# Patient Record
Sex: Female | Born: 1935 | Race: White | Hispanic: No | Smoking: Former smoker
Health system: Southern US, Community
[De-identification: ages and names within clinical notes are randomized; demographics above are authoritative.]

## PROBLEM LIST (undated history)

## (undated) DIAGNOSIS — I4819 Other persistent atrial fibrillation: Principal | ICD-10-CM

## (undated) DIAGNOSIS — I4821 Permanent atrial fibrillation: Secondary | ICD-10-CM

## (undated) DIAGNOSIS — I10 Essential (primary) hypertension: Secondary | ICD-10-CM

## (undated) DIAGNOSIS — D649 Anemia, unspecified: Secondary | ICD-10-CM

## (undated) DIAGNOSIS — K559 Vascular disorder of intestine, unspecified: Secondary | ICD-10-CM

## (undated) DIAGNOSIS — M549 Dorsalgia, unspecified: Secondary | ICD-10-CM

## (undated) DIAGNOSIS — H3530 Unspecified macular degeneration: Secondary | ICD-10-CM

## (undated) HISTORY — DX: Dorsalgia, unspecified: M54.9

## (undated) HISTORY — DX: Permanent atrial fibrillation: I48.21

## (undated) HISTORY — DX: Essential (primary) hypertension: I10

## (undated) HISTORY — PX: TOTAL ABDOMINAL HYSTERECTOMY: SHX209

## (undated) HISTORY — DX: Anemia, unspecified: D64.9

## (undated) HISTORY — DX: Other persistent atrial fibrillation: I48.19

## (undated) HISTORY — DX: Vascular disorder of intestine, unspecified: K55.9

## (undated) HISTORY — DX: Unspecified macular degeneration: H35.30

## (undated) HISTORY — PX: LAPAROSCOPIC SMALL BOWEL RESECTION: SUR793

---

## 1999-11-07 ENCOUNTER — Encounter: Payer: Self-pay | Admitting: Emergency Medicine

## 1999-11-07 ENCOUNTER — Emergency Department (HOSPITAL_COMMUNITY): Admission: EM | Admit: 1999-11-07 | Discharge: 1999-11-07 | Payer: Self-pay | Admitting: Emergency Medicine

## 2003-08-22 ENCOUNTER — Ambulatory Visit (HOSPITAL_COMMUNITY): Admission: RE | Admit: 2003-08-22 | Discharge: 2003-08-22 | Payer: Self-pay | Admitting: Family Medicine

## 2005-02-14 ENCOUNTER — Encounter: Admission: RE | Admit: 2005-02-14 | Discharge: 2005-02-14 | Payer: Self-pay | Admitting: Orthopaedic Surgery

## 2005-02-15 ENCOUNTER — Ambulatory Visit (HOSPITAL_BASED_OUTPATIENT_CLINIC_OR_DEPARTMENT_OTHER): Admission: RE | Admit: 2005-02-15 | Discharge: 2005-02-15 | Payer: Self-pay | Admitting: Orthopaedic Surgery

## 2005-02-15 ENCOUNTER — Ambulatory Visit (HOSPITAL_COMMUNITY): Admission: RE | Admit: 2005-02-15 | Discharge: 2005-02-15 | Payer: Self-pay | Admitting: Orthopaedic Surgery

## 2005-07-29 ENCOUNTER — Ambulatory Visit: Payer: Self-pay | Admitting: Internal Medicine

## 2007-06-04 ENCOUNTER — Emergency Department: Payer: Self-pay | Admitting: Emergency Medicine

## 2007-08-20 ENCOUNTER — Ambulatory Visit: Payer: Self-pay | Admitting: Internal Medicine

## 2007-08-25 ENCOUNTER — Ambulatory Visit: Payer: Self-pay

## 2008-10-31 ENCOUNTER — Ambulatory Visit: Payer: Self-pay | Admitting: Podiatry

## 2009-05-20 ENCOUNTER — Ambulatory Visit: Payer: Self-pay | Admitting: Family Medicine

## 2009-05-20 DIAGNOSIS — R5381 Other malaise: Secondary | ICD-10-CM

## 2009-05-20 DIAGNOSIS — R197 Diarrhea, unspecified: Secondary | ICD-10-CM

## 2009-05-20 DIAGNOSIS — F438 Other reactions to severe stress: Secondary | ICD-10-CM

## 2009-05-20 DIAGNOSIS — R55 Syncope and collapse: Secondary | ICD-10-CM | POA: Insufficient documentation

## 2009-05-20 DIAGNOSIS — R5383 Other fatigue: Secondary | ICD-10-CM

## 2010-02-10 ENCOUNTER — Telehealth (INDEPENDENT_AMBULATORY_CARE_PROVIDER_SITE_OTHER): Payer: Self-pay | Admitting: *Deleted

## 2010-05-10 ENCOUNTER — Ambulatory Visit: Payer: Self-pay | Admitting: Family Medicine

## 2010-05-11 LAB — CONVERTED CEMR LAB
ALT: 14 units/L (ref 0–35)
AST: 19 units/L (ref 0–37)
Albumin: 3.7 g/dL (ref 3.5–5.2)
Alkaline Phosphatase: 71 units/L (ref 39–117)
BUN: 14 mg/dL (ref 6–23)
Basophils Absolute: 0 10*3/uL (ref 0.0–0.1)
Basophils Relative: 0.4 % (ref 0.0–3.0)
Bilirubin, Direct: 0.1 mg/dL (ref 0.0–0.3)
CO2: 28 meq/L (ref 19–32)
Calcium: 8.9 mg/dL (ref 8.4–10.5)
Chloride: 108 meq/L (ref 96–112)
Cholesterol: 176 mg/dL (ref 0–200)
Creatinine, Ser: 0.9 mg/dL (ref 0.4–1.2)
Eosinophils Absolute: 0.2 10*3/uL (ref 0.0–0.7)
Eosinophils Relative: 2.9 % (ref 0.0–5.0)
GFR calc non Af Amer: 62.66 mL/min (ref >60)
Glucose, Bld: 102 mg/dL — ABNORMAL HIGH (ref 70–99)
HCT: 37.8 % (ref 36.0–46.0)
HDL: 63.7 mg/dL (ref >39.00)
Hemoglobin: 12.7 g/dL (ref 12.0–15.0)
LDL Cholesterol: 103 mg/dL — ABNORMAL HIGH (ref 0–99)
Lymphocytes Relative: 33.8 % (ref 12.0–46.0)
Lymphs Abs: 2 10*3/uL (ref 0.7–4.0)
MCHC: 33.6 g/dL (ref 30.0–36.0)
MCV: 81.2 fL (ref 78.0–100.0)
Monocytes Absolute: 0.6 10*3/uL (ref 0.1–1.0)
Monocytes Relative: 10.2 % (ref 3.0–12.0)
Neutro Abs: 3 10*3/uL (ref 1.4–7.7)
Neutrophils Relative %: 52.7 % (ref 43.0–77.0)
Platelets: 199 10*3/uL (ref 150.0–400.0)
Potassium: 4.4 meq/L (ref 3.5–5.1)
RBC: 4.66 M/uL (ref 3.87–5.11)
RDW: 15 % — ABNORMAL HIGH (ref 11.5–14.6)
Sodium: 145 meq/L (ref 135–145)
TSH: 1.5 microintl units/mL (ref 0.35–5.50)
Total Bilirubin: 0.6 mg/dL (ref 0.3–1.2)
Total CHOL/HDL Ratio: 3
Total Protein: 6.3 g/dL (ref 6.0–8.3)
Triglycerides: 45 mg/dL (ref 0.0–149.0)
VLDL: 9 mg/dL (ref 0.0–40.0)
WBC: 5.8 10*3/uL (ref 4.5–10.5)

## 2010-05-13 ENCOUNTER — Ambulatory Visit: Payer: Self-pay | Admitting: Family Medicine

## 2010-05-13 ENCOUNTER — Encounter (INDEPENDENT_AMBULATORY_CARE_PROVIDER_SITE_OTHER): Payer: Self-pay | Admitting: *Deleted

## 2010-05-13 LAB — CONVERTED CEMR LAB: Fecal Occult Bld: NEGATIVE

## 2010-05-20 ENCOUNTER — Encounter (INDEPENDENT_AMBULATORY_CARE_PROVIDER_SITE_OTHER): Payer: Self-pay | Admitting: *Deleted

## 2010-06-04 ENCOUNTER — Encounter: Payer: Self-pay | Admitting: Family Medicine

## 2010-06-12 LAB — HM DEXA SCAN

## 2010-07-12 ENCOUNTER — Telehealth: Payer: Self-pay | Admitting: Family Medicine

## 2010-08-02 ENCOUNTER — Ambulatory Visit: Payer: Self-pay | Admitting: Family Medicine

## 2010-08-02 DIAGNOSIS — M549 Dorsalgia, unspecified: Secondary | ICD-10-CM | POA: Insufficient documentation

## 2010-10-10 LAB — CONVERTED CEMR LAB
ALT: 12 units/L (ref 0–35)
AST: 20 units/L (ref 0–37)
Albumin: 3.7 g/dL (ref 3.5–5.2)
Alkaline Phosphatase: 81 units/L (ref 39–117)
BUN: 12 mg/dL (ref 6–23)
Basophils Absolute: 0 10*3/uL (ref 0.0–0.1)
Basophils Relative: 0.6 % (ref 0.0–3.0)
Bilirubin, Direct: 0.1 mg/dL (ref 0.0–0.3)
CO2: 31 meq/L (ref 19–32)
Calcium: 9.2 mg/dL (ref 8.4–10.5)
Chloride: 104 meq/L (ref 96–112)
Creatinine, Ser: 0.9 mg/dL (ref 0.4–1.2)
Direct LDL: 101.2 mg/dL
Eosinophils Absolute: 0.2 10*3/uL (ref 0.0–0.7)
Eosinophils Relative: 3 % (ref 0.0–5.0)
GFR calc non Af Amer: 63.61 mL/min (ref >60)
Glucose, Bld: 94 mg/dL (ref 70–99)
HCT: 38.3 % (ref 36.0–46.0)
Hemoglobin: 12.9 g/dL (ref 12.0–15.0)
Lymphocytes Relative: 41.5 % (ref 12.0–46.0)
Lymphs Abs: 3 10*3/uL (ref 0.7–4.0)
MCHC: 33.7 g/dL (ref 30.0–36.0)
MCV: 80.4 fL (ref 78.0–100.0)
Monocytes Absolute: 0.8 10*3/uL (ref 0.1–1.0)
Monocytes Relative: 10.9 % (ref 3.0–12.0)
Neutro Abs: 3.2 10*3/uL (ref 1.4–7.7)
Neutrophils Relative %: 44 % (ref 43.0–77.0)
Platelets: 210 10*3/uL (ref 150.0–400.0)
Potassium: 4 meq/L (ref 3.5–5.1)
RBC: 4.77 M/uL (ref 3.87–5.11)
RDW: 14 % (ref 11.5–14.6)
Sodium: 141 meq/L (ref 135–145)
TSH: 1.21 microintl units/mL (ref 0.35–5.50)
Total Bilirubin: 0.5 mg/dL (ref 0.3–1.2)
Total Protein: 6.8 g/dL (ref 6.0–8.3)
WBC: 7.2 10*3/uL (ref 4.5–10.5)

## 2010-10-12 NOTE — Miscellaneous (Signed)
  Clinical Lists Changes  Orders: Added new Referral order of Dexa Scan (Dexa Scan) - Signed  Appended Document:  DEXA Hannah Beat MD  May 20, 2010 10:36 AM    Clinical Lists Changes  Orders: Added new Referral order of Radiology Referral (Radiology) - Signed

## 2010-10-12 NOTE — Assessment & Plan Note (Signed)
Summary: LOWER BACK PAIN CYD   Vital Signs:  Patient profile:   77 year old female Height:      53 inches Weight:      167.50 pounds BMI:     42.08 Temp:     98.5 degrees F oral Pulse rate:   84 / minute Pulse rhythm:   regular BP sitting:   154 / 96  (left arm) Cuff size:   large  Vitals Entered By: Delilah Shan CMA Duncan Dull) (August 02, 2010 2:41 PM) CC: LBP   History of Present Illness: Low back pain.  L lower side. Radiates down to your hip.  No weakness, able to bear weight.  Worse bending over.  No pain reaching up.  No pain twisting.  Standing all day at work, but feels better standing and sitting, but worse after trying to get up out of the chair.  Present intermittently for a year, prev noted when caring for her husband before he died.  Better after taking a hot bath.   Allergies: 1)  ! * Tetanus 2)  ! Fosamax  Social History: Widowed after 64 years working some at [employer] Alcohol use-no Drug use-no Regular exercise-no Active in the [employer]  Review of Systems       See HPI.  Otherwise negative.    Physical Exam  General:  no apparent distress regular rate and rhythm clear to auscultation bilaterally back w/o midline pain L lower paraspinal muscles tender to palpation.  No pain with int/ext rotation of hips.  SLR neg bilaterally   Impression & Recommendations:  Problem # 1:  BACK PAIN (ICD-724.5) Likley muscle strain.  Supportive tx.  follow up as needed.  Okay to use local heat.  No indication for imaging.   Patient Instructions: 1)  I think you have a muscle strain.  I would use the heating pad, but be careful not to get burned.  Take the tylenol as needed and let us know if you aren't getting better.  Take care.  Glad to see you today.    Orders Added: 1)  Est. Patient Level III [52841]    Current Allergies (reviewed today): ! * TETANUS ! FOSAMAX

## 2010-10-12 NOTE — Assessment & Plan Note (Signed)
Summary: CPX/CLE  0 CO-PAY PER [employer]   Vital Signs:  Patient profile:   77 year old female Height:      53 inches Weight:      167.2 pounds BMI:     42.00 Temp:     97.9 degrees F oral Pulse rate:   76 / minute Pulse rhythm:   regular BP sitting:   120 / 78  (left arm) Cuff size:   regular  Vitals Entered By: Benny Lennert CMA Duncan Dull) (May 10, 2010 8:18 AM)  History of Present Illness: Chief complaint cpx   Mammo - had one year ago. declines DEXA scan: ok labs pneumovax: ok zoster, ok Tdap (ALLERGIC) Flu shot: ok FOBT  leg cramps constipated intermittently   dealing fairly well with loss of husband, sad sometimes  Contraindications/Deferment of Procedures/Staging:    Test/Procedure: Zoster vaccine    Reason for deferment: declined     Test/Procedure: Mammogram    Reason for deferment: patient declined     Test/Procedure: Colonoscopy    Reason for deferment: patient declined   Preventive Screening-Counseling & Management  Alcohol-Tobacco     Alcohol Counseling: not indicated; use of alcohol is not excessive or problematic     Smoking Status: quit     Tobacco Counseling: not indicated; no tobacco use  Caffeine-Diet-Exercise     Does Patient Exercise: no     Exercise Counseling: to improve exercise regimen  Hep-HIV-STD-Contraception     HIV Risk: no risk noted     STD Risk: no risk noted      Sexual History:  widowed last year.        Drug Use:  never.    Allergies (verified): 1)  ! * Tetanus  Social History: Married, Angelita Harnack working some at [employer] Alcohol use-no Drug use-no Regular exercise-no Active in the [employer] ChurchHIV Risk:  no risk noted STD Risk:  no risk noted Sexual History:  widowed last year Drug Use:  never Smoking Status:  quit  Review of Systems  General: Denies fever, chills, sweats, anorexia, fatigue, weakness, malaise Eyes: Denies blurring, vision loss ENT: Denies earache, nasal congestion, nosebleeds, sore  throat, and hoarseness.  Cardiovascular: Denies chest pains, palpitations, syncope, dyspnea on exertion,  Respiratory: Denies cough, dyspnea at rest, excessive sputum,wheeezing GI: occ constipation GU: Denies dysuria, hematuria, discharge, urinary frequency, urinary hesitancy, nocturia, incontinence, genital sores, decreased libido Musculoskeletal: Denies back pain, joint pain Derm: Denies rash, itching Neuro: Denies  paresthesias, frequent falls, frequent headaches, and difficulty walking.  Psych: Denies depression, anxiety Endocrine: Denies cold intolerance, heat intolerance, polydipsia, polyphagia, polyuria, and unusual weight change.  Heme: Denies enlarged lymph nodes Allergy: No hayfever   Otherwise, the pertinent positives and negatives are listed above and in the HPI, otherwise a full review of systems has been reviewed and is negative unless noted positive.   Physical Exam  General:  Well-developed,well-nourished,in no acute distress; alert,appropriate and cooperative throughout examination Head:  Normocephalic and atraumatic without obvious abnormalities. No apparent alopecia or balding. Eyes:  pupils equal, pupils round, pupils reactive to light, pupils react to accomodation, corneas and lenses clear, and no injection.   Ears:  External ear exam shows no significant lesions or deformities.  Otoscopic examination reveals clear canals, tympanic membranes are intact bilaterally without bulging, retraction, inflammation or discharge. Hearing is grossly normal bilaterally. Nose:  External nasal examination shows no deformity or inflammation. Nasal mucosa are pink and moist without lesions or exudates. Mouth:  Oral mucosa and oropharynx without  lesions or exudates.  Teeth in good repair. Neck:  No deformities, masses, or tenderness noted. Chest Wall:  No deformities, masses, or tenderness noted. Lungs:  Normal respiratory effort, chest expands symmetrically. Lungs are clear to  auscultation, no crackles or wheezes. Heart:  Normal rate and regular rhythm. S1 and S2 normal without gallop, murmur, click, rub or other extra sounds. Abdomen:  Bowel sounds positive,abdomen soft and non-tender without masses, organomegaly or hernias noted. Msk:  normal ROM.   Pulses:  DP and PT 2+ Extremities:  No clubbing, cyanosis, edema, or deformity noted with normal full range of motion of all joints.   Neurologic:  alert & oriented X3 and gait normal.   Skin:  Intact without suspicious lesions or rashes Cervical Nodes:  No lymphadenopathy noted Psych:  Cognition and judgment appear intact. Alert and cooperative with normal attention span and concentration. No apparent delusions, illusions, hallucinations   Impression & Recommendations:  Problem # 1:  HEALTH MAINTENANCE EXAM (ICD-V70.0) The patient's preventative maintenance and recommended screening tests for an annual wellness exam were reviewed in full today. Brought up to date unless services declined.  Counselled on the importance of diet, exercise, and its role in overall health and mortality. The patient's FH and SH was reviewed, including their home life, tobacco status, and drug and alcohol status.   update all vaccines DEXA scan  Other Orders: Radiology Referral (Radiology) Venipuncture [phone]) TLB-Lipid Panel (80061-LIPID) TLB-BMP (Basic Metabolic Panel-BMET) (80048-METABOL) TLB-CBC Platelet - w/Differential (85025-CBCD) TLB-Hepatic/Liver Function Pnl (80076-HEPATIC) TLB-TSH (Thyroid Stimulating Hormone) (84443-TSH) Flu Vaccine 51yrs + (98119) Administration Flu vaccine - MCR (G0008) Pneumococcal Vaccine (14782) Admin 1st Vaccine (95621) Admin of Any Addtl Vaccine (30865)  Patient Instructions: 1)  CONSTIPATION 2)  Difficult, uncomfortable, infrequent BM 3)  1. Warm prune juice, hot water, tea, coffee, apple juice 4)  2. Prevention: drink 8 glasses water daily, FIBER (raw fruit, veggies, bran cereal, whole  grains), regular exercise 5)  3. Bulk formers like Metamucil (psyllium), Citrucel (methylcellulose) usually help - oR FIBERCON 6)  4. Stool softeners (Docusate) occaisionally OK 7)  5. Occaisional over the counter Miralax usually safe 8)  6. Overuse of stimulant  laxatives (Ex-Lax, Mag Citrate, Dulcolax, etc.) can be habit forming    9)  GENTEAL REWETTING DROPS (FOR EYE DRYNESS) 10)  TRY SOME TONIC WATER AT NIGHT BEFORE BED, IT IS OK TO PUT SOME CRYSTAL LIGHT IN IT FOR TASTE  Current Allergies (reviewed today): ! * TETANUS    Prevention & Chronic Care Immunizations   Influenza vaccine: Fluvax 3+  (05/10/2010)   Influenza vaccine due: 08/19/2008    Tetanus booster: Not documented    Pneumococcal vaccine: Pneumovax (Medicare)  (05/10/2010)    H. zoster vaccine: Not documented   H. zoster vaccine deferral: declined  (05/10/2010)  Colorectal Screening   Hemoccult: Not documented   Hemoccult action/deferral: Ordered  (05/10/2010)    Colonoscopy: Not documented   Colonoscopy action/deferral: patient declined  (05/10/2010)  Other Screening   Pap smear: Not documented    Mammogram: Not documented   Mammogram action/deferral: patient declined  (05/10/2010)    DXA bone density scan: Not documented   DXA bone density action/deferral: Ordered  (05/10/2010)   Smoking status: quit  (05/10/2010)  Lipids   Total Cholesterol: Not documented   Lipid panel action/deferral: Lipid Panel ordered   LDL: Not documented   LDL Direct: 101.2  (05/20/2009)   HDL: Not documented   Triglycerides: Not documented   Nursing Instructions: Give tetanus  booster today Give Pneumovax today Provide Hemoccult cards with instructions (see order) Schedule screening DXA bone density scan (see order)          Flu Vaccine Consent Questions     Do you have a history of severe allergic reactions to this vaccine? no    Any prior history of allergic reactions to egg and/or gelatin? no    Do you  have a sensitivity to the preservative Thimersol? no    Do you have a past history of Guillan-Barre Syndrome? no    Do you currently have an acute febrile illness? no    Have you ever had a severe reaction to latex? no    Vaccine information given and explained to patient? yes    Are you currently pregnant? no    Lot Number:[id number]   Exp Date:03/12/2011   Manufacturer: [employer]    Site Given  Left Deltoid IMflu    Immunizations Administered:  Pneumonia Vaccine:    Vaccine Type: Pneumovax (Medicare)    Site: right deltoid    Mfr: Merck    Dose: 0.5 ml    Route: IM    Given by: Benny Lennert CMA (AAMA)    Exp. Date: 11/17/2011    Lot #: [id number]    VIS given: 04/09/96 version given May 10, 2010.

## 2010-10-12 NOTE — Progress Notes (Signed)
Summary: Can not take Fosamax  Phone Note Call from Patient Call back at [phone]   Caller: Patient Call For: Paula Beat MD Summary of Call: Patient was given Fosamax and she wants to let you know that she can not take this. She took the first dose on Friday and she felt full all day  and was weak and that night she collapsed on the floor and passed out. Today is the first day since taking the medication that she has felt almost normal again. Please advise what she should do. Pharmacy- [facility]/ [address] Initial call taken by: Sydell Axon LPN,  July 12, 2010 1:30 PM  Follow-up for Phone Call        That would be uncommon, but stop that medication.  Be sure to be taking Calcium 1500 units a day and vitamin d 2000 units a day Paula Beat MD  July 12, 2010 2:06 PM   Advised pt's daughter. Follow-up by: Lowella Petties CMA, AAMA,  July 12, 2010 3:12 PM   New Allergies: ! FOSAMAX New Allergies: ! FOSAMAX Prior Medications: ALENDRONATE SODIUM 70 MG TABS (ALENDRONATE SODIUM) 1 by mouth weekly on empty stomach, do not eat at least 30 mins after taking. Current Allergies: ! * TETANUS ! FOSAMAX

## 2010-10-12 NOTE — Letter (Signed)
Summary: [facility]: Immunoassay Fecal Occult Blood (iFOB) Order Form  [place] [facility]  [address] [place], [place] [address]   Phone: [phone]  Fax: [phone]      [facility]: Immunoassay Fecal Occult Blood (iFOB) Order Form   May 10, 2010 MRN: [id number]   Paula Luna 1925-07-17   Physicican Name:________Dr. Copland_________________  Diagnosis Code:__________v76.51 (screening neoplasm, colon)________________      Hannah Beat MD

## 2010-10-12 NOTE — Progress Notes (Signed)
Summary: Call  Phone Note Call from Patient Call back at [phone]   Caller: Patient Call For: [facility] Summary of Call: Patient asks that you return her call.   She wants to talk specifically to you but I'll call her back and insist that she give me the message if you like.  Lugene Fuquay CMA Duncan Dull)  February 10, 2010 4:22 PM   Follow-up for Phone Call        spoke to patient wanted to talk about bill for husband for a surgical shoe Follow-up by: Benny Lennert CMA Duncan Dull),  February 11, 2010 3:03 PM

## 2010-10-12 NOTE — Letter (Signed)
Summary: Results Follow up Letter  [place] [facility]  [address] [place], [place] [address]   Phone: [phone]  Fax: [phone]    05/13/2010 MRN: [id number]     Paula Luna [address] [place], [place]  [address]     Dear Ms. Sem,  The following are the results of your recent test(s):  Test         Result    Pap Smear:        Normal _____  Not Normal _____ Comments: ______________________________________________________ Cholesterol: LDL(Bad cholesterol):         Your goal is less than:         HDL (Good cholesterol):       Your goal is more than: Comments:  ______________________________________________________ Mammogram:        Normal _____  Not Normal _____ Comments:  ___________________________________________________________________ Hemoccult:        Normal __x___  Not normal _______ Comments:  Repeat in 1 year  _____________________________________________________________________ Other Tests:    We routinely do not discuss normal results over the telephone.  If you desire a copy of the results, or you have any questions about this information we can discuss them at your next office visit.   Sincerely,   Hannah Beat MD

## 2011-01-28 NOTE — Op Note (Signed)
NAMEMEILY, GLOWACKI              ACCOUNT NO.:  [id number]   MEDICAL RECORD NO.:  [id number]          PATIENT TYPE:  AMB   LOCATION:  [facility]SC                          FACILITY:  [facility]   PHYSICIAN:  Lubertha Basque. Dalldorf, M.D.DATE OF BIRTH:  [birth year]   DATE OF PROCEDURE:  02/15/2005  DATE OF DISCHARGE:                                 OPERATIVE REPORT   PREOPERATIVE DIAGNOSES:  1. Right shoulder impingement.  2. Right shoulder acromioclavicular arthropathy.  3. Right shoulder rotator cuff tear.   POSTOPERATIVE DIAGNOSES:  1. Right shoulder impingement.  2. Right shoulder acromioclavicular arthropathy.  3. Right shoulder rotator cuff tear.   PROCEDURES:  1. Right shoulder arthroscopic acromioplasty.  2. Right shoulder arthroscopic acromioclavicular resection.  3. Right shoulder arthroscopic debridement of rotator cuff and biceps.   ATTENDING SURGEON:  Lubertha Basque. Jerl Santos, M.D.   ASSISTANT:  Lindwood Qua, P.A.   ANESTHESIA:  General and shoulder block.   INDICATIONS FOR PROCEDURE:  The patient is a 77 year old woman who we have  followed for 3 or 4 years for right shoulder difficulty which stems from an  injury at [employer], where she works.  She has persisted with difficulty  despite multiple subacromial injections, home exercise program, supervised  physical therapy, oral anti-inflammatories, and narcotic pain medicine.  She  underwent an MRI scan about 3 years ago which showed a rotator cuff tear and  things consistent with impingement and AC arthropathy.  She has managed  fairly well with an injection and therapy every few months, but at this  point has pain which is incapacitating.  This makes it difficult for her  rest of night and difficult for her to use her arm for any meaningful  purpose.  She is offered an arthroscopy.  Informed operative consent was  obtained after discussion of possible applications of reaction to anesthesia  and infection.   DESCRIPTION OF  PROCEDURE:  The patient was taken to an operating suite where  a general anesthetic was applied without difficulty.  She was also given a  block in the preanesthesia area.  She was positioned in a beach-chair  position and prepped and draped in normal sterile fashion.  After the  administration of preop IV antibiotic, the right shoulder was addressed with  an arthroscopy through a total of 3 portals.  Glenohumeral joint showed no  degenerative changes, other than a small area on the humeral head about half  the size of a dime, which was grade 3.  Labral structures were intact.  The  biceps tendon had a degenerative area as it exited through the rotator cuff  and this was addressed with a debridement, but it involve only about 10% of  the thickness of this tendon.  The rotator cuff appeared to be abnormal and  at least partially torn from below.  In the subacromial space, she had a  great deal of bursitis addressed with a thorough bursectomy.  She had things  consistent with impingement and an acromioplasty was done with the bur in a  lateral position followed by transfer of the bur to the posterior position.  She also appeared to have bony contact on the rotator cuff under the [facility]  joint and a formal AC decompression was done through the anterior portal.  The rotator cuff was examined and no obvious full-thickness tears could be  seen, though there were several areas of degeneration and significant  partial-thickness tearing.  It was my feeling that there was not one focal  area to repair and that our best measured was to simply perform the  decompression and rehabilitate her shoulder.  After a debridement was  performed, the arthroscopic equipment was removed.  The shoulder was  thoroughly irrigated at the end of the case.  Her portals were loosely  reapproximated with nylon.  Adaptic was applied over the wounds followed by  dry gauze and tape.  Estimated blood loss and intraoperative fluids  can be  obtained from anesthesia records.   DISPOSITION:  The patient was extubated in the operating room and taken to  recovery in stable addition.  Plans were for her to go home the same day and  to follow up in the office in less than a week.  I will contact her by phone  tonight.       PGD/MEDQ  D:  02/15/2005  T:  02/15/2005  Job:  [id number]

## 2011-11-07 ENCOUNTER — Encounter: Payer: Self-pay | Admitting: Family Medicine

## 2011-11-07 ENCOUNTER — Ambulatory Visit (INDEPENDENT_AMBULATORY_CARE_PROVIDER_SITE_OTHER): Payer: BC Managed Care – PPO | Admitting: Family Medicine

## 2011-11-07 DIAGNOSIS — J069 Acute upper respiratory infection, unspecified: Secondary | ICD-10-CM | POA: Insufficient documentation

## 2011-11-07 NOTE — Progress Notes (Signed)
  Subjective:    Patient ID: Paula Luna, female    DOB: Oct 23, 1924, 78 y.o.   MRN: [id number]  HPI CC: congestion  Presents with daughter.  Overall very healthy 78yo with 2d h/o sinus congestion sensation and that cleared with throat lozenges.  Feeling mild congestion in chest.  Has had colds come and go the last 2 weeks so decided she wanted to come be evaluated.  Mild dizziness this morning.  Works at [employer] - cold breeze.  Taking ibuprofen and fisherman's lozenges.  No fevers/chills, PNdrainage, RN, sneezing, headaches, ear pain or gum pain, rashes, abd pain, n/v.  No chest pain, shorness of breath.  No sick contacts at home.  No smokers at home.  No h/o asthma, allergic rhinitis.  bp elevated today - stressed because sister in law is critically ill in [place].  Review of Systems Per HPI    Objective:   Physical Exam  Nursing note and vitals reviewed. Constitutional: She appears well-developed and well-nourished. No distress.  HENT:  Head: Normocephalic and atraumatic.  Right Ear: Hearing, external ear and ear canal normal.  Left Ear: Hearing, external ear and ear canal normal.  Nose: Nose normal. No mucosal edema or rhinorrhea. Right sinus exhibits no maxillary sinus tenderness and no frontal sinus tenderness. Left sinus exhibits no maxillary sinus tenderness and no frontal sinus tenderness.  Mouth/Throat: Uvula is midline, oropharynx is clear and moist and mucous membranes are normal. No oropharyngeal exudate, posterior oropharyngeal edema, posterior oropharyngeal erythema or tonsillar abscesses.       TMs covered by cerumen  Eyes: Conjunctivae and EOM are normal. Pupils are equal, round, and reactive to light. No scleral icterus.  Neck: Normal range of motion. Neck supple.  Cardiovascular: Normal rate, regular rhythm, normal heart sounds and intact distal pulses.   No murmur heard. Pulmonary/Chest: Effort normal and breath sounds normal. No respiratory distress. She has no  wheezes. She has no rales.  Lymphadenopathy:    She has no cervical adenopathy.  Skin: Skin is warm and dry. No rash noted.       Assessment & Plan:

## 2011-11-07 NOTE — Patient Instructions (Addendum)
You are doing well today. You may be getting over a viral infection Push fluids and plenty of rest. Nasal saline irrigation or neti pot to help drain sinuses. May use simple mucinex (or guaifenesin immediate release) with plenty of fluid to help mobilize mucous. Let us know if fever >101.5, trouble opening/closing mouth, difficulty swallowing, or worsening productive cough - you may need to be seen again.

## 2011-11-07 NOTE — Assessment & Plan Note (Signed)
Exam normal today. Advised to keep eye on temperature and if any worsening to let us know.

## 2012-07-12 ENCOUNTER — Ambulatory Visit (INDEPENDENT_AMBULATORY_CARE_PROVIDER_SITE_OTHER): Payer: BC Managed Care – PPO | Admitting: Family Medicine

## 2012-07-12 ENCOUNTER — Encounter: Payer: Self-pay | Admitting: Family Medicine

## 2012-07-12 VITALS — BP 152/82 | HR 76 | Temp 98.2°F | Wt 164.5 lb

## 2012-07-12 DIAGNOSIS — IMO0002 Reserved for concepts with insufficient information to code with codable children: Secondary | ICD-10-CM

## 2012-07-12 DIAGNOSIS — Z23 Encounter for immunization: Secondary | ICD-10-CM

## 2012-07-12 DIAGNOSIS — S76019A Strain of muscle, fascia and tendon of unspecified hip, initial encounter: Secondary | ICD-10-CM

## 2012-07-12 NOTE — Patient Instructions (Addendum)
Hip Rehab:  Hip Flexion: Toe up to ceiling, laying on your back. Lift your whole leg, 3 sets. Work up to being able to do #30 with each set.  Hip elevations, Toe and leg turned out to side.  Lift whole leg, 3 sets. Work up to being able to do #30 with each set.  Hip Abductions: Lying on side, straight out to side. 3 sets, work up to being able to do #30 with each set.  At the beginning you may only be able to do a lot less, try to do #10.  

## 2012-07-12 NOTE — Progress Notes (Signed)
Nature conservation officer at [facility] [address] [place] [address] Phone: [phone] Fax: [phone]  Date:  07/12/2012   Name:  Paula Luna   DOB:  October 12, 1924   MRN:  [id number] Gender: female Age: 78 y.o.  PCP:  Hannah Beat, MD  Evaluating MD: Hannah Beat, MD   Chief Complaint: Groin Pain   History of Present Illness:  Paula Luna is a 78 y.o. pleasant patient who presents with the following:  Larey Seat a couple of weeks ago, granddaughter won a Psychologist, prison and probation services. Paula Luna was on the other side, was walking to side and roled to the side. Now pain on the side, and pain on the side and back. In the groin - more in the back.   R side with pain in the groin and in the posterior buttocks. No numbness, tingling, but pain with walking, standing, moving and she still works at [employer]. (pain)  Patient Active Problem List  Diagnosis  . OTHER ACUTE REACTIONS TO STRESS  . BACK PAIN  . VASOVAGAL SYNCOPE  . FATIGUE  . DIARRHEA  . Viral URI    Past Medical History  Diagnosis Date  . Osteoporosis     Past Surgical History  Procedure Date  . Total abdominal hysterectomy     History  Substance Use Topics  . Smoking status: Former Games developer  . Smokeless tobacco: Not on file  . Alcohol Use: No    No family history on file.  Allergies  Allergen Reactions  . Latex Rash  . Alendronate Sodium     REACTION: passed out  . Tetanus Toxoid     REACTION: anaphylaxis    Medication list has been reviewed and updated.  Outpatient Prescriptions Prior to Visit  Medication Sig Dispense Refill  . ibuprofen (ADVIL,MOTRIN) 200 MG tablet Take 200 mg by mouth as needed.        Review of Systems:   GEN: No fevers, chills. Nontoxic. Primarily MSK c/o today. MSK: Detailed in the HPI GI: tolerating PO intake without difficulty Neuro: No numbness, parasthesias, or tingling associated. Otherwise the pertinent positives of the ROS are noted above.    Physical  Examination: Filed Vitals:   07/12/12 1129  BP: 152/82  Pulse: 76  Temp: 98.2 F (36.8 C)  TempSrc: Oral  Weight: 164 lb 8 oz (74.617 kg)  SpO2: 99%    There is no height on file to calculate BMI. Ideal Body Weight:     GEN: WDWN, NAD, Non-toxic, Alert & Oriented x 3 HEENT: Atraumatic, Normocephalic.  Ears and Nose: No external deformity. EXTR: No clubbing/cyanosis/edema NEURO: antalgic gait.  PSYCH: Normally interactive. Conversant. Not depressed or anxious appearing.  Calm demeanor.   HIP EXAM: SIDE: R ROM: Abduction, Flexion, Internal and External range of motion: full Pain with terminal IROM and EROM: none GTB: NT SLR: NEG Knees: No effusion FABER: NT REVERSE FABER: NT, neg Piriformis: NT at direct palpation Str: flexion: 5/5 abduction: 4-/5 adduction: 3+/5 Abduction and adduction are notably tender     Assessment and Plan:  1. Strain of hip adductor muscle   2. Need for prophylactic vaccination and inoculation against influenza    Reassured. Bone contusion, soft tissue contusion with groin strain and hib abductor discomfort advil 1-2 tabs po q 8 hours ROM and str reviewed  Orders Today:  No orders of the defined types were placed in this encounter.    Updated Medication List: (Includes new medications, updates to list, dose adjustments) No orders of the defined  types were placed in this encounter.    Medications Discontinued: There are no discontinued medications.   Hannah Beat, MD

## 2012-07-16 ENCOUNTER — Telehealth: Payer: Self-pay

## 2012-07-16 ENCOUNTER — Telehealth: Payer: Self-pay | Admitting: Family Medicine

## 2012-07-16 ENCOUNTER — Encounter: Payer: Self-pay | Admitting: Family Medicine

## 2012-07-16 NOTE — Telephone Encounter (Signed)
Note changed.  Spoke with pt and told her note will be waiting for her at front desk.

## 2012-07-16 NOTE — Telephone Encounter (Signed)
Please do work note, may return 07/23/2012.

## 2012-07-16 NOTE — Telephone Encounter (Signed)
Pt seen 07/12/12; went to work 07/13/12 and was in horrible pain. Needs note to be out of work starting 07/14/12 thru whenever Dr Patsy Lager thinks pt could return to work. Pt has more pain in rt groin upon weight bearing(pt walking with cane). Pt has been doing exercises given.Please advise when letter ready for pick up.

## 2012-07-16 NOTE — Telephone Encounter (Signed)
Noted, i think that is reasonable in an 78 year old.  Can you please change the note until 07/30/2012 return to work.

## 2012-07-16 NOTE — Telephone Encounter (Signed)
Pt is calling back to say that she needs a note for missing 14 days. Pt had called earlier and spoke with the nurse. She does not need a note for 1-2 days . She cannot stand because of the groin injury. She states that the MD had advised her that he would give her a note for this long. Pt did try to go to work on Friday but it was too painful. Please call the pt when this is ready/she has to have someone else come and pick it up.

## 2012-07-16 NOTE — Telephone Encounter (Signed)
Left message for pt to pick up work note.

## 2012-07-20 ENCOUNTER — Telehealth: Payer: Self-pay | Admitting: Family Medicine

## 2012-07-20 NOTE — Telephone Encounter (Signed)
Patient said you'll be receiving forms for her long term disability.  She has disability until 08/16/12.  Patient is asking for you to fill out form and fax back to the company.  Patient said when she puts her heel on the floor she feels pain,but when she sits down she's fine.  She wanted you to know she's doing her exercises.

## 2012-07-20 NOTE — Telephone Encounter (Signed)
Ok, and make sure she is taking 1 alleve twice a day until i see her, also

## 2012-07-23 ENCOUNTER — Telehealth: Payer: Self-pay | Admitting: Family Medicine

## 2012-07-23 NOTE — Telephone Encounter (Signed)
Please have the patient follow-up in the next 1-2 weeks - we need to reassess, and I cannot keep her out on indefinite long-term disability without following.

## 2012-07-23 NOTE — Telephone Encounter (Signed)
Caller: [name]/Patient; Patient Name: Paula Luna; PCP: Hannah Beat [facility]); Best Callback Phone Number: [phone] Wants to know if office has received paperwork from [facility] for disability from [place]. Was told 11-8 was faxing them to office. PLEASE CALL PATIENT AND ADVISE IF HAS RECEIVED DISABILITY FORMS

## 2012-07-23 NOTE — Telephone Encounter (Signed)
Patient advised we do have paperwork and will fax them when they are ready

## 2012-07-27 NOTE — Telephone Encounter (Signed)
Paula Luna can you schedule this please?

## 2012-08-01 ENCOUNTER — Encounter: Payer: Self-pay | Admitting: Family Medicine

## 2012-08-01 ENCOUNTER — Ambulatory Visit (INDEPENDENT_AMBULATORY_CARE_PROVIDER_SITE_OTHER): Payer: BC Managed Care – PPO | Admitting: Family Medicine

## 2012-08-01 VITALS — BP 130/80 | HR 78 | Temp 97.5°F | Ht <= 58 in | Wt 164.5 lb

## 2012-08-01 DIAGNOSIS — M25551 Pain in right hip: Secondary | ICD-10-CM

## 2012-08-01 DIAGNOSIS — M25559 Pain in unspecified hip: Secondary | ICD-10-CM

## 2012-08-01 NOTE — Progress Notes (Signed)
Nature conservation officer at [facility] [address] [place] [address] Phone: [phone] Fax: [phone]  Date:  08/01/2012   Name:  Paula Luna   DOB:  [birth year]   MRN:  [id number] Gender: female Age: 78 y.o.  PCP:  Hannah Beat, MD  Evaluating MD: Hannah Beat, MD   Chief Complaint: Follow-up   History of Present Illness:  Paula Luna is a 78 y.o. pleasant patient who presents with the following:  Pleasant elderly female  I remember well who presents in followup for RIGHT hip pain. On her last office visit, she had fallen about 2 weeks prior to mall office visit, and then she had continued to have some relatively debilitating posterior buttocks and hip pain. She was having a great deal of difficulty with walking and going up and downstairs. She was particularly having difficulty while being standing for any length of time, and difficulty with walking on hard surfaces.  At that time, she had some fairly remarkable strength deficiencies around her RIGHT hip. Pain with muscular contraction with abduction, flexion, and flexion at the knee. She was also having significant buttocks pain.  I gave her some hip rehabilitation, and we ultimately had to take her out of work due to pain and limitation. She is doing better compared to then, and is having minimal pain when she is lying down or sitting in a comfortable chair, but she is still having some significant pain with going up and down stairs, and standing or walking on hard surfaces.  Taking Motrin.  Patient Active Problem List  Diagnosis  . OTHER ACUTE REACTIONS TO STRESS  . BACK PAIN  . VASOVAGAL SYNCOPE  . FATIGUE  . DIARRHEA    Past Medical History  Diagnosis Date  . Osteoporosis     Past Surgical History  Procedure Date  . Total abdominal hysterectomy     History  Substance Use Topics  . Smoking status: Former Games developer  . Smokeless tobacco: Not on file  . Alcohol Use: No    No family history  on file.  Allergies  Allergen Reactions  . Latex Rash  . Alendronate Sodium     REACTION: passed out  . Tetanus Toxoid     REACTION: anaphylaxis    Medication list has been reviewed and updated.  Outpatient Prescriptions Prior to Visit  Medication Sig Dispense Refill  . ibuprofen (ADVIL,MOTRIN) 200 MG tablet Take 200 mg by mouth as needed.       Last reviewed on 08/01/2012  9:00 AM by Consuello Masse, CMA  Review of Systems:   GEN: No fevers, chills. Nontoxic. Primarily MSK c/o today. MSK: Detailed in the HPI GI: tolerating PO intake without difficulty Neuro: No numbness, parasthesias, or tingling associated. Otherwise the pertinent positives of the ROS are noted above.    Physical Examination: Filed Vitals:   08/01/12 0859  BP: 130/80  Pulse: 78  Temp: 97.5 F (36.4 C)  TempSrc: Oral  Height: 4\' 5"  (1.346 m)  Weight: 164 lb 8 oz (74.617 kg)    Body mass index is 41.17 kg/(m^2). Ideal Body Weight: Weight in (lb) to have BMI = 25: 99.7    GEN: WDWN, NAD, Non-toxic, Alert & Oriented x 3 HEENT: Atraumatic, Normocephalic.  Ears and Nose: No external deformity. EXTR: No clubbing/cyanosis/edema NEURO: Normal gait, mild antalgia PSYCH: Normally interactive. Conversant. Not depressed or anxious appearing.  Calm demeanor.   HIP EXAM: SIDE: R ROM: Abduction, Flexion, Internal and External range of motion:  full Pain with terminal IROM and EROM: something laterally with terminal external range of motion GTB: NT SLR: NEG Knees: No effusion FABER: something posteriorly and laterally, none at the SI joint and none of the back. REVERSE FABER: NT Piriformis: NT at direct palpation Str: flexion: 4++/5 abduction: 4+/5 adduction: 4++/5 HS: 4/5 Strength testing mild-mod-tender     Assessment and Plan:  1. Hip pain, right    She is doing quite a bit better compared to last office visit, but she still has some functional strength deficit that is improving. I think  a lot of her pain is secondary to insertion tendinopathy, and she likely got weak due to pain phenomenon.  She is unable to stand for a prolonged time period right now, or walk  For a prolonged time period, so we are going to keep her out of work for an additional 3 weeks. I am optimistic that she will be able to return to work at that time. Her strength is improving and her mobility is improving.  Of note, the patient is 78 years old. I discussed with the patient and her daughter that she ultimately may need to decrease her total hours worked weekly.  Orders Today:  No orders of the defined types were placed in this encounter.    Updated Medication List: (Includes new medications, updates to list, dose adjustments) No orders of the defined types were placed in this encounter.    Medications Discontinued: There are no discontinued medications.   Hannah Beat, MD

## 2012-08-16 ENCOUNTER — Telehealth: Payer: Self-pay | Admitting: *Deleted

## 2012-08-16 NOTE — Telephone Encounter (Signed)
Patient calling requesting an extension on her leave because of her leg pain. Patient says she only needs note to be faxed.

## 2012-08-19 NOTE — Telephone Encounter (Signed)
Please gather more information, she was supposed to be returning to work on light-duty capacity with some work limitations. It would seem reasonable to extend her work modifications. Is this what she is talking about?

## 2012-08-20 NOTE — Telephone Encounter (Signed)
Can you please call and explain to patient what you are going to do with her and her work limitations

## 2012-08-20 NOTE — Telephone Encounter (Signed)
She is supposed to return on the 10th but, she says she doesn't think she can go back right now.

## 2012-08-21 NOTE — Telephone Encounter (Signed)
Form faxed to company

## 2012-08-21 NOTE — Telephone Encounter (Signed)
I discussed all with her - paperwork completed ---- I put in my outbox. I think we were to fax it to her company.

## 2012-08-29 ENCOUNTER — Encounter: Payer: Self-pay | Admitting: Family Medicine

## 2012-08-29 ENCOUNTER — Telehealth: Payer: Self-pay | Admitting: Family Medicine

## 2012-08-29 ENCOUNTER — Ambulatory Visit (INDEPENDENT_AMBULATORY_CARE_PROVIDER_SITE_OTHER): Payer: BC Managed Care – PPO | Admitting: Family Medicine

## 2012-08-29 ENCOUNTER — Telehealth: Payer: Self-pay

## 2012-08-29 VITALS — BP 120/86 | HR 91 | Temp 97.7°F | Ht <= 58 in

## 2012-08-29 DIAGNOSIS — H6120 Impacted cerumen, unspecified ear: Secondary | ICD-10-CM

## 2012-08-29 DIAGNOSIS — M25559 Pain in unspecified hip: Secondary | ICD-10-CM

## 2012-08-29 NOTE — Telephone Encounter (Signed)
This has been taken care of at appt this morning

## 2012-08-29 NOTE — Progress Notes (Signed)
Nature conservation officer at [facility] [address] [place] [address] Phone: [phone] Fax: [phone]  Date:  08/29/2012   Name:  Paula Luna   DOB:  June 28, 1925   MRN:  [id number] Gender: female Age: 78 y.o.  PCP:  Hannah Beat, MD  Evaluating MD: Hannah Beat, MD   Chief Complaint: Cerumen Impaction   History of Present Illness:  Paula Luna is a 78 y.o. pleasant patient who presents with the following:  Seeing blurry with L eye.   Decreased hearing and concern about cerumen impaction, which happens commonly in this patient.   Continues to have R posterior hip pain, particularly with standing, and she is unable to stand for prolonged periods on the hard floor without pain. Str is improving and she is compliant with HEP. Overall, improving. She has been unable to return to work.   Patient Active Problem List  Diagnosis  . OTHER ACUTE REACTIONS TO STRESS  . BACK PAIN  . VASOVAGAL SYNCOPE  . FATIGUE  . DIARRHEA    Past Medical History  Diagnosis Date  . Osteoporosis     Past Surgical History  Procedure Date  . Total abdominal hysterectomy     History  Substance Use Topics  . Smoking status: Former Games developer  . Smokeless tobacco: Not on file  . Alcohol Use: No    No family history on file.  Allergies  Allergen Reactions  . Latex Rash  . Alendronate Sodium     REACTION: passed out  . Tetanus Toxoid     REACTION: anaphylaxis    Medication list has been reviewed and updated.  Outpatient Prescriptions Prior to Visit  Medication Sig Dispense Refill  . ibuprofen (ADVIL,MOTRIN) 200 MG tablet Take 200 mg by mouth as needed.       Last reviewed on 08/29/2012  8:05 AM by Consuello Masse, CMA  Review of Systems:  No fever, chills, sweats Some depression  Physical Examination: Filed Vitals:   08/29/12 0804  BP: 120/86  Pulse: 91  Temp: 97.7 F (36.5 C)  TempSrc: Oral  Height: 4\' 5"  (1.346 m)    There is no weight on file  to calculate BMI. Ideal Body Weight: Weight in (lb) to have BMI = 25: 99.7    GEN: WDWN, NAD, Non-toxic, Alert & Oriented x 3 HEENT: Atraumatic, Normocephalic.  Ears and Nose: No external deformity. Cerumen impaction. EXTR: No clubbing/cyanosis/edema NEURO: Normal gait.  PSYCH: Normally interactive. Conversant. Not depressed or anxious appearing.  Calm demeanor.   HIP EXAM: SIDE: B ROM: Abduction, Flexion, Internal and External range of motion: full Pain with terminal IROM and EROM: mild with terminal EROM GTB: NT SLR: NEG Knees: No effusion FABER: NT REVERSE FABER: NT, neg Piriformis: NT at direct palpation Str: flexion: 5/5 abduction: 5/5 adduction: 5/5 Strength testing non-tender     Assessment and Plan:  1. Cerumen impaction   2. Hip pain    Cerumen removal in office  R hip pain, on exam improving, str improved with much less pain. Functional limitation with standing, and she feels like she could not return to work standing as she does at work. RTW extended. Improving with str, certainly improved compared to initial encounter.  Some depression. + worsened sleep, some sadness, occ crying, no si or hi. Some anhedonia. Agrees depressed. Does not want any medication or other intervention.  Orders Today:  No orders of the defined types were placed in this encounter.    Updated Medication List: (Includes  new medications, updates to list, dose adjustments) No orders of the defined types were placed in this encounter.    Medications Discontinued: There are no discontinued medications.   Hannah Beat, MD

## 2012-08-29 NOTE — Telephone Encounter (Signed)
Please fax them my letter from today.

## 2012-08-29 NOTE — Telephone Encounter (Signed)
Letter faxed.

## 2012-08-29 NOTE — Telephone Encounter (Signed)
Pt received a letter from [employer] (her disability ins carrier thru her employer) stating they need an Attending Physician Statement from her provider in order to process her disability.  The only thing I've seen is the Return to Work Certification, which I faxed to [employer] on 08/23/2012. Do you have or have you seen the Attending Physician Statement? If not, I can call to see if they will fax it. Thank you.

## 2012-08-29 NOTE — Telephone Encounter (Signed)
Paula Luna with [employer] for [employer] disability services left v/m; information for work restrictions was received but needs to know how long pt will be on restrictions before pt can return to full duty; request this in writing to fax # provided.Please advise.

## 2012-10-08 ENCOUNTER — Other Ambulatory Visit (INDEPENDENT_AMBULATORY_CARE_PROVIDER_SITE_OTHER): Payer: BC Managed Care – PPO

## 2012-10-08 ENCOUNTER — Encounter: Payer: BC Managed Care – PPO | Admitting: Family Medicine

## 2012-10-08 DIAGNOSIS — Z13220 Encounter for screening for lipoid disorders: Secondary | ICD-10-CM

## 2012-10-08 DIAGNOSIS — R5381 Other malaise: Secondary | ICD-10-CM

## 2012-10-08 DIAGNOSIS — R5383 Other fatigue: Secondary | ICD-10-CM

## 2012-10-08 LAB — CBC WITH DIFFERENTIAL/PLATELET
Basophils Relative: 0.4 % (ref 0.0–3.0)
Eosinophils Absolute: 0.1 10*3/uL (ref 0.0–0.7)
Eosinophils Relative: 2.1 % (ref 0.0–5.0)
HCT: 38.5 % (ref 36.0–46.0)
Hemoglobin: 12.8 g/dL (ref 12.0–15.0)
Lymphs Abs: 2 10*3/uL (ref 0.7–4.0)
MCHC: 33.1 g/dL (ref 30.0–36.0)
MCV: 78.6 fl (ref 78.0–100.0)
Monocytes Absolute: 0.6 10*3/uL (ref 0.1–1.0)
Neutro Abs: 3.9 10*3/uL (ref 1.4–7.7)
RBC: 4.9 Mil/uL (ref 3.87–5.11)
WBC: 6.7 10*3/uL (ref 4.5–10.5)

## 2012-10-08 LAB — HEPATIC FUNCTION PANEL
ALT: 12 U/L (ref 0–35)
Albumin: 3.8 g/dL (ref 3.5–5.2)
Bilirubin, Direct: 0.1 mg/dL (ref 0.0–0.3)
Total Protein: 7.1 g/dL (ref 6.0–8.3)

## 2012-10-08 LAB — BASIC METABOLIC PANEL
BUN: 15 mg/dL (ref 6–23)
GFR: 69.28 mL/min (ref >60.00)
Potassium: 4 mEq/L (ref 3.5–5.1)

## 2012-10-08 LAB — LIPID PANEL
Cholesterol: 190 mg/dL (ref 0–200)
Triglycerides: 60 mg/dL (ref 0.0–149.0)

## 2012-10-15 ENCOUNTER — Encounter: Payer: Self-pay | Admitting: Family Medicine

## 2012-10-15 ENCOUNTER — Ambulatory Visit (INDEPENDENT_AMBULATORY_CARE_PROVIDER_SITE_OTHER): Payer: BC Managed Care – PPO | Admitting: Family Medicine

## 2012-10-15 VITALS — BP 130/70 | HR 72 | Temp 98.5°F | Ht <= 58 in | Wt 162.8 lb

## 2012-10-15 DIAGNOSIS — Z0000 Encounter for general adult medical examination without abnormal findings: Secondary | ICD-10-CM

## 2012-10-15 DIAGNOSIS — Z2911 Encounter for prophylactic immunotherapy for respiratory syncytial virus (RSV): Secondary | ICD-10-CM

## 2012-10-15 DIAGNOSIS — Z23 Encounter for immunization: Secondary | ICD-10-CM

## 2012-10-15 NOTE — Progress Notes (Signed)
Nature conservation officer at [facility] [address] [place] [address] Phone: [phone] Fax: [phone]  Date:  10/15/2012   Name:  Paula Luna   DOB:  07/07/1925   MRN:  [id number] Gender: female Age: 79 y.o.  Primary Physician:  Hannah Beat, MD  Evaluating MD: Hannah Beat, MD   Chief Complaint: Annual Exam   History of Present Illness:  Paula Luna is a 79 y.o. pleasant patient who presents with the following:  Zostavax - given.   Health Maintenance Summary Reviewed and updated, unless pt declines services.  Tobacco History Reviewed. Non-smoker Alcohol: No concerns, no excessive use Exercise Habits: working 8 hours a day now, walking much at [employer] STD concerns: none Drug Use: None n/a Birth control method: n/a Menses regular: n/a Lumps or breast concerns: no  Health Maintenance  Topic Date Due  . Tetanus/tdap  10/07/1945  . Colonoscopy  10/07/1976  . Influenza Vaccine  05/13/2013  . Pneumococcal Polysaccharide Vaccine Age 64 And Over  Completed  . Zostavax  Completed    Labs reviewed with the patient.  Results for orders placed in visit on 10/08/12  LIPID PANEL      Component Value Range   Cholesterol 190  0 - 200 mg/dL   Triglycerides 25.4  0.0 - 149.0 mg/dL   HDL 27.06  >23.76 mg/dL   VLDL 28.3  0.0 - 15.1 mg/dL   LDL Cholesterol 761 (*) 0 - 99 mg/dL   Total CHOL/HDL Ratio 3    TSH      Component Value Range   TSH 1.28  0.35 - 5.50 uIU/mL  CBC WITH DIFFERENTIAL      Component Value Range   WBC 6.7  4.5 - 10.5 K/uL   RBC 4.90  3.87 - 5.11 Mil/uL   Hemoglobin 12.8  12.0 - 15.0 g/dL   HCT 60.7  37.1 - 06.2 %   MCV 78.6  78.0 - 100.0 fl   MCHC 33.1  30.0 - 36.0 g/dL   RDW 69.4 (*) 85.4 - 62.7 %   Platelets 213.0  150.0 - 400.0 K/uL   Neutrophils Relative 58.0  43.0 - 77.0 %   Lymphocytes Relative 30.3  12.0 - 46.0 %   Monocytes Relative 9.2  3.0 - 12.0 %   Eosinophils Relative 2.1  0.0 - 5.0 %   Basophils Relative 0.4  0.0 -  3.0 %   Neutro Abs 3.9  1.4 - 7.7 K/uL   Lymphs Abs 2.0  0.7 - 4.0 K/uL   Monocytes Absolute 0.6  0.1 - 1.0 K/uL   Eosinophils Absolute 0.1  0.0 - 0.7 K/uL   Basophils Absolute 0.0  0.0 - 0.1 K/uL  HEPATIC FUNCTION PANEL      Component Value Range   Total Bilirubin 0.5  0.3 - 1.2 mg/dL   Bilirubin, Direct 0.1  0.0 - 0.3 mg/dL   Alkaline Phosphatase 73  39 - 117 U/L   AST 18  0 - 37 U/L   ALT 12  0 - 35 U/L   Total Protein 7.1  6.0 - 8.3 g/dL   Albumin 3.8  3.5 - 5.2 g/dL  BASIC METABOLIC PANEL      Component Value Range   Sodium 139  135 - 145 mEq/L   Potassium 4.0  3.5 - 5.1 mEq/L   Chloride 105  96 - 112 mEq/L   CO2 27  19 - 32 mEq/L   Glucose, Bld 96  70 - 99 mg/dL  BUN 15  6 - 23 mg/dL   Creatinine, Ser 0.8  0.4 - 1.2 mg/dL   Calcium 9.0  8.4 - 16.1 mg/dL   GFR 09.60  >45.40 mL/min     Patient Active Problem List  Diagnosis  . OTHER ACUTE REACTIONS TO STRESS  . BACK PAIN  . VASOVAGAL SYNCOPE  . FATIGUE  . DIARRHEA    Past Medical History  Diagnosis Date  . Osteoporosis     Past Surgical History  Procedure Date  . Total abdominal hysterectomy     History  Substance Use Topics  . Smoking status: Former Games developer  . Smokeless tobacco: Not on file  . Alcohol Use: No    No family history on file.  Allergies  Allergen Reactions  . Latex Rash  . Alendronate Sodium     REACTION: passed out  . Tetanus Toxoid     REACTION: anaphylaxis    Medication list has been reviewed and updated.  Outpatient Prescriptions Prior to Visit  Medication Sig Dispense Refill  . ibuprofen (ADVIL,MOTRIN) 200 MG tablet Take 200 mg by mouth as needed.       Last reviewed on 10/15/2012  2:36 PM by Consuello Masse, CMA  Review of Systems:   General: Denies fever, chills, sweats. No significant weight loss. Eyes: Denies blurring,significant itching ENT: Denies earache, sore throat, and hoarseness.  Cardiovascular: Denies chest pains, palpitations, dyspnea on exertion,    Respiratory: Denies cough, dyspnea at rest,wheeezing Breast: no concerns about lumps GI: Denies nausea, vomiting, diarrhea, constipation, change in bowel habits, abdominal pain, melena, hematochezia GU: Denies dysuria, hematuria, urinary hesitancy, nocturia, denies STD risk, no concerns about discharge Musculoskeletal: Denies back pain, joint pain Derm: Denies rash, itching Neuro: Denies  paresthesias, frequent falls, frequent headaches Psych: Denies depression, anxiety Endocrine: Denies cold intolerance, heat intolerance, polydipsia Heme: Denies enlarged lymph nodes Allergy: No hayfever   Physical Examination: BP 130/70  Pulse 72  Temp 98.5 F (36.9 C) (Oral)  Ht 4\' 5"  (1.346 m)  Wt 162 lb 12 oz (73.823 kg)  BMI 40.74 kg/m2  SpO2 98%  Ideal Body Weight: Weight in (lb) to have BMI = 25: 99.7    GEN: well developed, well nourished, no acute distress Eyes: conjunctiva and lids normal, PERRLA, EOMI ENT: TM clear, nares clear, oral exam WNL Neck: supple, no lymphadenopathy, no thyromegaly, no JVD Pulm: clear to auscultation and percussion, respiratory effort normal CV: regular rate and rhythm, S1-S2, no murmur, rub or gallop, no bruits Chest: no scars, masses, no lumps BREAST: breast exam declined GI: soft, non-tender; no hepatosplenomegaly, masses; active bowel sounds all quadrants GU: GU exam declined Lymph: no cervical, axillary or inguinal adenopathy MSK: gait normal, muscle tone and strength WNL, no joint swelling, effusions, discoloration, crepitus  SKIN: clear, good turgor, color WNL, no rashes, lesions, or ulcerations Neuro: normal mental status, normal strength, sensation, and motion Psych: alert; oriented to person, place and time, normally interactive and not anxious or depressed in appearance.  Assessment and Plan:  1. Routine general medical examination at a health care facility    2. Immunization due  Varicella-zoster vaccine subcutaneous   I have personally  reviewed the Medicare Annual Wellness questionnaire and have noted 1. The patient's medical and social history 2. Their use of alcohol, tobacco or illicit drugs 3. Their current medications and supplements 4. The patient's functional ability including ADL's, fall risks, home safety risks and hearing or visual  impairment. 5. Diet and physical activities 6. Evidence for depression or mood disorders  The patients weight, height, BMI and visual acuity have been recorded in the chart I have made referrals, counseling and provided education to the patient based review of the above and I have provided the pt with a written personalized care plan for preventive services.  I have provided the patient with a copy of your personalized plan for preventive services. Instructed to take the time to review along with their updated medication list.   Overall doing great.  Orders Today:  Orders Placed This Encounter  Procedures  . Varicella-zoster vaccine subcutaneous    Updated Medication List: (Includes new medications, updates to list, dose adjustments) No orders of the defined types were placed in this encounter.    Medications Discontinued: There are no discontinued medications.   Signed, Elpidio Galea. Maddon Horton, MD 10/15/2012 3:09 PM

## 2013-12-18 ENCOUNTER — Encounter: Payer: Self-pay | Admitting: Podiatry

## 2013-12-18 ENCOUNTER — Ambulatory Visit (INDEPENDENT_AMBULATORY_CARE_PROVIDER_SITE_OTHER): Payer: Medicare Other | Admitting: Podiatry

## 2013-12-18 VITALS — BP 138/99 | HR 111 | Resp 16

## 2013-12-18 DIAGNOSIS — M7752 Other enthesopathy of left foot: Secondary | ICD-10-CM

## 2013-12-18 DIAGNOSIS — M2040 Other hammer toe(s) (acquired), unspecified foot: Secondary | ICD-10-CM

## 2013-12-18 DIAGNOSIS — M7750 Other enthesopathy of unspecified foot: Secondary | ICD-10-CM

## 2013-12-18 NOTE — Progress Notes (Signed)
She presents today for a chief complaint of a painful fourth and fifth digit of the left foot. I have reviewed her past medical history medications and allergies. She denies fever chills nausea muscle aches and pains.  Objective: Pulses are stable she is alert and oriented x3 pulses are palpable left foot. 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 Reactive hyperkeratosis to the distal medial aspect of the fifth digit and the central lateral aspect of the fourth digit. She has pain on end range of motion of the PIPJ fourth digit left foot.  Assessment: Hammertoe deformities with capsulitis fourth and fifth toes of the left foot. Reactive hyperkeratosis fourth and fifth left.  Plan: At this point I performed an injection to the fourth digit lateral aspect PIPJ capsulitis.

## 2013-12-20 ENCOUNTER — Encounter: Payer: Self-pay | Admitting: Internal Medicine

## 2013-12-20 ENCOUNTER — Ambulatory Visit (INDEPENDENT_AMBULATORY_CARE_PROVIDER_SITE_OTHER): Payer: Medicare Other | Admitting: Internal Medicine

## 2013-12-20 VITALS — BP 188/92 | HR 78 | Temp 98.4°F | Wt 158.5 lb

## 2013-12-20 DIAGNOSIS — K5900 Constipation, unspecified: Secondary | ICD-10-CM

## 2013-12-20 NOTE — Progress Notes (Signed)
Subjective:    Patient ID: Paula Luna, female    DOB: 16-Mar-1925, 80 y.o.   MRN: [id number]  HPI  Pt presents to the clinic today with c/o pain in her stomach. She noticed it this am when she woke up. She has had associated chills and lightheadedness. She also feels gassy and bloated. She denies nausea, vomiting or diarrhea. Her last BM was.  Review of Systems      Past Medical History  Diagnosis Date  . Osteoporosis     Current Outpatient Prescriptions  Medication Sig Dispense Refill  . ibuprofen (ADVIL,MOTRIN) 200 MG tablet Take 200 mg by mouth as needed.       No current facility-administered medications for this visit.    Allergies  Allergen Reactions  . Latex Rash  . Alendronate Sodium     REACTION: passed out  . Tetanus Toxoid     REACTION: anaphylaxis    No family history on file.  History   Social History  . Marital Status: Married    Spouse Name: N/A    Number of Children: N/A  . Years of Education: N/A   Occupational History  . Not on file.   Social History Main Topics  . Smoking status: Former Games developer  . Smokeless tobacco: Not on file  . Alcohol Use: No  . Drug Use: No  . Sexual Activity: Not on file   Other Topics Concern  . Not on file   Social History Narrative   Widowed after 64 years   Working some at [employer] in [employer]           Constitutional: Denies fever, malaise, fatigue, headache or abrupt weight changes.  Respiratory: Denies difficulty breathing, shortness of breath, cough or sputum production.   Cardiovascular: Denies chest pain, chest tightness, palpitations or swelling in the hands or feet.  Gastrointestinal: Pt reports abdominal pain, bloating. Denies , constipation, diarrhea or blood in the stool.  GU: Denies urgency, frequency, pain with urination, burning sensation, blood in urine, odor or discharge.   No other specific complaints in a complete review of systems (except as listed in HPI  above).   Objective:   Physical Exam   BP 188/92  Pulse 78  Temp(Src) 98.4 F (36.9 C) (Oral)  Wt 158 lb 8 oz (71.895 kg)  SpO2 97% Wt Readings from Last 3 Encounters:  12/20/13 158 lb 8 oz (71.895 kg)  10/15/12 162 lb 12 oz (73.823 kg)  08/01/12 164 lb 8 oz (74.617 kg)    General: Appears her stated age, well developed, well nourished in NAD. Abdomen: Soft and nontender. Normal bowel sounds, no bruits noted. No distention or masses noted. Liver, spleen and kidneys non palpable.  BMET    Component Value Date/Time   NA 139 10/08/2012 1023   K 4.0 10/08/2012 1023   CL 105 10/08/2012 1023   CO2 27 10/08/2012 1023   GLUCOSE 96 10/08/2012 1023   BUN 15 10/08/2012 1023   CREATININE 0.8 10/08/2012 1023   CALCIUM 9.0 10/08/2012 1023   GFRNONAA 62.66 05/10/2010 0906    Lipid Panel     Component Value Date/Time   CHOL 190 10/08/2012 1023   TRIG 60.0 10/08/2012 1023   HDL 61.60 10/08/2012 1023   CHOLHDL 3 10/08/2012 1023   VLDL 12.0 10/08/2012 1023   LDLCALC 116* 10/08/2012 1023    CBC    Component Value Date/Time   WBC 6.7 10/08/2012 1023   RBC 4.90 10/08/2012  1023   HGB 12.8 10/08/2012 1023   HCT 38.5 10/08/2012 1023   PLT 213.0 10/08/2012 1023   MCV 78.6 10/08/2012 1023   MCHC 33.1 10/08/2012 1023   RDW 15.5* 10/08/2012 1023   LYMPHSABS 2.0 10/08/2012 1023   MONOABS 0.6 10/08/2012 1023   EOSABS 0.1 10/08/2012 1023   BASOSABS 0.0 10/08/2012 1023    Hgb A1C No results found for this basename: HGBA1C        Assessment & Plan:   Constipation:  Encouraged her to start taking Mirilax daily x 2-3 days Once she has a good BM, start taking it every other day Increase your fluids  RTC as needed

## 2013-12-20 NOTE — Patient Instructions (Addendum)

## 2013-12-20 NOTE — Progress Notes (Signed)
Pre visit review using our clinic review tool, if applicable. No additional management support is needed unless otherwise documented below in the visit note. 

## 2013-12-25 ENCOUNTER — Encounter: Payer: Self-pay | Admitting: Family Medicine

## 2013-12-25 ENCOUNTER — Ambulatory Visit (INDEPENDENT_AMBULATORY_CARE_PROVIDER_SITE_OTHER): Payer: Medicare Other | Admitting: Family Medicine

## 2013-12-25 VITALS — BP 170/80 | HR 78 | Temp 97.9°F | Ht <= 58 in | Wt 156.0 lb

## 2013-12-25 DIAGNOSIS — E669 Obesity, unspecified: Secondary | ICD-10-CM | POA: Insufficient documentation

## 2013-12-25 DIAGNOSIS — K5900 Constipation, unspecified: Secondary | ICD-10-CM

## 2013-12-25 DIAGNOSIS — H6120 Impacted cerumen, unspecified ear: Secondary | ICD-10-CM

## 2013-12-25 DIAGNOSIS — Z0000 Encounter for general adult medical examination without abnormal findings: Secondary | ICD-10-CM

## 2013-12-25 DIAGNOSIS — Z23 Encounter for immunization: Secondary | ICD-10-CM

## 2013-12-25 DIAGNOSIS — I10 Essential (primary) hypertension: Secondary | ICD-10-CM

## 2013-12-25 HISTORY — DX: Essential (primary) hypertension: I10

## 2013-12-25 MED ORDER — LISINOPRIL 20 MG PO TABS: 20.0000 mg | ORAL_TABLET | Freq: Every day | ORAL | Status: DC

## 2013-12-25 NOTE — Progress Notes (Signed)
Date:  12/25/2013   Name:  Paula Luna   DOB:  1925-07-02   MRN:  [id number] Gender: female Age: 80 y.o.  Primary Physician:  Hannah BeatSpencer Edee Nifong, MD   Chief Complaint: Annual Exam and Ear Fullness   Subjective:   History of Present Illness:  [name] is a 80 y.o. pleasant patient who presents with the following:  Health Maintenance Summary Reviewed and updated, unless pt declines services. And new onset HTN  Tobacco History Reviewed. Non-smoker Alcohol: No concerns, no excessive use Exercise Habits: Some activity, rec at least 30 mins 5 times a week Lumps or breast concerns: no Breast Cancer Family History: no  Health Maintenance  Topic Date Due  . Tetanus/tdap  10/07/1945  . Colonoscopy  10/07/1976  . Influenza Vaccine  04/12/2014  . Pneumococcal Polysaccharide Vaccine Age 78 And Over  Completed  . Zostavax  Completed     Immunization History  Administered Date(s) Administered  . Influenza Split 07/12/2012, 06/28/2013  . Influenza Whole 08/20/2007, 05/10/2010  . Pneumococcal Conjugate-13 12/25/2013  . Pneumococcal Polysaccharide-23 05/10/2010  . Zoster 10/15/2012    R ear.  Bp. The patient recently says him quite elevated blood pressures including one was greater than 200 systolic and greater than 100, diastolic. She has had some fluctuance, but she is check this multiple times, and all of them have been elevated. Eyes and now do not feel like herself.  Sometimes will get lightheaded.   Got lightheaded at work. The other day.   BP Readings from Last 3 Encounters:  12/25/13 170/80  12/20/13 188/92  12/18/13 138/99   Constipation. Sometimes will have a  Sometimes green BM, light brown.  Comes out the color of whatever it has.   198 syst. At [facility].  170/90.  Patient Active Problem List   Diagnosis Date Noted  . Obesity (BMI 30-39.9) 12/25/2013  . Hypertension 12/25/2013  . BACK PAIN 08/02/2010  . VASOVAGAL SYNCOPE 05/20/2009  . FATIGUE  05/20/2009    Past Medical History  Diagnosis Date  . Osteoporosis   . Hypertension 12/25/2013    Past Surgical History  Procedure Laterality Date  . Total abdominal hysterectomy      History   Social History  . Marital Status: Married    Spouse Name: N/A    Number of Children: N/A  . Years of Education: N/A   Occupational History  . Not on file.   Social History Main Topics  . Smoking status: Former Games developermoker  . Smokeless tobacco: Not on file  . Alcohol Use: No  . Drug Use: No  . Sexual Activity: Not on file   Other Topics Concern  . Not on file   Social History Narrative   Widowed after 64 years   Working some at [employer]   Active in [employer]          No family history on file.  Allergies  Allergen Reactions  . Latex Rash  . Alendronate Sodium     REACTION: passed out  . Tetanus Toxoid     REACTION: anaphylaxis    Medication list has been reviewed and updated.  Review of Systems:   General: Denies fever, chills, sweats. No significant weight loss. As above Eyes: Denies blurring,significant itching ENT: Denies earache, sore throat, and hoarseness.  Cardiovascular: Denies chest pains, palpitations, dyspnea on exertion,  Respiratory: Denies cough, dyspnea at rest,wheeezing Breast: no concerns about lumps GI:as above, no diarrhea or bleeding. GU: Denies dysuria, hematuria, urinary hesitancy, nocturia, denies  STD risk, no concerns about discharge Musculoskeletal: Denies back pain, joint pain Derm: Denies rash, itching Neuro: Denies  paresthesias, frequent falls, frequent headaches Psych: Denies depression, anxiety Endocrine: Denies cold intolerance, heat intolerance, polydipsia Heme: Denies enlarged lymph nodes Allergy: No hayfever  Objective:   Physical Examination: BP 170/80  Pulse 78  Temp(Src) 97.9 F (36.6 C) (Oral)  Ht 4\' 5"  (1.346 m)  Wt 156 lb (70.761 kg)  BMI 39.06 kg/m2  SpO2 97%  Ideal Body Weight: Weight in (lb) to have  BMI = 25: 99.7  GEN: well developed, well nourished, no acute distress Eyes: conjunctiva and lids normal, PERRLA, EOMI ENT: TM clear, nares clear, oral exam WNL Neck: supple, no lymphadenopathy, no thyromegaly, no JVD Pulm: clear to auscultation and percussion, respiratory effort normal CV: regular rate and rhythm, S1-S2, no murmur, rub or gallop, no bruits Chest: no scars, masses, no lumps BREAST: breast exam declined GI: soft, non-tender; no hepatosplenomegaly, masses; active bowel sounds all quadrants GU: GU exam declined Lymph: no cervical, axillary or inguinal adenopathy MSK: gait normal, muscle tone and strength WNL, no joint swelling, effusions, discoloration, crepitus  SKIN: clear, good turgor, color WNL, no rashes, lesions, or ulcerations Neuro: normal mental status, normal strength, sensation, and motion Psych: alert; oriented to person, place and time, normally interactive and not anxious or depressed in appearance.   All labs reviewed with patient. Lipids:    Component Value Date/Time   CHOL 190 10/08/2012 1023   TRIG 60.0 10/08/2012 1023   HDL 61.60 10/08/2012 1023   LDLDIRECT 101.2 05/20/2009 0000   VLDL 12.0 10/08/2012 1023   CHOLHDL 3 10/08/2012 1023    CBC:    Component Value Date/Time   WBC 6.7 10/08/2012 1023   HGB 12.8 10/08/2012 1023   HCT 38.5 10/08/2012 1023   PLT 213.0 10/08/2012 1023   MCV 78.6 10/08/2012 1023   NEUTROABS 3.9 10/08/2012 1023   LYMPHSABS 2.0 10/08/2012 1023   MONOABS 0.6 10/08/2012 1023   EOSABS 0.1 10/08/2012 1023   BASOSABS 0.0 10/08/2012 1023    Basic Metabolic Panel:    Component Value Date/Time   NA 139 10/08/2012 1023   K 4.0 10/08/2012 1023   CL 105 10/08/2012 1023   CO2 27 10/08/2012 1023   BUN 15 10/08/2012 1023   CREATININE 0.8 10/08/2012 1023   GLUCOSE 96 10/08/2012 1023   CALCIUM 9.0 10/08/2012 1023    Lab Results  Component Value Date   ALT 12 10/08/2012   AST 18 10/08/2012   ALKPHOS 73 10/08/2012   BILITOT 0.5 10/08/2012     Lab Results  Component Value Date   TSH 1.28 10/08/2012     No results found.  Assessment & Plan:   Health Maintenance Exam: The patient's preventative maintenance and recommended screening tests for an annual wellness exam were reviewed in full today. Brought up to date unless services declined.  Counselled on the importance of diet, exercise, and its role in overall health and mortality. The patient's FH and SH was reviewed, including their home life, tobacco status, and drug and alcohol status.  I have personally reviewed the Medicare Annual Wellness questionnaire and have noted 1. The patient's medical and social history 2. Their use of alcohol, tobacco or illicit drugs 3. Their current medications and supplements 4. The patient's functional ability including ADL's, fall risks, home safety risks and hearing or visual             impairment. 5. Diet and physical activities  6. Evidence for depression or mood disorders  The patients weight, height, BMI and visual acuity have been recorded in the chart I have made referrals, counseling and provided education to the patient based review of the above and I have provided the pt with a written personalized care plan for preventive services.  I have provided the patient with a copy of your personalized plan for preventive services. Instructed to take the time to review along with their updated medication list.   Routine general medical examination at a health care facility  Need for prophylactic vaccination against Streptococcus pneumoniae (pneumococcus) - Plan: Pneumococcal conjugate vaccine 13-valent  Hypertension  Cerumen impaction  Unspecified constipation  Onset hypertension, start ACE inhibitor and follow up in one month.  Reviewed treatment of constipation  Ceruminosis is noted.  Wax is removed by syringing and manual debridement. Instructions for home care to prevent wax buildup are given.   Follow-up: Return in  about 4 weeks (around 01/22/2014).  New Prescriptions   LISINOPRIL (PRINIVIL,ZESTRIL) 20 MG TABLET    Take 1 tablet (20 mg total) by mouth daily.   Orders Placed This Encounter  Procedures  . Pneumococcal conjugate vaccine 13-valent   Patient Instructions  GETTING TO GOOD BOWEL HEALTH. Irregular bowel habits such as constipation and diarrhea can lead to many problems over time.  Having one soft bowel movement a day is the most important way to prevent further problems.  The anorectal canal is designed to handle stretching and feces to safely manage our ability to get rid of solid waste (feces, poop, stool) out of our body.  BUT, hard constipated stools can act like ripping concrete bricks and diarrhea can be a burning fire to this very sensitive area of our body, causing inflamed hemorrhoids, anal fissures, increasing risk is perirectal abscesses, abdominal pain/bloating, an making irritable bowel worse.     The goal: ONE SOFT BOWEL MOVEMENT A DAY!  To have soft, regular bowel movements:    Drink at least 8 tall glasses of water a day.     Take plenty of fiber.  Fiber is the undigested part of plant food that passes into the colon, acting s "natures broom" to encourage bowel motility and movement.  Fiber can absorb and hold large amounts of water. This results in a larger, bulkier stool, which is soft and easier to pass. Work gradually over several weeks up to 6 servings a day of fiber (25g a day even more if needed) in the form of: o Vegetables -- Root (potatoes, carrots, turnips), leafy green (lettuce, salad greens, celery, spinach), or cooked high residue (cabbage, broccoli, etc) o Fruit -- Fresh (unpeeled skin & pulp), Dried (prunes, apricots, cherries, etc ),  or stewed ( applesauce)  o Whole grain breads, pasta, etc (whole wheat)  o Bran cereals    Bulking Agents -- This type of water-retaining fiber generally is easily obtained each day by one of the following:  o Psyllium bran -- The  psyllium plant is remarkable because its ground seeds can retain so much water. This product is available as Metamucil, Konsyl, Effersyllium, Per Diem Fiber, or the less expensive generic preparation in drug and health food stores. Although labeled a laxative, it really is not a laxative.  o Methylcellulose -- This is another fiber derived from wood which also retains water. It is available as Citrucel. o Polyethylene Glycol - and "artificial" fiber commonly called Miralax or Glycolax.  It is helpful for people with gassy or bloated feelings  with regular fiber o Flax Seed - a less gassy fiber than psyllium    No reading or other relaxing activity while on the toilet. If bowel movements take longer than 5 minutes, you are too constipated    AVOID CONSTIPATION.  High fiber and water intake usually takes care of this.  Sometimes a laxative is needed to stimulate more frequent bowel movements, but    Laxatives are not a good long-term solution as it can wear the colon out. o Osmotics (Milk of Magnesia, Fleets phosphosoda, Magnesium citrate, MiraLax, GoLytely) are safer than  o Stimulants (Senokot, Castor Oil, Dulcolax, Ex Lax)    o Do not take laxatives for more than 7days in a row.   Signed,  Elpidio GaleaSpencer T. Henchy Mccauley, MD, CAQ Sports Medicine  [facility] [address] [place] [address] Phone: [phone] Fax: [phone]  Patient's Medications  New Prescriptions   LISINOPRIL (PRINIVIL,ZESTRIL) 20 MG TABLET    Take 1 tablet (20 mg total) by mouth daily.  Previous Medications   IBUPROFEN (ADVIL,MOTRIN) 200 MG TABLET    Take 200 mg by mouth as needed.  Modified Medications   No medications on file  Discontinued Medications   No medications on file

## 2013-12-25 NOTE — Patient Instructions (Signed)
GETTING TO GOOD BOWEL HEALTH. Irregular bowel habits such as constipation and diarrhea can lead to many problems over time.  Having one soft bowel movement a day is the most important way to prevent further problems.  The anorectal canal is designed to handle stretching and feces to safely manage our ability to get rid of solid waste (feces, poop, stool) out of our body.  BUT, hard constipated stools can act like ripping concrete bricks and diarrhea can be a burning fire to this very sensitive area of our body, causing inflamed hemorrhoids, anal fissures, increasing risk is perirectal abscesses, abdominal pain/bloating, an making irritable bowel worse.     The goal: ONE SOFT BOWEL MOVEMENT A DAY!  To have soft, regular bowel movements:    Drink at least 8 tall glasses of water a day.     Take plenty of fiber.  Fiber is the undigested part of plant food that passes into the colon, acting s "natures broom" to encourage bowel motility and movement.  Fiber can absorb and hold large amounts of water. This results in a larger, bulkier stool, which is soft and easier to pass. Work gradually over several weeks up to 6 servings a day of fiber (25g a day even more if needed) in the form of: o Vegetables -- Root (potatoes, carrots, turnips), leafy green (lettuce, salad greens, celery, spinach), or cooked high residue (cabbage, broccoli, etc) o Fruit -- Fresh (unpeeled skin & pulp), Dried (prunes, apricots, cherries, etc ),  or stewed ( applesauce)  o Whole grain breads, pasta, etc (whole wheat)  o Bran cereals    Bulking Agents -- This type of water-retaining fiber generally is easily obtained each day by one of the following:  o Psyllium bran -- The psyllium plant is remarkable because its ground seeds can retain so much water. This product is available as Metamucil, Konsyl, Effersyllium, Per Diem Fiber, or the less expensive generic preparation in drug and health food stores. Although labeled a laxative, it really  is not a laxative.  o Methylcellulose -- This is another fiber derived from wood which also retains water. It is available as Citrucel. o Polyethylene Glycol - and "artificial" fiber commonly called Miralax or Glycolax.  It is helpful for people with gassy or bloated feelings with regular fiber o Flax Seed - a less gassy fiber than psyllium    No reading or other relaxing activity while on the toilet. If bowel movements take longer than 5 minutes, you are too constipated    AVOID CONSTIPATION.  High fiber and water intake usually takes care of this.  Sometimes a laxative is needed to stimulate more frequent bowel movements, but    Laxatives are not a good long-term solution as it can wear the colon out. o Osmotics (Milk of Magnesia, Fleets phosphosoda, Magnesium citrate, MiraLax, GoLytely) are safer than  o Stimulants (Senokot, Castor Oil, Dulcolax, Ex Lax)    o Do not take laxatives for more than 7days in a row.

## 2013-12-25 NOTE — Progress Notes (Signed)
Pre visit review using our clinic review tool, if applicable. No additional management support is needed unless otherwise documented below in the visit note. 

## 2013-12-27 ENCOUNTER — Telehealth: Payer: Self-pay | Admitting: Family Medicine

## 2013-12-27 NOTE — Telephone Encounter (Signed)
Pt called and says she feels better and wants to return to work Monday at 7:00 a.m., but she [facility] w/no restrictions.  I explained that you are not in the office and probably will not see this message until after 7:00 a.m. Monday.  Can you please fax a note releasing her w/no restriction upon your arrival Monday? The fax # to [employer] is [phone], attn Vernona RiegerLaura or Leigh in HR. Thank you.

## 2013-12-28 NOTE — Telephone Encounter (Signed)
i think that this is reasonable. Can you send note as requested. Able to return to work without restrictions as of Monday, 12/30/2013.

## 2013-12-30 ENCOUNTER — Encounter: Payer: Self-pay | Admitting: *Deleted

## 2013-12-30 NOTE — Telephone Encounter (Signed)
[facility]. Baxter KailA Harmon, CMA Student

## 2014-01-17 ENCOUNTER — Telehealth: Payer: Self-pay | Admitting: Family Medicine

## 2014-01-17 NOTE — Telephone Encounter (Signed)
Faxed ppw 01/17/14. LVM for pt stating that I had faxed papers and a copy was ready for pickup.

## 2014-01-17 NOTE — Telephone Encounter (Signed)
FMLA paperwork to be completed in your inbox. Please return to [place] when finished. Pt states paperwork needs to be submitted before 01/20/14.

## 2014-01-29 ENCOUNTER — Ambulatory Visit (INDEPENDENT_AMBULATORY_CARE_PROVIDER_SITE_OTHER): Payer: Medicare Other | Admitting: Family Medicine

## 2014-01-29 ENCOUNTER — Encounter: Payer: Self-pay | Admitting: Family Medicine

## 2014-01-29 VITALS — BP 168/97 | HR 83 | Temp 97.8°F | Ht 59.61 in | Wt 156.5 lb

## 2014-01-29 DIAGNOSIS — I10 Essential (primary) hypertension: Secondary | ICD-10-CM

## 2014-01-29 DIAGNOSIS — R5383 Other fatigue: Secondary | ICD-10-CM

## 2014-01-29 DIAGNOSIS — R5381 Other malaise: Secondary | ICD-10-CM

## 2014-01-29 DIAGNOSIS — Z131 Encounter for screening for diabetes mellitus: Secondary | ICD-10-CM

## 2014-01-29 DIAGNOSIS — Z13220 Encounter for screening for lipoid disorders: Secondary | ICD-10-CM

## 2014-01-29 DIAGNOSIS — L989 Disorder of the skin and subcutaneous tissue, unspecified: Secondary | ICD-10-CM

## 2014-01-29 NOTE — Progress Notes (Signed)
[address] [place] [address] Phone: [phone] Fax: [phone]  Patient ID: Paula AverRoxanne Luna MRN: [id number], DOB: 12-08-1924, 80 y.o. Date of Encounter: 01/29/2014  Primary Physician:  Hannah BeatSpencer Gershon Shorten, MD   Chief Complaint: Follow-up   Subjective:   History of Present Illness:  Paula ReasonerRoxanne Sofie HartiganMarino is a 80 y.o. very pleasant female patient who presents with the following:  HTN: Tolerating all medications without side effects Stable and at goal No CP, no sob. No HA.  120-140's syst at home  Basic Metabolic Panel:    Component Value Date/Time   NA 141 01/29/2014 1612   K 4.7 01/29/2014 1612   CL 106 01/29/2014 1612   CO2 29 01/29/2014 1612   BUN 13 01/29/2014 1612   CREATININE 0.9 01/29/2014 1612   GLUCOSE 92 01/29/2014 1612   CALCIUM 9.5 01/29/2014 1612    140/60 BP HTN Nonfasting  L toe, 5th. cryo Forefoot breakdown She also has a great deal of forefoot breakdown and has developed a painful lesion / hyperkeratotic on the 5th toe where her 5th toe is going underneath the 4th toe. Dr. Al CorpusHyatt froze this off with good success previously and paired down. Now pain with walking.    Past Medical History, Surgical History, Social History, Family History, Problem List, Medications, and Allergies have been reviewed and updated if relevant.  Review of Systems:  GEN: No acute illnesses, no fevers, chills. GI: No n/v/d, eating normally Pulm: No SOB Interactive and getting along well at home.  Otherwise, ROS is as per the HPI.  Objective:   Physical Examination: BP 168/97  Pulse 83  Temp(Src) 97.8 F (36.6 C) (Oral)  Ht 4' 11.61" (1.514 m)  Wt 156 lb 8 oz (70.988 kg)  BMI 30.97 kg/m2   GEN: WDWN, NAD, Non-toxic, A & O x 3 HEENT: Atraumatic, Normocephalic. Neck supple. No masses, No LAD. Ears and Nose: No external deformity. CV: RRR, No M/G/R. No JVD. No thrill. No extra heart sounds. PULM: CTA B, no wheezes, crackles, rhonchi. No retractions. No resp. distress.  No accessory muscle use. EXTR: No c/c/e NEURO Normal gait.  PSYCH: Normally interactive. Conversant. Not depressed or anxious appearing.  Calm demeanor.  SKIN: skin lesion / hyperkeratosis on the 5th L toe and painful to palpation  Laboratory and Imaging Data: Results for orders placed in visit on 01/29/14  BASIC METABOLIC PANEL      Result Value Ref Range   Sodium 141  135 - 145 mEq/L   Potassium 4.7  3.5 - 5.1 mEq/L   Chloride 106  96 - 112 mEq/L   CO2 29  19 - 32 mEq/L   Glucose, Bld 92  70 - 99 mg/dL   BUN 13  6 - 23 mg/dL   Creatinine, Ser 0.9  0.4 - 1.2 mg/dL   Calcium 9.5  8.4 - 86.510.5 mg/dL   GFR 78.4662.90  >96.29>60.00 mL/min  CBC WITH DIFFERENTIAL      Result Value Ref Range   WBC 7.0  4.0 - 10.5 K/uL   RBC 4.86  3.87 - 5.11 Mil/uL   Hemoglobin 12.9  12.0 - 15.0 g/dL   HCT 52.839.4  41.336.0 - 24.446.0 %   MCV 81.1  78.0 - 100.0 fl   MCHC 32.8  30.0 - 36.0 g/dL   RDW 01.015.9 (*) 27.211.5 - 53.615.5 %   Platelets 216.0  150.0 - 400.0 K/uL   Neutrophils Relative % 52.7  43.0 - 77.0 %   Lymphocytes Relative 35.1  12.0 - 46.0 %   Monocytes Relative 10.2  3.0 - 12.0 %   Eosinophils Relative 1.9  0.0 - 5.0 %   Basophils Relative 0.1  0.0 - 3.0 %   Neutro Abs 3.7  1.4 - 7.7 K/uL   Lymphs Abs 2.5  0.7 - 4.0 K/uL   Monocytes Absolute 0.7  0.1 - 1.0 K/uL   Eosinophils Absolute 0.1  0.0 - 0.7 K/uL   Basophils Absolute 0.0  0.0 - 0.1 K/uL  HEPATIC FUNCTION PANEL      Result Value Ref Range   Total Bilirubin 0.4  0.2 - 1.2 mg/dL   Bilirubin, Direct 0.1  0.0 - 0.3 mg/dL   Alkaline Phosphatase 61  39 - 117 U/L   AST 21  0 - 37 U/L   ALT 14  0 - 35 U/L   Total Protein 7.0  6.0 - 8.3 g/dL   Albumin 3.8  3.5 - 5.2 g/dL  LIPID PANEL      Result Value Ref Range   Cholesterol 188  0 - 200 mg/dL   Triglycerides 57.896.0  0.0 - 149.0 mg/dL   HDL 46.9666.80  >29.52>39.00 mg/dL   VLDL 84.119.2  0.0 - 32.440.0 mg/dL   LDL Cholesterol 401102 (*) 0 - 99 mg/dL   Total CHOL/HDL Ratio 3       Assessment & Plan:    Hypertension  Screening for lipoid disorders - Plan: Lipid panel  Screening for diabetes mellitus - Plan: Basic metabolic panel  Other malaise and fatigue - Plan: Basic metabolic panel, CBC with Differential, Hepatic function panel  Skin lesion  HTN doing much better. Daily log brought in.   Cryotherapy  Reason: painful skin lesion Location: L 5th toe  Liquid nitrogen was applied using the liquid nitrogen gun without difficulty with an otoscope tip for concentration. Tolerated well without complications.   Follow-up: next year for CPX Unless noted above, the patient is to follow-up if symptoms worsen. Red flags were reviewed with the patient.  New Prescriptions   No medications on file   Orders Placed This Encounter  Procedures  . Basic metabolic panel  . CBC with Differential  . Hepatic function panel  . Lipid panel    Signed,  Karleen HampshireSpencer T. Jaylin Benzel, MD, CAQ Sports Medicine   Patient's Medications  New Prescriptions   No medications on file  Previous Medications   IBUPROFEN (ADVIL,MOTRIN) 200 MG TABLET    Take 200 mg by mouth as needed.   LISINOPRIL (PRINIVIL,ZESTRIL) 20 MG TABLET    Take 1 tablet (20 mg total) by mouth daily.  Modified Medications   No medications on file  Discontinued Medications   No medications on file

## 2014-01-29 NOTE — Progress Notes (Signed)
Pre visit review using our clinic review tool, if applicable. No additional management support is needed unless otherwise documented below in the visit note. 

## 2014-01-30 LAB — HEPATIC FUNCTION PANEL
ALT: 14 U/L (ref 0–35)
AST: 21 U/L (ref 0–37)
Albumin: 3.8 g/dL (ref 3.5–5.2)
Alkaline Phosphatase: 61 U/L (ref 39–117)
Bilirubin, Direct: 0.1 mg/dL (ref 0.0–0.3)
Total Bilirubin: 0.4 mg/dL (ref 0.2–1.2)
Total Protein: 7 g/dL (ref 6.0–8.3)

## 2014-01-30 LAB — CBC WITH DIFFERENTIAL/PLATELET
BASOS ABS: 0 10*3/uL (ref 0.0–0.1)
Basophils Relative: 0.1 % (ref 0.0–3.0)
EOS ABS: 0.1 10*3/uL (ref 0.0–0.7)
Eosinophils Relative: 1.9 % (ref 0.0–5.0)
HCT: 39.4 % (ref 36.0–46.0)
Hemoglobin: 12.9 g/dL (ref 12.0–15.0)
LYMPHS ABS: 2.5 10*3/uL (ref 0.7–4.0)
Lymphocytes Relative: 35.1 % (ref 12.0–46.0)
MCHC: 32.8 g/dL (ref 30.0–36.0)
MCV: 81.1 fl (ref 78.0–100.0)
MONOS PCT: 10.2 % (ref 3.0–12.0)
Monocytes Absolute: 0.7 10*3/uL (ref 0.1–1.0)
Neutro Abs: 3.7 10*3/uL (ref 1.4–7.7)
Neutrophils Relative %: 52.7 % (ref 43.0–77.0)
Platelets: 216 10*3/uL (ref 150.0–400.0)
RBC: 4.86 Mil/uL (ref 3.87–5.11)
RDW: 15.9 % — AB (ref 11.5–15.5)
WBC: 7 10*3/uL (ref 4.0–10.5)

## 2014-01-30 LAB — BASIC METABOLIC PANEL
BUN: 13 mg/dL (ref 6–23)
CALCIUM: 9.5 mg/dL (ref 8.4–10.5)
CO2: 29 meq/L (ref 19–32)
Chloride: 106 mEq/L (ref 96–112)
Creatinine, Ser: 0.9 mg/dL (ref 0.4–1.2)
GFR: 62.9 mL/min (ref >60.00)
GLUCOSE: 92 mg/dL (ref 70–99)
Potassium: 4.7 mEq/L (ref 3.5–5.1)
Sodium: 141 mEq/L (ref 135–145)

## 2014-01-30 LAB — LIPID PANEL
CHOLESTEROL: 188 mg/dL (ref 0–200)
HDL: 66.8 mg/dL (ref >39.00)
LDL CALC: 102 mg/dL — AB (ref 0–99)
Total CHOL/HDL Ratio: 3
Triglycerides: 96 mg/dL (ref 0.0–149.0)
VLDL: 19.2 mg/dL (ref 0.0–40.0)

## 2014-01-31 ENCOUNTER — Encounter: Payer: Self-pay | Admitting: *Deleted

## 2014-03-05 ENCOUNTER — Other Ambulatory Visit: Payer: Self-pay | Admitting: *Deleted

## 2014-03-05 MED ORDER — LISINOPRIL 10 MG PO TABS: 10.0000 mg | ORAL_TABLET | Freq: Every day | ORAL | Status: DC

## 2014-03-05 NOTE — Telephone Encounter (Signed)
Received fax from [facility] and CAN:  Patient thinks Lisinopril 20 mg is too strong.  It is causing fatigue and weakness.  Requesting to decrease Lisinopril to 10 mg daily.  Per Dr. Patsy Lageropland, okay to change to Lisinopril 10 mg daily.  Rx sent to [facility] [address]  [name] notified.

## 2014-05-12 ENCOUNTER — Ambulatory Visit: Payer: Medicare Other | Admitting: Podiatry

## 2014-05-15 ENCOUNTER — Ambulatory Visit (INDEPENDENT_AMBULATORY_CARE_PROVIDER_SITE_OTHER): Payer: Medicare Other | Admitting: Podiatry

## 2014-05-15 VITALS — BP 181/89 | HR 86 | Resp 16

## 2014-05-15 DIAGNOSIS — M7752 Other enthesopathy of left foot: Secondary | ICD-10-CM

## 2014-05-15 DIAGNOSIS — M2040 Other hammer toe(s) (acquired), unspecified foot: Secondary | ICD-10-CM

## 2014-05-15 DIAGNOSIS — M7750 Other enthesopathy of unspecified foot: Secondary | ICD-10-CM

## 2014-05-15 NOTE — Progress Notes (Signed)
She presents today with a chief complaint of a painful fourth PIPJ of the left foot. She also has a painful corn to the fifth digit of the left foot. She denies fever chills nausea vomiting muscle aches and pains.  Objective: Vital signs are stable she is alert and oriented x3 pulses are palpable bilateral. She has pain on palpation to the PIPJ fourth digit left foot.  Assessment: Capsulitis fourth toe left foot. Corn fifth toe left foot.  Plan: Injected the or PIPJ today with dexamethasone and local anesthetic debrided all reactive hyperkeratosis followup with her when necessary.

## 2014-09-08 ENCOUNTER — Telehealth: Payer: Self-pay | Admitting: *Deleted

## 2014-09-08 NOTE — Telephone Encounter (Signed)
agree

## 2014-09-08 NOTE — Telephone Encounter (Signed)
FYI, Pt called wanting to know if she can take mucinex for cough. She has a cough, spits out white sputum in the am only, then has cough the rest of the day. She spoke to her pharmacist about her cough and was advised to take mucinex. Pt denies fever, body aches, chills. I advised pt it was ok to take mucinex, to drink plenty of fluids, and to call if starts running a fever, or symptoms get worse.

## 2014-09-09 ENCOUNTER — Other Ambulatory Visit: Payer: Self-pay | Admitting: Family Medicine

## 2014-09-09 NOTE — Telephone Encounter (Signed)
Tasha's further call gets more information and i am comfortable with this disposition. Reliable patient.  Electronically Signed  By: Hannah BeatSpencer Sebrena Engh, MD On: 09/09/2014 10:35 AM

## 2014-09-09 NOTE — Telephone Encounter (Signed)
PLEASE NOTE: All timestamps contained within this report are represented as [place] Standard Time. CONFIDENTIALTY NOTICE: This fax transmission is intended only for the addressee. It contains information that is legally privileged, confidential or otherwise protected from use or disclosure. If you are not the intended recipient, you are strictly prohibited from reviewing, disclosing, copying using or disseminating any of this information or taking any action in reliance on or regarding this information. If you have received this fax in error, please notify us immediately by telephone so that we can arrange for its return to us. Phone: [phone], Toll-Free: [phone], Fax: [phone] Page: 1 of 2 Call Id: [id number] [facility] Client TELEPHONE ADVICE RECORD [facility] Patient Name: Paula Luna Calamari Gender: Female DOB: 07-14-1925 Age: [age] Y 11 M 2 D Return Phone Number: [phone] (Primary) Address: City/State/Zip: [place] Client [facility] Client [facility] Day Physician Copland, Spencer Contact Type Call Call Type Triage / Clinical Relationship To Patient Self Return Phone Number [phone] (Primary) Chief Complaint WHEEZING Initial Comment Caller states she has wheezy cough PreDisposition Did not know what to do Nurse Assessment Nurse: Vickey SagesAtkins, RN, Jacquilin Date/Time (Eastern Time): 09/08/2014 1:05:23 PM Confirm and document reason for call. If symptomatic, describe symptoms. ---Caller states she has a cough with some wheezing (patient states that is just sometimes when she is laying down). Coughing up a white sputum - No fever. Caller states she wants to know if she can take Mucinex Has the patient traveled out of the country within the last 30 days? ---No Does the patient require triage? ---Yes Related visit to physician within the last 2 weeks? ---No Does the  PT have any chronic conditions? (i.e. diabetes, asthma, etc.) ---Yes List chronic conditions. ---htn Guidelines Guideline Title Affirmed Question Affirmed Notes Nurse Date/Time (Eastern Time) Cough - Acute Productive Cough with cold symptoms (e.g., runny nose, postnasal drip, throat clearing) (all triage questions negative) Atkins, RN, Jacquilin 09/08/2014 1:07:12 PM Disp. Time Lamount Cohen(Eastern Time) Disposition Final User 09/08/2014 1:03:56 PM Send to Urgent Queue Gasper Sellsartin, David 09/08/2014 1:11:36 PM See PCP When Office is Open (within 3 days) Yes Atkins, RN, Jacquilin Disposition Overriden: Home Care Override Reason: Patient's symptoms need a higher level of care Caller Understands: Yes PLEASE NOTE: All timestamps contained within this report are represented as [place] Standard Time. CONFIDENTIALTY NOTICE: This fax transmission is intended only for the addressee. It contains information that is legally privileged, confidential or otherwise protected from use or disclosure. If you are not the intended recipient, you are strictly prohibited from reviewing, disclosing, copying using or disseminating any of this information or taking any action in reliance on or regarding this information. If you have received this fax in error, please notify us immediately by telephone so that we can arrange for its return to us. Phone: [phone], Toll-Free: [phone], Fax: [phone] Page: 2 of 2 Call Id: [id number] Disagree/Comply: Comply Care Advice Given Per Guideline SEE PCP WITHIN 3 DAYS: You need to be examined within 2 or 3 days. Call your doctor during regular office hours and make an appointment. (Note: if office will be open tomorrow, tell caller to call then, not in 3 days). CALL BACK IF: * Fever over 103 F (39.4 C) * Difficulty breathing occurs * You become worse. After Care Instructions Given Call Event Type User Date / Time Description Comments User: Loistine ChanceJacquilin, Atkins, RN Date/Time  Lamount Cohen(Eastern Time): 09/08/2014 1:12:21 PM [email or web address]  Advised caller she can try Mucinex but if not improving in 2 days to call back for appointment.

## 2014-09-09 NOTE — Telephone Encounter (Signed)
The team health message mentions wheezing which is not mentioned in phone note from 09/08/14.

## 2014-09-24 ENCOUNTER — Telehealth: Payer: Self-pay | Admitting: *Deleted

## 2014-09-24 NOTE — Telephone Encounter (Addendum)
Pt states she has some questions, please call.

## 2014-09-24 NOTE — Telephone Encounter (Signed)
Called and spoke with pt regarding message she left. Pt stated she wanted an appt and has made one already for 1.25.16 with dr Al Corpushyatt.

## 2014-10-06 ENCOUNTER — Ambulatory Visit: Payer: Self-pay | Admitting: Podiatry

## 2014-10-17 ENCOUNTER — Other Ambulatory Visit: Payer: Self-pay | Admitting: Family Medicine

## 2014-12-26 ENCOUNTER — Telehealth: Payer: Self-pay | Admitting: Family Medicine

## 2014-12-26 ENCOUNTER — Other Ambulatory Visit (INDEPENDENT_AMBULATORY_CARE_PROVIDER_SITE_OTHER): Payer: PPO

## 2014-12-26 DIAGNOSIS — R5383 Other fatigue: Secondary | ICD-10-CM | POA: Diagnosis not present

## 2014-12-26 DIAGNOSIS — I10 Essential (primary) hypertension: Secondary | ICD-10-CM

## 2014-12-26 LAB — CBC WITH DIFFERENTIAL/PLATELET
BASOS ABS: 0 10*3/uL (ref 0.0–0.1)
Basophils Relative: 0.5 % (ref 0.0–3.0)
EOS ABS: 0.2 10*3/uL (ref 0.0–0.7)
Eosinophils Relative: 2.6 % (ref 0.0–5.0)
HEMATOCRIT: 37.8 % (ref 36.0–46.0)
Hemoglobin: 12.5 g/dL (ref 12.0–15.0)
LYMPHS ABS: 2.1 10*3/uL (ref 0.7–4.0)
Lymphocytes Relative: 36.3 % (ref 12.0–46.0)
MCHC: 33.2 g/dL (ref 30.0–36.0)
MCV: 77.9 fl — AB (ref 78.0–100.0)
Monocytes Absolute: 0.6 10*3/uL (ref 0.1–1.0)
Monocytes Relative: 9.6 % (ref 3.0–12.0)
NEUTROS PCT: 51 % (ref 43.0–77.0)
Neutro Abs: 3 10*3/uL (ref 1.4–7.7)
Platelets: 207 10*3/uL (ref 150.0–400.0)
RBC: 4.85 Mil/uL (ref 3.87–5.11)
RDW: 16.5 % — AB (ref 11.5–15.5)
WBC: 5.9 10*3/uL (ref 4.0–10.5)

## 2014-12-26 LAB — LIPID PANEL
Cholesterol: 181 mg/dL (ref 0–200)
HDL: 68.5 mg/dL (ref >39.00)
LDL Cholesterol: 101 mg/dL — ABNORMAL HIGH (ref 0–99)
NONHDL: 112.5
Total CHOL/HDL Ratio: 3
Triglycerides: 58 mg/dL (ref 0.0–149.0)
VLDL: 11.6 mg/dL (ref 0.0–40.0)

## 2014-12-26 LAB — COMPREHENSIVE METABOLIC PANEL
ALBUMIN: 3.7 g/dL (ref 3.5–5.2)
ALT: 10 U/L (ref 0–35)
AST: 14 U/L (ref 0–37)
Alkaline Phosphatase: 74 U/L (ref 39–117)
BUN: 14 mg/dL (ref 6–23)
CHLORIDE: 108 meq/L (ref 96–112)
CO2: 30 mEq/L (ref 19–32)
Calcium: 9.3 mg/dL (ref 8.4–10.5)
Creatinine, Ser: 0.84 mg/dL (ref 0.40–1.20)
GFR: 67.98 mL/min (ref >60.00)
Glucose, Bld: 94 mg/dL (ref 70–99)
POTASSIUM: 4.8 meq/L (ref 3.5–5.1)
SODIUM: 141 meq/L (ref 135–145)
TOTAL PROTEIN: 6.3 g/dL (ref 6.0–8.3)
Total Bilirubin: 0.6 mg/dL (ref 0.2–1.2)

## 2014-12-26 LAB — TSH: TSH: 1.14 u[IU]/mL (ref 0.35–4.50)

## 2014-12-26 NOTE — Telephone Encounter (Signed)
-----   Message from Baldomero LamyNatasha C Chavers sent at 12/26/2014  1:28 PM EDT ----- Regarding: Need lab orders on Dr. Cyndie Chimeopland's pt ASAP This pt of Dr. Patsy Lageropland walked in for cpx labs today, he appt with him is in 2 weeks. Could you possibly order her labs?  Thanks Rodney Boozeasha

## 2015-01-01 ENCOUNTER — Other Ambulatory Visit: Payer: Self-pay | Admitting: Family Medicine

## 2015-01-05 ENCOUNTER — Ambulatory Visit (INDEPENDENT_AMBULATORY_CARE_PROVIDER_SITE_OTHER): Payer: PPO | Admitting: Family Medicine

## 2015-01-05 ENCOUNTER — Telehealth: Payer: Self-pay | Admitting: Family Medicine

## 2015-01-05 ENCOUNTER — Encounter: Payer: Self-pay | Admitting: Family Medicine

## 2015-01-05 VITALS — BP 130/70 | HR 76 | Temp 97.6°F | Ht 59.5 in | Wt 153.2 lb

## 2015-01-05 DIAGNOSIS — Z0000 Encounter for general adult medical examination without abnormal findings: Secondary | ICD-10-CM | POA: Diagnosis not present

## 2015-01-05 NOTE — Progress Notes (Addendum)
Dr. Frederico Hamman T. , MD, [facility] [place], [address] Phone: [phone] Fax: [phone]  01/05/2015  Patient: Paula Luna, MRN: [id number], DOB: 1925/01/17, 81 y.o.  Primary Physician:  Owens Loffler, MD  Chief Complaint: Annual Exam  Subjective:   Paula Luna is a 81 y.o. pleasant patient who presents for a medicare wellness examination:  Health Maintenance Summary Reviewed and updated, unless pt declines services.  Still working at [employer]. Non-smoker Alcohol: No concerns, no excessive use Exercise Habits: rare activity, rec at least 30 mins 5 times a week STD concerns: none Drug Use: None Birth control method: n/a Menses regular: n/a Lumps or breast concerns: no Breast Cancer Family History: no  Health Maintenance  Topic Date Due  . TETANUS/TDAP  10/07/1945  . COLONOSCOPY  10/07/1976  . INFLUENZA VACCINE  04/13/2015  . DEXA SCAN  Completed  . ZOSTAVAX  Completed  . PNA vac Low Risk Adult  Completed   Immunization History  Administered Date(s) Administered  . Influenza Split 07/12/2012, 06/28/2013  . Influenza Whole 08/20/2007, 05/10/2010  . Influenza, High Dose Seasonal PF 08/14/2014  . Pneumococcal Conjugate-13 12/25/2013  . Pneumococcal Polysaccharide-23 05/10/2010  . Zoster 10/15/2012    Patient Active Problem List   Diagnosis Date Noted  . Obesity (BMI 30-39.9) 12/25/2013  . Essential hypertension, benign 12/25/2013  . BACK PAIN 08/02/2010  . VASOVAGAL SYNCOPE 05/20/2009  . FATIGUE 05/20/2009   Past Medical History  Diagnosis Date  . Osteoporosis   . Hypertension 12/25/2013   Past Surgical History  Procedure Laterality Date  . Total abdominal hysterectomy     History   Social History  . Marital Status: Married    Spouse Name: N/A  . Number of Children: N/A  . Years of Education: N/A   Occupational History  . Not on file.    Social History Main Topics  . Smoking status: Former Research scientist (life sciences)  . Smokeless tobacco: Never Used  . Alcohol Use: No  . Drug Use: No  . Sexual Activity: Not on file   Other Topics Concern  . Not on file   Social History Narrative   Widowed after 53 years   Working some at [employer] in [employer]         No family history on file. Allergies  Allergen Reactions  . Latex Rash  . Alendronate Sodium     REACTION: passed out  . Tetanus Toxoid     REACTION: anaphylaxis    Medication list has been reviewed and updated.   General: Denies fever, chills, sweats. No significant weight loss. Eyes: Denies blurring,significant itching ENT: Denies earache, sore throat, and hoarseness.  Cardiovascular: Denies chest pains, palpitations, dyspnea on exertion,  Respiratory: Denies cough, dyspnea at rest,wheeezing Breast: no concerns about lumps GI: Denies nausea, vomiting, diarrhea, constipation, change in bowel habits, abdominal pain, melena, hematochezia GU: Denies dysuria, hematuria, urinary hesitancy, nocturia, denies STD risk, no concerns about discharge Musculoskeletal: Denies back pain, joint pain Derm: Denies rash, itching Neuro: Denies  paresthesias, frequent falls, frequent headaches Psych: Denies depression, anxiety Endocrine: Denies cold intolerance, heat intolerance, polydipsia Heme: Denies enlarged lymph nodes Allergy: No hayfever  Objective:   BP 130/70 mmHg  Pulse 76  Temp(Src) 97.6 F (36.4 C) (Oral)  Ht 4' 11.5" (1.511 m)  Wt 153 lb 4 oz (69.514 kg)  BMI 30.45 kg/m2  The patient completed a fall screen and  PHQ-2 and PHQ-9 if necessary, which is documented in the EHR. The CMA/LPN/RN who assisted the patient verbally completed with them and documented results in [facility] EHR.   Hearing Screening   Method: Audiometry   125Hz 250Hz 500Hz 1000Hz 2000Hz 4000Hz 8000Hz  Right ear:   40 40 40 0   Left ear:   40 40 40 40   Vision Screening  Comments: Wears Glasses-Eye Exam 11/2014  GEN: well developed, well nourished, no acute distress Eyes: conjunctiva and lids normal, PERRLA, EOMI ENT: TM clear, nares clear, oral exam WNL Neck: supple, no lymphadenopathy, no thyromegaly, no JVD Pulm: clear to auscultation and percussion, respiratory effort normal CV: regular rate and rhythm, S1-S2, no murmur, rub or gallop, no bruits Chest: no scars, masses, no lumps BREAST: breast exam declined GI: soft, non-tender; no hepatosplenomegaly, masses; active bowel sounds all quadrants GU: GU exam declined Lymph: no cervical, axillary or inguinal adenopathy MSK: gait normal, muscle tone and strength WNL, no joint swelling, effusions, discoloration, crepitus  SKIN: clear, good turgor, color WNL, no rashes, lesions, or ulcerations Neuro: normal mental status, normal strength, sensation, and motion Psych: alert; oriented to person, place and time, normally interactive and not anxious or depressed in appearance.   All labs reviewed with patient. Lipids:    Component Value Date/Time   CHOL 181 12/26/2014 1448   TRIG 58.0 12/26/2014 1448   HDL 68.50 12/26/2014 1448   LDLDIRECT 101.2 05/20/2009 0000   VLDL 11.6 12/26/2014 1448   CHOLHDL 3 12/26/2014 1448   CBC: CBC Latest Ref Rng 12/26/2014 01/29/2014 10/08/2012  WBC 4.0 - 10.5 K/uL 5.9 7.0 6.7  Hemoglobin 12.0 - 15.0 g/dL 12.5 12.9 12.8  Hematocrit 36.0 - 46.0 % 37.8 39.4 38.5  Platelets 150.0 - 400.0 K/uL 207.0 216.0 382.5    Basic Metabolic Panel:    Component Value Date/Time   NA 141 12/26/2014 1448   K 4.8 12/26/2014 1448   CL 108 12/26/2014 1448   CO2 30 12/26/2014 1448   BUN 14 12/26/2014 1448   CREATININE 0.84 12/26/2014 1448   GLUCOSE 94 12/26/2014 1448   CALCIUM 9.3 12/26/2014 1448   Hepatic Function Latest Ref Rng 12/26/2014 01/29/2014 10/08/2012  Total Protein 6.0 - 8.3 g/dL 6.3 7.0 7.1  Albumin 3.5 - 5.2 g/dL 3.7 3.8 3.8  AST 0 - 37 U/L _0 ALT 0 - 35 U/L _1 Alk Phosphatase 39 - 117 U/L 74 61 73  Total Bilirubin 0.2 - 1.2 mg/dL 0.6 0.4 0.5  Bilirubin, Direct 0.0 - 0.3 mg/dL - 0.1 0.1    Lab Results  Component Value Date   TSH 1.14 12/26/2014    No results found.  Assessment and Plan:   Routine general medical examination at a health care facility  Health Maintenance Exam: The patient's preventative maintenance and recommended screening tests for an annual wellness exam were reviewed in full today. Brought up to date unless services declined.  Counselled on the importance of diet, exercise, and its role in overall health and mortality. The patient's FH and SH was reviewed, including their home life, tobacco status, and drug and alcohol status. Reviewed Updated provider list, see scanned forms.   I have personally reviewed the Medicare Annual Wellness questionnaire and have noted 1. The patient's medical and social history 2. Their use of alcohol, tobacco or illicit drugs 3. Their current medications and supplements 4. The patient's functional ability including ADL's, fall risks, home safety risks  and hearing or visual             impairment. 5. Diet and physical activities 6. Evidence for depression or mood disorders  The patients weight, height, BMI and visual acuity have been recorded in the chart I have made referrals, counseling and provided education to the patient based review of the above and I have provided the pt with a written personalized care plan for preventive services.  I have provided the patient with a copy of your personalized plan for preventive services. Instructed to take the time to review along with their updated medication list.  Doing very well.  Follow-up: Return in 1 year (on 01/05/2016). Or follow-up in 1 year for complete physical examination  New Prescriptions   No medications on file   No orders of the defined types were placed in this encounter.    Signed,  Maud Deed. Walaa Carel,  MD   Patient's Medications  New Prescriptions   No medications on file  Previous Medications   IBUPROFEN (ADVIL,MOTRIN) 200 MG TABLET    Take 200 mg by mouth as needed.   LISINOPRIL (PRINIVIL,ZESTRIL) 10 MG TABLET    TAKE ONE TABLET BY MOUTH ONCE DAILY   MULTIPLE VITAMINS-MINERALS (ICAPS) CAPS    Take 1 capsule by mouth daily.  Modified Medications   No medications on file  Discontinued Medications   No medications on file

## 2015-01-05 NOTE — Telephone Encounter (Signed)
Pt's daughter, Paula Luna called and says she usually come w/her Mom to her apptmts, however, today she has a funeral to attend and will not be able to come in w/her Mom, who has a CPE at 2:45 today.  Paula Luna would like for you to call her sometime today after her Mom's visit to discuss how things went, etc.  Thank you.

## 2015-01-05 NOTE — Telephone Encounter (Signed)
LMOM - mom is doing great.

## 2015-01-05 NOTE — Progress Notes (Signed)
Pre visit review using our clinic review tool, if applicable. No additional management support is needed unless otherwise documented below in the visit note. 

## 2015-01-06 ENCOUNTER — Other Ambulatory Visit: Payer: Self-pay | Admitting: *Deleted

## 2015-01-06 ENCOUNTER — Encounter: Payer: Self-pay | Admitting: Family Medicine

## 2015-01-06 DIAGNOSIS — H3530 Unspecified macular degeneration: Secondary | ICD-10-CM

## 2015-01-06 HISTORY — DX: Unspecified macular degeneration: H35.30

## 2015-01-06 MED ORDER — LISINOPRIL 10 MG PO TABS: 10.0000 mg | ORAL_TABLET | Freq: Every day | ORAL | Status: DC

## 2015-01-07 ENCOUNTER — Other Ambulatory Visit: Payer: Self-pay | Admitting: *Deleted

## 2015-01-07 MED ORDER — LISINOPRIL 10 MG PO TABS: 10.0000 mg | ORAL_TABLET | Freq: Every day | ORAL | Status: DC

## 2015-01-12 ENCOUNTER — Encounter: Payer: Self-pay | Admitting: Family Medicine

## 2015-01-31 ENCOUNTER — Emergency Department: Payer: PPO

## 2015-01-31 ENCOUNTER — Emergency Department
Admission: EM | Admit: 2015-01-31 | Discharge: 2015-01-31 | Disposition: A | Discharge status: Home / Self Care | Payer: PPO | Attending: Emergency Medicine | Admitting: Emergency Medicine

## 2015-01-31 ENCOUNTER — Encounter: Payer: Self-pay | Admitting: Emergency Medicine

## 2015-01-31 DIAGNOSIS — Z79899 Other long term (current) drug therapy: Secondary | ICD-10-CM | POA: Insufficient documentation

## 2015-01-31 DIAGNOSIS — I10 Essential (primary) hypertension: Secondary | ICD-10-CM | POA: Insufficient documentation

## 2015-01-31 DIAGNOSIS — Z87891 Personal history of nicotine dependence: Secondary | ICD-10-CM | POA: Insufficient documentation

## 2015-01-31 DIAGNOSIS — W010XXA Fall on same level from slipping, tripping and stumbling without subsequent striking against object, initial encounter: Secondary | ICD-10-CM | POA: Insufficient documentation

## 2015-01-31 DIAGNOSIS — W19XXXA Unspecified fall, initial encounter: Secondary | ICD-10-CM

## 2015-01-31 DIAGNOSIS — Y92003 Bedroom of unspecified non-institutional (private) residence as the place of occurrence of the external cause: Secondary | ICD-10-CM | POA: Diagnosis not present

## 2015-01-31 DIAGNOSIS — Y9301 Activity, walking, marching and hiking: Secondary | ICD-10-CM | POA: Insufficient documentation

## 2015-01-31 DIAGNOSIS — S42292A Other displaced fracture of upper end of left humerus, initial encounter for closed fracture: Secondary | ICD-10-CM | POA: Insufficient documentation

## 2015-01-31 DIAGNOSIS — M25512 Pain in left shoulder: Secondary | ICD-10-CM | POA: Diagnosis present

## 2015-01-31 DIAGNOSIS — Y998 Other external cause status: Secondary | ICD-10-CM | POA: Insufficient documentation

## 2015-01-31 DIAGNOSIS — S4292XA Fracture of left shoulder girdle, part unspecified, initial encounter for closed fracture: Secondary | ICD-10-CM

## 2015-01-31 DIAGNOSIS — Z91040 Latex allergy status: Secondary | ICD-10-CM | POA: Insufficient documentation

## 2015-01-31 MED ORDER — TRAMADOL HCL 50 MG PO TABS
ORAL_TABLET | ORAL | Status: AC
  Filled 2015-01-31: qty 1

## 2015-01-31 MED ORDER — TRAMADOL HCL 50 MG PO TABS
25.0000 mg | ORAL_TABLET | Freq: Once | ORAL | Status: AC
  Administered 2015-01-31: 25 mg via ORAL

## 2015-01-31 MED ORDER — TRAMADOL HCL 50 MG PO TABS: 25.0000 mg | ORAL_TABLET | Freq: Three times a day (TID) | ORAL | Status: DC | PRN

## 2015-01-31 NOTE — Discharge Instructions (Signed)
Take medication as prescribed. Apply ice. Keep in sling.   Follow up with orthopedic next week. See above to call Monday to schedule.   Return to ER for new or worsening concerns.   Shoulder Fracture You have a fractured humerus (bone in the upper arm) at the shoulder just below the ball of the shoulder joint. Most of the time the bones of a broken shoulder are in an acceptable position. Usually the injury can be treated with a shoulder immobilizer or sling and swath bandage. These devices support the arm and prevent any shoulder movement. If the bones are not in a good position, then surgery is sometimes needed. Shoulder fractures usually cause swelling, pain, and discoloration around the upper arm initially. They heal in 8-12 weeks with proper treatment. Rest in bed or a reclining chair as long as your shoulder is very painful. Sitting up generally results in less pain at the fracture site. Do not remove your shoulder bandage until your caregiver approves. You may apply ice packs over the shoulder for 20-30 minutes every 2 hours for the next 2-3 days to reduce the pain and swelling. Use your pain medicine as prescribed.  SEEK IMMEDIATE MEDICAL CARE IF:  You develop severe shoulder pain unrelieved by rest and taking pain medicine.  You have pain, numbness, tingling, or weakness in the hand or wrist.  You develop shortness of breath, chest pain, severe weakness, or fainting.  You have severe pain with motion of the fingers or wrist. MAKE SURE YOU:   Understand these instructions.  Will watch your condition.  Will get help right away if you are not doing well or get worse. Document Released: 10/06/2004 Document Revised: 11/21/2011 Document Reviewed: 12/17/2008 [facility] Patient Information 2015 [place], [place]. This information is not intended to replace advice given to you by your health care provider. Make sure you discuss any questions you have with your health care provider.  Fall  Prevention and Home Safety Falls cause injuries and can affect all age groups. It is possible to prevent falls.  HOW TO PREVENT FALLS  Wear shoes with rubber soles that do not have an opening for your toes.  Keep the inside and outside of your house well lit.  Use night lights throughout your home.  Remove clutter from floors.  Clean up floor spills.  Remove throw rugs or fasten them to the floor with carpet tape.  Do not place electrical cords across pathways.  Put grab bars by your tub, shower, and toilet. Do not use towel bars as grab bars.  Put handrails on both sides of the stairway. Fix loose handrails.  Do not climb on stools or stepladders, if possible.  Do not wax your floors.  Repair uneven or unsafe sidewalks, walkways, or stairs.  Keep items you use a lot within reach.  Be aware of pets.  Keep emergency numbers next to the telephone.  Put smoke detectors in your home and near bedrooms. Ask your doctor what other things you can do to prevent falls. Document Released: 06/25/2009 Document Revised: 02/28/2012 Document Reviewed: 11/29/2011 [facility] Patient Information 2015 [place], [place]. This information is not intended to replace advice given to you by your health care provider. Make sure you discuss any questions you have with your health care provider.

## 2015-01-31 NOTE — ED Notes (Signed)
Patient to ED with c/o left shoulder pain. Patient reports tripping over bedpost and falling landing on that shoulder, heard pop. Happened just pta.

## 2015-01-31 NOTE — ED Provider Notes (Signed)
[facility] Emergency Department Provider Note ____________________________________________  Time seen: Approximately 4:57 PM  I have reviewed the triage vital signs and the nursing notes.   HISTORY  Chief Complaint Shoulder Injury   HPI Paula Luna is a 81 y.o. female presents to the ER with daughter at bedside for the complaints of left shoulder pain post fall. Patient states the fall was just prior to arrival. Patient states that she was trying to grab her clothes quickly as grandson was knocking at the door and she was walking around the end of her bed. States hit left foot on a bed post, tripped, and fell directly on left shoulder. Patient states she tried to catch herself with left arm but was unable to and hit left shoulder. Patient states unsure if hit head, but denies loss of consciousness. Patient states she was able to quickly get herself up. Patient states felt fine just prior to fall.  Denies neck pain, back pain, chest pain, shortness of breath, weakness, dizziness, nausea, vomiting, diarrhea. Patient states she is having left shoulder pain and pain with attempted range of motion. Patient states pain is 6 out of 10 worse with movement. Denies any tingling or numbness in left arm. Patient states she heard a pop when she fell.  Denies headache, vision changes, weakness or behavior changes.    Past Medical History  Diagnosis Date  . Osteoporosis   . Hypertension 12/25/2013  . Macular degeneration of left eye 01/06/2015    Patient Active Problem List   Diagnosis Date Noted  . Macular degeneration of left eye 01/06/2015  . Obesity (BMI 30-39.9) 12/25/2013  . Essential hypertension, benign 12/25/2013  . BACK PAIN 08/02/2010    Past Surgical History  Procedure Laterality Date  . Total abdominal hysterectomy      Current Outpatient Rx  Name  Route  Sig  Dispense  Refill  . ibuprofen (ADVIL,MOTRIN) 200 MG tablet   Oral   Take 200 mg by  mouth as needed.         Marland Kitchen lisinopril (PRINIVIL,ZESTRIL) 10 MG tablet   Oral   Take 1 tablet (10 mg total) by mouth daily.   90 tablet   3   . Multiple Vitamins-Minerals (ICAPS) CAPS   Oral   Take 1 capsule by mouth daily.           Allergies Latex; Alendronate sodium; and Tetanus toxoid  History reviewed. No pertinent family history.  Social History History  Substance Use Topics  . Smoking status: Former Games developer  . Smokeless tobacco: Never Used  . Alcohol Use: No   Patient reports she was at home alone. Patient states that she ambulates well and does not use any assistive devices such as walker or cane at home.  Review of Systems Constitutional: No fever/chills Eyes: No visual changes. ENT: No sore throat. Cardiovascular: Denies chest pain. Respiratory: Denies shortness of breath. Gastrointestinal: No abdominal pain.  No nausea, no vomiting.  No diarrhea.  No constipation. Genitourinary: Negative for dysuria. Musculoskeletal: Negative for back pain. Positive for left shoulder pain. Positive for mild left wrist pain. Skin: Negative for rash. Neurological: Negative for headaches, focal weakness or numbness.  10-point ROS otherwise negative.  ____________________________________________   PHYSICAL EXAM:  VITAL SIGNS: ED Triage Vitals  Enc Vitals Group     BP 01/31/15 1641 161/68 mmHg     Pulse Rate 01/31/15 1641 71     Resp 01/31/15 1641 20     Temp 01/31/15  1641 97.8 F (36.6 C)     Temp Source 01/31/15 1641 Oral     SpO2 01/31/15 1641 98 %     Weight 01/31/15 1641 150 lb (68.04 kg)     Height 01/31/15 1641  (1.499 m)     Head Cir --      Peak Flow --      Pain Score 01/31/15 1642 6     Pain Loc --      Pain Edu? --      Excl. in GC? --     Constitutional: Alert and oriented. Well appearing and in no acute distress. Eyes: Conjunctivae are normal. PERRL. EOMI. Head: Atraumatic. Nose: No congestion/rhinnorhea. Mouth/Throat: Mucous membranes  are moist.  Oropharynx non-erythematous. Neck: No stridor.  No cervical spine tenderness to palpation. Full ROM. No pain with ROM.  Hematological/Lymphatic/Immunilogical: No cervical lymphadenopathy. Cardiovascular: Normal rate, regular rhythm. Grossly normal heart sounds.  Good peripheral circulation. Respiratory: Normal respiratory effort.  No retractions. Lungs CTAB. Gastrointestinal: Soft and nontender. No distention. No abdominal bruits. No CVA tenderness. Musculoskeletal: No lower extremity tenderness nor edema.  No joint effusions.No foot tenderness. Full ROM to bilateral lower extremities. Bilateral distal pedal pulses equal bilaterally. Left shoulder mod TTP, +deformity. ROM restricted.   Left elbow Full ROM, no pain with ROM. Left distal wrist mild TTP, mild pain with rotation, but full ROM present. Bilateral distal radial pulses equal bilaterally and easily found. Full ROM below left shoulder. Bilateral hand grips equal and strong. Neurologic:  Normal speech and language. No gross focal neurologic deficits are appreciated. Speech is normal. No gait instability. Skin:  Skin is warm, dry and intact. No rash noted. Psychiatric: Mood and affect are normal. Speech and behavior are normal.  ____________________________________________ ____________________________________  RADIOLOGY  CT HEAD WITHOUT CONTRAST  TECHNIQUE: Contiguous axial images were obtained from the base of the skull through the vertex without intravenous contrast.  COMPARISON: None.  FINDINGS: There is no evidence of acute infarction, mass lesion, or intra- or extra-axial hemorrhage on CT.  Prominence of the sulci suggests mild to moderate cortical volume loss. Mild cerebellar atrophy is noted. Mild periventricular and subcortical white matter change likely reflects small vessel ischemic microangiopathy.  The brainstem and fourth ventricle are within normal limits. The basal ganglia are unremarkable in  appearance. The cerebral hemispheres demonstrate grossly normal gray-white differentiation. No mass effect or midline shift is seen.  There is no evidence of fracture; visualized osseous structures are unremarkable in appearance. The visualized portions of the orbits are within normal limits. The paranasal sinuses and mastoid air cells are well-aerated. No significant soft tissue abnormalities are seen.  IMPRESSION: 1. No evidence of traumatic intracranial injury or fracture. 2. Mild to moderate cortical volume loss and scattered small vessel ischemic microangiopathy noted.   Electronically Signed By: Roanna Raider M.D. On: 01/31/2015 18:09  LEFT WRIST - COMPLETE 3+ VIEW  COMPARISON: None.  FINDINGS: Degenerative changes at the first carpometacarpal joint. No acute bony abnormality. Specifically, no fracture, subluxation, or dislocation. Soft tissues are intact.  IMPRESSION: No acute bony abnormality.   Electronically Signed By: Charlett Nose M.D. On: 01/31/2015 17:57 LEFT SHOULDER - 2+ VIEW  COMPARISON: None.  FINDINGS: There is a comminuted fracture through the left humeral head and neck, with mild impaction. The left humeral head remains seated at the glenoid fossa. No additional fractures are seen.  Mild degenerative change is noted at the left acromioclavicular joint. The visualized portions of the lungs are grossly clear.  IMPRESSION: Comminuted fracture through the left humeral head and neck, with mild impaction.   Electronically Signed By: Roanna RaiderJeffery Chang M.D. On: 01/31/2015 17:41 ____________________________________________   PROCEDURES  Procedure(s) performed: SPLINT APPLICATION Date/Time: 6:41 PM Authorized by: Renford DillsLindsey Blair Mesina Consent: Verbal consent obtained. Risks and benefits: risks, benefits and alternatives were discussed Consent given by: patient Splint applied by: ed technician Location details: left shoulder  Splint  type: sling Post-procedure: The splinted body part was neurovascularly unchanged following the procedure. Patient tolerance: Patient tolerated the procedure well with no immediate complications.     ____________________________________________   INITIAL IMPRESSION / ASSESSMENT AND PLAN / ED COURSE  Pertinent labs & imaging results that were available during my care of the patient were reviewed by me and considered in my medical decision making (see chart for details).  No acute distress. Very well-appearing patient. Presents to the ER status post mechanical fall with complaints of left shoulder pain. Alert and oriented. No focal neurological deficits.left shoulder x-ray positive for fracture. Left shoulder x-ray comminuted fracture through the left humeral head and neck with mild impaction. Left distal arm sensation intact with good equal palpable radial pulse.   Discussed patient and plan of care with Dr. Carollee MassedKaminski. Will place patient in left sling for immobilization. Patient to rest arm apply ice and to follow-up with orthopedic next week. When necessary pain medication tramadol. Discussed follow up and return parameters with the patient and family. Family verbalized they will assist patient at home. Patient verbalizes understanding.  ____________________________________________   FINAL CLINICAL IMPRESSION(S) / ED DIAGNOSES  Final diagnoses:  Shoulder fracture, left, closed, initial encounter  Fall, initial encounter     Renford DillsLindsey Meer Reindl, NP 01/31/15 1843  Darien Ramusavid W Kaminski, MD 01/31/15 2123

## 2015-02-03 ENCOUNTER — Telehealth: Payer: Self-pay | Admitting: Family Medicine

## 2015-02-03 NOTE — Telephone Encounter (Signed)
Pt's daughter, Thurston Holenne called and says pt fell Saturday afternoon, 01/31/2015 and [facility] [place]  ED says she broke her left shoulder and gave her Tramadol 50 mg and sent her home.  She has an apptmt Thursday, 02/05/2015 at 11:30 a.m. [facility]  Thurston Holenne just wanted to be sure you are aware of this. She will call w/an update after the orthopedic apptmt Thursday. Thank you.

## 2015-04-13 ENCOUNTER — Other Ambulatory Visit: Payer: Self-pay | Admitting: Orthopedic Surgery

## 2015-04-13 DIAGNOSIS — R29898 Other symptoms and signs involving the musculoskeletal system: Secondary | ICD-10-CM

## 2015-04-13 DIAGNOSIS — S42252S Displaced fracture of greater tuberosity of left humerus, sequela: Secondary | ICD-10-CM

## 2015-04-20 ENCOUNTER — Ambulatory Visit: Payer: PPO

## 2015-09-13 HISTORY — PX: CLOSED REDUCTION PROXIMAL HUMERUS FRACTURE: SUR233

## 2015-10-16 ENCOUNTER — Telehealth: Payer: Self-pay | Admitting: *Deleted

## 2015-10-16 NOTE — Telephone Encounter (Signed)
I am ok doing this. Paula Luna is almost 82 years old and she still works at [employer].   [name], can you help write a work note for her with return to work 10/17/2015?  Thanks.

## 2015-10-16 NOTE — Telephone Encounter (Signed)
Letter faxed to [phone] as requested.  Will forward to Dr. Patsy Lager to let him know they will be faxing over [facility] for him to complete.

## 2015-10-16 NOTE — Telephone Encounter (Signed)
Letter written and placed up front for pick up.[name] (daughter) notified letter is ready to be picked up at the front desk.

## 2015-10-16 NOTE — Telephone Encounter (Signed)
Pt's daughter left voicemail at Triage, pt is still working at age 82, pt took off 3 days from work because she was moving in with her daughter, she was tired, she has an cold and she was constipated, pt is going back to work tomorrow 10/17/15 but her job is requiring her to bring a work note. Daughter request work note and call back once it's done

## 2015-10-16 NOTE — Telephone Encounter (Signed)
Patient daughter,Paula Luna,called.  Patient needs letter faxed to her work-fax [phone].  Patient's daughter said Loletta Parish will be faxing over FMLA paperwork for 10/13/15-10/15/15.

## 2015-10-19 ENCOUNTER — Telehealth: Payer: Self-pay | Admitting: Family Medicine

## 2015-10-19 NOTE — Telephone Encounter (Signed)
fmla paperwork was faxed from [employer]  Spoke to pt and her daughter Daughter stated Paula Luna was out of work 10/13/15-10/14/15-10/15/15 Because of constipation and a very bad cold She took myralox stool softener for the constipation and stayed home to rest [facility] is requesting fmla for the 3 days she missed Pt didn't go see a dr for this  Paperwork in Dr copland's IN BOX  For review and signature

## 2015-10-21 NOTE — Telephone Encounter (Signed)
Left message letting pt know fmla paperwork has been faxed and copy here for her  Copy for scan Copy for file Copy for pt

## 2015-12-04 DIAGNOSIS — H16223 Keratoconjunctivitis sicca, not specified as Sjogren's, bilateral: Secondary | ICD-10-CM | POA: Diagnosis not present

## 2015-12-04 DIAGNOSIS — H26491 Other secondary cataract, right eye: Secondary | ICD-10-CM | POA: Diagnosis not present

## 2015-12-04 DIAGNOSIS — H16213 Exposure keratoconjunctivitis, bilateral: Secondary | ICD-10-CM | POA: Diagnosis not present

## 2015-12-04 DIAGNOSIS — H5211 Myopia, right eye: Secondary | ICD-10-CM | POA: Diagnosis not present

## 2015-12-04 DIAGNOSIS — Z961 Presence of intraocular lens: Secondary | ICD-10-CM | POA: Diagnosis not present

## 2015-12-04 DIAGNOSIS — H35363 Drusen (degenerative) of macula, bilateral: Secondary | ICD-10-CM | POA: Diagnosis not present

## 2015-12-22 DIAGNOSIS — H26491 Other secondary cataract, right eye: Secondary | ICD-10-CM | POA: Diagnosis not present

## 2015-12-28 ENCOUNTER — Telehealth: Payer: Self-pay

## 2015-12-28 NOTE — Telephone Encounter (Signed)
[name] left v/m; request cb prior to pt coming in for CPX on 01/06/16.I left v/m requesting cb from [name].

## 2015-12-28 NOTE — Telephone Encounter (Signed)
[name] called back (DPR signed); pt is still working full time; [name] wanted Dr Patsy Lageropland to know since pt has moved in with Dewayne HatchAnn 2 months ago; on and off pt has delayed reaction to answer Farrell OursAnn; [name] has asked pt if she heard her and pt said yes. Pt told Dewayne Hatchnn pt needs the wax removed from her ears. Pt sits a lot with eyes closed; pt does not plan to retire from work; Dewayne Hatchnn is concerned maybe the beginnings of altzheimers or it could just be her hearing. FYI to Dr Patsy Lageropland; [name] DOES NOT want pt to know that she called and advised Dr Patsy Lageropland of this problem. Dewayne Hatchnn thinks since pt coming in for CPX possibly Dr Patsy Lageropland could check pts hearing without mentioning this call because pt would be upset with Dewayne HatchAnn if pt knew that she called.

## 2015-12-30 ENCOUNTER — Other Ambulatory Visit (INDEPENDENT_AMBULATORY_CARE_PROVIDER_SITE_OTHER): Payer: PPO

## 2015-12-30 ENCOUNTER — Other Ambulatory Visit: Payer: PPO

## 2015-12-30 DIAGNOSIS — I10 Essential (primary) hypertension: Secondary | ICD-10-CM

## 2015-12-30 DIAGNOSIS — R5383 Other fatigue: Secondary | ICD-10-CM

## 2015-12-30 LAB — LIPID PANEL
CHOL/HDL RATIO: 3
Cholesterol: 193 mg/dL (ref 0–200)
HDL: 70.4 mg/dL (ref >39.00)
LDL Cholesterol: 109 mg/dL — ABNORMAL HIGH (ref 0–99)
NonHDL: 122.7
Triglycerides: 69 mg/dL (ref 0.0–149.0)
VLDL: 13.8 mg/dL (ref 0.0–40.0)

## 2015-12-30 LAB — CBC WITH DIFFERENTIAL/PLATELET
BASOS PCT: 0.5 % (ref 0.0–3.0)
Basophils Absolute: 0 10*3/uL (ref 0.0–0.1)
EOS PCT: 2 % (ref 0.0–5.0)
Eosinophils Absolute: 0.1 10*3/uL (ref 0.0–0.7)
HEMATOCRIT: 39.6 % (ref 36.0–46.0)
HEMOGLOBIN: 12.8 g/dL (ref 12.0–15.0)
LYMPHS PCT: 34.2 % (ref 12.0–46.0)
Lymphs Abs: 2.1 10*3/uL (ref 0.7–4.0)
MCHC: 32.4 g/dL (ref 30.0–36.0)
MCV: 78.6 fl (ref 78.0–100.0)
Monocytes Absolute: 0.5 10*3/uL (ref 0.1–1.0)
Monocytes Relative: 7.5 % (ref 3.0–12.0)
Neutro Abs: 3.4 10*3/uL (ref 1.4–7.7)
Neutrophils Relative %: 55.8 % (ref 43.0–77.0)
Platelets: 217 10*3/uL (ref 150.0–400.0)
RBC: 5.04 Mil/uL (ref 3.87–5.11)
RDW: 15.6 % — AB (ref 11.5–15.5)
WBC: 6.1 10*3/uL (ref 4.0–10.5)

## 2015-12-30 LAB — HEPATIC FUNCTION PANEL
ALBUMIN: 4 g/dL (ref 3.5–5.2)
ALT: 10 U/L (ref 0–35)
AST: 16 U/L (ref 0–37)
Alkaline Phosphatase: 70 U/L (ref 39–117)
BILIRUBIN TOTAL: 0.5 mg/dL (ref 0.2–1.2)
Bilirubin, Direct: 0.1 mg/dL (ref 0.0–0.3)
Total Protein: 6.9 g/dL (ref 6.0–8.3)

## 2015-12-30 LAB — BASIC METABOLIC PANEL
BUN: 19 mg/dL (ref 6–23)
CHLORIDE: 108 meq/L (ref 96–112)
CO2: 28 mEq/L (ref 19–32)
Calcium: 9.5 mg/dL (ref 8.4–10.5)
Creatinine, Ser: 0.89 mg/dL (ref 0.40–1.20)
GFR: 63.44 mL/min (ref >60.00)
Glucose, Bld: 94 mg/dL (ref 70–99)
Potassium: 4.1 mEq/L (ref 3.5–5.1)
Sodium: 143 mEq/L (ref 135–145)

## 2015-12-30 LAB — TSH: TSH: 1.3 u[IU]/mL (ref 0.35–4.50)

## 2016-01-06 ENCOUNTER — Encounter: Payer: PPO | Admitting: Family Medicine

## 2016-01-13 ENCOUNTER — Encounter: Payer: PPO | Admitting: Family Medicine

## 2016-01-18 ENCOUNTER — Encounter: Payer: PPO | Admitting: Family Medicine

## 2016-01-20 ENCOUNTER — Ambulatory Visit (INDEPENDENT_AMBULATORY_CARE_PROVIDER_SITE_OTHER): Payer: PPO | Admitting: Family Medicine

## 2016-01-20 ENCOUNTER — Encounter: Payer: Self-pay | Admitting: Family Medicine

## 2016-01-20 VITALS — BP 152/83 | HR 83 | Temp 98.6°F | Ht 59.5 in | Wt 148.0 lb

## 2016-01-20 DIAGNOSIS — Z0000 Encounter for general adult medical examination without abnormal findings: Secondary | ICD-10-CM

## 2016-01-20 NOTE — Patient Instructions (Signed)
GETTING TO GOOD BOWEL HEALTH. Irregular bowel habits such as constipation and diarrhea can lead to many problems over time.  Having one soft bowel movement a day is the most important way to prevent further problems.  The anorectal canal is designed to handle stretching and feces to safely manage our ability to get rid of solid waste (feces, poop, stool) out of our body.  BUT, hard constipated stools can act like ripping concrete bricks and diarrhea can be a burning fire to this very sensitive area of our body, causing inflamed hemorrhoids, anal fissures, increasing risk is perirectal abscesses, abdominal pain/bloating, an making irritable bowel worse.     The goal: ONE SOFT BOWEL MOVEMENT A DAY!  To have soft, regular bowel movements:    . Drink at least 8 tall glasses of water a day.   .  . Take plenty of fiber.  Fiber is the undigested part of plant food that passes into the colon, acting s "natures broom" to encourage bowel motility and movement.  Fiber can absorb and hold large amounts of water. This results in a larger, bulkier stool, which is soft and easier to pass. Work gradually over several weeks up to 6 servings a day of fiber (25g a day even more if needed) in the form of: o Vegetables -- Root (potatoes, carrots, turnips), leafy green (lettuce, salad greens, celery, spinach), or cooked high residue (cabbage, broccoli, etc) o Fruit -- Fresh (unpeeled skin & pulp), Dried (prunes, apricots, cherries, etc ),  or stewed ( applesauce)  o Whole grain breads, pasta, etc (whole wheat)  o Bran cereals  o  . Bulking Agents -- This type of water-retaining fiber generally is easily obtained each day by one of the following:  o Psyllium bran -- The psyllium plant is remarkable because its ground seeds can retain so much water. This product is available as Metamucil, Konsyl, Effersyllium, Per Diem Fiber, or the less expensive generic preparation in drug and health food stores. Although labeled a  laxative, it really is not a laxative.  o Methylcellulose -- This is another fiber derived from wood which also retains water. It is available as Citrucel. o Flax Seed - a less gassy fiber than psyllium . No reading or other relaxing activity while on the toilet. If bowel movements take longer than 5 minutes, you are too constipated . AVOID CONSTIPATION.  High fiber and water intake usually takes care of this.  Sometimes a laxative is needed to stimulate more frequent bowel movements, but  .  Marland Kitchen. Laxatives are not a good long-term solution as it can wear the colon out. o Osmotics (Milk of Magnesia, Fleets phosphosoda, Magnesium citrate, MiraLax, GoLytely) are safer than  o Stimulants (Senokot, Castor Oil, Dulcolax, Ex Lax)    o Do not take laxatives for more than 7days in a row. o  .  IF SEVERELY CONSTIPATED, try a Bowel Retraining Program: o Do not use laxatives.  o Eat a diet high in roughage, such as bran cereals and leafy vegetables.  o Drink six (6) ounces of prune or apricot juice each morning.  o Eat two (2) large servings of stewed fruit each day.  o Take one (1) heaping tablespoon of a psyllium-based bulking agent (Metamucal or Citrucel) twice a day. Use sugar-free sweetener when possible to avoid excessive calories.  o Eat a normal breakfast.  o Set aside 15 minutes after breakfast to sit on the toilet, but do not strain to have a bowel movement.  o If you do not have a bowel movement by the third day, use an enema and repeat the above steps. (OR MIRALAX) -

## 2016-01-20 NOTE — Progress Notes (Signed)
Pre visit review using our clinic review tool, if applicable. No additional management support is needed unless otherwise documented below in the visit note. 

## 2016-01-20 NOTE — Progress Notes (Signed)
Dr. Frederico Hamman T. Gaspare Netzel, MD, [facility] [place], [address] Phone: [phone] Fax: [phone]  01/20/2016  Patient: Paula Luna, MRN: [id number], DOB: 03/13/1925, 82 y.o.  Primary Physician:  Owens Loffler, MD   Chief Complaint  Patient presents with  . Annual Exam    Medicare Wellness   Subjective:   Paula Luna is a 82 y.o. pleasant patient who presents for a medicare wellness examination:  Health Maintenance Summary Reviewed and updated, unless pt declines services.  Tobacco History Reviewed. Non-smoker Alcohol: No concerns, no excessive use Exercise Habits: Some activity, rec at least 30 mins 5 times a week STD concerns: none Drug Use: None Birth control method: n/a Menses regular: n/a Lumps or breast concerns: no Breast Cancer Family History: no  Still working at [employer]  Topic Date Due  . Samul Dada  10/07/1943  . INFLUENZA VACCINE  04/12/2016  . DEXA SCAN  Completed  . ZOSTAVAX  Completed  . PNA vac Low Risk Adult  Completed   Immunization History  Administered Date(s) Administered  . Influenza Split 07/12/2012, 06/28/2013  . Influenza Whole 08/20/2007, 05/10/2010  . Influenza, High Dose Seasonal PF 08/14/2014, 07/29/2015  . Pneumococcal Conjugate-13 12/25/2013  . Pneumococcal Polysaccharide-23 05/10/2010  . Zoster 10/15/2012    Patient Active Problem List   Diagnosis Date Noted  . Macular degeneration of left eye 01/06/2015  . Obesity (BMI 30-39.9) 12/25/2013  . Essential hypertension, benign 12/25/2013  . BACK PAIN 08/02/2010   Past Medical History  Diagnosis Date  . Osteoporosis   . Hypertension 12/25/2013  . Macular degeneration of left eye 01/06/2015   Past Surgical History  Procedure Laterality Date  . Total abdominal hysterectomy     Social History   Social History  . Marital Status: Married    Spouse Name: N/A  . Number of Children:  N/A  . Years of Education: N/A   Occupational History  . Not on file.   Social History Main Topics  . Smoking status: Former Research scientist (life sciences)  . Smokeless tobacco: Never Used  . Alcohol Use: No  . Drug Use: No  . Sexual Activity: Not on file   Other Topics Concern  . Not on file   Social History Narrative   Widowed after 70 years   Working some at [employer] in [employer]         No family history on file. Allergies  Allergen Reactions  . Latex Rash  . Alendronate Sodium     REACTION: passed out  . Tetanus Toxoid     REACTION: anaphylaxis    Medication list has been reviewed and updated.   General: Denies fever, chills, sweats. No significant weight loss. Eyes: Denies blurring,significant itching ENT: Denies earache, sore throat, and hoarseness.  Cardiovascular: Denies chest pains, palpitations, dyspnea on exertion,  Respiratory: Denies cough, dyspnea at rest,wheeezing Breast: no concerns about lumps GI: CONSTIPATION GU: Denies dysuria, hematuria, urinary hesitancy, nocturia, denies STD risk, no concerns about discharge Musculoskeletal: Denies back pain, joint pain - L PINKY TOE HURTING SOME Derm: Denies rash, itching Neuro: Denies  paresthesias, frequent falls, frequent headaches Psych: Denies depression, anxiety Endocrine: Denies cold intolerance, heat intolerance, polydipsia Heme: Denies enlarged lymph nodes Allergy: No hayfever  Objective:   BP 152/83 mmHg  Pulse 83  Temp(Src) 98.6 F (37 C) (Oral)  Ht 4' 11.5" (1.511 m)  Wt 148 lb (67.132 kg)  BMI 29.40  kg/m2  The patient completed a fall screen and PHQ-2 and PHQ-9 if necessary, which is documented in the EHR. The CMA/LPN/RN who assisted the patient verbally completed with them and documented results in [facility] EHR.   Hearing Screening   Method: Audiometry   '125Hz'  '250Hz'  '500Hz'  '1000Hz'  '2000Hz'  '4000Hz'  '8000Hz'   Right ear:   '20 20 20 20   ' Left ear:   '25 25 25 25   ' Vision Screening Comments:  Eye Exam with Dr. Sarita Haver at [facility] 12/04/2015  GEN: well developed, well nourished, no acute distress Eyes: conjunctiva and lids normal, PERRLA, EOMI ENT: TM clear, nares clear, oral exam WNL Neck: supple, no lymphadenopathy, no thyromegaly, no JVD Pulm: clear to auscultation and percussion, respiratory effort normal CV: regular rate and rhythm, S1-S2, no murmur, rub or gallop, no bruits Chest: no scars, masses, no lumps BREAST: breast exam declined GI: soft, non-tender; no hepatosplenomegaly, masses; active bowel sounds all quadrants GU: GU exam declined Lymph: no cervical, axillary or inguinal adenopathy MSK: gait normal, muscle tone and strength WNL, no joint swelling, effusions, discoloration, crepitus  SKIN: clear, good turgor, color WNL, no rashes, lesions, or ulcerations Neuro: normal mental status, normal strength, sensation, and motion Psych: alert; oriented to person, place and time, normally interactive and not anxious or depressed in appearance.   All labs reviewed with patient. Lipids:    Component Value Date/Time   CHOL 193 12/30/2015 1212   TRIG 69.0 12/30/2015 1212   HDL 70.40 12/30/2015 1212   LDLDIRECT 101.2 05/20/2009 0000   VLDL 13.8 12/30/2015 1212   CHOLHDL 3 12/30/2015 1212   CBC: CBC Latest Ref Rng 12/30/2015 12/26/2014 01/29/2014  WBC 4.0 - 10.5 K/uL 6.1 5.9 7.0  Hemoglobin 12.0 - 15.0 g/dL 12.8 12.5 12.9  Hematocrit 36.0 - 46.0 % 39.6 37.8 39.4  Platelets 150.0 - 400.0 K/uL 217.0 207.0 751.0    Basic Metabolic Panel:    Component Value Date/Time   NA 143 12/30/2015 1212   K 4.1 12/30/2015 1212   CL 108 12/30/2015 1212   CO2 28 12/30/2015 1212   BUN 19 12/30/2015 1212   CREATININE 0.89 12/30/2015 1212   GLUCOSE 94 12/30/2015 1212   CALCIUM 9.5 12/30/2015 1212   Hepatic Function Latest Ref Rng 12/30/2015 12/26/2014 01/29/2014  Total Protein 6.0 - 8.3 g/dL 6.9 6.3 7.0  Albumin 3.5 - 5.2 g/dL 4.0 3.7 3.8  AST 0 - 37 U/L '16 14 21  ' ALT 0 -  35 U/L '10 10 14  ' Alk Phosphatase 39 - 117 U/L 70 74 61  Total Bilirubin 0.2 - 1.2 mg/dL 0.5 0.6 0.4  Bilirubin, Direct 0.0 - 0.3 mg/dL 0.1 - 0.1    Lab Results  Component Value Date   TSH 1.30 12/30/2015    No results found.  Assessment and Plan:   Routine general medical examination at a health care facility   She will check her bp every other day now and collect data  Health Maintenance Exam: The patient's preventative maintenance and recommended screening tests for an annual wellness exam were reviewed in full today. Brought up to date unless services declined.  Counselled on the importance of diet, exercise, and its role in overall health and mortality. The patient's FH and SH was reviewed, including their home life, tobacco status, and drug and alcohol status.  I have personally reviewed the Medicare Annual Wellness questionnaire and have noted 1. The patient's medical and social history 2. Their use of alcohol, tobacco or  illicit drugs 3. Their current medications and supplements 4. The patient's functional ability including ADL's, fall risks, home safety risks and hearing or visual             impairment. 5. Diet and physical activities 6. Evidence for depression or mood disorders 7. Reviewed Updated provider list, see scanned forms and [facility] Snapshot.   The patients weight, height, BMI and visual acuity have been recorded in the chart I have made referrals, counseling and provided education to the patient based review of the above and I have provided the pt with a written personalized care plan for preventive services.  I have provided the patient with a copy of your personalized plan for preventive services. Instructed to take the time to review along with their updated medication list.  Follow-up: No Follow-up on file. Or follow-up in 1 year for complete physical examination  Signed,  Frederico Hamman T. Lavra Imler, MD   Patient's Medications  New Prescriptions   No  medications on file  Previous Medications   IBUPROFEN (ADVIL,MOTRIN) 200 MG TABLET    Take 200 mg by mouth as needed.   LISINOPRIL (PRINIVIL,ZESTRIL) 10 MG TABLET    Take 1 tablet (10 mg total) by mouth daily.   MULTIPLE VITAMINS-MINERALS (ICAPS) CAPS    Take 1 capsule by mouth daily.  Modified Medications   No medications on file  Discontinued Medications   TRAMADOL (ULTRAM) 50 MG TABLET    Take 0.5 tablets (25 mg total) by mouth every 8 (eight) hours as needed (Do not drive or operate machinery while taking as can cause drowsiness. Take 0.5-1 tablet as needed for pain.).

## 2016-02-24 ENCOUNTER — Ambulatory Visit (INDEPENDENT_AMBULATORY_CARE_PROVIDER_SITE_OTHER): Payer: PPO | Admitting: Podiatry

## 2016-02-24 ENCOUNTER — Encounter: Payer: Self-pay | Admitting: Podiatry

## 2016-02-24 DIAGNOSIS — M7752 Other enthesopathy of left foot: Secondary | ICD-10-CM | POA: Diagnosis not present

## 2016-02-24 DIAGNOSIS — Q828 Other specified congenital malformations of skin: Secondary | ICD-10-CM

## 2016-02-25 NOTE — Progress Notes (Signed)
She presents today for a chief complaint of painful lesions to the fourth digit right foot and fifth digit left foot.  Objective: Vital signs are stable she is alert and oriented 3 pulses are palpable. Adductovarus rotated fourth and fifth hammertoes and their juxtaposition to one another results in reactive hyperkeratosis porokeratotic lesion to the lateral aspect of the PIPJ fourth right and fifth left  Assessment: Hammertoe deformities with painful porokeratosis bilateral.  Plan: Debridement of all reactive hyperkeratosis will follow up with her on an as-needed basis demonstrated and supplied her with digital silicone Shields.

## 2016-04-13 DIAGNOSIS — H16223 Keratoconjunctivitis sicca, not specified as Sjogren's, bilateral: Secondary | ICD-10-CM | POA: Diagnosis not present

## 2016-04-13 DIAGNOSIS — H35363 Drusen (degenerative) of macula, bilateral: Secondary | ICD-10-CM | POA: Diagnosis not present

## 2016-04-13 DIAGNOSIS — H353131 Nonexudative age-related macular degeneration, bilateral, early dry stage: Secondary | ICD-10-CM | POA: Diagnosis not present

## 2016-04-13 DIAGNOSIS — H16213 Exposure keratoconjunctivitis, bilateral: Secondary | ICD-10-CM | POA: Diagnosis not present

## 2016-04-14 ENCOUNTER — Other Ambulatory Visit: Payer: Self-pay | Admitting: Family Medicine

## 2016-08-19 DIAGNOSIS — H35363 Drusen (degenerative) of macula, bilateral: Secondary | ICD-10-CM | POA: Diagnosis not present

## 2016-08-19 DIAGNOSIS — H353132 Nonexudative age-related macular degeneration, bilateral, intermediate dry stage: Secondary | ICD-10-CM | POA: Diagnosis not present

## 2016-10-14 ENCOUNTER — Other Ambulatory Visit: Payer: Self-pay | Admitting: Family Medicine

## 2016-12-21 DIAGNOSIS — H524 Presbyopia: Secondary | ICD-10-CM | POA: Diagnosis not present

## 2016-12-21 DIAGNOSIS — H52203 Unspecified astigmatism, bilateral: Secondary | ICD-10-CM | POA: Diagnosis not present

## 2016-12-21 DIAGNOSIS — Z961 Presence of intraocular lens: Secondary | ICD-10-CM | POA: Diagnosis not present

## 2016-12-21 DIAGNOSIS — H16213 Exposure keratoconjunctivitis, bilateral: Secondary | ICD-10-CM | POA: Diagnosis not present

## 2016-12-21 DIAGNOSIS — H35363 Drusen (degenerative) of macula, bilateral: Secondary | ICD-10-CM | POA: Diagnosis not present

## 2016-12-21 DIAGNOSIS — H16223 Keratoconjunctivitis sicca, not specified as Sjogren's, bilateral: Secondary | ICD-10-CM | POA: Diagnosis not present

## 2017-01-10 ENCOUNTER — Other Ambulatory Visit: Payer: Self-pay | Admitting: Family Medicine

## 2017-01-23 ENCOUNTER — Ambulatory Visit (INDEPENDENT_AMBULATORY_CARE_PROVIDER_SITE_OTHER): Payer: PPO

## 2017-01-23 VITALS — BP 140/80 | HR 67 | Temp 98.3°F | Ht 59.0 in | Wt 140.8 lb

## 2017-01-23 DIAGNOSIS — I10 Essential (primary) hypertension: Secondary | ICD-10-CM | POA: Diagnosis not present

## 2017-01-23 DIAGNOSIS — Z0000 Encounter for general adult medical examination without abnormal findings: Secondary | ICD-10-CM | POA: Diagnosis not present

## 2017-01-23 LAB — BASIC METABOLIC PANEL
BUN: 14 mg/dL (ref 6–23)
CO2: 29 mEq/L (ref 19–32)
Calcium: 9.5 mg/dL (ref 8.4–10.5)
Chloride: 108 mEq/L (ref 96–112)
Creatinine, Ser: 0.8 mg/dL (ref 0.40–1.20)
GFR: 71.57 mL/min (ref >60.00)
Glucose, Bld: 101 mg/dL — ABNORMAL HIGH (ref 70–99)
Potassium: 4.9 mEq/L (ref 3.5–5.1)
Sodium: 142 mEq/L (ref 135–145)

## 2017-01-23 LAB — CBC
HEMATOCRIT: 38.8 % (ref 36.0–46.0)
HEMOGLOBIN: 12.6 g/dL (ref 12.0–15.0)
MCHC: 32.5 g/dL (ref 30.0–36.0)
MCV: 78.4 fl (ref 78.0–100.0)
Platelets: 246 10*3/uL (ref 150.0–400.0)
RBC: 4.95 Mil/uL (ref 3.87–5.11)
RDW: 16 % — AB (ref 11.5–15.5)
WBC: 6.5 10*3/uL (ref 4.0–10.5)

## 2017-01-23 LAB — HEPATIC FUNCTION PANEL
ALBUMIN: 3.8 g/dL (ref 3.5–5.2)
ALT: 7 U/L (ref 0–35)
AST: 13 U/L (ref 0–37)
Alkaline Phosphatase: 127 U/L — ABNORMAL HIGH (ref 39–117)
BILIRUBIN TOTAL: 0.5 mg/dL (ref 0.2–1.2)
Bilirubin, Direct: 0.1 mg/dL (ref 0.0–0.3)
Total Protein: 6.7 g/dL (ref 6.0–8.3)

## 2017-01-23 LAB — LIPID PANEL
Cholesterol: 163 mg/dL (ref 0–200)
HDL: 62.3 mg/dL (ref >39.00)
LDL Cholesterol: 90 mg/dL (ref 0–99)
NonHDL: 101
Total CHOL/HDL Ratio: 3
Triglycerides: 56 mg/dL (ref 0.0–149.0)
VLDL: 11.2 mg/dL (ref 0.0–40.0)

## 2017-01-23 LAB — TSH: TSH: 1.81 u[IU]/mL (ref 0.35–4.50)

## 2017-01-23 NOTE — Progress Notes (Signed)
PCP notes:   Health maintenance:  Tetanus - postponed/insurance  Abnormal screenings:   Hearing - failed Fall risk - hx of fall with injury; no medical treatment  Patient concerns:   None  Nurse concerns:  None  Next PCP appt:   01/30/17 @ 1445

## 2017-01-23 NOTE — Patient Instructions (Signed)
Paula Luna , Thank you for taking time to come for your Medicare Wellness Visit. I appreciate your ongoing commitment to your health goals. Please review the following plan we discussed and let me know if I can assist you in the future.   These are the goals we discussed: Goals    . Increase physical activity          Starting 01/23/2017, I will continue to walk for at least 5-7 hours 3 days per week.        This is a list of the screening recommended for you and due dates:  Health Maintenance  Topic Date Due  . Tetanus Vaccine  01/24/2027*  . Flu Shot  04/12/2017  . DEXA scan (bone density measurement)  Completed  . Pneumonia vaccines  Completed  *Topic was postponed. The date shown is not the original due date.   Preventive Care for Adults  A healthy lifestyle and preventive care can promote health and wellness. Preventive health guidelines for adults include the following key practices.  . A routine yearly physical is a good way to check with your health care provider about your health and preventive screening. It is a chance to share any concerns and updates on your health and to receive a thorough exam.  . Visit your dentist for a routine exam and preventive care every 6 months. Brush your teeth twice a day and floss once a day. Good oral hygiene prevents tooth decay and gum disease.  . The frequency of eye exams is based on your age, health, family medical history, use  of contact lenses, and other factors. Follow your health care provider's ecommendations for frequency of eye exams.  . Eat a healthy diet. Foods like vegetables, fruits, whole grains, low-fat dairy products, and lean protein foods contain the nutrients you need without too many calories. Decrease your intake of foods high in solid fats, added sugars, and salt. Eat the right amount of calories for you. Get information about a proper diet from your health care provider, if necessary.  . Regular physical exercise  is one of the most important things you can do for your health. Most adults should get at least 150 minutes of moderate-intensity exercise (any activity that increases your heart rate and causes you to sweat) each week. In addition, most adults need muscle-strengthening exercises on 2 or more days a week.  Silver Sneakers may be a benefit available to you. To determine eligibility, you may visit the website: www.silversneakers.com or contact program at [phone] Mon-Fri between 8AM-8PM.   . Maintain a healthy weight. The body mass index (BMI) is a screening tool to identify possible weight problems. It provides an estimate of body fat based on height and weight. Your health care provider can find your BMI and can help you achieve or maintain a healthy weight.   For adults 20 years and older: ? A BMI below 18.5 is considered underweight. ? A BMI of 18.5 to 24.9 is normal. ? A BMI of 25 to 29.9 is considered overweight. ? A BMI of 30 and above is considered obese.   . Maintain normal blood lipids and cholesterol levels by exercising and minimizing your intake of saturated fat. Eat a balanced diet with plenty of fruit and vegetables. Blood tests for lipids and cholesterol should begin at age 81 and be repeated every 5 years. If your lipid or cholesterol levels are high, you are over 50, or you are at high risk for heart  disease, you may need your cholesterol levels checked more frequently. Ongoing high lipid and cholesterol levels should be treated with medicines if diet and exercise are not working.  . If you smoke, find out from your health care provider how to quit. If you do not use tobacco, please do not start.  . If you choose to drink alcohol, please do not consume more than 2 drinks per day. One drink is considered to be 12 ounces (355 mL) of beer, 5 ounces (148 mL) of wine, or 1.5 ounces (44 mL) of liquor.  . If you are 81-42 years old, ask your health care provider if you should take  aspirin to prevent strokes.  . Use sunscreen. Apply sunscreen liberally and repeatedly throughout the day. You should seek shade when your shadow is shorter than you. Protect yourself by wearing long sleeves, pants, a wide-brimmed hat, and sunglasses year round, whenever you are outdoors.  . Once a month, do a whole body skin exam, using a mirror to look at the skin on your back. Tell your health care provider of new moles, moles that have irregular borders, moles that are larger than a pencil eraser, or moles that have changed in shape or color.

## 2017-01-23 NOTE — Progress Notes (Signed)
Subjective:   Paula Luna is a 83 y.o. female who presents for Medicare Annual (Subsequent) preventive examination.  Review of Systems: N/A Cardiac Risk Factors include: advanced age (>18men, >62 women);hypertension     Objective:     Vitals: BP 140/80 (BP Location: Right Arm, Patient Position: Sitting, Cuff Size: Normal)   Pulse 67   Temp 98.3 F (36.8 C) (Oral)   Ht 4\' 11"  (1.499 m) Comment: no shoes  Wt 140 lb 12 oz (63.8 kg)   SpO2 97%   BMI 28.43 kg/m   Body mass index is 28.43 kg/m.   Tobacco History  Smoking Status  . Former Smoker  Smokeless Tobacco  . Never Used     Counseling given: No   Past Medical History:  Diagnosis Date  . Hypertension 12/25/2013  . Macular degeneration of left eye 01/06/2015  . Osteoporosis    Past Surgical History:  Procedure Laterality Date  . TOTAL ABDOMINAL HYSTERECTOMY     History reviewed. No pertinent family history. History  Sexual Activity  . Sexual activity: No    Outpatient Encounter Prescriptions as of 01/23/2017  Medication Sig  . docusate sodium (COLACE) 100 MG capsule Take 100 mg by mouth daily as needed for mild constipation.  Marland Kitchen ibuprofen (ADVIL,MOTRIN) 200 MG tablet Take 200 mg by mouth as needed.  Marland Kitchen lisinopril (PRINIVIL,ZESTRIL) 10 MG tablet TAKE ONE TABLET BY MOUTH ONCE DAILY  . Multiple Vitamins-Minerals (ICAPS) CAPS Take 1 capsule by mouth daily.   No facility-administered encounter medications on file as of 01/23/2017.     Activities of Daily Living In your present state of health, do you have any difficulty performing the following activities: 01/23/2017  Hearing? N  Vision? Y  Difficulty concentrating or making decisions? N  Walking or climbing stairs? Y  Dressing or bathing? N  Doing errands, shopping? Y  Preparing Food and eating ? N  Using the Toilet? N  In the past six months, have you accidently leaked urine? Y  Do you have problems with loss of bowel control? N  Managing your  Medications? N  Managing your Finances? N  Housekeeping or managing your Housekeeping? N  Some recent data might be hidden    Patient Care Team: Hannah Beat, MD as PCP - General    Assessment:     Hearing Screening   125Hz  250Hz  500Hz  1000Hz  2000Hz  3000Hz  4000Hz  6000Hz  8000Hz   Right ear:   40 40 40  0    Left ear:   40 0 40  0    Vision Screening Comments: Last vision exam in 2018 @ [facility]   Exercise Activities and Dietary recommendations Current Exercise Habits: Home exercise routine, Type of exercise: walking, Time (Minutes): > 60, Frequency (Times/Week): 3, Weekly Exercise (Minutes/Week): 0, Intensity: Mild, Exercise limited by: None identified  Goals    . Increase physical activity          Starting 01/23/2017, I will continue to walk for at least 5-7 hours 3 days per week.       Fall Risk Fall Risk  01/23/2017 01/20/2016 01/05/2015 12/25/2013 10/15/2012  Falls in the past year? Yes Yes No No Yes  Number falls in past yr: 1 1 - - 1  Injury with Fall? Yes Yes - - Yes  Risk for fall due to : - - - - (No Data)  Risk for fall due to (comments): - - - - patient slipped over toe   Depression Screen PHQ  2/9 Scores 01/23/2017 01/20/2016 01/05/2015 12/25/2013  PHQ - 2 Score 0 0 0 0     Cognitive Function MMSE - Mini Mental State Exam 01/23/2017  Orientation to time 5  Orientation to Place 5  Registration 3  Attention/ Calculation 0  Recall 3  Language- name 2 objects 0  Language- repeat 1  Language- follow 3 step command 3  Language- read & follow direction 0  Write a sentence 0  Copy design 0  Total score 20     PLEASE NOTE: A Mini-Cog screen was completed. Maximum score is 20. A value of 0 denotes this part of Folstein MMSE was not completed or the patient failed this part of the Mini-Cog screening.   Mini-Cog Screening Orientation to Time - Max 5 pts Orientation to Place - Max 5 pts Registration - Max 3 pts Recall - Max 3 pts Language Repeat - Max  1 pts Language Follow 3 Step Command - Max 3 pts     Immunization History  Administered Date(s) Administered  . Influenza Split 07/12/2012, 06/28/2013  . Influenza Whole 08/20/2007, 05/10/2010  . Influenza, High Dose Seasonal PF 08/14/2014, 07/29/2015, 05/22/2016  . Pneumococcal Conjugate-13 12/25/2013  . Pneumococcal Polysaccharide-23 05/10/2010  . Zoster 10/15/2012   Screening Tests Health Maintenance  Topic Date Due  . TETANUS/TDAP  01/24/2027 (Originally 10/07/1943)  . INFLUENZA VACCINE  04/12/2017  . DEXA SCAN  Completed  . PNA vac Low Risk Adult  Completed      Plan:     I have personally reviewed and addressed the Medicare Annual Wellness questionnaire and have noted the following in the patient's chart:  A. Medical and social history B. Use of alcohol, tobacco or illicit drugs  C. Current medications and supplements D. Functional ability and status E.  Nutritional status F.  Physical activity G. Advance directives H. List of other physicians I.  Hospitalizations, surgeries, and ER visits in previous 12 months J.  Vitals K. Screenings to include hearing, vision, cognitive, depression L. Referrals and appointments - none  In addition, I have reviewed and discussed with patient certain preventive protocols, quality metrics, and best practice recommendations. A written personalized care plan for preventive services as well as general preventive health recommendations were provided to patient.  See attached scanned questionnaire for additional information.   Signed,   Randa EvensLesia Tijuana Scheidegger, MHA, BS, LPN Health Coach

## 2017-01-23 NOTE — Progress Notes (Signed)
Pre visit review using our clinic review tool, if applicable. No additional management support is needed unless otherwise documented below in the visit note. 

## 2017-01-24 ENCOUNTER — Telehealth: Payer: Self-pay | Admitting: Family Medicine

## 2017-01-24 NOTE — Telephone Encounter (Signed)
-----   Message from Alvina Chouerri J Walsh sent at 01/16/2017  3:21 PM EDT ----- Regarding: Lab orders for Wednesday, 5.16.18 Patient is scheduled for CPX labs, please order future labs, Thanks , Camelia Engerri

## 2017-01-24 NOTE — Progress Notes (Signed)
I reviewed health advisor's note, was available for consultation, and agree with documentation and plan.  

## 2017-01-25 ENCOUNTER — Other Ambulatory Visit: Payer: PPO

## 2017-01-30 ENCOUNTER — Encounter: Payer: PPO | Admitting: Family Medicine

## 2017-02-15 ENCOUNTER — Ambulatory Visit (INDEPENDENT_AMBULATORY_CARE_PROVIDER_SITE_OTHER): Payer: PPO | Admitting: Family Medicine

## 2017-02-15 ENCOUNTER — Encounter: Payer: PPO | Admitting: Family Medicine

## 2017-02-15 ENCOUNTER — Encounter: Payer: Self-pay | Admitting: Family Medicine

## 2017-02-15 VITALS — BP 136/80 | HR 84 | Temp 98.3°F | Ht 59.0 in | Wt 139.5 lb

## 2017-02-15 DIAGNOSIS — R531 Weakness: Secondary | ICD-10-CM

## 2017-02-15 DIAGNOSIS — Z0001 Encounter for general adult medical examination with abnormal findings: Secondary | ICD-10-CM | POA: Diagnosis not present

## 2017-02-15 DIAGNOSIS — Z0000 Encounter for general adult medical examination without abnormal findings: Secondary | ICD-10-CM

## 2017-02-15 DIAGNOSIS — Z9181 History of falling: Secondary | ICD-10-CM

## 2017-02-15 DIAGNOSIS — K55029 Acute infarction of small intestine, extent unspecified: Secondary | ICD-10-CM

## 2017-02-15 DIAGNOSIS — R2681 Unsteadiness on feet: Secondary | ICD-10-CM

## 2017-02-15 DIAGNOSIS — K5900 Constipation, unspecified: Secondary | ICD-10-CM | POA: Diagnosis not present

## 2017-02-15 NOTE — Progress Notes (Addendum)
Dr. Frederico Hamman T. Everley Evora, MD, [facility] [place], [address] Phone: [phone] Fax: [phone]  02/15/2017  Patient: Paula Luna, MRN: [id number], DOB: February 13, 1925, 83 y.o.  Primary Physician:  Owens Loffler, MD   Chief Complaint  Patient presents with  . Annual Exam    Part 2   Subjective:   Paula Luna is a 83 y.o. pleasant patient who presents with the following:  Health Maintenance Summary Reviewed and updated, unless pt declines services.  Still working at [employer] 3 days a week.  Tobacco History Reviewed. Non-smoker Alcohol: No concerns, no excessive use Exercise Habits: Some activity, rec at least 30 mins 5 times a week STD concerns: none Drug Use: None Birth control method: hyst Lumps or breast concerns: no Breast Cancer Family History: no  Health Maintenance  Topic Date Due  . TETANUS/TDAP  01/24/2027 (Originally 10/07/1943)  . INFLUENZA VACCINE  04/12/2017  . DEXA SCAN  Completed  . PNA vac Low Risk Adult  Completed    Immunization History  Administered Date(s) Administered  . Influenza Split 07/12/2012, 06/28/2013  . Influenza Whole 08/20/2007, 05/10/2010  . Influenza, High Dose Seasonal PF 08/14/2014, 07/29/2015, 05/22/2016  . Pneumococcal Conjugate-13 12/25/2013  . Pneumococcal Polysaccharide-23 05/10/2010  . Zoster 10/15/2012   Patient Active Problem List   Diagnosis Date Noted  . Macular degeneration of left eye 01/06/2015  . Obesity (BMI 30-39.9) 12/25/2013  . Essential hypertension, benign 12/25/2013  . BACK PAIN 08/02/2010   Past Medical History:  Diagnosis Date  . Hypertension 12/25/2013  . Macular degeneration of left eye 01/06/2015  . Osteoporosis    Past Surgical History:  Procedure Laterality Date  . TOTAL ABDOMINAL HYSTERECTOMY     Social History   Social History  . Marital status: Married    Spouse name: N/A  . Number of children: N/A  . Years of  education: N/A   Occupational History  . Not on file.   Social History Main Topics  . Smoking status: Former Research scientist (life sciences)  . Smokeless tobacco: Never Used  . Alcohol use No  . Drug use: No  . Sexual activity: No   Other Topics Concern  . Not on file   Social History Narrative   Widowed after 66 years   Working some at [employer] in [employer]         No family history on file. Allergies  Allergen Reactions  . Latex Rash  . Tetanus Toxoid Anaphylaxis    REACTION: anaphylaxis  . Alendronate Sodium     REACTION: passed out    Medication list has been reviewed and updated.   General: Denies fever, chills, sweats. No significant weight loss. Eyes: Denies blurring,significant itching ENT: Denies earache, sore throat, and hoarseness.  Cardiovascular: Denies chest pains, palpitations, dyspnea on exertion,  Respiratory: Denies cough, dyspnea at rest,wheeezing Breast: no concerns about lumps GI: constipation GU: Denies dysuria, hematuria, urinary hesitancy, nocturia, denies STD risk, no concerns about discharge  + neck pain Musculoskeletal: Denies back pain, joint pain Derm: Denies rash, itching Neuro: Denies  paresthesias, frequent falls, frequent headaches Psych: Denies depression, anxiety Endocrine: Denies cold intolerance, heat intolerance, polydipsia Heme: Denies enlarged lymph nodes Allergy: No hayfever  Objective:   BP 136/80 (BP Location: Right Arm, Patient Position: Sitting)   Pulse 84   Temp 98.3 F (36.8 C) (Oral)   Ht _0  (1.499 m)   Wt 139 lb 8 oz (  63.3 kg)   BMI 28.18 kg/m  No exam data present  GEN: well developed, well nourished, no acute distress Eyes: conjunctiva and lids normal, PERRLA, EOMI ENT: TM clear, nares clear, oral exam WNL Neck: supple, no lymphadenopathy, no thyromegaly, no JVD Pulm: clear to auscultation and percussion, respiratory effort normal CV: regular rate and rhythm, S1-S2, no murmur, rub or gallop, no  bruits Chest: no scars, masses, no lumps BREAST: breast exam declined GI: soft, non-tender; no hepatosplenomegaly, masses; active bowel sounds all quadrants GU: GU exam declined Lymph: no cervical, axillary or inguinal adenopathy MSK: gait normal, muscle tone and strength WNL, no joint swelling, effusions, discoloration, crepitus  SKIN: clear, good turgor, color WNL, no rashes, lesions, or ulcerations Neuro: normal mental status, normal strength, sensation, and motion Psych: alert; oriented to person, place and time, normally interactive and not anxious or depressed in appearance.   All labs reviewed with patient. Lipids:    Component Value Date/Time   CHOL 163 01/23/2017 0952   TRIG 56.0 01/23/2017 0952   HDL 62.30 01/23/2017 0952   LDLDIRECT 101.2 05/20/2009 0000   VLDL 11.2 01/23/2017 0952   CHOLHDL 3 01/23/2017 0952   CBC: CBC Latest Ref Rng & Units 01/23/2017 12/30/2015 12/26/2014  WBC 4.0 - 10.5 K/uL 6.5 6.1 5.9  Hemoglobin 12.0 - 15.0 g/dL 12.6 12.8 12.5  Hematocrit 36.0 - 46.0 % 38.8 39.6 37.8  Platelets 150.0 - 400.0 K/uL 246.0 217.0 599.3    Basic Metabolic Panel:    Component Value Date/Time   NA 142 01/23/2017 0952   K 4.9 01/23/2017 0952   CL 108 01/23/2017 0952   CO2 29 01/23/2017 0952   BUN 14 01/23/2017 0952   CREATININE 0.80 01/23/2017 0952   GLUCOSE 101 (H) 01/23/2017 0952   CALCIUM 9.5 01/23/2017 0952   Hepatic Function Latest Ref Rng & Units 01/23/2017 12/30/2015 12/26/2014  Total Protein 6.0 - 8.3 g/dL 6.7 6.9 6.3  Albumin 3.5 - 5.2 g/dL 3.8 4.0 3.7  AST 0 - 37 U/L _0 ALT 0 - 35 U/L _1 Alk Phosphatase 39 - 117 U/L 127(H) 70 74  Total Bilirubin 0.2 - 1.2 mg/dL 0.5 0.5 0.6  Bilirubin, Direct 0.0 - 0.3 mg/dL 0.1 0.1 -    Lab Results  Component Value Date   TSH 1.81 01/23/2017   No results found.  Assessment and Plan:   Encounter for general adult medical examination with abnormal findings   Doing well overall  Addendum:  03/24/2017 The patient was admitted to the hospital March 18, 2017, and she was discharged on March 23, 2017.  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  There was also a 600 cc hemorrhagic ascites. Bowel resection was also undertaken of the small bowel.  She was admitted to the ICU and discharged on March 23, 2017.  She is still having difficulty with transfers and walking, and additional basic care.  Home Health documentation: Face to face encounter documentation follows: Patient needs home health services for the following reasons:  Home health services needed: 1. HHRN assessment: RN to assist with home health evaluation and need for other additional services, particularly in the immediate postoperative period 2. HHPT: assist with transfers, walking, stability, decrease of fall risk in a patient who is 83 years old, recently discharged from the ICU, and recently had a ischemic bowel and  bowel resection.  2-3 times per week for 4-6 weeks.  Evaluate and treat, modalities of choice Patient homebound for the following reasons who: very recent emergent surgery, admission to the ICU, ischemic bowel, bowel resection, difficulty walking, difficulty with transfers, and difficulty with basic tasks at home.  Unable to walk except for basic minimal amounts with walker currently.  Difficulty with activities of daily living.  Health Maintenance Exam: The patient's preventative maintenance and recommended screening tests for an annual wellness exam were reviewed in full today. Brought up to date unless services declined.  Counselled on the importance of diet, exercise, and its role in overall health and mortality. The patient's FH and SH was reviewed, including their home life, tobacco status, and drug and alcohol status.  Follow-up in 1 year for physical exam or additional follow-up  below.  Patient Instructions  CONSTIPATION Difficult, uncomfortable, infrequent BM  1. Warm prune juice, hot water, tea, coffee, apple juice 2. Prevention: drink 8 glasses water daily, FIBER (raw fruit, veggies, bran cereal, whole grains), regular exercise   3. Bulk formers like Metamucil (psyllium) OR Citrucel (methylcellulose) usually help. EVERY DAY  4. Stool softeners (Docusate) occaisionally OK 5. Occaisional over the counter Miralax usually safe    6. Overuse of stimulant  laxatives (Ex-Lax, Mag Citrate, Dulcolax, etc.) can be habit forming      Signed,  Ronav Furney T. Wing Schoch, MD   Allergies as of 02/15/2017      Reactions   Latex Rash   Tetanus Toxoid Anaphylaxis   REACTION: anaphylaxis   Alendronate Sodium    REACTION: passed out      Medication List       Accurate as of 02/15/17  9:46 AM. Always use your most recent med list.          docusate sodium 100 MG capsule Commonly known as:  COLACE Take 100 mg by mouth daily as needed for mild constipation.   ibuprofen 200 MG tablet Commonly known as:  ADVIL,MOTRIN Take 200 mg by mouth as needed.   ICAPS Caps Take 1 capsule by mouth daily.   lisinopril 10 MG tablet Commonly known as:  PRINIVIL,ZESTRIL TAKE ONE TABLET BY MOUTH ONCE DAILY

## 2017-02-15 NOTE — Patient Instructions (Signed)
CONSTIPATION Difficult, uncomfortable, infrequent BM  1. Warm prune juice, hot water, tea, coffee, apple juice 2. Prevention: drink 8 glasses water daily, FIBER (raw fruit, veggies, bran cereal, whole grains), regular exercise   3. Bulk formers like Metamucil (psyllium) OR Citrucel (methylcellulose) usually help. EVERY DAY  4. Stool softeners (Docusate) occaisionally OK 5. Occaisional over the counter Miralax usually safe    6. Overuse of stimulant  laxatives (Ex-Lax, Mag Citrate, Dulcolax, etc.) can be habit forming

## 2017-03-18 ENCOUNTER — Inpatient Hospital Stay
Admission: EM | Admit: 2017-03-18 | Discharge: 2017-03-23 | DRG: 330 | Disposition: A | Discharge status: Home / Self Care | Payer: PPO | Attending: Surgery | Admitting: Surgery

## 2017-03-18 ENCOUNTER — Emergency Department: Payer: PPO | Admitting: Anesthesiology

## 2017-03-18 ENCOUNTER — Encounter
Admission: EM | Disposition: A | Discharge status: Home / Self Care | Payer: Self-pay | Source: Home / Self Care | Attending: Surgery

## 2017-03-18 ENCOUNTER — Encounter: Payer: Self-pay | Admitting: Emergency Medicine

## 2017-03-18 ENCOUNTER — Inpatient Hospital Stay: Payer: PPO

## 2017-03-18 ENCOUNTER — Emergency Department: Payer: PPO

## 2017-03-18 DIAGNOSIS — I4891 Unspecified atrial fibrillation: Secondary | ICD-10-CM | POA: Diagnosis not present

## 2017-03-18 DIAGNOSIS — Z79899 Other long term (current) drug therapy: Secondary | ICD-10-CM

## 2017-03-18 DIAGNOSIS — E162 Hypoglycemia, unspecified: Secondary | ICD-10-CM | POA: Diagnosis not present

## 2017-03-18 DIAGNOSIS — E876 Hypokalemia: Secondary | ICD-10-CM | POA: Diagnosis not present

## 2017-03-18 DIAGNOSIS — R109 Unspecified abdominal pain: Secondary | ICD-10-CM | POA: Diagnosis not present

## 2017-03-18 DIAGNOSIS — J9600 Acute respiratory failure, unspecified whether with hypoxia or hypercapnia: Secondary | ICD-10-CM | POA: Diagnosis not present

## 2017-03-18 DIAGNOSIS — K9189 Other postprocedural complications and disorders of digestive system: Secondary | ICD-10-CM | POA: Diagnosis not present

## 2017-03-18 DIAGNOSIS — Z452 Encounter for adjustment and management of vascular access device: Secondary | ICD-10-CM | POA: Diagnosis not present

## 2017-03-18 DIAGNOSIS — K567 Ileus, unspecified: Secondary | ICD-10-CM | POA: Diagnosis not present

## 2017-03-18 DIAGNOSIS — Z887 Allergy status to serum and vaccine status: Secondary | ICD-10-CM

## 2017-03-18 DIAGNOSIS — Z87891 Personal history of nicotine dependence: Secondary | ICD-10-CM

## 2017-03-18 DIAGNOSIS — M810 Age-related osteoporosis without current pathological fracture: Secondary | ICD-10-CM | POA: Diagnosis not present

## 2017-03-18 DIAGNOSIS — I9581 Postprocedural hypotension: Secondary | ICD-10-CM | POA: Diagnosis not present

## 2017-03-18 DIAGNOSIS — R188 Other ascites: Secondary | ICD-10-CM | POA: Diagnosis not present

## 2017-03-18 DIAGNOSIS — K55069 Acute infarction of intestine, part and extent unspecified: Secondary | ICD-10-CM | POA: Diagnosis not present

## 2017-03-18 DIAGNOSIS — H3530 Unspecified macular degeneration: Secondary | ICD-10-CM | POA: Diagnosis present

## 2017-03-18 DIAGNOSIS — R739 Hyperglycemia, unspecified: Secondary | ICD-10-CM | POA: Diagnosis not present

## 2017-03-18 DIAGNOSIS — K559 Vascular disorder of intestine, unspecified: Secondary | ICD-10-CM | POA: Diagnosis present

## 2017-03-18 DIAGNOSIS — K55021 Focal (segmental) acute infarction of small intestine: Secondary | ICD-10-CM | POA: Diagnosis not present

## 2017-03-18 DIAGNOSIS — Z91040 Latex allergy status: Secondary | ICD-10-CM | POA: Diagnosis not present

## 2017-03-18 DIAGNOSIS — Z888 Allergy status to other drugs, medicaments and biological substances status: Secondary | ICD-10-CM

## 2017-03-18 DIAGNOSIS — K565 Intestinal adhesions [bands], unspecified as to partial versus complete obstruction: Secondary | ICD-10-CM | POA: Diagnosis not present

## 2017-03-18 DIAGNOSIS — K55029 Acute infarction of small intestine, extent unspecified: Secondary | ICD-10-CM | POA: Diagnosis not present

## 2017-03-18 DIAGNOSIS — E872 Acidosis: Secondary | ICD-10-CM | POA: Diagnosis not present

## 2017-03-18 DIAGNOSIS — E108 Type 1 diabetes mellitus with unspecified complications: Secondary | ICD-10-CM | POA: Diagnosis not present

## 2017-03-18 DIAGNOSIS — I10 Essential (primary) hypertension: Secondary | ICD-10-CM | POA: Diagnosis present

## 2017-03-18 DIAGNOSIS — R7309 Other abnormal glucose: Secondary | ICD-10-CM | POA: Diagnosis not present

## 2017-03-18 DIAGNOSIS — J90 Pleural effusion, not elsewhere classified: Secondary | ICD-10-CM | POA: Diagnosis not present

## 2017-03-18 DIAGNOSIS — R000 Tachycardia, unspecified: Secondary | ICD-10-CM | POA: Diagnosis not present

## 2017-03-18 DIAGNOSIS — J9690 Respiratory failure, unspecified, unspecified whether with hypoxia or hypercapnia: Secondary | ICD-10-CM

## 2017-03-18 DIAGNOSIS — K5900 Constipation, unspecified: Secondary | ICD-10-CM | POA: Diagnosis not present

## 2017-03-18 DIAGNOSIS — R112 Nausea with vomiting, unspecified: Secondary | ICD-10-CM | POA: Diagnosis not present

## 2017-03-18 DIAGNOSIS — Z9911 Dependence on respirator [ventilator] status: Secondary | ICD-10-CM

## 2017-03-18 DIAGNOSIS — K56609 Unspecified intestinal obstruction, unspecified as to partial versus complete obstruction: Secondary | ICD-10-CM | POA: Diagnosis not present

## 2017-03-18 HISTORY — PX: LAPAROTOMY: SHX154

## 2017-03-18 LAB — BLOOD GAS, ARTERIAL
ACID-BASE DEFICIT: 4.7 mmol/L — AB (ref 0.0–2.0)
Allens test (pass/fail): POSITIVE — AB
Bicarbonate: 20.4 mmol/L (ref 20.0–28.0)
FIO2: 0.5
LHR: 15 {breaths}/min
O2 SAT: 99.2 %
PCO2 ART: 37 mmHg (ref 32.0–48.0)
PEEP: 5 cmH2O
PH ART: 7.35 (ref 7.350–7.450)
Patient temperature: 37
VT: 450 mL
pO2, Arterial: 150 mmHg — ABNORMAL HIGH (ref 83.0–108.0)

## 2017-03-18 LAB — URINALYSIS, COMPLETE (UACMP) WITH MICROSCOPIC
BACTERIA UA: NONE SEEN
BILIRUBIN URINE: NEGATIVE
Glucose, UA: >=500 mg/dL — AB
Ketones, ur: 20 mg/dL — AB
Leukocytes, UA: NEGATIVE
Nitrite: NEGATIVE
PROTEIN: 100 mg/dL — AB
Specific Gravity, Urine: 1.011 (ref 1.005–1.030)
pH: 6 (ref 5.0–8.0)

## 2017-03-18 LAB — TYPE AND SCREEN
ABO/RH(D): O POS
Antibody Screen: NEGATIVE

## 2017-03-18 LAB — COMPREHENSIVE METABOLIC PANEL
ALBUMIN: 2.9 g/dL — AB (ref 3.5–5.0)
ALK PHOS: 97 U/L (ref 38–126)
ALT: 10 U/L — AB (ref 14–54)
ALT: 15 U/L (ref 14–54)
AST: 31 U/L (ref 15–41)
AST: 38 U/L (ref 15–41)
Albumin: 4.2 g/dL (ref 3.5–5.0)
Alkaline Phosphatase: 61 U/L (ref 38–126)
Anion gap: 14 (ref 5–15)
Anion gap: 8 (ref 5–15)
BILIRUBIN TOTAL: 0.7 mg/dL (ref 0.3–1.2)
BILIRUBIN TOTAL: 1 mg/dL (ref 0.3–1.2)
BUN: 14 mg/dL (ref 6–20)
BUN: 13 mg/dL (ref 6–20)
CO2: 23 mmol/L (ref 22–32)
CO2: 21 mmol/L — ABNORMAL LOW (ref 22–32)
CREATININE: 0.81 mg/dL (ref 0.44–1.00)
Calcium: 9.1 mg/dL (ref 8.9–10.3)
Calcium: 7.8 mg/dL — ABNORMAL LOW (ref 8.9–10.3)
Chloride: 100 mmol/L — ABNORMAL LOW (ref 101–111)
Chloride: 107 mmol/L (ref 101–111)
Creatinine, Ser: 0.72 mg/dL (ref 0.44–1.00)
GFR calc Af Amer: >60 mL/min (ref >60)
GFR calc Af Amer: >60 mL/min (ref >60)
GFR calc non Af Amer: >60 mL/min (ref >60)
GLUCOSE: 165 mg/dL — AB (ref 65–99)
Glucose, Bld: 316 mg/dL — ABNORMAL HIGH (ref 65–99)
POTASSIUM: 4 mmol/L (ref 3.5–5.1)
Potassium: 3.4 mmol/L — ABNORMAL LOW (ref 3.5–5.1)
Sodium: 137 mmol/L (ref 135–145)
Sodium: 136 mmol/L (ref 135–145)
TOTAL PROTEIN: 7.5 g/dL (ref 6.5–8.1)
TOTAL PROTEIN: 4.9 g/dL — AB (ref 6.5–8.1)

## 2017-03-18 LAB — CBC WITH DIFFERENTIAL/PLATELET
Basophils Absolute: 0 10*3/uL (ref 0–0.1)
Basophils Relative: 0 %
Eosinophils Absolute: 0 10*3/uL (ref 0–0.7)
Eosinophils Relative: 0 %
HEMATOCRIT: 43.8 % (ref 35.0–47.0)
Hemoglobin: 14.1 g/dL (ref 12.0–16.0)
Lymphocytes Relative: 4 %
Lymphs Abs: 0.6 10*3/uL — ABNORMAL LOW (ref 1.0–3.6)
MCH: 25.5 pg — ABNORMAL LOW (ref 26.0–34.0)
MCHC: 32.2 g/dL (ref 32.0–36.0)
MCV: 79.2 fL — AB (ref 80.0–100.0)
MONO ABS: 0.6 10*3/uL (ref 0.2–0.9)
Monocytes Relative: 4 %
NEUTROS ABS: 13.2 10*3/uL — AB (ref 1.4–6.5)
Neutrophils Relative %: 92 %
Platelets: 255 10*3/uL (ref 150–440)
RBC: 5.53 MIL/uL — AB (ref 3.80–5.20)
RDW: 15.9 % — ABNORMAL HIGH (ref 11.5–14.5)
WBC: 14.4 10*3/uL — AB (ref 3.6–11.0)

## 2017-03-18 LAB — CBC
HEMATOCRIT: 35.7 % (ref 35.0–47.0)
HEMOGLOBIN: 12.1 g/dL (ref 12.0–16.0)
MCH: 26 pg (ref 26.0–34.0)
MCHC: 33.9 g/dL (ref 32.0–36.0)
MCV: 76.7 fL — ABNORMAL LOW (ref 80.0–100.0)
Platelets: 188 10*3/uL (ref 150–440)
RBC: 4.66 MIL/uL (ref 3.80–5.20)
RDW: 15.9 % — AB (ref 11.5–14.5)
WBC: 3.1 10*3/uL — AB (ref 3.6–11.0)

## 2017-03-18 LAB — GLUCOSE, CAPILLARY: Glucose-Capillary: 146 mg/dL — ABNORMAL HIGH (ref 65–99)

## 2017-03-18 LAB — TROPONIN I: Troponin I: <0.03 ng/mL (ref <0.03)

## 2017-03-18 LAB — TRIGLYCERIDES: Triglycerides: 41 mg/dL (ref <150)

## 2017-03-18 LAB — LACTIC ACID, PLASMA
Lactic Acid, Venous: 5 mmol/L (ref 0.5–1.9)
Lactic Acid, Venous: 4.8 mmol/L (ref 0.5–1.9)

## 2017-03-18 LAB — TSH: TSH: 2.359 u[IU]/mL (ref 0.350–4.500)

## 2017-03-18 LAB — MRSA PCR SCREENING: MRSA BY PCR: NEGATIVE

## 2017-03-18 LAB — LIPASE, BLOOD: Lipase: 22 U/L (ref 11–51)

## 2017-03-18 LAB — MAGNESIUM
MAGNESIUM: 1.9 mg/dL (ref 1.7–2.4)
Magnesium: 1.4 mg/dL — ABNORMAL LOW (ref 1.7–2.4)

## 2017-03-18 LAB — PHOSPHORUS: Phosphorus: 3.3 mg/dL (ref 2.5–4.6)

## 2017-03-18 SURGERY — LAPAROTOMY, EXPLORATORY
Anesthesia: General

## 2017-03-18 MED ORDER — SUCCINYLCHOLINE CHLORIDE 20 MG/ML IJ SOLN
INTRAMUSCULAR | Status: DC | PRN
  Administered 2017-03-18: 65 mg via INTRAVENOUS

## 2017-03-18 MED ORDER — MORPHINE SULFATE (PF) 2 MG/ML IV SOLN
2.0000 mg | Freq: Once | INTRAVENOUS | Status: AC
  Administered 2017-03-18: 2 mg via INTRAVENOUS
  Filled 2017-03-18: qty 1

## 2017-03-18 MED ORDER — FENTANYL CITRATE (PF) 100 MCG/2ML IJ SOLN
25.0000 ug | INTRAMUSCULAR | Status: DC | PRN
  Administered 2017-03-20: 25 ug via INTRAVENOUS
  Filled 2017-03-18: qty 2

## 2017-03-18 MED ORDER — PROPOFOL 10 MG/ML IV BOLUS
INTRAVENOUS | Status: AC
  Filled 2017-03-18: qty 20

## 2017-03-18 MED ORDER — FENTANYL CITRATE (PF) 100 MCG/2ML IJ SOLN: 25.0000 ug | INTRAMUSCULAR | Status: DC | PRN

## 2017-03-18 MED ORDER — BUPIVACAINE LIPOSOME 1.3 % IJ SUSP
INTRAMUSCULAR | Status: AC
  Filled 2017-03-18: qty 20

## 2017-03-18 MED ORDER — LACTATED RINGERS IV SOLN
INTRAVENOUS | Status: DC | PRN
  Administered 2017-03-18 (×3): via INTRAVENOUS

## 2017-03-18 MED ORDER — DEXTROSE IN LACTATED RINGERS 5 % IV SOLN
INTRAVENOUS | Status: DC
  Administered 2017-03-18: 125 mL/h via INTRAVENOUS
  Administered 2017-03-19: 03:00:00 via INTRAVENOUS

## 2017-03-18 MED ORDER — DEXAMETHASONE SODIUM PHOSPHATE 10 MG/ML IJ SOLN
INTRAMUSCULAR | Status: AC
  Filled 2017-03-18: qty 1

## 2017-03-18 MED ORDER — MORPHINE SULFATE (PF) 2 MG/ML IV SOLN
INTRAVENOUS | Status: AC
  Filled 2017-03-18: qty 1

## 2017-03-18 MED ORDER — ONDANSETRON HCL 4 MG/2ML IJ SOLN
INTRAMUSCULAR | Status: AC
  Filled 2017-03-18: qty 2

## 2017-03-18 MED ORDER — ONDANSETRON HCL 4 MG/2ML IJ SOLN
4.0000 mg | Freq: Once | INTRAMUSCULAR | Status: AC
  Administered 2017-03-18: 4 mg via INTRAVENOUS
  Filled 2017-03-18: qty 2

## 2017-03-18 MED ORDER — SODIUM CHLORIDE 0.9 % IV BOLUS (SEPSIS)
1000.0000 mL | Freq: Once | INTRAVENOUS | Status: AC
  Administered 2017-03-18: 1000 mL via INTRAVENOUS

## 2017-03-18 MED ORDER — PIPERACILLIN-TAZOBACTAM 3.375 G IVPB 30 MIN
3.3750 g | Freq: Once | INTRAVENOUS | Status: AC
  Administered 2017-03-18: 3.375 g via INTRAVENOUS
  Filled 2017-03-18: qty 50

## 2017-03-18 MED ORDER — BUPIVACAINE-EPINEPHRINE (PF) 0.25% -1:200000 IJ SOLN
INTRAMUSCULAR | Status: AC
  Filled 2017-03-18: qty 30

## 2017-03-18 MED ORDER — IOPAMIDOL (ISOVUE-370) INJECTION 76%
100.0000 mL | Freq: Once | INTRAVENOUS | Status: AC | PRN
  Administered 2017-03-18: 100 mL via INTRAVENOUS

## 2017-03-18 MED ORDER — LIDOCAINE HCL (CARDIAC) 20 MG/ML IV SOLN
INTRAVENOUS | Status: DC | PRN
  Administered 2017-03-18: 80 mg via INTRAVENOUS

## 2017-03-18 MED ORDER — ONDANSETRON 4 MG PO TBDP: 4.0000 mg | ORAL_TABLET | Freq: Four times a day (QID) | ORAL | Status: DC | PRN

## 2017-03-18 MED ORDER — BUPIVACAINE LIPOSOME 1.3 % IJ SUSP
INTRAMUSCULAR | Status: DC | PRN
  Administered 2017-03-18: 70 mL

## 2017-03-18 MED ORDER — DEXAMETHASONE SODIUM PHOSPHATE 10 MG/ML IJ SOLN
INTRAMUSCULAR | Status: DC | PRN
  Administered 2017-03-18: 5 mg via INTRAVENOUS

## 2017-03-18 MED ORDER — PIPERACILLIN-TAZOBACTAM 3.375 G IVPB
3.3750 g | Freq: Three times a day (TID) | INTRAVENOUS | Status: DC
  Administered 2017-03-18 – 2017-03-23 (×15): 3.375 g via INTRAVENOUS
  Filled 2017-03-18 (×15): qty 50

## 2017-03-18 MED ORDER — FENTANYL CITRATE (PF) 100 MCG/2ML IJ SOLN
INTRAMUSCULAR | Status: AC
  Filled 2017-03-18: qty 2

## 2017-03-18 MED ORDER — PROPOFOL 10 MG/ML IV BOLUS
INTRAVENOUS | Status: DC | PRN
  Administered 2017-03-18: 50 mg via INTRAVENOUS
  Administered 2017-03-18: 20 mg via INTRAVENOUS

## 2017-03-18 MED ORDER — GLYCOPYRROLATE 0.2 MG/ML IJ SOLN
INTRAMUSCULAR | Status: AC
  Filled 2017-03-18: qty 1

## 2017-03-18 MED ORDER — EPHEDRINE SULFATE 50 MG/ML IJ SOLN
INTRAMUSCULAR | Status: AC
  Filled 2017-03-18: qty 1

## 2017-03-18 MED ORDER — MAGNESIUM SULFATE 2 GM/50ML IV SOLN
2.0000 g | Freq: Once | INTRAVENOUS | Status: AC
  Administered 2017-03-18: 2 g via INTRAVENOUS
  Filled 2017-03-18: qty 50

## 2017-03-18 MED ORDER — LIDOCAINE HCL (PF) 2 % IJ SOLN
INTRAMUSCULAR | Status: AC
  Filled 2017-03-18: qty 2

## 2017-03-18 MED ORDER — ROCURONIUM BROMIDE 50 MG/5ML IV SOLN
INTRAVENOUS | Status: AC
  Filled 2017-03-18: qty 1

## 2017-03-18 MED ORDER — MORPHINE SULFATE (PF) 2 MG/ML IV SOLN
2.0000 mg | INTRAVENOUS | Status: AC
  Administered 2017-03-18: 2 mg via INTRAVENOUS

## 2017-03-18 MED ORDER — LISINOPRIL 10 MG PO TABS
10.0000 mg | ORAL_TABLET | Freq: Once | ORAL | Status: AC
  Administered 2017-03-18: 10 mg via ORAL
  Filled 2017-03-18: qty 1

## 2017-03-18 MED ORDER — PANTOPRAZOLE SODIUM 40 MG IV SOLR
40.0000 mg | Freq: Two times a day (BID) | INTRAVENOUS | Status: DC
  Administered 2017-03-18 – 2017-03-23 (×11): 40 mg via INTRAVENOUS
  Filled 2017-03-18 (×11): qty 40

## 2017-03-18 MED ORDER — PROPOFOL 1000 MG/100ML IV EMUL
5.0000 ug/kg/min | INTRAVENOUS | Status: DC
  Administered 2017-03-18 (×2): 5 ug/kg/min via INTRAVENOUS
  Filled 2017-03-18 (×2): qty 100

## 2017-03-18 MED ORDER — ENOXAPARIN SODIUM 30 MG/0.3ML ~~LOC~~ SOLN
30.0000 mg | SUBCUTANEOUS | Status: DC
  Administered 2017-03-19: 30 mg via SUBCUTANEOUS
  Filled 2017-03-18: qty 0.3

## 2017-03-18 MED ORDER — BUPIVACAINE-EPINEPHRINE (PF) 0.25% -1:200000 IJ SOLN
INTRAMUSCULAR | Status: DC | PRN
  Administered 2017-03-18: 30 mL

## 2017-03-18 MED ORDER — PHENYLEPHRINE HCL 10 MG/ML IJ SOLN
INTRAMUSCULAR | Status: AC
  Filled 2017-03-18: qty 1

## 2017-03-18 MED ORDER — ONDANSETRON HCL 4 MG/2ML IJ SOLN
INTRAMUSCULAR | Status: DC | PRN
  Administered 2017-03-18: 4 mg via INTRAVENOUS

## 2017-03-18 MED ORDER — ONDANSETRON HCL 4 MG/2ML IJ SOLN: 4.0000 mg | Freq: Four times a day (QID) | INTRAMUSCULAR | Status: DC | PRN

## 2017-03-18 MED ORDER — ALBUMIN HUMAN 5 % IV SOLN
25.0000 g | Freq: Once | INTRAVENOUS | Status: AC
Start: 2017-03-18 — End: 2017-03-19
  Administered 2017-03-18: 25 g via INTRAVENOUS
  Filled 2017-03-18 (×2): qty 500

## 2017-03-18 MED ORDER — SUCCINYLCHOLINE CHLORIDE 20 MG/ML IJ SOLN
INTRAMUSCULAR | Status: AC
  Filled 2017-03-18: qty 1

## 2017-03-18 MED ORDER — ORAL CARE MOUTH RINSE
15.0000 mL | OROMUCOSAL | Status: DC
  Administered 2017-03-18 – 2017-03-20 (×15): 15 mL via OROMUCOSAL

## 2017-03-18 MED ORDER — FENTANYL CITRATE (PF) 100 MCG/2ML IJ SOLN
INTRAMUSCULAR | Status: DC | PRN
  Administered 2017-03-18 (×2): 25 ug via INTRAVENOUS

## 2017-03-18 MED ORDER — SODIUM CHLORIDE 0.9 % IJ SOLN
INTRAMUSCULAR | Status: AC
  Filled 2017-03-18: qty 50

## 2017-03-18 MED ORDER — ROCURONIUM BROMIDE 100 MG/10ML IV SOLN
INTRAVENOUS | Status: DC | PRN
  Administered 2017-03-18: 30 mg via INTRAVENOUS
  Administered 2017-03-18: 20 mg via INTRAVENOUS

## 2017-03-18 MED ORDER — MORPHINE SULFATE (PF) 4 MG/ML IV SOLN
4.0000 mg | Freq: Once | INTRAVENOUS | Status: AC
  Administered 2017-03-18: 4 mg via INTRAVENOUS
  Filled 2017-03-18: qty 1

## 2017-03-18 MED ORDER — PIPERACILLIN-TAZOBACTAM 3.375 G IVPB
INTRAVENOUS | Status: AC
  Administered 2017-03-18: 3.375 g via INTRAVENOUS
  Filled 2017-03-18: qty 50

## 2017-03-18 MED ORDER — MIDAZOLAM HCL 2 MG/2ML IJ SOLN
INTRAMUSCULAR | Status: DC | PRN
  Administered 2017-03-18: 2 mg via INTRAVENOUS

## 2017-03-18 MED ORDER — PHENYLEPHRINE HCL 10 MG/ML IJ SOLN
INTRAMUSCULAR | Status: DC | PRN
  Administered 2017-03-18 (×11): 100 ug via INTRAVENOUS

## 2017-03-18 MED ORDER — ONDANSETRON HCL 4 MG/2ML IJ SOLN: 4.0000 mg | Freq: Once | INTRAMUSCULAR | Status: DC | PRN

## 2017-03-18 MED ORDER — DEXTROSE 5 % IV SOLN
0.0000 ug/min | INTRAVENOUS | Status: DC
  Administered 2017-03-18: 2 ug/min via INTRAVENOUS
  Filled 2017-03-18 (×2): qty 4

## 2017-03-18 MED ORDER — ALBUMIN HUMAN 5 % IV SOLN
INTRAVENOUS | Status: AC
  Filled 2017-03-18: qty 250

## 2017-03-18 MED ORDER — MIDAZOLAM HCL 2 MG/2ML IJ SOLN
INTRAMUSCULAR | Status: AC
  Filled 2017-03-18: qty 2

## 2017-03-18 MED ORDER — ALBUMIN HUMAN 5 % IV SOLN
25.0000 g | Freq: Once | INTRAVENOUS | Status: AC
Start: 2017-03-18 — End: 2017-03-18
  Administered 2017-03-18: 13:00:00 via INTRAVENOUS

## 2017-03-18 MED ORDER — CHLORHEXIDINE GLUCONATE 0.12% ORAL RINSE (MEDLINE KIT)
15.0000 mL | Freq: Two times a day (BID) | OROMUCOSAL | Status: DC
  Administered 2017-03-18 – 2017-03-20 (×4): 15 mL via OROMUCOSAL

## 2017-03-18 SURGICAL SUPPLY — 49 items
APPLIER CLIP 11 MED OPEN (CLIP) ×3
APPLIER CLIP 13 LRG OPEN (CLIP) ×3
BLADE CLIPPER SURG (BLADE) ×3 IMPLANT
BLADE SURG 15 STRL LF DISP TIS (BLADE) ×1 IMPLANT
BLADE SURG 15 STRL SS (BLADE) ×2
CANISTER SUCT 3000ML PPV (MISCELLANEOUS) ×6 IMPLANT
CHLORAPREP W/TINT 26ML (MISCELLANEOUS) ×3 IMPLANT
CLIP APPLIE 11 MED OPEN (CLIP) ×1 IMPLANT
CLIP APPLIE 13 LRG OPEN (CLIP) ×1 IMPLANT
DRAPE LAPAROTOMY 100X77 ABD (DRAPES) ×3 IMPLANT
DRAPE TABLE BACK 80X90 (DRAPES) ×3 IMPLANT
DRSG OPSITE POSTOP 4X10 (GAUZE/BANDAGES/DRESSINGS) ×3 IMPLANT
DRSG TEGADERM 2-3/8X2-3/4 SM (GAUZE/BANDAGES/DRESSINGS) ×6 IMPLANT
DRSG TELFA 3X8 NADH (GAUZE/BANDAGES/DRESSINGS) ×3 IMPLANT
ELECT BLADE 6.5 EXT (BLADE) ×3 IMPLANT
ELECT REM PT RETURN 9FT ADLT (ELECTROSURGICAL) ×3
ELECTRODE REM PT RTRN 9FT ADLT (ELECTROSURGICAL) ×1 IMPLANT
GAUZE SPONGE 4X4 12PLY STRL (GAUZE/BANDAGES/DRESSINGS) ×6 IMPLANT
GLOVE BIO SURGEON STRL SZ7 (GLOVE) ×3 IMPLANT
GOWN STRL REUS W/ TWL LRG LVL3 (GOWN DISPOSABLE) ×2 IMPLANT
GOWN STRL REUS W/TWL LRG LVL3 (GOWN DISPOSABLE) ×4
HANDLE SUCTION POOLE (INSTRUMENTS) ×1 IMPLANT
HANDLE YANKAUER SUCT BULB TIP (MISCELLANEOUS) ×6 IMPLANT
LIGASURE IMPACT 36 18CM CVD LR (INSTRUMENTS) ×3 IMPLANT
NEEDLE HYPO 22GX1.5 SAFETY (NEEDLE) ×6 IMPLANT
NEEDLE HYPO 25X1 1.5 SAFETY (NEEDLE) ×3 IMPLANT
NS IRRIG 1000ML POUR BTL (IV SOLUTION) ×6 IMPLANT
PACK BASIN MAJOR ARMC (MISCELLANEOUS) ×3 IMPLANT
RELOAD PROXIMATE 75MM BLUE (ENDOMECHANICALS) ×21 IMPLANT
SPONGE LAP 18X18 5 PK (GAUZE/BANDAGES/DRESSINGS) ×9 IMPLANT
SPONGE LAP 18X36 2PK (MISCELLANEOUS) IMPLANT
STAPLER PROXIMATE 75MM BLUE (STAPLE) ×3 IMPLANT
STAPLER SKIN PROX 35W (STAPLE) ×3 IMPLANT
SUCTION POOLE HANDLE (INSTRUMENTS) ×3
SUT PDS AB 0 CT1 27 (SUTURE) ×6 IMPLANT
SUT PDS AB 1 TP1 96 (SUTURE) ×6 IMPLANT
SUT SILK 2 0 (SUTURE) ×4
SUT SILK 2 0 SH CR/8 (SUTURE) ×6 IMPLANT
SUT SILK 2 0SH CR/8 30 (SUTURE) ×3 IMPLANT
SUT SILK 2-0 18XBRD TIE 12 (SUTURE) ×2 IMPLANT
SUT VIC AB 0 CT1 36 (SUTURE) ×12 IMPLANT
SUT VIC AB 0 SH 27 (SUTURE) ×6 IMPLANT
SUT VIC AB 2-0 SH 27 (SUTURE) ×6
SUT VIC AB 2-0 SH 27XBRD (SUTURE) ×3 IMPLANT
SYR 30ML LL (SYRINGE) ×6 IMPLANT
SYR 3ML LL SCALE MARK (SYRINGE) ×3 IMPLANT
TAPE MICROFOAM 4IN (TAPE) ×3 IMPLANT
TOWEL OR 17X26 4PK STRL BLUE (TOWEL DISPOSABLE) ×3 IMPLANT
TRAY FOLEY W/METER SILVER 16FR (SET/KITS/TRAYS/PACK) ×3 IMPLANT

## 2017-03-18 NOTE — ED Triage Notes (Signed)
Pt arrived via EMS from home with reports of abdominal pain since last night. Pt states she went to bed around 830 last night and woke around 930pm with vomiting and abdominal pain around navel.  Per EMS pt has palpable mass under navel. Pt is alert and oriented at this time.  Pt has hx of hyperglycemia. Pt c/o abd pain worse when moving around.  Pt states she took her Lisinopril last night, but started to vomiting not long after so she isn't sure if she got it down or not.

## 2017-03-18 NOTE — Anesthesia Preprocedure Evaluation (Addendum)
Anesthesia Evaluation  Patient identified by MRN, date of birth, ID band Patient awake    Reviewed: Allergy & Precautions, NPO status , Patient's Chart, lab work & pertinent test results  Airway Mallampati: III  TM Distance: <3 FB     Dental  (+) Upper Dentures, Lower Dentures   Pulmonary former smoker,    Pulmonary exam normal        Cardiovascular hypertension, + dysrhythmias Atrial Fibrillation  Rhythm:Irregular     Neuro/Psych negative neurological ROS  negative psych ROS   GI/Hepatic Neg liver ROS,   Endo/Other  negative endocrine ROS  Renal/GU negative Renal ROS     Musculoskeletal negative musculoskeletal ROS (+)   Abdominal (+)  Abdomen: tender.    Peds negative pediatric ROS (+)  Hematology negative hematology ROS (+)   Anesthesia Other Findings Past Medical History: 12/25/2013: Hypertension 01/06/2015: Macular degeneration of left eye No date: Osteoporosis  New onset Afib/flutter with a controlled rate.  Lab evidence for metabolic acidosis   Reproductive/Obstetrics                            Anesthesia Physical Anesthesia Plan  ASA: III and emergent  Anesthesia Plan: General   Post-op Pain Management:    Induction: Intravenous, Rapid sequence and Cricoid pressure planned  PONV Risk Score and Plan:   Airway Management Planned: Oral ETT  Additional Equipment:   Intra-op Plan:   Post-operative Plan: Possible Post-op intubation/ventilation  Informed Consent: I have reviewed the patients History and Physical, chart, labs and discussed the procedure including the risks, benefits and alternatives for the proposed anesthesia with the patient or authorized representative who has indicated his/her understanding and acceptance.   Dental advisory given  Plan Discussed with: CRNA and Anesthesiologist  Anesthesia Plan Comments:         Anesthesia Quick  Evaluation

## 2017-03-18 NOTE — ED Notes (Signed)
Transported to CT 

## 2017-03-18 NOTE — ED Notes (Signed)
Helped patient to the bathroom. 

## 2017-03-18 NOTE — ED Notes (Signed)
Hooked patient up to monitor. 

## 2017-03-18 NOTE — Op Note (Signed)
PROCEDURES: 1. Laparotomy with reduction of internal hernia 2. Small bowel resection with primary anastomosis  Pre-operative Diagnosis: Ischemic bowel  Post-operative Diagnosis: same  Surgeon: Merri Rayiego F Magnolia Mattila    Anesthesia: General endotracheal anesthesia  ASA Class: 4  Surgeon: Paula Bigiego Deiondra Denley , MD FACS  Anesthesia: Gen. with endotracheal tube  Findings: 1. 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 600cc of hemorrhagic ascitis 3. Viable bowel and anastomosis at the end of the procedure  Estimated Blood Loss: 100cc                Specimens: SB       Complications: none                Procedure Details  The patient was seen again in the Holding Room. The benefits, complications, treatment options, and expected outcomes were discussed with the patient. The risks of bleeding, infection, recurrence of symptoms, failure to resolve symptoms,  bowel injury, any of which could require further surgery were reviewed with the patient.   The patient was taken to Operating Room, identified as Paula Luna and the procedure verified.  A Time Out was held and the above information confirmed.  Prior to the induction of general anesthesia, antibiotic prophylaxis was administered. VTE prophylaxis was in place. General endotracheal anesthesia was then administered and tolerated well. After the induction, the abdomen was prepped with Chloraprep and draped in the sterile fashion. The patient was positioned in the supine position. There is a midline laparotomy was performed using a 10 blade knife and electrocautery was used to incise the fascia and enter the abdominal cavity under direct visualization. We immediately observed hemorrhagic ascites that was aspirated. There is some adhesions from the omentum to abdominal wall. We did visualize a segment of the small bowel that was ischemic. There was a an internal hernia caused by some mesenteric band. I was able to  divide the band using a LigaSure device. After reducing the internal hernia there was a persistent segment of ischemic bowel therefore I decided to perform a bowel resection. A small window was created within the mesentery of the bowel using electrocautery and with a standard GIA 75 stapler I divided the proximal distal end in the standard fashion. A side-to-side functional end to end stapled anastomosis was performed. After a few minutes I noticed that there was some ischemic changes at the anastomosis site therefore I redid the anastomosis taking an additional 10 cm of the small bowel in the standard fashion. We re-created the anastomosis with a standard GIA stapler. The mesenteric defect was closed with a 2-0 running Vicryl. There was excellent perfusion to the anastomosis and no evidence of any ischemia. There was also no tension. Restasis small bowel had no additional pathology. We confirmed the adequate position of the NG tube. The mesentery of the bowel had great pulse and no evidence of embolic events. Abdominal cavity was irrigated with normal saline in the standard fashion. We changed gloves and with a new closing tray the fascia was approximated with a 0 PDS in the standard fashion. Liposomal Marcaine was injected throughout the abdominal cavity for analgesia. Skin was closed with staples.    Needle and laparotomy count were correct and there were no immediate compications  Paula Bigiego Tee Richeson, MD, FACS

## 2017-03-18 NOTE — Transfer of Care (Signed)
Immediate Anesthesia Transfer of Care Note  Patient: Paula CornsRoxanne D Griner  Procedure(s) Performed: Procedure(s): EXPLORATORY LAPAROTOMY, SMALL BOWEL RESECTION (N/A)  Patient Location: ICU 7  Anesthesia Type:General  Level of Consciousness: sedated  Airway & Oxygen Therapy: Patient Spontanous Breathing and Patient connected to face mask oxygen  Post-op Assessment: Report given to RN and Post -op Vital signs reviewed and stable  Post vital signs: Reviewed and stable  Last Vitals:  Vitals:   03/18/17 1420 03/18/17 1429  BP: (!) 153/59   Pulse:  82  Resp: 15 15  Temp:      Complications: No apparent anesthesia complications

## 2017-03-18 NOTE — ED Notes (Signed)
Dr. Everlene FarrierPabon at bedside to talk with  Patient re: surgery

## 2017-03-18 NOTE — ED Provider Notes (Signed)
[facility] Emergency Department Provider Note  ____________________________________________  Time seen: Approximately 8:01 AM  I have reviewed the triage vital signs and the nursing notes.   HISTORY  Chief Complaint Abdominal Pain   HPI Paula Luna is a 83 y.o. female with a history of hypertension and osteoporosis who presents for evaluation of abdominal pain. Patient reports that she was up all night with several episodes of nonbloody nonbilious emesis. She is complaining of dull constant abdominal pain that has been present throughout the night currently 5 out of 10 located in her lower abdomen, constant and non radiating. Patient reports she has chronic constipation and usually takes Colace at home. Her last bowel movement was 2 days ago. It is not atypical for patient to go a day or 2 without a bowel movement. She has never had SBO in the past. She has had hysterectomy and appendectomy in the past. She also reports that since this morning she's been having dysuria. No fever or chills, no chest pain or shortness of breath, no cough or congestion.  Past Medical History:  Diagnosis Date  . Hypertension 12/25/2013  . Macular degeneration of left eye 01/06/2015  . Osteoporosis     Patient Active Problem List   Diagnosis Date Noted  . Macular degeneration of left eye 01/06/2015  . Obesity (BMI 30-39.9) 12/25/2013  . Essential hypertension, benign 12/25/2013  . BACK PAIN 08/02/2010    Past Surgical History:  Procedure Laterality Date  . CLOSED REDUCTION PROXIMAL HUMERUS FRACTURE Left 2017  . TOTAL ABDOMINAL HYSTERECTOMY      Prior to Admission medications   Medication Sig Start Date End Date Taking? Authorizing Provider  docusate sodium (COLACE) 100 MG capsule Take 100 mg by mouth daily as needed for mild constipation.   Yes [provider]  ibuprofen (ADVIL,MOTRIN) 200 MG tablet Take 200 mg by mouth as needed.   Yes [provider]  lisinopril (PRINIVIL,ZESTRIL) 10 MG tablet TAKE ONE TABLET BY MOUTH ONCE DAILY 01/10/17  Yes Copland, Karleen HampshireSpencer, MD  Multiple Vitamins-Minerals (ICAPS) CAPS Take 1 capsule by mouth daily.   Yes [provider]    Allergies Latex; Tetanus toxoid; and Alendronate sodium  History reviewed. No pertinent family history.  Social History Social History  Substance Use Topics  . Smoking status: Former Games developermoker  . Smokeless tobacco: Never Used  . Alcohol use No    Review of Systems  Constitutional: Negative for fever. Eyes: Negative for visual changes. ENT: Negative for sore throat. Neck: No neck pain  Cardiovascular: Negative for chest pain. Respiratory: Negative for shortness of breath. Gastrointestinal: + lower abdominal pain, nausea, and vomiting. No diarrhea. Genitourinary: + dysuria. Musculoskeletal: Negative for back pain. Skin: Negative for rash. Neurological: Negative for headaches, weakness or numbness. Psych: No SI or HI  ____________________________________________   PHYSICAL EXAM:  VITAL SIGNS: ED Triage Vitals  Enc Vitals Group     BP 03/18/17 0756 (!) 220/120     Pulse Rate 03/18/17 0756 80     Resp 03/18/17 0756 (!) 30     Temp 03/18/17 0756 (!) 97.5 F (36.4 C)     Temp Source 03/18/17 0756 Oral     SpO2 03/18/17 0746 99 %     Weight 03/18/17 0753 140 lb (63.5 kg)     Height 03/18/17 0753 5\' 4"  (1.626 m)     Head Circumference --      Peak Flow --      Pain Score  03/18/17 0753 5     Pain Loc --      Pain Edu? --      Excl. in GC? --     Constitutional: Alert and oriented. Well appearing and in no apparent distress. HEENT:      Head: Normocephalic and atraumatic.         Eyes: Conjunctivae are normal. Sclera is non-icteric.       Mouth/Throat: Mucous membranes are moist.       Neck: Supple with no signs of meningismus. Cardiovascular: Regular rate and rhythm. No murmurs, gallops, or rubs. 2+ symmetrical distal pulses are present  in all extremities. No JVD. Respiratory: Normal respiratory effort. Lungs are clear to auscultation bilaterally. No wheezes, crackles, or rhonchi.  Gastrointestinal: Soft, ttp over the lower quadrants worse in the periumbillical and LLQ, hard palpable mass felt on the middle of her lower abdomen, non distended with positive bowel sounds. No rebound or guarding. Genitourinary: No CVA tenderness. Musculoskeletal: Nontender with normal range of motion in all extremities. No edema, cyanosis, or erythema of extremities. Neurologic: Normal speech and language. Face is symmetric. Moving all extremities. No gross focal neurologic deficits are appreciated. Skin: Skin is warm, dry and intact. No rash noted. Psychiatric: Mood and affect are normal. Speech and behavior are normal.  ____________________________________________   LABS (all labs ordered are listed, but only abnormal results are displayed)  Labs Reviewed  CBC WITH DIFFERENTIAL/PLATELET - Abnormal; Notable for the following:       Result Value   WBC 14.4 (*)    RBC 5.53 (*)    MCV 79.2 (*)    MCH 25.5 (*)    RDW 15.9 (*)    Neutro Abs 13.2 (*)    Lymphs Abs 0.6 (*)    All other components within normal limits  COMPREHENSIVE METABOLIC PANEL - Abnormal; Notable for the following:    Potassium 3.4 (*)    Chloride 100 (*)    Glucose, Bld 316 (*)    ALT 10 (*)    All other components within normal limits  URINALYSIS, COMPLETE (UACMP) WITH MICROSCOPIC - Abnormal; Notable for the following:    Color, Urine YELLOW (*)    APPearance CLEAR (*)    Glucose, UA >=500 (*)    Hgb urine dipstick SMALL (*)    Ketones, ur 20 (*)    Protein, ur 100 (*)    Squamous Epithelial / LPF 0-5 (*)    All other components within normal limits  LACTIC ACID, PLASMA - Abnormal; Notable for the following:    Lactic Acid, Venous 5.0 (*)    All other components within normal limits  LIPASE, BLOOD  TROPONIN I  TSH  MAGNESIUM  TYPE AND SCREEN    ____________________________________________  EKG  ED ECG REPORT I, Nita Sickle, the attending physician, personally viewed and interpreted this ECG.  Atrial fibrillation, rate of 82, normal QTC, normal axis, no ST elevation, slight depressions on lateral leads. No prior for comparison ____________________________________________  RADIOLOGY  CT a/p: No evidence of significant mesenteric arterial occlusive disease by CTA or mesenteric venous thrombus. 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 Probable significant proximal left renal artery stenosis.  NON-VASCULAR  Significant edema and dilatation of small bowel at the level of the distal jejunum/proximal ileum extending into the pelvis. These loops are fluid filled and thickened and by imaging are suspicious for ischemic/necrotic small bowel. General surgical consultation recommended. No associated evidence of bowel perforation or focal  abscess by CT. ____________________________________________   PROCEDURES  Procedure(s) performed: None Procedures Critical Care performed: yes  CRITICAL CARE Performed by: Nita Sickle  ?  Total critical care time: 35 min  Critical care time was exclusive of separately billable procedures and treating other patients.  Critical care was necessary to treat or prevent imminent or life-threatening deterioration.  Critical care was time spent personally by me on the following activities: development of treatment plan with patient and/or surrogate as well as nursing, discussions with consultants, evaluation of patient's response to treatment, examination of patient, obtaining history from patient or surrogate, ordering and performing treatments and interventions, ordering and review of laboratory studies, ordering and review of radiographic studies, pulse oximetry and re-evaluation of patient's  condition.  ____________________________________________   INITIAL IMPRESSION / ASSESSMENT AND PLAN / ED COURSE  83 y.o. female with a history of hypertension and osteoporosis who presents for evaluation of lower constant abdominal pain and vomiting since last night. Patient is well-appearing, in no distress, she severely hypertensive with BP of 220/120. She takes lisinopril at that time and is afraid that she vomited her lisinopril yesterday evening. We'll re-dose this morning. Remaining of her vital signs are within normal limits. Her abdomen is soft with no distention and normal bowel sounds, patient does have a palpable mass in her lower abdomen which could be consistent with constipation for possible malignancy. No pulsatile mass. EKG with new A. fib with well-controlled rate. We'll check CBC, CMP, lipase, urinalysis, lactate, and CT abdomen and pelvis. We treated her pain with 2 mg of IV morphine, Zofran, and fluids. We'll check electrolytes and troponin. We'll monitor closely on telemetry.  Clinical Course as of Mar 18 1028  Sat Mar 18, 2017  1027 CT concerning for ischemic necrosis of small bowel possibly from small vessel disease with no large vessel obstruction. I discussed with Dr. Everlene Farrier, surgeon on call for admission. Patient will be given Zosyn and continued to be resuscitated with IV fluids. Repeat lactic acid to be done after the second bolus. Patient and patient's daughter has been updated of the status of CT scan and further recommendations for surgical management.  [CV]    Clinical Course User Index [CV] Nita Sickle, MD    Pertinent labs & imaging results that were available during my care of the patient were reviewed by me and considered in my medical decision making (see chart for details).    ____________________________________________   FINAL CLINICAL IMPRESSION(S) / ED DIAGNOSES  Final diagnoses:  Atrial fibrillation, unspecified type (HCC)  Ischemic  necrosis of small bowel (HCC)      NEW MEDICATIONS STARTED DURING THIS VISIT:  New Prescriptions   No medications on file     Note:  This document was prepared using Dragon voice recognition software and may include unintentional dictation errors.    Don Perking, [place], MD 03/18/17 [phone]

## 2017-03-18 NOTE — Anesthesia Procedure Notes (Signed)
Procedure Name: Intubation Date/Time: 03/18/2017 12:13 PM Performed by: Doreen Salvage Pre-anesthesia Checklist: Patient identified, Emergency Drugs available, Suction available and Patient being monitored Patient Re-evaluated:Patient Re-evaluated prior to inductionOxygen Delivery Method: Circle system utilized Preoxygenation: Pre-oxygenation with 100% oxygen Intubation Type: IV induction, Cricoid Pressure applied and Rapid sequence Ventilation: Mask ventilation without difficulty Laryngoscope Size: Mac and 3 Grade View: Grade II Tube type: Oral Tube size: 7.0 mm Number of attempts: 1 Airway Equipment and Method: Stylet Placement Confirmation: ETT inserted through vocal cords under direct vision,  positive ETCO2 and breath sounds checked- equal and bilateral Secured at: 21 cm Tube secured with: Tape Dental Injury: Teeth and Oropharynx as per pre-operative assessment

## 2017-03-18 NOTE — Anesthesia Post-op Follow-up Note (Cosign Needed)
Anesthesia QCDR form completed.        

## 2017-03-18 NOTE — Consult Note (Signed)
[facility] Critical Care Medicine Consultation    SYNOPSIS   Patient 83 year old female with acute abdominal pain, status post exploratory laparotomy, reduction of internal hernia and small bowel resection for ischemia  ASSESSMENT/PLAN   Ischemic bowel, status post small bowel resection and reduction of internal hernia from a mesenteric band. Minimal blood loss. Left intubated on mechanical ventilation. On mechanical ventilation protocol, obtain chest x-ray, arterial blood gas. When patient wakes up will begin spontaneous awakening/breathing trials  Lactic acidosis. Secondary to bowel ischemia  Hypokalemia. Will repeat BMP  Hyperglycemia. Place on sliding scale coverage   Name: Paula Luna MRN: [id number] DOB: 1924/09/28    ADMISSION DATE:  03/18/2017 CONSULTATION DATE:  03/18/2017  REFERRING MD :  Dr. Everlene Farrier  CHIEF COMPLAINT:  Postoperative management of small bowel resection   HISTORY OF PRESENT ILLNESS:  Paula Luna is a 83 year old female with a past medical history remarkable for hypertension, osteoporosis, macular degeneration, presented to the emergency department with acute abdominal pain, moderate to severe in nature, diffuse, nausea, vomiting. CT scan revealed evidence of ischemic bowel with dilation of the jejunum, elevated lactic acid of 5, elevated white count of 14,000. Patient is status post exploratory laparotomy, small bowel resection, has received aggressive fluid resuscitation and antibiotic therapy, arrives from the operating room intubated.  PAST MEDICAL HISTORY :  Past Medical History:  Diagnosis Date  . Hypertension 12/25/2013  . Macular degeneration of left eye 01/06/2015  . Osteoporosis    Past Surgical History:  Procedure Laterality Date  . CLOSED REDUCTION PROXIMAL HUMERUS FRACTURE Left 2017  . TOTAL ABDOMINAL HYSTERECTOMY     Prior to Admission medications   Medication Sig Start Date End Date Taking? Authorizing Provider  docusate sodium  (COLACE) 100 MG capsule Take 100 mg by mouth daily as needed for mild constipation.   Yes [provider]  ibuprofen (ADVIL,MOTRIN) 200 MG tablet Take 200 mg by mouth as needed.   Yes [provider]  lisinopril (PRINIVIL,ZESTRIL) 10 MG tablet TAKE ONE TABLET BY MOUTH ONCE DAILY 01/10/17  Yes Copland, Karleen Hampshire, MD  Multiple Vitamins-Minerals (ICAPS) CAPS Take 1 capsule by mouth daily.   Yes [provider]   Allergies  Allergen Reactions  . Latex Rash  . Tetanus Toxoid Anaphylaxis    REACTION: anaphylaxis  . Alendronate Sodium     REACTION: passed out    FAMILY HISTORY:  History reviewed. No pertinent family history. SOCIAL HISTORY:  reports that she has quit smoking. She has never used smokeless tobacco. She reports that she does not drink alcohol or use drugs.  REVIEW OF SYSTEMS:   Patient is intubated, sedated, unable to provide review of systems  VITAL SIGNS: Temp:  [97.5 F (36.4 C)] 97.5 F (36.4 C) (07/07 0756) Pulse Rate:  [62-82] 82 (07/07 1429) Resp:  [15-32] 15 (07/07 1429) BP: (122-212)/(54-96) 153/59 (07/07 1420) SpO2:  [91 %-100 %] 95 % (07/07 1429) Arterial Line BP: (159)/(70) 159/70 (07/07 1429) Weight:  [63.5 kg (140 lb)-67.6 kg (149 lb 0.5 oz)] 67.6 kg (149 lb 0.5 oz) (07/07 1429) HEMODYNAMICS:     INTAKE / OUTPUT:  Intake/Output Summary (Last 24 hours) at 03/18/17 1435 Last data filed at 03/18/17 1429  Gross per 24 hour  Intake             2950 ml  Output              600 ml  Net  2350 ml    Physical Examination:   VS: BP (!) 153/59 (BP Location: Right Wrist)   Pulse 82   Temp (!) 97.5 F (36.4 C) (Oral)   Resp 15   Ht 5\' 4"  (1.626 m)   Wt 67.6 kg (149 lb 0.5 oz)   SpO2 95%   BMI 25.58 kg/m   General Appearance: No distress  Neuro: Limited exam, presently intubated postop small bowel resection. HEENT: PERRLA, EOM intact, no ptosis, no other lesions noticed;  Pulmonary: normal breath sounds.,  diaphragmatic excursion normal. Cardiovascular irregular rhythm, appears to be sinus arrhythmia on monitor, systolic murmur appreciated left parasternal border Abdomen: No bowel sounds, midline surgical dressing noted Endoc: No evident thyromegaly, no signs of acromegaly. Skin:   warm, no rashes, no ecchymosis  Extremities: normal, no cyanosis, clubbing, no edema, warm with normal capillary refill.    LABS: Reviewed   LABORATORY PANEL:   CBC  Recent Labs Lab 03/18/17 0755  WBC 14.4*  HGB 14.1  HCT 43.8  PLT 255    Chemistries   Recent Labs Lab 03/18/17 0755 03/18/17 0805  NA 137  --   K 3.4*  --   CL 100*  --   CO2 23  --   GLUCOSE 316*  --   BUN 14  --   CREATININE 0.81  --   CALCIUM 9.1  --   MG  --  1.9  AST 31  --   ALT 10*  --   ALKPHOS 97  --   BILITOT 0.7  --     No results for input(s): GLUCAP in the last 168 hours. No results for input(s): PHART, PCO2ART, PO2ART in the last 168 hours.  Recent Labs Lab 03/18/17 0755  AST 31  ALT 10*  ALKPHOS 97  BILITOT 0.7  ALBUMIN 4.2    Cardiac Enzymes  Recent Labs Lab 03/18/17 0755  TROPONINI <0.03    RADIOLOGY:  Ct Angio Abd/pel W And/or Wo Contrast  Result Date: 03/18/2017 CLINICAL DATA:  Abdominal pain with nausea and vomiting. EXAM: CT ANGIOGRAPHY ABDOMEN AND PELVIS WITH CONTRAST AND WITHOUT CONTRAST TECHNIQUE: Multidetector CT imaging of the abdomen and pelvis was performed using the standard protocol during bolus administration of intravenous contrast. Multiplanar reconstructed images and MIPs were obtained and reviewed to evaluate the vascular anatomy. CONTRAST:  100 mL Isovue 370 IV COMPARISON:  None. FINDINGS: VASCULAR Aorta: Aortic atherosclerosis noted with mild dilatation of the abdominal aorta up to maximal caliber of 2.9 cm beginning just above the renal arteries and continuing to a juxta-renal position. The distal aorta is not dilated. No evidence of aortic dissection, significant aortic  thrombus or stenosis. Celiac: No significant stenosis. Distal aspect of the celiac trunk demonstrates mild aneurysmal dilatation and measures up to 8 mm in diameter. Distal branches show normal patency and branching anatomy. SMA: Normally patent.  No visualized branch vessel occlusions. Renals: Single right renal artery demonstrates proximal calcified plaque causing non critical stenosis of potentially up to approximately 50%. Single left renal artery demonstrates proximal calcified plaque likely causing significant stenosis of at least 70%. The left kidney does perfuse normally on arterial and venous phases. IMA: Normally patent. Inflow: Bilateral iliac arteries show scattered plaque without evidence of stenosis or aneurysm. Proximal Outflow: Common femoral arteries and femoral bifurcations show normal patency bilaterally. Veins: Venous phase imaging shows normal patency of mesenteric veins, portal vein and splenic vein. The IVC, iliac veins and common femoral veins show no evidence of thrombus. Review  of the MIP images confirms the above findings. NON-VASCULAR Lower chest: Bibasilar atelectasis.  No pleural effusions. Hepatobiliary: No focal liver abnormality is seen. No gallstones, gallbladder wall thickening, or biliary dilatation. Pancreas: Unremarkable. No pancreatic ductal dilatation or surrounding inflammatory changes. Spleen: Normal in size without focal abnormality. Adrenals/Urinary Tract: Adrenal glands are unremarkable. Kidneys without renal calculi, focal lesion, or hydronephrosis. The left kidney demonstrates mild cortical atrophy. Bladder is unremarkable. Stomach/Bowel: There is significant inflammation and thickening of small bowel, especially over a segment of the distal jejunum/ proximal ileum extending into the mid to lower pelvis with contiguous loops demonstrating severe edema. These loops are also fluid filled and are suspicious for ischemic/ necrotic bowel. Maximal caliber of inflamed small  bowel is approximately 3 cm. Mid jejunal loops are air filled and moderately dilated. Proximal jejunum and distal ileum are decompressed. The colon is completely decompressed. No free air or focal abscess identified. There is some associated free fluid in the pelvis, paracolic gutters and around the liver. Lymphatic: No enlarged lymph nodes identified. Reproductive: Status post hysterectomy. No adnexal masses. Other: No hernias identified. Musculoskeletal: No acute or significant osseous findings. IMPRESSION: VASCULAR No evidence of significant mesenteric arterial occlusive disease by CTA or mesenteric venous thrombus. 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 Probable significant proximal left renal artery stenosis. NON-VASCULAR Significant edema and dilatation of small bowel at the level of the distal jejunum/proximal ileum extending into the pelvis. These loops are fluid filled and thickened and by imaging are suspicious for ischemic/necrotic small bowel. General surgical consultation recommended. No associated evidence of bowel perforation or focal abscess by CT. These results were called by telephone at the time of interpretation on 03/18/2017 at 10:17 am to Dr. Nita SickleAROLINA VERONESE , who verbally acknowledged these results. Electronically Signed   By: Irish LackGlenn  Yamagata M.D.   On: 03/18/2017 10:18    Tora KindredJohn Jonay Hitchcock, DO ICU Pager [phone] [facility] Office Number: [phone]  Santiago Gladavid Kasa, M.D.  Billy Fischeravid Simonds, M.D   03/18/2017, 2:35 PM

## 2017-03-18 NOTE — ED Notes (Signed)
Returned from CT.

## 2017-03-18 NOTE — Procedures (Signed)
Central Venous Catheter Insertion Procedure Note Paula Luna [id number] [birth year]  Procedure: Insertion of Central Venous Catheter Indications: Drug and/or fluid administration  Procedure Details Consent: Risks of procedure as well as the alternatives and risks of each were explained to the (patient/caregiver).  Consent for procedure obtained. Time Out: Verified patient identification, verified procedure, site/side was marked, verified correct patient position, special equipment/implants available, medications/allergies/relevent history reviewed, required imaging and test results available.  Performed  Maximum sterile technique was used including antiseptics, cap, gloves, gown, hand hygiene, mask and sheet. Skin prep: Chlorhexidine; local anesthetic administered A antimicrobial bonded/coated triple lumen catheter was placed in the right internal jugular vein using the Seldinger technique. With ultrasound guidance.  Evaluation Blood flow good Complications: No apparent complications Patient did tolerate procedure well. Chest X-ray ordered to verify placement.  CXR: normal.  Paula Luna 03/18/2017, 10:13 PM

## 2017-03-18 NOTE — Anesthesia Procedure Notes (Signed)
Arterial Line Insertion Performed by: Yves DillARROLL, PAUL, KILDUFF, DAVID, CRNA  Patient location: OR. Preanesthetic checklist: patient identified, IV checked, site marked, risks and benefits discussed, surgical consent, monitors and equipment checked, pre-op evaluation, timeout performed and anesthesia consent Lidocaine 1% used for infiltration radial was placed Catheter size: 20 G Hand hygiene performed  and maximum sterile barriers used   Attempts: 1 Procedure performed without using ultrasound guided technique. Following insertion, dressing applied. Post procedure assessment: normal and unchanged

## 2017-03-18 NOTE — ED Notes (Addendum)
Pt drank about 2oz of water at this time to take Lisinopril.  Pt had some water and 1 club cracker this morning PTA.

## 2017-03-18 NOTE — H&P (Signed)
Patient ID: Paula Luna, female   DOB: Jun 18, 1925, 83 y.o.   MRN: [id number]  HPI Paula HutchingRoxanne D Paula Luna is a 83 y.o. female asked presented to the emergency room complaining of acute onset of abdominal pain that started last night. She reports the pain was moderate to severe and was diffusely. She has some nausea and some vomiting. Pain was worsening with movement and partially relieved with narcotics use in the emergency room. She does have a history of constipation but apparently 2 weeks ago had a recent complete physical that was completely normal. She is very functional 83 year old that this with her daughter. She does all the ADLs on her own and she is very independent.. No evidence of dementia. CT scan personal review there is evidence of ischemic bowel with dilation of the jejunum. There is no evidence of occlusion of any of the mesenteric vessels. There is no overt free air. Her lactate level is 5 and a white count is 14,000. Surprisingly her creatinine is normal. Case d/w  Dr. Don PerkingVeronese in detail HPI  Past Medical History:  Diagnosis Date  . Hypertension 12/25/2013  . Macular degeneration of left eye 01/06/2015  . Osteoporosis     Past Surgical History:  Procedure Laterality Date  . CLOSED REDUCTION PROXIMAL HUMERUS FRACTURE Left 2017  . TOTAL ABDOMINAL HYSTERECTOMY      History reviewed. No pertinent family history.  Social History Social History  Substance Use Topics  . Smoking status: Former Games developermoker  . Smokeless tobacco: Never Used  . Alcohol use No    Allergies  Allergen Reactions  . Latex Rash  . Tetanus Toxoid Anaphylaxis    REACTION: anaphylaxis  . Alendronate Sodium     REACTION: passed out    Current Facility-Administered Medications  Medication Dose Route Frequency Provider Last Rate Last Dose  . albumin human 5 % solution 25 g  25 g Intravenous Once Takelia Urieta, [place], MD      . morphine 2 MG/ML injection           . piperacillin-tazobactam (ZOSYN) IVPB 3.375 g   3.375 g Intravenous Once Don PerkingVeronese, [place], MD 100 mL/hr at 03/18/17 1111 3.375 g at 03/18/17 1111   Current Outpatient Prescriptions  Medication Sig Dispense Refill  . docusate sodium (COLACE) 100 MG capsule Take 100 mg by mouth daily as needed for mild constipation.    Marland Kitchen. ibuprofen (ADVIL,MOTRIN) 200 MG tablet Take 200 mg by mouth as needed.    Marland Kitchen. lisinopril (PRINIVIL,ZESTRIL) 10 MG tablet TAKE ONE TABLET BY MOUTH ONCE DAILY 90 tablet 0  . Multiple Vitamins-Minerals (ICAPS) CAPS Take 1 capsule by mouth daily.       Review of Systems Full ROS  was asked and was negative except for the information on the HPI  Physical Exam Blood pressure (!) 132/58, pulse 78, temperature (!) 97.5 F (36.4 C), temperature source Oral, resp. rate (!) 31, height 5\' 4"  (1.626 m), weight 63.5 kg (140 lb), SpO2 100 %. CONSTITUTIONAL: Awake and alert, dehydrated EYES: Pupils are equal, round, and reactive to light, Sclera are non-icteric. EARS, NOSE, MOUTH AND THROAT: The oropharynx is clear. The oral mucosa is pink and moist. Hearing is intact to voice. LYMPH NODES:  Lymph nodes in the neck are normal. RESPIRATORY:  Lungs are clear. There is normal respiratory effort, with equal breath sounds bilaterally, and without pathologic use of accessory muscles. CARDIOVASCULAR: Heart is regular without murmurs, gallops, or rubs. GI: The abdomen is distended TTP diffusely and some voluntary  guarding and rebound tenderness. GU: Rectal deferred.   MUSCULOSKELETAL: Normal muscle strength and tone. No cyanosis or edema.   SKIN: Turgor is good and there are no pathologic skin lesions or ulcers. NEUROLOGIC: Motor and sensation is grossly normal. Cranial nerves are grossly intact. PSYCH:  Oriented to person, place and time. Affect is normal.  Data Reviewed  I have personally reviewed the patient's imaging, laboratory findings and medical records.    Assessment/Plan 83 year old with an acute abdomen and signs and symptoms  consistent with ischemic bowel. There is no evidence of thrombosis or embolic events is on the CTA she does have a new onset of A. fib but currently is controlled. She does have an elevated white count and elevated lactate level that mandates emergent surgical intervention. I have discussed her disease process with the patient and her daughter in detail. I do think the only chance for her to survive is with aggressive therapy including laparotomy emergently and potentially multiple trips to the operating room depending on intraoperative findings. We will start aggressive fluid resuscitation, antibiotic therapy, place a Foley and place an NG tube. I have also started some albumin. She will require extensive care and also ICU care postoperatively. Discussed with the patient and the family in detail all potential complications including but not limited to: Death, prolonged ventilatory support, bleeding, infection, re-interventions, chronic pain. They understands and wishes to proceed.   Sterling Big, MD FACS General Surgeon 03/18/2017, 11:19 AM

## 2017-03-19 ENCOUNTER — Inpatient Hospital Stay: Payer: PPO

## 2017-03-19 ENCOUNTER — Encounter: Payer: Self-pay | Admitting: Surgery

## 2017-03-19 LAB — CBC
HCT: 31.6 % — ABNORMAL LOW (ref 35.0–47.0)
HEMOGLOBIN: 10.7 g/dL — AB (ref 12.0–16.0)
MCH: 26.5 pg (ref 26.0–34.0)
MCHC: 33.9 g/dL (ref 32.0–36.0)
MCV: 78.3 fL — ABNORMAL LOW (ref 80.0–100.0)
PLATELETS: 171 10*3/uL (ref 150–440)
RBC: 4.04 MIL/uL (ref 3.80–5.20)
RDW: 15.9 % — ABNORMAL HIGH (ref 11.5–14.5)
WBC: 10.5 10*3/uL (ref 3.6–11.0)

## 2017-03-19 LAB — BASIC METABOLIC PANEL
Anion gap: 6 (ref 5–15)
Anion gap: 6 (ref 5–15)
BUN: 17 mg/dL (ref 6–20)
BUN: 16 mg/dL (ref 6–20)
CHLORIDE: 107 mmol/L (ref 101–111)
CHLORIDE: 108 mmol/L (ref 101–111)
CO2: 23 mmol/L (ref 22–32)
CO2: 25 mmol/L (ref 22–32)
Calcium: 8.3 mg/dL — ABNORMAL LOW (ref 8.9–10.3)
Calcium: 8.6 mg/dL — ABNORMAL LOW (ref 8.9–10.3)
Creatinine, Ser: 0.93 mg/dL (ref 0.44–1.00)
Creatinine, Ser: 0.91 mg/dL (ref 0.44–1.00)
GFR calc non Af Amer: 54 mL/min — ABNORMAL LOW (ref >60)
GFR, EST NON AFRICAN AMERICAN: 53 mL/min — AB (ref >60)
Glucose, Bld: 259 mg/dL — ABNORMAL HIGH (ref 65–99)
Glucose, Bld: 152 mg/dL — ABNORMAL HIGH (ref 65–99)
POTASSIUM: 3.9 mmol/L (ref 3.5–5.1)
POTASSIUM: 3.9 mmol/L (ref 3.5–5.1)
SODIUM: 136 mmol/L (ref 135–145)
SODIUM: 139 mmol/L (ref 135–145)

## 2017-03-19 LAB — MAGNESIUM
MAGNESIUM: 2.3 mg/dL (ref 1.7–2.4)
MAGNESIUM: 2.2 mg/dL (ref 1.7–2.4)

## 2017-03-19 LAB — TROPONIN I: TROPONIN I: 0.03 ng/mL — AB (ref <0.03)

## 2017-03-19 LAB — GLUCOSE, CAPILLARY
GLUCOSE-CAPILLARY: 130 mg/dL — AB (ref 65–99)
Glucose-Capillary: 195 mg/dL — ABNORMAL HIGH (ref 65–99)
Glucose-Capillary: 168 mg/dL — ABNORMAL HIGH (ref 65–99)
Glucose-Capillary: 133 mg/dL — ABNORMAL HIGH (ref 65–99)
Glucose-Capillary: 135 mg/dL — ABNORMAL HIGH (ref 65–99)

## 2017-03-19 LAB — PHOSPHORUS: PHOSPHORUS: 2.9 mg/dL (ref 2.5–4.6)

## 2017-03-19 MED ORDER — INSULIN ASPART 100 UNIT/ML ~~LOC~~ SOLN: 0.0000 [IU] | SUBCUTANEOUS | Status: DC

## 2017-03-19 MED ORDER — ACETAMINOPHEN 10 MG/ML IV SOLN
1000.0000 mg | Freq: Four times a day (QID) | INTRAVENOUS | Status: AC
  Administered 2017-03-19 – 2017-03-20 (×4): 1000 mg via INTRAVENOUS
  Filled 2017-03-19 (×4): qty 100

## 2017-03-19 MED ORDER — ENOXAPARIN SODIUM 40 MG/0.4ML ~~LOC~~ SOLN
40.0000 mg | SUBCUTANEOUS | Status: DC
  Administered 2017-03-20 – 2017-03-22 (×3): 40 mg via SUBCUTANEOUS
  Filled 2017-03-19 (×3): qty 0.4

## 2017-03-19 MED ORDER — DEXTROSE-NACL 5-0.45 % IV SOLN
INTRAVENOUS | Status: DC
  Administered 2017-03-19: 13:00:00 via INTRAVENOUS
  Administered 2017-03-19: 125 mL/h via INTRAVENOUS
  Administered 2017-03-19 – 2017-03-20 (×2): via INTRAVENOUS
  Administered 2017-03-20: 125 mL/h via INTRAVENOUS

## 2017-03-19 NOTE — Progress Notes (Addendum)
Dr Everlene FarrierPabon notified of patient gradually increasing body temperature, no new orders at this time. MD would like to be notified if temp reaches 101.   Troponin result received, notified Dr Lonn Georgiaonforti. No new orders.  Patient extubated today at approx 0900. On 3l [place] with oxygen saturation WNL. Patient is alert and oriented, reporting no pain. Abdominal dressing CDI, abdomen tender to touch, patient reports that she is not passing flatus at this time. Foley in place draining to bag. Family at bedside. Resting quietly between care. Will continue to monitor.

## 2017-03-19 NOTE — Progress Notes (Signed)
Anticoagulation monitoring(Lovenox):  83yo  F ordered Lovenox 30 mg Q24h  Filed Weights   03/18/17 0753 03/18/17 1429  Weight: 140 lb (63.5 kg) 149 lb 0.5 oz (67.6 kg)   BMI 25.6   Lab Results  Component Value Date   CREATININE 0.93 03/19/2017   CREATININE 0.72 03/18/2017   CREATININE 0.81 03/18/2017   Estimated Creatinine Clearance: 38 mL/min (by C-G formula based on SCr of 0.93 mg/dL). Hemoglobin & Hematocrit     Component Value Date/Time   HGB 10.7 (L) 03/19/2017 0417   HCT 31.6 (L) 03/19/2017 0417     Per Protocol for Patient with estCrcl > 30 ml/min and BMI < 40, will transition to Lovenox 40 mg Q24h      Bari MantisKristin Mylea Roarty PharmD Clinical Pharmacist 03/19/2017

## 2017-03-19 NOTE — Progress Notes (Signed)
Patient extubated to 2l [place] per md request.

## 2017-03-19 NOTE — Progress Notes (Signed)
POD # 1 s/p SB resection Extubated this am Off pressors HD adequate Good U/O H/H stable and creat ok  PE NAD Awake and alert Abd: incision c/d/i, no infection or peritonitis  A/p Doing well Keep ICU for now Keep NPO and NGT until flatus No complications Continue A/Bs

## 2017-03-19 NOTE — Progress Notes (Signed)
[facility]  Critical Care Medicine Consultation    SYNOPSIS   Patient 83 year old female with acute abdominal pain, status post exploratory laparotomy, reduction of internal hernia and small bowel resection for ischemia  ASSESSMENT/PLAN   Ischemic bowel, status post small bowel resection and reduction of internal hernia from a mesenteric band. Left intubated on mechanical ventilation. Will perform spontaneous awakening/breathing trial this morning and assess for extubation  Hypotension. Resolved, presently off of pressors  Hyperglycemia. On coverage   Name: MALERIE EAKINS MRN: [id number] DOB: May 20, 1925    ADMISSION DATE:  03/18/2017   Last 24 hours: Patient had labile blood pressures, central line was placed, was on pressors (levophed) and subsequently have been weaned. Patient is waking up, will perform spontaneous awakening/breathing trial to evaluate for extubation  PAST MEDICAL HISTORY :  Past Medical History:  Diagnosis Date  . Hypertension 12/25/2013  . Macular degeneration of left eye 01/06/2015  . Osteoporosis    Past Surgical History:  Procedure Laterality Date  . CLOSED REDUCTION PROXIMAL HUMERUS FRACTURE Left 2017  . TOTAL ABDOMINAL HYSTERECTOMY     Prior to Admission medications   Medication Sig Start Date End Date Taking? Authorizing Provider  docusate sodium (COLACE) 100 MG capsule Take 100 mg by mouth daily as needed for mild constipation.   Yes [provider]  ibuprofen (ADVIL,MOTRIN) 200 MG tablet Take 200 mg by mouth as needed.   Yes [provider]  lisinopril (PRINIVIL,ZESTRIL) 10 MG tablet TAKE ONE TABLET BY MOUTH ONCE DAILY 01/10/17  Yes Copland, Karleen Hampshire, MD  Multiple Vitamins-Minerals (ICAPS) CAPS Take 1 capsule by mouth daily.   Yes [provider]   Allergies  Allergen Reactions  . Latex Rash  . Tetanus Toxoid Anaphylaxis    REACTION: anaphylaxis  . Alendronate Sodium     REACTION: passed out    FAMILY HISTORY:    History reviewed. No pertinent family history. SOCIAL HISTORY:  reports that she has quit smoking. She has never used smokeless tobacco. She reports that she does not drink alcohol or use drugs.   VITAL SIGNS: Temp:  [95 F (35 C)-100.2 F (37.9 C)] 99.3 F (37.4 C) (07/08 0700) Pulse Rate:  [57-112] 62 (07/08 0700) Resp:  [14-32] 18 (07/08 0700) BP: (86-212)/(47-96) 116/47 (07/08 0700) SpO2:  [90 %-100 %] 100 % (07/08 0700) Arterial Line BP: (66-173)/(43-80) 113/51 (07/08 0700) FiO2 (%):  [40 %-50 %] 40 % (07/08 0700) Weight:  [67.6 kg (149 lb 0.5 oz)] 67.6 kg (149 lb 0.5 oz) (07/07 1429) HEMODYNAMICS:   VENTILATOR SETTINGS: Vent Mode: PRVC FiO2 (%):  [40 %-50 %] 40 % Set Rate:  [15 bmp] 15 bmp Vt Set:  [450 mL] 450 mL PEEP:  [5 cmH20] 5 cmH20 Plateau Pressure:  [18 cmH20] 18 cmH20 INTAKE / OUTPUT:  Intake/Output Summary (Last 24 hours) at 03/19/17 0754 Last data filed at 03/19/17 0720  Gross per 24 hour  Intake          5481.29 ml  Output              985 ml  Net          4496.29 ml    Physical Examination:   VS: BP (!) 116/47 (BP Location: Right Arm)   Pulse 62   Temp 99.3 F (37.4 C)   Resp 18   Ht 5\' 4"  (1.626 m)   Wt 67.6 kg (149 lb 0.5 oz)   SpO2 100%   BMI 25.58 kg/m   General Appearance:  No distress  Neuro:Patient is arousable, responds HEENT: PERRLA, EOM intact, no ptosis, no other lesions noticed;  Pulmonary: normal breath sounds., diaphragmatic excursion normal. Cardiovascular irregularly irregular rhythm, atrial fibrillation noted on monitor, controlled ventricular response Abdomen: Central dressing noted, soft exam, hypoactive bowel sounds Skin:   warm, no rashes, no ecchymosis  Extremities: normal, no cyanosis, clubbing, no edema, warm with normal capillary refill.    LABS: Reviewed   LABORATORY PANEL:   CBC  Recent Labs Lab 03/19/17 0417  WBC 10.5  HGB 10.7*  HCT 31.6*  PLT 171    Chemistries   Recent Labs Lab  03/18/17 1500 03/19/17 0417  NA 136 136  K 4.0 3.9  CL 107 107  CO2 21* 23  GLUCOSE 165* 259*  BUN 13 17  CREATININE 0.72 0.93  CALCIUM 7.8* 8.3*  MG 1.4* 2.3  PHOS 3.3 2.9  AST 38  --   ALT 15  --   ALKPHOS 61  --   BILITOT 1.0  --      Recent Labs Lab 03/18/17 1442  GLUCAP 146*    Recent Labs Lab 03/18/17 1519  PHART 7.35  PCO2ART 37  PO2ART 150*    Recent Labs Lab 03/18/17 0755 03/18/17 1500  AST 31 38  ALT 10* 15  ALKPHOS 97 61  BILITOT 0.7 1.0  ALBUMIN 4.2 2.9*    Cardiac Enzymes  Recent Labs Lab 03/18/17 0755  TROPONINI <0.03    RADIOLOGY:  Dg Chest Port 1 View  Result Date: 03/19/2017 CLINICAL DATA:  Respiratory failure EXAM: PORTABLE CHEST 1 VIEW COMPARISON:  March 18, 2017 FINDINGS: The NG tube terminates below today's film. The right central line is stable. The ETT is in good position. No pneumothorax. Continued elevation of the right hemidiaphragm. Increasing infiltrate in the left base. The cardiomediastinal silhouette is stable. No other interval changes. IMPRESSION: 1. Support apparatus is in good position. 2. Increasing infiltrate in the left base worrisome for pneumonia. 3. No other interval changes. Electronically Signed   By: Gerome Sam III M.D   On: 03/19/2017 06:47   Dg Chest Port 1 View  Result Date: 03/18/2017 CLINICAL DATA:  Initial evaluation status post central line placement. EXAM: PORTABLE CHEST 1 VIEW COMPARISON:  Prior radiograph from earlier the same day. FINDINGS: Endotracheal tube in place with tip positioned 4.1 cm above the carina. Enteric tube courses in the the abdomen. There has been interval placement of a right IJ approach centra venous catheter with tip overlying the cavoatrial junction. Stable cardiac and mediastinal silhouettes. Lungs are hypoinflated. Bibasilar atelectatic changes. No definite focal infiltrates. No pulmonary edema or pleural effusion. No pneumothorax. Osseous structures unchanged. IMPRESSION: 1.  Interval placement of right IJ approach central venous catheter with tip overlying the cavoatrial junction. No pneumothorax. 2. Tip of endotracheal tube 4.1 cm above the carina. 3. Shallow lung inflation with mild bibasilar atelectasis. Electronically Signed   By: Rise Mu M.D.   On: 03/18/2017 22:10   Dg Chest Port 1 View  Result Date: 03/18/2017 CLINICAL DATA:  Patient status post laparotomy for internal hernia and small bowel resection. EXAM: PORTABLE CHEST 1 VIEW COMPARISON:  Chest radiograph 02/14/2005 FINDINGS: ET tube terminates in the distal trachea. Enteric tube courses inferior to the diaphragm. Monitoring leads overlie the patient. Stable cardiac and mediastinal contours. Aortic atherosclerosis. Elevation right hemidiaphragm. Heterogeneous opacities lung bases bilaterally. Small left pleural effusion. IMPRESSION: ET tube terminates in the distal trachea, consider slight retraction (2 cm). Heterogeneous opacities lung  bases bilaterally favored to represent atelectasis. Infection not excluded. Small left pleural effusion. Electronically Signed   By: Annia Beltrew  Davis M.D.   On: 03/18/2017 15:15   Ct Angio Abd/pel W And/or Wo Contrast  Result Date: 03/18/2017 CLINICAL DATA:  Abdominal pain with nausea and vomiting. EXAM: CT ANGIOGRAPHY ABDOMEN AND PELVIS WITH CONTRAST AND WITHOUT CONTRAST TECHNIQUE: Multidetector CT imaging of the abdomen and pelvis was performed using the standard protocol during bolus administration of intravenous contrast. Multiplanar reconstructed images and MIPs were obtained and reviewed to evaluate the vascular anatomy. CONTRAST:  100 mL Isovue 370 IV COMPARISON:  None. FINDINGS: VASCULAR Aorta: Aortic atherosclerosis noted with mild dilatation of the abdominal aorta up to maximal caliber of 2.9 cm beginning just above the renal arteries and continuing to a juxta-renal position. The distal aorta is not dilated. No evidence of aortic dissection, significant aortic thrombus  or stenosis. Celiac: No significant stenosis. Distal aspect of the celiac trunk demonstrates mild aneurysmal dilatation and measures up to 8 mm in diameter. Distal branches show normal patency and branching anatomy. SMA: Normally patent.  No visualized branch vessel occlusions. Renals: Single right renal artery demonstrates proximal calcified plaque causing non critical stenosis of potentially up to approximately 50%. Single left renal artery demonstrates proximal calcified plaque likely causing significant stenosis of at least 70%. The left kidney does perfuse normally on arterial and venous phases. IMA: Normally patent. Inflow: Bilateral iliac arteries show scattered plaque without evidence of stenosis or aneurysm. Proximal Outflow: Common femoral arteries and femoral bifurcations show normal patency bilaterally. Veins: Venous phase imaging shows normal patency of mesenteric veins, portal vein and splenic vein. The IVC, iliac veins and common femoral veins show no evidence of thrombus. Review of the MIP images confirms the above findings. NON-VASCULAR Lower chest: Bibasilar atelectasis.  No pleural effusions. Hepatobiliary: No focal liver abnormality is seen. No gallstones, gallbladder wall thickening, or biliary dilatation. Pancreas: Unremarkable. No pancreatic ductal dilatation or surrounding inflammatory changes. Spleen: Normal in size without focal abnormality. Adrenals/Urinary Tract: Adrenal glands are unremarkable. Kidneys without renal calculi, focal lesion, or hydronephrosis. The left kidney demonstrates mild cortical atrophy. Bladder is unremarkable. Stomach/Bowel: There is significant inflammation and thickening of small bowel, especially over a segment of the distal jejunum/ proximal ileum extending into the mid to lower pelvis with contiguous loops demonstrating severe edema. These loops are also fluid filled and are suspicious for ischemic/ necrotic bowel. Maximal caliber of inflamed small bowel is  approximately 3 cm. Mid jejunal loops are air filled and moderately dilated. Proximal jejunum and distal ileum are decompressed. The colon is completely decompressed. No free air or focal abscess identified. There is some associated free fluid in the pelvis, paracolic gutters and around the liver. Lymphatic: No enlarged lymph nodes identified. Reproductive: Status post hysterectomy. No adnexal masses. Other: No hernias identified. Musculoskeletal: No acute or significant osseous findings. IMPRESSION: VASCULAR No evidence of significant mesenteric arterial occlusive disease by CTA or mesenteric venous thrombus. 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 Probable significant proximal left renal artery stenosis. NON-VASCULAR Significant edema and dilatation of small bowel at the level of the distal jejunum/proximal ileum extending into the pelvis. These loops are fluid filled and thickened and by imaging are suspicious for ischemic/necrotic small bowel. General surgical consultation recommended. No associated evidence of bowel perforation or focal abscess by CT. These results were called by telephone at the time of interpretation on 03/18/2017 at 10:17 am to Dr. Nita SickleAROLINA VERONESE , who verbally  acknowledged these results. Electronically Signed   By: Irish Lack M.D.   On: 03/18/2017 10:18    Tora Kindred, DO ICU Pager [phone] [facility] Office Number: [phone]  Santiago Glad, M.D.  Billy Fischer, M.D   03/19/2017, 7:54 AM

## 2017-03-19 NOTE — Anesthesia Postprocedure Evaluation (Signed)
Anesthesia Post Note  Patient: [name]  Procedure(s) Performed: Procedure(s) (LRB): EXPLORATORY LAPAROTOMY, SMALL BOWEL RESECTION (N/A)  Patient location during evaluation: ICU Anesthesia Type: General Level of consciousness: awake and alert Pain management: pain level controlled Vital Signs Assessment: post-procedure vital signs reviewed and stable Respiratory status: spontaneous breathing and patient connected to nasal cannula oxygen Cardiovascular status: blood pressure returned to baseline Anesthetic complications: no Comments: Patient extubated this am and VSS... On [place] O2     Last Vitals:  Vitals:   03/19/17 0900 03/19/17 1000  BP: (!) 149/62 (!) 144/70  Pulse: 67 66  Resp: (!) 29 (!) 24  Temp: 37.4 C 37.6 C    Last Pain:  Vitals:   03/19/17 0900  TempSrc:   PainSc: 0-No pain                 Karl Erway

## 2017-03-20 ENCOUNTER — Telehealth: Payer: Self-pay | Admitting: Family Medicine

## 2017-03-20 ENCOUNTER — Telehealth: Payer: Self-pay

## 2017-03-20 DIAGNOSIS — K559 Vascular disorder of intestine, unspecified: Secondary | ICD-10-CM

## 2017-03-20 LAB — GLUCOSE, CAPILLARY
GLUCOSE-CAPILLARY: 72 mg/dL (ref 65–99)
GLUCOSE-CAPILLARY: 83 mg/dL (ref 65–99)
Glucose-Capillary: 98 mg/dL (ref 65–99)
Glucose-Capillary: 57 mg/dL — ABNORMAL LOW (ref 65–99)
Glucose-Capillary: 168 mg/dL — ABNORMAL HIGH (ref 65–99)
Glucose-Capillary: 130 mg/dL — ABNORMAL HIGH (ref 65–99)

## 2017-03-20 LAB — CBC
HCT: 25.9 % — ABNORMAL LOW (ref 35.0–47.0)
Hemoglobin: 8.6 g/dL — ABNORMAL LOW (ref 12.0–16.0)
MCH: 26 pg (ref 26.0–34.0)
MCHC: 33.1 g/dL (ref 32.0–36.0)
MCV: 78.5 fL — ABNORMAL LOW (ref 80.0–100.0)
PLATELETS: 121 10*3/uL — AB (ref 150–440)
RBC: 3.3 MIL/uL — AB (ref 3.80–5.20)
RDW: 16.1 % — ABNORMAL HIGH (ref 11.5–14.5)
WBC: 7 10*3/uL (ref 3.6–11.0)

## 2017-03-20 LAB — COMPREHENSIVE METABOLIC PANEL
ALBUMIN: 2.6 g/dL — AB (ref 3.5–5.0)
ALT: 9 U/L — AB (ref 14–54)
AST: 16 U/L (ref 15–41)
Alkaline Phosphatase: 47 U/L (ref 38–126)
Anion gap: 3 — ABNORMAL LOW (ref 5–15)
BUN: 15 mg/dL (ref 6–20)
CHLORIDE: 109 mmol/L (ref 101–111)
CO2: 26 mmol/L (ref 22–32)
CREATININE: 0.91 mg/dL (ref 0.44–1.00)
Calcium: 8 mg/dL — ABNORMAL LOW (ref 8.9–10.3)
GFR calc Af Amer: >60 mL/min (ref >60)
GFR calc non Af Amer: 54 mL/min — ABNORMAL LOW (ref >60)
Glucose, Bld: 112 mg/dL — ABNORMAL HIGH (ref 65–99)
Potassium: 3.6 mmol/L (ref 3.5–5.1)
SODIUM: 138 mmol/L (ref 135–145)
Total Bilirubin: 0.5 mg/dL (ref 0.3–1.2)
Total Protein: 4.7 g/dL — ABNORMAL LOW (ref 6.5–8.1)

## 2017-03-20 MED ORDER — DEXTROSE 50 % IV SOLN
INTRAVENOUS | Status: AC
  Administered 2017-03-20: 12.5 g via INTRAVENOUS
  Filled 2017-03-20: qty 50

## 2017-03-20 MED ORDER — MORPHINE SULFATE (PF) 2 MG/ML IV SOLN
2.0000 mg | INTRAVENOUS | Status: DC | PRN
  Administered 2017-03-20 – 2017-03-22 (×3): 2 mg via INTRAVENOUS
  Filled 2017-03-20 (×3): qty 1

## 2017-03-20 MED ORDER — DEXTROSE 10 % IV SOLN
INTRAVENOUS | Status: DC
  Administered 2017-03-20: 17:00:00 via INTRAVENOUS

## 2017-03-20 MED ORDER — DEXTROSE 50 % IV SOLN: 12.5000 g | Freq: Once | INTRAVENOUS | Status: DC | PRN

## 2017-03-20 MED ORDER — SODIUM CHLORIDE 0.45 % IV SOLN
INTRAVENOUS | Status: DC
  Administered 2017-03-20: 17:00:00 via INTRAVENOUS

## 2017-03-20 MED ORDER — MORPHINE SULFATE (PF) 2 MG/ML IV SOLN: 1.0000 mg | INTRAVENOUS | Status: DC | PRN

## 2017-03-20 MED ORDER — DEXTROSE 50 % IV SOLN
12.5000 g | Freq: Once | INTRAVENOUS | Status: AC
  Administered 2017-03-20: 12.5 g via INTRAVENOUS

## 2017-03-20 MED ORDER — BLISTEX MEDICATED EX OINT
TOPICAL_OINTMENT | CUTANEOUS | Status: DC | PRN
  Filled 2017-03-20: qty 6.3

## 2017-03-20 MED ORDER — INSULIN ASPART 100 UNIT/ML ~~LOC~~ SOLN
0.0000 [IU] | SUBCUTANEOUS | Status: DC
  Administered 2017-03-20 – 2017-03-22 (×5): 1 [IU] via SUBCUTANEOUS
  Filled 2017-03-20 (×5): qty 1

## 2017-03-20 NOTE — Progress Notes (Signed)
SURGICAL PROGRESS NOTE  Hospital Day(s): 2.   Post op day(s): 2 Days Post-Op.   Interval History: Patient seen and examined, no acute events or new complaints overnight. Patient reports only mild well-controlled peri-incisional abdominal pain, denies flatus, BM, N/V, fever/chills, CP, or SOB. This afternoon, patient developed hypoglycemia, which was treated with adding D10 to patient's maintenance fluids, since which follow-up blood glucose check has improved. Patient does not appear to be receiving insulin, confirmed at bedside with patient's RN.  Review of Systems:  Constitutional: denies fever, chills  HEENT: denies cough or congestion  Respiratory: denies any shortness of breath  Cardiovascular: denies chest pain or palpitations  Gastrointestinal: abdominal pain, N/V, and bowel function as per interval history Genitourinary: denies burning with urination or urinary frequency Musculoskeletal: denies pain, decreased motor or sensation Integumentary: denies any other rashes or skin discolorations except post-surgical abdominal wound Neurological: denies HA or vision/hearing changes   Vital signs in last 24 hours: [min-max] current  Temp:  [98.2 F (36.8 C)-100.4 F (38 C)] 98.6 F (37 C) (07/09 0600) Pulse Rate:  [26-95] 26 (07/09 0600) Resp:  [14-32] 23 (07/09 0600) BP: (96-161)/(41-94) 122/62 (07/09 0600) SpO2:  [89 %-100 %] 89 % (07/09 0600) Arterial Line BP: (79-133)/(56-98) 79/56 (07/08 1200) FiO2 (%):  [30 %] 30 % (07/08 0800)     Height: 5\' 4"  (162.6 cm) Weight: 149 lb 0.5 oz (67.6 kg) BMI (Calculated): 25.6   Intake/Output this shift:  No intake/output data recorded.   Intake/Output last 2 shifts:  @IOLAST2SHIFTS @   Physical Exam:  Constitutional: alert, cooperative and no distress  HENT: normocephalic without obvious abnormality  Eyes: PERRL, EOM's grossly intact and symmetric  Neuro: CN II - XII grossly intact and symmetric without deficit  Respiratory:  breathing non-labored at rest  Cardiovascular: regular rate and sinus rhythm  Gastrointestinal: soft, minimal peri-incisional abdominal tenderness to palpation, non-distended  Musculoskeletal: UE and LE FROM, no edema or wounds, motor and sensation grossly intact, NT   Labs:  CBC Latest Ref Rng & Units 03/20/2017 03/19/2017 03/18/2017  WBC 3.6 - 11.0 K/uL 7.0 10.5 3.1(L)  Hemoglobin 12.0 - 16.0 g/dL 6.0(A) 10.7(L) 12.1  Hematocrit 35.0 - 47.0 % 25.9(L) 31.6(L) 35.7  Platelets 150 - 440 K/uL 121(L) 171 188   CMP Latest Ref Rng & Units 03/20/2017 03/19/2017 03/19/2017  Glucose 65 - 99 mg/dL 540(J) 811(B) 147(W)  BUN 6 - 20 mg/dL 15 16 17   Creatinine 0.44 - 1.00 mg/dL 2.95 6.21 3.08  Sodium 135 - 145 mmol/L 138 139 136  Potassium 3.5 - 5.1 mmol/L 3.6 3.9 3.9  Chloride 101 - 111 mmol/L 109 108 107  CO2 22 - 32 mmol/L 26 25 23   Calcium 8.9 - 10.3 mg/dL 8.0(L) 8.6(L) 8.3(L)  Total Protein 6.5 - 8.1 g/dL 4.7(L) - -  Total Bilirubin 0.3 - 1.2 mg/dL 0.5 - -  Alkaline Phos 38 - 126 U/L 47 - -  AST 15 - 41 U/L 16 - -  ALT 14 - 54 U/L 9(L) - -   Imaging studies: No new pertinent imaging studies available for review   Assessment/Plan: 83 y.o. female doing overall well with anticipated post-operative ileus 2 Days Post-Op s/p open repair of hiatal hernia.   - continue NPO with NG tube for now  - monitor bowel function and abdominal exam  - will likely remove NG tube tomorrow if minimal drainage  - discontinue D10 and resume maintenance IVF once blood glucose normalizes  - agree with transfer  from ICU to med-surg pending bed availability  - ambulation encouraged, incentive spirometry   - DVT prophylaxis   All of the above findings and recommendations were discussed with the patient, patient's family, and the patient's RN, and all of patient's and family's questions were answered to their expressed satisfaction.  -- Scherrie GerlachJason E. Earlene Plateravis, MD, RPVI [place]: [facility] General [facility]  - Partnering for exceptional care. Office: [phone]

## 2017-03-20 NOTE — Progress Notes (Signed)
[facility]  Critical Care Medicine Consultation    SYNOPSIS   Patient 83 year old female with acute abdominal pain, status post exploratory laparotomy, reduction of internal hernia and small bowel resection for ischemia  ASSESSMENT/PLAN   Ischemic bowel, status post small bowel resection and reduction of internal hernia from a mesenteric band. Left intubated on mechanical ventilation.   Patient is extubated and doing well postop Hypotension. Resolved, presently off of pressors  Hyperglycemia. On coverage   Okay to transfer to general medical floor We will sign off at this time  Name: Paula Luna MRN: [id number] DOB: 1924/12/09    ADMISSION DATE:  03/18/2017      VITAL SIGNS: Temp:  [98.2 F (36.8 C)-100.4 F (38 C)] 98.4 F (36.9 C) (07/09 0800) Pulse Rate:  [26-95] 69 (07/09 0800) Resp:  [14-32] 20 (07/09 0800) BP: (96-161)/(41-94) 125/57 (07/09 0800) SpO2:  [89 %-100 %] 100 % (07/09 0800) Arterial Line BP: (79-133)/(56-98) 79/56 (07/08 1200) HEMODYNAMICS:     Intake/Output Summary (Last 24 hours) at 03/20/17 0853 Last data filed at 03/20/17 0600  Gross per 24 hour  Intake          3283.33 ml  Output             2450 ml  Net           833.33 ml    Physical Examination:   VS: BP (!) 125/57   Pulse 69   Temp 98.4 F (36.9 C) (Oral)   Resp 20   Ht 5\' 4"  (1.626 m)   Wt 149 lb 0.5 oz (67.6 kg)   SpO2 100%   BMI 25.58 kg/m   General Appearance: No distress  Neuro:Patient is arousable, responds HEENT: PERRLA, EOM intact, no ptosis, no other lesions noticed;  Pulmonary: normal breath sounds., diaphragmatic excursion normal. Cardiovascular irregularly irregular rhythm, atrial fibrillation noted on monitor, controlled ventricular response Abdomen: Central dressing noted, soft exam, hypoactive bowel sounds Skin:   warm, no rashes, no ecchymosis  Extremities: normal, no cyanosis, clubbing, no edema, warm with normal capillary refill.     LABS: Reviewed   LABORATORY PANEL:   CBC  Recent Labs Lab 03/20/17 0445  WBC 7.0  HGB 8.6*  HCT 25.9*  PLT 121*    Chemistries   Recent Labs Lab 03/19/17 0417 03/19/17 1204 03/20/17 0445  NA 136 139 138  K 3.9 3.9 3.6  CL 107 108 109  CO2 23 25 26   GLUCOSE 259* 152* 112*  BUN 17 16 15   CREATININE 0.93 0.91 0.91  CALCIUM 8.3* 8.6* 8.0*  MG 2.3 2.2  --   PHOS 2.9  --   --   AST  --   --  16  ALT  --   --  9*  ALKPHOS  --   --  47  BILITOT  --   --  0.5     Recent Labs Lab 03/19/17 0818 03/19/17 1104 03/19/17 1558 03/19/17 2058 03/19/17 2345 03/20/17 0723  GLUCAP 195* 168* 133* 135* 130* 98    Recent Labs Lab 03/18/17 1519  PHART 7.35  PCO2ART 37  PO2ART 150*    Recent Labs Lab 03/18/17 0755 03/18/17 1500 03/20/17 0445  AST 31 38 16  ALT 10* 15 9*  ALKPHOS 97 61 47  BILITOT 0.7 1.0 0.5  ALBUMIN 4.2 2.9* 2.6*    Cardiac Enzymes  Recent Labs Lab 03/19/17 1204  TROPONINI 0.03*      Lamondre Wesche Santiago Gladavid Kenwood Rosiak, M.D.  [facility]  Medical Director [facility] Medical Director [facility] Cardio-Pulmonary Department

## 2017-03-20 NOTE — Telephone Encounter (Signed)
PLEASE NOTE: All timestamps contained within this report are represented as [place] Standard Time. CONFIDENTIALTY NOTICE: This fax transmission is intended only for the addressee. It contains information that is legally privileged, confidential or otherwise protected from use or disclosure. If you are not the intended recipient, you are strictly prohibited from reviewing, disclosing, copying using or disseminating any of this information or taking any action in reliance on or regarding this information. If you have received this fax in error, please notify us immediately by telephone so that we can arrange for its return to us. Phone: [phone], Toll-Free: [phone], Fax: [phone] Page: 1 of 1 Call Id: [id number] [facility] - Client TELEPHONE ADVICE RECORD [facility] Patient Name: Paula Luna Gender: Female DOB: 13-Jul-1925 Age: [age] Y 5 M 11 D Return Phone Number: [phone] (Primary), [phone] (Secondary) City[place]State[place]: [place] [id number] Client [facility] - Night Physician Copland, Karleen HampshireSpencer - MD Who Is Calling Patient / Member / Family / Caregiver Call Type Triage / Clinical Caller Name Dewayne Hatchnn Relationship To Patient Daughter Return Phone Number [phone](336) [phone] (Primary) Chief Complaint Vomiting Reason for Call Symptomatic / Request for Health Information Initial Comment Caller states her mom vomited last night and she has lower left abd pain. Nurse Assessment Nurse: Dareen PianoAnderson, RN, Marcelino DusterMichelle Date/Time (Eastern Time): 03/18/2017 5:48:04 AM Confirm and document reason for call. If symptomatic, describe symptoms. ---Caller states her mom vomited last night and she has abdominal pain now above belly button, pt took a stool softener last night. Does the PT have any chronic conditions? (i.e. diabetes, asthma, etc.) ---No Guidelines Guideline  Title Affirmed Question Abdominal Pain - Upper [1] Pain lasts > 10 minutes AND [2] age > 4350 Disp. Time Lamount Cohen(Eastern Time) Disposition Final User 03/18/2017 5:54:12 AM Go to ED Now Yes Dareen PianoAnderson, RN, Marcelino DusterMichelle Referrals [facility] - ED Care Advice Given Per Guideline GO TO ED NOW: You need to be seen in the Emergency Department. Go to the ER at ___________ Hospital. Leave now. Drive carefully. DRIVING: Another adult should drive. Do not delay going to the Emergency Department. If immediate transportation is not available via car or taxi, then the patient should be instructed to call [id number]. * Please bring a list of your current medicines when you go to the Emergency Department (ER). * Confusion occurs * Passes out or becomes too weak to stand * Severe difficulty breathing occurs.

## 2017-03-20 NOTE — Telephone Encounter (Signed)
Per chart review tab [facility].

## 2017-03-20 NOTE — Telephone Encounter (Signed)
I have been following.

## 2017-03-20 NOTE — Progress Notes (Signed)
Initial Nutrition Assessment  DOCUMENTATION CODES:   Not applicable  INTERVENTION:  1. Monitor for diet advancement, provide ONS as indicated  NUTRITION DIAGNOSIS:   Inadequate oral intake related to inability to eat as evidenced by NPO status (Ischemic bowel, NGT to LIWS).  GOAL:   Patient will meet greater than or equal to 90% of their needs  MONITOR:   I & O's, Labs, Diet advancement, Weight trends  REASON FOR ASSESSMENT:   Consult Assessment of nutrition requirement/status  ASSESSMENT:   83 yo female with ischemic bowel s/p SBR & reduction of internal hernia was left intubated on mech ventilation post-op. Now extubated. Off pressors.  She was resting comfortably today.  She reports eating ok PTA, normally consumes cereal and eggs for breakfast, a little sandwich, sometimes chips for lunch, and chicken with lemon or mushrooms and a salad for dinner. Denies recent weight loss. UBW of 143-144# Appetite is ok right now, patient is hungry. No flatus up to this point Nutrition-Focused physical exam completed. Findings are moderate fat depletion, moderate muscle depletion, and mild edema.  NGT in L Nare to LIWS - no output today Access: PIV L forearm  Intake/Output Summary (Last 24 hours) at 03/20/17 1415 Last data filed at 03/20/17 0900  Gross per 24 hour  Intake          2554.17 ml  Output             1880 ml  Net           674.17 ml    Labs and medications reviewed: Novolog 0-9 Units, Protonix 40mg  IV D5 1/2 NS @ 13825mL/hr --> 510 calories  Diet Order:  Diet NPO time specified  Skin:  Wound (see comment) (Surgical incision to abdomen)  Last BM:  PTA  Height:   Ht Readings from Last 1 Encounters:  03/18/17 5\' 4"  (1.626 m)    Weight:   Wt Readings from Last 1 Encounters:  03/18/17 149 lb 0.5 oz (67.6 kg)    Ideal Body Weight:  54.54 kg  BMI:  Body mass index is 25.58 kg/m.  Estimated Nutritional Needs:   Kcal:  1610-96041303-1414 (MSJ x1.2-1.3)    Protein:  88-101 grams (1.3-1.5g/kg)  Fluid:  >/= 1.5L  EDUCATION NEEDS:   No education needs identified at this time  Dionne AnoWilliam M. Kamaljit Hizer, MS, RD LDN Inpatient Clinical Dietitian Pager [phone]

## 2017-03-20 NOTE — Progress Notes (Signed)
CH responded to an OR for prayer. Pt is very pleasant and talkative. Pt stated that she just wanted a blessing for today. CH offered a prayer as a blessing. Pt appreciated the prayer and visit. CH is blessed as well from the visit.  CH is available for follow up as needed.    03/20/17 1000  Clinical Encounter Type  Visited With Patient;Health care provider  Visit Type Initial;Spiritual support;Critical Care  Referral From Nurse  Consult/Referral To Chaplain  Spiritual Encounters  Spiritual Needs Prayer

## 2017-03-20 NOTE — Telephone Encounter (Signed)
Pt's daughter called to inform Dr. Patsy Lageropland that pt has been placed in intensive care at [facility]. Just wanted to make him aware.

## 2017-03-20 NOTE — Progress Notes (Signed)
eLink Physician-Brief Progress Note Patient Name: Paula CornsRoxanne D Luna DOB: 04/02/1925 MRN: [id number]   Date of Service  03/20/2017  HPI/Events of Note  Hypoglycemic episodes X 2 today - Blood glucose = 57 and 72. Currently on D5 0.45 NaCl IV fluid at 125 mL/hour.   eICU Interventions  Will order: 1. D/C D5 0.45 NaCl IV fluid. 2. D10W to run IV at 75 mL/hour. 2. 0.45 NaCl to run IV at 50 mL/hour.      Intervention Category Major Interventions: Other:  [name],[name] 03/20/2017, 4:57 PM

## 2017-03-21 LAB — TRIGLYCERIDES: Triglycerides: 75 mg/dL (ref <150)

## 2017-03-21 LAB — BASIC METABOLIC PANEL
ANION GAP: 7 (ref 5–15)
BUN: 12 mg/dL (ref 6–20)
CO2: 26 mmol/L (ref 22–32)
Calcium: 8.3 mg/dL — ABNORMAL LOW (ref 8.9–10.3)
Chloride: 105 mmol/L (ref 101–111)
Creatinine, Ser: 0.83 mg/dL (ref 0.44–1.00)
Glucose, Bld: 116 mg/dL — ABNORMAL HIGH (ref 65–99)
POTASSIUM: 3.2 mmol/L — AB (ref 3.5–5.1)
SODIUM: 138 mmol/L (ref 135–145)

## 2017-03-21 LAB — GLUCOSE, CAPILLARY
GLUCOSE-CAPILLARY: 106 mg/dL — AB (ref 65–99)
GLUCOSE-CAPILLARY: 90 mg/dL (ref 65–99)
GLUCOSE-CAPILLARY: 148 mg/dL — AB (ref 65–99)
Glucose-Capillary: 101 mg/dL — ABNORMAL HIGH (ref 65–99)
Glucose-Capillary: 149 mg/dL — ABNORMAL HIGH (ref 65–99)

## 2017-03-21 LAB — HEMOGLOBIN A1C
Hgb A1c MFr Bld: 5.5 % (ref 4.8–5.6)
Mean Plasma Glucose: 111 mg/dL

## 2017-03-21 MED ORDER — SODIUM CHLORIDE 4 MEQ/ML IV SOLN
INTRAVENOUS | Status: DC
  Administered 2017-03-21: 13:00:00 via INTRAVENOUS
  Filled 2017-03-21 (×2): qty 1000

## 2017-03-21 MED ORDER — BOOST / RESOURCE BREEZE PO LIQD
1.0000 | Freq: Three times a day (TID) | ORAL | Status: DC
Start: 2017-03-21 — End: 2017-03-23
  Administered 2017-03-21 – 2017-03-22 (×5): 1 via ORAL

## 2017-03-21 MED ORDER — SODIUM CHLORIDE 4 MEQ/ML IV SOLN
INTRAVENOUS | Status: DC
  Administered 2017-03-21 – 2017-03-23 (×3): via INTRAVENOUS
  Filled 2017-03-21 (×2): qty 1000

## 2017-03-21 MED ORDER — CHLORHEXIDINE GLUCONATE 0.12 % MT SOLN
OROMUCOSAL | Status: AC
  Administered 2017-03-21: 15 mL
  Filled 2017-03-21: qty 15

## 2017-03-21 NOTE — Progress Notes (Signed)
SURGICAL PROGRESS NOTE  Hospital Day(s): 3.   Post op day(s): 3 Days Post-Op.   Interval History: Patient seen and examined, no acute events or new complaints overnight. Patient reports passing small amount of flatus with minimal NG tube drainage and minimal peri-incisional abdominal pain, denies N/V, fever/chills, CP, or SOB.  Review of Systems:  Constitutional: denies fever, chills  HEENT: denies cough or congestion  Respiratory: denies any shortness of breath  Cardiovascular: denies chest pain or palpitations  Gastrointestinal: abdominal pain, N/V, and bowel function as per interval history Genitourinary: denies burning with urination or urinary frequency Musculoskeletal: denies pain, decreased motor or sensation Integumentary: denies any other rashes or skin discolorations except post-surgical abdominal wound Neurological: denies HA or vision/hearing changes   Vital signs in last 24 hours: [min-max] current  Temp:  [97.9 F (36.6 C)-98.4 F (36.9 C)] 98.2 F (36.8 C) (07/10 0500) Pulse Rate:  [65-95] 84 (07/10 0600) Resp:  [15-32] 15 (07/10 0600) BP: (102-172)/(49-87) 172/67 (07/10 0600) SpO2:  [91 %-100 %] 93 % (07/10 0600)     Height: 5\' 4"  (162.6 cm) Weight: 149 lb 0.5 oz (67.6 kg) BMI (Calculated): 25.6   Intake/Output this shift:  No intake/output data recorded.   Intake/Output last 2 shifts:  @IOLAST2SHIFTS @   Physical Exam:  Constitutional: alert, cooperative and no distress  HENT: normocephalic without obvious abnormality  Eyes: PERRL, EOM's grossly intact and symmetric  Neuro: CN II - XII grossly intact and symmetric without deficit  Respiratory: breathing non-labored at rest  Cardiovascular: regular rate and sinus rhythm  Gastrointestinal: soft, non-tender, and non-distended with abdominal incision well-approximated without erythema or drainage Musculoskeletal: UE and LE FROM, no edema or wounds, motor and sensation grossly intact, NT   Labs:  CBC  Latest Ref Rng & Units 03/20/2017 03/19/2017 03/18/2017  WBC 3.6 - 11.0 K/uL 7.0 10.5 3.1(L)  Hemoglobin 12.0 - 16.0 g/dL 1.6(X8.6(L) 10.7(L) 12.1  Hematocrit 35.0 - 47.0 % 25.9(L) 31.6(L) 35.7  Platelets 150 - 440 K/uL 121(L) 171 188   CMP Latest Ref Rng & Units 03/20/2017 03/19/2017 03/19/2017  Glucose 65 - 99 mg/dL 096(E112(H) 454(U152(H) 981(X259(H)  BUN 6 - 20 mg/dL 15 16 17   Creatinine 0.44 - 1.00 mg/dL 9.140.91 7.820.91 9.560.93  Sodium 135 - 145 mmol/L 138 139 136  Potassium 3.5 - 5.1 mmol/L 3.6 3.9 3.9  Chloride 101 - 111 mmol/L 109 108 107  CO2 22 - 32 mmol/L 26 25 23   Calcium 8.9 - 10.3 mg/dL 8.0(L) 8.6(L) 8.3(L)  Total Protein 6.5 - 8.1 g/dL 4.7(L) - -  Total Bilirubin 0.3 - 1.2 mg/dL 0.5 - -  Alkaline Phos 38 - 126 U/L 47 - -  AST 15 - 41 U/L 16 - -  ALT 14 - 54 U/L 9(L) - -   Imaging studies: No new pertinent imaging studies   Assessment/Plan: 83 y.o. female doing overall well with anticipated post-operative ileus 2 Days Post-Op s/p open repair of hiatal hernia.              - NG tube removed, clear liquids diet ordered             - continue to monitor bowel function and abdominal exam             - rate of D10 with 40 mEq KCl decreased, will continue to monitor blood glucose             - ambulation encouraged, incentive spirometry              -  DVT prophylaxis   All of the above findings and recommendations were discussed with the patient and the patient's RN, and all of patient's questions were answered to her expressed satisfaction.  -- Scherrie Gerlach Earlene Plater, MD, RPVI [place]: [facility] - Partnering for exceptional care. Office: [phone]

## 2017-03-21 NOTE — Progress Notes (Addendum)
Nutrition Follow Up Exam   DOCUMENTATION CODES:   Not applicable  INTERVENTION:   Boost Breeze po TID, each supplement provides 250 kcal and 9 grams of protein   RD will order Ensure when diet advanced   NUTRITION DIAGNOSIS:   Inadequate oral intake related to inability to eat as evidenced by NPO status (Ischemic bowel, NGT to LIWS).  Pt advanced to clear liquid diet  GOAL:   Patient will meet greater than or equal to 90% of their needs  MONITOR:   PO intake, Supplement acceptance, Labs, Weight trends  ASSESSMENT:   83 yo female with ischemic bowel s/p SBR & reduction of internal hernia was left intubated on mech ventilation post-op. Now extubated. Off pressors.   Pt initiated on clear liquid diet today. RD will order Boost Breeze to help pt meet estimated protein needs. Will change to Ensure when diet advanced. No new weight on pt since admit. RD will continue to monitor for nutritional needs. Pt with hypokalemia; monitor and supplement as needed per MD discretion.   Medications reviewed and include: lovenox, insulin, protonix, NaCl w/ KCl and dextrose, zosyn  Labs reviewed: K 3.2(L), Ca 8.3(L) Hgb 8.6(L), Hct 25.9(L)  Diet Order:  Diet clear liquid Room service appropriate? Yes; Fluid consistency: Thin  Skin:  Wound (see comment) (Surgical incision to abdomen)  Last BM:  PTA  Height:   Ht Readings from Last 1 Encounters:  03/18/17 5\' 4"  (1.626 m)    Weight:   Wt Readings from Last 1 Encounters:  03/18/17 149 lb 0.5 oz (67.6 kg)    Ideal Body Weight:  54.54 kg  BMI:  Body mass index is 25.58 kg/m.  Estimated Nutritional Needs:   Kcal:  1400-1600kcal/day   Protein:  68-81g/day   Fluid:  >1.4L/day   EDUCATION NEEDS:   No education needs identified at this time  Betsey Holidayasey Abanoub Hanken MS, RD, LDN Pager #[phone] After Hours Pager: [phone]

## 2017-03-21 NOTE — Evaluation (Signed)
Physical Therapy Evaluation Patient Details Name: JEARLENE BRIDWELL MRN: [id number] DOB: [birth year] Today's Date: 03/21/2017   History of Present Illness  presented to ER secondary to abdominal pain, nausea/vomiting; admitted with ischemic bowel, status post exploratory laparotomy with reduction of internal hernia, small bowel resection.  Hospital stay complicated by post-op ileus, hypotension requiring pressors (now weaned).  Transferred to floor and on RA.  Clinical Impression  Upon evaluation, patient alert and oriented; follows all commands and demonstrates good effort throughout session.  Bilat UE/LE strength and ROM grossly symmetrical and WFL; minimal/pain reported with mobility.  Demonstrates ability to complete bed mobility, sit/stand, basic transfers and short-distance gait (5') with RW, min assist.  Broad BOS with short, choppy steps; min assist for safety/balance throughout.  Fatigued with minimal activity; declined further gait distance at this time.  Will continue to progress mobility/activity tolerance as appropriate. Would benefit from skilled PT to address above deficits and promote optimal return to PLOF; Recommend transition to HHPT and 24/7 supervision upon discharge from acute hospitalization.     Follow Up Recommendations Home health PT;Supervision/Assistance - 24 hour    Equipment Recommendations  Rolling walker with 5" wheels;3in1 (PT)    Recommendations for Other Services       Precautions / Restrictions Precautions Precautions: Fall Restrictions Weight Bearing Restrictions: No      Mobility  Bed Mobility Overal bed mobility: Needs Assistance Bed Mobility: Supine to Sit     Supine to sit: Min assist     General bed mobility comments: mild assist for truncal elevation; heavy use of bedrails  Transfers Overall transfer level: Needs assistance Equipment used: Rolling walker (2 wheeled) Transfers: Sit to/from Stand Sit to Stand: Min assist          General transfer comment: cuing for hand placement; decreased speed of movement transition, decreased LE power  Ambulation/Gait Ambulation/Gait assistance: Min assist Ambulation Distance (Feet): 5 Feet Assistive device: Rolling walker (2 wheeled)       General Gait Details: forward flexed posture with broad BOS, short choppy steps with limited balance reactions; mod WBing/reliance on RW.  Recommend continued use of RW and +1 assist at all times.  Patient declined additioanl distance due to fatigue this date.  Stairs            Wheelchair Mobility    Modified Rankin (Stroke Patients Only)       Balance Overall balance assessment: Needs assistance Sitting-balance support: No upper extremity supported;Feet supported Sitting balance-Leahy Scale: Good     Standing balance support: Bilateral upper extremity supported Standing balance-Leahy Scale: Fair                               Pertinent Vitals/Pain Pain Assessment: No/denies pain    Home Living Family/patient expects to be discharged to:: Private residence Living Arrangements: Alone Available Help at Discharge: Family;Available 24 hours/day Type of Home: House Home Access: Stairs to enter Entrance Stairs-Rails: Right Entrance Stairs-Number of Steps: 3 Home Layout: Two level;Bed/bath upstairs;Able to live on main level with bedroom/bathroom Home Equipment: Gilmer Mor - single point      Prior Function Level of Independence: Independent         Comments: Indep with ADLs, household and community mobility; intermittent use of RW for community distances at times.  Denies fall history.     Hand Dominance        Extremity/Trunk Assessment   Upper Extremity Assessment Upper Extremity Assessment:  Overall [facility] for tasks assessed    Lower Extremity Assessment Lower Extremity Assessment: Overall WFL for tasks assessed (grossly at least 4/5 throughout)       Communication   Communication: No  difficulties  Cognition Arousal/Alertness: Awake/alert Behavior During Therapy: WFL for tasks assessed/performed Overall Cognitive Status: Within Functional Limits for tasks assessed                                        General Comments      Exercises Other Exercises Other Exercises: Toilet transfer, SPT with RW, min assist; [facility] from [facility] with [facility]W, min assist-cuing for hand placement; standing balance with RW for hygiene, min assist.  Mild tendancy towards posterior LOB, min assist for safety/balance.   Assessment/Plan    PT Assessment Patient needs continued PT services  PT Problem List Decreased strength;Decreased range of motion;Decreased activity tolerance;Decreased balance;Decreased mobility;Decreased coordination;Decreased cognition;Decreased knowledge of precautions;Cardiopulmonary status limiting activity;Decreased skin integrity;Decreased safety awareness       PT Treatment Interventions DME instruction;Gait training;Stair training;Functional mobility training;Therapeutic activities;Therapeutic exercise;Balance training;Patient/family education    PT Goals (Current goals can be found in the Care Plan section)  Acute Rehab PT Goals Patient Stated Goal: to go to the bathroom PT Goal Formulation: With patient/family Time For Goal Achievement: 04/04/17 Potential to Achieve Goals: Good    Frequency Min 2X/week   Barriers to discharge Decreased caregiver support      Co-evaluation               AM-PAC PT "6 Clicks" Daily Activity  Outcome Measure Difficulty turning over in bed (including adjusting bedclothes, sheets and blankets)?: Total Difficulty moving from lying on back to sitting on the side of the bed? : Total Difficulty sitting down on and standing up from a chair with arms (e.g., wheelchair, bedside commode, etc,.)?: Total Help needed moving to and from a bed to chair (including a wheelchair)?: A Little Help needed walking in hospital  room?: A Little Help needed climbing 3-5 steps with a railing? : A Little 6 Click Score: 12    End of Session Equipment Utilized During Treatment: Gait belt Activity Tolerance: Patient tolerated treatment well Patient left: in chair;with call bell/phone within reach;with chair alarm set;with family/visitor present Nurse Communication: Mobility status PT Visit Diagnosis: Muscle weakness (generalized) (M62.81);Difficulty in walking, not elsewhere classified (R26.2)    Time: [id number] PT Time Calculation (min) (ACUTE ONLY): 25 min   Charges:   PT Evaluation $PT Eval Low Complexity: 1 Procedure PT Treatments $Therapeutic Activity: 8-22 mins   PT G Codes:        Annais Crafts H. Manson PasseyBrown, PT, DPT, NCS 03/21/17, 4:10 PM [phone]

## 2017-03-21 NOTE — Progress Notes (Signed)
[facility] FAMILY AT BEDSIDE during transfer. VS wnl, transferred on telemetry by CNA.

## 2017-03-22 LAB — BASIC METABOLIC PANEL
ANION GAP: 6 (ref 5–15)
BUN: 8 mg/dL (ref 6–20)
CO2: 26 mmol/L (ref 22–32)
Calcium: 8.2 mg/dL — ABNORMAL LOW (ref 8.9–10.3)
Chloride: 109 mmol/L (ref 101–111)
Creatinine, Ser: 0.55 mg/dL (ref 0.44–1.00)
Glucose, Bld: 132 mg/dL — ABNORMAL HIGH (ref 65–99)
POTASSIUM: 3.5 mmol/L (ref 3.5–5.1)
SODIUM: 141 mmol/L (ref 135–145)

## 2017-03-22 LAB — SURGICAL PATHOLOGY

## 2017-03-22 LAB — GLUCOSE, CAPILLARY
GLUCOSE-CAPILLARY: 124 mg/dL — AB (ref 65–99)
GLUCOSE-CAPILLARY: 129 mg/dL — AB (ref 65–99)
Glucose-Capillary: 125 mg/dL — ABNORMAL HIGH (ref 65–99)
Glucose-Capillary: 111 mg/dL — ABNORMAL HIGH (ref 65–99)
Glucose-Capillary: 122 mg/dL — ABNORMAL HIGH (ref 65–99)
Glucose-Capillary: 126 mg/dL — ABNORMAL HIGH (ref 65–99)

## 2017-03-22 MED ORDER — HYDROCODONE-ACETAMINOPHEN 5-325 MG PO TABS: 1.0000 | ORAL_TABLET | Freq: Four times a day (QID) | ORAL | Status: DC | PRN

## 2017-03-22 MED ORDER — MORPHINE SULFATE (PF) 2 MG/ML IV SOLN: 2.0000 mg | INTRAVENOUS | Status: DC | PRN

## 2017-03-22 MED ORDER — ACETAMINOPHEN 325 MG PO TABS
650.0000 mg | ORAL_TABLET | Freq: Four times a day (QID) | ORAL | Status: DC | PRN
  Administered 2017-03-22: 650 mg via ORAL
  Filled 2017-03-22: qty 2

## 2017-03-22 MED ORDER — POTASSIUM CHLORIDE CRYS ER 20 MEQ PO TBCR
40.0000 meq | EXTENDED_RELEASE_TABLET | Freq: Once | ORAL | Status: AC
  Administered 2017-03-22: 40 meq via ORAL
  Filled 2017-03-22: qty 2

## 2017-03-22 NOTE — Progress Notes (Addendum)
Physical Therapy Treatment Patient Details Name: Paula Luna MRN: [id number] DOB: 11/27/1924 Today's Date: 03/22/2017    History of Present Illness presented to ER secondary to abdominal pain, nausea/vomiting; admitted with ischemic bowel, status post exploratory laparotomy with reduction of internal hernia, small bowel resection.  Hospital stay complicated by post-op ileus, hypotension requiring pressors (now weaned).  Transferred to floor and on RA.    PT Comments    Marked improvement in gait distance and overall activity tolerance this date, complete gait around nursing station with no greater than cga/min assist from therapist.  Frequent cuing for full postural extension (as able within tolerable ROM) and walker position with all mobility efforts. Patient with excellent motivation/participation; anticipate consistent progress between sessions. Will continues to progress with LE strength, core stability, standing balance; will plan to assess stairs prior to discharge (next session?) as appropriate.  Follow Up Recommendations  Home health PT;Supervision/Assistance - 24 hour     Equipment Recommendations  Rolling walker with 5" wheels;3in1 (PT)    Recommendations for Other Services       Precautions / Restrictions Precautions Precautions: Fall Restrictions Weight Bearing Restrictions: No    Mobility  Bed Mobility               General bed mobility comments: seated in recliner beginning/end of treatment session  Transfers Overall transfer level: Needs assistance Equipment used: Rolling walker (2 wheeled) Transfers: Sit to/from Stand Sit to Stand: Min guard         General transfer comment: cuing for hand placement; decreased speed of movement transition, decreased LE power  Ambulation/Gait Ambulation/Gait assistance: Min guard Ambulation Distance (Feet): 220 Feet Assistive device: Rolling walker (2 wheeled)       General Gait Details: slow, but steady,  gait performance with improved step height/length.  Cuing for walker position (tends to maintain arms length anterior to patient) and postural extenion.   Stairs            Wheelchair Mobility    Modified Rankin (Stroke Patients Only)       Balance Overall balance assessment: Needs assistance Sitting-balance support: No upper extremity supported;Feet supported Sitting balance-Leahy Scale: Good     Standing balance support: Bilateral upper extremity supported Standing balance-Leahy Scale: Fair                              Cognition Arousal/Alertness: Awake/alert Behavior During Therapy: WFL for tasks assessed/performed Overall Cognitive Status: Within Functional Limits for tasks assessed                                        Exercises Other Exercises Other Exercises: Sit/stand from recliner with RW, cga/min assist, with progressively less UE support to place increased demand on bilat LEs.  Min assist from therapist to complete lift off with less UE support.  Emphasis on full postural extension and static standing balance (unsupported) when upright. Other Exercises: Toilet transfer, ambulatory with RW, cga/close sup; sit/satnd from standard toilet with grab bar, cga    General Comments        Pertinent Vitals/Pain Pain Assessment: No/denies pain    Home Living                      Prior Function            PT  Goals (current goals can now be found in the care plan section) Acute Rehab PT Goals Patient Stated Goal: to go to the bathroom PT Goal Formulation: With patient/family Time For Goal Achievement: 04/04/17 Potential to Achieve Goals: Good Progress towards PT goals: Progressing toward goals    Frequency    Min 2X/week      PT Plan Current plan remains appropriate    Co-evaluation              AM-PAC PT "6 Clicks" Daily Activity  Outcome Measure  Difficulty turning over in bed (including adjusting  bedclothes, sheets and blankets)?: A Little Difficulty moving from lying on back to sitting on the side of the bed? : A Little Difficulty sitting down on and standing up from a chair with arms (e.g., wheelchair, bedside commode, etc,.)?: A Little Help needed moving to and from a bed to chair (including a wheelchair)?: A Little Help needed walking in hospital room?: A Little Help needed climbing 3-5 steps with a railing? : A Little 6 Click Score: 18    End of Session Equipment Utilized During Treatment: Gait belt Activity Tolerance: Patient tolerated treatment well Patient left: in chair;with call bell/phone within reach;with chair alarm set;with family/visitor present Nurse Communication: Mobility status PT Visit Diagnosis: Muscle weakness (generalized) (M62.81);Difficulty in walking, not elsewhere classified (R26.2)     Time: [id number] PT Time Calculation (min) (ACUTE ONLY): 40 min  Charges:  $Gait Training: 8-22 mins $Therapeutic Exercise: 8-22 mins $Therapeutic Activity: 8-22 mins                    G Codes:         Minnetta Sandora H. Manson PasseyBrown, PT, DPT, NCS 03/22/17, 11:02 PM [phone]  Addendum: time corrected to reflect end-time of session.  Johan Antonacci H. Manson PasseyBrown, PT, DPT, NCS 03/22/17, 11:05 PM [phone]

## 2017-03-22 NOTE — Telephone Encounter (Signed)
Patient's daughter,[name],called.  She'd like to know if Dr.Copland's aware of what's going on at the hospital and how he feels about her prognosis. I let her know Dr.Copland has been following.  Patient keeps asking daughter if Dr.Copland knows what's going on. Daughter would appreciate a call back.

## 2017-03-22 NOTE — Progress Notes (Signed)
SURGICAL PROGRESS NOTE  Hospital Day(s): 4.   Post op day(s): 4 Days Post-Op.   Interval History: Patient seen and examined, no acute events or new complaints overnight. Patient reports much +flatus and tolerating clear liquids, denies abdominal pain, fever/chills, CP, or SOB.  Review of Systems:  Constitutional: denies fever, chills  HEENT: denies cough or congestion  Respiratory: denies any shortness of breath  Cardiovascular: denies chest pain or palpitations  Gastrointestinal: abdominal pain, N/V, and bowel function as per interval history Genitourinary: denies burning with urination or urinary frequency Musculoskeletal: denies pain, decreased motor or sensation Integumentary: denies any other rashes or skin discolorations except post-surgical abdominal wound Neurological: denies HA or vision/hearing changes   Vital signs in last 24 hours: [min-max] current  Temp:  [98 F (36.7 C)-98.5 F (36.9 C)] 98 F (36.7 C) (07/11 1300) Pulse Rate:  [80-89] 88 (07/11 1300) Resp:  [17-20] 18 (07/11 1300) BP: (145-188)/(74-89) 155/75 (07/11 1300) SpO2:  [97 %-98 %] 98 % (07/11 1300)     Height: 5\' 4"  (162.6 cm) Weight: 149 lb 0.5 oz (67.6 kg) BMI (Calculated): 25.6   Intake/Output this shift:  Total I/O In: 1182 [P.O.:760; I.V.:380; IV Piggyback:42] Out: 1650 [Urine:1650]   Intake/Output last 2 shifts:  @IOLAST2SHIFTS @   Physical Exam:  Constitutional: alert, cooperative and no distress  HENT: normocephalic without obvious abnormality  Eyes: PERRL, EOM's grossly intact and symmetric  Neuro: CN II - XII grossly intact and symmetric without deficit  Respiratory: breathing non-labored at rest  Cardiovascular: regular rate and sinus rhythm  Gastrointestinal: soft, non-tender, and non-distended with incision well-approximated without surrounding erythema or drainage Musculoskeletal: UE and LE FROM, no edema or wounds, motor and sensation grossly intact, NT   Labs:  CBC Latest  Ref Rng & Units 03/20/2017 03/19/2017 03/18/2017  WBC 3.6 - 11.0 K/uL 7.0 10.5 3.1(L)  Hemoglobin 12.0 - 16.0 g/dL 1.6(X8.6(L) 10.7(L) 12.1  Hematocrit 35.0 - 47.0 % 25.9(L) 31.6(L) 35.7  Platelets 150 - 440 K/uL 121(L) 171 188   CMP Latest Ref Rng & Units 03/22/2017 03/21/2017 03/20/2017  Glucose 65 - 99 mg/dL 096(E132(H) 454(U116(H) 981(X112(H)  BUN 6 - 20 mg/dL 8 12 15   Creatinine 0.44 - 1.00 mg/dL 9.140.55 7.820.83 9.560.91  Sodium 135 - 145 mmol/L 141 138 138  Potassium 3.5 - 5.1 mmol/L 3.5 3.2(L) 3.6  Chloride 101 - 111 mmol/L 109 105 109  CO2 22 - 32 mmol/L 26 26 26   Calcium 8.9 - 10.3 mg/dL 8.2(L) 8.3(L) 8.0(L)  Total Protein 6.5 - 8.1 g/dL - - 4.7(L)  Total Bilirubin 0.3 - 1.2 mg/dL - - 0.5  Alkaline Phos 38 - 126 U/L - - 47  AST 15 - 41 U/L - - 16  ALT 14 - 54 U/L - - 9(L)   Imaging studies: No new pertinent imaging studies   Assessment/Plan: 83 y.o.femaledoing overall well with anticipated post-operative ileus 4 Days Post-Ops/p open repair of hiatal hernia.  - advance to soft diet - continue to monitor bowel function and abdominal exam - rate of D10 with 40 mEq KCl decreased, will check am BMP for blood glucose - ambulation encouraged, incentive spirometry  - anticipate discharge tomorrow - DVT prophylaxis  All of the above findings and recommendations were discussed with the patient and the patient's family, and all of patient's questions were answered to her expressed satisfaction.  -- Scherrie GerlachJason E. Earlene Plateravis, MD, RPVI [place]: [facility] General [facility] - Partnering for exceptional care. Office: [phone]

## 2017-03-22 NOTE — Progress Notes (Signed)
Per Dr. Earlene Plateravis do not give 1 unit of insulin per sliding scale and MD to discontinue order for fingersticks and ACHS insulin. Verified that pt is not a diabetic

## 2017-03-22 NOTE — Telephone Encounter (Signed)
I spoke with them regarding her condition, reviewed operative notes and discussed with them.

## 2017-03-23 ENCOUNTER — Telehealth: Payer: Self-pay

## 2017-03-23 LAB — CULTURE, BLOOD (ROUTINE X 2)
CULTURE: NO GROWTH
Culture: NO GROWTH
SPECIAL REQUESTS: ADEQUATE
Special Requests: ADEQUATE

## 2017-03-23 LAB — BASIC METABOLIC PANEL
Anion gap: 4 — ABNORMAL LOW (ref 5–15)
BUN: 8 mg/dL (ref 6–20)
CALCIUM: 8.4 mg/dL — AB (ref 8.9–10.3)
CHLORIDE: 110 mmol/L (ref 101–111)
CO2: 27 mmol/L (ref 22–32)
CREATININE: 0.64 mg/dL (ref 0.44–1.00)
Glucose, Bld: 103 mg/dL — ABNORMAL HIGH (ref 65–99)
Potassium: 4.1 mmol/L (ref 3.5–5.1)
Sodium: 141 mmol/L (ref 135–145)

## 2017-03-23 MED ORDER — ENSURE ENLIVE PO LIQD
237.0000 mL | Freq: Two times a day (BID) | ORAL | Status: DC
  Administered 2017-03-23: 237 mL via ORAL

## 2017-03-23 NOTE — Progress Notes (Signed)
Pt A and O x 4. VSS. Pt tolerating diet well. No complaints of pain or nausea. IV removed intact, no new prescriptions given. Pt and daughter voiced understanding of discharge instructions with no further questions. Pt discharged via wheelchair with axillary.

## 2017-03-23 NOTE — Telephone Encounter (Signed)
Disability forms came in for this patient and has been placed on your desk.

## 2017-03-23 NOTE — Progress Notes (Signed)
Per Dr. Earlene Plateravis okay to discontinue telemetry

## 2017-03-23 NOTE — Discharge Instructions (Signed)
In addition to included general post-operative instructions for Small Bowel Resection,  Diet: Resume home heart healthy diet.   Activity: No heavy lifting >20 pounds (children, pets, laundry, garbage) or strenuous activity until follow-up, but light activity and walking are encouraged. Do not drive or drink alcohol if taking narcotic pain medications.  Wound care: Okay to shower/get incision wet with soapy water and pat dry (do not rub incisions), but no baths or submerging incision underwater until follow-up.   Medications: Resume all home medications. For mild to moderate pain: acetaminophen (Tylenol) or ibuprofen (if no kidney disease). Combining Tylenol with alcohol can substantially increase your risk of causing liver disease. Narcotic pain medications, if prescribed, can be used for severe pain, though may cause nausea, constipation, and drowsiness. Do not combine Tylenol and Percocet within a 6 hour period as Percocet contains Tylenol. If you do not need the narcotic pain medication, you do not need to fill the prescription.  Call office [phone]) at any time if any questions, worsening pain, fevers/chills, bleeding, drainage from incision site, or other concerns.

## 2017-03-24 ENCOUNTER — Telehealth: Payer: Self-pay

## 2017-03-24 NOTE — Telephone Encounter (Signed)
Post-op call made to patient at this time. Spoke with Roger ShelterAnn Castagna (patient's daughter). Post-op interview questions below.  1. How are you feeling? Feeling fine at this trime  2. Is your pain controlled? Yes  3. What are you doing for the pain? Not taking anything at the moment  4. Are you having any Nausea or Vomiting? None  5. Are you having any Fever or Chills? none  6. Are you having any Constipation or Diarrhea? Had a bowel movement this morning after eating raisin bran cereal for breakfast  7. Is there any Swelling or Bruising you are concerned about? none  8. Do you have any questions or concerns at this time? Patients daughter state that she has been having trouble with assisting her mother with personal care as well as physical therapy such as walking. Patient's daughter stated that she was told to reach out to the Primary Care Provider for at home health services. She sis as told and was informed that [facility] her when they are available to come out however they are currently short staff. She wanted to know if we had another home health service that could come out to help her mother while in the recovery process? I told her that I would forward over her concern to Marylene Landngela to see if she could get help from [facility].     Discussion: Reminded patients daughter of her appointment on 7/25 at 3:30 with Dr. Everlene FarrierPabon.

## 2017-03-24 NOTE — Telephone Encounter (Signed)
Patient called to pay her $25 fee. Therefore, I faxed her disability form.

## 2017-03-24 NOTE — Addendum Note (Signed)
Addended by: Hannah BeatOPLAND, Lavada Langsam on: 03/24/2017 11:25 AM   Modules accepted: Orders

## 2017-03-24 NOTE — Telephone Encounter (Signed)
Paula ShelterAnn Castagna (DPR signed) left v/m that pt was discharged home on 03/23/17;Dewayne Hatchnn is not able to do everything for pt; [name] cannot bathe pt and request some help in the home. [name] request cb.

## 2017-03-24 NOTE — Telephone Encounter (Signed)
I have set up urgent orders for home health RN and PT assessment.

## 2017-03-24 NOTE — Telephone Encounter (Signed)
Disability Form was filled out and left on your folder.  Please collect her $25 fee. Thank you.

## 2017-03-26 DIAGNOSIS — H3530 Unspecified macular degeneration: Secondary | ICD-10-CM | POA: Diagnosis not present

## 2017-03-26 DIAGNOSIS — M810 Age-related osteoporosis without current pathological fracture: Secondary | ICD-10-CM | POA: Diagnosis not present

## 2017-03-26 DIAGNOSIS — I10 Essential (primary) hypertension: Secondary | ICD-10-CM | POA: Diagnosis not present

## 2017-03-26 DIAGNOSIS — I4891 Unspecified atrial fibrillation: Secondary | ICD-10-CM | POA: Diagnosis not present

## 2017-03-26 DIAGNOSIS — Z48815 Encounter for surgical aftercare following surgery on the digestive system: Secondary | ICD-10-CM | POA: Diagnosis not present

## 2017-03-26 NOTE — Discharge Summary (Signed)
Physician Discharge Summary  Patient ID: Paula CornsRoxanne D Gebbia MRN: [id number] DOB/AGE: [birth year] 83 y.o.  Admit date: 03/18/2017 Discharge date: 03/26/2017  Admission Diagnoses:  Discharge Diagnoses:  Active Problems:   Ischemic bowel disease (HCC)   Encounter for central line placement   Discharged Condition: good  Hospital Course: Overall healthy and independent 83 year old Female [facility] weeks after routine checkup with her PMD complaining of acute onset of abdominal pain associated with nausea and emesis. Workup was significant for jejunal ischemia and dilation with WBC 14 and lactate of 5. Informed consent was obtained, and patient underwent laparotomy with reduction of internal hernia and small bowel resection with primary anastomosis. Post-operatively, patient was admitted intubated to the ICU and a central venous catheter was placed for vasopressors, which were promptly able to be weaned, and patient was extubated. Patient otherwise experienced a brief post-operative ileus along with hypoglycemia treated with D10, both of which subsequently resolved, and the remainder of patient's post-operative course and hospital admission were uneventful. NG tube was removed, patient tolerated advancement of her diet and ambulation, and discharge planning was accordingly initiated. On POD#5, patient was safely able to be discharged home with appropriate instructions, follow-up, home PT/OT, and pain medication.  Consults: pulmonary/intensive care  Significant Diagnostic Studies: radiology: CT scan: small bowel ischemia without perforation  Treatments: IV hydration and surgery: exploratory laparotomy with small bowel resection and primary anastomosis (Pabon, 7/7)  Discharge Exam: Blood pressure (!) 167/79, pulse 84, temperature 98.1 F (36.7 C), temperature source Oral, resp. rate 16, height 5\' 4"  (1.626 m), weight 149 lb 0.5 oz (67.6 kg), SpO2 100 %. General appearance: alert,  cooperative, no distress and appears younger than stated age GI: soft, completely non-tender, and non-distended with incision well-approximated and no surrounding erythema or incisional drainage  Disposition: 01-Home or Self Care   Allergies as of 03/23/2017      Reactions   Latex Rash   Tetanus Toxoid Anaphylaxis   REACTION: anaphylaxis   Alendronate Sodium    REACTION: passed out      Medication List    TAKE these medications   docusate sodium 100 MG capsule Commonly known as:  COLACE Take 100 mg by mouth daily as needed for mild constipation.   ibuprofen 200 MG tablet Commonly known as:  ADVIL,MOTRIN Take 200 mg by mouth as needed.   ICAPS Caps Take 1 capsule by mouth daily.   lisinopril 10 MG tablet Commonly known as:  PRINIVIL,ZESTRIL TAKE ONE TABLET BY MOUTH ONCE DAILY      Follow-up Information    [facility] . Go on 04/05/2017.   Specialty:  General Surgery Why:  Dr. Everlene FarrierPabon, Wednesday, July 25 at 3:15 p.m.  [phone]  Contact information: [address] [place] [phone]          Signed: Ancil LinseyJason Evan Davis 03/26/2017, 1:17 PM

## 2017-03-27 ENCOUNTER — Other Ambulatory Visit: Payer: Self-pay

## 2017-03-27 NOTE — Telephone Encounter (Signed)
Thank you. Disability forms have been scanned under media

## 2017-03-27 NOTE — Patient Outreach (Signed)
[facility] Care Management  03/27/2017  Paula Luna [birth year] [id number]   EMMI: General discharge Referral date: 03/27/17 Referral source:EMMI general discharge red alert Referral reason: Knows who to call about changes: NO, questions/ problems: YES Day # 1  Telephone call to patient regarding EMMI general discharge referral.  HIPAA verified with patient. Patient gave verbal permission to speak with her daughter, Paula Luna regarding all of her personal health information.  Daughter states patient was admitted to the hospital due to issues with her intestines. States patient had to have emergency surgery involving a resection of her intestines. Daughter states patient has staples located vertically down the front of her abdomen.  RNCM reviewed signs/ symptoms of infection with daughter. Daughter denies any symptoms. Daughter states patients has a follow up appointment with Dr. Everlene FarrierPabon on 04/05/17.  Daughter states she needs help with bathing patient. States her home is not handicap accessible and she is concerned that she does not have the assistance she needs or equipment in place to assist with  patients care safely.  Daughter states she feels a shower chair would be beneficial for patient.  Daughter states a home health nurse from [facility] saw and assessed patient on yesterday. Stated she would receive a call from [facility] in the next day or so. Daughter states she was told by the nurse that patient would also have therapy services.  States [facility] home [facility] would also assess any additional needs patient may have such as medical equipment.  RNCM advised daughter to request home health aid assistance for patient.  Patient does not have follow up appointment with primary MD.  Daughter states she has spoke with the doctor / office regarding patients recent hospital admission. RNCM advised daughter to schedule post hospital discharge follow up appointment with patients  primary MD.  Daughter states patient has all of her medications and she is able to afford them. Daughter state she assists patient with her medications. Daughter states she provides patient transportation to her appointments.  RNCM discussed and offered [facility] care management services. Daughter agreed to receive contact information for [facility].  Would like to assess if additional services are needed after [facility] starts care. RNCM advised daughter to call [facility] if additional services needed or is there are any additional questions.  RNCM advised patient / Daughter to notify MD of any changes in condition prior to scheduled appointment. RNCM provided contact name and  main office number [phone] and 24 hour nurse advise line [phone].  RNCM verified patient aware of 911 services for urgent/ emergent needs.,  PLAN:  RNCM will refer patient to care management assistant to close due to refusal of services RNCM will notify patients primary MD of closure.   George InaDavina Gottlieb Zuercher RN,BSN,CCM [facility] Telephonic  [phone]

## 2017-03-28 ENCOUNTER — Ambulatory Visit (INDEPENDENT_AMBULATORY_CARE_PROVIDER_SITE_OTHER): Payer: PPO | Admitting: Family Medicine

## 2017-03-28 ENCOUNTER — Telehealth: Payer: Self-pay

## 2017-03-28 ENCOUNTER — Encounter: Payer: Self-pay | Admitting: Family Medicine

## 2017-03-28 VITALS — BP 145/69 | HR 83 | Temp 97.9°F | Ht 59.0 in | Wt 135.0 lb

## 2017-03-28 DIAGNOSIS — K559 Vascular disorder of intestine, unspecified: Secondary | ICD-10-CM

## 2017-03-28 DIAGNOSIS — K55029 Acute infarction of small intestine, extent unspecified: Secondary | ICD-10-CM | POA: Diagnosis not present

## 2017-03-28 NOTE — Telephone Encounter (Signed)
Marchelle FolksAmanda [facility]m [facility] for 1 week and 1 x a week for 2 weeks to ck incision wound; pt just discharged from [facility];pt had ischemic valve and resection done; also request HH OT eval and pt needs an elevated toilet seat. Amanda request cb.

## 2017-03-28 NOTE — Telephone Encounter (Signed)
Verbal [facility] provided to [facility] at [facility] for [facility]. [facility] .

## 2017-03-28 NOTE — Telephone Encounter (Signed)
Agree with all -

## 2017-03-28 NOTE — Telephone Encounter (Signed)
Verbal orders given to [facility], by telephone, for [facility] 2 x a [facility] for 1 week and 1 x a week for 2 weeks to ck incision wound and  HH OT evaluation.  DME order for elevated toilet seat entered in EPIC.  Will give order to patient's daughter today when they come in for Ms. Heinzel's hospital follow up.

## 2017-03-28 NOTE — Progress Notes (Signed)
Dr. Karleen Hampshire T. Jaimeson Gopal, MD, CAQ Sports Medicine Primary Care and Sports Medicine [address] [place], [address] Phone: [phone] Fax: [phone]  03/28/2017  Patient: Paula Luna, MRN: [id number], DOB: 1925-05-09, 83 y.o.  Primary Physician:  Hannah Beat, MD   Chief Complaint  Patient presents with  . Hospitalization Follow-up   Subjective:   Paula Luna is a 83 y.o. very pleasant female patient who presents with the following:  Admit date: 03/18/2017 Discharge date: 03/26/2017  7/25 f/u with surgery.  Patient went via EMS on March 18, 2017, to the ER secondary to severe abdominal pain.  She was found to have ischemic small bowel, and general surgery did a laparotomy with production of internal hernia and a small bowel resection with primary anastomosis.  The patient subsequently was in the ICU, and eventually extubated and transitioned to p.o. Food, became ambulatory, and was seen by physical therapy in the hospital.  She had a bowel movement the day after she was discharged from the hospital.  She continues to be regular and have bowel movements almost daily.  She is eating, though diminished somewhat compared to her prior good state of health and she is also drinking and ensure daily.  Wt Readings from Last 3 Encounters:  03/28/17 135 lb (61.2 kg)  03/18/17 149 lb 0.5 oz (67.6 kg)  02/15/17 139 lb 8 oz (63.3 kg)      Past Medical History, Surgical History, Social History, Family History, Problem List, Medications, and Allergies have been reviewed and updated if relevant.  Patient Active Problem List   Diagnosis Date Noted  . 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 Encounter for central line placement   . Macular degeneration of left eye 01/06/2015  . Obesity (BMI 30-39.9) 12/25/2013  . Essential hypertension, benign 12/25/2013  . BACK PAIN 08/02/2010    Past Medical History:  Diagnosis Date  .  Hypertension 12/25/2013  . Macular degeneration of left eye 01/06/2015  . Osteoporosis     Past Surgical History:  Procedure Laterality Date  . CLOSED REDUCTION PROXIMAL HUMERUS FRACTURE Left 2017  . LAPAROTOMY N/A 03/18/2017   Procedure: EXPLORATORY LAPAROTOMY, SMALL BOWEL RESECTION;  Surgeon: Leafy Ro, MD;  Location: [facility] ORS;  Service: General;  Laterality: N/A;  . TOTAL ABDOMINAL HYSTERECTOMY      Social History   Social History  . Marital status: Married    Spouse name: N/A  . Number of children: N/A  . Years of education: N/A   Occupational History  . 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 Smoking status: Former Games developer  . Smokeless tobacco: Never Used  . Alcohol use No  . Drug use: No  . Sexual activity: No   Other Topics Concern  . Not on file   Social History Narrative   Widowed after 64 years   Working some at [employer] in [employer] with daughter    No family history on file.  Allergies  Allergen Reactions  . Latex Rash  . Tetanus Toxoid Anaphylaxis    REACTION: anaphylaxis  . Alendronate Sodium     REACTION: passed out    Medication list reviewed and updated in full in [facility].   GEN: as above GI: No n/v/d, eating normally Pulm: No SOB Interactive and getting along well at home.  Otherwise,  ROS is as per the HPI.  Objective:   BP (!) 145/69   Pulse 83   Temp 97.9 F (36.6 C) (Oral)   Ht 4\' 11"  (1.499 m)   Wt 135 lb (61.2 kg)   BMI 27.27 kg/m   GEN: WDWN, NAD, Non-toxic, A & O x 3 HEENT: Atraumatic, Normocephalic. Neck supple. No masses, No LAD. Ears and Nose: No external deformity. CV: RRR, No M/G/R. No JVD. No thrill. No extra heart sounds. PULM: CTA B, no wheezes, crackles, rhonchi. No retractions. No resp. distress. No accessory muscle use. ABD: S, mild, appropriate tenderness in lower abdomen, ND, + BS, No rebound, No HSM. Incision is c/d/i, staples intact. EXTR: No  c/c/e NEURO Normal gait.  PSYCH: Normally interactive. Conversant. Not depressed or anxious appearing.  Calm demeanor.   Laboratory and Imaging Data:  Assessment and Plan:   Ischemic necrosis of small bowel (HCC)  She looks very good.  We arranged for home health to come assess and she currently has both physical therapy and occupational therapy.  We wrote for an elevated toilet seat today.  She looks very good for having just had open abdominal surgery and bowel resection.  Most recent hemoglobin was 8 and renal function has maintained normalcy.  Follow-up: No Follow-up on file.  Future Appointments Date Time Provider Department Center  04/05/2017 3:30 PM Pabon, GeorgiaDiego F, MD [facility] None  02/13/2018 1:00 PM Robert Bellowinson, Tarlesia R, LPN [facility]  02/19/2018 8:30 AM Charletha Dalpe, Karleen HampshireSpencer, MD [facility]   Signed,  Elpidio GaleaSpencer T. Kyaire Gruenewald, MD   Allergies as of 03/28/2017      Reactions   Latex Rash   Tetanus Toxoid Anaphylaxis   REACTION: anaphylaxis   Alendronate Sodium    REACTION: passed out      Medication List       Accurate as of 03/28/17  4:16 PM. Always use your most recent med list.          docusate sodium 100 MG capsule Commonly known as:  COLACE Take 100 mg by mouth daily as needed for mild constipation.   ibuprofen 200 MG tablet Commonly known as:  ADVIL,MOTRIN Take 200 mg by mouth as needed.   ICAPS Caps Take 1 capsule by mouth daily.   lisinopril 10 MG tablet Commonly known as:  PRINIVIL,ZESTRIL TAKE ONE TABLET BY MOUTH ONCE DAILY

## 2017-03-29 ENCOUNTER — Telehealth: Payer: Self-pay

## 2017-03-29 DIAGNOSIS — I4891 Unspecified atrial fibrillation: Secondary | ICD-10-CM | POA: Diagnosis not present

## 2017-03-29 DIAGNOSIS — M810 Age-related osteoporosis without current pathological fracture: Secondary | ICD-10-CM | POA: Diagnosis not present

## 2017-03-29 DIAGNOSIS — Z48815 Encounter for surgical aftercare following surgery on the digestive system: Secondary | ICD-10-CM | POA: Diagnosis not present

## 2017-03-29 DIAGNOSIS — I10 Essential (primary) hypertension: Secondary | ICD-10-CM | POA: Diagnosis not present

## 2017-03-29 DIAGNOSIS — H3530 Unspecified macular degeneration: Secondary | ICD-10-CM | POA: Diagnosis not present

## 2017-03-29 NOTE — Telephone Encounter (Signed)
Patient had some forms dropped off to be filled out. She has missed her plane due to her hospital stay and needs the medical certificate completed and faxed to Claims Department at [phone]. Please let me know when these forms have been completed.

## 2017-03-31 ENCOUNTER — Telehealth: Payer: Self-pay

## 2017-03-31 ENCOUNTER — Encounter: Payer: Self-pay | Admitting: *Deleted

## 2017-03-31 NOTE — Telephone Encounter (Signed)
Connie OT with [facility] advised. 

## 2017-03-31 NOTE — Telephone Encounter (Signed)
Junious Dresseronnie OT with [facility] left v/m requesting verbal [facility] for 3 weeks.

## 2017-03-31 NOTE — Telephone Encounter (Signed)
Wes PT with [facility] left v/m requesting an rx for elevated toilet seat with arm rests to facilitate toileting. Pt last seen 03/28/17. Dr Patsy Lageropland out of office for a wk.Please advise.Request rx to be faxed to [phone].

## 2017-03-31 NOTE — Telephone Encounter (Signed)
Please give the order.  Dx [id number].  Thanks.

## 2017-03-31 NOTE — Telephone Encounter (Signed)
Order written, signed and faxed [facility]

## 2017-03-31 NOTE — Telephone Encounter (Signed)
Please ok those verbal orders Thanks  

## 2017-04-03 ENCOUNTER — Telehealth: Payer: Self-pay | Admitting: Family Medicine

## 2017-04-03 DIAGNOSIS — Z48815 Encounter for surgical aftercare following surgery on the digestive system: Secondary | ICD-10-CM | POA: Diagnosis not present

## 2017-04-03 DIAGNOSIS — I10 Essential (primary) hypertension: Secondary | ICD-10-CM | POA: Diagnosis not present

## 2017-04-03 DIAGNOSIS — I4891 Unspecified atrial fibrillation: Secondary | ICD-10-CM | POA: Diagnosis not present

## 2017-04-03 DIAGNOSIS — H3530 Unspecified macular degeneration: Secondary | ICD-10-CM | POA: Diagnosis not present

## 2017-04-03 DIAGNOSIS — M810 Age-related osteoporosis without current pathological fracture: Secondary | ICD-10-CM | POA: Diagnosis not present

## 2017-04-03 NOTE — Telephone Encounter (Signed)
Called Marchelle FolksAmanda at [facility] to ask how we can add a bath aide. She was unclear and said we could add it verbally so I asked them to add it. She did say that usually PT is the one who says she is strong enough to go upstairs to receive a full shower or bath and they may not have said it was ok yet. The patient still has staples in her abdomen and I think she is only supposed to have sponge baths until she gets the staples out. Spoke to daughter Dewayne Hatchnn and explained that the CMA Lupita LeashDonna [facility] and it had to go through [employer] insurance co before it gets approved. She no longer wants the bath chair or elevated toilet seat at this point. She really just wants help bathing her mother. I had to leave a VM with Marchelle Folksmanda again this afternoon to see what she found out about the bath aide. It may be best from now on to just give the DME orders directly to the patients so they can take the orders directly to a place and get what their person needs right away.

## 2017-04-03 NOTE — Telephone Encounter (Signed)
Shirlee LimerickMarion,  Do you know if this can be done for Ms. Agar?

## 2017-04-03 NOTE — Telephone Encounter (Signed)
[name] called -  She is upset that [facility] and how they have delayed getting a bathroom chair and a nurse to help bathe patient.  She was finally contacted today about the bathroom chair, and declined because she found a way to make it work.  She is upset that pt has been home for 11 days before being contacted.  She still needs someone to come in and help bathe pt.  So far, pt has only had sponge baths up to this point.  cb number [phone]

## 2017-04-04 DIAGNOSIS — H3530 Unspecified macular degeneration: Secondary | ICD-10-CM | POA: Diagnosis not present

## 2017-04-04 DIAGNOSIS — Z48815 Encounter for surgical aftercare following surgery on the digestive system: Secondary | ICD-10-CM | POA: Diagnosis not present

## 2017-04-04 DIAGNOSIS — M810 Age-related osteoporosis without current pathological fracture: Secondary | ICD-10-CM | POA: Diagnosis not present

## 2017-04-04 DIAGNOSIS — I10 Essential (primary) hypertension: Secondary | ICD-10-CM | POA: Diagnosis not present

## 2017-04-04 DIAGNOSIS — I4891 Unspecified atrial fibrillation: Secondary | ICD-10-CM | POA: Diagnosis not present

## 2017-04-05 ENCOUNTER — Ambulatory Visit (INDEPENDENT_AMBULATORY_CARE_PROVIDER_SITE_OTHER): Payer: PPO | Admitting: Surgery

## 2017-04-05 ENCOUNTER — Encounter: Payer: Self-pay | Admitting: Surgery

## 2017-04-05 VITALS — BP 151/82 | HR 89 | Temp 97.6°F | Ht 59.0 in | Wt 134.2 lb

## 2017-04-05 DIAGNOSIS — Z09 Encounter for follow-up examination after completed treatment for conditions other than malignant neoplasm: Secondary | ICD-10-CM

## 2017-04-05 NOTE — Patient Instructions (Signed)
Please do not submerge in a tub, hot tub, or pool until incisions are completely sealed.  Use sun block to incision area over the next year if this area will be exposed to sun. This helps decrease scarring.  You may resume your normal activities on 04/29/17. At that time- Listen to your body when lifting, if you have pain when lifting, stop and then try again in a few days. Soreness after doing exercises or activities of daily living is normal as you get back in to your normal routine.  If you develop redness, drainage, or pain at incision sites- call our office immediately and speak with a nurse.  Please call our office with any questions or concerns that you may have.

## 2017-04-06 ENCOUNTER — Telehealth: Payer: Self-pay

## 2017-04-06 NOTE — Progress Notes (Signed)
S/p lap bowel resection for internal hernia Doing very well considering her age Taking PO, no pain Path d/w pt  PE NAD Abd: soft, staples removed. No infection or peritonitis  A/P Doing well No heavy lifting F/U prn

## 2017-04-06 NOTE — Telephone Encounter (Signed)
Patients daughter called stating that her mother was upset due to a call being made asking if she would be ready to return to work on 04/29/17. She stated that she doesn't feel her mother will be ready to return on that day. She stated that her mother has been feeling lightheaded today and lightheaded this morning. She stated has been  walking with the PT but has been having the need to hold on when walking. She stated that her mother is a lot slower than what she was before not sure if its her age or her recovering for the surgery. She doesn't feel that she will be able to stand from 7:30-3:30 for 4 days a week. I told her that she would have to be seen back in the office for a follow up if she wants her normal activity date be extended. I told her that I will call her back with this appointment.   Called patient's daughter at this time to inform her of appointment made on 8/15 at 3:00PM Patient's daughter verbalized understanding.

## 2017-04-07 DIAGNOSIS — H3530 Unspecified macular degeneration: Secondary | ICD-10-CM | POA: Diagnosis not present

## 2017-04-07 DIAGNOSIS — I10 Essential (primary) hypertension: Secondary | ICD-10-CM | POA: Diagnosis not present

## 2017-04-07 DIAGNOSIS — M810 Age-related osteoporosis without current pathological fracture: Secondary | ICD-10-CM | POA: Diagnosis not present

## 2017-04-07 DIAGNOSIS — Z48815 Encounter for surgical aftercare following surgery on the digestive system: Secondary | ICD-10-CM | POA: Diagnosis not present

## 2017-04-07 DIAGNOSIS — I4891 Unspecified atrial fibrillation: Secondary | ICD-10-CM | POA: Diagnosis not present

## 2017-04-07 NOTE — Telephone Encounter (Signed)
Patient's Disability form was filled out and [facility] with corrections. Patient's Return to work date is on 04/30/2017 with no restrictions. However, since she is requesting additional time to be out of work. Patient will need to see Dr. Everlene FarrierPabon on 04/26/2014 for a possible extension.  Patient's Airfare Form was filled out and faxed.

## 2017-04-07 NOTE — Telephone Encounter (Signed)
Patient's daughter called in and would like to have the paperwork in regards to her airplane, filled out and faxed today if possible.   She is also requesting that we place her mother out of work for several more weeks as she is not ready to go back to work at this time. Her number is [phone]. She states that we will receive more paperwork to send back to [facility] on Monday and she will call to make sure that we get this paperwork.

## 2017-04-10 DIAGNOSIS — I4891 Unspecified atrial fibrillation: Secondary | ICD-10-CM | POA: Diagnosis not present

## 2017-04-10 DIAGNOSIS — H3530 Unspecified macular degeneration: Secondary | ICD-10-CM | POA: Diagnosis not present

## 2017-04-10 DIAGNOSIS — M810 Age-related osteoporosis without current pathological fracture: Secondary | ICD-10-CM | POA: Diagnosis not present

## 2017-04-10 DIAGNOSIS — I10 Essential (primary) hypertension: Secondary | ICD-10-CM | POA: Diagnosis not present

## 2017-04-10 DIAGNOSIS — Z48815 Encounter for surgical aftercare following surgery on the digestive system: Secondary | ICD-10-CM | POA: Diagnosis not present

## 2017-04-11 DIAGNOSIS — M810 Age-related osteoporosis without current pathological fracture: Secondary | ICD-10-CM | POA: Diagnosis not present

## 2017-04-11 DIAGNOSIS — I10 Essential (primary) hypertension: Secondary | ICD-10-CM | POA: Diagnosis not present

## 2017-04-11 DIAGNOSIS — H3530 Unspecified macular degeneration: Secondary | ICD-10-CM | POA: Diagnosis not present

## 2017-04-11 DIAGNOSIS — Z48815 Encounter for surgical aftercare following surgery on the digestive system: Secondary | ICD-10-CM | POA: Diagnosis not present

## 2017-04-11 DIAGNOSIS — I4891 Unspecified atrial fibrillation: Secondary | ICD-10-CM | POA: Diagnosis not present

## 2017-04-17 ENCOUNTER — Other Ambulatory Visit: Payer: Self-pay | Admitting: Family Medicine

## 2017-04-17 ENCOUNTER — Telehealth: Payer: Self-pay

## 2017-04-17 DIAGNOSIS — I4891 Unspecified atrial fibrillation: Secondary | ICD-10-CM | POA: Diagnosis not present

## 2017-04-17 DIAGNOSIS — H3530 Unspecified macular degeneration: Secondary | ICD-10-CM | POA: Diagnosis not present

## 2017-04-17 DIAGNOSIS — I10 Essential (primary) hypertension: Secondary | ICD-10-CM | POA: Diagnosis not present

## 2017-04-17 DIAGNOSIS — M810 Age-related osteoporosis without current pathological fracture: Secondary | ICD-10-CM | POA: Diagnosis not present

## 2017-04-17 DIAGNOSIS — Z48815 Encounter for surgical aftercare following surgery on the digestive system: Secondary | ICD-10-CM | POA: Diagnosis not present

## 2017-04-17 NOTE — Telephone Encounter (Signed)
Spoke with [employer]PG Industriesna.  She states they are scheduled to see the surgeon on 08/15.2018 at 3:00pm and she is just concerned that the surgeon will not extend Paula Luna's leave. I advised her to discuss concerns with the surgeon and that he would probably extend her leave, but if she runs into any problems with him doing that, then Dr. Patsy Lageropland would be happy to complete any paperwork needed to extend her leave of absence from work,

## 2017-04-17 NOTE — Telephone Encounter (Signed)
[name] (DPR signed) left v/m; the surgeon that operated on pt in 03/18/2017 is ready to release pt; pt is on leave of absence from work; pt is not stable enough to go back to work yet; pt still weak and gets light headed. [name] request cb. Pt seen 03/18/17. [name] request cb.

## 2017-04-17 NOTE — Telephone Encounter (Signed)
She is 83 years old. I would certainly be okay with that, and if they would like to follow-up with me in about a month if that would be entirely reasonable. At that point we can further address potential for work.  I think it is most important for her to eat well and work on her therapy and recovery right now.

## 2017-04-19 DIAGNOSIS — H3530 Unspecified macular degeneration: Secondary | ICD-10-CM | POA: Diagnosis not present

## 2017-04-19 DIAGNOSIS — I4891 Unspecified atrial fibrillation: Secondary | ICD-10-CM | POA: Diagnosis not present

## 2017-04-19 DIAGNOSIS — I10 Essential (primary) hypertension: Secondary | ICD-10-CM | POA: Diagnosis not present

## 2017-04-19 DIAGNOSIS — M810 Age-related osteoporosis without current pathological fracture: Secondary | ICD-10-CM | POA: Diagnosis not present

## 2017-04-19 DIAGNOSIS — Z48815 Encounter for surgical aftercare following surgery on the digestive system: Secondary | ICD-10-CM | POA: Diagnosis not present

## 2017-04-21 DIAGNOSIS — H35363 Drusen (degenerative) of macula, bilateral: Secondary | ICD-10-CM | POA: Diagnosis not present

## 2017-04-21 DIAGNOSIS — H16223 Keratoconjunctivitis sicca, not specified as Sjogren's, bilateral: Secondary | ICD-10-CM | POA: Diagnosis not present

## 2017-04-21 DIAGNOSIS — H353132 Nonexudative age-related macular degeneration, bilateral, intermediate dry stage: Secondary | ICD-10-CM | POA: Diagnosis not present

## 2017-04-24 DIAGNOSIS — I10 Essential (primary) hypertension: Secondary | ICD-10-CM | POA: Diagnosis not present

## 2017-04-24 DIAGNOSIS — M810 Age-related osteoporosis without current pathological fracture: Secondary | ICD-10-CM | POA: Diagnosis not present

## 2017-04-24 DIAGNOSIS — Z48815 Encounter for surgical aftercare following surgery on the digestive system: Secondary | ICD-10-CM | POA: Diagnosis not present

## 2017-04-24 DIAGNOSIS — H3530 Unspecified macular degeneration: Secondary | ICD-10-CM | POA: Diagnosis not present

## 2017-04-24 DIAGNOSIS — I4891 Unspecified atrial fibrillation: Secondary | ICD-10-CM | POA: Diagnosis not present

## 2017-04-25 ENCOUNTER — Telehealth: Payer: Self-pay

## 2017-04-26 ENCOUNTER — Encounter: Payer: Self-pay | Admitting: Surgery

## 2017-04-26 ENCOUNTER — Encounter: Payer: Self-pay | Admitting: Family Medicine

## 2017-04-26 ENCOUNTER — Ambulatory Visit (INDEPENDENT_AMBULATORY_CARE_PROVIDER_SITE_OTHER): Payer: PPO | Admitting: Surgery

## 2017-04-26 ENCOUNTER — Telehealth: Payer: Self-pay | Admitting: Family Medicine

## 2017-04-26 ENCOUNTER — Ambulatory Visit (INDEPENDENT_AMBULATORY_CARE_PROVIDER_SITE_OTHER): Payer: PPO | Admitting: Family Medicine

## 2017-04-26 ENCOUNTER — Telehealth: Payer: Self-pay

## 2017-04-26 VITALS — BP 185/63 | HR 103 | Temp 98.3°F | Wt 133.0 lb

## 2017-04-26 VITALS — BP 125/70 | HR 99 | Temp 97.4°F | Wt 132.5 lb

## 2017-04-26 DIAGNOSIS — D649 Anemia, unspecified: Secondary | ICD-10-CM | POA: Diagnosis not present

## 2017-04-26 DIAGNOSIS — I4821 Permanent atrial fibrillation: Secondary | ICD-10-CM | POA: Insufficient documentation

## 2017-04-26 DIAGNOSIS — I10 Essential (primary) hypertension: Secondary | ICD-10-CM

## 2017-04-26 DIAGNOSIS — I4891 Unspecified atrial fibrillation: Secondary | ICD-10-CM | POA: Insufficient documentation

## 2017-04-26 DIAGNOSIS — R531 Weakness: Secondary | ICD-10-CM

## 2017-04-26 DIAGNOSIS — I482 Chronic atrial fibrillation, unspecified: Secondary | ICD-10-CM

## 2017-04-26 NOTE — Telephone Encounter (Signed)
I will see her then  

## 2017-04-26 NOTE — Patient Instructions (Signed)
Blood pressure is much better now 125/70 -both arms  Glad you are feeling better  Labs today for weakness  You are still in atrial fibrillation   If symptoms return please let us know

## 2017-04-26 NOTE — Telephone Encounter (Signed)
Patient Name: Paula AverROXANNE Sandate  DOB: [birth year]    Initial Comment Caller states that her mother wants to know if she can drink a ensure before she has a EKG. Her appointment is at 6 pm today.    Nurse Assessment  Nurse: Elwyn LadeBurress, RN, Misty StanleyLisa Date/Time (Eastern Time): 04/26/2017 5:13:41 PM  Confirm and document reason for call. If symptomatic, describe symptoms. ---Caller states that her mother wants to know if she can drink a ensure before she has a EKG. Her appointment is in 45 min  Does the patient have any new or worsening symptoms? ---No  Please document clinical information provided and list any resource used. ---advised that there are no food or fluid restrictions in preparation for an EKG, so an Ensure should be fine; per website [employer].co.ukhttps://www.webmd.com/heart-disease/guide/electrocardiogram-specialized-ekgs#1-2; understanding verb     Guidelines    Guideline Title Affirmed Question Affirmed Notes       Final Disposition User

## 2017-04-26 NOTE — Patient Instructions (Addendum)
Please go to [facility] today at 6:00 PM to be seen by Dr. Milinda Antisower. They will do an EKG on you today.  You do not need to come back to see us. However, if you have any questions, please give us a call.

## 2017-04-26 NOTE — Progress Notes (Signed)
S/p bowel resection 5 weeks ago Feeling weak HR 110 irregular  Increase bp Taking po, no abd pain. + bm  PE NAD A fib w rvr Abd: soft, nt scar well healed  A/P Doing well Needs to see PCP regarding A fib inc BP and other medical issues. D/W office and they will see her today No surgical issues at this time

## 2017-04-26 NOTE — Progress Notes (Signed)
Subjective:    Patient ID: Paula Luna, female    DOB: 11/30/1924, 83 y.o.   MRN: [id number]  HPI 83 yo [name] with hx of HTN and recent abdominal surgery presents with weakness and elevated bp  (note-bp was taken after being nervous and walking down a long hallway)   Now in the office she feels much better-like herself   Wt Readings from Last 3 Encounters:  04/26/17 132 lb 8 oz (60.1 kg)  04/26/17 133 lb (60.3 kg)  04/05/17 134 lb 3.2 oz (60.9 kg)   BP Readings from Last 3 Encounters:  04/26/17 (!) 158/92  04/26/17 (!) 185/63  04/05/17 (!) 151/82  family noted her bp was high at home  Re checking after sitting 15 minutes today BP: 125/70  This was in both arms with manual reg size cuff   She has been getting PT   Moving bowels almost every day   Said she felt a bit weak when she rose after putting shoes on  A little brief nausea and dizziness (very brief)  Feels fine now   EKG today shows a fib with rate of 88 No change since hospitalization   shw saw Dr Everlene Farrier for f/u of bowel resection 5 weeks ago and she c/o of generalized weakness and noted her bp was elevated at 185/63 She had ischemic necrosis of bowel  Hospital notes indicate she had reduction of internal hernia and small bowel resection  Ost op she req vasopressors and exp a brief post op ileus along with hypoglycemia Then made a good recovery   She has hx of chronic a fib   EKG is unchanged from baseline with a fib and rate in the 80s today   Lab Results  Component Value Date   WBC 7.0 03/20/2017   HGB 8.6 (L) 03/20/2017   HCT 25.9 (L) 03/20/2017   MCV 78.5 (L) 03/20/2017   PLT 121 (L) 03/20/2017     Chemistry      Component Value Date/Time   NA 141 03/23/2017 0408   K 4.1 03/23/2017 0408   CL 110 03/23/2017 0408   CO2 27 03/23/2017 0408   BUN 8 03/23/2017 0408   CREATININE 0.64 03/23/2017 0408      Component Value Date/Time   CALCIUM 8.4 (L) 03/23/2017 0408   ALKPHOS 47  03/20/2017 0445   AST 16 03/20/2017 0445   ALT 9 (L) 03/20/2017 0445   BILITOT 0.5 03/20/2017 0445      She takes lisinopril 10 mg for HTN   Patient Active Problem List   Diagnosis Date Noted  . Atrial fibrillation (HCC) 04/26/2017  . Anemia 04/26/2017  . 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 Macular degeneration of left eye 01/06/2015  . Essential hypertension, benign 12/25/2013  . BACK PAIN 08/02/2010   Past Medical History:  Diagnosis Date  . Hypertension 12/25/2013  . Macular degeneration of left eye 01/06/2015  . Osteoporosis    Past Surgical History:  Procedure Laterality Date  . CLOSED REDUCTION PROXIMAL HUMERUS FRACTURE Left 2017  . LAPAROTOMY N/A 03/18/2017   Procedure: EXPLORATORY LAPAROTOMY, SMALL BOWEL RESECTION;  Surgeon: Leafy Ro, MD;  Location: [facility] ORS;  Service: General;  Laterality: N/A;  . TOTAL ABDOMINAL HYSTERECTOMY     Social History  Substance Use Topics  . Smoking status: Former Games developer  . Smokeless tobacco: Never Used  . Alcohol use No   No  family history on file. Allergies  Allergen Reactions  . Latex Rash  . Tetanus Toxoid Anaphylaxis    REACTION: anaphylaxis  . Alendronate Sodium     REACTION: passed out   Current Outpatient Prescriptions on File Prior to Visit  Medication Sig Dispense Refill  . docusate sodium (COLACE) 100 MG capsule Take 100 mg by mouth daily as needed for mild constipation.    Marland Kitchen. lisinopril (PRINIVIL,ZESTRIL) 10 MG tablet TAKE 1 TABLET BY MOUTH ONCE DAILY 90 tablet 1  . Multiple Vitamins-Minerals (ICAPS) CAPS Take 1 capsule by mouth daily.     No current facility-administered medications on file prior to visit.        Review of Systems Review of Systems  Constitutional: Negative for fever, appetite change, fatigue and unexpected weight change.  Eyes: Negative for pain and pos for baseline vision change from macular edema  Respiratory: Negative for cough and  shortness of breath.   Cardiovascular: Negative for cp or palpitations    Gastrointestinal: Negative for nausea, diarrhea and constipation. (pos for recent abd surgery that is healed) Genitourinary: Negative for urgency and frequency.  Skin: Negative for pallor or rash   Neurological: Negative for weakness, light-headedness, numbness and headaches.  Hematological: Negative for adenopathy. Does not bruise/bleed easily.  Psychiatric/Behavioral: Negative for dysphoric mood. The patient  not nervous/anxious.         Objective:   Physical Exam  Constitutional: She appears well-developed and well-nourished. No distress.  HENT:  Head: Normocephalic and atraumatic.  Mouth/Throat: Oropharynx is clear and moist.  Eyes: Pupils are equal, round, and reactive to light. Conjunctivae and EOM are normal.  Neck: Normal range of motion. Neck supple. No JVD present. Carotid bruit is not present. No thyromegaly present.  Cardiovascular: Normal rate, normal heart sounds and intact distal pulses.  Exam reveals no gallop.   Irregularly irregular rhythm with nl rate   Pulmonary/Chest: Effort normal and breath sounds normal. No respiratory distress. She has no wheezes. She has no rales.  No crackles  Abdominal: Soft. Bowel sounds are normal. She exhibits no distension, no abdominal bruit and no mass. There is no tenderness.  Musculoskeletal: She exhibits no edema or tenderness.  No s/s of DVT Neg homan's sign  Lymphadenopathy:    She has no cervical adenopathy.  Neurological: She is alert. She has normal reflexes. No cranial nerve deficit. She exhibits normal muscle tone. Coordination normal.  Skin: Skin is warm and dry. No rash noted. No pallor.  Psychiatric: She has a normal mood and affect.  Initially anxious  Then pleasant and talkative            Assessment & Plan:   Problem List Items Addressed This Visit      Cardiovascular and Mediastinum   Atrial fibrillation (HCC) - Primary    Per pt  and family -this started during her hospitalization for emergent GI surgery and she has not come out of it  Nl rate No symptoms  She has not had w/u and is not anticoagulated  Will alert her pcp      Relevant Orders   EKG 12-Lead (Completed)   CBC with Differential/Platelet (Completed)   TSH (Completed)   Essential hypertension, benign    bp in fair control at this time (after being very high at surg office and higher on first check here)  She feels better now and more relaxed   BP Readings from Last 1 Encounters:  04/26/17 125/70   No changes needed Disc lifstyle  change with low sodium diet and exercise  Labs ordered       Relevant Orders   CBC with Differential/Platelet (Completed)   Comprehensive metabolic panel (Completed)   TSH (Completed)     Other   Anemia    Pt had a brief episode of weakness this am (positional)  Now feels normal  She was significantly anemic after surgery  Lab today for cbc      RESOLVED: Weakness   Relevant Orders   CBC with Differential/Platelet (Completed)   Comprehensive metabolic panel (Completed)   TSH (Completed)

## 2017-04-26 NOTE — Telephone Encounter (Signed)
Dr Hurman HornPabon's office called [facility] was [facility] to weakness; pt recently had lap bowel resection for internal hernia and pts weakness is not related to surgery.Dr Everlene FarrierPabon wants pt to have EKG and will send to hospital for EKG unless [facility]. I spoke with Dr Milinda Antisower and she said OK to put pt in 6PM slot today; at Dr Hurman HornPabon's office BP 185/63, P 103 with irregular pulse. No complaints other than weakness. Dr Pabon's office will [facility]. FYI to Dr Milinda Antisower.

## 2017-04-27 ENCOUNTER — Telehealth: Payer: Self-pay

## 2017-04-27 LAB — CBC WITH DIFFERENTIAL/PLATELET
BASOS PCT: 1.2 % (ref 0.0–3.0)
Basophils Absolute: 0.1 10*3/uL (ref 0.0–0.1)
EOS PCT: 3 % (ref 0.0–5.0)
Eosinophils Absolute: 0.2 10*3/uL (ref 0.0–0.7)
HEMATOCRIT: 36.8 % (ref 36.0–46.0)
Hemoglobin: 11.5 g/dL — ABNORMAL LOW (ref 12.0–15.0)
LYMPHS ABS: 2.2 10*3/uL (ref 0.7–4.0)
LYMPHS PCT: 32.2 % (ref 12.0–46.0)
MCHC: 31.2 g/dL (ref 30.0–36.0)
MCV: 78.4 fl (ref 78.0–100.0)
Monocytes Absolute: 0.6 10*3/uL (ref 0.1–1.0)
Monocytes Relative: 8.7 % (ref 3.0–12.0)
NEUTROS ABS: 3.8 10*3/uL (ref 1.4–7.7)
Neutrophils Relative %: 54.9 % (ref 43.0–77.0)
PLATELETS: 241 10*3/uL (ref 150.0–400.0)
RBC: 4.69 Mil/uL (ref 3.87–5.11)
RDW: 16.4 % — AB (ref 11.5–15.5)
WBC: 6.9 10*3/uL (ref 4.0–10.5)

## 2017-04-27 LAB — COMPREHENSIVE METABOLIC PANEL
ALT: 7 U/L (ref 0–35)
AST: 13 U/L (ref 0–37)
Albumin: 3.6 g/dL (ref 3.5–5.2)
Alkaline Phosphatase: 76 U/L (ref 39–117)
BILIRUBIN TOTAL: 0.3 mg/dL (ref 0.2–1.2)
BUN: 13 mg/dL (ref 6–23)
CALCIUM: 9.8 mg/dL (ref 8.4–10.5)
CHLORIDE: 106 meq/L (ref 96–112)
CO2: 29 meq/L (ref 19–32)
Creatinine, Ser: 0.81 mg/dL (ref 0.40–1.20)
GFR: 70.51 mL/min (ref >60.00)
Glucose, Bld: 130 mg/dL — ABNORMAL HIGH (ref 70–99)
POTASSIUM: 4.8 meq/L (ref 3.5–5.1)
Sodium: 142 mEq/L (ref 135–145)
Total Protein: 6.4 g/dL (ref 6.0–8.3)

## 2017-04-27 LAB — TSH: TSH: 1.31 u[IU]/mL (ref 0.35–4.50)

## 2017-04-27 NOTE — Telephone Encounter (Signed)
Patient's daughter returned call for her appointment reminder. Confirming appointment.

## 2017-04-27 NOTE — Telephone Encounter (Signed)
Patient's disability form was fixed with new information and faxed.

## 2017-04-27 NOTE — Assessment & Plan Note (Signed)
Pt had a brief episode of weakness this am (positional)  Now feels normal  She was significantly anemic after surgery  Lab today for cbc

## 2017-04-27 NOTE — Assessment & Plan Note (Signed)
Per pt and family -this started during her hospitalization for emergent GI surgery and she has not come out of it  Nl rate No symptoms  She has not had w/u and is not anticoagulated  Will alert her pcp

## 2017-04-27 NOTE — Telephone Encounter (Signed)
[name] (DPR signed) request cb; [name] request Dr Patsy Lageropland to review office notes for Dr Everlene FarrierPabon and Dr Milinda Antisower on 04/26/17. [name] wants to know if pt should see cardiologist due to afib; Dewayne Hatchnn also wants to know is it better for pt to be in a fib all the time or have a fib on and off. [name] also wants to know if Dr Patsy Lageropland thinks [place] [facility] [place] had her [place] 8 weeks prior. Pt would be going for 3 days for a wedding. Please advise.

## 2017-04-27 NOTE — Telephone Encounter (Signed)
I spoke to Paula Luna within the last few minutes. I think it is OK for her to go to wedding - with wheelchair and airport assistance, recognizing that there is some risk.  AF is a more complicated question in a 83 year old with recent major abdominal surgery. I think getting cardiology involved a good idea in a few weeks to give more time for recovery. Primary question is regarding anticoagulation long-term and management.

## 2017-04-27 NOTE — Assessment & Plan Note (Signed)
bp in fair control at this time (after being very high at surg office and higher on first check here)  She feels better now and more relaxed   BP Readings from Last 1 Encounters:  04/26/17 125/70   No changes needed Disc lifstyle change with low sodium diet and exercise  Labs ordered

## 2017-04-28 ENCOUNTER — Other Ambulatory Visit: Payer: Self-pay | Admitting: Family Medicine

## 2017-04-28 DIAGNOSIS — I4891 Unspecified atrial fibrillation: Secondary | ICD-10-CM

## 2017-05-01 ENCOUNTER — Telehealth: Payer: Self-pay | Admitting: General Practice

## 2017-05-01 NOTE — Telephone Encounter (Signed)
Patients daughter called said she asked you for some notes to send into [facility], but they have not received anything please call patient and advice.

## 2017-05-02 ENCOUNTER — Telehealth: Payer: Self-pay

## 2017-05-02 NOTE — Telephone Encounter (Signed)
Patient's daughter, Roger Shelter called in at this time and is requesting that previously sent Physician note is sent to [facility] at the numbers below: [phone] and [phone] with WIN #: [id number] attached.  She states that she will check with [employer] today and if they still do not have this information, she will return phone call to [place].

## 2017-05-02 NOTE — Telephone Encounter (Signed)
Amber spoke with [name] (patient's daughter) and verified with her that patient's disability paperwork was filled out with new information and faxed to [place].

## 2017-05-03 ENCOUNTER — Telehealth: Payer: Self-pay

## 2017-05-03 NOTE — Telephone Encounter (Addendum)
12 pages of Disability Forms and Office notes faxed to [employer] at this time. Faxed to both numbers with positive confirmation. All information had WIN and case number on paperwork. WIN #: [id number] and Claim #: [id number].  [phone]- fax job# [id number] [phone]- fax job# [phone]

## 2017-05-03 NOTE — Telephone Encounter (Signed)
Patient's Return to Work form was filled out and faxed back to [place] at this time with a return to work date of 05/28/17 without restrictions. All supporting documentation has been previously sent today.  Faxed to [phone]  Positive confirmation of fax was received.   Fax Job #: [id number]

## 2017-05-16 ENCOUNTER — Telehealth: Payer: Self-pay

## 2017-05-16 NOTE — Telephone Encounter (Signed)
[name] pts daughter (DPR signed) the surgeon that removed part of pts intestines has pt out of work until 05/27/2017; pt has appt on 06/27/17 with cardiologist that was requested by Dr Patsy Lageropland. Dewayne Hatchnn thinks pt needs Dr Patsy Lageropland to have pt out of work between the note of the surgeon 05/27/17 and and the upcoming appt with cardiologist on 06/27/17. [name] request cb. Please advise.

## 2017-05-17 NOTE — Telephone Encounter (Signed)
Letter faxed [facility] at [phone] as requested.

## 2017-05-17 NOTE — Telephone Encounter (Signed)
I think that is reasonable for this 83 year old patient.

## 2017-05-17 NOTE — Telephone Encounter (Addendum)
Left message for Paula Hatchnn to return my call.  I need to know if Ms Paula Luna just needs a letter or do they have forms that Dr. Patsy Lageropland needs to be completed for her to be out of work until after 06/27/17 cardiology visit.

## 2017-05-17 NOTE — Telephone Encounter (Signed)
[name] called back with the fax number.  Send to [employer] at fax number [phone] or [phone]. Claim #[id number].

## 2017-05-17 NOTE — Telephone Encounter (Signed)
[name] called back and said they just mentioned a note stating Dr.Copland is patient's PCP and that she needs to be out of work until after her cardiologist appointment on 06/27/17. Dewayne Hatchnn will call back after 2:00 with fax number.

## 2017-05-19 NOTE — Telephone Encounter (Signed)
Noted. She is 83 years old, recent intrabdominal surgery, dyspnea, a-fib

## 2017-05-19 NOTE — Telephone Encounter (Signed)
Patient's daughter,[name],called.  She spoke to [employer] [facility] and they said they'll approve patient being out of work until 06/27/17. They need a letter from Dr.Copland stating the medical reason she needs to go to the Cardiologist. Dewayne Hatchnn said patient stands 7-8 hours at work.  Patient isn't stable. Patient was told to walk through the house 4 times a day. When she does it she's weak, short of breath, and lightheaded. The letter [facility] be [facility] at fax number [phone].

## 2017-05-25 ENCOUNTER — Telehealth: Payer: Self-pay | Admitting: Family Medicine

## 2017-05-25 NOTE — Telephone Encounter (Signed)
Caller Name:[name] Costagna Relationship to Patient:daughter Best [phone] Pharmacy:  Reason for call: daughter would like call back regarding : [facility]. They have not received notes showing why pt needs to have disability extended past Oct 16.  Daughter requests cb

## 2017-05-26 NOTE — Telephone Encounter (Signed)
Phone note was not routed back to me advising me to write the letter.  Letter written on 05/26/2017 and faxed to [place] at [phone].

## 2017-05-26 NOTE — Telephone Encounter (Signed)
Spoke with [name] and apologized for delay in letter getting faxed to [place]. I advised letter was faxed today 05/26/2017.  She also request that we fax office notes as well.  Office notes from 03/28/2017 and 04/27/2017 faxed as requested.

## 2017-05-28 NOTE — Telephone Encounter (Signed)
All should be done at this point.

## 2017-06-01 ENCOUNTER — Telehealth: Payer: Self-pay

## 2017-06-01 NOTE — Telephone Encounter (Signed)
Paula Luna (DPR signed) left v/m requesting cb with opinion of Dr Patsy Lager if he wants pt to get shingrix at pharmacy. Last annual 02/15/17.

## 2017-06-02 NOTE — Telephone Encounter (Signed)
Yes, this would be a good idea

## 2017-06-05 NOTE — Telephone Encounter (Signed)
Paula Luna aware of dr [employer].  She also wanted to know if ms Brener can get flu shot.   She stated you can just leave message on answering machine if she doesn't answer

## 2017-06-05 NOTE — Telephone Encounter (Signed)
Left message for [name] that Dr. Patsy Lager thought it would be a good idea for [name] to get the shingrix vaccine.

## 2017-06-05 NOTE — Telephone Encounter (Signed)
Left message for Paula Luna that [name] can and should get the flu shot as well.

## 2017-06-08 ENCOUNTER — Telehealth: Payer: Self-pay

## 2017-06-08 NOTE — Telephone Encounter (Signed)
[name] left vm (DPR signed); pt is getting flu vaccine at pharmacy and [name] wants to know if pt needs another pneumonia vaccine; pt had prevnar 13 on 12/25/13 and pneumovax 05/10/10. Pt is 83 y.o do you want pt to have another pneumovax? [name] does not want pt to have unless Dr Patsy Lager thinks necessary.

## 2017-06-08 NOTE — Telephone Encounter (Signed)
Thank you for notifying the patient.    In some cases, there are special caveats to pneumonia vaccine timeframes based on provider preference and patient needs. This can be confusing at times for staff.  I have educated nurses/CMA's that any time there is a question regarding vaccines to please clarify with the provider before making an assumption.

## 2017-06-08 NOTE — Telephone Encounter (Signed)
Immunization History  Administered Date(s) Administered  . Influenza Split 07/12/2012, 06/28/2013  . Influenza Whole 08/20/2007, 05/10/2010  . Influenza, High Dose Seasonal PF 08/14/2014, 07/29/2015, 05/22/2016  . Pneumococcal Conjugate-13 12/25/2013  . Pneumococcal Polysaccharide-23 05/10/2010  . Zoster 10/15/2012    Could this not have been handled at triage? Our CMA's and LPN's should know pneumonia vaccination schedules.  [name], please let them know that they do not need additional pneumonia vaccines.  Thanks. Electronically Signed  By: Hannah Beat, MD On: 06/08/2017 3:49 PM

## 2017-06-08 NOTE — Telephone Encounter (Signed)
[name] notified by telephone that Paula Luna is up to date on her pneumonia vaccines.

## 2017-06-27 ENCOUNTER — Encounter: Payer: Self-pay | Admitting: Internal Medicine

## 2017-06-27 ENCOUNTER — Telehealth: Payer: Self-pay

## 2017-06-27 ENCOUNTER — Ambulatory Visit (INDEPENDENT_AMBULATORY_CARE_PROVIDER_SITE_OTHER): Payer: PPO | Admitting: Internal Medicine

## 2017-06-27 VITALS — BP 136/78 | HR 59 | Ht 60.0 in | Wt 132.2 lb

## 2017-06-27 DIAGNOSIS — I10 Essential (primary) hypertension: Secondary | ICD-10-CM

## 2017-06-27 DIAGNOSIS — I4891 Unspecified atrial fibrillation: Secondary | ICD-10-CM | POA: Diagnosis not present

## 2017-06-27 DIAGNOSIS — I481 Persistent atrial fibrillation: Secondary | ICD-10-CM

## 2017-06-27 DIAGNOSIS — I4819 Other persistent atrial fibrillation: Secondary | ICD-10-CM

## 2017-06-27 DIAGNOSIS — R0609 Other forms of dyspnea: Secondary | ICD-10-CM | POA: Insufficient documentation

## 2017-06-27 NOTE — Patient Instructions (Addendum)
Medication Instructions:  Your physician recommends that you continue on your current medications as directed. Please refer to the Current Medication list given to you today.   Labwork: Your physician recommends that you return for lab work in: TODAY (CBC, BMP, PT/INR).   Testing/Procedures: Your physician has requested that you have an echocardiogram. Echocardiography is a painless test that uses sound waves to create images of your heart. It provides your doctor with information about the size and shape of your heart and how well your heart's chambers and valves are working. This procedure takes approximately one hour. There are no restrictions for this procedure.  Your physician has recommended that you wear a 48 HOUR holter monitor. Holter monitors are medical devices that record the heart's electrical activity. Doctors most often use these monitors to diagnose arrhythmias. Arrhythmias are problems with the speed or rhythm of the heartbeat. The monitor is a small, portable device. You can wear one while you do your normal daily activities. This is usually used to diagnose what is causing palpitations/syncope (passing out).    Follow-Up: Your physician recommends that you schedule a follow-up appointment in: 4-6 WEEKS WITH DR END OR APP.   If you need a refill on your cardiac medications before your next appointment, please call your pharmacy.     Rivaroxaban oral tablets What is this medicine? RIVAROXABAN (ri va ROX a ban) is an anticoagulant (blood thinner). It is used to treat blood clots in the lungs or in the veins. It is also used after knee or hip surgeries to prevent blood clots. It is also used to lower the chance of stroke in people with a medical condition called atrial fibrillation. This medicine may be used for other purposes; ask your health care provider or pharmacist if you have questions. COMMON BRAND NAME(S): Xarelto, Xarelto Starter Pack What should I tell my health  care provider before I take this medicine? They need to know if you have any of these conditions: -bleeding disorders -bleeding in the brain -blood in your stools (black or tarry stools) or if you have blood in your vomit -history of stomach bleeding -kidney disease -liver disease -low blood counts, like low white cell, platelet, or red cell counts -recent or planned spinal or epidural procedure -take medicines that treat or prevent blood clots -an unusual or allergic reaction to rivaroxaban, other medicines, foods, dyes, or preservatives -pregnant or trying to get pregnant -breast-feeding How should I use this medicine? Take this medicine by mouth with a glass of water. Follow the directions on the prescription label. Take your medicine at regular intervals. Do not take it more often than directed. Do not stop taking except on your doctor's advice. Stopping this medicine may increase your risk of a blood clot. Be sure to refill your prescription before you run out of medicine. If you are taking this medicine after hip or knee replacement surgery, take it with or without food. If you are taking this medicine for atrial fibrillation, take it with your evening meal. If you are taking this medicine to treat blood clots, take it with food at the same time each day. If you are unable to swallow your tablet, you may crush the tablet and mix it in applesauce. Then, immediately eat the applesauce. You should eat more food right after you eat the applesauce containing the crushed tablet. Talk to your pediatrician regarding the use of this medicine in children. Special care may be needed. Overdosage: If you think you have  taken too much of this medicine contact a poison control center or emergency room at once. NOTE: This medicine is only for you. Do not share this medicine with others. What if I miss a dose? If you take your medicine once a day and miss a dose, take the missed dose as soon as you  remember. If you take your medicine twice a day and miss a dose, take the missed dose immediately. In this instance, 2 tablets may be taken at the same time. The next day you should take 1 tablet twice a day as directed. What may interact with this medicine? Do not take this medicine with any of the following medications: -defibrotide This medicine may also interact with the following medications: -aspirin and aspirin-like medicines -certain antibiotics like erythromycin, azithromycin, and clarithromycin -certain medicines for fungal infections like ketoconazole and itraconazole -certain medicines for irregular heart beat like amiodarone, quinidine, dronedarone -certain medicines for seizures like carbamazepine, phenytoin -certain medicines that treat or prevent blood clots like warfarin, enoxaparin, and dalteparin -conivaptan -diltiazem -felodipine -indinavir -lopinavir; ritonavir -NSAIDS, medicines for pain and inflammation, like ibuprofen or naproxen -ranolazine -rifampin -ritonavir -SNRIs, medicines for depression, like desvenlafaxine, duloxetine, levomilnacipran, venlafaxine -SSRIs, medicines for depression, like citalopram, escitalopram, fluoxetine, fluvoxamine, paroxetine, sertraline -St. John's wort -verapamil This list may not describe all possible interactions. Give your health care provider a list of all the medicines, herbs, non-prescription drugs, or dietary supplements you use. Also tell them if you smoke, drink alcohol, or use illegal drugs. Some items may interact with your medicine. What should I watch for while using this medicine? Visit your doctor or health care professional for regular checks on your progress. Notify your doctor or health care professional and seek emergency treatment if you develop breathing problems; changes in vision; chest pain; severe, sudden headache; pain, swelling, warmth in the leg; trouble speaking; sudden numbness or weakness of the face,  arm or leg. These can be signs that your condition has gotten worse. If you are going to have surgery or other procedure, tell your doctor that you are taking this medicine. What side effects may I notice from receiving this medicine? Side effects that you should report to your doctor or health care professional as soon as possible: -allergic reactions like skin rash, itching or hives, swelling of the face, lips, or tongue -back pain -redness, blistering, peeling or loosening of the skin, including inside the mouth -signs and symptoms of bleeding such as bloody or black, tarry stools; red or dark-brown urine; spitting up blood or brown material that looks like coffee grounds; red spots on the skin; unusual bruising or bleeding from the eye, gums, or nose Side effects that usually do not require medical attention (report to your doctor or health care professional if they continue or are bothersome): -dizziness -muscle pain This list may not describe all possible side effects. Call your doctor for medical advice about side effects. You may report side effects to FDA at 1-800-FDA-1088. Where should I keep my medicine? Keep out of the reach of children. Store at room temperature between 15 and 30 degrees C (59 and 86 degrees F). Throw away any unused medicine after the expiration date. NOTE: This sheet is a summary. It may not cover all possible information. If you have questions about this medicine, talk to your doctor, pharmacist, or health care provider.  2018 [employer] Standard (2016-05-18 16:29:33)      Apixaban oral tablets What is this medicine? APIXABAN (a PIX a ban)  is an anticoagulant (blood thinner). It is used to lower the chance of stroke in people with a medical condition called atrial fibrillation. It is also used to treat or prevent blood clots in the lungs or in the veins. This medicine may be used for other purposes; ask your health care provider or pharmacist if you have  questions. COMMON BRAND NAME(S): Eliquis What should I tell my health care provider before I take this medicine? They need to know if you have any of these conditions: -bleeding disorders -bleeding in the brain -blood in your stools (black or tarry stools) or if you have blood in your vomit -history of stomach bleeding -kidney disease -liver disease -mechanical heart valve -an unusual or allergic reaction to apixaban, other medicines, foods, dyes, or preservatives -pregnant or trying to get pregnant -breast-feeding How should I use this medicine? Take this medicine by mouth with a glass of water. Follow the directions on the prescription label. You can take it with or without food. If it upsets your stomach, take it with food. Take your medicine at regular intervals. Do not take it more often than directed. Do not stop taking except on your doctor's advice. Stopping this medicine may increase your risk of a blot clot. Be sure to refill your prescription before you run out of medicine. Talk to your pediatrician regarding the use of this medicine in children. Special care may be needed. Overdosage: If you think you have taken too much of this medicine contact a poison control center or emergency room at once. NOTE: This medicine is only for you. Do not share this medicine with others. What if I miss a dose? If you miss a dose, take it as soon as you can. If it is almost time for your next dose, take only that dose. Do not take double or extra doses. What may interact with this medicine? This medicine may interact with the following: -aspirin and aspirin-like medicines -certain medicines for fungal infections like ketoconazole and itraconazole -certain medicines for seizures like carbamazepine and phenytoin -certain medicines that treat or prevent blood clots like warfarin, enoxaparin, and dalteparin -clarithromycin -NSAIDs, medicines for pain and inflammation, like ibuprofen or  naproxen -rifampin -ritonavir -St. John's wort This list may not describe all possible interactions. Give your health care provider a list of all the medicines, herbs, non-prescription drugs, or dietary supplements you use. Also tell them if you smoke, drink alcohol, or use illegal drugs. Some items may interact with your medicine. What should I watch for while using this medicine? Visit your doctor or health care professional for regular checks on your progress. Notify your doctor or health care professional and seek emergency treatment if you develop breathing problems; changes in vision; chest pain; severe, sudden headache; pain, swelling, warmth in the leg; trouble speaking; sudden numbness or weakness of the face, arm or leg. These can be signs that your condition has gotten worse. If you are going to have surgery or other procedure, tell your doctor that you are taking this medicine. What side effects may I notice from receiving this medicine? Side effects that you should report to your doctor or health care professional as soon as possible: -allergic reactions like skin rash, itching or hives, swelling of the face, lips, or tongue -signs and symptoms of bleeding such as bloody or black, tarry stools; red or dark-brown urine; spitting up blood or brown material that looks like coffee grounds; red spots on the skin; unusual bruising or bleeding from  the eye, gums, or nose This list may not describe all possible side effects. Call your doctor for medical advice about side effects. You may report side effects to FDA at 1-800-FDA-1088. Where should I keep my medicine? Keep out of the reach of children. Store at room temperature between 20 and 25 degrees C (68 and 77 degrees F). Throw away any unused medicine after the expiration date. NOTE: This sheet is a summary. It may not cover all possible information. If you have questions about this medicine, talk to your doctor, pharmacist, or health care  provider.  2018 [employer] Standard (2016-03-21 11:54:23)    Warfarin tablets What is this medicine? WARFARIN (WAR far in) is an anticoagulant. It is used to treat or prevent clots in the veins, arteries, lungs, or heart. This medicine may be used for other purposes; ask your health care provider or pharmacist if you have questions. COMMON BRAND NAME(S): Coumadin, Jantoven What should I tell my health care provider before I take this medicine? They need to know if you have any of these conditions: -alcoholism -anemia -bleeding disorders -cancer -diabetes -heart disease -high blood pressure -history of bleeding in the gastrointestinal tract -history of stroke or other brain injury or disease -kidney or liver disease -protein C deficiency -protein S deficiency -psychosis or dementia -recent injury, recent or planned surgery or procedure -an unusual or allergic reaction to warfarin, other medicines, foods, dyes, or preservatives -pregnant or trying to get pregnant -breast-feeding How should I use this medicine? Take this medicine by mouth with a glass of water. Follow the directions on the prescription label. You can take this medicine with or without food. Take your medicine at the same time each day. Do not take it more often than directed. Do not stop taking except on your doctor's advice. Stopping this medicine may increase your risk of a blood clot. Be sure to refill your prescription before you run out of medicine. If your doctor or healthcare professional calls to change your dose, write down the dose and any other instructions. Always read the dose and instructions back to him or her to make sure you understand them. Tell your doctor or healthcare professional what strength of tablets you have on hand. Ask how many tablets you should take to equal your new dose. Write the date on the new instructions and keep them near your medicine. If you are told to stop taking your  medicine until your next blood test, call your doctor or healthcare professional if you do not hear anything within 24 hours of the test to find out your new dose or when to restart your prior dose. A special MedGuide will be given to you by the pharmacist with each prescription and refill. Be sure to read this information carefully each time. Talk to your pediatrician regarding the use of this medicine in children. Special care may be needed. Overdosage: If you think you have taken too much of this medicine contact a poison control center or emergency room at once. NOTE: This medicine is only for you. Do not share this medicine with others. What if I miss a dose? It is important not to miss a dose. If you miss a dose, call your healthcare provider. Take the dose as soon as possible on the same day. If it is almost time for your next dose, take only that dose. Do not take double or extra doses to make up for a missed dose. What may interact with this medicine? Do  not take this medicine with any of the following medications: -agents that prevent or dissolve blood clots -aspirin or other salicylates -danshen -dextrothyroxine -mifepristone -St. John's Wort -red yeast rice This medicine may also interact with the following medications: -acetaminophen -agents that lower cholesterol -alcohol -allopurinol -amiodarone -antibiotics or medicines for treating bacterial, fungal or viral infections -azathioprine -barbiturate medicines for inducing sleep or treating seizures -certain medicines for diabetes -certain medicines for heart rhythm problems -certain medicines for hepatitis C virus infections like daclatasvir, dasabuvir; ombitasvir; paritaprevir; ritonavir, elbasvir; grazoprevir, ledipasvir; sofosbuvir, simeprevir, sofosbuvir, sofosbuvir; velpatasvir, sofosbuvir; velpatasvir; voxilaprevir -certain medicines for high blood pressure -chloral  hydrate -cisapride -conivaptan -disulfiram -female hormones, including contraceptive or birth control pills -general anesthetics -herbal or dietary products like garlic, ginkgo, ginseng, green tea, or kava kava -influenza virus vaccine -female hormones -medicines for mental depression or psychosis -medicines for some types of cancer -medicines for stomach problems -methylphenidate -NSAIDs, medicines for pain and inflammation, like ibuprofen or naproxen -propoxyphene -quinidine, quinine -raloxifene -seizure or epilepsy medicine like carbamazepine, phenytoin, and valproic acid -steroids like cortisone and prednisone -tamoxifen -thyroid medicine -tramadol -vitamin c, vitamin e, and vitamin K -zafirlukast -zileuton This list may not describe all possible interactions. Give your health care provider a list of all the medicines, herbs, non-prescription drugs, or dietary supplements you use. Also tell them if you smoke, drink alcohol, or use illegal drugs. Some items may interact with your medicine. What should I watch for while using this medicine? Visit your doctor or health care professional for regular checks on your progress. You will need to have a blood test called a PT/INR regularly. The PT/INR blood test is done to make sure you are getting the right dose of this medicine. It is important to not miss your appointment for the blood tests. When you first start taking this medicine, these tests are done often. Once the correct dose is determined and you take your medicine properly, these tests can be done less often. Notify your doctor or health care professional and seek emergency treatment if you develop breathing problems; changes in vision; chest pain; severe, sudden headache; pain, swelling, warmth in the leg; trouble speaking; sudden numbness or weakness of the face, arm or leg. These can be signs that your condition has gotten worse. While you are taking this medicine, carry an  identification card with your name, the name and dose of medicine(s) being used, and the name and phone number of your doctor or health care professional or person to contact in an emergency. Do not start taking or stop taking any medicines or over-the-counter medicines except on the advice of your doctor or health care professional. You should discuss your diet with your doctor or health care professional. Do not make major changes in your diet. Vitamin K can affect how well this medicine works. Many foods contain vitamin K. It is important to eat a consistent amount of foods with vitamin K. Other foods with vitamin K that you should eat in consistent amounts are asparagus, basil, black eyed peas, broccoli, brussel sprouts, cabbage, green onions, green tea, parsley, green leafy vegetables like beet greens, collard greens, kale, spinach, turnip greens, or certain lettuces like green leaf or romaine. This medicine can cause birth defects or bleeding in an unborn child. Women of childbearing age should use effective birth control while taking this medicine. If a woman becomes pregnant while taking this medicine, she should discuss the potential risks and her options with her health care professional. Avoid  sports and activities that might cause injury while you are using this medicine. Severe falls or injuries can cause unseen bleeding. Be careful when using sharp tools or knives. Consider using an Neurosurgeon. Take special care brushing or flossing your teeth. Report any injuries, bruising, or red spots on the skin to your doctor or health care professional. If you have an illness that causes vomiting, diarrhea, or fever for more than a few days, contact your doctor. Also check with your doctor if you are unable to eat for several days. These problems can change the effect of this medicine. Even after you stop taking this medicine, it takes several days before your body recovers its normal ability to clot  blood. Ask your doctor or health care professional how long you need to be careful. If you are going to have surgery or dental work, tell your doctor or health care professional that you have been taking this medicine. What side effects may I notice from receiving this medicine? Side effects that you should report to your doctor or health care professional as soon as possible: -allergic reactions like skin rash, itching or hives, swelling of the face, lips, or tongue -breathing problems -chest pain -dizziness -headache -heavy menstrual bleeding or vaginal bleeding -pain in the lower back or side -painful, blue or purple toes -painful skin ulcers that do not go away -signs and symptoms of bleeding such as bloody or black, tarry stools; red or dark-brown urine; spitting up blood or brown material that looks like coffee grounds; red spots on the skin; unusual bruising or bleeding from the eye, gums, or nose -stomach pain -unusually weak or tired Side effects that usually do not require medical attention (report to your doctor or health care professional if they continue or are bothersome): -diarrhea -hair loss This list may not describe all possible side effects. Call your doctor for medical advice about side effects. You may report side effects to FDA at 1-800-FDA-1088. Where should I keep my medicine? Keep out of the reach of children. Store at room temperature between 15 and 30 degrees C (59 and 86 degrees F). Protect from light. Throw away any unused medicine after the expiration date. Do not flush down the toilet. NOTE: This sheet is a summary. It may not cover all possible information. If you have questions about this medicine, talk to your doctor, pharmacist, or health care provider.  2018 [employer] (2016-08-18 11:27:41)

## 2017-06-27 NOTE — Telephone Encounter (Signed)
[name] left v/m; pt was seen by card today and EKG was done and pt will go back end of month for monitor put on for 2 days. Cardiologist does not think pt can return to work yet; [name] request note sent to [facility] that pt needs extension about returning to work.Please advise.

## 2017-06-27 NOTE — Progress Notes (Signed)
New Outpatient Visit Date: 06/27/2017  Referring Provider: Hannah Beat, MD [address] [place], [place] [address]  Chief Complaint: Atrial fibrillation  HPI:  Paula Luna is a 83 y.o. female who is being seen today for the evaluation of atrial fibrillation at the request of Dr. Patsy Lager. She has a history of hypertension and recent ischemic bowel secondary to internal hernia. She was admitted in early July with abdominal pain and was found to have an internal hernia and ischemic jejunum for which she underwent exploratory laparotomy and small bowel resection. Upon presentation to the ED, the patient was noted to be in atrial fibrillation, though further workup was not performed at that time. She recovered well from the surgery but was noted to be hypertensive and not feeling well at her second follow-up was with Dr. Everlene Farrier. She was referred to her PCPs office and was evaluated by Dr. Milinda Antis, who again found atrial fibrillation by EKG.  Today, Paula Luna reports that she is feeling well with the exception of some constipation and bowel irregularity. She also has exertional dyspnea when going up a flight of stairs, which is been present ever since her abdominal surgery this summer. She notes lightheadedness when straining to have a bowel movement but otherwise has not felt dizzy. She denies chest pain and palpitations. She has not had any significant bleeding. She denies focal neurologic deficits.  Paula Luna, Paula her left shoulder. She has not had any falls since then.  --------------------------------------------------------------------------------------------------  Cardiovascular History & Procedures: Cardiovascular Problems:  Persistent atrial fibrillation  Risk Factors:  Hypertension and age greater than 64  Cath/PCI:  None  CV Surgery:  None  EP Procedures and Devices:  None  Non-Invasive Evaluation(s):  None  Recent CV  Pertinent Labs: Lab Results  Component Value Date   CHOL 163 01/23/2017   HDL 62.30 01/23/2017   LDLCALC 90 01/23/2017   LDLDIRECT 101.2 05/20/2009   TRIG 75 03/21/2017   CHOLHDL 3 01/23/2017   K 4.8 04/26/2017   MG 2.2 03/19/2017   BUN 13 04/26/2017   CREATININE 0.81 04/26/2017    --------------------------------------------------------------------------------------------------  Past Medical History:  Diagnosis Date  . A-fib (HCC)   . Anemia   . Back pain   . Hypertension 12/25/2013  . Ischemic bowel disease (HCC)   . Macular degeneration of left eye 01/06/2015  . Osteoporosis     Past Surgical History:  Procedure Laterality Date  . CLOSED REDUCTION PROXIMAL HUMERUS FRACTURE Left 2017  . LAPAROSCOPIC SMALL BOWEL RESECTION    . LAPAROTOMY N/A 03/18/2017   Procedure: EXPLORATORY LAPAROTOMY, SMALL BOWEL RESECTION;  Surgeon: Leafy Ro, MD;  Location: [facility] ORS;  Service: General;  Laterality: N/A;  . TOTAL ABDOMINAL HYSTERECTOMY      Current Meds  Medication Sig  . docusate sodium (COLACE) 100 MG capsule Take 100 mg by mouth daily as needed for mild constipation.  Marland Kitchen lisinopril (PRINIVIL,ZESTRIL) 10 MG tablet TAKE 1 TABLET BY MOUTH ONCE DAILY  . Multiple Vitamins-Minerals (ICAPS) CAPS Take 1 capsule by mouth daily.    Allergies: Latex; Tetanus toxoid; and Alendronate sodium  Social History   Social History  . Marital status: Married    Spouse name: N/A  . Number of children: N/A  . Years of education: N/A   Occupational History  . 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 Smoking status: Former Games developer  . Smokeless  tobacco: Never Used  . Alcohol use No  . Drug use: No  . Sexual activity: No   Other Topics Concern  . Not on file   Social History Narrative   Widowed after 64 years   Working some at [employer] in [employer] with daughter    Family History  Problem Relation Age of Onset  . Diabetes Mother     . Cancer Father        jaw    Review of Systems: A 12-system review of systems was performed and was negative except as noted in the HPI.  --------------------------------------------------------------------------------------------------  Physical Exam: BP 136/78 (BP Location: Right Arm, Patient Position: Sitting, Cuff Size: Normal)   Pulse (!) 59   Ht 5' (1.524 m)   Wt 132 lb 4 oz (60 kg)   BMI 25.83 kg/m   General:  Well-developed, well-nourished woman seated comfortably in the exam room. She appears younger than her stated age and is accompanied by her daughter. HEENT: No conjunctival pallor or scleral icterus. Moist mucous membranes. OP clear. Neck: Supple without lymphadenopathy, thyromegaly, JVD, or HJR. No carotid bruit. Lungs: Normal work of breathing. Clear to auscultation bilaterally without wheezes or crackles. Heart: Irregularly irregular without murmurs or rubs. Non-displaced PMI. Abd: Bowel sounds present. Soft, NT/ND without hepatosplenomegaly Ext: No lower extremity edema. Radial, PT, and DP pulses are 2+ bilaterally Skin: Warm and dry without rash. Neuro: CNIII-XII intact. Strength and fine-touch sensation intact in upper and lower extremities bilaterally. Psych: Normal mood and affect.  EKG:  Atrial fibrillation with slow ventricular response (ventricular rate 59 bpm). Poor R-wave progression.  Lab Results  Component Value Date   WBC 6.9 04/26/2017   HGB 11.5 (L) 04/26/2017   HCT 36.8 04/26/2017   MCV 78.4 04/26/2017   PLT 241.0 04/26/2017    Lab Results  Component Value Date   NA 142 04/26/2017   K 4.8 04/26/2017   CL 106 04/26/2017   CO2 29 04/26/2017   BUN 13 04/26/2017   CREATININE 0.81 04/26/2017   GLUCOSE 130 (H) 04/26/2017   ALT 7 04/26/2017    Lab Results  Component Value Date   CHOL 163 01/23/2017   HDL 62.30 01/23/2017   LDLCALC 90 01/23/2017   LDLDIRECT 101.2 05/20/2009   TRIG 75 03/21/2017   CHOLHDL 3 01/23/2017      --------------------------------------------------------------------------------------------------  ASSESSMENT AND PLAN: Persistent atrial fibrillation Atrial fibrillation has presumably been present since July. Ventricular rate is normal to slightly low without any AV nodal blocking agents. CHADSVASC score is at least 10 (age 59, female, and hypertension). I have recommended that Paula Luna begin therapeutic anticoagulation. We have discussed options, including warfarin and NOAC's. Paula Luna would like to speak with her insurance regarding the cost of these agents before making a decision. I have encouraged her to contact us within a week so that we can initiate appropriate therapy. In anticipation of this, we will check a CBC, BMP, and INR today. I will also check a transthoracic echocardiogram and 48-hour Holter monitor to evaluate for structural abnormalities and to assess her heart rate response. We will discuss continued rate control versus rhythm control strategies when the patient returns for follow-up and has been appropriately anticoagulated.  Dyspnea on exertion This is been present for a least a few months. Ms. Woodfin appears euvolemic on exam. Symptoms may be related to her underlying atrial fibrillation. However, given her age and hypertension, think it is important to  exclude structural abnormalities. We will obtain an echo, as above. Ms. 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 However, her dyspnea currently precludes this.  Hypertension: Blood pressure is borderline elevated today. I will not make any medication changes at this time.  Follow-up: Return to clinic in 4-6 weeks.  Yvonne Kendall, MD 06/27/2017 9:24 PM

## 2017-06-28 ENCOUNTER — Telehealth: Payer: Self-pay | Admitting: Family Medicine

## 2017-06-28 ENCOUNTER — Telehealth: Payer: Self-pay | Admitting: Internal Medicine

## 2017-06-28 LAB — CBC WITH DIFFERENTIAL/PLATELET
BASOS ABS: 0 10*3/uL (ref 0.0–0.2)
Basos: 0 %
EOS (ABSOLUTE): 0.2 10*3/uL (ref 0.0–0.4)
Eos: 2 %
HEMOGLOBIN: 12 g/dL (ref 11.1–15.9)
Hematocrit: 38.6 % (ref 34.0–46.6)
Immature Grans (Abs): 0 10*3/uL (ref 0.0–0.1)
Immature Granulocytes: 0 %
LYMPHS ABS: 2.5 10*3/uL (ref 0.7–3.1)
LYMPHS: 35 %
MCH: 24 pg — AB (ref 26.6–33.0)
MCHC: 31.1 g/dL — AB (ref 31.5–35.7)
MCV: 77 fL — ABNORMAL LOW (ref 79–97)
MONOCYTES: 9 %
Monocytes Absolute: 0.6 10*3/uL (ref 0.1–0.9)
NEUTROS ABS: 3.9 10*3/uL (ref 1.4–7.0)
Neutrophils: 54 %
PLATELETS: 230 10*3/uL (ref 150–379)
RBC: 4.99 x10E6/uL (ref 3.77–5.28)
RDW: 17.2 % — ABNORMAL HIGH (ref 12.3–15.4)
WBC: 7.2 10*3/uL (ref 3.4–10.8)

## 2017-06-28 LAB — BASIC METABOLIC PANEL
BUN/Creatinine Ratio: 15 (ref 12–28)
BUN: 13 mg/dL (ref 10–36)
CALCIUM: 9.3 mg/dL (ref 8.7–10.3)
CO2: 23 mmol/L (ref 20–29)
CREATININE: 0.85 mg/dL (ref 0.57–1.00)
Chloride: 106 mmol/L (ref 96–106)
GFR calc Af Amer: 70 mL/min/{1.73_m2} (ref >59)
GFR, EST NON AFRICAN AMERICAN: 61 mL/min/{1.73_m2} (ref >59)
GLUCOSE: 100 mg/dL — AB (ref 65–99)
POTASSIUM: 4.5 mmol/L (ref 3.5–5.2)
Sodium: 144 mmol/L (ref 134–144)

## 2017-06-28 LAB — PROTIME-INR
INR: 1 (ref 0.8–1.2)
PROTHROMBIN TIME: 10.6 s (ref 9.1–12.0)

## 2017-06-28 NOTE — Telephone Encounter (Signed)
Clarified with Martie LeeSabrina that patient's daughter does not need to sign any release. Pt aware.

## 2017-06-28 NOTE — Telephone Encounter (Signed)
Pt daughter, Brunetta Jeansnn Catagna, called to discuss ppw that needs to be filled out for Pt's disability ppw by [employer]. She was told [employer] needs office notes and Dewayne Hatchnn is requesting a cb [phone].

## 2017-06-28 NOTE — Telephone Encounter (Signed)
Reasonable again as prior. 83 yo getting some SOB and cardiac work-up.  Can you send another letter for her keeping out of work until next cardiology appt.

## 2017-06-28 NOTE — Telephone Encounter (Signed)
Pt daughter, Dewayne Hatchnn is returning your call regarding labwork, daughter, Dewayne Hatchnn is on [place].

## 2017-06-28 NOTE — Telephone Encounter (Signed)
Reviewed recent lab results w/pt's daughter, Dewayne Hatchnn, (on [place]). Pt is awaiting clearance to return to work at her [employer] job. PCP faxed letter to [facility] today stating pt needs to continue to be out of work until cleared by cardiology. She has an upcoming holter monitor placement and echocardiogram. F/u appt is November 21. Daughter would like to come by and sign a release for us to send [facility]W paperwork [facility] when appropriate. Notified front desk.

## 2017-06-28 NOTE — Telephone Encounter (Signed)
Letter written and faxed to [employer] at [phone].  Left message for Dewayne Hatchnn that letter has been faxed as requested.

## 2017-06-30 ENCOUNTER — Telehealth: Payer: Self-pay | Admitting: Family Medicine

## 2017-06-30 NOTE — Telephone Encounter (Signed)
I do not see that pt is on blood thinner med;I called [phone] and left v/m requesting cb.

## 2017-06-30 NOTE — Telephone Encounter (Signed)
Dewayne Hatchnn called wanting to talk to traige but didn't want to leave message its about pt blood thiner meds  Best number [phone]

## 2017-06-30 NOTE — Telephone Encounter (Signed)
Dewayne Hatchnn said that cardiology still has testing to do for pt but cards spoke with Dewayne HatchAnn and pt about going on a blood thinning med such as warfarin, xarelto or eliquis. Pt values Dr Copland's opinion and wants to know if Dr Patsy Lageropland thinks would be beneficial for pt to go on one of these meds and if so which med would Dr Copland recommend for pt. Dewayne Hatchnn understands it will be next week when receives cb.Please advise.

## 2017-06-30 NOTE — Telephone Encounter (Signed)
Spoke with ann (daughter)  She stated [employer] is sending paperwork to dr ends office to fill out paperwork Advise ann dr ends to see what the fmla is going to be extending

## 2017-07-04 NOTE — Telephone Encounter (Signed)
Copied from CRM #[id number]. Topic: Inquiry >> Jul 03, 2017  5:25 PM Alexander BergeronBarksdale, Harvey B wrote: Reason for CRM: daughter called for PT to go over some info(info not disclosed) but please contact daughter around 1pm is possible >> Jul 04, 2017  4:13 PM Raquel SarnaHayes, Teresa G wrote:

## 2017-07-04 NOTE — Telephone Encounter (Signed)
I called the patient's daughter to discuss yesterday, but she was unable to discuss secondary to it being in the middle of her work shift.  I will attempt to call her again.

## 2017-07-05 NOTE — Telephone Encounter (Signed)
15 minute conversation. The patient's daughter asked me multiple questions about anticoagulation, Coumadin, Eliquis, and Xarelto. Her strength has improved. She has a Holter monitor upcoming. She is seeing cardiology. An echocardiogram is upcoming. I recommended that looking at the potential benefits and risks, anticoagulation would be appropriate in this case. Ultimately the patient can decide her own choices in life.

## 2017-07-06 ENCOUNTER — Telehealth: Payer: Self-pay | Admitting: Internal Medicine

## 2017-07-06 ENCOUNTER — Telehealth: Payer: Self-pay | Admitting: Family Medicine

## 2017-07-06 NOTE — Telephone Encounter (Signed)
Forms were faxed to Dr. Okey DupreEnd (cardiologist) yesterday to complete.

## 2017-07-06 NOTE — Telephone Encounter (Signed)
Sent to me in error - cardiology has requested she stay out of work and has f/u with her in 1 week

## 2017-07-06 NOTE — Telephone Encounter (Signed)
Return to Work Certificate in Dr. Patsy Lageropland INbox for completion and signature.  Return to Robin please

## 2017-07-06 NOTE — Telephone Encounter (Signed)
Received records request [employer], [facility].

## 2017-07-06 NOTE — Telephone Encounter (Signed)
Paula Luna,  Have we received the fax from Dr. Cyndie Chimeopland's office? It looks like [name] is scheduled to see Paula Luna on 11/21 for reevaluation. Thanks.  Thayer Ohmhris

## 2017-07-07 ENCOUNTER — Telehealth: Payer: Self-pay | Admitting: Internal Medicine

## 2017-07-07 NOTE — Telephone Encounter (Signed)
It looks like we received forms [facility] yesterday and have sent them to [facility].   Lupita LeashDonna, Were the forms you were faxing us [employer]? If different, I have not yet received anything from you office and could you re-send?  Thanks so much!

## 2017-07-07 NOTE — Telephone Encounter (Signed)
Yes,  Forms were from [employer].

## 2017-07-07 NOTE — Telephone Encounter (Signed)
S/w patient and patient's daughter. Patient is leaning towards wanting to start on Eliquis or Xarelto. Patient wants to wait to make that decision until she gets the Holter monitor and results. They have contacted her medication insurance coverage and have found out the cost of the medications. I will provide them with patient assistance applications for each medication on Tuesday when she comes to place the monitor. Daughter is stating she is letting her mother(patient) make these decisions and she would like to wait. They are also aware patient's short-term disability forms have been [facility]. Routing to Dr End to make him aware.

## 2017-07-07 NOTE — Telephone Encounter (Signed)
PT daughter Dewayne Hatchnn calling  Dr. Okey DupreEnd wanted a decision about what medication option to take PT knows she does not want the coumadin option They are deciding between Eliquis and Xarelto but would like to wait for Holter appt to make decision They are interested to find what assistance options are available for each Please call to advise  PT daughter also stated that they received an extension until Nov 11th for Short Term Disability

## 2017-07-07 NOTE — Telephone Encounter (Signed)
Thank you for the update. The monitor will be helpful to establish heart rate control but will not affect my recommendation for anticoagulation, given previously documented atrial fibrillation. I think it would be best to start anticoagulation as soon as possible, but I will respect Ms. Byer's request.  Yvonne Kendallhristopher Troye Hiemstra, MD [facility] Pager: [phone]

## 2017-07-07 NOTE — Telephone Encounter (Signed)
S/w patient's daughter and she verbalized understanding of recommendations. Daughter was very Adult nurseappreciative.

## 2017-07-11 ENCOUNTER — Encounter (INDEPENDENT_AMBULATORY_CARE_PROVIDER_SITE_OTHER): Payer: PPO

## 2017-07-11 ENCOUNTER — Telehealth: Payer: Self-pay | Admitting: *Deleted

## 2017-07-11 DIAGNOSIS — I4819 Other persistent atrial fibrillation: Secondary | ICD-10-CM

## 2017-07-11 DIAGNOSIS — I481 Persistent atrial fibrillation: Secondary | ICD-10-CM | POA: Diagnosis not present

## 2017-07-11 MED ORDER — RIVAROXABAN 20 MG PO TABS: 20.0000 mg | ORAL_TABLET | Freq: Every day | ORAL | 3 refills | Status: DC

## 2017-07-11 NOTE — Telephone Encounter (Signed)
Called and spoke to patient's daughter, Dewayne Hatchnn. She verbalized understanding of medication. Rx sent to verified pharmacy. Patient/daughter will fill out their portion of patient assistance application.  Physician portion of application placed in Dr. Serita KyleEnd's basket for him to sign and complete for faxing.

## 2017-07-11 NOTE — Telephone Encounter (Signed)
I think rivaroxaban is a reasonable choice for Paula Luna.  Given GFR greater than 50 on BMP, I recommend beginning rivaroxaban 20 mg daily with dinner.  Yvonne Kendallhristopher Hamilton Marinello, MD [facility] Pager: [phone]) [phone]

## 2017-07-11 NOTE — Telephone Encounter (Signed)
Patient and daughter here in office today for holter monitor placement.  Patient has decided she would like to use Xarelto for her choice of medication for anticoagulation.  Patient assistance forms provided to patient and daughter.  Routing to Dr End for evaluation.

## 2017-07-12 NOTE — Telephone Encounter (Signed)
Physician portion of paperwork faxed to Xarel[employer].

## 2017-07-13 ENCOUNTER — Other Ambulatory Visit: Payer: Self-pay

## 2017-07-13 ENCOUNTER — Ambulatory Visit (INDEPENDENT_AMBULATORY_CARE_PROVIDER_SITE_OTHER): Payer: PPO

## 2017-07-13 DIAGNOSIS — I481 Persistent atrial fibrillation: Secondary | ICD-10-CM

## 2017-07-13 DIAGNOSIS — I4819 Other persistent atrial fibrillation: Secondary | ICD-10-CM

## 2017-07-13 NOTE — Telephone Encounter (Signed)
PT and PT daughter Dewayne Hatchnn stated that as they began the paperwork they realized she would not qualify. They are no longer interested in filing for the medication assistance

## 2017-07-13 NOTE — Telephone Encounter (Signed)
No answer. Left message to call back if has any further questions regarding medication and assistance in receiving medication and possible assistance programs.

## 2017-07-17 ENCOUNTER — Telehealth: Payer: Self-pay | Admitting: Internal Medicine

## 2017-07-17 NOTE — Telephone Encounter (Signed)
Daughter calling back. We reviewed the echo results and she verbalized understanding. She also set-up patient's MyChart so I released the results to there as well. Patient is also going to begin taking the Xarelto tonight with dinner.

## 2017-07-17 NOTE — Telephone Encounter (Signed)
Pt daughter Dewayne Hatchnn calling to recap the results from ECHO Please call to discuss

## 2017-07-18 ENCOUNTER — Telehealth: Payer: Self-pay | Admitting: Internal Medicine

## 2017-07-18 NOTE — Telephone Encounter (Signed)
Placed in Dr. Serita KyleEnd's basket to complete.

## 2017-07-18 NOTE — Telephone Encounter (Signed)
Received paper work from [facility]  Marked as urgent  Placed in nurses bin

## 2017-07-19 NOTE — Telephone Encounter (Signed)
Paperwork completed.  Patient can return to work following visit with Ward Givenshris Berge, NP, on 08/02/17.  If she is not feeling well at that time, further delay in her return to work will need to be discussed.  Paula Kendallhristopher Brentton Wardlow, MD [facility] Pager: [phone]) [phone]

## 2017-07-19 NOTE — Telephone Encounter (Signed)
Paperwork given to front staff for return to [facility].

## 2017-07-19 NOTE — Telephone Encounter (Signed)
SENT COMPLETED FORMS TO [employer] VIA INTEROFFICE 

## 2017-07-20 ENCOUNTER — Ambulatory Visit
Admission: RE | Admit: 2017-07-20 | Discharge: 2017-07-20 | Disposition: A | Discharge status: Home / Self Care | Payer: PPO | Source: Ambulatory Visit | Attending: Internal Medicine | Admitting: Internal Medicine

## 2017-07-20 ENCOUNTER — Telehealth: Payer: Self-pay | Admitting: Internal Medicine

## 2017-07-20 DIAGNOSIS — I481 Persistent atrial fibrillation: Secondary | ICD-10-CM | POA: Diagnosis not present

## 2017-07-20 NOTE — Telephone Encounter (Signed)
Holter monitor is currently not available in my queue of studies to read.  It will be read once available.  Yvonne Kendallhristopher Blessing Zaucha, MD [facility] Pager: [phone]) [phone]

## 2017-07-20 NOTE — Telephone Encounter (Signed)
Patient daughter Dewayne Hatchnn calling for results of Holter monitor  Please call to discuss

## 2017-07-20 NOTE — Telephone Encounter (Signed)
Spoke with patients daughter per release form. Reviewed that monitor was uploaded today and still pending review by physician. Advised that we would call her once results are available. She was appreciative for the call and had no further questions at this time.

## 2017-07-26 ENCOUNTER — Telehealth: Payer: Self-pay

## 2017-07-26 NOTE — Telephone Encounter (Signed)
Copied from CRM [phone]. Topic: Quick Communication - See Telephone Encounter >> Jul 26, 2017  2:42 PM Landry MellowFoltz, Melissa J wrote: CRM for notification. See Telephone encounter for:   07/26/17.pt asking for return call from dr Copland regarding testing that was done by the cardiologist. Please call [name] at [phone]

## 2017-07-26 NOTE — Telephone Encounter (Deleted)
Copied from CRM #[id number]. Topic: Quick Communication - See Telephone Encounter >> Jul 26, 2017  2:42 PM Foltz, Melissa J wrote: CRM for notification. See Telephone encounter for:   07/26/17.pt asking for return call from dr Copland regarding testing that was done by the cardiologist. Please call [name] at [phone] 

## 2017-07-26 NOTE — Telephone Encounter (Signed)
It looks like cardiology just spoke to her about results with close follow-up early next week face to face. I would defer to Dr. Okey DupreEnd - does she have a specific question?

## 2017-07-27 NOTE — Telephone Encounter (Signed)
Left message for Dewayne Hatchnn to return my call.  Ok to [facility] Triage Nurse to get specific questions daughter has for Dr. Patsy Lageropland.

## 2017-07-27 NOTE — Telephone Encounter (Signed)
Pt's daughter called regarding pt's concern for Dr. Serita KyleEnd's recommendation for possible "cardiac conversion"  Pt is wanting Dr. Cyndie Chimeopland's advice on if she should have the procedure or not. Daughter said she values Dr. Patsy Lageropland 's opinion.

## 2017-07-27 NOTE — Telephone Encounter (Signed)
Attempted to call. No answer. I do not really know this data, and further discussion with her cardiologist probably makes sense.

## 2017-07-29 NOTE — Telephone Encounter (Signed)
I called again. No answer. I LMOM - I think this is an issue best addressed with cardiology. I am not sure how much I can add.

## 2017-08-02 ENCOUNTER — Encounter: Payer: Self-pay | Admitting: Nurse Practitioner

## 2017-08-02 ENCOUNTER — Ambulatory Visit (INDEPENDENT_AMBULATORY_CARE_PROVIDER_SITE_OTHER): Payer: PPO | Admitting: Nurse Practitioner

## 2017-08-02 VITALS — BP 154/80 | HR 74 | Ht 60.0 in | Wt 133.0 lb

## 2017-08-02 DIAGNOSIS — I4819 Other persistent atrial fibrillation: Secondary | ICD-10-CM

## 2017-08-02 DIAGNOSIS — I481 Persistent atrial fibrillation: Secondary | ICD-10-CM

## 2017-08-02 DIAGNOSIS — I10 Essential (primary) hypertension: Secondary | ICD-10-CM | POA: Diagnosis not present

## 2017-08-02 NOTE — Progress Notes (Signed)
Office Visit    Patient Name: Paula Luna Date of Encounter: 08/02/2017  Primary Care Provider:  Hannah Luna, Spencer, MD Primary Cardiologist:  Paula Cullens. End, MD   Chief Complaint    83 y/o ? 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  Past Medical History    Past Medical History:  Diagnosis Date  . Anemia   . Back pain   . Hypertension 12/25/2013  . Ischemic bowel disease (HCC)    a. 03/2017 abd pain-->internal herina and ischemic jejunum s/p ex lap and small bowel resection.  . Macular degeneration of left eye 01/06/2015  . Osteoporosis   . Persistent atrial fibrillation (HCC)    a. First noted 03/2017;  b. 07/2017 Echo: EF 60-65%, no rwma, mild MR, mildly to mod dil LA; c. 07/2017 48hr Holter: persistent Afib, avg HR of 66 (range 39-140 bpm);  d. CHA2DS2VASc = 4-->Xarelto initiated 07/2017.   Past Surgical History:  Procedure Laterality Date  . CLOSED REDUCTION PROXIMAL HUMERUS FRACTURE Left 2017  . LAPAROSCOPIC SMALL BOWEL RESECTION    . LAPAROTOMY N/A 03/18/2017   Procedure: EXPLORATORY LAPAROTOMY, SMALL BOWEL RESECTION;  Surgeon: Paula RoPabon, Diego F, MD;  Location: [facility] ORS;  Service: General;  Laterality: N/A;  . TOTAL ABDOMINAL HYSTERECTOMY      Allergies  Allergies  Allergen Reactions  . Latex Rash  . Tetanus Toxoid Anaphylaxis    REACTION: anaphylaxis  . Alendronate Sodium     REACTION: passed out    History of Present Illness    83 y/o ? with the above past medical history including hypertension, anemia, osteoporosis, and macular degeneration of the left eye.  In July of this year, she was admitted to [facility] with abdominal pain was found to have an internal hernia and ischemic jejunum for which exploratory laparotomy and small bowel resection were performed.  Upon presentation to the hospital at that time, she was noted to be in atrial  fibrillation though further workup was not performed at that time.  Following recovery from surgery, she continued to report some amount of fatigue and dyspnea on exertion and was referred to her primary care provider by her surgeon's office.  She was again found to be in atrial fibrillation and as result, she was set up to see Dr. Okey DupreEnd on October 16.  Patient denied palpitations at that time and recommendation was made to start oral anticoagulation.  Initially, [name] wants to learn more about anticoagulation and also determine how much it would cost her.  In the meantime, an echocardiogram was performed and showed normal LV function with mildly to moderately dilated left atrium.  48-hour Holter monitor showed persistent atrial fibrillation with fair amount of rate variability between 39 and 140 bpm.  Average heart rate was 66.  She did have pauses up to 2.5 seconds.  Recommendation was again made to initiate oral anticoagulation and [name] agreed to start Xarelto 20 mg daily.  She has been taking this for 2-1/2 weeks now and has been tolerating it well.  She remains in atrial fibrillation and reports general fatigue and some amount of dyspnea on exertion when walking.  If she is in the supermarket, she needs to lean on a cart while walking and in that setting, she does just fine.  If she has to walk without the support of a cart however, she tires easily.  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  She denies chest pain, palpitations, PND, orthopnea, dizziness, syncope, edema, or early satiety.  At this point, she is still considering cardioversion.  Home Medications    Prior to Admission medications   Medication Sig Start Date Luna Date Taking? Authorizing Provider  docusate sodium (COLACE) 100 MG capsule Take 100 mg by mouth daily.    Yes [provider]  lisinopril (PRINIVIL,ZESTRIL) 10  MG tablet TAKE 1 TABLET BY MOUTH ONCE DAILY 04/17/17  Yes Copland, Karleen HampshireSpencer, MD  Multiple Vitamins-Minerals (ICAPS) CAPS Take 1 capsule by mouth daily.   Yes [provider]  rivaroxaban (XARELTO) 20 MG TABS tablet Take 1 tablet (20 mg total) by mouth daily with supper. 07/11/17  Yes Luna, Cristal Deerhristopher, MD    Review of Systems    She continues to have general fatigue and also mild dyspnea on exertion as outlined above.  She denies chest pain, palpitations, PND, orthopnea, dizziness, syncope, edema, or early satiety.  All other systems reviewed and are otherwise negative except as noted above.  Physical Exam    VS:  BP (!) 154/80 (BP Location: Left Arm, Patient Position: Sitting, Cuff Size: Normal)   Pulse 74   Ht 5' (1.524 m)   Wt 133 lb (60.3 kg)   BMI 25.97 kg/m  , BMI Body mass index is 25.97 kg/m. GEN: Well nourished, well developed, in no acute distress.  HEENT: normal.  Neck: Supple, no JVD, carotid bruits, or masses. Cardiac: Irregularly irregular, no murmurs, rubs, or gallops. No clubbing, cyanosis, edema.  Radials/DP/PT 2+ and equal bilaterally.  Respiratory:  Respirations regular and unlabored, clear to auscultation bilaterally. GI: Soft, nontender, nondistended, BS + x 4. MS: no deformity or atrophy. Skin: warm and dry, no rash. Neuro:  Strength and sensation are intact. Psych: Normal affect.  Accessory Clinical Findings    ECG - atrial fibrillation, 74, leftward axis, no acute changes.  Assessment & Plan    1.  Persistent atrial fibrillation: Patient was initially diagnosed with atrial fibrillation in the setting of ischemic bowel disease requiring small bowel resection in July 2018.  A. fib was not worked up at that time and she was later referred back to us in the setting of persistent A. fib.  She denies palpitations but since her surgery in July, has had some degree of chronic fatigue and also dyspnea on exertion.  Recent echocardiogram showed normal LV function  with a mildly to moderately dilated left atrium.  48-hour Holter monitor showed persistent atrial fibrillation with considerable rate variability (39-140) and average heart rate in the 60s.  She has now been on Xarelto for the past 2-1/2 weeks.  We discussed the role of anticoagulation in atrial fibrillation at length today as well as the roles of rhythm versus rate control (approx 45 mins).  Because she has been experiencing this fatigue ever since her surgery in July, it's not entirely clear that A. fib is the primary culprit for her symptoms versus deconditioning after abdominal surgery.  That said, with a mildly to moderately dilated left atrium, pursuing cardioversion to see if this helps her symptoms will be more favorable now versus later on down the road.  Patient is still very much on the fence about this, as is her daughter, though her daughter seems to be more interested in pursuing cardioversion.  Patient says she will think about  this over the next 2 weeks as she has not quite hit 3 weeks of consistent oral anticoagulation yet anyway, and will contact us if she chooses to pursue cardioversion.  Regardless, I will arrange for follow-up basic metabolic panel and CBC in about another 2 weeks as it will have been 1 month since she started Xarelto.  We will also arrange for follow-up in about 4-6 weeks to rediscuss.  2.  Essential hypertension: Blood pressure is elevated in clinic today.  She is on lisinopril at home.  She says her pressures at home are typically in the 120s-130s.  I will not make any changes today.  3.  Dispo:  Luna/u cbc/bmet in 2 wks.  Pt will call if she opts to schedule DCCV.  Luna/u with Dr. Okey Dupre in 4-6 wks.  Nicolasa Ducking, NP 08/02/2017, 4:54 PM

## 2017-08-02 NOTE — Patient Instructions (Signed)
Medication Instructions:  Your physician recommends that you continue on your current medications as directed. Please refer to the Current Medication list given to you today.   Labwork: Your physician recommends that you return for lab work in: 2 WEEKS ON 08/16/17. (CBC, CMET). - [facility]. You will check in at the front desk to the right as you walk into the atrium. Valet Parking is offered if needed.    Testing/Procedures: none  Follow-Up: Your physician recommends that you schedule a follow-up appointment in: 6 WEEKS (EARLY January) WITH DR END.   If you need a refill on your cardiac medications before your next appointment, please call your pharmacy.

## 2017-08-15 ENCOUNTER — Telehealth: Payer: Self-pay | Admitting: Internal Medicine

## 2017-08-15 NOTE — Telephone Encounter (Signed)
S/w Dewayne HatchAnn, patient's daughter. She received a message in MyChart that results available for EKG. I advised that it must have been from her last office visit. Ward Givenshris Berge, NP would have reviewed EKG that day. She was appreciative. She is also aware patient needs to go to the [facility] tomorrow, 08/16/17, for lab work.

## 2017-08-15 NOTE — Telephone Encounter (Signed)
Please call daughter to go over echo results .

## 2017-08-16 ENCOUNTER — Other Ambulatory Visit
Admission: RE | Admit: 2017-08-16 | Discharge: 2017-08-16 | Disposition: A | Discharge status: Home / Self Care | Payer: PPO | Source: Ambulatory Visit | Attending: Nurse Practitioner | Admitting: Nurse Practitioner

## 2017-08-16 DIAGNOSIS — I10 Essential (primary) hypertension: Secondary | ICD-10-CM

## 2017-08-16 DIAGNOSIS — I4819 Other persistent atrial fibrillation: Secondary | ICD-10-CM

## 2017-08-16 DIAGNOSIS — I481 Persistent atrial fibrillation: Secondary | ICD-10-CM | POA: Diagnosis not present

## 2017-08-16 LAB — CBC WITH DIFFERENTIAL/PLATELET
BASOS ABS: 0 10*3/uL (ref 0–0.1)
BASOS PCT: 1 %
EOS ABS: 0.1 10*3/uL (ref 0–0.7)
Eosinophils Relative: 2 %
HEMATOCRIT: 38.4 % (ref 35.0–47.0)
Hemoglobin: 12.5 g/dL (ref 12.0–16.0)
Lymphocytes Relative: 29 %
Lymphs Abs: 2 10*3/uL (ref 1.0–3.6)
MCH: 25 pg — ABNORMAL LOW (ref 26.0–34.0)
MCHC: 32.5 g/dL (ref 32.0–36.0)
MCV: 77 fL — ABNORMAL LOW (ref 80.0–100.0)
MONO ABS: 0.6 10*3/uL (ref 0.2–0.9)
Monocytes Relative: 9 %
NEUTROS ABS: 4.1 10*3/uL (ref 1.4–6.5)
Neutrophils Relative %: 59 %
PLATELETS: 198 10*3/uL (ref 150–440)
RBC: 4.99 MIL/uL (ref 3.80–5.20)
RDW: 17.5 % — AB (ref 11.5–14.5)
WBC: 7 10*3/uL (ref 3.6–11.0)

## 2017-08-16 LAB — BASIC METABOLIC PANEL
Anion gap: 9 (ref 5–15)
BUN: 18 mg/dL (ref 6–20)
CHLORIDE: 106 mmol/L (ref 101–111)
CO2: 27 mmol/L (ref 22–32)
CREATININE: 0.94 mg/dL (ref 0.44–1.00)
Calcium: 9.3 mg/dL (ref 8.9–10.3)
GFR calc Af Amer: >60 mL/min (ref >60)
GFR calc non Af Amer: 52 mL/min — ABNORMAL LOW (ref >60)
Glucose, Bld: 118 mg/dL — ABNORMAL HIGH (ref 65–99)
Potassium: 4.6 mmol/L (ref 3.5–5.1)
SODIUM: 142 mmol/L (ref 135–145)

## 2017-09-25 ENCOUNTER — Telehealth: Payer: Self-pay | Admitting: Family Medicine

## 2017-09-25 DIAGNOSIS — H04123 Dry eye syndrome of bilateral lacrimal glands: Secondary | ICD-10-CM | POA: Diagnosis not present

## 2017-09-25 DIAGNOSIS — H353132 Nonexudative age-related macular degeneration, bilateral, intermediate dry stage: Secondary | ICD-10-CM | POA: Diagnosis not present

## 2017-09-25 DIAGNOSIS — H16213 Exposure keratoconjunctivitis, bilateral: Secondary | ICD-10-CM | POA: Diagnosis not present

## 2017-09-25 DIAGNOSIS — H35363 Drusen (degenerative) of macula, bilateral: Secondary | ICD-10-CM | POA: Diagnosis not present

## 2017-09-25 NOTE — Telephone Encounter (Signed)
[name] notified that handicap placard application is ready to be picked up at the front desk.

## 2017-09-25 NOTE — Telephone Encounter (Signed)
Form completed and placed in Dr. Copland's in box for signature. 

## 2017-09-25 NOTE — Telephone Encounter (Signed)
Done and signed 

## 2017-09-25 NOTE — Telephone Encounter (Signed)
Pt daughter POA Roger Shelternn Castagna brought DMV Disability parking placard app. Please call Dewayne Hatchnn [phone] when ready to pick up.

## 2017-10-08 ENCOUNTER — Other Ambulatory Visit: Payer: Self-pay | Admitting: Family Medicine

## 2017-10-11 ENCOUNTER — Ambulatory Visit: Payer: PPO | Admitting: Internal Medicine

## 2017-10-11 ENCOUNTER — Telehealth: Payer: Self-pay | Admitting: Family Medicine

## 2017-10-11 ENCOUNTER — Encounter: Payer: Self-pay | Admitting: Internal Medicine

## 2017-10-11 VITALS — BP 130/70 | HR 63 | Ht 59.0 in | Wt 135.2 lb

## 2017-10-11 DIAGNOSIS — R2681 Unsteadiness on feet: Secondary | ICD-10-CM

## 2017-10-11 DIAGNOSIS — I4821 Permanent atrial fibrillation: Secondary | ICD-10-CM

## 2017-10-11 DIAGNOSIS — I482 Chronic atrial fibrillation: Secondary | ICD-10-CM

## 2017-10-11 NOTE — Patient Instructions (Addendum)
Follow-Up: Follow up with your primary care provider for any disability paperwork.   Your physician wants you to follow-up in: 6 months. You will receive a reminder letter in the mail two months in advance. If you don't receive a letter, please call our office to schedule the follow-up appointment.  It was a pleasure seeing you today here in the office. Please do not hesitate to give us a call back if you have any further questions. [phone]  [place] CellarPamela A. RN, BSN

## 2017-10-11 NOTE — Progress Notes (Signed)
Follow-up Outpatient Visit Date: 10/11/2017  Primary Care Provider: Hannah Beat, MD [address] [place] [address]  Chief Complaint: Follow-up atrial fibrillation  HPI:  Paula Luna is a 84 y.o. year-old female with history of persistent atrial fibrillation, hypertension, and ischemic bowel secondary to internal hernia, who presents for follow-up of atrial fibrillation. She was last seen in our office by Ward Givens, NP, on 08/02/17. At that time, she noted some generalized fatigue and exertional dyspnea. She was tolerating rivaroxaban well. Cardioversion was discussed, but Paula Luna wanted to think about the procedure and discuss it with her family before moving forward.  Today, Paula Luna feels well with the exception of some gait instability. She has not fallen but is reliant on her walker for balance. She notes some mild dizziness in the morning, though this resolves on its own. She has not fallen. Paula Luna denies palpitations, chest pain, shortness of breath, and edema. She remains on rivaroxaban without significant bleeding. She does note easy bruising as well as multiple abrasions on her arms.  Paula Luna notes intermittent cramps in her calves at night. This has been a long-standing problem for her. She does not have any thigh or calf pain with ambulation.  --------------------------------------------------------------------------------------------------  Cardiovascular History & Procedures: Cardiovascular Problems:  Persistent atrial fibrillation  Risk Factors:  Hypertension and age greater than 6  Cath/PCI:  None  CV Surgery:  None  EP Procedures and Devices:  48-hour Holter monitor (07/11/17): Atrial fibrillation with variable heart rate response (average HR 66 bpm; range 39-140 bpm). Occasional PVCs versus aberrancy.  Non-Invasive Evaluation(s):  TTE (07/13/17): Normal LV size with mild LVH. LVEF 60-65% with normal wall motion. Mild MR.  Mild to moderate LAE. Normal RV size and function.  Recent CV Pertinent Labs: Lab Results  Component Value Date   CHOL 163 01/23/2017   HDL 62.30 01/23/2017   LDLCALC 90 01/23/2017   LDLDIRECT 101.2 05/20/2009   TRIG 75 03/21/2017   CHOLHDL 3 01/23/2017   INR 1.0 06/27/2017   K 4.6 08/16/2017   MG 2.2 03/19/2017   BUN 18 08/16/2017   BUN 13 06/27/2017   CREATININE 0.94 08/16/2017    Past medical and surgical history were reviewed and updated in EPIC.  Current Meds  Medication Sig  . docusate sodium (COLACE) 100 MG capsule Take 100 mg by mouth daily.   Marland Kitchen lisinopril (PRINIVIL,ZESTRIL) 10 MG tablet TAKE 1 TABLET BY MOUTH ONCE DAILY  . Multiple Vitamins-Minerals (ICAPS) CAPS Take 1 capsule by mouth daily.  . rivaroxaban (XARELTO) 20 MG TABS tablet Take 1 tablet (20 mg total) by mouth daily with supper.    Allergies: Latex; Tetanus toxoid; and Alendronate sodium  Social History   Socioeconomic History  . Marital status: Married    Spouse name: Not on file  . Number of children: Not on file  . Years of education: Not on file  . Highest education level: Not on file  Social Needs  . Financial resource strain: Not on file  . Food insecurity - worry: Not on file  . Food insecurity - inability: Not on file  . Transportation needs - medical: Not on file  . Transportation needs - non-medical: Not on file  Occupational History  . Occupation: Musician: [id number]    Comment: 3 days a week  Tobacco Use  . Smoking status: Former Games developer  . Smokeless tobacco: Never Used  Substance and Sexual Activity  . Alcohol use: No  .  Drug use: No  . Sexual activity: No  Other Topics Concern  . Not on file  Social History Narrative   Widowed after 64 years   Working some at [employer]   Active in [employer]   Lives with daughter    Family History  Problem Relation Age of Onset  . Diabetes Mother   . Cancer Father        jaw    Review of Systems: A 12-system review  of systems was performed and was negative except as noted in the HPI.  --------------------------------------------------------------------------------------------------  Physical Exam: BP 130/70 (BP Location: Left Arm, Patient Position: Sitting, Cuff Size: Normal)   Pulse 63   Ht 4\' 11"  (1.499 m)   Wt 135 lb 4 oz (61.3 kg)   BMI 27.32 kg/m   General:  Elderly woman, seated comfortably in the exam room. She is accompanied by her daughter. HEENT: No conjunctival pallor or scleral icterus. Moist mucous membranes.  OP clear. Neck: Supple without lymphadenopathy, thyromegaly, JVD, or HJR. Lungs: Normal work of breathing. Clear to auscultation bilaterally without wheezes or crackles. Heart: Irregularly irregular without murmurs or rubs. Abd: Bowel sounds present. Soft, NT/ND without hepatosplenomegaly Ext: No lower extremity edema. Radial, PT, and DP pulses are 2+ bilaterally. Skin: Warm and dry without rash.  EKG:  Atrial fibrillation (ventricular rate 63 bpm).  Lab Results  Component Value Date   WBC 7.0 08/16/2017   HGB 12.5 08/16/2017   HCT 38.4 08/16/2017   MCV 77.0 (L) 08/16/2017   PLT 198 08/16/2017    Lab Results  Component Value Date   NA 142 08/16/2017   K 4.6 08/16/2017   CL 106 08/16/2017   CO2 27 08/16/2017   BUN 18 08/16/2017   CREATININE 0.94 08/16/2017   GLUCOSE 118 (H) 08/16/2017   ALT 7 04/26/2017    Lab Results  Component Value Date   CHOL 163 01/23/2017   HDL 62.30 01/23/2017   LDLCALC 90 01/23/2017   LDLDIRECT 101.2 05/20/2009   TRIG 75 03/21/2017   CHOLHDL 3 01/23/2017    --------------------------------------------------------------------------------------------------  ASSESSMENT AND PLAN: Permanent atrial fibrillation Paula Luna is minimally symptomatic from a cardiac standpoint and is adequately rate controlled today without any AV-nodal blocking therapy. We have discussed cardioversion, but given her lack of symptoms and age, Paula Luna  wishes to defer this in favor of a continued rate-control strategy. She will continue with indefinite rivaroxaban 20 mg daily for stroke prevention (CHADSVASC = 4).  Gait instability Paula Luna notes continued balance problems and generalized weakness. She does not believe that she can return back to work on account of this. I do not believe that her atrial fibrillation is the driving cause behind this. I have advised Paula Luna to speak with Dr. Patsy Lageropland regarding this when she follows up with him, including possible physical therapy evaluation.  Follow-up: Return to clinic in 6 months.  Yvonne Kendallhristopher Amritpal Shropshire, MD 10/11/2017 11:15 AM

## 2017-10-11 NOTE — Telephone Encounter (Signed)
CRM for notification. See Telephone encounter for:   10/11/17.  Relation to pt: self Call back number: [phone]   Reason for call:  Patient was seen today by Yvonne Kendallhristopher End, MD cardiologist, as per specialist follow up with primary care provider for any disability paperwork. Daughter will contact [facility] and request forms to be faxed to PCP fax # [phone], daughter states time is sensitive deadline is 10/23/17 and feels patient should not return back to work due to her medical condition. Patient scheduled follow up appointment for 10/12/17.

## 2017-10-12 ENCOUNTER — Ambulatory Visit (INDEPENDENT_AMBULATORY_CARE_PROVIDER_SITE_OTHER): Payer: PPO | Admitting: Family Medicine

## 2017-10-12 ENCOUNTER — Encounter: Payer: Self-pay | Admitting: Family Medicine

## 2017-10-12 ENCOUNTER — Other Ambulatory Visit: Payer: Self-pay

## 2017-10-12 VITALS — BP 124/78 | HR 52 | Temp 97.7°F | Ht 59.0 in | Wt 134.8 lb

## 2017-10-12 DIAGNOSIS — R531 Weakness: Secondary | ICD-10-CM | POA: Diagnosis not present

## 2017-10-12 DIAGNOSIS — R269 Unspecified abnormalities of gait and mobility: Secondary | ICD-10-CM | POA: Diagnosis not present

## 2017-10-12 DIAGNOSIS — I482 Chronic atrial fibrillation, unspecified: Secondary | ICD-10-CM

## 2017-10-12 NOTE — Progress Notes (Signed)
Dr. Karleen Hampshire T. Tiffanyann Deroo, MD, CAQ Sports Medicine Primary [facility] and Sports Medicine [address] [place], [address] Phone: [phone] Fax: [phone]  10/12/2017  Patient: Paula Luna, MRN: [id number], DOB: 1925/06/25, 84 y.o.  Primary Physician:  Hannah Beat, MD   Chief Complaint  Patient presents with  . Follow-up    for disability paperwork   Subjective:   Paula Luna is a 84 y.o. very pleasant female patient who presents with the following:  Intermittent bruising for A Fib.   Xarelto -   She is here with her daughter, and she has never fully recovered after her operation from July of last year, and prior to that she was working at [employer] at age 17.  She continues to be unsteady and have some gait disturbance as well as weakness compared to her prior state of health.  She is currently in chronic A. fib, anticoagulated on Xarelto, and ultimately she did not decide that she wanted to go forward with cardioversion.  Past Medical History, Surgical History, Social History, Family History, Problem List, Medications, and Allergies have been reviewed and updated if relevant.  Patient Active Problem List   Diagnosis Date Noted  . Dyspnea on exertion 06/27/2017  . Atrial fibrillation (HCC) 04/26/2017  . Anemia 04/26/2017  . 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 Macular degeneration of left eye 01/06/2015  . Essential hypertension 12/25/2013  . BACK PAIN 08/02/2010    Past Medical History:  Diagnosis Date  . Anemia   . Back pain   . Hypertension 12/25/2013  . Ischemic bowel disease (HCC)    a. 03/2017 abd pain-->internal herina and ischemic jejunum s/p ex lap and small bowel resection.  . Macular degeneration of left eye 01/06/2015  . Osteoporosis   . Persistent atrial fibrillation (HCC)    a. First noted 03/2017;  b. 07/2017 Echo: EF 60-65%, no rwma, mild MR, mildly to mod dil LA; c. 07/2017 48hr Holter:  persistent Afib, avg HR of 66 (range 39-140 bpm);  d. CHA2DS2VASc = 4-->Xarelto initiated 07/2017.    Past Surgical History:  Procedure Laterality Date  . CLOSED REDUCTION PROXIMAL HUMERUS FRACTURE Left 2017  . LAPAROSCOPIC SMALL BOWEL RESECTION    . LAPAROTOMY N/A 03/18/2017   Procedure: EXPLORATORY LAPAROTOMY, SMALL BOWEL RESECTION;  Surgeon: Leafy Ro, MD;  Location: [facility] ORS;  Service: General;  Laterality: N/A;  . TOTAL ABDOMINAL HYSTERECTOMY      Social History   Socioeconomic History  . Marital status: Married    Spouse name: Not on file  . Number of children: Not on file  . Years of education: Not on file  . Highest education level: Not on file  Social Needs  . Financial resource strain: Not on file  . Food insecurity - worry: Not on file  . Food insecurity - inability: Not on file  . Transportation needs - medical: Not on file  . Transportation needs - non-medical: Not on file  Occupational History  . Occupation: Musician: [employer]    Comment: 3 days a week  Tobacco Use  . Smoking status: Former Games developer  . Smokeless tobacco: Never Used  Substance and Sexual Activity  . Alcohol use: No  . Drug use: No  . Sexual activity: No  Other Topics Concern  . Not on file  Social History Narrative   Widowed after 64 years   Working some at [employer]  Active in [employer]   Lives with daughter    Family History  Problem Relation Age of Onset  . Diabetes Mother   . Cancer Father        jaw    Allergies  Allergen Reactions  . Latex Rash  . Tetanus Toxoid Anaphylaxis    REACTION: anaphylaxis  . Alendronate Sodium     REACTION: passed out    Medication list reviewed and updated in full in [facility].   GEN: No acute illnesses, no fevers, chills. GI: No n/v/d, eating normally Pulm: No SOB Interactive and getting along well at home.  Otherwise, ROS is as per the HPI.  Objective:   BP 124/78   Pulse (!) 52   Temp 97.7 F (36.5 C)  (Oral)   Ht 4\' 11"  (1.499 m)   Wt 134 lb 12 oz (61.1 kg)   SpO2 100%   BMI 27.22 kg/m   GEN: WDWN, NAD, Non-toxic, A & O x 3 HEENT: Atraumatic, Normocephalic. Neck supple. No masses, No LAD. Ears and Nose: No external deformity. CV: irreg, irreg, No M/G/R. No JVD. No thrill. No extra heart sounds. PULM: CTA B, no wheezes, crackles, rhonchi. No retractions. No resp. distress. No accessory muscle use. EXTR: No c/c/e NEURO Normal gait.  PSYCH: Normally interactive. Conversant. Not depressed or anxious appearing.  Calm demeanor.   Laboratory and Imaging Data: Results for orders placed or performed during the hospital encounter of 08/16/17  Basic metabolic panel  Result Value Ref Range   Sodium 142 135 - 145 mmol/L   Potassium 4.6 3.5 - 5.1 mmol/L   Chloride 106 101 - 111 mmol/L   CO2 27 22 - 32 mmol/L   Glucose, Bld 118 (H) 65 - 99 mg/dL   BUN 18 6 - 20 mg/dL   Creatinine, Ser 4.09 0.44 - 1.00 mg/dL   Calcium 9.3 8.9 - 81.1 mg/dL   GFR calc non Af Amer 52 (L) >60 mL/min   GFR calc Af Amer >60 >60 mL/min   Anion gap 9 5 - 15  CBC with Differential/Platelet  Result Value Ref Range   WBC 7.0 3.6 - 11.0 K/uL   RBC 4.99 3.80 - 5.20 MIL/uL   Hemoglobin 12.5 12.0 - 16.0 g/dL   HCT 91.4 78.2 - 95.6 %   MCV 77.0 (L) 80.0 - 100.0 fL   MCH 25.0 (L) 26.0 - 34.0 pg   MCHC 32.5 32.0 - 36.0 g/dL   RDW 21.3 (H) 08.6 - 57.8 %   Platelets 198 150 - 440 K/uL   Neutrophils Relative % 59 %   Neutro Abs 4.1 1.4 - 6.5 K/uL   Lymphocytes Relative 29 %   Lymphs Abs 2.0 1.0 - 3.6 K/uL   Monocytes Relative 9 %   Monocytes Absolute 0.6 0.2 - 0.9 K/uL   Eosinophils Relative 2 %   Eosinophils Absolute 0.1 0 - 0.7 K/uL   Basophils Relative 1 %   Basophils Absolute 0.0 0 - 0.1 K/uL     Assessment and Plan:   Gait disturbance - Plan: Ambulatory referral to Physical Therapy  Weakness  Chronic atrial fibrillation (HCC)  >25 minutes spent in face to face time with patient, >50% spent in  counselling or coordination of care:  Long conversation with both the patient and her daughter about her overall state of health.  She is 84 years old and she had major abdominal surgery last summer, and she has never really recovered to her prior  baseline.  She may never fully recover back to her prior good state of health.  At this point it is probably a little too early to say this definitively, but at this point I do not think that she can currently return to full duty at [employer].  We are going to try some balance and gait training at the hospital physical therapy program to see if this helps her functionality.  I have given her a note excusing her from work until she is cleared by a physician to return to work.  I am happy to fill out any other additional paperwork that she needs.  Ultimately, she may end up needing to retire.  Follow-up: Return in about 4 months (around 02/09/2018).  Orders Placed This Encounter  Procedures  . Ambulatory referral to Physical Therapy    Signed,  Karleen HampshireSpencer T. Kelvyn Schunk, MD   Allergies as of 10/12/2017      Reactions   Latex Rash   Tetanus Toxoid Anaphylaxis   REACTION: anaphylaxis   Alendronate Sodium    REACTION: passed out      Medication List        Accurate as of 10/12/17 11:59 PM. Always use your most recent med list.          docusate sodium 100 MG capsule Commonly known as:  COLACE Take 100 mg by mouth daily.   ICAPS Caps Take 1 capsule by mouth daily.   lisinopril 10 MG tablet Commonly known as:  PRINIVIL,ZESTRIL TAKE 1 TABLET BY MOUTH ONCE DAILY   rivaroxaban 20 MG Tabs tablet Commonly known as:  XARELTO Take 1 tablet (20 mg total) by mouth daily with supper.

## 2017-10-16 ENCOUNTER — Telehealth: Payer: Self-pay | Admitting: Emergency Medicine

## 2017-10-16 NOTE — Telephone Encounter (Signed)
Copied from CRM [id number]. Topic: General - Other >> Oct 16, 2017  9:01 AM Gerrianne ScalePayne, Angela L wrote: Reason for CRM: patient daughter Dewayne Hatchnn [phone]  would like to speak with Dr Brayton Laymanoplands nurse/assistant to discuss paperwork for a leave of absence

## 2017-10-18 ENCOUNTER — Encounter: Payer: Self-pay | Admitting: *Deleted

## 2017-10-18 NOTE — Telephone Encounter (Signed)
Dewayne Hatchnn (patient daughter) called needing additional information added to 10/12/17 letter.  It needs a return to work date or date of her next follow up appointment which is 02/19/18 for her cpx. They have a deadline of 10/23/17 for this to be turned in.  Please fax to [phone] with claim # [id number] [phone] aa  Best number for Dewayne Hatchnn [phone] Please let Dewayne Hatchnn know when this has been done

## 2017-10-18 NOTE — Telephone Encounter (Signed)
Can you please help with reevaluation date 02/19/2018 added?

## 2017-10-18 NOTE — Telephone Encounter (Signed)
Letter re-written with next appointment date and faxed to [phone].  [name] notified by telephone that updated letter has been faxed as requested.

## 2017-11-23 ENCOUNTER — Ambulatory Visit: Payer: PPO | Attending: Family Medicine

## 2017-11-23 DIAGNOSIS — R262 Difficulty in walking, not elsewhere classified: Secondary | ICD-10-CM | POA: Diagnosis not present

## 2017-11-23 DIAGNOSIS — M6281 Muscle weakness (generalized): Secondary | ICD-10-CM | POA: Diagnosis not present

## 2017-11-23 NOTE — Therapy (Signed)
[facility] PHYSICAL AND SPORTS MEDICINE [address], [place], [address] Phone: [phone]   Fax:  [phone]  Physical Therapy Evaluation  Patient Details  Name: Paula Luna MRN: [id number] Date of Birth: 1925/04/04 Referring Provider: Karleen Hampshire copland MD   Encounter Date: 11/23/2017  PT End of Session - 11/23/17 1922    Visit Number  1    Number of Visits  17    Date for PT Re-Evaluation  01/18/18    Authorization Type  1 / 6 FOTO    PT Start Time  1715    PT Stop Time  1815    PT Time Calculation (min)  60 min    Equipment Utilized During Treatment  Gait belt    Activity Tolerance  Patient tolerated treatment well;Patient limited by fatigue    Behavior During Therapy  WFL for tasks assessed/performed       Past Medical History:  Diagnosis Date  . Anemia   . Back pain   . Hypertension 12/25/2013  . Ischemic bowel disease (HCC)    a. 03/2017 abd pain-->internal herina and ischemic jejunum s/p ex lap and small bowel resection.  . Macular degeneration of left eye 01/06/2015  . Osteoporosis   . Persistent atrial fibrillation (HCC)    a. First noted 03/2017;  b. 07/2017 Echo: EF 60-65%, no rwma, mild MR, mildly to mod dil LA; c. 07/2017 48hr Holter: persistent Afib, avg HR of 66 (range 39-140 bpm);  d. CHA2DS2VASc = 4-->Xarelto initiated 07/2017.    Past Surgical History:  Procedure Laterality Date  . CLOSED REDUCTION PROXIMAL HUMERUS FRACTURE Left 2017  . LAPAROSCOPIC SMALL BOWEL RESECTION    . LAPAROTOMY N/A 03/18/2017   Procedure: EXPLORATORY LAPAROTOMY, SMALL BOWEL RESECTION;  Surgeon: Leafy Ro, MD;  Location: [facility] ORS;  Service: General;  Laterality: N/A;  . TOTAL ABDOMINAL HYSTERECTOMY      There were no vitals filed for this visit.   Subjective Assessment - 11/23/17 1914    Subjective  Patient demosntrates increased difficulty with performing standing balance and ambulation for a long period of time secondary  to feelings of fatigue and weakness. Patient reports increased difficulty with walking for long periods of time and states she requires to stand in a forward flexed position to maintain balance. Patient reports she is afriad she is going to fall. Patient reports difficulty with transferring up and down out of a chair requiring use of UEs. Patient states she does not feel comfortable standing and performing heavy chores such as vacuuming, cleaning, and cooking and tires out easily when she attempts. Patient reports she started having all her issues after her small bowel surgery.     Pertinent History  Small bowel surgery     Limitations  Standing;Walking    Patient Stated Goals  To walk without feeling of falling     Currently in Pain?  No/denies         [facility] PT Assessment - 11/23/17 1918      Assessment   Medical Diagnosis  balance and weakness    Referring Provider  spencer copland MD    Onset Date/Surgical Date  09/13/15    Next MD Visit  unknown     Prior Therapy  home health      Precautions   Precautions  Fall      Balance Screen   Has the patient fallen in the past 6 months  No    Has the patient  had a decrease in activity level because of a fear of falling?   Yes    Is the patient reluctant to leave their home because of a fear of falling?   No      Home Nurse, mental health  Private residence    Living Arrangements  Children    Available Help at Discharge  Family    Type of Home  House    Home Layout  Two level    Alternate Level Stairs-Number of Steps  10    Alternate Level Stairs-Rails  Right    Home Equipment  Walker - 2 wheels      Prior Function   Level of Independence  Independent with household mobility with device    Vocation  Part time employment    Vocation Requirements  Standing walking    Leisure  watching sports on tv      Cognition   Overall Cognitive Status  Within Functional Limits for tasks assessed      Observation/Other Assessments    Observations  Increased forward flexed position at lumbar in standing, requires frequent sitting rest breaks throughout enitirty of session      Sensation   Light Touch  Appears Intact      Functional Tests   Functional tests  Sit to Stand      Sit to Stand   Comments  Increased weight shift onto R LE increased hip IR/ADD during motion      Posture/Postural Control   Posture Comments  Increased lumbar flexion in standing      ROM / Strength   AROM / PROM / Strength  AROM;Strength      AROM   AROM Assessment Site  Hip;Lumbar    Right/Left Hip  Right;Left    Right Hip Extension  0    Right Hip Flexion  90    Right Hip External Rotation   30    Right Hip ABduction  30    Left Hip Extension  0    Left Hip Flexion  110    Left Hip External Rotation   30    Left Hip ABduction  30    Lumbar Flexion  WNL    Lumbar Extension  70% limited      Strength   Strength Assessment Site  Hip;Knee;Ankle    Right/Left Hip  Right;Left    Right Hip Flexion  4+/5    Right Hip Extension  3+/5    Right Hip External Rotation   3+/5    Right Hip ABduction  4/5    Left Hip Flexion  4+/5    Left Hip Extension  3+/5    Left Hip External Rotation  3+/5    Left Hip ABduction  4/5    Right/Left Knee  Right;Left    Right Knee Flexion  4+/5    Right Knee Extension  4+/5    Left Knee Flexion  4+/5    Left Knee Extension  4+/5    Right/Left Ankle  Right;Left    Right Ankle Dorsiflexion  4/5    Left Ankle Dorsiflexion  5/5      Transfers   Five time sit to stand comments   30sec to perform requires light UE support from PT      Ambulation/Gait   Assistive device  None    Gait Comments  Decreased step length B, increased postural sway during motion, slow speed with amb      Standardized Balance  Assessment   Standardized Balance Assessment  Timed Up and Go Test;10 meter walk test;Five Times Sit to Stand    10 Meter Walk  .41      Timed Up and Go Test   Normal TUG (seconds)  20       TREATMENT: Therapeutic Exercise: Ambulation 136ft with focus on improving speed and balance during motion Sit to stands -- 2 x 5 with focus on speed and decreasing use of UEs during performance  Patient demonstrates increased fatigue at end of the session   Objective measurements completed on examination: See above findings.     PT Education - 11/23/17 1921    Education provided  Yes    Education Details  HEP: 2 min walking program, sit to stands    [employer]) Educated  Patient;Child(ren)    Methods  Explanation;Demonstration;Handout    Comprehension  Returned demonstration;Verbalized understanding          PT Long Term Goals - 11/23/17 1926      PT LONG TERM GOAL #1   Title  Patient will be independent with HEP focused on decreasing fall risk to continue benefits of therapy after discharge.     Baseline  Dependent with exercise performance    Time  6    Period  Weeks    Status  New    Target Date  01/04/18      PT LONG TERM GOAL #2   Title  Patient will improve TUG to under 12 sec to indicate singificant decrease in fall risk.     Baseline  20sec    Time  8    Period  Weeks    Status  New    Target Date  01/18/18      PT LONG TERM GOAL #3   Title  Patient will improve her ability to perform to 43m/s to indicate increased safety with walking at community ambulation    Baseline  .67m/s    Time  8    Period  Weeks    Status  New    Target Date  01/18/18      PT LONG TERM GOAL #4   Title  Patient will improve 5XSTS to under 16 sec to indicate significant improvement LE functional strength and decrease fall risk.    Baseline  30sec    Time  8    Period  Weeks    Status  New    Target Date  01/18/18             Plan - 11/23/17 1922    Clinical Impression Statement  Patient is a 84 yo female presenting with increased difficulty with balancing and weakness of her LE with walking, standing and performing functional activities around her home. Patient  demonstrates decreased 5xSTS, TUG, , and indicating poor balance, increased fatigue, and increased fall risk. Patient also demonstrates increased hip weakness and will benefit from further skilled therapy to return to prior level of function.     Clinical Presentation  Stable    Clinical Presentation due to:  Sx not worsening    Clinical Decision Making  Moderate    Rehab Potential  Fair    Clinical Impairments Affecting Rehab Potential  (+) family support, decreased coormorbities (-) age    PT Frequency  2x / week    PT Duration  8 weeks    PT Treatment/Interventions  Balance training;Neuromuscular re-education;Gait training;Patient/family education;Therapeutic exercise;Therapeutic activities;Functional mobility training;Stair training;Moist Heat;Aquatic Therapy;Electrical Stimulation;Cryotherapy;Manual techniques  PT Next Visit Plan  Progress strengenthening    PT Home Exercise Plan  See education    Consulted and Agree with Plan of Care  Patient;Family member/caregiver    Family Member Consulted  daughter       Patient will benefit from skilled therapeutic intervention in order to improve the following deficits and impairments:  Abnormal gait, Decreased coordination, Decreased endurance, Decreased activity tolerance, Decreased strength, Decreased range of motion, Decreased balance, Difficulty walking  Visit Diagnosis: Muscle weakness (generalized)  Difficulty in walking, not elsewhere classified     Problem List Patient Active Problem List   Diagnosis Date Noted  . Dyspnea on exertion 06/27/2017  . Atrial fibrillation (HCC) 04/26/2017  . Anemia 04/26/2017  . 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 Macular degeneration of left eye 01/06/2015  . Essential hypertension 12/25/2013  . BACK PAIN 08/02/2010    Myrene GalasWesley Azia Toutant, PT DPT 11/23/2017, 7:35 PM  [facility] PHYSICAL AND SPORTS  MEDICINE [address] [place], [place], [address] Phone: [phone]   Fax:  [phone]  Name: Paula Luna MRN: [id number] Date of Birth: [birth year]

## 2017-11-28 ENCOUNTER — Ambulatory Visit: Payer: PPO

## 2017-11-28 DIAGNOSIS — M6281 Muscle weakness (generalized): Secondary | ICD-10-CM | POA: Diagnosis not present

## 2017-11-28 DIAGNOSIS — R262 Difficulty in walking, not elsewhere classified: Secondary | ICD-10-CM

## 2017-11-28 NOTE — Therapy (Signed)
[place] [facility] [address], [place], [address] Phone: [phone]   Fax:  [phone]  Physical Therapy Treatment  Patient Details  Name: Paula Luna MRN: [id number] Date of Birth: August 13, 1925 Referring Provider: Karleen Hampshire copland MD   Encounter Date: 11/28/2017  PT End of Session - 11/28/17 1531    Visit Number  2    Number of Visits  17    Date for PT Re-Evaluation  01/18/18    Authorization Type  2/ 6 FOTO    PT Start Time  1520    PT Stop Time  1600    PT Time Calculation (min)  40 min    Equipment Utilized During Treatment  Gait belt    Activity Tolerance  Patient tolerated treatment well;Patient limited by fatigue    Behavior During Therapy  WFL for tasks assessed/performed       Past Medical History:  Diagnosis Date  . Anemia   . Back pain   . Hypertension 12/25/2013  . Ischemic bowel disease (HCC)    a. 03/2017 abd pain-->internal herina and ischemic jejunum s/p ex lap and small bowel resection.  . Macular degeneration of left eye 01/06/2015  . Osteoporosis   . Persistent atrial fibrillation (HCC)    a. First noted 03/2017;  b. 07/2017 Echo: EF 60-65%, no rwma, mild MR, mildly to mod dil LA; c. 07/2017 48hr Holter: persistent Afib, avg HR of 66 (range 39-140 bpm);  d. CHA2DS2VASc = 4-->Xarelto initiated 07/2017.    Past Surgical History:  Procedure Laterality Date  . CLOSED REDUCTION PROXIMAL HUMERUS FRACTURE Left 2017  . LAPAROSCOPIC SMALL BOWEL RESECTION    . LAPAROTOMY N/A 03/18/2017   Procedure: EXPLORATORY LAPAROTOMY, SMALL BOWEL RESECTION;  Surgeon: Leafy Ro, MD;  Location: [facility] ORS;  Service: General;  Laterality: N/A;  . TOTAL ABDOMINAL HYSTERECTOMY      There were no vitals filed for this visit.  Subjective Assessment - 11/28/17 1526    Subjective  Patient reports she felt good after the previous treatment session and states she has been performing her exercises at home when  walking her dog to the door.     Pertinent History  Small bowel surgery     Limitations  Standing;Walking    Patient Stated Goals  To walk without feeling of falling     Currently in Pain?  No/denies         TREATMENT:  Therapeutic Exercise Hip abduction with GTB in sitting - 2 x 20  Seated ball squeeze/glute squeeze - 2 x 10 with 5 sec hold Sit to stands with UE support - 2 x 10  Standing heel lifts - 2 x 10 with 2 finger support Hip abduction in standing with UE support - x10 Feet together balance in standing - x 30sec in each position  Step taps off onto 3" step - x 10 with UE support, x5 without UE support  Patient demonstrates increased fatigue at the end of the session.     PT Education - 11/28/17 1530    Education provided  Yes    Education Details  Form/technique with exercise    Person(s) Educated  Patient    Methods  Explanation;Demonstration    Comprehension  Verbalized understanding;Returned demonstration          PT Long Term Goals - 11/23/17 1926      PT LONG TERM GOAL #1   Title  Patient will be independent with  HEP focused on decreasing fall risk to continue benefits of therapy after discharge.     Baseline  Dependent with exercise performance    Time  6    Period  Weeks    Status  New    Target Date  01/04/18      PT LONG TERM GOAL #2   Title  Patient will improve TUG to under 12 sec to indicate singificant decrease in fall risk.     Baseline  20sec    Time  8    Period  Weeks    Status  New    Target Date  01/18/18      PT LONG TERM GOAL #3   Title  Patient will improve her ability to perform 10mWT to 5821m/s to indicate increased safety with walking at community ambulation    Baseline  .61421m/s    Time  8    Period  Weeks    Status  New    Target Date  01/18/18      PT LONG TERM GOAL #4   Title  Patient will improve 5XSTS to under 16 sec to indicate significant improvement LE functional strength and decrease fall risk.    Baseline  30sec     Time  8    Period  Weeks    Status  New    Target Date  01/18/18            Plan - 11/28/17 1548    Clinical Impression Statement  Patient demonstrates increased fatigue at the end of the session. Patient demosntrates decreased confidence with performing standing balance exercises, but is able to perform the motion with PT CGA after verbal encouragement. Patient tires quickly requiring frequent sitting rest breaks and patient will benefit from further skilled therapy to return to prior level of function.     Rehab Potential  Fair    Clinical Impairments Affecting Rehab Potential  (+) family support, decreased coormorbities (-) age    PT Frequency  2x / week    PT Duration  8 weeks    PT Treatment/Interventions  Balance training;Neuromuscular re-education;Gait training;Patient/family education;Therapeutic exercise;Therapeutic activities;Functional mobility training;Stair training;Moist Heat;Aquatic Therapy;Electrical Stimulation;Cryotherapy;Manual techniques    PT Next Visit Plan  Progress strengenthening    PT Home Exercise Plan  See education    Consulted and Agree with Plan of Care  Patient;Family member/caregiver    Family Member Consulted  daughter       Patient will benefit from skilled therapeutic intervention in order to improve the following deficits and impairments:  Abnormal gait, Decreased coordination, Decreased endurance, Decreased activity tolerance, Decreased strength, Decreased range of motion, Decreased balance, Difficulty walking  Visit Diagnosis: Muscle weakness (generalized)  Difficulty in walking, not elsewhere classified     Problem List Patient Active Problem List   Diagnosis Date Noted  . Dyspnea on exertion 06/27/2017  . Atrial fibrillation (HCC) 04/26/2017  . Anemia 04/26/2017  . 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 Macular degeneration of left eye 01/06/2015  . Essential hypertension 12/25/2013   . BACK PAIN 08/02/2010    Myrene GalasWesley Scheryl Sanborn, PT DPT 11/28/2017, 4:13 PM  [name] [address] [place], [place], [address] Phone: [phone]   Fax:  [phone]  Name: Lattie CornsRoxanne D Janish MRN: [id number] Date of Birth: [birth year]

## 2017-11-30 ENCOUNTER — Ambulatory Visit: Payer: PPO

## 2017-12-05 ENCOUNTER — Ambulatory Visit: Payer: PPO

## 2017-12-05 DIAGNOSIS — M6281 Muscle weakness (generalized): Secondary | ICD-10-CM

## 2017-12-05 DIAGNOSIS — R262 Difficulty in walking, not elsewhere classified: Secondary | ICD-10-CM

## 2017-12-05 NOTE — Therapy (Signed)
[place] [facility] [address] [place], [place], [address] Phone: [phone]   Fax:  [phone]  Physical Therapy Treatment  Patient Details  Name: Paula Luna MRN: [id number] Date of Birth: [birth year] Referring Provider: Karleen Hampshire copland MD   Encounter Date: 12/05/2017  PT End of Session - 12/05/17 0927    Visit Number  3    Number of Visits  17    Date for PT Re-Evaluation  01/18/18    Authorization Type  3/ 6 FOTO    PT Start Time  0900    PT Stop Time  0945    PT Time Calculation (min)  45 min    Equipment Utilized During Treatment  Gait belt    Activity Tolerance  Patient tolerated treatment well;Patient limited by fatigue    Behavior During Therapy  [facility] for tasks assessed/performed       Past Medical History:  Diagnosis Date  . Anemia   . Back pain   . Hypertension 12/25/2013  . Ischemic bowel disease (HCC)    a. 03/2017 abd pain-->internal herina and ischemic jejunum s/p ex lap and small bowel resection.  . Macular degeneration of left eye 01/06/2015  . Osteoporosis   . Persistent atrial fibrillation (HCC)    a. First noted 03/2017;  b. 07/2017 Echo: EF 60-65%, no rwma, mild MR, mildly to mod dil LA; c. 07/2017 48hr Holter: persistent Afib, avg HR of 66 (range 39-140 bpm);  d. CHA2DS2VASc = 4-->Xarelto initiated 07/2017.    Past Surgical History:  Procedure Laterality Date  . CLOSED REDUCTION PROXIMAL HUMERUS FRACTURE Left 2017  . LAPAROSCOPIC SMALL BOWEL RESECTION    . LAPAROTOMY N/A 03/18/2017   Procedure: EXPLORATORY LAPAROTOMY, SMALL BOWEL RESECTION;  Surgeon: Leafy Ro, MD;  Location: [facility] ORS;  Service: General;  Laterality: N/A;  . TOTAL ABDOMINAL HYSTERECTOMY      There were no vitals filed for this visit.  Subjective Assessment - 12/05/17 0915    Subjective  Patient reports increased balance difficulties and states she is afraid of falling. She said she gets breathless when walking long  distances.     Pertinent History  Small bowel surgery     Limitations  Standing;Walking    Patient Stated Goals  To walk without feeling of falling     Currently in Pain?  No/denies         TREATMENT:   Therapeutic Exercise Step taps off onto 6" step from airex pad  - x 10 with UE support, x5 without UE support Standing on airex pad without UE support - 4 x 30sec Ambulation around the gym-x 122ft with focus on improving step length 4 square that ambulates around circle - x10  Sit to stands without UE support - 2 x 10  Nustep level 3 at seat level 5 - x 8 min  Patient demonstrates increased fatigue at the end of the session.      PT Education - 12/05/17 0926    Education provided  Yes    Education Details  form/technique with exercise    Person(s) Educated  Patient    Methods  Explanation;Demonstration    Comprehension  Verbalized understanding;Returned demonstration          PT Long Term Goals - 11/23/17 1926      PT LONG TERM GOAL #1   Title  Patient will be independent with HEP focused on decreasing fall risk to continue benefits of therapy after discharge.  Baseline  Dependent with exercise performance    Time  6    Period  Weeks    Status  New    Target Date  01/04/18      PT LONG TERM GOAL #2   Title  Patient will improve TUG to under 12 sec to indicate singificant decrease in fall risk.     Baseline  20sec    Time  8    Period  Weeks    Status  New    Target Date  01/18/18      PT LONG TERM GOAL #3   Title  Patient will improve her ability to perform 10mWT to 1756m/s to indicate increased safety with walking at community ambulation    Baseline  .37456m/s    Time  8    Period  Weeks    Status  New    Target Date  01/18/18      PT LONG TERM GOAL #4   Title  Patient will improve 5XSTS to under 16 sec to indicate significant improvement LE functional strength and decrease fall risk.    Baseline  30sec    Time  8    Period  Weeks    Status  New     Target Date  01/18/18            Plan - 12/05/17 0930    Clinical Impression Statement  Performed greater amount of exercises in standing to improve ability to balance with less UE support and improvement in walking for long periods of time. Patient will benefit from further skilled therapy to return to prior level of function.     Rehab Potential  Fair    Clinical Impairments Affecting Rehab Potential  (+) family support, decreased coormorbities (-) age    PT Frequency  2x / week    PT Duration  8 weeks    PT Treatment/Interventions  Balance training;Neuromuscular re-education;Gait training;Patient/family education;Therapeutic exercise;Therapeutic activities;Functional mobility training;Stair training;Moist Heat;Aquatic Therapy;Electrical Stimulation;Cryotherapy;Manual techniques    PT Next Visit Plan  Progress strengenthening    PT Home Exercise Plan  See education    Consulted and Agree with Plan of Care  Patient;Family member/caregiver    Family Member Consulted  daughter       Patient will benefit from skilled therapeutic intervention in order to improve the following deficits and impairments:  Abnormal gait, Decreased coordination, Decreased endurance, Decreased activity tolerance, Decreased strength, Decreased range of motion, Decreased balance, Difficulty walking  Visit Diagnosis: Difficulty in walking, not elsewhere classified  Muscle weakness (generalized)     Problem List Patient Active Problem List   Diagnosis Date Noted  . Dyspnea on exertion 06/27/2017  . Atrial fibrillation (HCC) 04/26/2017  . Anemia 04/26/2017  . 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 Macular degeneration of left eye 01/06/2015  . Essential hypertension 12/25/2013  . BACK PAIN 08/02/2010    Myrene GalasWesley Janus Vlcek, PT DPT 12/05/2017, 9:51 AM  [place] [facility] [address] [place],  [place], [address] Phone: [phone]   Fax:  [phone]  Name: Paula Luna MRN: [id number] Date of Birth: 1924-10-12

## 2017-12-11 ENCOUNTER — Ambulatory Visit: Payer: PPO | Attending: Family Medicine

## 2017-12-11 DIAGNOSIS — M6281 Muscle weakness (generalized): Secondary | ICD-10-CM | POA: Insufficient documentation

## 2017-12-11 DIAGNOSIS — R262 Difficulty in walking, not elsewhere classified: Secondary | ICD-10-CM | POA: Diagnosis not present

## 2017-12-11 NOTE — Therapy (Signed)
[place] [facility] [address] [place], [address] Phone: [phone]   Fax:  [phone]  Physical Therapy Treatment  Patient Details  Name: Paula Luna MRN: [id number] Date of Birth: June 13, 1925 Referring Provider: Karleen Hampshire copland MD   Encounter Date: 12/11/2017  PT End of Session - 12/11/17 1438    Visit Number  4    Number of Visits  17    Date for PT Re-Evaluation  01/18/18    Authorization Type  4/ 6 FOTO    PT Start Time  1430    PT Stop Time  1515    PT Time Calculation (min)  45 min    Equipment Utilized During Treatment  Gait belt    Activity Tolerance  Patient tolerated treatment well;Patient limited by fatigue    Behavior During Therapy  [facility] for tasks assessed/performed       Past Medical History:  Diagnosis Date  . Anemia   . Back pain   . Hypertension 12/25/2013  . Ischemic bowel disease (HCC)    a. 03/2017 abd pain-->internal herina and ischemic jejunum s/p ex lap and small bowel resection.  . Macular degeneration of left eye 01/06/2015  . Osteoporosis   . Persistent atrial fibrillation (HCC)    a. First noted 03/2017;  b. 07/2017 Echo: EF 60-65%, no rwma, mild MR, mildly to mod dil LA; c. 07/2017 48hr Holter: persistent Afib, avg HR of 66 (range 39-140 bpm);  d. CHA2DS2VASc = 4-->Xarelto initiated 07/2017.    Past Surgical History:  Procedure Laterality Date  . CLOSED REDUCTION PROXIMAL HUMERUS FRACTURE Left 2017  . LAPAROSCOPIC SMALL BOWEL RESECTION    . LAPAROTOMY N/A 03/18/2017   Procedure: EXPLORATORY LAPAROTOMY, SMALL BOWEL RESECTION;  Surgeon: Leafy Ro, MD;  Location: [facility] OR[facility];  Service: General;  Laterality: N/A;  . TOTAL ABDOMINAL HYSTERECTOMY      There were no vitals filed for this visit.  Subjective Assessment - 12/11/17 1436    Subjective  Patient reports she feels like she is moving better and feels PT is helping with her balance. Patient states she is standing up  easier.     Pertinent History  Small bowel surgery     Limitations  Standing;Walking    Patient Stated Goals  To walk without feeling of falling     Currently in Pain?  No/denies         TREATMENT:   Therapeutic Exercise Nustep level 5 at seat level 5 - x 8 min Squats with UE support on airex beam - x 15 Side stepping along airex beam - x 7 Step ups onto  4" step - x 8 performed B with least UE support available Ambulation around cones (7 cones) - x2 Ambulation in figure 8 pattern to address difficulty with turning in standing - x 10   Patient demonstrates increased fatigue at the end of the session.      PT Education - 12/11/17 1438    Education provided  Yes    Education Details  form/technique with exercise    Person(s) Educated  Patient    Methods  Explanation;Demonstration    Comprehension  Verbalized understanding;Returned demonstration          PT Long Term Goals - 11/23/17 1926      PT LONG TERM GOAL #1   Title  Patient will be independent with HEP focused on decreasing fall risk to continue benefits of therapy after discharge.  Baseline  Dependent with exercise performance    Time  6    Period  Weeks    Status  New    Target Date  01/04/18      PT LONG TERM GOAL #2   Title  Patient will improve TUG to under 12 sec to indicate singificant decrease in fall risk.     Baseline  20sec    Time  8    Period  Weeks    Status  New    Target Date  01/18/18      PT LONG TERM GOAL #3   Title  Patient will improve her ability to perform 10mWT to 789m/s to indicate increased safety with walking at community ambulation    Baseline  .6249m/s    Time  8    Period  Weeks    Status  New    Target Date  01/18/18      PT LONG TERM GOAL #4   Title  Patient will improve 5XSTS to under 16 sec to indicate significant improvement LE functional strength and decrease fall risk.    Baseline  30sec    Time  8    Period  Weeks    Status  New    Target Date  01/18/18             Plan - 12/11/17 1445    Clinical Impression Statement  Patient demonstrates improvement with ability to perform greater amount of repetitions before onset of fatigue. Patient continues to deomsntrate difficulty with ambulation with shortened stride mostly on the R side. Patient demonstrates improvement with sit to stand transfers and will benefit from further skilled therapy to return to prior level of function.     Rehab Potential  Fair    Clinical Impairments Affecting Rehab Potential  (+) family support, decreased coormorbities (-) age    PT Frequency  2x / week    PT Duration  8 weeks    PT Treatment/Interventions  Balance training;Neuromuscular re-education;Gait training;Patient/family education;Therapeutic exercise;Therapeutic activities;Functional mobility training;Stair training;Moist Heat;Aquatic Therapy;Electrical Stimulation;Cryotherapy;Manual techniques    PT Next Visit Plan  Progress strengenthening    PT Home Exercise Plan  See education    Consulted and Agree with Plan of Care  Patient;Family member/caregiver    Family Member Consulted  daughter       Patient will benefit from skilled therapeutic intervention in order to improve the following deficits and impairments:  Abnormal gait, Decreased coordination, Decreased endurance, Decreased activity tolerance, Decreased strength, Decreased range of motion, Decreased balance, Difficulty walking  Visit Diagnosis: Muscle weakness (generalized)  Difficulty in walking, not elsewhere classified     Problem List Patient Active Problem List   Diagnosis Date Noted  . Dyspnea on exertion 06/27/2017  . Atrial fibrillation (HCC) 04/26/2017  . Anemia 04/26/2017  . 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 Macular degeneration of left eye 01/06/2015  . Essential hypertension 12/25/2013  . BACK PAIN 08/02/2010    Myrene GalasWesley Marae Cottrell, PT DPT 12/11/2017, 3:18 PM  [facility] [address] [place], [place], [address] Phone: [phone]   Fax:  [phone]  Name: Paula Luna MRN: [id number] Date of Birth: 08-28-1925

## 2017-12-13 ENCOUNTER — Ambulatory Visit: Payer: PPO

## 2017-12-13 DIAGNOSIS — M6281 Muscle weakness (generalized): Secondary | ICD-10-CM | POA: Diagnosis not present

## 2017-12-13 DIAGNOSIS — R262 Difficulty in walking, not elsewhere classified: Secondary | ICD-10-CM

## 2017-12-13 NOTE — Therapy (Signed)
[facility] PHYSICAL AND SPOR[facility] MEDICINE [address] [place], [place], [address] Phone: [phone]   Fax:  [phone]  Physical Therapy Treatment  Patient Details  Name: Paula CornsRoxanne Paula Luna Luna MRN: [id number] Date of Birth: Mar 08, 1925 Referring Provider: Karleen Hampshirespencer copland MD   Encounter Date: 12/13/2017  PT End of Session - 12/13/17 1439    Visit Number  5    Number of Visits  17    Date for PT Re-Evaluation  01/18/18    Authorization Type  5/ 6 FOTO    PT Start Time  1433    PT Stop Time  1515    PT Time Calculation (min)  42 min    Equipment Utilized During Treatment  Gait belt    Activity Tolerance  Patient tolerated treatment well;Patient limited by fatigue    Behavior During Therapy  WFL for tasks assessed/performed       Past Medical History:  Diagnosis Date  . Anemia   . Back pain   . Hypertension 12/25/2013  . Ischemic bowel disease (HCC)    a. 03/2017 abd pain-->internal herina and ischemic jejunum s/p ex lap and small bowel resection.  . Macular degeneration of left eye 01/06/2015  . Osteoporosis   . Persistent atrial fibrillation (HCC)    a. First noted 03/2017;  b. 07/2017 Echo: EF 60-65%, no rwma, mild MR, mildly to mod dil LA; c. 07/2017 48hr Holter: persistent Afib, avg HR of 66 (range 39-140 bpm);  Paula Luna. CHA2DS2VASc = 4-->Xarelto initiated 07/2017.    Past Surgical History:  Procedure Laterality Date  . CLOSED REDUCTION PROXIMAL HUMERUS FRACTURE Left 2017  . LAPAROSCOPIC SMALL BOWEL RESECTION    . LAPAROTOMY N/A 03/18/2017   Procedure: EXPLORATORY LAPAROTOMY, SMALL BOWEL RESECTION;  Surgeon: Leafy RoPabon, Diego F, MD;  Location: [facility] ORS;  Service: General;  Laterality: N/A;  . TOTAL ABDOMINAL HYSTERECTOMY      There were no vitals filed for this visit.  Subjective Assessment - 12/13/17 1436    Subjective  Patient reports increased fatigue after shopping all morning. Patient reports she has been feeling stronger.     Pertinent History   Small bowel surgery     Limitations  Standing;Walking    Patient Stated Goals  To walk without feeling of falling     Currently in Pain?  No/denies           TREATMENT:   Therapeutic Exercise Nustep level 5 at seat level 5 - x 8 min Hip abduction with UE support - x15 Ambulation around the gym - 2 x 11700ft Step ups onto  4" step - x 8 performed B with least UE support available Hip extension in standing - x15  Walking backwards - 2 x 238ft Heel raises in standing - x 10  Single leg stance - x 30sec with unilateral UE support   Patient demonstrates increased fatigue at the end of the session.         PT Education - 12/13/17 1439    Education provided  Yes    Education Details  form/technique with exercise    Person(s) Educated  Patient    Methods  Explanation;Demonstration    Comprehension  Verbalized understanding;Returned demonstration          PT Long Term Goals - 11/23/17 1926      PT LONG TERM GOAL #1   Title  Patient will be independent with HEP focused on decreasing fall risk to continue benefits of therapy after discharge.  Baseline  Dependent with exercise performance    Time  6    Period  Weeks    Status  New    Target Date  01/04/18      PT LONG TERM GOAL #2   Title  Patient will improve TUG to under 12 sec to indicate singificant decrease in fall risk.     Baseline  20sec    Time  8    Period  Weeks    Status  New    Target Date  01/18/18      PT LONG TERM GOAL #3   Title  Patient will improve her ability to perform to 72m/s to indicate increased safety with walking at community ambulation    Baseline  .23m/s    Time  8    Period  Weeks    Status  New    Target Date  01/18/18      PT LONG TERM GOAL #4   Title  Patient will improve 5XSTS to under 16 sec to indicate significant improvement LE functional strength and decrease fall risk.    Baseline  30sec    Time  8    Period  Weeks    Status  New    Target Date  01/18/18             Plan - 12/13/17 1504    Clinical Impression Statement  Patient demonstrates improvement with ambulation ability with ability to perform greater amount of walking without increase in fatigue. Patient demonstrates increased postural sway with static balance exercises indicating increased fall risk and patient will benefit from further skilled therapy to return to prior level of function.     Rehab Potential  Fair    Clinical Impairments Affecting Rehab Potential  (+) family support, decreased coormorbities (-) age    PT Frequency  2x / week    PT Duration  8 weeks    PT Treatment/Interventions  Balance training;Neuromuscular re-education;Gait training;Patient/family education;Therapeutic exercise;Therapeutic activities;Functional mobility training;Stair training;Moist Heat;Aquatic Therapy;Electrical Stimulation;Cryotherapy;Manual techniques    PT Next Visit Plan  Progress strengenthening    PT Home Exercise Plan  See education    Consulted and Agree with Plan of Care  Patient;Family member/caregiver    Family Member Consulted  daughter       Patient will benefit from skilled therapeutic intervention in order to improve the following deficits and impairments:  Abnormal gait, Decreased coordination, Decreased endurance, Decreased activity tolerance, Decreased strength, Decreased range of motion, Decreased balance, Difficulty walking  Visit Diagnosis: Muscle weakness (generalized)  Difficulty in walking, not elsewhere classified     Problem List Patient Active Problem List   Diagnosis Date Noted  . Dyspnea on exertion 06/27/2017  . Atrial fibrillation (HCC) 04/26/2017  . Anemia 04/26/2017  . 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 Macular degeneration of left eye 01/06/2015  . Essential hypertension 12/25/2013  . BACK PAIN 08/02/2010    Paula Luna, PT DPT 12/13/2017, 3:11 PM  [facility] PHYSICAL AND SPORTS MEDICINE [address], [place], [address] Phone: [phone]   Fax:  [phone]  Name: DANIELLY ACKERLEY MRN: [id number] Date of Birth: October 13, 1924

## 2017-12-18 ENCOUNTER — Ambulatory Visit: Payer: PPO

## 2017-12-18 DIAGNOSIS — M6281 Muscle weakness (generalized): Secondary | ICD-10-CM | POA: Diagnosis not present

## 2017-12-18 DIAGNOSIS — R262 Difficulty in walking, not elsewhere classified: Secondary | ICD-10-CM

## 2017-12-18 NOTE — Therapy (Signed)
[facility] PHYS[facility]TS MEDICINE [name] [place], [address] Phone: [phone]   Fax:  [phone]  Physical Therapy Treatment  Patient Details  Name: Paula Luna MRN: [id number] Date of Birth: [birth year] Referring Provider: Karleen Hampshire copland MD   Encounter Date: 12/18/2017  PT End of Session - 12/18/17 1555    Visit Number  6    Number of Visits  17    Date for PT Re-Evaluation  01/18/18    Authorization Type  6/ 6 FOTO    PT Start Time  1420    PT Stop Time  1500    PT Time Calculation (min)  40 min    Equipment Utilized During Treatment  Gait belt    Activity Tolerance  Patient tolerated treatment well;Patient limited by fatigue    Behavior During Therapy  WFL for tasks assessed/performed       Past Medical History:  Diagnosis Date  . Anemia   . Back pain   . Hypertension 12/25/2013  . Ischemic bowel disease (HCC)    a. 03/2017 abd pain-->internal herina and ischemic jejunum s/p ex lap and small bowel resection.  . Macular degeneration of left eye 01/06/2015  . Osteoporosis   . Persistent atrial fibrillation (HCC)    a. First noted 03/2017;  b. 07/2017 Echo: EF 60-65%, no rwma, mild MR, mildly to mod dil LA; c. 07/2017 48hr Holter: persistent Afib, avg HR of 66 (range 39-140 bpm);  d. CHA2DS2VASc = 4-->Xarelto initiated 07/2017.    Past Surgical History:  Procedure Laterality Date  . CLOSED REDUCTION PROXIMAL HUMERUS FRACTURE Left 2017  . LAPAROSCOPIC SMALL BOWEL RESECTION    . LAPAROTOMY N/A 03/18/2017   Procedure: EXPLORATORY LAPAROTOMY, SMALL BOWEL RESECTION;  Surgeon: Leafy Ro, MD;  Location: [facility] ORS;  Service: General;  Laterality: N/A;  . TOTAL ABDOMINAL HYSTERECTOMY      There were no vitals filed for this visit.  Subjective Assessment - 12/18/17 1552    Subjective  Patient reports she is improving in her energy and states her strength is improving overall since the start of the therapy.     Pertinent History  Small bowel surgery     Limitations  Standing;Walking    Patient Stated Goals  To walk without feeling of falling     Currently in Pain?  No/denies       TREATMENT:   Therapeutic Exercise Nustep level 5 at seat level 5 - x 7 min Hip abduction with UE support - x15 Walking backwards - 2 x 72ft Step ups onto  4" step - x 10 performed B with least UE support available Ambulation around the gym - 2 x 135ft   Patient demonstrates increased fatigue at the end of the session.    PT Education - 12/18/17 1554    Education provided  Yes    Education Details  form/technique with exercise    Person(s) Educated  Patient    Methods  Explanation;Demonstration    Comprehension  Verbalized understanding;Returned demonstration          PT Long Term Goals - 11/23/17 1926      PT LONG TERM GOAL #1   Title  Patient will be independent with HEP focused on decreasing fall risk to continue benefits of therapy after discharge.     Baseline  Dependent with exercise performance    Time  6    Period  Weeks    Status  New  Target Date  01/04/18      PT LONG TERM GOAL #2   Title  Patient will improve TUG to under 12 sec to indicate singificant decrease in fall risk.     Baseline  20sec    Time  8    Period  Weeks    Status  New    Target Date  01/18/18      PT LONG TERM GOAL #3   Title  Patient will improve her ability to perform 10mWT to 6758m/s to indicate increased safety with walking at community ambulation    Baseline  .11458m/s    Time  8    Period  Weeks    Status  New    Target Date  01/18/18      PT LONG TERM GOAL #4   Title  Patient will improve 5XSTS to under 16 sec to indicate significant improvement LE functional strength and decrease fall risk.    Baseline  30sec    Time  8    Period  Weeks    Status  New    Target Date  01/18/18            Plan - 12/18/17 1555    Clinical Impression Statement  Patient demonstrates ability to ambulate greater  distances requiring less sitting rest breaks compared to previous sessions. Patient continues to be fearful performing standing exercise without UE support indicating decreased balance and poor confidence. Patient will benefit from further skilled therapy to return to prior level of function.      Rehab Potential  Fair    Clinical Impairments Affecting Rehab Potential  (+) family support, decreased coormorbities (-) age    PT Frequency  2x / week    PT Duration  8 weeks    PT Treatment/Interventions  Balance training;Neuromuscular re-education;Gait training;Patient/family education;Therapeutic exercise;Therapeutic activities;Functional mobility training;Stair training;Moist Heat;Aquatic Therapy;Electrical Stimulation;Cryotherapy;Manual techniques    PT Next Visit Plan  Progress strengenthening    PT Home Exercise Plan  See education    Consulted and Agree with Plan of Care  Patient;Family member/caregiver    Family Member Consulted  daughter       Patient will benefit from skilled therapeutic intervention in order to improve the following deficits and impairments:  Abnormal gait, Decreased coordination, Decreased endurance, Decreased activity tolerance, Decreased strength, Decreased range of motion, Decreased balance, Difficulty walking  Visit Diagnosis: Difficulty in walking, not elsewhere classified  Muscle weakness (generalized)     Problem List Patient Active Problem List   Diagnosis Date Noted  . Dyspnea on exertion 06/27/2017  . Atrial fibrillation (HCC) 04/26/2017  . Anemia 04/26/2017  . 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 Macular degeneration of left eye 01/06/2015  . Essential hypertension 12/25/2013  . BACK PAIN 08/02/2010    Myrene GalasWesley Nely Dedmon, PT DPT 12/18/2017, 4:25 PM  [facility]TS MEDICINE [address] [place], [place], [address] Phone: [phone]   Fax:   [phone]  Name: Lattie CornsRoxanne D Maysonet MRN: [id number] Date of Birth: [birth year]

## 2017-12-20 ENCOUNTER — Ambulatory Visit: Payer: PPO

## 2017-12-20 DIAGNOSIS — M6281 Muscle weakness (generalized): Secondary | ICD-10-CM

## 2017-12-20 DIAGNOSIS — R262 Difficulty in walking, not elsewhere classified: Secondary | ICD-10-CM

## 2017-12-20 NOTE — Therapy (Signed)
[facility] PHYSICAL AND SPORTS MEDICINE [address], [place], [address] Phone: [phone]   Fax:  [phone]  Physical Therapy Treatment  Patient Details  Name: Paula Luna MRN: [id number] Date of Birth: October 05, 1924 Referring Provider: Karleen Hampshire copland MD   Encounter Date: 12/20/2017  PT End of Session - 12/20/17 1448    Visit Number  7    Number of Visits  17    Date for PT Re-Evaluation  01/18/18    Authorization Type  1/ 6 FOTO    PT Start Time  1430    PT Stop Time  1515    PT Time Calculation (min)  45 min    Equipment Utilized During Treatment  Gait belt    Activity Tolerance  Patient tolerated treatment well;Patient limited by fatigue    Behavior During Therapy  [facility] for tasks assessed/performed       Past Medical History:  Diagnosis Date  . Anemia   . Back pain   . Hypertension 12/25/2013  . Ischemic bowel disease (HCC)    a. 03/2017 abd pain-->internal herina and ischemic jejunum s/p ex lap and small bowel resection.  . Macular degeneration of left eye 01/06/2015  . Osteoporosis   . Persistent atrial fibrillation (HCC)    a. First noted 03/2017;  b. 07/2017 Echo: EF 60-65%, no rwma, mild MR, mildly to mod dil LA; c. 07/2017 48hr Holter: persistent Afib, avg HR of 66 (range 39-140 bpm);  d. CHA2DS2VASc = 4-->Xarelto initiated 07/2017.    Past Surgical History:  Procedure Laterality Date  . CLOSED REDUCTION PROXIMAL HUMERUS FRACTURE Left 2017  . LAPAROSCOPIC SMALL BOWEL RESECTION    . LAPAROTOMY N/A 03/18/2017   Procedure: EXPLORATORY LAPAROTOMY, SMALL BOWEL RESECTION;  Surgeon: Leafy Ro, MD;  Location: [facility] ORS;  Service: General;  Laterality: N/A;  . TOTAL ABDOMINAL HYSTERECTOMY      There were no vitals filed for this visit.  Subjective Assessment - 12/20/17 1438    Subjective  Patient reports she continues to improve. Patient reports her LE feels fatigued after the therapy sessions    Pertinent History   Small bowel surgery     Limitations  Standing;Walking    Patient Stated Goals  To walk without feeling of falling     Currently in Pain?  No/denies        TREATMENT:   Therapeutic Exercise Nustep level 5 at seat level 6 - x 8 min Ambulation around the gym -  135ft, x17ft Side stepping across airex beam - x8; x 2 with minimal use of UE with exercise Standing on wobbleboard forward/backward - 2 x 45sec    Patient demonstrates increased fatigue at the end of the session.   PT Education - 12/20/17 1447    Education provided  Yes    Education Details  form/technique with exercise    Person(s) Educated  Patient    Methods  Explanation;Demonstration    Comprehension  Verbalized understanding;Returned demonstration          PT Long Term Goals - 11/23/17 1926      PT LONG TERM GOAL #1   Title  Patient will be independent with HEP focused on decreasing fall risk to continue benefits of therapy after discharge.     Baseline  Dependent with exercise performance    Time  6    Period  Weeks    Status  New    Target Date  01/04/18  PT LONG TERM GOAL #2   Title  Patient will improve TUG to under 12 sec to indicate singificant decrease in fall risk.     Baseline  20sec    Time  8    Period  Weeks    Status  New    Target Date  01/18/18      PT LONG TERM GOAL #3   Title  Patient will improve her ability to perform 10mWT to 171m/s to indicate increased safety with walking at community ambulation    Baseline  .1852m/s    Time  8    Period  Weeks    Status  New    Target Date  01/18/18      PT LONG TERM GOAL #4   Title  Patient will improve 5XSTS to under 16 sec to indicate significant improvement LE functional strength and decrease fall risk.    Baseline  30sec    Time  8    Period  Weeks    Status  New    Target Date  01/18/18            Plan - 12/20/17 1449    Clinical Impression Statement  Patient demonstrates improved ability to perform longer bouts of  ambulation before fatigue indicating funcitonal carryover between treatment sessions. Although patient is improving, she continues to demonstrate poor dynamic and static balance with decrease confidence with standing activites. Patient will benefit from further skilled therapy to return to prior level fo function.    Rehab Potential  Fair    Clinical Impairments Affecting Rehab Potential  (+) family support, decreased coormorbities (-) age    PT Frequency  2x / week    PT Duration  8 weeks    PT Treatment/Interventions  Balance training;Neuromuscular re-education;Gait training;Patient/family education;Therapeutic exercise;Therapeutic activities;Functional mobility training;Stair training;Moist Heat;Aquatic Therapy;Electrical Stimulation;Cryotherapy;Manual techniques    PT Next Visit Plan  Progress strengenthening    PT Home Exercise Plan  See education    Consulted and Agree with Plan of Care  Patient;Family member/caregiver    Family Member Consulted  daughter       Patient will benefit from skilled therapeutic intervention in order to improve the following deficits and impairments:  Abnormal gait, Decreased coordination, Decreased endurance, Decreased activity tolerance, Decreased strength, Decreased range of motion, Decreased balance, Difficulty walking  Visit Diagnosis: Difficulty in walking, not elsewhere classified  Muscle weakness (generalized)     Problem List Patient Active Problem List   Diagnosis Date Noted  . Dyspnea on exertion 06/27/2017  . Atrial fibrillation (HCC) 04/26/2017  . Anemia 04/26/2017  . 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 Macular degeneration of left eye 01/06/2015  . Essential hypertension 12/25/2013  . BACK PAIN 08/02/2010    Myrene GalasWesley Loisann Roach, PT DPT 12/20/2017, 3:16 PM  [facility] MEDICINE [address] [place], [place], [address] Phone:  [phone]   Fax:  [phone]  Name: Lattie CornsRoxanne D Lorio MRN: [id number] Date of Birth: 1925/07/21

## 2017-12-25 ENCOUNTER — Ambulatory Visit: Payer: PPO

## 2017-12-25 DIAGNOSIS — M6281 Muscle weakness (generalized): Secondary | ICD-10-CM

## 2017-12-25 DIAGNOSIS — R262 Difficulty in walking, not elsewhere classified: Secondary | ICD-10-CM

## 2017-12-25 NOTE — Therapy (Signed)
[facility] [address] [place], [place], [address] Phone: [phone]   Fax:  [phone]  Physical Therapy Treatment  Patient Details  Name: Paula Luna MRN: [id number] Date of Birth: April 04, 1925 Referring Provider: Karleen Hampshirespencer copland MD   Encounter Date: 12/25/2017  PT End of Session - 12/25/17 1101    Visit Number  8    Number of Visits  17    Date for PT Re-Evaluation  01/18/18    Authorization Type  2/ 6 FOTO    Authorization Time Period  8 / 10 Progress note    PT Start Time  1030    PT Stop Time  1115    PT Time Calculation (min)  45 min    Equipment Utilized During Treatment  Gait belt    Activity Tolerance  Patient tolerated treatment well;Patient limited by fatigue    Behavior During Therapy  WFL for tasks assessed/performed       Past Medical History:  Diagnosis Date  . Anemia   . Back pain   . Hypertension 12/25/2013  . Ischemic bowel disease (HCC)    a. 03/2017 abd pain-->internal herina and ischemic jejunum s/p ex lap and small bowel resection.  . Macular degeneration of left eye 01/06/2015  . Osteoporosis   . Persistent atrial fibrillation (HCC)    a. First noted 03/2017;  b. 07/2017 Echo: EF 60-65%, no rwma, mild MR, mildly to mod dil LA; c. 07/2017 48hr Holter: persistent Afib, avg HR of 66 (range 39-140 bpm);  d. CHA2DS2VASc = 4-->Xarelto initiated 07/2017.    Past Surgical History:  Procedure Laterality Date  . CLOSED REDUCTION PROXIMAL HUMERUS FRACTURE Left 2017  . LAPAROSCOPIC SMALL BOWEL RESECTION    . LAPAROTOMY N/A 03/18/2017   Procedure: EXPLORATORY LAPAROTOMY, SMALL BOWEL RESECTION;  Surgeon: Leafy RoPabon, Diego F, MD;  Location: [facility] OR[facility];  Service: General;  Laterality: N/A;  . TOTAL ABDOMINAL HYSTERECTOMY      There were no vitals filed for this visit.  Subjective Assessment - 12/25/17 1033    Subjective  Patient reports no major changes since the previous session. Patient states  increased fatigue after the previous session.     Pertinent History  Small bowel surgery     Limitations  Standing;Walking    Patient Stated Goals  To walk without feeling of falling     Currently in Pain?  No/denies       TREATMENT:   Therapeutic Exercise Nustep level 6 at seat level 5 - x 5 min Ambulation around the gym -  27000ft x 15600ft Side stepping up and over 4" step - x20 with unilateral support  Sit to stands from chair - 2 x 10  4 square with YTB around knees - x10 B      Patient demonstrates increased fatigue at the end of the session.     PT Education - 12/25/17 1101    Education provided  Yes    Education Details  form/technique with exercise    Person(s) Educated  Patient    Methods  Explanation;Demonstration    Comprehension  Verbalized understanding;Returned demonstration          PT Long Term Goals - 11/23/17 1926      PT LONG TERM GOAL #1   Title  Patient will be independent with HEP focused on decreasing fall risk to continue benefits of therapy after discharge.     Baseline  Dependent with exercise performance  Time  6    Period  Weeks    Status  New    Target Date  01/04/18      PT LONG TERM GOAL #2   Title  Patient will improve TUG to under 12 sec to indicate singificant decrease in fall risk.     Baseline  20sec    Time  8    Period  Weeks    Status  New    Target Date  01/18/18      PT LONG TERM GOAL #3   Title  Patient will improve her ability to perform to 82m/s to indicate increased safety with walking at community ambulation    Baseline  .62m/s    Time  8    Period  Weeks    Status  New    Target Date  01/18/18      PT LONG TERM GOAL #4   Title  Patient will improve 5XSTS to under 16 sec to indicate significant improvement LE functional strength and decrease fall risk.    Baseline  30sec    Time  8    Period  Weeks    Status  New    Target Date  01/18/18            Plan - 12/25/17 1255    Clinical Impression  Statement  Patient demonstrates improvement with ability to ambulate 2103ft and tolerate 1 extra exercise before asking to stop therapy. Patient continues to demonstrate decreased confidence with activities and will benefit from further skilled therapy to return to prior level of function.     Rehab Potential  Fair    Clinical Impairments Affecting Rehab Potential  (+) family support, decreased coormorbities (-) age    PT Frequency  2x / week    PT Duration  8 weeks    PT Treatment/Interventions  Balance training;Neuromuscular re-education;Gait training;Patient/family education;Therapeutic exercise;Therapeutic activities;Functional mobility training;Stair training;Moist Heat;Aquatic Therapy;Electrical Stimulation;Cryotherapy;Manual techniques    PT Next Visit Plan  Progress strengenthening    PT Home Exercise Plan  See education    Consulted and Agree with Plan of Care  Patient;Family member/caregiver    Family Member Consulted  daughter       Patient will benefit from skilled therapeutic intervention in order to improve the following deficits and impairments:  Abnormal gait, Decreased coordination, Decreased endurance, Decreased activity tolerance, Decreased strength, Decreased range of motion, Decreased balance, Difficulty walking  Visit Diagnosis: Muscle weakness (generalized)  Difficulty in walking, not elsewhere classified     Problem List Patient Active Problem List   Diagnosis Date Noted  . Dyspnea on exertion 06/27/2017  . Atrial fibrillation (HCC) 04/26/2017  . Anemia 04/26/2017  . 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 Macular degeneration of left eye 01/06/2015  . Essential hypertension 12/25/2013  . BACK PAIN 08/02/2010    Myrene Galas, PT DPT 12/25/2017, 12:57 PM  [facility] PHYSICAL AND SPORTS MEDICINE [address] [place], [address] Phone: [phone]   Fax:   [phone]  Name: Paula Luna MRN: [id number] Date of Birth: April 12, 1925

## 2017-12-27 ENCOUNTER — Ambulatory Visit: Payer: PPO

## 2017-12-27 DIAGNOSIS — R262 Difficulty in walking, not elsewhere classified: Secondary | ICD-10-CM

## 2017-12-27 DIAGNOSIS — M6281 Muscle weakness (generalized): Secondary | ICD-10-CM

## 2017-12-27 NOTE — Therapy (Signed)
[facility] [facility] PHYSICAL AND SPORTS MEDICINE [address] [place], [place], [address] Phone: [phone]   Fax:  [phone]  Physical Therapy Treatment  Patient Details  Name: Paula CornsRoxanne D Crossin MRN: [id number] Date of Birth: [birth year] Referring Provider: Karleen Hampshirespencer copland MD   Encounter Date: 12/27/2017  PT End of Session - 12/27/17 1439    Visit Number  9    Number of Visits  17    Date for PT Re-Evaluation  01/18/18    Authorization Type  3/ 6 FOTO    Authorization Time Period  9 / 10 Progress note    PT Start Time  1345    PT Stop Time  1430    PT Time Calculation (min)  45 min    Equipment Utilized During Treatment  Gait belt    Activity Tolerance  Patient tolerated treatment well;Patient limited by fatigue    Behavior During Therapy  WFL for tasks assessed/performed       Past Medical History:  Diagnosis Date  . Anemia   . Back pain   . Hypertension 12/25/2013  . Ischemic bowel disease (HCC)    a. 03/2017 abd pain-->internal herina and ischemic jejunum s/p ex lap and small bowel resection.  . Macular degeneration of left eye 01/06/2015  . Osteoporosis   . Persistent atrial fibrillation (HCC)    a. First noted 03/2017;  b. 07/2017 Echo: EF 60-65%, no rwma, mild MR, mildly to mod dil LA; c. 07/2017 48hr Holter: persistent Afib, avg HR of 66 (range 39-140 bpm);  d. CHA2DS2VASc = 4-->Xarelto initiated 07/2017.    Past Surgical History:  Procedure Laterality Date  . CLOSED REDUCTION PROXIMAL HUMERUS FRACTURE Left 2017  . LAPAROSCOPIC SMALL BOWEL RESECTION    . LAPAROTOMY N/A 03/18/2017   Procedure: EXPLORATORY LAPAROTOMY, SMALL BOWEL RESECTION;  Surgeon: Leafy RoPabon, Diego F, MD;  Location: [facility] ORS;  Service: General;  Laterality: N/A;  . TOTAL ABDOMINAL HYSTERECTOMY      There were no vitals filed for this visit.  Subjective Assessment - 12/27/17 1429    Subjective  Patient states on major changes reports her nephew needs to have brain surgery  and is nervous about his status.     Pertinent History  Small bowel surgery     Limitations  Standing;Walking    Patient Stated Goals  To walk without feeling of falling     Currently in Pain?  No/denies         TREATMENT:   Therapeutic Exercise Nustep level 5 at seat level 5 - x 8 min Ambulation around the gym -  23300ft x 2  Hip abduction at hip machine - x 10 B with 25# Forward step ups - 6" x 10  Sit to stands from chair - 2 x 10  Side stepping with airex beam - 10 x 756ft  Patient demonstrates increased fatigue at the end of the session.     PT Education - 12/27/17 1438    Education provided  Yes    Education Details  form/technique with exercise    Person(s) Educated  Patient    Methods  Explanation;Demonstration    Comprehension  Verbalized understanding;Returned demonstration          PT Long Term Goals - 11/23/17 1926      PT LONG TERM GOAL #1   Title  Patient will be independent with HEP focused on decreasing fall risk to continue benefits of therapy after discharge.     Baseline  Dependent with exercise performance    Time  6    Period  Weeks    Status  New    Target Date  01/04/18      PT LONG TERM GOAL #2   Title  Patient will improve TUG to under 12 sec to indicate singificant decrease in fall risk.     Baseline  20sec    Time  8    Period  Weeks    Status  New    Target Date  01/18/18      PT LONG TERM GOAL #3   Title  Patient will improve her ability to perform to 8m/s to indicate increased safety with walking at community ambulation    Baseline  .92m/s    Time  8    Period  Weeks    Status  New    Target Date  01/18/18      PT LONG TERM GOAL #4   Title  Patient will improve 5XSTS to under 16 sec to indicate significant improvement LE functional strength and decrease fall risk.    Baseline  30sec    Time  8    Period  Weeks    Status  New    Target Date  01/18/18            Plan - 12/27/17 1440    Clinical Impression  Statement  Patient demonstrates improvement with ability to perform greater amount of exercises today versus previous session and requires less breaks today. Patient continues to fatigue quickly but is overall improved compared to previous session. Patient will benefit from further skilled therapy to return to prior level of function.    Rehab Potential  Fair    Clinical Impairments Affecting Rehab Potential  (+) family support, decreased coormorbities (-) age    PT Frequency  2x / week    PT Duration  8 weeks    PT Treatment/Interventions  Balance training;Neuromuscular re-education;Gait training;Patient/family education;Therapeutic exercise;Therapeutic activities;Functional mobility training;Stair training;Moist Heat;Aquatic Therapy;Electrical Stimulation;Cryotherapy;Manual techniques    PT Next Visit Plan  Progress strengenthening    PT Home Exercise Plan  See education    Consulted and Agree with Plan of Care  Patient;Family member/caregiver    Family Member Consulted  daughter       Patient will benefit from skilled therapeutic intervention in order to improve the following deficits and impairments:  Abnormal gait, Decreased coordination, Decreased endurance, Decreased activity tolerance, Decreased strength, Decreased range of motion, Decreased balance, Difficulty walking  Visit Diagnosis: Muscle weakness (generalized)  Difficulty in walking, not elsewhere classified     Problem List Patient Active Problem List   Diagnosis Date Noted  . Dyspnea on exertion 06/27/2017  . Atrial fibrillation (HCC) 04/26/2017  . Anemia 04/26/2017  . 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 Macular degeneration of left eye 01/06/2015  . Essential hypertension 12/25/2013  . BACK PAIN 08/02/2010    Myrene Galas, PT DPT 12/27/2017, 2:43 PM  [place] [facility] PHYSICAL AND SPORTS MEDICINE [address] [place],  [address] Phone: [phone]   Fax:  [phone]  Name: Paula Luna MRN: [id number] Date of Birth: [birth year]

## 2018-01-01 ENCOUNTER — Ambulatory Visit: Payer: PPO

## 2018-01-01 DIAGNOSIS — M6281 Muscle weakness (generalized): Secondary | ICD-10-CM

## 2018-01-01 DIAGNOSIS — R262 Difficulty in walking, not elsewhere classified: Secondary | ICD-10-CM

## 2018-01-01 NOTE — Therapy (Signed)
[facility] PHYSICAL AND SPORTS MEDICINE [address] [place], [place], [address] Phone: [phone]   Fax:  [phone]  Physical Therapy Treatment Physical Therapy Progress Note   Dates of reporting period   11/23/2017  to   01/01/2018  Patient Details  Name: Paula Luna MRN: [id number] Date of Birth: [birth year] Referring Provider: Karleen Hampshirespencer copland MD   Encounter Date: 01/01/2018  PT End of Session - 01/01/18 1354    Visit Number  10    Number of Visits  17    Date for PT Re-Evaluation  01/18/18    Authorization Type  4/ 6 FOTO    Authorization Time Period  10 / 10 Progress note    PT Start Time  1345    PT Stop Time  1430    PT Time Calculation (min)  45 min    Equipment Utilized During Treatment  Gait belt    Activity Tolerance  Patient tolerated treatment well;Patient limited by fatigue    Behavior During Therapy  WFL for tasks assessed/performed       Past Medical History:  Diagnosis Date  . Anemia   . Back pain   . Hypertension 12/25/2013  . Ischemic bowel disease (HCC)    a. 03/2017 abd pain-->internal herina and ischemic jejunum s/p ex lap and small bowel resection.  . Macular degeneration of left eye 01/06/2015  . Osteoporosis   . Persistent atrial fibrillation (HCC)    a. First noted 03/2017;  b. 07/2017 Echo: EF 60-65%, no rwma, mild MR, mildly to mod dil LA; c. 07/2017 48hr Holter: persistent Afib, avg HR of 66 (range 39-140 bpm);  d. CHA2DS2VASc = 4-->Xarelto initiated 07/2017.    Past Surgical History:  Procedure Laterality Date  . CLOSED REDUCTION PROXIMAL HUMERUS FRACTURE Left 2017  . LAPAROSCOPIC SMALL BOWEL RESECTION    . LAPAROTOMY N/A 03/18/2017   Procedure: EXPLORATORY LAPAROTOMY, SMALL BOWEL RESECTION;  Surgeon: Leafy RoPabon, Diego F, MD;  Location: [facility] ORS;  Service: General;  Laterality: N/A;  . TOTAL ABDOMINAL HYSTERECTOMY      There were no vitals filed for this visit.  Subjective Assessment - 01/01/18 1353     Subjective  Patient reports no majot changes, states she is improving overall and feels a bit more confident with movement.     Pertinent History  Small bowel surgery     Limitations  Standing;Walking    Patient Stated Goals  To walk without feeling of falling     Currently in Pain?  No/denies       TREATMENT:   Therapeutic Exercise: Nustep level 6 at seat level 5 - x 6 min Sit to stands from chair - 2 x 5 Ambulation around the gym - 2 x 25950ft Standing and walking - 2 x 6320ft without AD   Patient demonstrates increased fatigue at the end of the session.     PT Education - 01/01/18 1354    Education provided  Yes    Education Details  form/technique with exercise    Person(s) Educated  Patient    Methods  Explanation;Demonstration    Comprehension  Verbalized understanding;Returned demonstration          PT Long Term Goals - 01/01/18 1359      PT LONG TERM GOAL #1   Title  Patient will be independent with HEP focused on decreasing fall risk to continue benefits of therapy after discharge.     Baseline  Dependent with exercise performance; 01/01/2018:  moderate cueing for performance.    Time  6    Period  Weeks    Status  On-going      PT LONG TERM GOAL #2   Title  Patient will improve TUG to under 12 sec to indicate singificant decrease in fall risk.     Baseline  20sec; 01/01/2018: 14sec    Time  8    Period  Weeks    Status  On-going      PT LONG TERM GOAL #3   Title  Patient will improve her ability to perform to 67m/s to indicate increased safety with walking at community ambulation    Baseline  .45m/s; 01/01/2018: .52m/s    Time  8    Period  Weeks    Status  On-going      PT LONG TERM GOAL #4   Title  Patient will improve 5XSTS to under 16 sec to indicate significant improvement LE functional strength and decrease fall risk.    Baseline  30sec; 01/01/2018: 12 sec    Time  8    Period  Weeks    Status  Achieved            Plan - 01/01/18  1410    Clinical Impression Statement  Patient is making improvement towards long term goals with significant improviement with TUG, 5XSTS, and indicating decreased fall risk. Although patient is improving, she continues to demonstrate increased fall risk with decreased TUG and test scores. Patient will benefit from further skilled therapy to return to prior level of function.     Rehab Potential  Fair    Clinical Impairments Affecting Rehab Potential  (+) family support, decreased coormorbities (-) age    PT Frequency  2x / week    PT Duration  8 weeks    PT Treatment/Interventions  Balance training;Neuromuscular re-education;Gait training;Patient/family education;Therapeutic exercise;Therapeutic activities;Functional mobility training;Stair training;Moist Heat;Aquatic Therapy;Electrical Stimulation;Cryotherapy;Manual techniques    PT Next Visit Plan  Progress strengenthening    PT Home Exercise Plan  See education    Consulted and Agree with Plan of Care  Patient;Family member/caregiver    Family Member Consulted  daughter       Patient will benefit from skilled therapeutic intervention in order to improve the following deficits and impairments:  Abnormal gait, Decreased coordination, Decreased endurance, Decreased activity tolerance, Decreased strength, Decreased range of motion, Decreased balance, Difficulty walking  Visit Diagnosis: Muscle weakness (generalized)  Difficulty in walking, not elsewhere classified     Problem List Patient Active Problem List   Diagnosis Date Noted  . Dyspnea on exertion 06/27/2017  . Atrial fibrillation (HCC) 04/26/2017  . Anemia 04/26/2017  . 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 Macular degeneration of left eye 01/06/2015  . Essential hypertension 12/25/2013  . BACK PAIN 08/02/2010    Myrene Galas, PT DPT 01/01/2018, 2:25 PM  [place] [facility] PHYSICAL  AND SPORTS MEDICINE [address], [place], [address] Phone: [phone]   Fax:  [phone]  Name: Paula Luna MRN: [id number] Date of Birth: Feb 08, 1925

## 2018-01-03 ENCOUNTER — Ambulatory Visit: Payer: PPO

## 2018-01-03 DIAGNOSIS — M6281 Muscle weakness (generalized): Secondary | ICD-10-CM | POA: Diagnosis not present

## 2018-01-03 DIAGNOSIS — R262 Difficulty in walking, not elsewhere classified: Secondary | ICD-10-CM

## 2018-01-03 NOTE — Therapy (Signed)
[facility] PH[facility]ORTS MEDICINE [address] [place], [address] Phone: [phone]   Fax:  [phone]  Physical Therapy Treatment  Patient Details  Name: Paula Luna MRN: [id number] Date of Birth: 1925-03-10 Referring Provider: Karleen Hampshire copland MD   Encounter Date: 01/03/2018  PT End of Session - 01/03/18 1359    Visit Number  11    Number of Visits  25    Date for PT Re-Evaluation  02/12/18    Authorization Type  5/ 6 FOTO    Authorization Time Period  1 / 10 Progress note    PT Start Time  1350    PT Stop Time  1430    PT Time Calculation (min)  40 min    Equipment Utilized During Treatment  Gait belt    Activity Tolerance  Patient tolerated treatment well;Patient limited by fatigue    Behavior During Therapy  WFL for tasks assessed/performed       Past Medical History:  Diagnosis Date  . Anemia   . Back pain   . Hypertension 12/25/2013  . Ischemic bowel disease (HCC)    a. 03/2017 abd pain-->internal herina and ischemic jejunum s/p ex lap and small bowel resection.  . Macular degeneration of left eye 01/06/2015  . Osteoporosis   . Persistent atrial fibrillation (HCC)    a. First noted 03/2017;  b. 07/2017 Echo: EF 60-65%, no rwma, mild MR, mildly to mod dil LA; c. 07/2017 48hr Holter: persistent Afib, avg HR of 66 (range 39-140 bpm);  d. CHA2DS2VASc = 4-->Xarelto initiated 07/2017.    Past Surgical History:  Procedure Laterality Date  . CLOSED REDUCTION PROXIMAL HUMERUS FRACTURE Left 2017  . LAPAROSCOPIC SMALL BOWEL RESECTION    . LAPAROTOMY N/A 03/18/2017   Procedure: EXPLORATORY LAPAROTOMY, SMALL BOWEL RESECTION;  Surgeon: Leafy Ro, MD;  Location: [facility] ORS;  Service: General;  Laterality: N/A;  . TOTAL ABDOMINAL HYSTERECTOMY      There were no vitals filed for this visit.  Subjective Assessment - 01/03/18 1358    Subjective  Patient reports she only has 2 therapy visits left and would like to continue  coming to physical therapy.     Pertinent History  Small bowel surgery     Limitations  Standing;Walking    Patient Stated Goals  To walk without feeling of falling     Currently in Pain?  No/denies           TREATMENT:   Therapeutic Exercise: Nustep level 5 at seat level 5 - x 8 min Ambulation around the gym - 2 x 285ft Side stepping up and over - x 10 with bilateral UE support, x10 with Unilateral support , x10 with single UE support with therapist support Forward step ups onto 4" step - x 5    Patient demonstrates increased fatigue at the end of the session.      PT Education - 01/03/18 1359    Education provided  Yes    Education Details  form/technique with exercise    Person(s) Educated  Patient    Methods  Explanation;Demonstration    Comprehension  Returned demonstration;Verbalized understanding          PT Long Term Goals - 01/01/18 1359      PT LONG TERM GOAL #1   Title  Patient will be independent with HEP focused on decreasing fall risk to continue benefits of therapy after discharge.     Baseline  Dependent  with exercise performance; 01/01/2018: moderate cueing for performance.    Time  6    Period  Weeks    Status  On-going      PT LONG TERM GOAL #2   Title  Patient will improve TUG to under 12 sec to indicate singificant decrease in fall risk.     Baseline  20sec; 01/01/2018: 14sec    Time  8    Period  Weeks    Status  On-going      PT LONG TERM GOAL #3   Title  Patient will improve her ability to perform 10mWT to 4044m/s to indicate increased safety with walking at community ambulation    Baseline  .9444m/s; 01/01/2018: .4613m/s    Time  8    Period  Weeks    Status  On-going      PT LONG TERM GOAL #4   Title  Patient will improve 5XSTS to under 16 sec to indicate significant improvement LE functional strength and decrease fall risk.    Baseline  30sec; 01/01/2018: 12 sec    Time  8    Period  Weeks    Status  Achieved            Plan -  01/03/18 1423    Clinical Impression Statement  Patient demonstrates increased confidence today with ability to step up onto a 4" step without UE support. Patient continues to have increased postural sway with performance on dynamic balance and strengthening exercises indicating continued increased fall risk and patient will benefit from further skilled therapy to return to prior level of function.     Rehab Potential  Fair    Clinical Impairments Affecting Rehab Potential  (+) family support, decreased coormorbities (-) age    PT Frequency  2x / week    PT Duration  8 weeks    PT Treatment/Interventions  Balance training;Neuromuscular re-education;Gait training;Patient/family education;Therapeutic exercise;Therapeutic activities;Functional mobility training;Stair training;Moist Heat;Aquatic Therapy;Electrical Stimulation;Cryotherapy;Manual techniques    PT Next Visit Plan  Progress strengenthening    PT Home Exercise Plan  See education    Consulted and Agree with Plan of Care  Patient;Family member/caregiver    Family Member Consulted  daughter       Patient will benefit from skilled therapeutic intervention in order to improve the following deficits and impairments:  Abnormal gait, Decreased coordination, Decreased endurance, Decreased activity tolerance, Decreased strength, Decreased range of motion, Decreased balance, Difficulty walking  Visit Diagnosis: Difficulty in walking, not elsewhere classified  Muscle weakness (generalized)     Problem List Patient Active Problem List   Diagnosis Date Noted  . Dyspnea on exertion 06/27/2017  . Atrial fibrillation (HCC) 04/26/2017  . Anemia 04/26/2017  . 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 Macular degeneration of left eye 01/06/2015  . Essential hypertension 12/25/2013  . BACK PAIN 08/02/2010    Myrene GalasWesley Bradey Luzier, PT DPT 01/03/2018, 2:33 PM  [facility]  PHYSICAL [facility] SPORTS MEDICINE [address] , [place], [address] Phone: [phone]   Fax:  [phone]  Name: Paula Luna MRN: [id number] Date of Birth: [birth year]

## 2018-01-08 ENCOUNTER — Ambulatory Visit: Payer: PPO

## 2018-01-08 DIAGNOSIS — R262 Difficulty in walking, not elsewhere classified: Secondary | ICD-10-CM

## 2018-01-08 DIAGNOSIS — M6281 Muscle weakness (generalized): Secondary | ICD-10-CM | POA: Diagnosis not present

## 2018-01-08 NOTE — Therapy (Signed)
[facility] PHYSICAL AND SPORTS MEDICINE [address], [place], [address] Phone: [phone]   Fax:  [phone]  Physical Therapy Treatment  Patient Details  Name: Paula Luna MRN: [id number] Date of Birth: Mar 25, 1925 Referring Provider: Karleen Hampshire copland MD   Encounter Date: 01/08/2018  PT End of Session - 01/08/18 1501    Visit Number  12    Number of Visits  25    Date for PT Re-Evaluation  02/12/18    Authorization Type  6/ 6 FOTO    Authorization Time Period  2 / 10 Progress note    PT Start Time  1400    PT Stop Time  1430    PT Time Calculation (min)  30 min    Equipment Utilized During Treatment  Gait belt    Activity Tolerance  Patient tolerated treatment well;Patient limited by fatigue    Behavior During Therapy  WFL for tasks assessed/performed       Past Medical History:  Diagnosis Date  . Anemia   . Back pain   . Hypertension 12/25/2013  . Ischemic bowel disease (HCC)    a. 03/2017 abd pain-->internal herina and ischemic jejunum s/p ex lap and small bowel resection.  . Macular degeneration of left eye 01/06/2015  . Osteoporosis   . Persistent atrial fibrillation (HCC)    a. First noted 03/2017;  b. 07/2017 Echo: EF 60-65%, no rwma, mild MR, mildly to mod dil LA; c. 07/2017 48hr Holter: persistent Afib, avg HR of 66 (range 39-140 bpm);  d. CHA2DS2VASc = 4-->Xarelto initiated 07/2017.    Past Surgical History:  Procedure Laterality Date  . CLOSED REDUCTION PROXIMAL HUMERUS FRACTURE Left 2017  . LAPAROSCOPIC SMALL BOWEL RESECTION    . LAPAROTOMY N/A 03/18/2017   Procedure: EXPLORATORY LAPAROTOMY, SMALL BOWEL RESECTION;  Surgeon: Leafy Ro, MD;  Location: [facility] ORS;  Service: General;  Laterality: N/A;  . TOTAL ABDOMINAL HYSTERECTOMY      There were no vitals filed for this visit.  Subjective Assessment - 01/08/18 1458    Subjective  Patient reports she did not eat today and is feeling slightly weak today.      Pertinent History  Small bowel surgery     Limitations  Standing;Walking    Patient Stated Goals  To walk without feeling of falling     Currently in Pain?  No/denies          TREATMENT:   Therapeutic Exercise: Nustep level 5 at seat level 5 - x 3 min, level 4 x76min  Ambulation around the gym - 3 x 124ft Ball kicks - x 10 performed B   Patient demonstrates increased fatigue at the end of the session.   PT Education - 01/08/18 1459    Education provided  Yes    Education Details  form/technique with exercise    Person(s) Educated  Patient    Methods  Explanation;Demonstration    Comprehension  Verbalized understanding;Returned demonstration          PT Long Term Goals - 01/01/18 1359      PT LONG TERM GOAL #1   Title  Patient will be independent with HEP focused on decreasing fall risk to continue benefits of therapy after discharge.     Baseline  Dependent with exercise performance; 01/01/2018: moderate cueing for performance.    Time  6    Period  Weeks    Status  On-going      PT LONG  TERM GOAL #2   Title  Patient will improve TUG to under 12 sec to indicate singificant decrease in fall risk.     Baseline  20sec; 01/01/2018: 14sec    Time  8    Period  Weeks    Status  On-going      PT LONG TERM GOAL #3   Title  Patient will improve her ability to perform to 8m/s to indicate increased safety with walking at community ambulation    Baseline  .38m/s; 01/01/2018: .71m/s    Time  8    Period  Weeks    Status  On-going      PT LONG TERM GOAL #4   Title  Patient will improve 5XSTS to under 16 sec to indicate significant improvement LE functional strength and decrease fall risk.    Baseline  30sec; 01/01/2018: 12 sec    Time  8    Period  Weeks    Status  Achieved            Plan - 01/08/18 1501    Clinical Impression Statement  Patient is 15 min late for appointment therefore performed a shortened session today. Patient demonstrates increased  fatigue today and took longer resting breaks today secondary to fatigue. Patient reports not eating lunch today potentially contributing to increased fatigue. Patient will benefit from further skilled therapy to return to prior level of function.     Rehab Potential  Fair    Clinical Impairments Affecting Rehab Potential  (+) family support, decreased coormorbities (-) age    PT Frequency  2x / week    PT Duration  8 weeks    PT Treatment/Interventions  Balance training;Neuromuscular re-education;Gait training;Patient/family education;Therapeutic exercise;Therapeutic activities;Functional mobility training;Stair training;Moist Heat;Aquatic Therapy;Electrical Stimulation;Cryotherapy;Manual techniques    PT Next Visit Plan  Progress strengenthening    PT Home Exercise Plan  See education    Consulted and Agree with Plan of Care  Patient;Family member/caregiver    Family Member Consulted  daughter       Patient will benefit from skilled therapeutic intervention in order to improve the following deficits and impairments:  Abnormal gait, Decreased coordination, Decreased endurance, Decreased activity tolerance, Decreased strength, Decreased range of motion, Decreased balance, Difficulty walking  Visit Diagnosis: Muscle weakness (generalized)  Difficulty in walking, not elsewhere classified     Problem List Patient Active Problem List   Diagnosis Date Noted  . Dyspnea on exertion 06/27/2017  . Atrial fibrillation (HCC) 04/26/2017  . Anemia 04/26/2017  . 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 Macular degeneration of left eye 01/06/2015  . Essential hypertension 12/25/2013  . BACK PAIN 08/02/2010    Myrene Galas 01/08/2018, 3:03 PM  [facility] PHYSICAL AND SPORTS MEDICINE [address] [place], [address] Phone: [phone]   Fax:  [phone]  Name: LAVITA PONTIUS MRN: [id number] Date  of Birth: [birth year]

## 2018-01-10 ENCOUNTER — Ambulatory Visit: Payer: PPO | Attending: Family Medicine

## 2018-01-10 DIAGNOSIS — R262 Difficulty in walking, not elsewhere classified: Secondary | ICD-10-CM | POA: Insufficient documentation

## 2018-01-10 DIAGNOSIS — M6281 Muscle weakness (generalized): Secondary | ICD-10-CM

## 2018-01-10 NOTE — Therapy (Signed)
[place] [facility] PH[facility] MEDICINE [address] [place], [address] Phone: [phone]   Fax:  [phone]  Physical Therapy Treatment  Patient Details  Name: Paula Luna MRN: [id number] Date of Birth: 1925/07/22 Referring Provider: Karleen Hampshire copland MD   Encounter Date: 01/10/2018  PT End of Session - 01/10/18 1402    Visit Number  13    Number of Visits  25    Date for PT Re-Evaluation  02/12/18    Authorization Type  6/ 6 FOTO    Authorization Time Period  2 / 10 Progress note    PT Start Time  1347    PT Stop Time  1430    PT Time Calculation (min)  43 min    Equipment Utilized During Treatment  Gait belt    Activity Tolerance  Patient tolerated treatment well;Patient limited by fatigue    Behavior During Therapy  WFL for tasks assessed/performed       Past Medical History:  Diagnosis Date  . Anemia   . Back pain   . Hypertension 12/25/2013  . Ischemic bowel disease (HCC)    a. 03/2017 abd pain-->internal herina and ischemic jejunum s/p ex lap and small bowel resection.  . Macular degeneration of left eye 01/06/2015  . Osteoporosis   . Persistent atrial fibrillation (HCC)    a. First noted 03/2017;  b. 07/2017 Echo: EF 60-65%, no rwma, mild MR, mildly to mod dil LA; c. 07/2017 48hr Holter: persistent Afib, avg HR of 66 (range 39-140 bpm);  d. CHA2DS2VASc = 4-->Xarelto initiated 07/2017.    Past Surgical History:  Procedure Laterality Date  . CLOSED REDUCTION PROXIMAL HUMERUS FRACTURE Left 2017  . LAPAROSCOPIC SMALL BOWEL RESECTION    . LAPAROTOMY N/A 03/18/2017   Procedure: EXPLORATORY LAPAROTOMY, SMALL BOWEL RESECTION;  Surgeon: Leafy Ro, MD;  Location: [facility] ORS;  Service: General;  Laterality: N/A;  . TOTAL ABDOMINAL HYSTERECTOMY      There were no vitals filed for this visit.  Subjective Assessment - 01/10/18 1359    Subjective  Patient reports she feels increased fatigue today and states she does "not  want to do anything fast".    Pertinent History  Small bowel surgery     Limitations  Standing;Walking    Patient Stated Goals  To walk without feeling of falling     Currently in Pain?  No/denies         TREATMENT:   Therapeutic Exercise: Nustep seat level 6 at seat level 4 ---  x55min  Ambulation around the gym - 3 x 158ft Backwards walking - 2 x 6ft Forward walking with 2 x 181ft Hip abduction in standing with UE support - 2 x 20 Squats in standing - 2 x 20    Patient demonstrates increased fatigue at the end of the session.  PT Education - 01/10/18 1401    Education provided  Yes    Education Details  form/technique with exercise    Person(s) Educated  Patient    Methods  Explanation;Demonstration    Comprehension  Verbalized understanding;Returned demonstration          PT Long Term Goals - 01/01/18 1359      PT LONG TERM GOAL #1   Title  Patient will be independent with HEP focused on decreasing fall risk to continue benefits of therapy after discharge.     Baseline  Dependent with exercise performance; 01/01/2018: moderate cueing for performance.  Time  6    Period  Weeks    Status  On-going      PT LONG TERM GOAL #2   Title  Patient will improve TUG to under 12 sec to indicate singificant decrease in fall risk.     Baseline  20sec; 01/01/2018: 14sec    Time  8    Period  Weeks    Status  On-going      PT LONG TERM GOAL #3   Title  Patient will improve her ability to perform to 58m/s to indicate increased safety with walking at community ambulation    Baseline  .48m/s; 01/01/2018: .75m/s    Time  8    Period  Weeks    Status  On-going      PT LONG TERM GOAL #4   Title  Patient will improve 5XSTS to under 16 sec to indicate significant improvement LE functional strength and decrease fall risk.    Baseline  30sec; 01/01/2018: 12 sec    Time  8    Period  Weeks    Status  Achieved            Plan - 01/10/18 1415    Clinical Impression  Statement  Patient demonstrates improvement in fatigue with exercise today versus previous session. Patient is able to perform a greater mount of exercises and takes shorter rest breaks  compared to previously. Patient demonstrates difficulty performing backwards stepping indicating decreased balance. Patient will benefit from further skilled therapy to return to prior level of function.    Rehab Potential  Fair    Clinical Impairments Affecting Rehab Potential  (+) family support, decreased coormorbities (-) age    PT Frequency  2x / week    PT Duration  8 weeks    PT Treatment/Interventions  Balance training;Neuromuscular re-education;Gait training;Patient/family education;Therapeutic exercise;Therapeutic activities;Functional mobility training;Stair training;Moist Heat;Aquatic Therapy;Electrical Stimulation;Cryotherapy;Manual techniques    PT Next Visit Plan  Progress strengenthening    PT Home Exercise Plan  See education    Consulted and Agree with Plan of Care  Patient;Family member/caregiver    Family Member Consulted  daughter       Patient will benefit from skilled therapeutic intervention in order to improve the following deficits and impairments:  Abnormal gait, Decreased coordination, Decreased endurance, Decreased activity tolerance, Decreased strength, Decreased range of motion, Decreased balance, Difficulty walking  Visit Diagnosis: Difficulty in walking, not elsewhere classified  Muscle weakness (generalized)     Problem List Patient Active Problem List   Diagnosis Date Noted  . Dyspnea on exertion 06/27/2017  . Atrial fibrillation (HCC) 04/26/2017  . Anemia 04/26/2017  . 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 Macular degeneration of left eye 01/06/2015  . Essential hypertension 12/25/2013  . BACK PAIN 08/02/2010    Myrene Galas 01/10/2018, 2:34 PM  [place] [facility] PHYSICAL AND SPORTS  MEDICINE [address], [place], [address] Phone: [phone]   Fax:  [phone]  Name: MELEK POWNALL MRN: [id number] Date of Birth: [birth year]

## 2018-01-15 ENCOUNTER — Encounter: Payer: Self-pay | Admitting: Physical Therapy

## 2018-01-15 ENCOUNTER — Ambulatory Visit: Payer: PPO | Admitting: Physical Therapy

## 2018-01-15 ENCOUNTER — Ambulatory Visit: Payer: Self-pay

## 2018-01-15 DIAGNOSIS — R262 Difficulty in walking, not elsewhere classified: Secondary | ICD-10-CM

## 2018-01-15 DIAGNOSIS — M6281 Muscle weakness (generalized): Secondary | ICD-10-CM

## 2018-01-15 NOTE — Telephone Encounter (Signed)
Ms. Asby daughter, [name] phoned back to office. She reports the patient has not had any further episodes of diarrhea since early this morning. Reviewed care advice on maintaining hydration and a bland diet. Monitor stools for additional diarrhea. Call back in the morning if she continues to have diarrhea.

## 2018-01-15 NOTE — Therapy (Signed)
[place] [facility] PHYS[facility]ORTS MEDICINE [address] [place], [address] Phone: [phone]   Fax:  [phone]  Physical Therapy Treatment  Patient Details  Name: Paula Luna MRN: [id number] Date of Birth: Dec 23, 1924 Referring Provider: Karleen Hampshire copland MD   Encounter Date: 01/15/2018  PT End of Session - 01/15/18 1038    Visit Number  14    Number of Visits  25    Date for PT Re-Evaluation  02/12/18    Authorization Type  7/ 6 FOTO    Authorization Time Period  3 / 10 Progress note    PT Start Time  1038    PT Stop Time  1104    PT Time Calculation (min)  26 min    Equipment Utilized During Treatment  Gait belt    Activity Tolerance  Patient tolerated treatment well;Patient limited by fatigue    Behavior During Therapy  WFL for tasks assessed/performed       Past Medical History:  Diagnosis Date  . Anemia   . Back pain   . Hypertension 12/25/2013  . Ischemic bowel disease (HCC)    a. 03/2017 abd pain-->internal herina and ischemic jejunum s/p ex lap and small bowel resection.  . Macular degeneration of left eye 01/06/2015  . Osteoporosis   . Persistent atrial fibrillation (HCC)    a. First noted 03/2017;  b. 07/2017 Echo: EF 60-65%, no rwma, mild MR, mildly to mod dil LA; c. 07/2017 48hr Holter: persistent Afib, avg HR of 66 (range 39-140 bpm);  d. CHA2DS2VASc = 4-->Xarelto initiated 07/2017.    Past Surgical History:  Procedure Laterality Date  . CLOSED REDUCTION PROXIMAL HUMERUS FRACTURE Left 2017  . LAPAROSCOPIC SMALL BOWEL RESECTION    . LAPAROTOMY N/A 03/18/2017   Procedure: EXPLORATORY LAPAROTOMY, SMALL BOWEL RESECTION;  Surgeon: Leafy Ro, MD;  Location: [facility] ORS;  Service: General;  Laterality: N/A;  . TOTAL ABDOMINAL HYSTERECTOMY      There were no vitals filed for this visit.  Subjective Assessment - 01/15/18 1037    Subjective  Pt arrived late, limiting session. Pt reports she has had 4 episodes of  diarrhea already this morning (onset this morning).  Her stomach is feeling fine.  She does want to try exercises today.      Pertinent History  Small bowel surgery     Limitations  Standing;Walking    Patient Stated Goals  To walk without feeling of falling     Currently in Pain?  No/denies    Multiple Pain Sites  No        TREATMENT BP 113/54, SpO2 99%, pulse 72-85 in sitting at start of session Ambulation around the gym - 2 x 157ft.  Seated rest break between each bout.  Backwards walking - 2 x 46ft.  Pulse up to 124 with this.  Instructed pt to sit with pulse then suddenly down to 76. BP reading 146/68. SpO2 99%. Pt then rested in chair for ~10 minutes with HR remaining in the 70s and low 80s.    Discontinued session at this time and this PT called Dr. Karleen Hampshire Copland's office.                         PT Education - 01/15/18 1037    Education provided  Yes    Education Details  Exercise technique    Person(s) Educated  Patient    Methods  Explanation;Demonstration;Verbal cues  Comprehension  Verbalized understanding;Returned demonstration;Verbal cues required;Need further instruction          PT Long Term Goals - 01/01/18 1359      PT LONG TERM GOAL #1   Title  Patient will be independent with HEP focused on decreasing fall risk to continue benefits of therapy after discharge.     Baseline  Dependent with exercise performance; 01/01/2018: moderate cueing for performance.    Time  6    Period  Weeks    Status  On-going      PT LONG TERM GOAL #2   Title  Patient will improve TUG to under 12 sec to indicate singificant decrease in fall risk.     Baseline  20sec; 01/01/2018: 14sec    Time  8    Period  Weeks    Status  On-going      PT LONG TERM GOAL #3   Title  Patient will improve her ability to perform to 6m/s to indicate increased safety with walking at community ambulation    Baseline  .72m/s; 01/01/2018: .39m/s    Time  8    Period   Weeks    Status  On-going      PT LONG TERM GOAL #4   Title  Patient will improve 5XSTS to under 16 sec to indicate significant improvement LE functional strength and decrease fall risk.    Baseline  30sec; 01/01/2018: 12 sec    Time  8    Period  Weeks    Status  Achieved            Plan - 01/15/18 1043    Clinical Impression Statement  Pt felt fine throughout session; however, pt's pulse elevated to 124 with backward walking and then quickly decreased to the 70s and low 80s where it remained when sitting and resting.  This PT called Dr. Karleen Hampshire Copland's office (pt's PCP) to notify them of pt's symptoms. Spoke with RN and informed her of pt's pulse and diarrhea.  RN sending information to MD and if pt needs to follow up with the MD the office will give her a call.  Pt will benefit from continued skilled PT interventions for improved ambulatory endurance and balance.      Rehab Potential  Fair    Clinical Impairments Affecting Rehab Potential  (+) family support, decreased coormorbities (-) age    PT Frequency  2x / week    PT Duration  8 weeks    PT Treatment/Interventions  Balance training;Neuromuscular re-education;Gait training;Patient/family education;Therapeutic exercise;Therapeutic activities;Functional mobility training;Stair training;Moist Heat;Aquatic Therapy;Electrical Stimulation;Cryotherapy;Manual techniques    PT Next Visit Plan  Progress strengenthening    PT Home Exercise Plan  See education    Consulted and Agree with Plan of Care  Patient;Family member/caregiver    Family Member Consulted  daughter       Patient will benefit from skilled therapeutic intervention in order to improve the following deficits and impairments:  Abnormal gait, Decreased coordination, Decreased endurance, Decreased activity tolerance, Decreased strength, Decreased range of motion, Decreased balance, Difficulty walking  Visit Diagnosis: Difficulty in walking, not elsewhere  classified  Muscle weakness (generalized)     Problem List Patient Active Problem List   Diagnosis Date Noted  . Dyspnea on exertion 06/27/2017  . Atrial fibrillation (HCC) 04/26/2017  . Anemia 04/26/2017  . 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 Macular degeneration of left eye 01/06/2015  .  Essential hypertension 12/25/2013  . BACK PAIN 08/02/2010    Encarnacion Chu PT, DPT 01/15/2018, 11:17 AM  [facility] [facility] PHYSICAL AND SPORTS MEDICINE [address], [place], [address] Phone: [phone]   Fax:  [phone]  Name: Paula Luna MRN: [id number] Date of Birth: 09-08-1925

## 2018-01-15 NOTE — Telephone Encounter (Signed)
Morrie Sheldon, PT at [facility] called to say "the patient says she had 4 episodes of diarrhea this morning before coming to therapy. When she got here, she said she felt fine, just a little weaker than normal, but ok. Her vitals were all WNL. She got up and was walking, her heart rate increased to 124. I had her to sit down and immediately it went back to 70's-80's at rest. She says she felt fine, no symptoms with the elevation." I advised this would be sent to the provider and someone will contact her with his recommendation.  Reason for Disposition . General information question, no triage required and triager able to answer question  Answer Assessment - Initial Assessment Questions 1. REASON FOR CALL or QUESTION: "What is your reason for calling today?" or "How can I best help you?" or "What question do you have that I can help answer?"     Diarrhea and elevated heart rate  Protocols used: INFORMATION ONLY CALL-A-AH

## 2018-01-15 NOTE — Telephone Encounter (Signed)
Left message for [name] that Dr. Patsy Lager received a call from Morrie Sheldon at [facility] PT about Paula Luna have 4 episodes of diarrhea this morning.  Advised Dr. Patsy Lager wants to make sure that Ms. Eimer is staying hydrated.  I ask that if she has any questions or concerns to please call us back.

## 2018-01-15 NOTE — Telephone Encounter (Signed)
Can you talk to her about diarrhea? Primarily I would be sure to stay hydrated.

## 2018-01-18 ENCOUNTER — Ambulatory Visit: Payer: PPO

## 2018-01-18 DIAGNOSIS — M6281 Muscle weakness (generalized): Secondary | ICD-10-CM

## 2018-01-18 DIAGNOSIS — R262 Difficulty in walking, not elsewhere classified: Secondary | ICD-10-CM

## 2018-01-18 NOTE — Therapy (Signed)
[place] [facility] PHYS[facility] SPORTS MEDICINE [address], [place], [address] Phone: [phone]   Fax:  [phone]  Physical Therapy Treatment  Patient Details  Name: Paula Luna MRN: [id number] Date of Birth: 06/28/1925 Referring Provider: Karleen Hampshire copland MD   Encounter Date: 01/18/2018  PT End of Session - 01/18/18 1148    Visit Number  15    Number of Visits  25    Date for PT Re-Evaluation  02/12/18    Authorization Type  1/ 6 FOTO    Authorization Time Period  4/ 10 Progress note    PT Start Time  1115    PT Stop Time  1200    PT Time Calculation (min)  45 min    Equipment Utilized During Treatment  Gait belt    Activity Tolerance  Patient tolerated treatment well;Patient limited by fatigue    Behavior During Therapy  WFL for tasks assessed/performed       Past Medical History:  Diagnosis Date  . Anemia   . Back pain   . Hypertension 12/25/2013  . Ischemic bowel disease (HCC)    a. 03/2017 abd pain-->internal herina and ischemic jejunum s/p ex lap and small bowel resection.  . Macular degeneration of left eye 01/06/2015  . Osteoporosis   . Persistent atrial fibrillation (HCC)    a. First noted 03/2017;  b. 07/2017 Echo: EF 60-65%, no rwma, mild MR, mildly to mod dil LA; c. 07/2017 48hr Holter: persistent Afib, avg HR of 66 (range 39-140 bpm);  d. CHA2DS2VASc = 4-->Xarelto initiated 07/2017.    Past Surgical History:  Procedure Laterality Date  . CLOSED REDUCTION PROXIMAL HUMERUS FRACTURE Left 2017  . LAPAROSCOPIC SMALL BOWEL RESECTION    . LAPAROTOMY N/A 03/18/2017   Procedure: EXPLORATORY LAPAROTOMY, SMALL BOWEL RESECTION;  Surgeon: Leafy Ro, MD;  Location: [facility] ORS;  Service: General;  Laterality: N/A;  . TOTAL ABDOMINAL HYSTERECTOMY      There were no vitals filed for this visit.  Subjective Assessment - 01/18/18 1137    Subjective  Patient reports she feels she's geting better at standing and walking for long  periods of time. Patient states she has been walking more today.     Pertinent History  Small bowel surgery     Limitations  Standing;Walking    Patient Stated Goals  To walk without feeling of falling     Currently in Pain?  No/denies        TREATMENT:   Therapeutic Exercise: Nustep seat level 6 at seat level 3 ---  x91min  Ambulation around the gym - x 117ft, x266ft Step ups onto 6" step -  x 10 performed B  Side stepping in standing with airex beam - x 10 Forward tandem ambulation with UE support - x 10  Patient demonstrates increased fatigue at the end of the session.   PT Education - 01/18/18 1139    Education provided  Yes    Education Details  form/technique with exercise    Person(s) Educated  Patient    Methods  Explanation;Demonstration    Comprehension  Verbalized understanding;Returned demonstration          PT Long Term Goals - 01/01/18 1359      PT LONG TERM GOAL #1   Title  Patient will be independent with HEP focused on decreasing fall risk to continue benefits of therapy after discharge.     Baseline  Dependent with exercise performance; 01/01/2018: moderate  cueing for performance.    Time  6    Period  Weeks    Status  On-going      PT LONG TERM GOAL #2   Title  Patient will improve TUG to under 12 sec to indicate singificant decrease in fall risk.     Baseline  20sec; 01/01/2018: 14sec    Time  8    Period  Weeks    Status  On-going      PT LONG TERM GOAL #3   Title  Patient will improve her ability to perform to 5m/s to indicate increased safety with walking at community ambulation    Baseline  .35m/s; 01/01/2018: .10m/s    Time  8    Period  Weeks    Status  On-going      PT LONG TERM GOAL #4   Title  Patient will improve 5XSTS to under 16 sec to indicate significant improvement LE functional strength and decrease fall risk.    Baseline  30sec; 01/01/2018: 12 sec    Time  8    Period  Weeks    Status  Achieved            Plan  - 01/18/18 1149    Clinical Impression Statement  Continued to focus on progress strengthening with patient in standing positions to improve ability to balance most notably in dynamic positions. Patient requires verbal cueing to perform exercises with less UE support and is able to perform after cueing. Patient demonstrates decreased confidence with dynamic exercise and will benefit from further skilled therapy to return to prior level of function.     Rehab Potential  Fair    Clinical Impairments Affecting Rehab Potential  (+) family support, decreased coormorbities (-) age    PT Frequency  2x / week    PT Duration  8 weeks    PT Treatment/Interventions  Balance training;Neuromuscular re-education;Gait training;Patient/family education;Therapeutic exercise;Therapeutic activities;Functional mobility training;Stair training;Moist Heat;Aquatic Therapy;Electrical Stimulation;Cryotherapy;Manual techniques    PT Next Visit Plan  Progress strengenthening    PT Home Exercise Plan  See education    Consulted and Agree with Plan of Care  Patient;Family member/caregiver    Family Member Consulted  daughter       Patient will benefit from skilled therapeutic intervention in order to improve the following deficits and impairments:  Abnormal gait, Decreased coordination, Decreased endurance, Decreased activity tolerance, Decreased strength, Decreased range of motion, Decreased balance, Difficulty walking  Visit Diagnosis: Difficulty in walking, not elsewhere classified  Muscle weakness (generalized)     Problem List Patient Active Problem List   Diagnosis Date Noted  . Dyspnea on exertion 06/27/2017  . Atrial fibrillation (HCC) 04/26/2017  . Anemia 04/26/2017  . 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 Macular degeneration of left eye 01/06/2015  . Essential hypertension 12/25/2013  . BACK PAIN 08/02/2010    Myrene Galas, PT DPT 01/18/2018, 11:52  AM  [place] [facility] PHYSICAL AND SPORTS MEDICINE [address] [place], [address] Phone: [phone]   Fax:  [phone]  Name: JALEYAH LONGHI MRN: [id number] Date of Birth: [birth year]

## 2018-01-22 ENCOUNTER — Ambulatory Visit: Payer: PPO

## 2018-01-22 DIAGNOSIS — R262 Difficulty in walking, not elsewhere classified: Secondary | ICD-10-CM | POA: Diagnosis not present

## 2018-01-22 DIAGNOSIS — M6281 Muscle weakness (generalized): Secondary | ICD-10-CM

## 2018-01-22 NOTE — Therapy (Signed)
[facility] [address] [place], [address] Phone: [phone]   Fax:  [phone]  Physical Therapy Treatment  Patient Details  Name: Paula Luna MRN: [id number] Date of Birth: [birth year] Referring Provider: Karleen Hampshire copland MD   Encounter Date: 01/22/2018  PT End of Session - 01/22/18 1204    Visit Number  16    Number of Visits  25    Date for PT Re-Evaluation  02/12/18    Authorization Type  2/ 6 FOTO    Authorization Time Period  5/ 10 Progress note    PT Start Time  1115    PT Stop Time  1200    PT Time Calculation (min)  45 min    Equipment Utilized During Treatment  Gait belt    Activity Tolerance  Patient tolerated treatment well;Patient limited by fatigue    Behavior During Therapy  WFL for tasks assessed/performed       Past Medical History:  Diagnosis Date  . Anemia   . Back pain   . Hypertension 12/25/2013  . Ischemic bowel disease (HCC)    a. 03/2017 abd pain-->internal herina and ischemic jejunum s/p ex lap and small bowel resection.  . Macular degeneration of left eye 01/06/2015  . Osteoporosis   . Persistent atrial fibrillation (HCC)    a. First noted 03/2017;  b. 07/2017 Echo: EF 60-65%, no rwma, mild MR, mildly to mod dil LA; c. 07/2017 48hr Holter: persistent Afib, avg HR of 66 (range 39-140 bpm);  d. CHA2DS2VASc = 4-->Xarelto initiated 07/2017.    Past Surgical History:  Procedure Laterality Date  . CLOSED REDUCTION PROXIMAL HUMERUS FRACTURE Left 2017  . LAPAROSCOPIC SMALL BOWEL RESECTION    . LAPAROTOMY N/A 03/18/2017   Procedure: EXPLORATORY LAPAROTOMY, SMALL BOWEL RESECTION;  Surgeon: Leafy Ro, MD;  Location: [facility] ORS;  Service: General;  Laterality: N/A;  . TOTAL ABDOMINAL HYSTERECTOMY      There were no vitals filed for this visit.  Subjective Assessment - 01/22/18 1134    Subjective  Patient reports she's feeling a little tired today and states she was feeling  a little sick this morning, but is overall improving as the day progresses.     Pertinent History  Small bowel surgery     Limitations  Standing;Walking    Patient Stated Goals  To walk without feeling of falling     Currently in Pain?  No/denies       TREATMENT:   Therapeutic Exercise: Nustep seat level 5 at seat level 5 resistance --  x75min  Ambulation around the gym - 2 x287ft Step ups onto 6" step -  x 10 performed B  Cone lifts in standing on airex pad - 3 cones x 2 ; without airex pad 3 cones x 2 Side stepping up and over airex pad - x 10  Patient demonstrates increased fatigue at the end of the session.   PT Education - 01/22/18 1203    Education provided  Yes    Education Details  form/technique with exercise    Person(s) Educated  Patient    Methods  Explanation;Demonstration    Comprehension  Verbalized understanding;Returned demonstration          PT Long Term Goals - 01/01/18 1359      PT LONG TERM GOAL #1   Title  Patient will be independent with HEP focused on decreasing fall risk to continue benefits of therapy after discharge.  Baseline  Dependent with exercise performance; 01/01/2018: moderate cueing for performance.    Time  6    Period  Weeks    Status  On-going      PT LONG TERM GOAL #2   Title  Patient will improve TUG to under 12 sec to indicate singificant decrease in fall risk.     Baseline  20sec; 01/01/2018: 14sec    Time  8    Period  Weeks    Status  On-going      PT LONG TERM GOAL #3   Title  Patient will improve her ability to perform to 25m/s to indicate increased safety with walking at community ambulation    Baseline  .59m/s; 01/01/2018: .17m/s    Time  8    Period  Weeks    Status  On-going      PT LONG TERM GOAL #4   Title  Patient will improve 5XSTS to under 16 sec to indicate significant improvement LE functional strength and decrease fall risk.    Baseline  30sec; 01/01/2018: 12 sec    Time  8    Period  Weeks     Status  Achieved            Plan - 01/22/18 1244    Clinical Impression Statement  Patient demosntrats decreased confidence wth performing steppping and walking on unstable surfaces without use of UE support indicating decreased confidence and dynamic balance. Patient is progressingin strenth however with ability to perform greater maount of step ups before onset on fatigue. Patient will benefit from further skilled therapy to return to prior level of function.     Rehab Potential  Fair    Clinical Impairments Affecting Rehab Potential  (+) family support, decreased coormorbities (-) age    PT Frequency  2x / week    PT Duration  8 weeks    PT Treatment/Interventions  Balance training;Neuromuscular re-education;Gait training;Patient/family education;Therapeutic exercise;Therapeutic activities;Functional mobility training;Stair training;Moist Heat;Aquatic Therapy;Electrical Stimulation;Cryotherapy;Manual techniques    PT Next Visit Plan  Progress strengenthening    PT Home Exercise Plan  See education    Consulted and Agree with Plan of Care  Patient;Family member/caregiver    Family Member Consulted  daughter       Patient will benefit from skilled therapeutic intervention in order to improve the following deficits and impairments:  Abnormal gait, Decreased coordination, Decreased endurance, Decreased activity tolerance, Decreased strength, Decreased range of motion, Decreased balance, Difficulty walking  Visit Diagnosis: Muscle weakness (generalized)  Difficulty in walking, not elsewhere classified     Problem List Patient Active Problem List   Diagnosis Date Noted  . Dyspnea on exertion 06/27/2017  . Atrial fibrillation (HCC) 04/26/2017  . Anemia 04/26/2017  . 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 Macular degeneration of left eye 01/06/2015  . Essential hypertension 12/25/2013  . BACK PAIN 08/02/2010    Myrene Galas, PT  DPT 01/22/2018, 12:59 PM  [facility] PHYSICAL AND SPORTS MEDICINE [address] [place], [address] Phone: [phone]   Fax:  [phone]  Name: Paula Luna MRN: [id number] Date of Birth: Mar 06, 1925

## 2018-01-24 ENCOUNTER — Ambulatory Visit: Payer: PPO

## 2018-01-29 ENCOUNTER — Ambulatory Visit: Payer: PPO

## 2018-01-29 DIAGNOSIS — R262 Difficulty in walking, not elsewhere classified: Secondary | ICD-10-CM | POA: Diagnosis not present

## 2018-01-29 DIAGNOSIS — M6281 Muscle weakness (generalized): Secondary | ICD-10-CM

## 2018-01-29 NOTE — Therapy (Signed)
[facility]TS [facility] [address], [place], [address] Phone: [phone]   Fax:  [phone]  Physical Therapy Treatment  Patient Details  Name: Paula Luna MRN: [id number] Date of Birth: [birth year] Referring Provider: Karleen Hampshire copland MD   Encounter Date: 01/29/2018  PT End of Session - 01/29/18 1400    Visit Number  17    Number of Visits  25    Date for PT Re-Evaluation  02/12/18    Authorization Type  3/ 6 FOTO    Authorization Time Period  6/ 10 Progress note    PT Start Time  1345    PT Stop Time  1430    PT Time Calculation (min)  45 min    Equipment Utilized During Treatment  Gait belt    Activity Tolerance  Patient tolerated treatment well;Patient limited by fatigue    Behavior During Therapy  WFL for tasks assessed/performed       Past Medical History:  Diagnosis Date  . Anemia   . Back pain   . Hypertension 12/25/2013  . Ischemic bowel disease (HCC)    a. 03/2017 abd pain-->internal herina and ischemic jejunum s/p ex lap and small bowel resection.  . Macular degeneration of left eye 01/06/2015  . Osteoporosis   . Persistent atrial fibrillation (HCC)    a. First noted 03/2017;  b. 07/2017 Echo: EF 60-65%, no rwma, mild MR, mildly to mod dil LA; c. 07/2017 48hr Holter: persistent Afib, avg HR of 66 (range 39-140 bpm);  d. CHA2DS2VASc = 4-->Xarelto initiated 07/2017.    Past Surgical History:  Procedure Laterality Date  . CLOSED REDUCTION PROXIMAL HUMERUS FRACTURE Left 2017  . LAPAROSCOPIC SMALL BOWEL RESECTION    . LAPAROTOMY N/A 03/18/2017   Procedure: EXPLORATORY LAPAROTOMY, SMALL BOWEL RESECTION;  Surgeon: Leafy Ro, MD;  Location: [facility];  Service: General;  Laterality: N/A;  . TOTAL ABDOMINAL HYSTERECTOMY      There were no vitals filed for this visit.  Subjective Assessment - 01/29/18 1354    Subjective  Patient reports she's improving but states she felt sick last Thursday which  is why she missed her last appointment.     Pertinent History  Small bowel surgery     Limitations  Standing;Walking    Patient Stated Goals  To walk without feeling of falling     Currently in Pain?  No/denies       TREATMENT:   Therapeutic Exercise: Nustep seat level 4 at seat level 5 resistance --  x93min  Ambulation around the gym - x172ft, x100, x150 Step ups onto 6" step - 2 x 5 performed B  Heel raises on airex pad - x 15 Turning on airex pad with touching hands overhead - x 8 B    Patient demonstrates increased fatigue at the end of the session.   PT Education - 01/29/18 1359    Education provided  Yes    Education Details  form/technique with exercise    Person(s) Educated  Patient    Methods  Explanation;Demonstration    Comprehension  Verbalized understanding;Returned demonstration          PT Long Term Goals - 01/01/18 1359      PT LONG TERM GOAL #1   Title  Patient will be independent with HEP focused on decreasing fall risk to continue benefits of therapy after discharge.     Baseline  Dependent with exercise performance; 01/01/2018: moderate cueing for  performance.    Time  6    Period  Weeks    Status  On-going      PT LONG TERM GOAL #2   Title  Patient will improve TUG to under 12 sec to indicate singificant decrease in fall risk.     Baseline  20sec; 01/01/2018: 14sec    Time  8    Period  Weeks    Status  On-going      PT LONG TERM GOAL #3   Title  Patient will improve her ability to perform to 48m/s to indicate increased safety with walking at community ambulation    Baseline  .74m/s; 01/01/2018: .22m/s    Time  8    Period  Weeks    Status  On-going      PT LONG TERM GOAL #4   Title  Patient will improve 5XSTS to under 16 sec to indicate significant improvement LE functional strength and decrease fall risk.    Baseline  30sec; 01/01/2018: 12 sec    Time  8    Period  Weeks    Status  Achieved            Plan - 01/29/18 1417     Clinical Impression Statement  Patient demonstrates increased fatigue with exercises today requiring greater amount of sitting rest breaks compared to previous treatments. Patient continues to demonstrate decreased confidence with balancing exercises, but patient is able to perform exercises exercise with light held haeld assit from PT compared to previous sessions. Patient will benefit from further skilled therapy to return to prior level of function.     Rehab Potential  Fair    Clinical Impairments Affecting Rehab Potential  (+) family support, decreased coormorbities (-) age    PT Frequency  2x / week    PT Duration  8 weeks    PT Treatment/Interventions  Balance training;Neuromuscular re-education;Gait training;Patient/family education;Therapeutic exercise;Therapeutic activities;Functional mobility training;Stair training;Moist Heat;Aquatic Therapy;Electrical Stimulation;Cryotherapy;Manual techniques    PT Next Visit Plan  Progress strengenthening    PT Home Exercise Plan  See education    Consulted and Agree with Plan of Care  Patient;Family member/caregiver    Family Member Consulted  daughter       Patient will benefit from skilled therapeutic intervention in order to improve the following deficits and impairments:  Abnormal gait, Decreased coordination, Decreased endurance, Decreased activity tolerance, Decreased strength, Decreased range of motion, Decreased balance, Difficulty walking  Visit Diagnosis: Muscle weakness (generalized)  Difficulty in walking, not elsewhere classified     Problem List Patient Active Problem List   Diagnosis Date Noted  . Dyspnea on exertion 06/27/2017  . Atrial fibrillation (HCC) 04/26/2017  . Anemia 04/26/2017  . 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 Macular degeneration of left eye 01/06/2015  . Essential hypertension 12/25/2013  . BACK PAIN 08/02/2010    Myrene Galas, PT DPT 01/29/2018,  2:29 PM   [facility] PHYSICAL AND SPORTS MEDICINE [address] [place], [address] Phone: [phone]   Fax:  [phone]  Name: Paula Luna MRN: [id number] Date of Birth: 05/30/1925

## 2018-01-31 ENCOUNTER — Ambulatory Visit: Payer: PPO

## 2018-01-31 DIAGNOSIS — R262 Difficulty in walking, not elsewhere classified: Secondary | ICD-10-CM | POA: Diagnosis not present

## 2018-01-31 DIAGNOSIS — M6281 Muscle weakness (generalized): Secondary | ICD-10-CM

## 2018-01-31 NOTE — Therapy (Signed)
[facility] PH[facility]TS MEDICINE [address] [place], [address] Phone: [phone]   Fax:  [phone]  Physical Therapy Treatment  Patient Details  Name: Paula Luna MRN: [id number] Date of Birth: 1925/07/12 Referring Provider: Karleen Hampshire copland MD   Encounter Date: 01/31/2018  PT End of Session - 01/31/18 1419    Visit Number  18    Number of Visits  25    Date for PT Re-Evaluation  02/12/18    Authorization Time Period  7/ 10 Progress note    PT Start Time  1345    PT Stop Time  1430    PT Time Calculation (min)  45 min    Equipment Utilized During Treatment  Gait belt    Activity Tolerance  Patient tolerated treatment well;Patient limited by fatigue    Behavior During Therapy  [facility] for tasks assessed/performed       Past Medical History:  Diagnosis Date  . Anemia   . Back pain   . Hypertension 12/25/2013  . Ischemic bowel disease (HCC)    a. 03/2017 abd pain-->internal herina and ischemic jejunum s/p ex lap and small bowel resection.  . Macular degeneration of left eye 01/06/2015  . Osteoporosis   . Persistent atrial fibrillation (HCC)    a. First noted 03/2017;  b. 07/2017 Echo: EF 60-65%, no rwma, mild MR, mildly to mod dil LA; c. 07/2017 48hr Holter: persistent Afib, avg HR of 66 (range 39-140 bpm);  d. CHA2DS2VASc = 4-->Xarelto initiated 07/2017.    Past Surgical History:  Procedure Laterality Date  . CLOSED REDUCTION PROXIMAL HUMERUS FRACTURE Left 2017  . LAPAROSCOPIC SMALL BOWEL RESECTION    . LAPAROTOMY N/A 03/18/2017   Procedure: EXPLORATORY LAPAROTOMY, SMALL BOWEL RESECTION;  Surgeon: Leafy Ro, MD;  Location: [facility] OR[facility];  Service: General;  Laterality: N/A;  . TOTAL ABDOMINAL HYSTERECTOMY      There were no vitals filed for this visit.  Subjective Assessment - 01/31/18 1411    Subjective  Patient reports she feeling a little fatigued today. Patient states she'd like to get better with walking.      Pertinent History  Small bowel surgery     Limitations  Standing;Walking    Patient Stated Goals  To walk without feeling of falling     Currently in Pain?  No/denies          TREATMENT:   Therapeutic Exercise: Nustep seat level 4 at seat level 5 resistance -- x7 min  Ambulation around the gym - x236ft Step ups onto 6" step - 2 x 10 performed B  Hip abduction in standing - 2 x 10 with UE Standing balance on airex pad - x 30 sec  Heel raises on airex pad - x 15   Patient demonstrates increased fatigue at the end of the session.   PT Education - 01/31/18 1416    Education provided  Yes    Education Details  form/technique with exercise    Person(s) Educated  Patient    Methods  Explanation;Demonstration    Comprehension  Verbalized understanding;Returned demonstration          PT Long Term Goals - 01/01/18 1359      PT LONG TERM GOAL #1   Title  Patient will be independent with HEP focused on decreasing fall risk to continue benefits of therapy after discharge.     Baseline  Dependent with exercise performance; 01/01/2018: moderate cueing for performance.  Time  6    Period  Weeks    Status  On-going      PT LONG TERM GOAL #2   Title  Patient will improve TUG to under 12 sec to indicate singificant decrease in fall risk.     Baseline  20sec; 01/01/2018: 14sec    Time  8    Period  Weeks    Status  On-going      PT LONG TERM GOAL #3   Title  Patient will improve her ability to perform to 11m/s to indicate increased safety with walking at community ambulation    Baseline  .55m/s; 01/01/2018: .63m/s    Time  8    Period  Weeks    Status  On-going      PT LONG TERM GOAL #4   Title  Patient will improve 5XSTS to under 16 sec to indicate significant improvement LE functional strength and decrease fall risk.    Baseline  30sec; 01/01/2018: 12 sec    Time  8    Period  Weeks    Status  Achieved            Plan - 01/31/18 1424    Clinical Impression  Statement  Patient demonstrates greater ability to perform standing exercises; with ability to tolerate greater density of exercises today versus previous sessions. Patient also demosntrates ability to ambulate 248ft x 2 versus previous session in which she was able to tolerate 130ft to 170ft.  Patient demonstrates increased fatigue with exercise but continues to improve. Patient will benefit from further skilled therapy to return to prior level of function.     Rehab Potential  Fair    Clinical Impairments Affecting Rehab Potential  (+) family support, decreased coormorbities (-) age    PT Frequency  2x / week    PT Duration  8 weeks    PT Treatment/Interventions  Balance training;Neuromuscular re-education;Gait training;Patient/family education;Therapeutic exercise;Therapeutic activities;Functional mobility training;Stair training;Moist Heat;Aquatic Therapy;Electrical Stimulation;Cryotherapy;Manual techniques    PT Next Visit Plan  Progress strengenthening    PT Home Exercise Plan  See education    Consulted and Agree with Plan of Care  Patient;Family member/caregiver    Family Member Consulted  daughter       Patient will benefit from skilled therapeutic intervention in order to improve the following deficits and impairments:  Abnormal gait, Decreased coordination, Decreased endurance, Decreased activity tolerance, Decreased strength, Decreased range of motion, Decreased balance, Difficulty walking  Visit Diagnosis: Difficulty in walking, not elsewhere classified  Muscle weakness (generalized)     Problem List Patient Active Problem List   Diagnosis Date Noted  . Dyspnea on exertion 06/27/2017  . Atrial fibrillation (HCC) 04/26/2017  . Anemia 04/26/2017  . 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 Macular degeneration of left eye 01/06/2015  . Essential hypertension 12/25/2013  . BACK PAIN 08/02/2010    Myrene Galas, PT  DPT 01/31/2018, 2:30 PM   [facility] PHYSICAL AND SPORTS MEDICINE [address], [place], [address] Phone: [phone]   Fax:  [phone]  Name: Paula Luna MRN: [id number] Date of Birth: 1925-09-09

## 2018-02-06 ENCOUNTER — Ambulatory Visit: Payer: PPO

## 2018-02-06 DIAGNOSIS — R262 Difficulty in walking, not elsewhere classified: Secondary | ICD-10-CM

## 2018-02-06 DIAGNOSIS — M6281 Muscle weakness (generalized): Secondary | ICD-10-CM

## 2018-02-06 NOTE — Therapy (Signed)
[facility] PHYS[facility] SP[facility]TS MEDICINE [address] [place], [address] Phone: [phone]   Fax:  [phone]  Physical Therapy Treatment  Patient Details  Name: Paula Luna MRN: [id number] Date of Birth: [birth year] Referring Provider: Karleen Hampshire copland MD   Encounter Date: 02/06/2018  PT End of Session - 02/06/18 1127    Visit Number  19    Number of Visits  25    Date for PT Re-Evaluation  02/12/18    Authorization Time Period  8/ 10 Progress note    PT Start Time  1115    PT Stop Time  1200    PT Time Calculation (min)  45 min    Equipment Utilized During Treatment  Gait belt    Activity Tolerance  Patient tolerated treatment well;Patient limited by fatigue    Behavior During Therapy  [facility] for tasks assessed/performed       Past Medical History:  Diagnosis Date  . Anemia   . Back pain   . Hypertension 12/25/2013  . Ischemic bowel disease (HCC)    a. 03/2017 abd pain-->internal herina and ischemic jejunum s/p ex lap and small bowel resection.  . Macular degeneration of left eye 01/06/2015  . Osteoporosis   . Persistent atrial fibrillation (HCC)    a. First noted 03/2017;  b. 07/2017 Echo: EF 60-65%, no rwma, mild MR, mildly to mod dil LA; c. 07/2017 48hr Holter: persistent Afib, avg HR of 66 (range 39-140 bpm);  d. CHA2DS2VASc = 4-->Xarelto initiated 07/2017.    Past Surgical History:  Procedure Laterality Date  . CLOSED REDUCTION PROXIMAL HUMERUS FRACTURE Left 2017  . LAPAROSCOPIC SMALL BOWEL RESECTION    . LAPAROTOMY N/A 03/18/2017   Procedure: EXPLORATORY LAPAROTOMY, SMALL BOWEL RESECTION;  Surgeon: Leafy Ro, MD;  Location: [facility] ORS;  Service: General;  Laterality: N/A;  . TOTAL ABDOMINAL HYSTERECTOMY      There were no vitals filed for this visit.  Subjective Assessment - 02/06/18 1122    Subjective  Patient reports no major chances since the previous session. Patient states she has been active this weekend.      Pertinent History  Small bowel surgery     Limitations  Standing;Walking    Patient Stated Goals  To walk without feeling of falling     Currently in Pain?  No/denies       TREATMENT:   Therapeutic Exercise: Nustep seat level 6; level 5 resistance - x8 min  Ambulation around the gym - 2x270ft Side stepping up and over airex pad - x 10 B with UE support Hip abduction in standing - 3 x 10 with UE Single leg stance with less UE support possible - 2 x 30sec B    Patient demonstrates increased fatigue at the end of the session.   PT Education - 02/06/18 1126    Education provided  Yes    Education Details  form/technique with exercise    Person(s) Educated  Patient    Methods  Explanation;Demonstration    Comprehension  Verbalized understanding;Returned demonstration          PT Long Term Goals - 01/01/18 1359      PT LONG TERM GOAL #1   Title  Patient will be independent with HEP focused on decreasing fall risk to continue benefits of therapy after discharge.     Baseline  Dependent with exercise performance; 01/01/2018: moderate cueing for performance.    Time  6  Period  Weeks    Status  On-going      PT LONG TERM GOAL #2   Title  Patient will improve TUG to under 12 sec to indicate singificant decrease in fall risk.     Baseline  20sec; 01/01/2018: 14sec    Time  8    Period  Weeks    Status  On-going      PT LONG TERM GOAL #3   Title  Patient will improve her ability to perform to 61m/s to indicate increased safety with walking at community ambulation    Baseline  .27m/s; 01/01/2018: .22m/s    Time  8    Period  Weeks    Status  On-going      PT LONG TERM GOAL #4   Title  Patient will improve 5XSTS to under 16 sec to indicate significant improvement LE functional strength and decrease fall risk.    Baseline  30sec; 01/01/2018: 12 sec    Time  8    Period  Weeks    Status  Achieved            Plan - 02/06/18 1200    Clinical Impression  Statement  Patient demonstrates continuous impovement with functional exercises with improvement with performing hip abduction and ambulation. Although patient is improving, she continues to require increased rest breaks with exercises. Patient will benefit from further skilled therapy to return to prior level of function.     Rehab Potential  Fair    Clinical Impairments Affecting Rehab Potential  (+) family support, decreased coormorbities (-) age    PT Frequency  2x / week    PT Duration  8 weeks    PT Treatment/Interventions  Balance training;Neuromuscular re-education;Gait training;Patient/family education;Therapeutic exercise;Therapeutic activities;Functional mobility training;Stair training;Moist Heat;Aquatic Therapy;Electrical Stimulation;Cryotherapy;Manual techniques    PT Next Visit Plan  Progress strengenthening    PT Home Exercise Plan  See education    Consulted and Agree with Plan of Care  Patient;Family member/caregiver    Family Member Consulted  daughter       Patient will benefit from skilled therapeutic intervention in order to improve the following deficits and impairments:  Abnormal gait, Decreased coordination, Decreased endurance, Decreased activity tolerance, Decreased strength, Decreased range of motion, Decreased balance, Difficulty walking  Visit Diagnosis: Muscle weakness (generalized)  Difficulty in walking, not elsewhere classified     Problem List Patient Active Problem List   Diagnosis Date Noted  . Dyspnea on exertion 06/27/2017  . Atrial fibrillation (HCC) 04/26/2017  . Anemia 04/26/2017  . 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 Macular degeneration of left eye 01/06/2015  . Essential hypertension 12/25/2013  . BACK PAIN 08/02/2010    Myrene Galas, PT DPT 02/06/2018, 12:14 PM  [facility] PHYSICAL AND SPORTS MEDICINE [address] [place],  [address] Phone: [phone]   Fax:  [phone]  Name: Paula Luna MRN: [id number] Date of Birth: 07-08-1925

## 2018-02-08 ENCOUNTER — Ambulatory Visit: Payer: PPO

## 2018-02-08 DIAGNOSIS — R262 Difficulty in walking, not elsewhere classified: Secondary | ICD-10-CM | POA: Diagnosis not present

## 2018-02-08 DIAGNOSIS — M6281 Muscle weakness (generalized): Secondary | ICD-10-CM

## 2018-02-08 NOTE — Therapy (Signed)
[facility] PHYSICAL AND SPORTS MEDICINE [address] [place], [address] Phone: [phone]   Fax:  [phone]  Physical Therapy Treatment  Patient Details  Name: Paula Luna MRN: [id number] Date of Birth: 03-20-1925 Referring Provider: Karleen Hampshire copland MD   Encounter Date: 02/08/2018  PT End of Session - 02/08/18 1437    Visit Number  20    Number of Visits  25    Date for PT Re-Evaluation  02/12/18    Authorization Time Period  9/ 10 Progress note    PT Start Time  1430    PT Stop Time  1515    PT Time Calculation (min)  45 min    Equipment Utilized During Treatment  Gait belt    Activity Tolerance  Patient tolerated treatment well;Patient limited by fatigue    Behavior During Therapy  [facility] for tasks assessed/performed       Past Medical History:  Diagnosis Date  . Anemia   . Back pain   . Hypertension 12/25/2013  . Ischemic bowel disease (HCC)    a. 03/2017 abd pain-->internal herina and ischemic jejunum s/p ex lap and small bowel resection.  . Macular degeneration of left eye 01/06/2015  . Osteoporosis   . Persistent atrial fibrillation (HCC)    a. First noted 03/2017;  b. 07/2017 Echo: EF 60-65%, no rwma, mild MR, mildly to mod dil LA; c. 07/2017 48hr Holter: persistent Afib, avg HR of 66 (range 39-140 bpm);  d. CHA2DS2VASc = 4-->Xarelto initiated 07/2017.    Past Surgical History:  Procedure Laterality Date  . CLOSED REDUCTION PROXIMAL HUMERUS FRACTURE Left 2017  . LAPAROSCOPIC SMALL BOWEL RESECTION    . LAPAROTOMY N/A 03/18/2017   Procedure: EXPLORATORY LAPAROTOMY, SMALL BOWEL RESECTION;  Surgeon: Leafy Ro, MD;  Location: [facility] ORS;  Service: General;  Laterality: N/A;  . TOTAL ABDOMINAL HYSTERECTOMY      There were no vitals filed for this visit.  Subjective Assessment - 02/08/18 1432    Subjective  Patient reports she wishes there wasnt as hot and states the heat tires her out.     Pertinent History  Small  bowel surgery     Limitations  Standing;Walking    Patient Stated Goals  To walk without feeling of falling     Currently in Pain?  No/denies       TREATMENT:   Therapeutic Exercise: Nustep seat level 6; level 5 resistance - x8 min  Ambulation around the gym - 2x157ft Rotational turns on airex pad - x 9 with unilateral UE support Side stepping against single black tubing - x 5 B with UE support Sit to stands without UE support - x 20     Patient demonstrates increased fatigue at the end of the session.     PT Education - 02/08/18 1433    Education provided  Yes    Education Details  form/technique with exercise    Person(s) Educated  Patient    Methods  Explanation;Demonstration    Comprehension  Verbalized understanding;Returned demonstration          PT Long Term Goals - 01/01/18 1359      PT LONG TERM GOAL #1   Title  Patient will be independent with HEP focused on decreasing fall risk to continue benefits of therapy after discharge.     Baseline  Dependent with exercise performance; 01/01/2018: moderate cueing for performance.    Time  6    Period  Weeks    Status  On-going      PT LONG TERM GOAL #2   Title  Patient will improve TUG to under 12 sec to indicate singificant decrease in fall risk.     Baseline  20sec; 01/01/2018: 14sec    Time  8    Period  Weeks    Status  On-going      PT LONG TERM GOAL #3   Title  Patient will improve her ability to perform to 30m/s to indicate increased safety with walking at community ambulation    Baseline  .35m/s; 01/01/2018: .4m/s    Time  8    Period  Weeks    Status  On-going      PT LONG TERM GOAL #4   Title  Patient will improve 5XSTS to under 16 sec to indicate significant improvement LE functional strength and decrease fall risk.    Baseline  30sec; 01/01/2018: 12 sec    Time  8    Period  Weeks    Status  Achieved            Plan - 02/08/18 1458    Clinical Impression Statement  Patient reports  increased fatigue today and requires greater resting breaks in between exercises indicating poor musuclar endurance and decreased strength. Patient demonstrates improvement with standing exercise, with ability to perform turning exercises with less UE support. Patient will benefit from further skilled therapy to return to prior level of function.     Rehab Potential  Fair    Clinical Impairments Affecting Rehab Potential  (+) family support, decreased coormorbities (-) age    PT Frequency  2x / week    PT Duration  8 weeks    PT Treatment/Interventions  Balance training;Neuromuscular re-education;Gait training;Patient/family education;Therapeutic exercise;Therapeutic activities;Functional mobility training;Stair training;Moist Heat;Aquatic Therapy;Electrical Stimulation;Cryotherapy;Manual techniques    PT Next Visit Plan  Progress strengenthening    PT Home Exercise Plan  See education    Consulted and Agree with Plan of Care  Patient;Family member/caregiver    Family Member Consulted  daughter       Patient will benefit from skilled therapeutic intervention in order to improve the following deficits and impairments:  Abnormal gait, Decreased coordination, Decreased endurance, Decreased activity tolerance, Decreased strength, Decreased range of motion, Decreased balance, Difficulty walking  Visit Diagnosis: Difficulty in walking, not elsewhere classified  Muscle weakness (generalized)     Problem List Patient Active Problem List   Diagnosis Date Noted  . Dyspnea on exertion 06/27/2017  . Atrial fibrillation (HCC) 04/26/2017  . Anemia 04/26/2017  . 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 Macular degeneration of left eye 01/06/2015  . Essential hypertension 12/25/2013  . BACK PAIN 08/02/2010    Myrene Galas, PT DPT 02/08/2018, 3:09 PM  [facility] PHYSICAL AND SPORTS MEDICINE [address] [place], [address] Phone: [phone]   Fax:  [phone]  Name: Paula Luna MRN: [id number] Date of Birth: [birth year]

## 2018-02-12 ENCOUNTER — Ambulatory Visit: Payer: PPO | Attending: Family Medicine

## 2018-02-12 DIAGNOSIS — R262 Difficulty in walking, not elsewhere classified: Secondary | ICD-10-CM | POA: Diagnosis not present

## 2018-02-12 DIAGNOSIS — M6281 Muscle weakness (generalized): Secondary | ICD-10-CM | POA: Insufficient documentation

## 2018-02-12 NOTE — Therapy (Signed)
[facility] PHYS[facility]ORTS MEDICINE [address] [place], [place], [address] Phone: [phone]   Fax:  [phone]  Physical Therapy Treatment  Patient Details  Name: Paula Luna MRN: [id number] Date of Birth: [birth year] Referring Provider: Karleen Hampshirespencer copland MD   Encounter Date: 02/12/2018  PT End of Session - 02/12/18 1123    Visit Number  21    Number of Visits  25    Date for PT Re-Evaluation  02/12/18    Authorization Time Period  10/ 10 Progress note    PT Start Time  1045    PT Stop Time  1130    PT Time Calculation (min)  45 min    Equipment Utilized During Treatment  Gait belt    Activity Tolerance  Patient tolerated treatment well;Patient limited by fatigue    Behavior During Therapy  [facility] for tasks assessed/performed       Past Medical History:  Diagnosis Date  . Anemia   . Back pain   . Hypertension 12/25/2013  . Ischemic bowel disease (HCC)    a. 03/2017 abd pain-->internal herina and ischemic jejunum s/p ex lap and small bowel resection.  . Macular degeneration of left eye 01/06/2015  . Osteoporosis   . Persistent atrial fibrillation (HCC)    a. First noted 03/2017;  b. 07/2017 Echo: EF 60-65%, no rwma, mild MR, mildly to mod dil LA; c. 07/2017 48hr Holter: persistent Afib, avg HR of 66 (range 39-140 bpm);  d. CHA2DS2VASc = 4-->Xarelto initiated 07/2017.    Past Surgical History:  Procedure Laterality Date  . CLOSED REDUCTION PROXIMAL HUMERUS FRACTURE Left 2017  . LAPAROSCOPIC SMALL BOWEL RESECTION    . LAPAROTOMY N/A 03/18/2017   Procedure: EXPLORATORY LAPAROTOMY, SMALL BOWEL RESECTION;  Surgeon: Leafy RoPabon, Diego F, MD;  Location: [facility] ORS;  Service: General;  Laterality: N/A;  . TOTAL ABDOMINAL HYSTERECTOMY      There were no vitals filed for this visit.  Subjective Assessment - 02/12/18 1103    Subjective  Patient daughter reports she has been doing well with transferring in and out of the car.     Pertinent History   Small bowel surgery     Limitations  Standing;Walking    Patient Stated Goals  To walk without feeling of falling     Currently in Pain?  No/denies       TREATMENT Therapeutic Exercise: TUG in standing -- x6 3320ft with turning Side stepping across with UE support -- x5733ft B Backwards stepping without UE support -- 6350ft Walking around 2650ft with focus on improving stepping  Walking forward for speed -- 4 x 833ft Nustep focus on speed -- x 4min resistance 5  Patient demonstrates increased fatigue at end of the session      PT Education - 02/12/18 1126    Education provided  Yes    Education Details  form/technique with exercise    Person(s) Educated  Patient    Methods  Explanation;Demonstration    Comprehension  Verbalized understanding;Returned demonstration          PT Long Term Goals - 02/12/18 1112      PT LONG TERM GOAL #1   Title  Patient will be independent with HEP focused on decreasing fall risk to continue benefits of therapy after discharge.     Baseline  Dependent with exercise performance; 01/01/2018: moderate cueing for performance.    Time  6    Period  Weeks    Status  On-going      PT LONG TERM GOAL #2   Title  Patient will improve TUG to under 12 sec to indicate singificant decrease in fall risk.     Baseline  20sec; 01/01/2018: 14sec; 02/12/2018; 11.59 sec    Time  8    Period  Weeks    Status  Achieved      PT LONG TERM GOAL #3   Title  Patient will improve her ability to perform to 16m/s to indicate increased safety with walking at community ambulation    Baseline  .63m/s; 01/01/2018: .19m/s; 02/12/2018: .53m/s    Time  8    Period  Weeks    Status  On-going      PT LONG TERM GOAL #4   Title  Patient will improve 5XSTS to under 16 sec to indicate significant improvement LE functional strength and decrease fall risk.    Baseline  30sec; 01/01/2018: 12 sec    Time  8    Period  Weeks    Status  Achieved            Plan - 02/12/18  1142    Clinical Impression Statement  Patient reports improvement towards long term goals with improvement for TUG and 10WT with exercises. Patient demonstrates improvement with dynamic balance but continues to require UE support for standing balance. Patient will benefit from further skilled therapy to return to prior level of function.     Rehab Potential  Fair    Clinical Impairments Affecting Rehab Potential  (+) family support, decreased coormorbities (-) age    PT Frequency  2x / week    PT Duration  8 weeks    PT Treatment/Interventions  Balance training;Neuromuscular re-education;Gait training;Patient/family education;Therapeutic exercise;Therapeutic activities;Functional mobility training;Stair training;Moist Heat;Aquatic Therapy;Electrical Stimulation;Cryotherapy;Manual techniques    PT Next Visit Plan  Progress strengenthening    PT Home Exercise Plan  See education    Consulted and Agree with Plan of Care  Patient;Family member/caregiver    Family Member Consulted  daughter       Patient will benefit from skilled therapeutic intervention in order to improve the following deficits and impairments:  Abnormal gait, Decreased coordination, Decreased endurance, Decreased activity tolerance, Decreased strength, Decreased range of motion, Decreased balance, Difficulty walking  Visit Diagnosis: Difficulty in walking, not elsewhere classified  Muscle weakness (generalized)     Problem List Patient Active Problem List   Diagnosis Date Noted  . Dyspnea on exertion 06/27/2017  . Atrial fibrillation (HCC) 04/26/2017  . Anemia 04/26/2017  . 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 Macular degeneration of left eye 01/06/2015  . Essential hypertension 12/25/2013  . BACK PAIN 08/02/2010   Myrene Galas, PT DPT 02/12/2018, 11:48 AM  [place] PHYSICAL AND SPORTS MEDICINE [address]  [place], [address] Phone: [phone]   Fax:  [phone]  Name: Paula Luna MRN: [id number] Date of Birth: [birth year]

## 2018-02-13 ENCOUNTER — Ambulatory Visit (INDEPENDENT_AMBULATORY_CARE_PROVIDER_SITE_OTHER): Payer: PPO

## 2018-02-13 VITALS — BP 132/82 | HR 59 | Temp 97.5°F | Ht 59.0 in | Wt 132.5 lb

## 2018-02-13 DIAGNOSIS — E559 Vitamin D deficiency, unspecified: Secondary | ICD-10-CM

## 2018-02-13 DIAGNOSIS — Z0000 Encounter for general adult medical examination without abnormal findings: Secondary | ICD-10-CM | POA: Diagnosis not present

## 2018-02-13 DIAGNOSIS — I10 Essential (primary) hypertension: Secondary | ICD-10-CM | POA: Diagnosis not present

## 2018-02-13 LAB — CBC WITH DIFFERENTIAL/PLATELET
BASOS PCT: 0.6 % (ref 0.0–3.0)
Basophils Absolute: 0 10*3/uL (ref 0.0–0.1)
Eosinophils Absolute: 0.1 10*3/uL (ref 0.0–0.7)
Eosinophils Relative: 1.6 % (ref 0.0–5.0)
HEMATOCRIT: 41.9 % (ref 36.0–46.0)
HEMOGLOBIN: 13.5 g/dL (ref 12.0–15.0)
LYMPHS PCT: 35.2 % (ref 12.0–46.0)
Lymphs Abs: 2.3 10*3/uL (ref 0.7–4.0)
MCHC: 32.3 g/dL (ref 30.0–36.0)
MCV: 79.6 fl (ref 78.0–100.0)
Monocytes Absolute: 0.6 10*3/uL (ref 0.1–1.0)
Monocytes Relative: 8.5 % (ref 3.0–12.0)
Neutro Abs: 3.5 10*3/uL (ref 1.4–7.7)
Neutrophils Relative %: 54.1 % (ref 43.0–77.0)
Platelets: 212 10*3/uL (ref 150.0–400.0)
RBC: 5.27 Mil/uL — AB (ref 3.87–5.11)
RDW: 16.4 % — AB (ref 11.5–15.5)
WBC: 6.5 10*3/uL (ref 4.0–10.5)

## 2018-02-13 LAB — BASIC METABOLIC PANEL
BUN: 16 mg/dL (ref 6–23)
CALCIUM: 9.7 mg/dL (ref 8.4–10.5)
CO2: 29 mEq/L (ref 19–32)
CREATININE: 0.87 mg/dL (ref 0.40–1.20)
Chloride: 107 mEq/L (ref 96–112)
GFR: 64.82 mL/min (ref >60.00)
GLUCOSE: 100 mg/dL — AB (ref 70–99)
Potassium: 4.7 mEq/L (ref 3.5–5.1)
SODIUM: 143 meq/L (ref 135–145)

## 2018-02-13 LAB — VITAMIN D 25 HYDROXY (VIT D DEFICIENCY, FRACTURES): VITD: 19 ng/mL — ABNORMAL LOW (ref 30.00–100.00)

## 2018-02-13 LAB — HEPATIC FUNCTION PANEL
ALBUMIN: 4.1 g/dL (ref 3.5–5.2)
ALT: 9 U/L (ref 0–35)
AST: 17 U/L (ref 0–37)
Alkaline Phosphatase: 91 U/L (ref 39–117)
Bilirubin, Direct: 0.1 mg/dL (ref 0.0–0.3)
TOTAL PROTEIN: 7 g/dL (ref 6.0–8.3)
Total Bilirubin: 0.5 mg/dL (ref 0.2–1.2)

## 2018-02-13 LAB — LIPID PANEL
CHOL/HDL RATIO: 3
Cholesterol: 169 mg/dL (ref 0–200)
HDL: 60.8 mg/dL (ref >39.00)
LDL CALC: 94 mg/dL (ref 0–99)
NonHDL: 108.19
Triglycerides: 73 mg/dL (ref 0.0–149.0)
VLDL: 14.6 mg/dL (ref 0.0–40.0)

## 2018-02-13 LAB — TSH: TSH: 1.29 u[IU]/mL (ref 0.35–4.50)

## 2018-02-13 NOTE — Progress Notes (Signed)
PCP notes:   Health maintenance:  No gaps identified.  Abnormal screenings:   Hearing - failed  Hearing Screening   125Hz  250Hz  500Hz  1000Hz  2000Hz  3000Hz  4000Hz  6000Hz  8000Hz   Right ear:   40 40 40  40    Left ear:   40 40 40  0     Patient concerns:   None  Nurse concerns:  None  Next PCP appt:   02/19/18 @ 0820

## 2018-02-13 NOTE — Progress Notes (Signed)
Subjective:   Paula Luna is a 84 y.o. female who presents for Medicare Annual (Subsequent) preventive examination.  Review of Systems:  N/A Cardiac Risk Factors include: advanced age (>49men, >33 women);hypertension     Objective:     Vitals: BP 132/82 (BP Location: Left Arm, Patient Position: Sitting, Cuff Size: Normal)   Pulse (!) 59   Temp (!) 97.5 F (36.4 C) (Oral)   Ht 4\' 11"  (1.499 m) Comment: shoes  Wt 132 lb 8 oz (60.1 kg)   SpO2 94%   BMI 26.76 kg/m   Body mass index is 26.76 kg/m.  Advanced Directives 02/13/2018 11/23/2017 03/27/2017 03/18/2017 03/18/2017 01/23/2017  Does Patient Have a Medical Advance Directive? Yes No Yes No No Yes  Type of Estate agent of [place];Living will - Healthcare Power of [place];Living will - - Healthcare Power of [place];Living will  Does patient want to make changes to medical advance directive? - - No - Patient declined - - -  Copy of Healthcare Power of Attorney in Chart? No - copy requested - No - copy requested - - No - copy requested  Would patient like information on creating a medical advance directive? - - No - Patient declined No - Patient declined No - Patient declined -    Tobacco Social History   Tobacco Use  Smoking Status Former Smoker  Smokeless Tobacco Never Used     Counseling given: No   Clinical Intake:  Pre-visit preparation completed: Yes  Pain : No/denies pain Pain Score: 0-No pain     Nutritional Status: BMI 25 -29 Overweight Nutritional Risks: None Diabetes: No  How often do you need to have someone help you when you read instructions, pamphlets, or other written materials from your doctor or pharmacy?: 2 - Rarely What is the last grade level you completed in school?: 12th grade  Interpreter Needed?: No  Comments: pt lives with daughter Information entered by :: LPinson, LPN  Past Medical History:  Diagnosis Date  . Anemia   . Back pain   . Hypertension  12/25/2013  . Ischemic bowel disease (HCC)    a. 03/2017 abd pain-->internal herina and ischemic jejunum s/p ex lap and small bowel resection.  . Macular degeneration of left eye 01/06/2015  . Osteoporosis   . Persistent atrial fibrillation (HCC)    a. First noted 03/2017;  b. 07/2017 Echo: EF 60-65%, no rwma, mild MR, mildly to mod dil LA; c. 07/2017 48hr Holter: persistent Afib, avg HR of 66 (range 39-140 bpm);  d. CHA2DS2VASc = 4-->Xarelto initiated 07/2017.   Past Surgical History:  Procedure Laterality Date  . CLOSED REDUCTION PROXIMAL HUMERUS FRACTURE Left 2017  . LAPAROSCOPIC SMALL BOWEL RESECTION    . LAPAROTOMY N/A 03/18/2017   Procedure: EXPLORATORY LAPAROTOMY, SMALL BOWEL RESECTION;  Surgeon: Leafy Ro, MD;  Location: [facility] ORS;  Service: General;  Laterality: N/A;  . TOTAL ABDOMINAL HYSTERECTOMY     Family History  Problem Relation Age of Onset  . Diabetes Mother   . Cancer Father        jaw   Social History   Socioeconomic History  . Marital status: Married    Spouse name: Not on file  . Number of children: Not on file  . Years of education: Not on file  . Highest education level: Not on file  Occupational History  . Occupation: Musician: [email or web address]    Comment: 3 days a week  Social Needs  .  Financial resource strain: Not on file  . Food insecurity:    Worry: Not on file    Inability: Not on file  . Transportation needs:    Medical: Not on file    Non-medical: Not on file  Tobacco Use  . Smoking status: Former Games developermoker  . Smokeless tobacco: Never Used  Substance and Sexual Activity  . Alcohol use: No  . Drug use: No  . Sexual activity: Never  Lifestyle  . Physical activity:    Days per week: Not on file    Minutes per session: Not on file  . Stress: Not on file  Relationships  . Social connections:    Talks on phone: Not on file    Gets together: Not on file    Attends religious service: Not on file    Active member of club or organization:  Not on file    Attends meetings of clubs or organizations: Not on file    Relationship status: Not on file  Other Topics Concern  . Not on file  Social History Narrative   Widowed after 64 years   Working some at [employer]   Active in [employer]   Lives with daughter    Outpatient Encounter Medications as of 02/13/2018  Medication Sig  . docusate sodium (COLACE) 100 MG capsule Take 100 mg by mouth daily.   Marland Kitchen. lisinopril (PRINIVIL,ZESTRIL) 10 MG tablet TAKE 1 TABLET BY MOUTH ONCE DAILY  . Multiple Vitamins-Minerals (ICAPS) CAPS Take 1 capsule by mouth daily.  . rivaroxaban (XARELTO) 20 MG TABS tablet Take 1 tablet (20 mg total) by mouth daily with supper.   No facility-administered encounter medications on file as of 02/13/2018.     Activities of Daily Living In your present state of health, do you have any difficulty performing the following activities: 02/13/2018 03/21/2017  Hearing? N N  Vision? Y N  Difficulty concentrating or making decisions? N N  Walking or climbing stairs? Y N  Dressing or bathing? N N  Doing errands, shopping? Malvin JohnsY Y  Preparing Food and eating ? N -  Using the Toilet? N -  In the past six months, have you accidently leaked urine? Y -  Do you have problems with loss of bowel control? N -  Managing your Medications? N -  Managing your Finances? N -  Housekeeping or managing your Housekeeping? N -  Some recent data might be hidden    Patient Care Team: Hannah Beatopland, Spencer, MD as PCP - General    Assessment:   This is a routine wellness examination for Paula Luna.   Hearing Screening   125Hz  250Hz  500Hz  1000Hz  2000Hz  3000Hz  4000Hz  6000Hz  8000Hz   Right ear:   40 40 40  40    Left ear:   40 40 40  0    Vision Screening Comments: Vision exam every 6mths    Exercise Activities and Dietary recommendations Current Exercise Habits: Structured exercise class, Type of exercise: Other - see comments(rehab), Time (Minutes): 45, Frequency (Times/Week): 2, Weekly  Exercise (Minutes/Week): 90, Intensity: Mild, Exercise limited by: None identified  Goals    . Patient Stated     Starting 02/13/2018, I will continue to participate in rehab for 45 minutes twice weekly.        Fall Risk Fall Risk  02/13/2018 01/23/2017 01/20/2016 01/05/2015 12/25/2013  Falls in the past year? No Yes Yes No No  Comment - pt had accidental fall in home; soreness to right hip; no medical treatment - - -  Number falls in past yr: - 1 1 - -  Injury with Fall? - Yes Yes - -  Risk for fall due to : - - - - -  Risk for fall due to: Comment - - - - -     Depression Screen PHQ 2/9 Scores 02/13/2018 01/23/2017 01/20/2016 01/05/2015  PHQ - 2 Score 0 0 0 0  PHQ- 9 Score 0 - - -     Cognitive Function MMSE - Mini Mental State Exam 02/13/2018 01/23/2017  Orientation to time 5 5  Orientation to Place 5 5  Registration 3 3  Attention/ Calculation 0 0  Recall 3 3  Language- name 2 objects 0 0  Language- repeat 1 1  Language- follow 3 step command 3 3  Language- read & follow direction 0 0  Write a sentence 0 0  Copy design 0 0  Total score 20 20       Immunization History  Administered Date(s) Administered  . Influenza Split 07/12/2012, 06/28/2013  . Influenza Whole 08/20/2007, 05/10/2010  . Influenza, High Dose Seasonal PF 08/14/2014, 07/29/2015, 05/22/2016, 06/10/2017  . Pneumococcal Conjugate-13 12/25/2013  . Pneumococcal Polysaccharide-23 05/10/2010  . Zoster 10/15/2012    Screening Tests Health Maintenance  Topic Date Due  . INFLUENZA VACCINE  04/12/2018  . DEXA SCAN  Completed  . PNA vac Low Risk Adult  Completed      Plan:     I have personally reviewed, addressed, and noted the following in the patient's chart:  A. Medical and social history B. Use of alcohol, tobacco or illicit drugs  C. Current medications and supplements D. Functional ability and status E.  Nutritional status F.  Physical activity G. Advance directives H. List of other physicians I.    Hospitalizations, surgeries, and ER visits in previous 12 months J.  Vitals K. Screenings to include hearing, vision, cognitive, depression L. Referrals and appointments - none  In addition, I have reviewed and discussed with patient certain preventive protocols, quality metrics, and best practice recommendations. A written personalized care plan for preventive services as well as general preventive health recommendations were provided to patient.  See attached scanned questionnaire for additional information.   Signed,   Randa Evens, MHA, BS, LPN Health Coach

## 2018-02-13 NOTE — Patient Instructions (Addendum)
[name] , Thank you for taking time to come for your Medicare Wellness Visit. I appreciate your ongoing commitment to your health goals. Please review the following plan we discussed and let me know if I can assist you in the future.   These are the goals we discussed: Goals    . Patient Stated     Starting 02/13/2018, I will continue to participate in rehab for 45 minutes twice weekly.        This is a list of the screening recommended for you and due dates:  Health Maintenance  Topic Date Due  . Flu Shot  04/12/2018  . DEXA scan (bone density measurement)  Completed  . Pneumonia vaccines  Completed   Preventive Care for Adults  A healthy lifestyle and preventive care can promote health and wellness. Preventive health guidelines for adults include the following key practices.  . A routine yearly physical is a good way to check with your health care provider about your health and preventive screening. It is a chance to share any concerns and updates on your health and to receive a thorough exam.  . Visit your dentist for a routine exam and preventive care every 6 months. Brush your teeth twice a day and floss once a day. Good oral hygiene prevents tooth decay and gum disease.  . The frequency of eye exams is based on your age, health, family medical history, use  of contact lenses, and other factors. Follow your health care provider's recommendations for frequency of eye exams.  . Eat a healthy diet. Foods like vegetables, fruits, whole grains, low-fat dairy products, and lean protein foods contain the nutrients you need without too many calories. Decrease your intake of foods high in solid fats, added sugars, and salt. Eat the right amount of calories for you. Get information about a proper diet from your health care provider, if necessary.  . Regular physical exercise is one of the most important things you can do for your health. Most adults should get at least 150 minutes of  moderate-intensity exercise (any activity that increases your heart rate and causes you to sweat) each week. In addition, most adults need muscle-strengthening exercises on 2 or more days a week.  Silver Sneakers may be a benefit available to you. To determine eligibility, you may visit the website: www.silversneakers.com or contact program at 81411390821-(218)336-4158 Mon-Fri between 8AM-8PM.   . Maintain a healthy weight. The body mass index (BMI) is a screening tool to identify possible weight problems. It provides an estimate of body fat based on height and weight. Your health care provider can find your BMI and can help you achieve or maintain a healthy weight.   For adults 20 years and older: ? A BMI below 18.5 is considered underweight. ? A BMI of 18.5 to 24.9 is normal. ? A BMI of 25 to 29.9 is considered overweight. ? A BMI of 30 and above is considered obese.   . Maintain normal blood lipids and cholesterol levels by exercising and minimizing your intake of saturated fat. Eat a balanced diet with plenty of fruit and vegetables. Blood tests for lipids and cholesterol should begin at age 220 and be repeated every 5 years. If your lipid or cholesterol levels are high, you are over 50, or you are at high risk for heart disease, you may need your cholesterol levels checked more frequently. Ongoing high lipid and cholesterol levels should be treated with medicines if diet and exercise are not  working.  . If you smoke, find out from your health care provider how to quit. If you do not use tobacco, please do not start.  . If you choose to drink alcohol, please do not consume more than 2 drinks per day. One drink is considered to be 12 ounces (355 mL) of beer, 5 ounces (148 mL) of wine, or 1.5 ounces (44 mL) of liquor.  . If you are 68-63 years old, ask your health care provider if you should take aspirin to prevent strokes.  . Use sunscreen. Apply sunscreen liberally and repeatedly throughout the day. You  should seek shade when your shadow is shorter than you. Protect yourself by wearing long sleeves, pants, a wide-brimmed hat, and sunglasses year round, whenever you are outdoors.  . Once a month, do a whole body skin exam, using a mirror to look at the skin on your back. Tell your health care provider of new moles, moles that have irregular borders, moles that are larger than a pencil eraser, or moles that have changed in shape or color.

## 2018-02-13 NOTE — Progress Notes (Signed)
I reviewed health advisor's note, was available for consultation, and agree with documentation and plan.  

## 2018-02-14 ENCOUNTER — Ambulatory Visit: Payer: PPO

## 2018-02-14 DIAGNOSIS — M6281 Muscle weakness (generalized): Secondary | ICD-10-CM

## 2018-02-14 DIAGNOSIS — R262 Difficulty in walking, not elsewhere classified: Secondary | ICD-10-CM | POA: Diagnosis not present

## 2018-02-14 NOTE — Therapy (Signed)
[facility] PHYS[facility] AND SPORTS MEDICINE [address] [place], [place], [address] Phone: [phone]   Fax:  [phone]  Physical Therapy Treatment  Patient Details  Name: Paula Luna MRN: [id number] Date of Birth: [birth year] Referring Provider: Karleen Hampshirespencer copland MD   Encounter Date: 02/14/2018  PT End of Session - 02/14/18 1409    Visit Number  22    Number of Visits  34    Date for PT Re-Evaluation  03/13/18    Authorization Time Period  1/ 10 Progress note    PT Start Time  1300    PT Stop Time  1345    PT Time Calculation (min)  45 min    Equipment Utilized During Treatment  Gait belt    Activity Tolerance  Patient tolerated treatment well;Patient limited by fatigue    Behavior During Therapy  [facility] for tasks assessed/performed       Past Medical History:  Diagnosis Date  . Anemia   . Back pain   . Hypertension 12/25/2013  . Ischemic bowel disease (HCC)    a. 03/2017 abd pain-->internal herina and ischemic jejunum s/p ex lap and small bowel resection.  . Macular degeneration of left eye 01/06/2015  . Osteoporosis   . Persistent atrial fibrillation (HCC)    a. First noted 03/2017;  b. 07/2017 Echo: EF 60-65%, no rwma, mild MR, mildly to mod dil LA; c. 07/2017 48hr Holter: persistent Afib, avg HR of 66 (range 39-140 bpm);  d. CHA2DS2VASc = 4-->Xarelto initiated 07/2017.    Past Surgical History:  Procedure Laterality Date  . CLOSED REDUCTION PROXIMAL HUMERUS FRACTURE Left 2017  . LAPAROSCOPIC SMALL BOWEL RESECTION    . LAPAROTOMY N/A 03/18/2017   Procedure: EXPLORATORY LAPAROTOMY, SMALL BOWEL RESECTION;  Surgeon: Leafy RoPabon, Diego F, MD;  Location: [facility] ORS;  Service: General;  Laterality: N/A;  . TOTAL ABDOMINAL HYSTERECTOMY      There were no vitals filed for this visit.  Subjective Assessment - 02/14/18 1405    Subjective  Pt reports that she got a good review at wellness visit earlier in the day. Also pt questioned what she would be  doing after being discharged from PT.     Pertinent History  Small bowel surgery     Limitations  Standing;Walking    Patient Stated Goals  To walk without feeling of falling     Currently in Pain?  No/denies       TREATMENT Therapeutic Exercises Nu step level 5, 7 minutes to improve LE strengthening  Ambulation -- 2.5 laps (150 feet) (100 feet) to improve endurance  Calf raises (on airex) -- 20x2 Grapevine -- 10 m x2 with hand help support High marches on airex -- 20x2 with UE support  Step ups x20 B (3in step) with unilateral UE support  Patient demonstrates increased fatigue at end of the session     PT Education - 02/14/18 1407    Education provided  Yes    Education Details  Pt educated on proper technique for therapeutic exercises.     Person(s) Educated  Patient    Methods  Explanation;Demonstration    Comprehension  Returned demonstration;Verbalized understanding          PT Long Term Goals - 02/12/18 1112      PT LONG TERM GOAL #1   Title  Patient will be independent with HEP focused on decreasing fall risk to continue benefits of therapy after discharge.     Baseline  Dependent with exercise performance; 01/01/2018: moderate cueing for performance.    Time  6    Period  Weeks    Status  On-going      PT LONG TERM GOAL #2   Title  Patient will improve TUG to under 12 sec to indicate singificant decrease in fall risk.     Baseline  20sec; 01/01/2018: 14sec; 02/12/2018; 11.59 sec    Time  8    Period  Weeks    Status  Achieved      PT LONG TERM GOAL #3   Title  Patient will improve her ability to perform to 27m/s to indicate increased safety with walking at community ambulation    Baseline  .27m/s; 01/01/2018: .62m/s; 02/12/2018: .48m/s    Time  8    Period  Weeks    Status  On-going      PT LONG TERM GOAL #4   Title  Patient will improve 5XSTS to under 16 sec to indicate significant improvement LE functional strength and decrease fall risk.    Baseline   30sec; 01/01/2018: 12 sec    Time  8    Period  Weeks    Status  Achieved            Plan - 02/14/18 1556    Clinical Impression Statement  Pt demonstrates impaired balance with grapevine exercise. Rest break required after 1 lap of walking due to fatigue. Increased time to complete activities to due increased fatigue and rest breaks. Pt also demonstrates need for hand held support during all activities which indicates impaired balance. Verbal cues required to decrease UE support. Pt will benefit from continued skilled PT in order to improve function and return to PLOF.    Rehab Potential  Fair    Clinical Impairments Affecting Rehab Potential  (+) family support, decreased coormorbities (-) age    PT Frequency  2x / week    PT Duration  8 weeks    PT Treatment/Interventions  Balance training;Neuromuscular re-education;Gait training;Patient/family education;Therapeutic exercise;Therapeutic activities;Functional mobility training;Stair training;Moist Heat;Aquatic Therapy;Electrical Stimulation;Cryotherapy;Manual techniques    PT Next Visit Plan  Next visit, attempt more dynamic balance tasks, ambulation over obstacles, ambulation with head turns, and sit to stands.    PT Home Exercise Plan  See education    Consulted and Agree with Plan of Care  Patient;Family member/caregiver    Family Member Consulted  daughter       Patient will benefit from skilled therapeutic intervention in order to improve the following deficits and impairments:  Abnormal gait, Decreased coordination, Decreased endurance, Decreased activity tolerance, Decreased strength, Decreased range of motion, Decreased balance, Difficulty walking  Visit Diagnosis: Difficulty in walking, not elsewhere classified  Muscle weakness (generalized)     Problem List Patient Active Problem List   Diagnosis Date Noted  . Dyspnea on exertion 06/27/2017  . Atrial fibrillation (HCC) 04/26/2017  . Anemia 04/26/2017  . Ischemic  bowel disease (HCC) 03/18/2017  . 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 Macular degeneration of left eye 01/06/2015  . Essential hypertension 12/25/2013  . BACK PAIN 08/02/2010    Mellody Dance, SPT  02/14/2018, 4:01 PM  [facility] PHYSICAL AND SPORTS MEDICINE [address] [place], [address] Phone: [phone]   Fax:  [phone]  Name: Paula Luna MRN: [id number] Date of Birth: 12-03-1924

## 2018-02-18 NOTE — Progress Notes (Signed)
Dr. Frederico Hamman T. Kaulder Zahner, MD, [facility] [place], [address] Phone: [phone] Fax: [phone]  02/19/2018  Patient: Paula Luna, MRN: [id number], DOB: 06/01/1925, 84 y.o.  Primary Physician:  Owens Loffler, MD   Chief Complaint  Patient presents with  . Annual Exam    Part 2   Subjective:   Paula Luna is a 84 y.o. pleasant patient who presents with the following:  Health Maintenance Summary Reviewed and updated, unless pt declines services.  Tobacco History Reviewed. Non-smoker Alcohol: No concerns, no excessive use Exercise Habits: Some activity, rec at least 30 mins 5 times a week - working with PT STD concerns: none Drug Use: None Birth control method: n/a Menses regular: n/a Lumps or breast concerns: no Breast Cancer Family History: no  She does have a history of a small bowel resection after ischemic bowel in July of last year.  Afterwards she had some significant weakness and deconditioning.  She has been in physical therapy and has been making some improvements.  She also was found to have atrial fibrillation.  She currently is taking Xarelto and also sees Dr. Saunders Revel.  Health Maintenance  Topic Date Due  . INFLUENZA VACCINE  04/12/2018  . DEXA SCAN  Completed  . PNA vac Low Risk Adult  Completed    Immunization History  Administered Date(s) Administered  . Influenza Split 07/12/2012, 06/28/2013  . Influenza Whole 08/20/2007, 05/10/2010  . Influenza, High Dose Seasonal PF 08/14/2014, 07/29/2015, 05/22/2016, 06/10/2017  . Pneumococcal Conjugate-13 12/25/2013  . Pneumococcal Polysaccharide-23 05/10/2010  . Zoster 10/15/2012   Patient Active Problem List   Diagnosis Date Noted  . Atrial fibrillation ([place]) 04/26/2017  . Anemia 04/26/2017  . Ischemic bowel disease ([place]) 03/18/2017  . Ischemic necrosis of small bowel ([place])   . Macular degeneration of left eye 01/06/2015  . Essential  hypertension 12/25/2013  . BACK PAIN 08/02/2010   Past Medical History:  Diagnosis Date  . Anemia   . Back pain   . Hypertension 12/25/2013  . Ischemic bowel disease ([place])    a. 03/2017 abd pain-->internal herina and ischemic jejunum s/p ex lap and small bowel resection.  . Macular degeneration of left eye 01/06/2015  . Osteoporosis   . Persistent atrial fibrillation ([place])    a. First noted 03/2017;  b. 07/2017 Echo: EF 60-65%, no rwma, mild MR, mildly to mod dil LA; c. 07/2017 48hr Holter: persistent Afib, avg HR of 66 (range 39-140 bpm);  d. CHA2DS2VASc = 4-->Xarelto initiated 07/2017.   Past Surgical History:  Procedure Laterality Date  . CLOSED REDUCTION PROXIMAL HUMERUS FRACTURE Left 2017  . LAPAROSCOPIC SMALL BOWEL RESECTION    . LAPAROTOMY N/A 03/18/2017   Procedure: EXPLORATORY LAPAROTOMY, SMALL BOWEL RESECTION;  Surgeon: Jules Husbands, MD;  Location: [facility] ORS;  Service: General;  Laterality: N/A;  . TOTAL ABDOMINAL HYSTERECTOMY     Social History   Socioeconomic History  . Marital status: Married    Spouse name: Not on file  . Number of children: Not on file  . Years of education: Not on file  . Highest education level: Not on file  Occupational History  . 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 Financial resource strain: Not on file  . Food insecurity:    Worry: Not on file    Inability: Not on file  . Transportation needs:  Medical: Not on file    Non-medical: Not on file  Tobacco Use  . Smoking status: Former Research scientist (life sciences)  . Smokeless tobacco: Never Used  Substance and Sexual Activity  . Alcohol use: No  . Drug use: No  . Sexual activity: Never  Lifestyle  . Physical activity:    Days per week: Not on file    Minutes per session: Not on file  . Stress: Not on file  Relationships  . Social connections:    Talks on phone: Not on file    Gets together: Not on file    Attends religious service: Not on file     Active member of club or organization: Not on file    Attends meetings of clubs or organizations: Not on file    Relationship status: Not on file  . Intimate partner violence:    Fear of current or ex partner: Not on file    Emotionally abused: Not on file    Physically abused: Not on file    Forced sexual activity: Not on file  Other Topics Concern  . Not on file  Social History Narrative   Widowed after 42 years   Working some at [employer] in [employer] with daughter   Family History  Problem Relation Age of Onset  . Diabetes Mother   . Cancer Father        jaw   Allergies  Allergen Reactions  . Latex Rash  . Tetanus Toxoid Anaphylaxis    REACTION: anaphylaxis  . Alendronate Sodium     REACTION: passed out    Medication list has been reviewed and updated.   General: Denies fever, chills, sweats. No significant weight loss. Eyes: Denies blurring,significant itching ENT: Denies earache, sore throat, and hoarseness.  Cardiovascular: Denies chest pains, palpitations, dyspnea on exertion,  Respiratory: Denies cough, dyspnea at rest,wheeezing Breast: no concerns about lumps GI: Denies nausea, vomiting, diarrhea, constipation, change in bowel habits, abdominal pain, melena, hematochezia GU: Denies dysuria, hematuria, urinary hesitancy, nocturia, denies STD risk, no concerns about discharge Musculoskeletal: Denies back pain, joint pain Derm: Denies rash, itching Neuro: Denies  paresthesias, frequent falls, frequent headaches Psych: Denies depression, anxiety Endocrine: Denies cold intolerance, heat intolerance, polydipsia Heme: Denies enlarged lymph nodes Allergy: No hayfever  Objective:   BP 122/80   Pulse 70   Temp 98.2 F (36.8 C) (Oral)   Ht '4\' 11"'  (1.499 m)   Wt 135 lb (61.2 kg)   BMI 27.27 kg/m  No exam data present  GEN: well developed, well nourished, no acute distress Eyes: conjunctiva and lids normal, PERRLA, EOMI ENT: TM clear,  nares clear, oral exam WNL Neck: supple, no lymphadenopathy, no thyromegaly, no JVD Pulm: clear to auscultation and percussion, respiratory effort normal CV: irreg, irreg. Rate 70 Chest: no scars, masses, no lumps BREAST: breast exam declined GI: soft, non-tender; no hepatosplenomegaly, masses; active bowel sounds all quadrants GU: GU exam declined Lymph: no cervical, axillary or inguinal adenopathy MSK: gait normal, muscle tone and strength WNL, no joint swelling, effusions, discoloration, crepitus  SKIN: clear, good turgor, color WNL, no rashes, lesions, or ulcerations Neuro: normal mental status, normal strength, sensation, and motion Psych: alert; oriented to person, place and time, normally interactive and not anxious or depressed in appearance.   All labs reviewed with patient. Lipids:    Component Value Date/Time   CHOL 169 02/13/2018 1254   TRIG 73.0 02/13/2018 1254   HDL 60.80 02/13/2018 1254  LDLDIRECT 101.2 05/20/2009 0000   VLDL 14.6 02/13/2018 1254   CHOLHDL 3 02/13/2018 1254   CBC: CBC Latest Ref Rng & Units 02/13/2018 08/16/2017 06/27/2017  WBC 4.0 - 10.5 K/uL 6.5 7.0 7.2  Hemoglobin 12.0 - 15.0 g/dL 13.5 12.5 12.0  Hematocrit 36.0 - 46.0 % 41.9 38.4 38.6  Platelets 150.0 - 400.0 K/uL 212.0 198 614    Basic Metabolic Panel:    Component Value Date/Time   NA 143 02/13/2018 1254   NA 144 06/27/2017 1444   K 4.7 02/13/2018 1254   CL 107 02/13/2018 1254   CO2 29 02/13/2018 1254   BUN 16 02/13/2018 1254   BUN 13 06/27/2017 1444   CREATININE 0.87 02/13/2018 1254   GLUCOSE 100 (H) 02/13/2018 1254   CALCIUM 9.7 02/13/2018 1254   Hepatic Function Latest Ref Rng & Units 02/13/2018 04/26/2017 03/20/2017  Total Protein 6.0 - 8.3 g/dL 7.0 6.4 4.7(L)  Albumin 3.5 - 5.2 g/dL 4.1 3.6 2.6(L)  AST 0 - 37 U/L '17 13 16  ' ALT 0 - 35 U/L 9 7 9(L)  Alk Phosphatase 39 - 117 U/L 91 76 47  Total Bilirubin 0.2 - 1.2 mg/dL 0.5 0.3 0.5  Bilirubin, Direct 0.0 - 0.3 mg/dL 0.1 - -     Lab Results  Component Value Date   TSH 1.29 02/13/2018   No results found.  Assessment and Plan:   Healthcare maintenance  Doing well overall at 89. More fatigued than normal.   Requests that I complete additional disability paperwork.   Health Maintenance Exam: The patient's preventative maintenance and recommended screening tests for an annual wellness exam were reviewed in full today. Brought up to date unless services declined.  Counselled on the importance of diet, exercise, and its role in overall health and mortality. The patient's FH and SH was reviewed, including their home life, tobacco status, and drug and alcohol status.  Follow-up in 1 year for physical exam or additional follow-up below.  Follow-up: No follow-ups on file. Or follow-up in 1 year if not noted.  Signed,  Maud Deed. Izeyah Deike, MD   Allergies as of 02/19/2018      Reactions   Latex Rash   Tetanus Toxoid Anaphylaxis   REACTION: anaphylaxis   Alendronate Sodium    REACTION: passed out      Medication List        Accurate as of 02/19/18  8:37 AM. Always use your most recent med list.          docusate sodium 100 MG capsule Commonly known as:  COLACE Take 100 mg by mouth daily.   ICAPS Caps Take 1 capsule by mouth daily.   lisinopril 10 MG tablet Commonly known as:  PRINIVIL,ZESTRIL TAKE 1 TABLET BY MOUTH ONCE DAILY   rivaroxaban 20 MG Tabs tablet Commonly known as:  XARELTO Take 1 tablet (20 mg total) by mouth daily with supper.

## 2018-02-19 ENCOUNTER — Encounter: Payer: Self-pay | Admitting: Family Medicine

## 2018-02-19 ENCOUNTER — Ambulatory Visit (INDEPENDENT_AMBULATORY_CARE_PROVIDER_SITE_OTHER): Payer: PPO | Admitting: Family Medicine

## 2018-02-19 ENCOUNTER — Other Ambulatory Visit: Payer: Self-pay

## 2018-02-19 VITALS — BP 122/80 | HR 70 | Temp 98.2°F | Ht 59.0 in | Wt 135.0 lb

## 2018-02-19 DIAGNOSIS — Z0000 Encounter for general adult medical examination without abnormal findings: Secondary | ICD-10-CM | POA: Diagnosis not present

## 2018-02-20 ENCOUNTER — Ambulatory Visit: Payer: PPO

## 2018-02-21 ENCOUNTER — Ambulatory Visit: Payer: PPO

## 2018-02-21 DIAGNOSIS — R262 Difficulty in walking, not elsewhere classified: Secondary | ICD-10-CM | POA: Diagnosis not present

## 2018-02-21 DIAGNOSIS — M6281 Muscle weakness (generalized): Secondary | ICD-10-CM

## 2018-02-21 NOTE — Therapy (Signed)
[place] [facility]ICINE [address], [place], [address] Phone: [phone]   Fax:  [phone]  Physical Therapy Treatment  Patient Details  Name: Paula Luna MRN: [id number] Date of Birth: 1924-12-13 Referring Provider: Karleen Hampshire copland MD   Encounter Date: 02/21/2018  PT End of Session - 02/21/18 1552    Visit Number  23    Number of Visits  34    Date for PT Re-Evaluation  03/13/18    Authorization Time Period  2/ 10 Progress note    PT Start Time  1500    PT Stop Time  1545    PT Time Calculation (min)  45 min    Equipment Utilized During Treatment  Gait belt    Activity Tolerance  Patient tolerated treatment well;Patient limited by fatigue    Behavior During Therapy  [facility] for tasks assessed/performed       Past Medical History:  Diagnosis Date  . Anemia   . Back pain   . Hypertension 12/25/2013  . Ischemic bowel disease (HCC)    a. 03/2017 abd pain-->internal herina and ischemic jejunum s/p ex lap and small bowel resection.  . Macular degeneration of left eye 01/06/2015  . Osteoporosis   . Persistent atrial fibrillation (HCC)    a. First noted 03/2017;  b. 07/2017 Echo: EF 60-65%, no rwma, mild MR, mildly to mod dil LA; c. 07/2017 48hr Holter: persistent Afib, avg HR of 66 (range 39-140 bpm);  d. CHA2DS2VASc = 4-->Xarelto initiated 07/2017.    Past Surgical History:  Procedure Laterality Date  . CLOSED REDUCTION PROXIMAL HUMERUS FRACTURE Left 2017  . LAPAROSCOPIC SMALL BOWEL RESECTION    . LAPAROTOMY N/A 03/18/2017   Procedure: EXPLORATORY LAPAROTOMY, SMALL BOWEL RESECTION;  Surgeon: Leafy Ro, MD;  Location: [facility] ORS;  Service: General;  Laterality: N/A;  . TOTAL ABDOMINAL HYSTERECTOMY      There were no vitals filed for this visit.  Subjective Assessment - 02/21/18 1518    Subjective  Patient reports she went to her physician who states she is progressing well. Patient states no other changes  other way.     Pertinent History  Small bowel surgery     Limitations  Standing;Walking    Patient Stated Goals  To walk without feeling of falling     Currently in Pain?  No/denies        TREATMENT Therapeutic Exercises Nu step level 5, 8 minutes to improve LE strengthening  Ambulation -- 4 laps (50 feet) (2x100 feet)(150 feet) to improve endurance  Calf raises (on step) -- 20x2 Hip abduction - 2 x 15 Side stepping up and over 4" step - x 10 Single leg heel raises in standing - x 10    Patient demonstrates increased fatigue at end of the session    PT Education - 02/21/18 1532    Education provided  Yes    Education Details  form/technique with exercise performance    Person(s) Educated  Patient    Methods  Explanation;Demonstration    Comprehension  Verbalized understanding;Returned demonstration          PT Long Term Goals - 02/12/18 1112      PT LONG TERM GOAL #1   Title  Patient will be independent with HEP focused on decreasing fall risk to continue benefits of therapy after discharge.     Baseline  Dependent with exercise performance; 01/01/2018: moderate cueing for performance.    Time  6    Period  Weeks    Status  On-going      PT LONG TERM GOAL #2   Title  Patient will improve TUG to under 12 sec to indicate singificant decrease in fall risk.     Baseline  20sec; 01/01/2018: 14sec; 02/12/2018; 11.59 sec    Time  8    Period  Weeks    Status  Achieved      PT LONG TERM GOAL #3   Title  Patient will improve her ability to perform 10mWT to 3782m/s to indicate increased safety with walking at community ambulation    Baseline  .26482m/s; 01/01/2018: .5776m/s; 02/12/2018: .5231m/s    Time  8    Period  Weeks    Status  On-going      PT LONG TERM GOAL #4   Title  Patient will improve 5XSTS to under 16 sec to indicate significant improvement LE functional strength and decrease fall risk.    Baseline  30sec; 01/01/2018: 12 sec    Time  8    Period  Weeks    Status   Achieved            Plan - 02/21/18 1614    Clinical Impression Statement  Patient demonstrates decreased fatigue with exercises today and is able to perform greater amounts of exercises compared to previous sessions indicating functional carryover between visits. Although patient is improving, she demonstrates poor endurance with ambulation and has difficulty with standing for longer periods of time. Patient will benefit from further skilled therapy focused on improving limitations to return to prior level of function.     Rehab Potential  Fair    Clinical Impairments Affecting Rehab Potential  (+) family support, decreased coormorbities (-) age    PT Frequency  2x / week    PT Duration  8 weeks    PT Treatment/Interventions  Balance training;Neuromuscular re-education;Gait training;Patient/family education;Therapeutic exercise;Therapeutic activities;Functional mobility training;Stair training;Moist Heat;Aquatic Therapy;Electrical Stimulation;Cryotherapy;Manual techniques    PT Next Visit Plan  Next visit, attempt more dynamic balance tasks, ambulation over obstacles, ambulation with head turns, and sit to stands.    PT Home Exercise Plan  See education    Consulted and Agree with Plan of Care  Patient;Family member/caregiver    Family Member Consulted  daughter       Patient will benefit from skilled therapeutic intervention in order to improve the following deficits and impairments:  Abnormal gait, Decreased coordination, Decreased endurance, Decreased activity tolerance, Decreased strength, Decreased range of motion, Decreased balance, Difficulty walking  Visit Diagnosis: Difficulty in walking, not elsewhere classified  Muscle weakness (generalized)     Problem List Patient Active Problem List   Diagnosis Date Noted  . Atrial fibrillation (HCC) 04/26/2017  . Anemia 04/26/2017  . 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 Macular  degeneration of left eye 01/06/2015  . Essential hypertension 12/25/2013  . BACK PAIN 08/02/2010    Myrene GalasWesley Gricelda Foland, PT DPT 02/21/2018, 4:19 PM  [facility] [address] [place], [place], [address] Phone: [phone]   Fax:  [phone]  Name: Paula Luna MRN: [id number] Date of Birth: 03-09-1925

## 2018-02-22 ENCOUNTER — Telehealth: Payer: Self-pay | Admitting: Internal Medicine

## 2018-02-22 ENCOUNTER — Telehealth: Payer: Self-pay | Admitting: *Deleted

## 2018-02-22 NOTE — Telephone Encounter (Signed)
Copied from CRM [id number]. Topic: General - Other >> Feb 22, 2018 12:50 PM Trula SladeWalter, Linda F wrote: Reason for CRM:   Patient's daughter, Roger Shelternn Castagna [phone] would like to know if Dr. Patsy Lageropland received a follow up form for Long Term disability from [employer].  She would also like to know the wording Dr. Patsy Lageropland has or is going to put on the form because she is going to ask for an extention for her mother's leave of abcense and she want to say the same thing Dr. Patsy Lageropland says on the form he is filling out.  Please call.

## 2018-02-22 NOTE — Telephone Encounter (Signed)
Received records request from [employer], forwarded to [facility] for processing.

## 2018-02-22 NOTE — Telephone Encounter (Signed)
[name] notified by telephone that we have not received any paperwork from [employer].  She states her mom's leave runs out in July and [employer] needs to know if she is planning on returning to work or will they be requesting an accomodation extension.  She is just wanting to make sure that the same information is relayed between PT and Dr. Patsy Lageropland.  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  She states she has until July 4 to let them know what  Ms. Seelman's plan are.  Dewayne Hatchnn states she know her mom wants to go back to work.

## 2018-02-22 NOTE — Telephone Encounter (Signed)
No. I do not have any forms. I will complete them likely next week, 10-14 days usual time.

## 2018-02-27 ENCOUNTER — Ambulatory Visit: Payer: PPO

## 2018-02-27 DIAGNOSIS — M6281 Muscle weakness (generalized): Secondary | ICD-10-CM

## 2018-02-27 DIAGNOSIS — R262 Difficulty in walking, not elsewhere classified: Secondary | ICD-10-CM

## 2018-02-27 NOTE — Therapy (Signed)
[facility]IC[facility] [address], [place], [address] Phone: [phone]   Fax:  [phone]  Physical Therapy Treatment  Patient Details  Name: Paula Luna MRN: [id number] Date of Birth: Nov 14, 1924 Referring Provider: Karleen Hampshire copland MD   Encounter Date: 02/27/2018  PT End of Session - 02/27/18 1136    Visit Number  24    Number of Visits  34    Date for PT Re-Evaluation  03/13/18    Authorization Time Period  3/ 10 Progress note    PT Start Time  1120    PT Stop Time  1205    PT Time Calculation (min)  45 min    Equipment Utilized During Treatment  Gait belt    Activity Tolerance  Patient tolerated treatment well;Patient limited by fatigue    Behavior During Therapy  [facility] for tasks assessed/performed       Past Medical History:  Diagnosis Date  . Anemia   . Back pain   . Hypertension 12/25/2013  . Ischemic bowel disease (HCC)    a. 03/2017 abd pain-->internal herina and ischemic jejunum s/p ex lap and small bowel resection.  . Macular degeneration of left eye 01/06/2015  . Osteoporosis   . Persistent atrial fibrillation (HCC)    a. First noted 03/2017;  b. 07/2017 Echo: EF 60-65%, no rwma, mild MR, mildly to mod dil LA; c. 07/2017 48hr Holter: persistent Afib, avg HR of 66 (range 39-140 bpm);  d. CHA2DS2VASc = 4-->Xarelto initiated 07/2017.    Past Surgical History:  Procedure Laterality Date  . CLOSED REDUCTION PROXIMAL HUMERUS FRACTURE Left 2017  . LAPAROSCOPIC SMALL BOWEL RESECTION    . LAPAROTOMY N/A 03/18/2017   Procedure: EXPLORATORY LAPAROTOMY, SMALL BOWEL RESECTION;  Surgeon: Leafy Ro, MD;  Location: [facility] ORS;  Service: General;  Laterality: N/A;  . TOTAL ABDOMINAL HYSTERECTOMY      There were no vitals filed for this visit.  Subjective Assessment - 02/27/18 1132    Subjective  Patient reports she is feeling tired today but reports she feels she is improving. Patient states decreased  strength overall.     Pertinent History  Small bowel surgery     Limitations  Standing;Walking    Patient Stated Goals  To walk without feeling of falling     Currently in Pain?  No/denies         TREATMENT Therapeutic Exercises Nu step level 4, 5 minutes to improve LE strengthening  Ambulation -- (50 feet) (200 feet) (50 feet)(100 feet)  to improve endurance Step downs and up backward with 6" step - x 15  Side stepping with UE support - 4 x 82ft   Patient demonstrates increased fatigue at end of the session    PT Education - 02/27/18 1135    Education provided  Yes    Education Details  form/technique with exercise performance.     Person(s) Educated  Patient    Methods  Explanation;Demonstration    Comprehension  Verbalized understanding;Returned demonstration          PT Long Term Goals - 02/12/18 1112      PT LONG TERM GOAL #1   Title  Patient will be independent with HEP focused on decreasing fall risk to continue benefits of therapy after discharge.     Baseline  Dependent with exercise performance; 01/01/2018: moderate cueing for performance.    Time  6    Period  Weeks  Status  On-going      PT LONG TERM GOAL #2   Title  Patient will improve TUG to under 12 sec to indicate singificant decrease in fall risk.     Baseline  20sec; 01/01/2018: 14sec; 02/12/2018; 11.59 sec    Time  8    Period  Weeks    Status  Achieved      PT LONG TERM GOAL #3   Title  Patient will improve her ability to perform 10mWT to 7388m/s to indicate increased safety with walking at community ambulation    Baseline  .57488m/s; 01/01/2018: .3442m/s; 02/12/2018: .3483m/s    Time  8    Period  Weeks    Status  On-going      PT LONG TERM GOAL #4   Title  Patient will improve 5XSTS to under 16 sec to indicate significant improvement LE functional strength and decrease fall risk.    Baseline  30sec; 01/01/2018: 12 sec    Time  8    Period  Weeks    Status  Achieved            Plan - 02/27/18  1155    Clinical Impression Statement  Patient demonstrates increased fatigue today with exercise and requires greater amount of sitting rest breaks compared to previous sessions. Patient was able to ambulate for 45400ft which is sustained amount of distance compared to the previous session indicating funcitonal improvement. Patient demonstrates poor muscular endurance and patient will benefit from further skilled therapy to return to prior level of function.     Rehab Potential  Fair    Clinical Impairments Affecting Rehab Potential  (+) family support, decreased coormorbities (-) age    PT Frequency  2x / week    PT Duration  8 weeks    PT Treatment/Interventions  Balance training;Neuromuscular re-education;Gait training;Patient/family education;Therapeutic exercise;Therapeutic activities;Functional mobility training;Stair training;Moist Heat;Aquatic Therapy;Electrical Stimulation;Cryotherapy;Manual techniques    PT Next Visit Plan  Next visit, attempt more dynamic balance tasks, ambulation over obstacles, ambulation with head turns, and sit to stands.    PT Home Exercise Plan  See education    Consulted and Agree with Plan of Care  Patient;Family member/caregiver    Family Member Consulted  daughter       Patient will benefit from skilled therapeutic intervention in order to improve the following deficits and impairments:  Abnormal gait, Decreased coordination, Decreased endurance, Decreased activity tolerance, Decreased strength, Decreased range of motion, Decreased balance, Difficulty walking  Visit Diagnosis: Difficulty in walking, not elsewhere classified  Muscle weakness (generalized)     Problem List Patient Active Problem List   Diagnosis Date Noted  . Atrial fibrillation (HCC) 04/26/2017  . Anemia 04/26/2017  . 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 Macular degeneration of left eye 01/06/2015  . Essential hypertension 12/25/2013   . BACK PAIN 08/02/2010    Paula Luna, PT DPT 02/27/2018, 1:12 PM  [facility] [address] [place], [place], [address] Phone: [phone]   Fax:  [phone]  Name: Lattie CornsRoxanne D Katona MRN: [id number] Date of Birth: 1924/11/22

## 2018-02-28 ENCOUNTER — Telehealth: Payer: Self-pay

## 2018-02-28 ENCOUNTER — Telehealth: Payer: Self-pay | Admitting: *Deleted

## 2018-02-28 NOTE — Telephone Encounter (Signed)
Spoke with Dewayne HatchAnn.  She needs us to fax office notes, lab results and med list from 03/17/2017 through 03/17/2018 to [phone].  Claim # [id number].  Records faxed as requested.

## 2018-02-28 NOTE — Telephone Encounter (Signed)
Received fax for patient from [employer]: Restrictions Form re: longterm disability benefits. Dr End reviewed and determined this would be best completed by PCP. Patient last saw PCP 02/19/18.  Called and patient said her daughter will be home soon and it would be best to speak with her.  Called daughter on cell phone. Let her know Dr Serita KyleEnd's recommendations. She shared that patient would like to return to work but she is not sure patient is physically able. Patient is currently participating in physical therapy. Daughter does not feel Dr Patsy Lageropland feels patient is ready to return to work. Advised her to reach out to [employer] and PCP regarding form completion. She also asked if patient could travel on an airplane about 1 hour trip. Advised her to asked PCP as well. She verbalized understanding and will call me back if she needs anything else from our office.

## 2018-02-28 NOTE — Telephone Encounter (Signed)
Copied from CRM [id number]. Topic: Inquiry >> Feb 28, 2018 11:59 AM Yvonna Alanisobinson, Andra M wrote: Reason for CRM: Dewayne HatchAnn, the daughter of patient Midge AverRoxanne Cangelosi called. She is wanting to speak with Miss Lupita LeashDonna or someone in the office regarding extended leave paperwork for the patient's job at [employer]. Please call Miss Dewayne Hatchnn at [phone]. Miss Dewayne Hatchnn states there is a deadline for the paperwork. [name] also needs to know the terminology Dr. Patsy Lageropland will use for patient's absence.       Thank You!!!

## 2018-02-28 NOTE — Telephone Encounter (Signed)
See 02/22/18 phone note.

## 2018-02-28 NOTE — Telephone Encounter (Signed)
See phone note on 02/28/18 also.

## 2018-02-28 NOTE — Telephone Encounter (Signed)
Copied from CRM [id number]. Topic: Inquiry >> Feb 28, 2018 11:59 AM Yvonna Alanisobinson, Andra M wrote: Reason for CRM: Dewayne HatchAnn, the daughter of patient [name] called. She is wanting to speak with Miss Lupita LeashDonna or someone in the office regarding extended leave paperwork for the patient's job at [employer]. Please call Miss Dewayne Hatchnn at [phone]. Miss Dewayne Hatchnn states there is a deadline for the paperwork. [name] also needs to know the terminology Dr. Patsy Lageropland will use for patient's absence.       Thank You!!!     >> Feb 28, 2018  3:10 PM Terisa Starraylor, Brittany L wrote: Patient's daughter Dewayne Hatchnn would like Lupita LeashDonna to call her back, she has a lot of detailed information she needs to tell her. Please call her at @ [phone]

## 2018-02-28 NOTE — Telephone Encounter (Signed)
Left message for Paula Luna that Dr. Patsy Lageropland has not received any paperwork, from [employer] [employer] [facility], for him to fill out.  I ask her to contact them again and have them fax the forms directly to my fax at [phone].

## 2018-02-28 NOTE — Telephone Encounter (Signed)
I am very happy to do FMLA or disability paperwork for her.  Unfortunately, I have not received any.  Dewayne Hatchnn may need to help her mother call [employer] to ensure needed paperwork is sent to me.

## 2018-03-01 ENCOUNTER — Ambulatory Visit: Payer: PPO

## 2018-03-01 DIAGNOSIS — R262 Difficulty in walking, not elsewhere classified: Secondary | ICD-10-CM | POA: Diagnosis not present

## 2018-03-01 DIAGNOSIS — M6281 Muscle weakness (generalized): Secondary | ICD-10-CM

## 2018-03-01 NOTE — Telephone Encounter (Signed)
[name] called back. She states [employer] is going to be faxing paperwork (leave of absence extension accommodation) to Dr. Windell Mouldingopland/Donna at both fax #s [phone] and [phone]. This needs returned to [facility] by July 10. Please list pts restrictions for returning to work and a return to work date.

## 2018-03-01 NOTE — Therapy (Signed)
[facility] PHYSICAL AND SPORTS MEDICINE [address] [place], [place], [address] Phone: [phone]   Fax:  [phone]  Physical Therapy Treatment  Patient Details  Name: Paula CornsRoxanne D Brensinger MRN: [id number] Date of Birth: Apr 10, 1925 Referring Provider: Karleen Hampshirespencer copland MD   Encounter Date: 03/01/2018  PT End of Session - 03/01/18 1118    Visit Number  25    Number of Visits  34    Date for PT Re-Evaluation  03/13/18    Authorization Type  4/6 FOTO    Authorization Time Period  4/10 Progress note    PT Start Time  1035    PT Stop Time  1115    PT Time Calculation (min)  40 min    Equipment Utilized During Treatment  Gait belt    Activity Tolerance  Patient tolerated treatment well;Patient limited by fatigue    Behavior During Therapy  WFL for tasks assessed/performed       Past Medical History:  Diagnosis Date  . Anemia   . Back pain   . Hypertension 12/25/2013  . Ischemic bowel disease (HCC)    a. 03/2017 abd pain-->internal herina and ischemic jejunum s/p ex lap and small bowel resection.  . Macular degeneration of left eye 01/06/2015  . Osteoporosis   . Persistent atrial fibrillation (HCC)    a. First noted 03/2017;  b. 07/2017 Echo: EF 60-65%, no rwma, mild MR, mildly to mod dil LA; c. 07/2017 48hr Holter: persistent Afib, avg HR of 66 (range 39-140 bpm);  d. CHA2DS2VASc = 4-->Xarelto initiated 07/2017.    Past Surgical History:  Procedure Laterality Date  . CLOSED REDUCTION PROXIMAL HUMERUS FRACTURE Left 2017  . LAPAROSCOPIC SMALL BOWEL RESECTION    . LAPAROTOMY N/A 03/18/2017   Procedure: EXPLORATORY LAPAROTOMY, SMALL BOWEL RESECTION;  Surgeon: Leafy RoPabon, Diego F, MD;  Location: [facility] ORS;  Service: General;  Laterality: N/A;  . TOTAL ABDOMINAL HYSTERECTOMY      There were no vitals filed for this visit.  Subjective Assessment - 03/01/18 1420    Subjective  Patient stated that she is feeling well today. No significant changes since  previous session.    Pertinent History  Small bowel surgery     Limitations  Standing;Walking    Patient Stated Goals  To walk without feeling of falling     Currently in Pain?  No/denies    Pain Score  0-No pain       TREATMENT Therapeutic Exercise Nu Step level 4 x8 min to improve LE strength and cardiovascular endurance Side stepping on airex beam x10 Tandem amb on airex beam x5 Amb 2300ft, 150 ft to improve functional mobility and increase endurance Standing kicking soccer ball BLE to improve dynamic balance , good stepping response noted  Sit to stand 10x2 (YTB around knees to decrease knee valgus)   Pt demonstrates increased fatigue at end of session.     PT Education - 03/01/18 1046    Education provided  Yes    Education Details  Patient educated on proper form and technique with exercises.    Person(s) Educated  Patient    Methods  Explanation;Demonstration    Comprehension  Verbalized understanding;Returned demonstration          PT Long Term Goals - 02/12/18 1112      PT LONG TERM GOAL #1   Title  Patient will be independent with HEP focused on decreasing fall risk to continue benefits of therapy after discharge.  Baseline  Dependent with exercise performance; 01/01/2018: moderate cueing for performance.    Time  6    Period  Weeks    Status  On-going      PT LONG TERM GOAL #2   Title  Patient will improve TUG to under 12 sec to indicate singificant decrease in fall risk.     Baseline  20sec; 01/01/2018: 14sec; 02/12/2018; 11.59 sec    Time  8    Period  Weeks    Status  Achieved      PT LONG TERM GOAL #3   Title  Patient will improve her ability to perform to 83m/s to indicate increased safety with walking at community ambulation    Baseline  .1m/s; 01/01/2018: .47m/s; 02/12/2018: .11m/s    Time  8    Period  Weeks    Status  On-going      PT LONG TERM GOAL #4   Title  Patient will improve 5XSTS to under 16 sec to indicate significant  improvement LE functional strength and decrease fall risk.    Baseline  30sec; 01/01/2018: 12 sec    Time  8    Period  Weeks    Status  Achieved            Plan - 03/01/18 1119    Clinical Impression Statement  Patient demonstrates decreased cardiovascular endurance and is able to ambulate 200 ft before requesting a rest break. Patient demonstrates improved tolerance to balance activities and also demonstrated a good stepping repsonse with dynamic balance task. Pt was able to tolerate 20 total sit to stands with one rest break demonstrating functional improvement. Patient will continue to benefit from skilled PT in order to return to prior level of function.     Clinical Impairments Affecting Rehab Potential  (+) family support, decreased coormorbities (-) age    PT Frequency  2x / week    PT Duration  8 weeks    PT Treatment/Interventions  Balance training;Neuromuscular re-education;Gait training;Patient/family education;Therapeutic exercise;Therapeutic activities;Functional mobility training;Stair training;Moist Heat;Aquatic Therapy;Electrical Stimulation;Cryotherapy;Manual techniques    PT Next Visit Plan  Attempt more dynamic balance tasks, ambulation over obstacles, ambulation with head turns, stepping response tasks    PT Home Exercise Plan  See education    Consulted and Agree with Plan of Care  Patient       Patient will benefit from skilled therapeutic intervention in order to improve the following deficits and impairments:  Abnormal gait, Decreased coordination, Decreased endurance, Decreased activity tolerance, Decreased strength, Decreased range of motion, Decreased balance, Difficulty walking  Visit Diagnosis: Difficulty in walking, not elsewhere classified  Muscle weakness (generalized)     Problem List Patient Active Problem List   Diagnosis Date Noted  . Atrial fibrillation (HCC) 04/26/2017  . Anemia 04/26/2017  . 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 Macular degeneration of left eye 01/06/2015  . Essential hypertension 12/25/2013  . BACK PAIN 08/02/2010    Mellody Dance, SPT 03/01/2018, 2:27 PM  [place] [facility] PHYSICAL AND SPORTS MEDICINE [address], [place], [address] Phone: [phone]   Fax:  [phone]  Name: OLINDA NOLA MRN: [id number] Date of Birth: 1925-06-29

## 2018-03-01 NOTE — Telephone Encounter (Signed)
Forms in Dr. Cyndie Chimeopland's in box to complete.

## 2018-03-02 NOTE — Telephone Encounter (Signed)
done

## 2018-03-02 NOTE — Telephone Encounter (Signed)
Forms from [facility] faxed back to [phone] and [employer] faxed back to [phone].

## 2018-03-05 ENCOUNTER — Telehealth: Payer: Self-pay

## 2018-03-05 NOTE — Telephone Encounter (Signed)
Lupita LeashDonna  Can you help with this? On previous note it looks like you faxed information on 6/19 Thanks

## 2018-03-05 NOTE — Telephone Encounter (Signed)
[name] notified all paperwork from [facility] and [employer] was completed and faxed last Friday, 03/02/2018.  I advised that Dr. Patsy Lageropland thought [name] might could do part time with sedentary work  >50% of the time with max lifting of 5 lbs.

## 2018-03-05 NOTE — Telephone Encounter (Signed)
Copied from CRM [id number]. Topic: Inquiry >> Feb 28, 2018 11:59 AM Yvonna Alanisobinson, Andra M wrote: Reason for CRM: Dewayne HatchAnn, the daughter of patient Midge AverRoxanne Librizzi called. She is wanting to speak with Miss Lupita LeashDonna or someone in the office regarding extended leave paperwork for the patient's job at [employer]. Please call Miss Dewayne Hatchnn at [phone]. Miss Dewayne Hatchnn states there is a deadline for the paperwork. [name] also needs to know the terminology Dr. Patsy Lageropland will use for patient's absence.       Thank You!!!     >> Feb 28, 2018  3:10 PM Terisa Starraylor, Brittany L wrote: Patient's daughter Dewayne Hatchnn would like Lupita LeashDonna to call her back, she has a lot of detailed information she needs to tell her. Please call her at @ [phone] >> Mar 05, 2018 11:21 AM Gerrianne ScalePayne, Angela L wrote: Pt daughter Roger Shelternn Castagna [phone] calling to seee if paperwork has been sent off to [employer]

## 2018-03-06 ENCOUNTER — Ambulatory Visit: Payer: PPO

## 2018-03-06 DIAGNOSIS — R262 Difficulty in walking, not elsewhere classified: Secondary | ICD-10-CM

## 2018-03-06 DIAGNOSIS — M6281 Muscle weakness (generalized): Secondary | ICD-10-CM

## 2018-03-06 NOTE — Therapy (Signed)
[facility] PHYSICAL AND SPORTS MEDICINE [address] [place], [place], [address] Phone: [phone]   Fax:  [phone]  Physical Therapy Treatment  Patient Details  Name: Paula Luna MRN: [id number] Date of Birth: 05-16-1925 Referring Provider: Karleen Hampshirespencer copland MD   Encounter Date: 03/06/2018  PT End of Session - 03/06/18 1158    Visit Number  26    Number of Visits  34    Date for PT Re-Evaluation  03/13/18    Authorization Type  5/6 FOTO    Authorization Time Period  5/10 Progress note    PT Start Time  1115    PT Stop Time  1200    PT Time Calculation (min)  45 min    Equipment Utilized During Treatment  Gait belt    Activity Tolerance  Patient tolerated treatment well;Patient limited by fatigue    Behavior During Therapy  [facility] for tasks assessed/performed       Past Medical History:  Diagnosis Date  . Anemia   . Back pain   . Hypertension 12/25/2013  . Ischemic bowel disease (HCC)    a. 03/2017 abd pain-->internal herina and ischemic jejunum s/p ex lap and small bowel resection.  . Macular degeneration of left eye 01/06/2015  . Osteoporosis   . Persistent atrial fibrillation (HCC)    a. First noted 03/2017;  b. 07/2017 Echo: EF 60-65%, no rwma, mild MR, mildly to mod dil LA; c. 07/2017 48hr Holter: persistent Afib, avg HR of 66 (range 39-140 bpm);  d. CHA2DS2VASc = 4-->Xarelto initiated 07/2017.    Past Surgical History:  Procedure Laterality Date  . CLOSED REDUCTION PROXIMAL HUMERUS FRACTURE Left 2017  . LAPAROSCOPIC SMALL BOWEL RESECTION    . LAPAROTOMY N/A 03/18/2017   Procedure: EXPLORATORY LAPAROTOMY, SMALL BOWEL RESECTION;  Surgeon: Leafy RoPabon, Diego F, MD;  Location: [facility] ORS;  Service: General;  Laterality: N/A;  . TOTAL ABDOMINAL HYSTERECTOMY      There were no vitals filed for this visit.  Subjective Assessment - 03/06/18 1117    Subjective  Pt stated that she is feeling "fine" and walked into a chair causing some  soreness in L upper thigh.. Pt also stated that she has been walking a lot recently but has been feeling tired overall..    Pertinent History  Small bowel surgery     Limitations  Standing;Walking    Patient Stated Goals  To walk without feeling of falling     Currently in Pain?  No/denies    Pain Score  0-No pain    Multiple Pain Sites  No         TREATMENT Therapeutic Exercises Nu step level 4 x10 minutes to improve muscular endurance and cardiovascular endurance Step ups 6" x15 B one UE support  Balance stones marches x20  Balance stones step ups (decrease UE support) 10x2 Ambulation 3 laps (300 ft) with no rest breaks, 1.5 laps no rest break to improve endurance Airex beam tandem amb x6, lateral step x6   Patient fatigued at end of session    PT Education - 03/06/18 1157    Education provided  Yes    Education Details  Patient educated on proper form and technique for therapeutic exrcises.    Person(s) Educated  Patient    Methods  Explanation;Demonstration    Comprehension  Verbalized understanding;Returned demonstration          PT Long Term Goals - 02/12/18 1112      PT  LONG TERM GOAL #1   Title  Patient will be independent with HEP focused on decreasing fall risk to continue benefits of therapy after discharge.     Baseline  Dependent with exercise performance; 01/01/2018: moderate cueing for performance.    Time  6    Period  Weeks    Status  On-going      PT LONG TERM GOAL #2   Title  Patient will improve TUG to under 12 sec to indicate singificant decrease in fall risk.     Baseline  20sec; 01/01/2018: 14sec; 02/12/2018; 11.59 sec    Time  8    Period  Weeks    Status  Achieved      PT LONG TERM GOAL #3   Title  Patient will improve her ability to perform to 25m/s to indicate increased safety with walking at community ambulation    Baseline  .18m/s; 01/01/2018: .24m/s; 02/12/2018: .15m/s    Time  8    Period  Weeks    Status  On-going      PT LONG  TERM GOAL #4   Title  Patient will improve 5XSTS to under 16 sec to indicate significant improvement LE functional strength and decrease fall risk.    Baseline  30sec; 01/01/2018: 12 sec    Time  8    Period  Weeks    Status  Achieved            Plan - 03/06/18 1159    Clinical Impression Statement  Patient presents with improved cadiovasclar endurance demonstrated by ability to tolerate 10 minutes on the nu step at level 4. Patient also demonstrates improved balanced as seen in her ability to decrase UE support from two hands to one hand. Patient still continues to demonstrate fatigue with therapeutic exrecises and reqired frequent rest breaks. Patient will continue to benefit from skilled PT in order to return to prior level of function.    Rehab Potential  Fair    Clinical Impairments Affecting Rehab Potential  (+) family support, decreased coormorbities (-) age    PT Frequency  2x / week    PT Duration  8 weeks    PT Treatment/Interventions  Balance training;Neuromuscular re-education;Gait training;Patient/family education;Therapeutic exercise;Therapeutic activities;Functional mobility training;Stair training;Moist Heat;Aquatic Therapy;Electrical Stimulation;Cryotherapy;Manual techniques    PT Next Visit Plan  Attempt more dynamic balance tasks, ambulation over obstacles, ambulation with head turns, stepping response tasks    PT Home Exercise Plan  See education    Consulted and Agree with Plan of Care  Patient       Patient will benefit from skilled therapeutic intervention in order to improve the following deficits and impairments:  Abnormal gait, Decreased coordination, Decreased endurance, Decreased activity tolerance, Decreased strength, Decreased range of motion, Decreased balance, Difficulty walking  Visit Diagnosis: Muscle weakness (generalized)  Difficulty in walking, not elsewhere classified     Problem List Patient Active Problem List   Diagnosis Date Noted  .  Atrial fibrillation (HCC) 04/26/2017  . Anemia 04/26/2017  . 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 Macular degeneration of left eye 01/06/2015  . Essential hypertension 12/25/2013  . BACK PAIN 08/02/2010    Mellody Dance, SPT 03/06/2018, 12:03 PM  [facility] PHYSICAL AND SPORTS MEDICINE [address] [place], [address] Phone: [phone]   Fax:  [phone]  Name: Paula Luna MRN: [id number] Date of Birth: 1925-03-07

## 2018-03-08 ENCOUNTER — Ambulatory Visit: Payer: PPO

## 2018-03-12 ENCOUNTER — Telehealth: Payer: Self-pay | Admitting: Family Medicine

## 2018-03-12 ENCOUNTER — Ambulatory Visit: Payer: PPO | Attending: Family Medicine

## 2018-03-12 DIAGNOSIS — M6281 Muscle weakness (generalized): Secondary | ICD-10-CM | POA: Diagnosis not present

## 2018-03-12 DIAGNOSIS — R262 Difficulty in walking, not elsewhere classified: Secondary | ICD-10-CM | POA: Insufficient documentation

## 2018-03-12 NOTE — Therapy (Signed)
[place] [facility] PHYS[facility]TS MEDICINE [address] [place], [address] Phone: [phone]   Fax:  [phone]  Physical Therapy Treatment  Patient Details  Name: Paula Luna MRN: [id number] Date of Birth: Mar 22, 1925 Referring Provider: Karleen Hampshire copland MD   Encounter Date: 03/12/2018  PT End of Session - 03/12/18 1714    Visit Number  27    Number of Visits  34    Date for PT Re-Evaluation  03/13/18    Authorization Type  6/6 FOTO    Authorization Time Period  6/10 Progress note    PT Start Time  1430    PT Stop Time  1510    PT Time Calculation (min)  40 min    Equipment Utilized During Treatment  Gait belt    Activity Tolerance  Patient limited by fatigue    Behavior During Therapy  WFL for tasks assessed/performed       Past Medical History:  Diagnosis Date  . Anemia   . Back pain   . Hypertension 12/25/2013  . Ischemic bowel disease (HCC)    a. 03/2017 abd pain-->internal herina and ischemic jejunum s/p ex lap and small bowel resection.  . Macular degeneration of left eye 01/06/2015  . Osteoporosis   . Persistent atrial fibrillation (HCC)    a. First noted 03/2017;  b. 07/2017 Echo: EF 60-65%, no rwma, mild MR, mildly to mod dil LA; c. 07/2017 48hr Holter: persistent Afib, avg HR of 66 (range 39-140 bpm);  d. CHA2DS2VASc = 4-->Xarelto initiated 07/2017.    Past Surgical History:  Procedure Laterality Date  . CLOSED REDUCTION PROXIMAL HUMERUS FRACTURE Left 2017  . LAPAROSCOPIC SMALL BOWEL RESECTION    . LAPAROTOMY N/A 03/18/2017   Procedure: EXPLORATORY LAPAROTOMY, SMALL BOWEL RESECTION;  Surgeon: Leafy Ro, MD;  Location: [facility] ORS;  Service: General;  Laterality: N/A;  . TOTAL ABDOMINAL HYSTERECTOMY      There were no vitals filed for this visit.  Subjective Assessment - 03/12/18 1711    Subjective  Pt states that she has been feeling slightly "foggy" today. Additionally, pt reported that she did a lot more  walking than usual over the weekend.     Pertinent History  Small bowel surgery     Limitations  Standing;Walking    Patient Stated Goals  To walk without feeling of falling     Currently in Pain?  No/denies         TREATMENT Therapeutic Exercises Nu step level 4 x10 min to improve LE strength and endurance Slow high knees on airex pad with focus on decreasing UE support 20x2 Amb 1.5 lap (150 ft) x2 increased fatigue during today's session Kicking soccer ball to improve dynamic balance and stepping responses x20  Increased fatigue at end of session. Increased amount of rest breaks required today.     PT Education - 03/12/18 1713    Education provided  Yes    Education Details  Patient educated on proper form and technique for therapeutic exercises. Pt also educated on the importance of having a snack or lunch prior to later appointments to avoid feelings of lightheadedness or "fogginess".    Person(s) Educated  Patient    Methods  Explanation;Demonstration    Comprehension  Verbalized understanding;Returned demonstration          PT Long Term Goals - 02/12/18 1112      PT LONG TERM GOAL #1   Title  Patient will be independent  with HEP focused on decreasing fall risk to continue benefits of therapy after discharge.     Baseline  Dependent with exercise performance; 01/01/2018: moderate cueing for performance.    Time  6    Period  Weeks    Status  On-going      PT LONG TERM GOAL #2   Title  Patient will improve TUG to under 12 sec to indicate singificant decrease in fall risk.     Baseline  20sec; 01/01/2018: 14sec; 02/12/2018; 11.59 sec    Time  8    Period  Weeks    Status  Achieved      PT LONG TERM GOAL #3   Title  Patient will improve her ability to perform 10mWT to 5454m/s to indicate increased safety with walking at community ambulation    Baseline  .24454m/s; 01/01/2018: .3133m/s; 02/12/2018: .5368m/s    Time  8    Period  Weeks    Status  On-going      PT LONG TERM  GOAL #4   Title  Patient will improve 5XSTS to under 16 sec to indicate significant improvement LE functional strength and decrease fall risk.    Baseline  30sec; 01/01/2018: 12 sec    Time  8    Period  Weeks    Status  Achieved            Plan - 03/12/18 1716    Clinical Impression Statement  Patient presents with increased fatigue today and decreased tolerance to therapeutic exercise. Pts vitals were assessed after reporting feeling of "fogginess" (BP 141/83, HR 64). Pt was only able to tolerate 1.5 laps prior to rest break during todays session and needed frequent rest breaks. Pt will benefit from continued skilled PT in order to return to prior level of function.    Rehab Potential  Fair    Clinical Impairments Affecting Rehab Potential  (+) family support, decreased coormorbities (-) age    PT Frequency  2x / week    PT Duration  8 weeks    PT Treatment/Interventions  Balance training;Neuromuscular re-education;Gait training;Patient/family education;Therapeutic exercise;Therapeutic activities;Functional mobility training;Stair training;Moist Heat;Aquatic Therapy;Electrical Stimulation;Cryotherapy;Manual techniques    PT Next Visit Plan  Attempt more dynamic balance tasks, ambulation over obstacles, ambulation with head turns, stepping response tasks    PT Home Exercise Plan  See education    Consulted and Agree with Plan of Care  Patient       Patient will benefit from skilled therapeutic intervention in order to improve the following deficits and impairments:  Abnormal gait, Decreased coordination, Decreased endurance, Decreased activity tolerance, Decreased strength, Decreased range of motion, Decreased balance, Difficulty walking  Visit Diagnosis: Muscle weakness (generalized)  Difficulty in walking, not elsewhere classified     Problem List Patient Active Problem List   Diagnosis Date Noted  . Atrial fibrillation (HCC) 04/26/2017  . Anemia 04/26/2017  . Ischemic  bowel disease (HCC) 03/18/2017  . 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 Macular degeneration of left eye 01/06/2015  . Essential hypertension 12/25/2013  . BACK PAIN 08/02/2010    Mellody DanceKaren Tyjon Bowen, PT DPT 03/12/2018, 5:20 PM  [place] MEDICINE [address] [place], [place], [address] Phone: [phone]   Fax:  [phone]  Name: Paula Luna MRN: [id number] Date of Birth: 08-Dec-1924

## 2018-03-12 NOTE — Telephone Encounter (Signed)
See below crm °

## 2018-03-12 NOTE — Telephone Encounter (Signed)
Copied from CRM [phone]#[id number]. Topic: Quick Communication - See Telephone Encounter >> Mar 12, 2018  9:57 AM Raquel SarnaHayes, Teresa G wrote: Pt's Daughter [name] called checking on 2 things for her mother the pt:  Did the office receive the [facility] that pt received her 1st shingles vaccine on June 26? Was the leave of absence form filled out by Dr. Patsy Lageropland and faxed?   Daughter needing to know the medical condition put on the form so she can put the same on other forms for pt.   Please call daughter back to let her know.  Cannot accept calls until after 4 pm.  Can leave a voice mail message.

## 2018-03-12 NOTE — Telephone Encounter (Signed)
Left message for Paula Hatchnn that we did received the Shingrix [facility] information [facility] and that information has been entered in Ms Lanice ShirtsMarinos' chart.  All forms from [facility] and [employer] has been completed and faxed back. The diagnosis Dr. Patsy Lageropland put of forms were:  Ischemic Necrosis of Bowel, Atrial Fib, Weakness, Physical Deconditioning, Difficulty Walking and Advanced Age.

## 2018-03-19 ENCOUNTER — Encounter: Payer: Self-pay | Admitting: Internal Medicine

## 2018-03-19 ENCOUNTER — Ambulatory Visit: Payer: Self-pay | Admitting: Family Medicine

## 2018-03-19 NOTE — Telephone Encounter (Signed)
This encounter was created in error - please disregard.

## 2018-03-19 NOTE — Telephone Encounter (Signed)
Pt's daughter called to report resolved headache but new onset of left shoulder pain. She is having mild pain to upper left deltoid.  Pt was at a cookout and someone went to hug her. The person who went to hug pt lost her balance and fell against pt's left shoulder and bumped hard against pt's shoulder. Pt's daughter called to see if pt could take Ibuprofen. Cautioned given about taking Ibuprofen due to interaction and bleeding risk with another med (pt takes Xarelto). Care advice given. Appt made for 04/02/18 with PCP. Pt's daughter to call back if she worsens.  Reason for Disposition . [1] MILD pain (e.g., does not interfere with normal activities) AND [2] present > 7 days  Answer Assessment - Initial Assessment Questions 1. ONSET: "When did the pain start?"     July 4 2. LOCATION: "Where is the pain located?"     Top of the muscle (deltoid) on the left 3. PAIN: "How bad is the pain?" (Scale 1-10; or mild, moderate, severe)   - MILD (1-3): doesn't interfere with normal activities   - MODERATE (4-7): interferes with normal activities (e.g., work or school) or awakens from sleep   - SEVERE (8-10): excruciating pain, unable to do any normal activities, unable to move arm at all due to pain     mild 4. WORK OR EXERCISE: "Has there been any recent work or exercise that involved this part of the body?"     no 5. CAUSE: "What do you think is causing the shoulder pain?"     A woman bumped up against her shoulder 6. OTHER SYMPTOMS: "Do you have any other symptoms?" (e.g., neck pain, swelling, rash, fever, numbness, weakness)    no 7. PREGNANCY: "Is there any chance you are pregnant?" "When was your last menstrual period?"     n/a  Protocols used: SHOULDER PAIN-A-AH

## 2018-03-19 NOTE — Telephone Encounter (Signed)
[employer] called, states Dr. Okey DupreEnd needs to fill out disabilty forms, not Dr. Dallas Schimkeopeland, PCP. Received records request [employer], [facility].

## 2018-03-21 ENCOUNTER — Ambulatory Visit: Payer: PPO

## 2018-03-23 ENCOUNTER — Ambulatory Visit: Payer: Self-pay | Admitting: Family Medicine

## 2018-03-23 NOTE — Telephone Encounter (Signed)
Pt's daughter (who is with pt) called to report pt having a sore throat. Throat began hurting yesterday and pt states pain is mild. Pt has not been exposed to anyone with strep and no pus on the tonsils was noted. Pt had a runny nose yesterday. No fever. Home care advice given and daughter verbalized understanding.  Reason for Disposition . [1] Sore throat is the only symptom AND [2] sore throat present < 48 hours  Answer Assessment - Initial Assessment Questions 1. ONSET: "When did the throat start hurting?" (Hours or days ago)      yesterday 2. SEVERITY: "How bad is the sore throat?" (Scale 1-10; mild, moderate or severe)   - MILD (1-3):  doesn't interfere with eating or normal activities   - MODERATE (4-7): interferes with eating some solids and normal activities   - SEVERE (8-10):  excruciating pain, interferes with most normal activities   - SEVERE DYSPHAGIA: can't swallow liquids, drooling     mild 3. STREP EXPOSURE: "Has there been any exposure to strep within the past week?" If so, ask: "What type of contact occurred?"      no 4.  VIRAL SYMPTOMS: "Are there any symptoms of a cold, such as a runny nose, cough, hoarse voice or red eyes?"      yest runny nose 5. FEVER: "Do you have a fever?" If so, ask: "What is your temperature, how was it measured, and when did it start?"     no 6. PUS ON THE TONSILS: "Is there pus on the tonsils in the back of your throat?"     no 7. OTHER SYMPTOMS: "Do you have any other symptoms?" (e.g., difficulty breathing, headache, rash)     no 8. PREGNANCY: "Is there any chance you are pregnant?" "When was your last menstrual period?"     n/a  Protocols used: SORE THROAT-A-AH

## 2018-03-28 ENCOUNTER — Ambulatory Visit: Payer: PPO

## 2018-03-28 DIAGNOSIS — R262 Difficulty in walking, not elsewhere classified: Secondary | ICD-10-CM

## 2018-03-28 DIAGNOSIS — M6281 Muscle weakness (generalized): Secondary | ICD-10-CM | POA: Diagnosis not present

## 2018-03-28 NOTE — Therapy (Signed)
[facility] PHYSICAL AND SPORTS MEDICINE [address] [place], [place], [address] Phone: [phone]   Fax:  [phone]  Physical Therapy Treatment  Patient Details  Name: Paula Luna MRN: [id number] Date of Birth: 1925-01-13 Referring Provider: Karleen Hampshirespencer copland MD   Encounter Date: 03/28/2018  PT End of Session - 03/28/18 1358    Visit Number  28    Number of Visits  34    Date for PT Re-Evaluation  04/25/18    Authorization Time Period  7/10    PT Start Time  1350    PT Stop Time  1430    PT Time Calculation (min)  40 min    Equipment Utilized During Treatment  Gait belt    Activity Tolerance  Patient limited by fatigue    Behavior During Therapy  [facility] for tasks assessed/performed       Past Medical History:  Diagnosis Date  . Anemia   . Back pain   . Hypertension 12/25/2013  . Ischemic bowel disease (HCC)    a. 03/2017 abd pain-->internal herina and ischemic jejunum s/p ex lap and small bowel resection.  . Macular degeneration of left eye 01/06/2015  . Osteoporosis   . Persistent atrial fibrillation (HCC)    a. First noted 03/2017;  b. 07/2017 Echo: EF 60-65%, no rwma, mild MR, mildly to mod dil LA; c. 07/2017 48hr Holter: persistent Afib, avg HR of 66 (range 39-140 bpm);  d. CHA2DS2VASc = 4-->Xarelto initiated 07/2017.    Past Surgical History:  Procedure Laterality Date  . CLOSED REDUCTION PROXIMAL HUMERUS FRACTURE Left 2017  . LAPAROSCOPIC SMALL BOWEL RESECTION    . LAPAROTOMY N/A 03/18/2017   Procedure: EXPLORATORY LAPAROTOMY, SMALL BOWEL RESECTION;  Surgeon: Leafy RoPabon, Diego F, MD;  Location: [facility] ORS;  Service: General;  Laterality: N/A;  . TOTAL ABDOMINAL HYSTERECTOMY      There were no vitals filed for this visit.  Subjective Assessment - 03/28/18 1356    Subjective  Pt states that she is feeling tired today. Pt spent previous [place] at the beach and feels that she "needs the therapy". Pt also reports that she has experienced  a calf cramp in her RLE a few times in the past few weeks.     Pertinent History  Small bowel surgery     Limitations  Standing;Walking    Patient Stated Goals  To walk without feeling of falling     Currently in Pain?  No/denies        TREATMENT Therapeutic Exercises Nu Step level 4 x10 minutes to improve LE strength and muscular endurance  Amb 200 ft and 150 ft to improve functional mobility and improve endurance Standing gastroc stretch with forefoot elevated B x4 min Lateral stepping on airex beam with UE support x6 (verbal cues to attempt to decrease UE support and not look down) Tandem amb on airex beam with one UE support x8 Kicking soccer ball to improve dynamic balance and stepping responses x20 (pt required UE support for task today)  Pt demonstrates increased fatigue at end of treatment session     PT Education - 03/28/18 1356    Education provided  Yes    Education Details  Pt educated on proper form and technique for therapeutic exercises.     Person(s) Educated  Patient    Methods  Explanation;Demonstration    Comprehension  Returned demonstration;Verbalized understanding          PT Long Term Goals - 03/28/18  1505      PT LONG TERM GOAL #1   Title  Patient will be independent with HEP focused on decreasing fall risk to continue benefits of therapy after discharge.     Baseline  Dependent with exercise performance; 01/01/2018: moderate cueing for performance. 03/28/18 min-mod cueing required    Time  6    Period  Weeks    Status  On-going      PT LONG TERM GOAL #2   Title  Patient will improve TUG to under 12 sec to indicate singificant decrease in fall risk.     Baseline  20sec; 01/01/2018: 14sec; 02/12/2018; 11.59 sec    Time  8    Period  Weeks    Status  Achieved      PT LONG TERM GOAL #3   Title  Patient will improve her ability to perform to 67m/s to indicate increased safety with walking at community ambulation    Baseline  .75m/s; 01/01/2018:  .85m/s; 02/12/2018: .50m/s; 03/28/18 .94 m/s    Time  8    Period  Weeks    Status  On-going      PT LONG TERM GOAL #4   Title  Patient will improve 5XSTS to under 16 sec to indicate significant improvement LE functional strength and decrease fall risk.    Baseline  30sec; 01/01/2018: 12 sec    Time  8    Period  Weeks    Status  Achieved      PT LONG TERM GOAL #5   Title  Pt will ambulate 522ft in 3 minutes to demonstrate improved endurance with walking into department stores from the parking lot.     Baseline  03/28/18: 350ft    Time  6    Period  Weeks    Status  New            Plan - 03/28/18 1441    Clinical Impression Statement  Pt has improved her from .86 m/s to .94 m/s indicating progress towards long term goal of 50m/s or >. Pt was able to ambulate 350 ft in 3 minutes and required one seated rest break 1 minute into the test indicating decreased endurance. Pt continues to require handheld support throughout therapeutic exercises (soccer kicks, tandem amb on airex beam, calf stretch) indicated impaired balance. Pt also requires frequent rest breaks demonstrating decreased endurance and decreased tolerance to therapeutic activities. Pt will continue to benefit from skilled PT in order to return to prior level of function.    Rehab Potential  Fair    Clinical Impairments Affecting Rehab Potential  (+) family support, decreased coormorbities (-) age    PT Frequency  2x / week    PT Duration  8 weeks    PT Treatment/Interventions  Balance training;Neuromuscular re-education;Gait training;Patient/family education;Therapeutic exercise;Therapeutic activities;Functional mobility training;Stair training;Moist Heat;Aquatic Therapy;Electrical Stimulation;Cryotherapy;Manual techniques    PT Next Visit Plan  Attempt more dynamic balance tasks, ambulation over obstacles, ambulation with head turns, stepping response tasks    PT Home Exercise Plan  See education    Consulted and Agree  with Plan of Care  Patient    Family Member Consulted  --       Patient will benefit from skilled therapeutic intervention in order to improve the following deficits and impairments:  Abnormal gait, Decreased coordination, Decreased endurance, Decreased activity tolerance, Decreased strength, Decreased range of motion, Decreased balance, Difficulty walking  Visit Diagnosis: Muscle weakness (generalized)  Difficulty in walking,  not elsewhere classified     Problem List Patient Active Problem List   Diagnosis Date Noted  . Atrial fibrillation (HCC) 04/26/2017  . Anemia 04/26/2017  . 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 Macular degeneration of left eye 01/06/2015  . Essential hypertension 12/25/2013  . BACK PAIN 08/02/2010    Mellody Dance, SPT  03/29/2018, 9:23 AM   [facility] PHYSICAL AND SPORTS MEDICINE [address], [place], [address] Phone: [phone]   Fax:  [phone]  Name: Paula Luna MRN: [id number] Date of Birth: [birth year]

## 2018-03-29 ENCOUNTER — Other Ambulatory Visit: Payer: Self-pay | Admitting: Family Medicine

## 2018-04-02 ENCOUNTER — Ambulatory Visit: Payer: PPO | Admitting: Family Medicine

## 2018-04-03 DIAGNOSIS — Z961 Presence of intraocular lens: Secondary | ICD-10-CM | POA: Diagnosis not present

## 2018-04-03 DIAGNOSIS — H524 Presbyopia: Secondary | ICD-10-CM | POA: Diagnosis not present

## 2018-04-03 DIAGNOSIS — H5201 Hypermetropia, right eye: Secondary | ICD-10-CM | POA: Diagnosis not present

## 2018-04-03 DIAGNOSIS — H52201 Unspecified astigmatism, right eye: Secondary | ICD-10-CM | POA: Diagnosis not present

## 2018-04-03 DIAGNOSIS — H353124 Nonexudative age-related macular degeneration, left eye, advanced atrophic with subfoveal involvement: Secondary | ICD-10-CM | POA: Diagnosis not present

## 2018-04-03 DIAGNOSIS — H353112 Nonexudative age-related macular degeneration, right eye, intermediate dry stage: Secondary | ICD-10-CM | POA: Diagnosis not present

## 2018-04-05 ENCOUNTER — Ambulatory Visit: Payer: PPO

## 2018-04-05 DIAGNOSIS — M6281 Muscle weakness (generalized): Secondary | ICD-10-CM | POA: Diagnosis not present

## 2018-04-05 DIAGNOSIS — R262 Difficulty in walking, not elsewhere classified: Secondary | ICD-10-CM

## 2018-04-05 NOTE — Therapy (Signed)
[facility]ICINE [address] [place], [address] Phone: [phone]   Fax:  [phone]  Physical Therapy Treatment  Patient Details  Name: Paula Luna MRN: [id number] Date of Birth: Sep 22, 1924 Referring Provider: Karleen Hampshire copland MD   Encounter Date: 04/05/2018  PT End of Session - 04/05/18 1650    Visit Number  29    Number of Visits  34    Date for PT Re-Evaluation  04/25/18    Authorization Type  6/6 FOTO    Authorization Time Period  8/10    PT Start Time  1645    PT Stop Time  1730    PT Time Calculation (min)  45 min    Equipment Utilized During Treatment  Gait belt    Activity Tolerance  Patient tolerated treatment well    Behavior During Therapy  WFL for tasks assessed/performed       Past Medical History:  Diagnosis Date  . Anemia   . Back pain   . Hypertension 12/25/2013  . Ischemic bowel disease (HCC)    a. 03/2017 abd pain-->internal herina and ischemic jejunum s/p ex lap and small bowel resection.  . Macular degeneration of left eye 01/06/2015  . Osteoporosis   . Persistent atrial fibrillation (HCC)    a. First noted 03/2017;  b. 07/2017 Echo: EF 60-65%, no rwma, mild MR, mildly to mod dil LA; c. 07/2017 48hr Holter: persistent Afib, avg HR of 66 (range 39-140 bpm);  d. CHA2DS2VASc = 4-->Xarelto initiated 07/2017.    Past Surgical History:  Procedure Laterality Date  . CLOSED REDUCTION PROXIMAL HUMERUS FRACTURE Left 2017  . LAPAROSCOPIC SMALL BOWEL RESECTION    . LAPAROTOMY N/A 03/18/2017   Procedure: EXPLORATORY LAPAROTOMY, SMALL BOWEL RESECTION;  Surgeon: Leafy Ro, MD;  Location: [facility] ORS;  Service: General;  Laterality: N/A;  . TOTAL ABDOMINAL HYSTERECTOMY      There were no vitals filed for this visit.  Subjective Assessment - 04/05/18 1649    Subjective  Pt states that she is feeling good today and that she has not had calf cramps since previous session. No significant  changes since previous session.     Pertinent History  Small bowel surgery     Limitations  Standing;Walking    Patient Stated Goals  To walk without feeling of falling     Currently in Pain?  No/denies        TREATMENT Therapeutic Exercises Nu Step level 4 x10 minutes to improve LE strength and muscular endurance  Amb 250 ft and 200 ft to improve functional mobility and improve endurance (significant rest break required after) Lateral stepping on 4 balance stones with B UE support x8  Sit to stand with no UE support 10x2 to improve LE power and endurance  Slow high knees on airex with 2 finger support x20 Kicking soccer ball to improve dynamic balance and stepping response x20 (focus on decreasing UE support, last 10 kicks done without UE support)  Pt demonstrates increased fatigue at end of treatment session.     PT Education - 04/05/18 1649    Education provided  Yes    Education Details  Pt educated on proper form and technique for therapeutic exercises.    Person(s) Educated  Patient    Methods  Explanation;Demonstration    Comprehension  Verbalized understanding;Returned demonstration          PT Long Term Goals - 03/28/18 1505  PT LONG TERM GOAL #1   Title  Patient will be independent with HEP focused on decreasing fall risk to continue benefits of therapy after discharge.     Baseline  Dependent with exercise performance; 01/01/2018: moderate cueing for performance. 03/28/18 min-mod cueing required    Time  6    Period  Weeks    Status  On-going      PT LONG TERM GOAL #2   Title  Patient will improve TUG to under 12 sec to indicate singificant decrease in fall risk.     Baseline  20sec; 01/01/2018: 14sec; 02/12/2018; 11.59 sec    Time  8    Period  Weeks    Status  Achieved      PT LONG TERM GOAL #3   Title  Patient will improve her ability to perform 10mWT to 6132m/s to indicate increased safety with walking at community ambulation    Baseline  .76432m/s;  01/01/2018: .5070m/s; 02/12/2018: .6418m/s; 03/28/18 .94 m/s    Time  8    Period  Weeks    Status  On-going      PT LONG TERM GOAL #4   Title  Patient will improve 5XSTS to under 16 sec to indicate significant improvement LE functional strength and decrease fall risk.    Baseline  30sec; 01/01/2018: 12 sec    Time  8    Period  Weeks    Status  Achieved      PT LONG TERM GOAL #5   Title  Pt will ambulate 54700ft in 3 minutes to demonstrate improved endurance with walking into department stores from the parking lot.     Baseline  03/28/18: 34750ft    Time  6    Period  Weeks    Status  New            Plan - 04/05/18 1734    Clinical Impression Statement  Pt demonstrates improved endurance today indicated by ability to ambulate 250 ft prior to needing a rest break followed by 200 ft (total 450 ft today). Pt also demonstrates improved balance as indicated by ability to perform soccer ball kicks with no UE support. Patient requires UE support to perform step ups secondary to poor dynamic balance and fear of falling. Pt will benefit from continued skilled PT in order to return to prior level of function.    Rehab Potential  Fair    Clinical Impairments Affecting Rehab Potential  (+) family support, decreased coormorbities (-) age    PT Frequency  2x / week    PT Duration  8 weeks    PT Treatment/Interventions  Balance training;Neuromuscular re-education;Gait training;Patient/family education;Therapeutic exercise;Therapeutic activities;Functional mobility training;Stair training;Moist Heat;Aquatic Therapy;Electrical Stimulation;Cryotherapy;Manual techniques    PT Next Visit Plan  Attempt more dynamic balance tasks, ambulation over obstacles, ambulation with head turns, stepping response tasks    PT Home Exercise Plan  See education    Consulted and Agree with Plan of Care  Patient       Patient will benefit from skilled therapeutic intervention in order to improve the following deficits and  impairments:  Abnormal gait, Decreased coordination, Decreased endurance, Decreased activity tolerance, Decreased strength, Decreased range of motion, Decreased balance, Difficulty walking  Visit Diagnosis: Muscle weakness (generalized)  Difficulty in walking, not elsewhere classified     Problem List Patient Active Problem List   Diagnosis Date Noted  . Atrial fibrillation (HCC) 04/26/2017  . Anemia 04/26/2017  . 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 Macular degeneration of left eye 01/06/2015  . Essential hypertension 12/25/2013  . BACK PAIN 08/02/2010    Mellody Dance, SPT 04/05/2018, 5:47 PM  [facility] PHYSICAL AND SPORTS MEDICINE [address] [place], [address] Phone: [phone]   Fax:  [phone]  Name: RENNA KILMER MRN: [id number] Date of Birth: May 31, 1925

## 2018-04-11 ENCOUNTER — Ambulatory Visit: Payer: PPO

## 2018-04-17 ENCOUNTER — Ambulatory Visit: Payer: PPO

## 2018-04-24 ENCOUNTER — Ambulatory Visit: Payer: PPO | Attending: Family Medicine

## 2018-04-24 DIAGNOSIS — R262 Difficulty in walking, not elsewhere classified: Secondary | ICD-10-CM | POA: Diagnosis not present

## 2018-04-24 DIAGNOSIS — M6281 Muscle weakness (generalized): Secondary | ICD-10-CM | POA: Insufficient documentation

## 2018-04-24 NOTE — Therapy (Signed)
This entire session was performed under the direct supervision of a liscensed physical therapist. 12:11 PM, 04/24/18 Rosamaria Lints, PT, DPT Physical Therapist - [place] [phone] (Office)     [facility] PHYSICAL AND SPORTS MEDICINE [address] [place], [address] Phone: [phone]   Fax:  [phone]  Physical Therapy Treatment  Patient Details  Name: Paula Luna MRN: [id number] Date of Birth: 11-12-1924 Referring Provider: Karleen Hampshire copland MD   Encounter Date: 04/24/2018  PT End of Session - 04/24/18 0857    Visit Number  30    Number of Visits  34    Date for PT Re-Evaluation  04/25/18    Authorization Type  FOTO    Authorization Time Period  9/10    PT Start Time  0900    PT Stop Time  0945    PT Time Calculation (min)  45 min    Equipment Utilized During Treatment  Gait belt    Activity Tolerance  Patient tolerated treatment well    Behavior During Therapy  [facility] for tasks assessed/performed       Past Medical History:  Diagnosis Date  . Anemia   . Back pain   . Hypertension 12/25/2013  . Ischemic bowel disease (HCC)    a. 03/2017 abd pain-->internal herina and ischemic jejunum s/p ex lap and small bowel resection.  . Macular degeneration of left eye 01/06/2015  . Osteoporosis   . Persistent atrial fibrillation (HCC)    a. First noted 03/2017;  b. 07/2017 Echo: EF 60-65%, no rwma, mild MR, mildly to mod dil LA; c. 07/2017 48hr Holter: persistent Afib, avg HR of 66 (range 39-140 bpm);  d. CHA2DS2VASc = 4-->Xarelto initiated 07/2017.    Past Surgical History:  Procedure Laterality Date  . CLOSED REDUCTION PROXIMAL HUMERUS FRACTURE Left 2017  . LAPAROSCOPIC SMALL BOWEL RESECTION    . LAPAROTOMY N/A 03/18/2017   Procedure: EXPLORATORY LAPAROTOMY, SMALL BOWEL RESECTION;  Surgeon: Leafy Ro, MD;  Location: [facility] ORS;  Service: General;  Laterality: N/A;  . TOTAL ABDOMINAL HYSTERECTOMY      There were no  vitals filed for this visit.  Subjective Assessment - 04/24/18 0857    Subjective  Pt states that she was not feeling well yesterday but that today she is feeling much better. Pt reportedly did a lot of walking over the past week while visiting family in [place].     Pertinent History  Small bowel surgery     Limitations  Standing;Walking    Patient Stated Goals  To walk without feeling of falling     Currently in Pain?  No/denies         TREATMENT Therapeutic Exercises Nu step level 4 x10 min to increase LE strength and muscular endurance Amb 27ft with supervision, 100 ft, 80ft and then 100 ft with significant rest breaks in between  Fwd/bkwd stepping with black tubing around waist x5 passes both directions Lateral stepping on airex beam with focus on forefoot only contact with beam and keeping heels off of ground to increase calf musculature activation x5  Tandem amb on airex beam with one UE support x5  Heel raises on airex pad 1 finger for support x20  Patient demonstrates increased fatigue at end of treatment session                      PT Education - 04/24/18 0857    Education provided  Yes  Education Details  Pt educated on proper form and technique for therapeutic exercises.    Person(s) Educated  Patient    Methods  Explanation;Demonstration    Comprehension  Verbalized understanding;Returned demonstration          PT Long Term Goals - 03/28/18 1505      PT LONG TERM GOAL #1   Title  Patient will be independent with HEP focused on decreasing fall risk to continue benefits of therapy after discharge.     Baseline  Dependent with exercise performance; 01/01/2018: moderate cueing for performance. 03/28/18 min-mod cueing required    Time  6    Period  Weeks    Status  On-going      PT LONG TERM GOAL #2   Title  Patient will improve TUG to under 12 sec to indicate singificant decrease in fall risk.     Baseline  20sec; 01/01/2018: 14sec;  02/12/2018; 11.59 sec    Time  8    Period  Weeks    Status  Achieved      PT LONG TERM GOAL #3   Title  Patient will improve her ability to perform 10mWT to 5661m/s to indicate increased safety with walking at community ambulation    Baseline  .47461m/s; 01/01/2018: .6921m/s; 02/12/2018: .6844m/s; 03/28/18 .94 m/s    Time  8    Period  Weeks    Status  On-going      PT LONG TERM GOAL #4   Title  Patient will improve 5XSTS to under 16 sec to indicate significant improvement LE functional strength and decrease fall risk.    Baseline  30sec; 01/01/2018: 12 sec    Time  8    Period  Weeks    Status  Achieved      PT LONG TERM GOAL #5   Title  Pt will ambulate 54500ft in 3 minutes to demonstrate improved endurance with walking into department stores from the parking lot.     Baseline  03/28/18: 36150ft    Time  6    Period  Weeks    Status  New            Plan - 04/24/18 0945    Clinical Impression Statement  Compared to previous session, pt demonstrates decreased tolerance to ambulation indicated by need for rest break after ambulating 200 ft (22450ft previous session). Pt was able to ambulate 58200ft total throuhgout the entire treatment session however, she required more frequent rest breaks (max distance amb 28200ft at one time). Pt required CGA for lateral stepping with black tubing around waist due to decreased stability and also demonstrates need for UE support for balance tasks on airex beam. Pt demonstrates fear of falling indicated by occasional grabbing for objects around gym while ambulating. Pt will benefit from continued skilled PT in order to return to prior level of function.    Rehab Potential  Fair    Clinical Impairments Affecting Rehab Potential  (+) family support, decreased coormorbities (-) age    PT Frequency  1x / week    PT Duration  8 weeks    PT Treatment/Interventions  Balance training;Neuromuscular re-education;Gait training;Patient/family education;Therapeutic  exercise;Therapeutic activities;Functional mobility training;Stair training;Moist Heat;Aquatic Therapy;Electrical Stimulation;Cryotherapy;Manual techniques    PT Next Visit Plan  Attempt more dynamic balance tasks, ambulation over obstacles, ambulation with head turns, stepping response tasks    PT Home Exercise Plan  See education    Consulted and Agree with Plan of Care  Patient  Patient will benefit from skilled therapeutic intervention in order to improve the following deficits and impairments:  Abnormal gait, Decreased coordination, Decreased endurance, Decreased activity tolerance, Decreased strength, Decreased range of motion, Decreased balance, Difficulty walking  Visit Diagnosis: Muscle weakness (generalized)  Difficulty in walking, not elsewhere classified     Problem List Patient Active Problem List   Diagnosis Date Noted  . Atrial fibrillation (HCC) 04/26/2017  . Anemia 04/26/2017  . 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 Macular degeneration of left eye 01/06/2015  . Essential hypertension 12/25/2013  . BACK PAIN 08/02/2010    Mellody DanceKaren Deneisha Dade, SPT 04/24/2018, 9:53 AM  [facility] [address] [place], [place], [address] Phone: [phone]   Fax:  [phone]  Name: Paula Luna MRN: [id number] Date of Birth: [birth year]

## 2018-05-01 ENCOUNTER — Ambulatory Visit: Payer: PPO

## 2018-05-01 ENCOUNTER — Telehealth: Payer: Self-pay | Admitting: Family Medicine

## 2018-05-01 DIAGNOSIS — M6281 Muscle weakness (generalized): Secondary | ICD-10-CM | POA: Diagnosis not present

## 2018-05-01 DIAGNOSIS — R262 Difficulty in walking, not elsewhere classified: Secondary | ICD-10-CM

## 2018-05-01 MED ORDER — LINACLOTIDE 145 MCG PO CAPS: 145.0000 ug | ORAL_CAPSULE | Freq: Every day | ORAL | 3 refills | Status: DC

## 2018-05-01 NOTE — Telephone Encounter (Signed)
Copied from CRM [id number]. Topic: Quick Communication - See Telephone Encounter >> May 01, 2018 10:24 AM Oneal GroutSebastian, Jennifer S wrote: CRM for notification. See Telephone encounter for: 05/01/18. Patient daughter calling regarding docusate sodium (COLACE) 100 MG capsule, patient gets worried if she does not have a BM everyday. Should she try something else, daughter was wondering about linzess or something else. Please advise

## 2018-05-01 NOTE — Telephone Encounter (Signed)
[name] notified as instructed by telephone.  Linzess prescription sent to [facility] [address] as instructed by Dr. Patsy Lageropland.

## 2018-05-01 NOTE — Telephone Encounter (Signed)
Docusate as a stool softener is fine.  Linzess is reasonable to try. 145 micrograms 1 po daily 30 minutes before breakfast #30, 3  ref

## 2018-05-01 NOTE — Therapy (Signed)
[facility] PHYSICAL [facility] SPORTS MEDICINE [address] [place], [address] Phone: [phone]   Fax:  [phone]  Physical Therapy Treatment  Patient Details  Name: Paula Luna MRN: [id number] Date of Birth: July 23, 1925 Referring Provider: Karleen Hampshire copland MD   Encounter Date: 05/01/2018  PT End of Session - 05/01/18 1040    Visit Number  31    Number of Visits  34    Date for PT Re-Evaluation  06/12/18    Authorization Type  FOTO    Authorization Time Period  10/10    PT Start Time  0905    PT Stop Time  0945    PT Time Calculation (min)  40 min    Equipment Utilized During Treatment  Gait belt    Activity Tolerance  Patient tolerated treatment well    Behavior During Therapy  [facility] for tasks assessed/performed       Past Medical History:  Diagnosis Date  . Anemia   . Back pain   . Hypertension 12/25/2013  . Ischemic bowel disease (HCC)    a. 03/2017 abd pain-->internal herina and ischemic jejunum s/p ex lap and small bowel resection.  . Macular degeneration of left eye 01/06/2015  . Osteoporosis   . Persistent atrial fibrillation (HCC)    a. First noted 03/2017;  b. 07/2017 Echo: EF 60-65%, no rwma, mild MR, mildly to mod dil LA; c. 07/2017 48hr Holter: persistent Afib, avg HR of 66 (range 39-140 bpm);  d. CHA2DS2VASc = 4-->Xarelto initiated 07/2017.    Past Surgical History:  Procedure Laterality Date  . CLOSED REDUCTION PROXIMAL HUMERUS FRACTURE Left 2017  . LAPAROSCOPIC SMALL BOWEL RESECTION    . LAPAROTOMY N/A 03/18/2017   Procedure: EXPLORATORY LAPAROTOMY, SMALL BOWEL RESECTION;  Surgeon: Leafy Ro, MD;  Location: [facility] ORS;  Service: General;  Laterality: N/A;  . TOTAL ABDOMINAL HYSTERECTOMY      There were no vitals filed for this visit.  Subjective Assessment - 05/01/18 1037    Subjective  Pt states that she is not feeling well today and that she ate her breakfast too quickly this morning which caused her to  vomit. She also states that she forgot to bring her glasses today and that this makes walking more difficult.     Pertinent History  Small bowel surgery     Limitations  Standing;Walking    Patient Stated Goals  To walk without feeling of falling     Currently in Pain?  No/denies        TREATMENT Therapeutic Exercises Nu Step level 4 x5 minutes to improve LE strength and muscular endurance Amb 276ft, 163ft, 20 ft with significant rest breaks in between Slow high knees on airex pad with one UE support to improve dynamic balance Lateral amb on airex beam with focus on maintaining heels off of ground to incr activation of calf musculature -- x6 passes Heel raises with 1 finger used for support 3x10  Significant rest breaks required during today's session. Pt demonstrated increased fatigue at end of treatment session.     PT Education - 05/01/18 1040    Education provided  Yes    Education Details  Pt educated on proper form and technique for therapeutic exercises.     Person(s) Educated  Patient    Methods  Explanation;Demonstration    Comprehension  Verbalized understanding;Returned demonstration          PT Long Term Goals - 05/01/18 1100  PT LONG TERM GOAL #1   Title  Patient will be independent with HEP focused on decreasing fall risk to continue benefits of therapy after discharge.     Baseline  Dependent with exercise performance; 01/01/2018: moderate cueing for performance. 03/28/18 min-mod cueing required; 04/30/18: min cueing required    Time  6    Period  Weeks    Status  On-going      PT LONG TERM GOAL #2   Title  Patient will improve TUG to under 12 sec to indicate singificant decrease in fall risk.     Baseline  20sec; 01/01/2018: 14sec; 02/12/2018; 11.59 sec    Time  8    Period  Weeks    Status  Achieved      PT LONG TERM GOAL #3   Title  Patient will improve her ability to perform 10mWT to 2721m/s to indicate increased safety with walking at community  ambulation    Baseline  .72421m/s; 01/01/2018: .1950m/s; 02/12/2018: .4365m/s; 03/28/18 .94 m/s; 8/19/9: testing of goals deferred to next session     Time  8    Period  Weeks    Status  On-going      PT LONG TERM GOAL #4   Title  Patient will improve 5XSTS to under 16 sec to indicate significant improvement LE functional strength and decrease fall risk.    Baseline  30sec; 01/01/2018: 12 sec    Time  8    Period  Weeks    Status  Achieved      PT LONG TERM GOAL #5   Title  Pt will ambulate 52900ft in 3 minutes to demonstrate improved endurance with walking into department stores from the parking lot.     Baseline  03/28/18: 32650ft; 04/30/18: testing of goals deferred to next session    Time  6    Period  Weeks    Status  New            Plan - 05/01/18 1042    Clinical Impression Statement  Due to patient not feeling well today and increased fatigue, assessment of goals was deferred to next session. In previous sessions, pt has been making progress towards short and long term goals and has been improving walking tolerance. Howvever, pt continues to require frequent rest breaks, requires UE support during most therapeutic exercises, and reports fear of falling. Therefore, pt will continue to benefit from skilled PT in order to return to prior level of function;    Rehab Potential  Fair    Clinical Impairments Affecting Rehab Potential  (+) family support, decreased coormorbities (-) age    PT Frequency  1x / week    PT Duration  8 weeks    PT Treatment/Interventions  Balance training;Neuromuscular re-education;Gait training;Patient/family education;Therapeutic exercise;Therapeutic activities;Functional mobility training;Stair training;Moist Heat;Aquatic Therapy;Electrical Stimulation;Cryotherapy;Manual techniques    PT Next Visit Plan  Attempt more dynamic balance tasks, ambulation over obstacles, ambulation with head turns, stepping response tasks    PT Home Exercise Plan  See education     Consulted and Agree with Plan of Care  Patient       Patient will benefit from skilled therapeutic intervention in order to improve the following deficits and impairments:  Abnormal gait, Decreased coordination, Decreased endurance, Decreased activity tolerance, Decreased strength, Decreased range of motion, Decreased balance, Difficulty walking  Visit Diagnosis: Muscle weakness (generalized)  Difficulty in walking, not elsewhere classified     Problem List Patient Active Problem List  Diagnosis Date Noted  . Atrial fibrillation (HCC) 04/26/2017  . Anemia 04/26/2017  . 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 Macular degeneration of left eye 01/06/2015  . Essential hypertension 12/25/2013  . BACK PAIN 08/02/2010    Mellody DanceKaren Yannis Gumbs, SPT 05/01/2018, 12:38 PM  [place] [facility] [address] [place], [place], [address] Phone: [phone]   Fax:  [phone]  Name: Paula Luna MRN: [id number] Date of Birth: [birth year]

## 2018-05-08 ENCOUNTER — Ambulatory Visit: Payer: PPO

## 2018-05-08 DIAGNOSIS — M6281 Muscle weakness (generalized): Secondary | ICD-10-CM

## 2018-05-08 DIAGNOSIS — R262 Difficulty in walking, not elsewhere classified: Secondary | ICD-10-CM

## 2018-05-09 NOTE — Therapy (Signed)
[facility] PHYSICAL AND SPORTS MEDICINE [address], [place], [address] Phone: [phone]   Fax:  [phone]  Physical Therapy Treatment  Patient Details  Name: Paula Luna MRN: [id number] Date of Birth: 1924/10/01 Referring Provider: Karleen Hampshire copland MD   Encounter Date: 05/08/2018  PT End of Session - 05/09/18 0816    Visit Number  32    Number of Visits  42    Date for PT Re-Evaluation  06/12/18    Authorization Type  FOTO    Authorization Time Period  1/10    PT Start Time  1830    PT Stop Time  1915    PT Time Calculation (min)  45 min    Equipment Utilized During Treatment  Gait belt    Activity Tolerance  Patient tolerated treatment well    Behavior During Therapy  [facility] for tasks assessed/performed       Past Medical History:  Diagnosis Date  . Anemia   . Back pain   . Hypertension 12/25/2013  . Ischemic bowel disease (HCC)    a. 03/2017 abd pain-->internal herina and ischemic jejunum s/p ex lap and small bowel resection.  . Macular degeneration of left eye 01/06/2015  . Osteoporosis   . Persistent atrial fibrillation (HCC)    a. First noted 03/2017;  b. 07/2017 Echo: EF 60-65%, no rwma, mild MR, mildly to mod dil LA; c. 07/2017 48hr Holter: persistent Afib, avg HR of 66 (range 39-140 bpm);  d. CHA2DS2VASc = 4-->Xarelto initiated 07/2017.    Past Surgical History:  Procedure Laterality Date  . CLOSED REDUCTION PROXIMAL HUMERUS FRACTURE Left 2017  . LAPAROSCOPIC SMALL BOWEL RESECTION    . LAPAROTOMY N/A 03/18/2017   Procedure: EXPLORATORY LAPAROTOMY, SMALL BOWEL RESECTION;  Surgeon: Leafy Ro, MD;  Location: [facility] ORS;  Service: General;  Laterality: N/A;  . TOTAL ABDOMINAL HYSTERECTOMY      There were no vitals filed for this visit.  Subjective Assessment - 05/08/18 1859    Subjective  Patient states she is feeling alright today. Patient reports slight stomach ache and states she is tired from being active  today washing dishes and doing the laundry at home.     Pertinent History  Small bowel surgery     Limitations  Standing;Walking    Patient Stated Goals  To walk without feeling of falling     Currently in Pain?  No/denies       TREATMENT Therapeutic Exercises Nu Step level 4 x to improve LE strength and muscular endurance Amb 150 x 120 x 179ft with significant rest breaks in between Heel raises with 1 finger used for support 2x10 Side stepping up and over on airex - x 15 B Hip abduction in standing with UE - 2 x 10  Standing feet at [place] - x 20 on airex pad   Significant rest breaks required during today's session. Pt demonstrated increased fatigue at end of treatment session.   PT Education - 05/09/18 0816    Education provided  Yes    Education Details  form/technique with exercise    Person(s) Educated  Patient    Methods  Explanation;Demonstration    Comprehension  Verbalized understanding;Returned demonstration          PT Long Term Goals - 05/01/18 1100      PT LONG TERM GOAL #1   Title  Patient will be independent with HEP focused on decreasing fall risk to continue benefits  of therapy after discharge.     Baseline  Dependent with exercise performance; 01/01/2018: moderate cueing for performance. 03/28/18 min-mod cueing required; 04/30/18: min cueing required    Time  6    Period  Weeks    Status  On-going      PT LONG TERM GOAL #2   Title  Patient will improve TUG to under 12 sec to indicate singificant decrease in fall risk.     Baseline  20sec; 01/01/2018: 14sec; 02/12/2018; 11.59 sec    Time  8    Period  Weeks    Status  Achieved      PT LONG TERM GOAL #3   Title  Patient will improve her ability to perform 10mWT to 4978m/s to indicate increased safety with walking at community ambulation    Baseline  .56478m/s; 01/01/2018: .6176m/s; 02/12/2018: .4163m/s; 03/28/18 .94 m/s; 8/19/9: testing of goals deferred to next session     Time  8    Period  Weeks     Status  On-going      PT LONG TERM GOAL #4   Title  Patient will improve 5XSTS to under 16 sec to indicate significant improvement LE functional strength and decrease fall risk.    Baseline  30sec; 01/01/2018: 12 sec    Time  8    Period  Weeks    Status  Achieved      PT LONG TERM GOAL #5   Title  Pt will ambulate 5200ft in 3 minutes to demonstrate improved endurance with walking into department stores from the parking lot.     Baseline  03/28/18: 33550ft; 04/30/18: testing of goals deferred to next session    Time  6    Period  Weeks    Status  New            Plan - 05/09/18 [id number]    Clinical Impression Statement  Patient demonstrates increased endurance with performing exercises and requires less sitting rest breaks compared to previous sessoins. Although patient is improving she continues to demonstrate decreased endurance with ambulation requiring sitting rest breaks after after 14450ft of walking. Patient will benefit from further skilled therapy to return to prior level of function.      Rehab Potential  Fair    Clinical Impairments Affecting Rehab Potential  (+) family support, decreased coormorbities (-) age    PT Frequency  1x / week    PT Duration  8 weeks    PT Treatment/Interventions  Balance training;Neuromuscular re-education;Gait training;Patient/family education;Therapeutic exercise;Therapeutic activities;Functional mobility training;Stair training;Moist Heat;Aquatic Therapy;Electrical Stimulation;Cryotherapy;Manual techniques    PT Next Visit Plan  Attempt more dynamic balance tasks, ambulation over obstacles, ambulation with head turns, stepping response tasks    PT Home Exercise Plan  See education    Consulted and Agree with Plan of Care  Patient       Patient will benefit from skilled therapeutic intervention in order to improve the following deficits and impairments:  Abnormal gait, Decreased coordination, Decreased endurance, Decreased activity tolerance, Decreased  strength, Decreased range of motion, Decreased balance, Difficulty walking  Visit Diagnosis: Difficulty in walking, not elsewhere classified  Muscle weakness (generalized)     Problem List Patient Active Problem List   Diagnosis Date Noted  . Atrial fibrillation (HCC) 04/26/2017  . Anemia 04/26/2017  . 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 Macular degeneration of left eye 01/06/2015  . Essential hypertension 12/25/2013  . BACK  PAIN 08/02/2010    Myrene Galas, PT DPT 05/09/2018, 10:14 AM  [place] [facility] PHYSICAL AND SPORTS MEDICINE [address] [place], [address] Phone: [phone]   Fax:  [phone]  Name: REFUGIA LANEVE MRN: [id number] Date of Birth: 10/31/1924

## 2018-05-18 ENCOUNTER — Telehealth: Payer: Self-pay | Admitting: *Deleted

## 2018-05-18 NOTE — Telephone Encounter (Signed)
Copied from CRM [id number]. Topic: General - Other >> May 18, 2018 11:21 AM Paula Luna wrote: Reason for CRM: Pt daughter Paula Luna stated pt can not afford linaclotide (LINZESS) 145 MCG CAPS capsule but will continue to take docusate sodium (COLACE) 100 MG capsule.

## 2018-05-19 NOTE — Telephone Encounter (Signed)
noted 

## 2018-05-21 ENCOUNTER — Ambulatory Visit: Payer: PPO

## 2018-05-24 ENCOUNTER — Encounter: Payer: Self-pay | Admitting: *Deleted

## 2018-05-27 NOTE — Progress Notes (Signed)
Dr. Karleen HampshireSpencer T. Azekiel Cremer, MD, CAQ Sports Medicine Primary Care and Sports Medicine [address] [place], [id number] Phone: [phone] Fax: [phone]  05/28/2018  Patient: Paula CornsRoxanne D Manzella, MRN: [id number], DOB: 12-Oct-1924, 84 y.o.  Primary Physician:  Hannah Beatopland, Tangy Drozdowski, MD   Chief Complaint  Patient presents with  . ears clogged up    difficulty hearing   Subjective:   [name] is a 84 y.o. very pleasant female patient who presents with the following:  Ears blocked. A couple of months. Subjectively feels blocked - wondered if had a cerumen impaction.   O/w she is doing well. Not working at [employer] anymore.  Some itchy eyes and mild runny nose  Seeing Dr. Okey DupreEnd on Wednesday.     Past Medical History, Surgical History, Social History, Family History, Problem List, Medications, and Allergies have been reviewed and updated if relevant.  Patient Active Problem List   Diagnosis Date Noted  . Atrial fibrillation (HCC) 04/26/2017  . Anemia 04/26/2017  . 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 Macular degeneration of left eye 01/06/2015  . Essential hypertension 12/25/2013  . BACK PAIN 08/02/2010    Past Medical History:  Diagnosis Date  . Anemia   . Back pain   . Hypertension 12/25/2013  . Ischemic bowel disease (HCC)    a. 03/2017 abd pain-->internal herina and ischemic jejunum s/p ex lap and small bowel resection.  . Macular degeneration of left eye 01/06/2015  . Osteoporosis   . Persistent atrial fibrillation (HCC)    a. First noted 03/2017;  b. 07/2017 Echo: EF 60-65%, no rwma, mild MR, mildly to mod dil LA; c. 07/2017 48hr Holter: persistent Afib, avg HR of 66 (range 39-140 bpm);  d. CHA2DS2VASc = 4-->Xarelto initiated 07/2017.    Past Surgical History:  Procedure Laterality Date  . CLOSED REDUCTION PROXIMAL HUMERUS FRACTURE Left 2017  . LAPAROSCOPIC SMALL BOWEL RESECTION    . LAPAROTOMY N/A 03/18/2017   Procedure: EXPLORATORY LAPAROTOMY, SMALL BOWEL RESECTION;  Surgeon: Leafy RoPabon, Diego F, MD;  Location: [facility] ORS;  Service: General;  Laterality: N/A;  . TOTAL ABDOMINAL HYSTERECTOMY      Social History   Socioeconomic History  . Marital status: Married    Spouse name: Not on file  . Number of children: Not on file  . Years of education: Not on file  . Highest education level: Not on file  Occupational History  . Occupation: MusicianGreeter    Employer: [email or web address]    Comment: 3 days a week  Social Needs  . Financial resource strain: Not on file  . Food insecurity:    Worry: Not on file    Inability: Not on file  . Transportation needs:    Medical: Not on file    Non-medical: Not on file  Tobacco Use  . Smoking status: Former Games developermoker  . Smokeless tobacco: Never Used  Substance and Sexual Activity  . Alcohol use: No  . Drug use: No  . Sexual activity: Never  Lifestyle  . Physical activity:    Days per week: Not on file    Minutes per session: Not on file  . Stress: Not on file  Relationships  . Social connections:    Talks on phone: Not on file    Gets together: Not on file    Attends religious service: Not on file    Active member of club or organization: Not on file  Attends meetings of clubs or organizations: Not on file    Relationship status: Not on file  . Intimate partner violence:    Fear of current or ex partner: Not on file    Emotionally abused: Not on file    Physically abused: Not on file    Forced sexual activity: Not on file  Other Topics Concern  . Not on file  Social History Narrative   Widowed after 64 years   Working some at [employer] in [employer] with daughter    Family History  Problem Relation Age of Onset  . Diabetes Mother   . Cancer Father        jaw    Allergies  Allergen Reactions  . Latex Rash  . Tetanus Toxoid Anaphylaxis    REACTION: anaphylaxis  . Alendronate Sodium     REACTION: passed out    Medication list  reviewed and updated in full in [facility].   GEN: No acute illnesses, no fevers, chills. GI: No n/v/d, eating normally Pulm: No SOB Interactive and getting along well at home.  Otherwise, ROS is as per the HPI.  Objective:   BP 122/70   Pulse 80   Temp 97.8 F (36.6 C) (Oral)   Ht 4\' 11"  (1.499 m)   Wt 132 lb 8 oz (60.1 kg)   SpO2 97%   BMI 26.76 kg/m   GEN: WDWN, NAD, Non-toxic, A & O x 3 HEENT: Atraumatic, Normocephalic. Neck supple. No masses, No LAD. Ears and Nose: No external deformity. TM with some fluid B CV: RRR, No M/G/R. No JVD. No thrill. No extra heart sounds. PULM: CTA B, no wheezes, crackles, rhonchi. No retractions. No resp. distress. No accessory muscle use. EXTR: No c/c/e NEURO Normal gait.  PSYCH: Normally interactive. Conversant. Not depressed or anxious appearing.  Calm demeanor.   Laboratory and Imaging Data:  Assessment and Plan:   ETD (Eustachian tube dysfunction), bilateral  Likely from AR No cerumen  Patient Instructions  If not getting better in 7-10 days, add in some over the counter allergy medication.   I would use Allegra 180 mg once a day - this is nondrowsy. (get the generic form)    Follow-up: No follow-ups on file.  Meds ordered this encounter  Medications  . fluticasone (FLONASE) 50 MCG/ACT nasal spray    Sig: Place 2 sprays into both nostrils daily.    Dispense:  16 g    Refill:  3   Signed,  Mikiala Fugett T. Mayia Megill, MD   Allergies as of 05/28/2018      Reactions   Latex Rash   Tetanus Toxoid Anaphylaxis   REACTION: anaphylaxis   Alendronate Sodium    REACTION: passed out      Medication List        Accurate as of 05/28/18  1:47 PM. Always use your most recent med list.          docusate sodium 100 MG capsule Commonly known as:  COLACE Take 100 mg by mouth daily.   fluticasone 50 MCG/ACT nasal spray Commonly known as:  FLONASE Place 2 sprays into both nostrils daily.   ICAPS Caps Take 1 capsule  by mouth daily.   lisinopril 10 MG tablet Commonly known as:  PRINIVIL,ZESTRIL TAKE 1 TABLET BY MOUTH ONCE DAILY   NON FORMULARY Vitamin D 50 mcg, take one daily by mouth   rivaroxaban 20 MG Tabs tablet Commonly known as:  Carlena Hurl  Take 1 tablet (20 mg total) by mouth daily with supper.

## 2018-05-28 ENCOUNTER — Ambulatory Visit: Payer: PPO | Attending: Family Medicine

## 2018-05-28 ENCOUNTER — Ambulatory Visit (INDEPENDENT_AMBULATORY_CARE_PROVIDER_SITE_OTHER): Payer: PPO | Admitting: Family Medicine

## 2018-05-28 ENCOUNTER — Encounter: Payer: Self-pay | Admitting: Family Medicine

## 2018-05-28 VITALS — BP 122/70 | HR 80 | Temp 97.8°F | Ht 59.0 in | Wt 132.5 lb

## 2018-05-28 DIAGNOSIS — R262 Difficulty in walking, not elsewhere classified: Secondary | ICD-10-CM | POA: Diagnosis not present

## 2018-05-28 DIAGNOSIS — M6281 Muscle weakness (generalized): Secondary | ICD-10-CM | POA: Diagnosis not present

## 2018-05-28 DIAGNOSIS — H6983 Other specified disorders of Eustachian tube, bilateral: Secondary | ICD-10-CM | POA: Diagnosis not present

## 2018-05-28 MED ORDER — FLUTICASONE PROPIONATE 50 MCG/ACT NA SUSP: 2.0000 | Freq: Every day | NASAL | 3 refills | Status: DC

## 2018-05-28 NOTE — Therapy (Signed)
[facility] [address] [place], [address] Phone: [phone]   Fax:  [phone]  Physical Therapy Treatment  Patient Details  Name: Paula Luna MRN: [id number] Date of Birth: 03-14-1925 Referring Provider: Karleen Hampshire copland MD   Encounter Date: 05/28/2018  PT End of Session - 05/28/18 1724    Visit Number  33    Number of Visits  42    Date for PT Re-Evaluation  06/12/18    Authorization Type  FOTO    Authorization Time Period  2/10    PT Start Time  1715    PT Stop Time  1800    PT Time Calculation (min)  45 min    Equipment Utilized During Treatment  Gait belt    Activity Tolerance  Patient tolerated treatment well    Behavior During Therapy  [facility] for tasks assessed/performed       Past Medical History:  Diagnosis Date  . Anemia   . Back pain   . Hypertension 12/25/2013  . Ischemic bowel disease (HCC)    a. 03/2017 abd pain-->internal herina and ischemic jejunum s/p ex lap and small bowel resection.  . Macular degeneration of left eye 01/06/2015  . Osteoporosis   . Persistent atrial fibrillation (HCC)    a. First noted 03/2017;  b. 07/2017 Echo: EF 60-65%, no rwma, mild MR, mildly to mod dil LA; c. 07/2017 48hr Holter: persistent Afib, avg HR of 66 (range 39-140 bpm);  d. CHA2DS2VASc = 4-->Xarelto initiated 07/2017.    Past Surgical History:  Procedure Laterality Date  . CLOSED REDUCTION PROXIMAL HUMERUS FRACTURE Left 2017  . LAPAROSCOPIC SMALL BOWEL RESECTION    . LAPAROTOMY N/A 03/18/2017   Procedure: EXPLORATORY LAPAROTOMY, SMALL BOWEL RESECTION;  Surgeon: Leafy Ro, MD;  Location: [facility] ORS;  Service: General;  Laterality: N/A;  . TOTAL ABDOMINAL HYSTERECTOMY      There were no vitals filed for this visit.  Subjective Assessment - 05/28/18 1722    Subjective  Patient reports she has been walking for exercise the past two weeks. Patient states she has been activities around the  house.     Pertinent History  Small bowel surgery     Limitations  Standing;Walking    Patient Stated Goals  To walk without feeling of falling     Currently in Pain?  No/denies         TREATMENT Therapeutic Exercises Nu Step level 4 x to improve LE strength and muscular endurance Amb 266ft with significant rest breaks in between Side stepping up and over 6" - x 15 Hip abduction in standing with UE - x 15 hip machine 25# Heel raises with 1 finger used for support 2x10   Pt demonstrated increased fatigue at end of treatment session.    PT Education - 05/28/18 1723    Education provided  Yes    Education Details  form/technique with exercise    Person(s) Educated  Patient    Methods  Explanation;Demonstration    Comprehension  Verbalized understanding;Returned demonstration          PT Long Term Goals - 05/01/18 1100      PT LONG TERM GOAL #1   Title  Patient will be independent with HEP focused on decreasing fall risk to continue benefits of therapy after discharge.     Baseline  Dependent with exercise performance; 01/01/2018: moderate cueing for performance. 03/28/18 min-mod cueing required; 04/30/18: min cueing required  Time  6    Period  Weeks    Status  On-going      PT LONG TERM GOAL #2   Title  Patient will improve TUG to under 12 sec to indicate singificant decrease in fall risk.     Baseline  20sec; 01/01/2018: 14sec; 02/12/2018; 11.59 sec    Time  8    Period  Weeks    Status  Achieved      PT LONG TERM GOAL #3   Title  Patient will improve her ability to perform to 63m/s to indicate increased safety with walking at community ambulation    Baseline  .32m/s; 01/01/2018: .14m/s; 02/12/2018: .58m/s; 03/28/18 .94 m/s; 8/19/9: testing of goals deferred to next session     Time  8    Period  Weeks    Status  On-going      PT LONG TERM GOAL #4   Title  Patient will improve 5XSTS to under 16 sec to indicate significant improvement LE functional  strength and decrease fall risk.    Baseline  30sec; 01/01/2018: 12 sec    Time  8    Period  Weeks    Status  Achieved      PT LONG TERM GOAL #5   Title  Pt will ambulate 570ft in 3 minutes to demonstrate improved endurance with walking into department stores from the parking lot.     Baseline  03/28/18: 351ft; 04/30/18: testing of goals deferred to next session    Time  6    Period  Weeks    Status  New            Plan - 05/28/18 1808    Clinical Impression Statement  Patient demonstrates improvement with endurance requiring less sitting rest breaks compared to previous sessions. Patient continues to be limited in walking without an AD and requires sititng rest break after 258ft. Patient demonstrates decreased confidence with walking often reaching for objects to maintain balance and will benefit from further skilled therapy to return to prior level of function.     Rehab Potential  Fair    Clinical Impairments Affecting Rehab Potential  (+) family support, decreased coormorbities (-) age    PT Frequency  1x / week    PT Duration  8 weeks    PT Treatment/Interventions  Balance training;Neuromuscular re-education;Gait training;Patient/family education;Therapeutic exercise;Therapeutic activities;Functional mobility training;Stair training;Moist Heat;Aquatic Therapy;Electrical Stimulation;Cryotherapy;Manual techniques    PT Next Visit Plan  Attempt more dynamic balance tasks, ambulation over obstacles, ambulation with head turns, stepping response tasks    PT Home Exercise Plan  See education    Consulted and Agree with Plan of Care  Patient       Patient will benefit from skilled therapeutic intervention in order to improve the following deficits and impairments:  Abnormal gait, Decreased coordination, Decreased endurance, Decreased activity tolerance, Decreased strength, Decreased range of motion, Decreased balance, Difficulty walking  Visit Diagnosis: Difficulty in walking, not  elsewhere classified  Muscle weakness (generalized)     Problem List Patient Active Problem List   Diagnosis Date Noted  . Atrial fibrillation (HCC) 04/26/2017  . Anemia 04/26/2017  . 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 Macular degeneration of left eye 01/06/2015  . Essential hypertension 12/25/2013  . BACK PAIN 08/02/2010    Myrene Galas 05/28/2018, 6:13 PM  [place] [facility]INE [address]  [place], [place], [id number] Phone: [phone]   Fax:  [phone]  Name: Lattie CornsRoxanne D Garate MRN: [id number] Date of Birth: 06-29-1925

## 2018-05-28 NOTE — Patient Instructions (Signed)
If not getting better in 7-10 days, add in some over the counter allergy medication.   I would use Allegra 180 mg once a day - this is nondrowsy. (get the generic form)

## 2018-05-30 ENCOUNTER — Encounter

## 2018-05-30 ENCOUNTER — Ambulatory Visit: Payer: PPO | Admitting: Internal Medicine

## 2018-05-31 ENCOUNTER — Ambulatory Visit: Payer: PPO | Admitting: Nurse Practitioner

## 2018-05-31 ENCOUNTER — Encounter: Payer: Self-pay | Admitting: Nurse Practitioner

## 2018-05-31 VITALS — BP 146/74 | HR 77 | Ht 60.0 in | Wt 131.5 lb

## 2018-05-31 DIAGNOSIS — I10 Essential (primary) hypertension: Secondary | ICD-10-CM

## 2018-05-31 DIAGNOSIS — I482 Chronic atrial fibrillation: Secondary | ICD-10-CM

## 2018-05-31 DIAGNOSIS — I4821 Permanent atrial fibrillation: Secondary | ICD-10-CM

## 2018-05-31 NOTE — Progress Notes (Signed)
Office Visit    Patient Name: Paula Luna Date of Encounter: 05/31/2018  Primary Care Provider:  Hannah Luna, Spencer, MD Primary Cardiologist:  Paula Kendallhristopher End, MD  Chief Complaint    84 year old female with a history of hypertension, osteoporosis, anemia, and ischemic bowel status post small bowel resection, who presents for follow-up related to persistent atrial fibrillation.  Past Medical History    Past Medical History:  Diagnosis Date  . Anemia   . Back pain   . Hypertension 12/25/2013  . Ischemic bowel disease (HCC)    a. 03/2017 abd pain-->internal herina and ischemic jejunum s/p ex lap and small bowel resection.  . Macular degeneration of left eye 01/06/2015  . Osteoporosis   . Persistent atrial fibrillation (HCC)    a. First noted 03/2017;  b. 07/2017 Echo: EF 60-65%, no rwma, mild MR, mildly to mod dil LA; c. 07/2017 48hr Holter: persistent Afib, avg HR of 66 (range 39-140 bpm);  d. CHA2DS2VASc = 4-->Xarelto initiated 07/2017.   Past Surgical History:  Procedure Laterality Date  . CLOSED REDUCTION PROXIMAL HUMERUS FRACTURE Left 2017  . LAPAROSCOPIC SMALL BOWEL RESECTION    . LAPAROTOMY N/A 03/18/2017   Procedure: EXPLORATORY LAPAROTOMY, SMALL BOWEL RESECTION;  Surgeon: Paula Luna, Paula F, MD;  Location: [facility] ORS;  Service: General;  Laterality: N/A;  . TOTAL ABDOMINAL HYSTERECTOMY      Allergies  Allergies  Allergen Reactions  . Latex Rash  . Tetanus Toxoid Anaphylaxis    REACTION: anaphylaxis  . Alendronate Sodium     REACTION: passed out    History of Present Illness    84 year old female with the above past medical history including hypertension, anemia, osteoporosis, macular degeneration of the left eye, ischemic bowel status post small bowel resection, and persistent atrial fibrillation.  Atrial fibrillation was diagnosed in July 2018, when she was admitted to [facility] with abdominal pain and found to have an inguinal hernia with ischemic jejunum  for which exploratory laparotomy and small bowel resection was performed.  Following recovery from surgery, she had fatigue and dyspnea and was referred to primary care who again found atrial fibrillation and she was subsequently referred to cardiology in October 2018.  She has been anticoagulated with Xarelto and well rate controlled despite not requiring AV nodal blocking agent.  She has not previously been interested in pursuing cardioversion.  Since her last visit in January, she has done well.  She is working with physical therapy and both she and her daughter have noticed improved strength and exercise tolerance, especially when she uses her walker.  She does have some dyspnea on exertion if she is walking without her walker.  She has not had any chest pain and denies palpitations, PND, orthopnea, dizziness, syncope, edema, or early satiety.  She occasionally notes bruising which she admits causes her significant anxiety, though she has not had any significant bleeding.  Home Medications    Prior to Admission medications   Medication Sig Start Date Luna Date Taking? Authorizing Provider  docusate sodium (COLACE) 100 MG capsule Take 100 mg by mouth daily.     [provider]  fluticasone (FLONASE) 50 MCG/ACT nasal spray Place 2 sprays into both nostrils daily. 05/28/18   Paula, Paula HampshireSpencer, MD  lisinopril (PRINIVIL,ZESTRIL) 10 MG tablet TAKE 1 TABLET BY MOUTH ONCE DAILY 03/30/18   Luna, Paula E, MD  Multiple Vitamins-Minerals (ICAPS) CAPS Take 1 capsule by mouth daily.    [provider]  NON FORMULARY Vitamin D 50  mcg, take one daily by mouth    [provider]  rivaroxaban (XARELTO) 20 MG TABS tablet Take 1 tablet (20 mg total) by mouth daily with supper. 07/11/17   Luna, Paula Deer, MD    Review of Systems    She has some dyspnea on exertion when not using her walker but with physical therapy, she has had significant improvement in strength and overall exercise  tolerance.  She denies chest pain, palpitations, PND, orthopnea, dizziness, syncope, edema, or early satiety.  All other systems reviewed and are otherwise negative except as noted above.  Physical Exam    VS:  BP (!) 146/74 (BP Location: Left Arm, Patient Position: Sitting, Cuff Size: Normal)   Pulse 77   Ht 5' (1.524 m)   Wt 131 lb 8 oz (59.6 kg)   BMI 25.68 kg/m  , BMI Body mass index is 25.68 kg/m. GEN: Well nourished, well developed, in no acute distress. HEENT: normal. Neck: Supple, no JVD, carotid bruits, or masses. Cardiac: Irregularly irregular, no murmurs, rubs, or gallops. No clubbing, cyanosis, edema.  Radials/DP/PT 2+ and equal bilaterally.  Respiratory:  Respirations regular and unlabored, clear to auscultation bilaterally. GI: Soft, nontender, nondistended, BS + x 4. MS: no deformity or atrophy. Skin: warm and dry, no rash. Neuro:  Strength and sensation are intact. Psych: Normal affect.  Accessory Clinical Findings    ECG personally reviewed by me today -atrial fibrillation, 77, septal infarct, nonspecific T changes- no acute changes.  Assessment & Plan    1.  Persistent atrial fibrillation: Rate remains stable at 77.  She is not on an AV nodal blocking agent.  She remains on Xarelto therapy and had a normal CBC in June.  Renal function was stable at that time as well.  We again briefly discussed the possible role of cardioversion and she is not interested in pursuing this.  Exercise tolerance seems to have improved with physical therapy.  No changes today.  2.  Essential hypertension: Blood pressure elevated today at 146/74.  I repeated this and got 150/84.  She thinks her pressures run in the 120s to 140s at home.  We discussed potentially increasing lisinopril and then agreed that she will check her blood pressure daily for the next week and contact us with her numbers so that we can make a more informed decision based on her trends.  3.  Disposition: Follow-up in 6  months or sooner if necessary.  Paula Ducking, NP 05/31/2018, 11:07 AM

## 2018-05-31 NOTE — Patient Instructions (Signed)
Medication Instructions:  Your physician recommends that you continue on your current medications as directed. Please refer to the Current Medication list given to you today.   Labwork: NONE  Testing/Procedures: NONE  Follow-Up: Your physician recommends that you schedule a follow-up appointment in: 6 MONTHS WITH DR END.  If you need a refill on your cardiac medications before your next appointment, please call your pharmacy.   

## 2018-06-04 ENCOUNTER — Ambulatory Visit: Payer: PPO

## 2018-06-04 DIAGNOSIS — M6281 Muscle weakness (generalized): Secondary | ICD-10-CM

## 2018-06-04 DIAGNOSIS — R262 Difficulty in walking, not elsewhere classified: Secondary | ICD-10-CM

## 2018-06-04 NOTE — Therapy (Signed)
[facility] MEDICINE [address] [place], [address] Phone: [phone]   Fax:  [phone]  Physical Therapy Treatment  Patient Details  Name: DARRAH DREDGE MRN: [id number] Date of Birth: 26-May-1925 Referring Provider: Karleen Hampshire copland MD   Encounter Date: 06/04/2018  PT End of Session - 06/04/18 1715    Visit Number  34    Number of Visits  42    Date for PT Re-Evaluation  06/12/18    Authorization Type  FOTO    Authorization Time Period  3/10    PT Start Time  1705    PT Stop Time  1750    PT Time Calculation (min)  45 min    Equipment Utilized During Treatment  Gait belt    Activity Tolerance  Patient tolerated treatment well    Behavior During Therapy  [facility] for tasks assessed/performed       Past Medical History:  Diagnosis Date  . Anemia   . Back pain   . Hypertension 12/25/2013  . Ischemic bowel disease (HCC)    a. 03/2017 abd pain-->internal herina and ischemic jejunum s/p ex lap and small bowel resection.  . Macular degeneration of left eye 01/06/2015  . Osteoporosis   . Persistent atrial fibrillation (HCC)    a. First noted 03/2017;  b. 07/2017 Echo: EF 60-65%, no rwma, mild MR, mildly to mod dil LA; c. 07/2017 48hr Holter: persistent Afib, avg HR of 66 (range 39-140 bpm);  d. CHA2DS2VASc = 4-->Xarelto initiated 07/2017.    Past Surgical History:  Procedure Laterality Date  . CLOSED REDUCTION PROXIMAL HUMERUS FRACTURE Left 2017  . LAPAROSCOPIC SMALL BOWEL RESECTION    . LAPAROTOMY N/A 03/18/2017   Procedure: EXPLORATORY LAPAROTOMY, SMALL BOWEL RESECTION;  Surgeon: Leafy Ro, MD;  Location: [facility] ORS;  Service: General;  Laterality: N/A;  . TOTAL ABDOMINAL HYSTERECTOMY      There were no vitals filed for this visit.  Subjective Assessment - 06/04/18 1712    Subjective  Patient reports she went to the doctor who reports she was happy with her porgress with physical therapy. Patient reports she  can walk long with the walker.     Pertinent History  Small bowel surgery     Limitations  Standing;Walking    Patient Stated Goals  To walk without feeling of falling     Currently in Pain?  No/denies        TREATMENT Therapeutic Exercises Nu Step level 4 x to improve LE strength and muscular endurance Standing squats with UE support - 2 x 15 Standing hip extension with RTB - 2 x 15 Amb 250ft with significant rest breaks in between Side stepping up and over 6" - x 15 B Forward step ups on 6" step - x 10 B Heel raises with single leg stance -2 x 10 with UE support   Pt demonstrated increased fatigue at end of treatment session.   PT Education - 06/04/18 1714    Education provided  Yes    Education Details  form/technique with exercise     Person(s) Educated  Patient    Methods  Explanation;Demonstration    Comprehension  Verbalized understanding;Returned demonstration          PT Long Term Goals - 05/01/18 1100      PT LONG TERM GOAL #1   Title  Patient will be independent with HEP focused on decreasing fall risk to continue benefits of therapy  after discharge.     Baseline  Dependent with exercise performance; 01/01/2018: moderate cueing for performance. 03/28/18 min-mod cueing required; 04/30/18: min cueing required    Time  6    Period  Weeks    Status  On-going      PT LONG TERM GOAL #2   Title  Patient will improve TUG to under 12 sec to indicate singificant decrease in fall risk.     Baseline  20sec; 01/01/2018: 14sec; 02/12/2018; 11.59 sec    Time  8    Period  Weeks    Status  Achieved      PT LONG TERM GOAL #3   Title  Patient will improve her ability to perform to 36m/s to indicate increased safety with walking at community ambulation    Baseline  .43m/s; 01/01/2018: .81m/s; 02/12/2018: .4m/s; 03/28/18 .94 m/s; 8/19/9: testing of goals deferred to next session     Time  8    Period  Weeks    Status  On-going      PT LONG TERM GOAL #4   Title   Patient will improve 5XSTS to under 16 sec to indicate significant improvement LE functional strength and decrease fall risk.    Baseline  30sec; 01/01/2018: 12 sec    Time  8    Period  Weeks    Status  Achieved      PT LONG TERM GOAL #5   Title  Pt will ambulate 533ft in 3 minutes to demonstrate improved endurance with walking into department stores from the parking lot.     Baseline  03/28/18: 314ft; 04/30/18: testing of goals deferred to next session    Time  6    Period  Weeks    Status  New            Plan - 06/04/18 1755    Clinical Impression Statement  Did not perform ambulation today secondary to increased fatigue after performing exercises. Although no significant ambulation performed, focused on improving strength with stepping motions; patient requires UE support to perform. Patient will benefit from further skilled therapy to return to prior level of function.      Rehab Potential  Fair    Clinical Impairments Affecting Rehab Potential  (+) family support, decreased coormorbities (-) age    PT Frequency  1x / week    PT Duration  8 weeks    PT Treatment/Interventions  Balance training;Neuromuscular re-education;Gait training;Patient/family education;Therapeutic exercise;Therapeutic activities;Functional mobility training;Stair training;Moist Heat;Aquatic Therapy;Electrical Stimulation;Cryotherapy;Manual techniques    PT Next Visit Plan  Attempt more dynamic balance tasks, ambulation over obstacles, ambulation with head turns, stepping response tasks    PT Home Exercise Plan  See education    Consulted and Agree with Plan of Care  Patient       Patient will benefit from skilled therapeutic intervention in order to improve the following deficits and impairments:  Abnormal gait, Decreased coordination, Decreased endurance, Decreased activity tolerance, Decreased strength, Decreased range of motion, Decreased balance, Difficulty walking  Visit Diagnosis: Difficulty in  walking, not elsewhere classified  Muscle weakness (generalized)     Problem List Patient Active Problem List   Diagnosis Date Noted  . Atrial fibrillation (HCC) 04/26/2017  . Anemia 04/26/2017  . 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 Macular degeneration of left eye 01/06/2015  . Essential hypertension 12/25/2013  . BACK PAIN 08/02/2010    Myrene Galas, PT DPT 06/04/2018,  5:57 PM  [facility] MEDICINE [address] [place], [place], [id number] Phone: [phone]   Fax:  [phone]  Name: Lattie CornsRoxanne D Tse MRN: [id number] Date of Birth: 04/01/1925

## 2018-06-07 ENCOUNTER — Telehealth: Payer: Self-pay | Admitting: Internal Medicine

## 2018-06-07 NOTE — Telephone Encounter (Signed)
Patient calling the office for samples of medication:   1.  What medication and dosage are you requesting samples for? xarelto 20 mg po q day   2.  Are you currently out of this medication? Will be out soon in donut hole until end of year

## 2018-06-08 NOTE — Telephone Encounter (Signed)
Medication Samples have been provided to the patient.  Drug name: Xarelto   Strength: 20 mg       Qty: 21 LOT: [id number] Exp.Date: 6/21  Bishop Dublin 8:13 AM 06/08/2018

## 2018-06-11 ENCOUNTER — Ambulatory Visit: Payer: PPO

## 2018-06-11 DIAGNOSIS — M6281 Muscle weakness (generalized): Secondary | ICD-10-CM

## 2018-06-11 DIAGNOSIS — R262 Difficulty in walking, not elsewhere classified: Secondary | ICD-10-CM | POA: Diagnosis not present

## 2018-06-11 NOTE — Therapy (Signed)
[facility]TS MEDICINE [address] [place], [address] Phone: [phone]   Fax:  [phone]  Physical Therapy Treatment  Patient Details  Name: Paula Luna MRN: [id number] Date of Birth: Jan 11, 1925 Referring Provider (PT): spencer copland MD   Encounter Date: 06/11/2018  PT End of Session - 06/11/18 1734    Visit Number  35    Number of Visits  42    Date for PT Re-Evaluation  06/12/18    Authorization Type  FOTO    Authorization Time Period  4/10    PT Start Time  1722    PT Stop Time  1801    PT Time Calculation (min)  39 min    Equipment Utilized During Treatment  Gait belt    Activity Tolerance  Patient tolerated treatment well    Behavior During Therapy  [facility] for tasks assessed/performed       Past Medical History:  Diagnosis Date  . Anemia   . Back pain   . Hypertension 12/25/2013  . Ischemic bowel disease (HCC)    a. 03/2017 abd pain-->internal herina and ischemic jejunum s/p ex lap and small bowel resection.  . Macular degeneration of left eye 01/06/2015  . Osteoporosis   . Persistent atrial fibrillation (HCC)    a. First noted 03/2017;  b. 07/2017 Echo: EF 60-65%, no rwma, mild MR, mildly to mod dil LA; c. 07/2017 48hr Holter: persistent Afib, avg HR of 66 (range 39-140 bpm);  d. CHA2DS2VASc = 4-->Xarelto initiated 07/2017.    Past Surgical History:  Procedure Laterality Date  . CLOSED REDUCTION PROXIMAL HUMERUS FRACTURE Left 2017  . LAPAROSCOPIC SMALL BOWEL RESECTION    . LAPAROTOMY N/A 03/18/2017   Procedure: EXPLORATORY LAPAROTOMY, SMALL BOWEL RESECTION;  Surgeon: Leafy Ro, MD;  Location: [facility] ORS;  Service: General;  Laterality: N/A;  . TOTAL ABDOMINAL HYSTERECTOMY      There were no vitals filed for this visit.  Subjective Assessment - 06/11/18 1727    Subjective  Patient reports is hadnt had a bowel movement inthe past 3 days until today. Patient states she felt better after going  to the bathroom. Patient states her blood pressure has been high the past couple of days.     Pertinent History  Small bowel surgery     Limitations  Standing;Walking    Patient Stated Goals  To walk without feeling of falling     Currently in Pain?  No/denies       BP: 145/70; 154/81  TREATMENT Therapeutic Exercises Nu Step level 4 x to improve LE strength and muscular endurance Standing squats with UE support - 2 x 15 Standing hip extension - 2 x 15 Standing hip abduction - 2 x 15 Amb 119ft x 2; x3ft with significant rest breaks in between to improve endurance Heel raises with single leg stance -x 10 with UE support   Pt demonstrated increased fatigue at end of treatment session.   PT Education - 06/11/18 1733    Education provided  Yes    Education Details  form/technique with exercise    Person(s) Educated  Patient    Methods  Explanation;Demonstration    Comprehension  Verbalized understanding;Returned demonstration          PT Long Term Goals - 05/01/18 1100      PT LONG TERM GOAL #1   Title  Patient will be independent with HEP focused on decreasing fall risk to continue  benefits of therapy after discharge.     Baseline  Dependent with exercise performance; 01/01/2018: moderate cueing for performance. 03/28/18 min-mod cueing required; 04/30/18: min cueing required    Time  6    Period  Weeks    Status  On-going      PT LONG TERM GOAL #2   Title  Patient will improve TUG to under 12 sec to indicate singificant decrease in fall risk.     Baseline  20sec; 01/01/2018: 14sec; 02/12/2018; 11.59 sec    Time  8    Period  Weeks    Status  Achieved      PT LONG TERM GOAL #3   Title  Patient will improve her ability to perform to 63m/s to indicate increased safety with walking at community ambulation    Baseline  .64m/s; 01/01/2018: .69m/s; 02/12/2018: .29m/s; 03/28/18 .94 m/s; 8/19/9: testing of goals deferred to next session     Time  8    Period  Weeks     Status  On-going      PT LONG TERM GOAL #4   Title  Patient will improve 5XSTS to under 16 sec to indicate significant improvement LE functional strength and decrease fall risk.    Baseline  30sec; 01/01/2018: 12 sec    Time  8    Period  Weeks    Status  Achieved      PT LONG TERM GOAL #5   Title  Pt will ambulate 51ft in 3 minutes to demonstrate improved endurance with walking into department stores from the parking lot.     Baseline  03/28/18: 352ft; 04/30/18: testing of goals deferred to next session    Time  6    Period  Weeks    Status  New            Plan - 06/11/18 1802    Clinical Impression Statement  Patient demonstrates increased fatigue with ambulation requiring two sitting rest break with ambulating 24ft indicating poor mucular and cardiovascular endurance. However, patient demosntrates improvement with exercises with improved ability to perform squatting motions and heel raises. Patient will benefit from further skilled therapy to return to prior level of function.     Rehab Potential  Fair    Clinical Impairments Affecting Rehab Potential  (+) family support, decreased coormorbities (-) age    PT Frequency  1x / week    PT Duration  8 weeks    PT Treatment/Interventions  Balance training;Neuromuscular re-education;Gait training;Patient/family education;Therapeutic exercise;Therapeutic activities;Functional mobility training;Stair training;Moist Heat;Aquatic Therapy;Electrical Stimulation;Cryotherapy;Manual techniques    PT Next Visit Plan  Attempt more dynamic balance tasks, ambulation over obstacles, ambulation with head turns, stepping response tasks    PT Home Exercise Plan  See education    Consulted and Agree with Plan of Care  Patient       Patient will benefit from skilled therapeutic intervention in order to improve the following deficits and impairments:  Abnormal gait, Decreased coordination, Decreased endurance, Decreased activity tolerance, Decreased  strength, Decreased range of motion, Decreased balance, Difficulty walking  Visit Diagnosis: Difficulty in walking, not elsewhere classified  Muscle weakness (generalized)     Problem List Patient Active Problem List   Diagnosis Date Noted  . Atrial fibrillation (HCC) 04/26/2017  . Anemia 04/26/2017  . 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 Macular degeneration of left eye 01/06/2015  . Essential hypertension 12/25/2013  . BACK PAIN 08/02/2010  Myrene Galas, PT DPT 06/11/2018, 6:04 PM  [facility] PHYSICAL AND SPORTS MEDICINE [address], [place], [address] Phone: [phone]   Fax:  [phone]  Name: Paula Luna MRN: [id number] Date of Birth: 1924-12-17

## 2018-06-14 ENCOUNTER — Telehealth: Payer: Self-pay | Admitting: Internal Medicine

## 2018-06-14 NOTE — Telephone Encounter (Signed)
S/w patient's daughter. They received alert on MyCHart that EKG results were done but could not see the EKG itself. Advised her the EKG did show a. Fib but that it was stable. She was appreciative.

## 2018-06-14 NOTE — Telephone Encounter (Signed)
Patient daughter [name] calling States patient keeps getting alerts about EKG on mychart  Would like to confirm with nurse the results of EKG from last visit Please call to discuss, if not this afternoon, best call time is after 4p tomorrow

## 2018-06-18 ENCOUNTER — Ambulatory Visit: Payer: PPO | Admitting: Family Medicine

## 2018-06-18 ENCOUNTER — Ambulatory Visit: Payer: PPO

## 2018-06-20 ENCOUNTER — Ambulatory Visit (INDEPENDENT_AMBULATORY_CARE_PROVIDER_SITE_OTHER): Payer: PPO | Admitting: Family Medicine

## 2018-06-20 ENCOUNTER — Encounter: Payer: Self-pay | Admitting: Family Medicine

## 2018-06-20 VITALS — BP 160/90 | HR 92 | Temp 97.7°F | Ht 59.0 in | Wt 131.2 lb

## 2018-06-20 DIAGNOSIS — I10 Essential (primary) hypertension: Secondary | ICD-10-CM

## 2018-06-20 DIAGNOSIS — Z23 Encounter for immunization: Secondary | ICD-10-CM

## 2018-06-20 MED ORDER — LISINOPRIL 20 MG PO TABS: 20.0000 mg | ORAL_TABLET | Freq: Every day | ORAL | 3 refills | Status: DC

## 2018-06-20 NOTE — Progress Notes (Signed)
Dr. Karleen Hampshire T. Katelyne Galster, MD, CAQ Sports Medicine Primary Care and Sports Medicine [address] [place], [address] Phone: [phone] Fax: [phone]  06/20/2018  Patient: Paula Luna, MRN: [id number], DOB: 03-Jun-1925, 84 y.o.  Primary Physician:  Hannah Beat, MD   Chief Complaint  Patient presents with  . Blood Pressure Check   Subjective:   Hortence Charter Trabucco is a 84 y.o. very pleasant female patient who presents with the following:  BP is running high. Feels ok.   160/90 158/82  Nephew not doing well, has a brain tumor, in the ICU.   High dose flu shot  Past Medical History, Surgical History, Social History, Family History, Problem List, Medications, and Allergies have been reviewed and updated if relevant.  Patient Active Problem List   Diagnosis Date Noted  . Atrial fibrillation (HCC) 04/26/2017  . Anemia 04/26/2017  . 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 Macular degeneration of left eye 01/06/2015  . Essential hypertension 12/25/2013  . BACK PAIN 08/02/2010    Past Medical History:  Diagnosis Date  . Anemia   . Back pain   . Hypertension 12/25/2013  . Ischemic bowel disease (HCC)    a. 03/2017 abd pain-->internal herina and ischemic jejunum s/p ex lap and small bowel resection.  . Macular degeneration of left eye 01/06/2015  . Osteoporosis   . Persistent atrial fibrillation    a. First noted 03/2017;  b. 07/2017 Echo: EF 60-65%, no rwma, mild MR, mildly to mod dil LA; c. 07/2017 48hr Holter: persistent Afib, avg HR of 66 (range 39-140 bpm);  d. CHA2DS2VASc = 4-->Xarelto initiated 07/2017.    Past Surgical History:  Procedure Laterality Date  . CLOSED REDUCTION PROXIMAL HUMERUS FRACTURE Left 2017  . LAPAROSCOPIC SMALL BOWEL RESECTION    . LAPAROTOMY N/A 03/18/2017   Procedure: EXPLORATORY LAPAROTOMY, SMALL BOWEL RESECTION;  Surgeon: Leafy Ro, MD;  Location: [facility] ORS;  Service: General;   Laterality: N/A;  . TOTAL ABDOMINAL HYSTERECTOMY      Social History   Socioeconomic History  . Marital status: Married    Spouse name: Not on file  . Number of children: Not on file  . Years of education: Not on file  . Highest education level: Not on file  Occupational History  . Occupation: Musician: [id number]    Comment: 3 days a week  Social Needs  . Financial resource strain: Not on file  . Food insecurity:    Worry: Not on file    Inability: Not on file  . Transportation needs:    Medical: Not on file    Non-medical: Not on file  Tobacco Use  . Smoking status: Former Games developer  . Smokeless tobacco: Never Used  Substance and Sexual Activity  . Alcohol use: No  . Drug use: No  . Sexual activity: Never  Lifestyle  . Physical activity:    Days per week: Not on file    Minutes per session: Not on file  . Stress: Not on file  Relationships  . Social connections:    Talks on phone: Not on file    Gets together: Not on file    Attends religious service: Not on file    Active member of club or organization: Not on file    Attends meetings of clubs or organizations: Not on file    Relationship status: Not on file  . Intimate partner  violence:    Fear of current or ex partner: Not on file    Emotionally abused: Not on file    Physically abused: Not on file    Forced sexual activity: Not on file  Other Topics Concern  . Not on file  Social History Narrative   Widowed after 64 years   Working some at [employer] in [employer] with daughter    Family History  Problem Relation Age of Onset  . Diabetes Mother   . Cancer Father        jaw    Allergies  Allergen Reactions  . Latex Rash  . Tetanus Toxoid Anaphylaxis    REACTION: anaphylaxis  . Alendronate Sodium     REACTION: passed out    Medication list reviewed and updated in full in [facility].   GEN: No acute illnesses, no fevers, chills. GI: No n/v/d, eating  normally Pulm: No SOB Interactive and getting along well at home.  Otherwise, ROS is as per the HPI.  Objective:   BP (!) 160/90   Pulse 92   Temp 97.7 F (36.5 C) (Oral)   Ht 4\' 11"  (1.499 m)   Wt 131 lb 4 oz (59.5 kg)   BMI 26.51 kg/m   GEN: WDWN, NAD, Non-toxic, A & O x 3 HEENT: Atraumatic, Normocephalic. Neck supple. No masses, No LAD. Ears and Nose: No external deformity. CV: irreg, irreg, no murmur PULM: CTA B, no wheezes, crackles, rhonchi. No retractions. No resp. distress. No accessory muscle use. EXTR: No c/c/e NEURO Normal gait.  PSYCH: Normally interactive. Conversant. Not depressed or anxious appearing.  Calm demeanor.   Laboratory and Imaging Data:  Assessment and Plan:   Essential hypertension  Need for prophylactic vaccination and inoculation against influenza - Plan: Flu vaccine HIGH DOSE PF (Fluzone High dose)  Some may stress induced, but still elevated Will increase to lisinopril 20  Follow-up: reg only  Meds ordered this encounter  Medications  . lisinopril (PRINIVIL,ZESTRIL) 20 MG tablet    Sig: Take 1 tablet (20 mg total) by mouth daily.    Dispense:  90 tablet    Refill:  3   Orders Placed This Encounter  Procedures  . Flu vaccine HIGH DOSE PF (Fluzone High dose)    Signed,  Shatarra Wehling T. Cythnia Osmun, MD   Allergies as of 06/20/2018      Reactions   Latex Rash   Tetanus Toxoid Anaphylaxis   REACTION: anaphylaxis   Alendronate Sodium    REACTION: passed out      Medication List        Accurate as of 06/20/18  9:42 AM. Always use your most recent med list.          docusate sodium 100 MG capsule Commonly known as:  COLACE Take 100 mg by mouth daily.   fluticasone 50 MCG/ACT nasal spray Commonly known as:  FLONASE Place 2 sprays into both nostrils daily.   ICAPS Caps Take 1 capsule by mouth 2 (two) times daily.   lisinopril 20 MG tablet Commonly known as:  PRINIVIL,ZESTRIL Take 1 tablet (20 mg total) by mouth daily.     NON FORMULARY Vitamin D 50 mcg, take one daily by mouth   rivaroxaban 20 MG Tabs tablet Commonly known as:  XARELTO Take 1 tablet (20 mg total) by mouth daily with supper.

## 2018-06-27 ENCOUNTER — Ambulatory Visit: Payer: PPO | Attending: Family Medicine

## 2018-06-27 DIAGNOSIS — M6281 Muscle weakness (generalized): Secondary | ICD-10-CM | POA: Diagnosis not present

## 2018-06-27 DIAGNOSIS — R262 Difficulty in walking, not elsewhere classified: Secondary | ICD-10-CM

## 2018-06-28 NOTE — Therapy (Signed)
[place] [facility] PH[facility] MEDICINE [address] [place], [address] Phone: [phone]   Fax:  [phone]  Physical Therapy Treatment  Patient Details  Name: Paula Luna MRN: [id number] Date of Birth: 1924-11-15 Referring Provider (PT): spencer copland MD   Encounter Date: 06/27/2018  PT End of Session - 06/27/18 1903    Visit Number  36    Number of Visits  50    Date for PT Re-Evaluation  06/12/18    PT Start Time  1850    PT Stop Time  1930    PT Time Calculation (min)  40 min    Equipment Utilized During Treatment  Gait belt    Activity Tolerance  Patient tolerated treatment well    Behavior During Therapy  [facility] for tasks assessed/performed       Past Medical History:  Diagnosis Date  . Anemia   . Back pain   . Hypertension 12/25/2013  . Ischemic bowel disease (HCC)    a. 03/2017 abd pain-->internal herina and ischemic jejunum s/p ex lap and small bowel resection.  . Macular degeneration of left eye 01/06/2015  . Osteoporosis   . Persistent atrial fibrillation    a. First noted 03/2017;  b. 07/2017 Echo: EF 60-65%, no rwma, mild MR, mildly to mod dil LA; c. 07/2017 48hr Holter: persistent Afib, avg HR of 66 (range 39-140 bpm);  d. CHA2DS2VASc = 4-->Xarelto initiated 07/2017.    Past Surgical History:  Procedure Laterality Date  . CLOSED REDUCTION PROXIMAL HUMERUS FRACTURE Left 2017  . LAPAROSCOPIC SMALL BOWEL RESECTION    . LAPAROTOMY N/A 03/18/2017   Procedure: EXPLORATORY LAPAROTOMY, SMALL BOWEL RESECTION;  Surgeon: Leafy Ro, MD;  Location: [facility] ORS;  Service: General;  Laterality: N/A;  . TOTAL ABDOMINAL HYSTERECTOMY      There were no vitals filed for this visit.  Subjective Assessment - 06/27/18 1901    Subjective  Patient reports she went to her MD who the patient reports raised her blood pressure medication and has been taking her blood pressure regulary. Patient states increased pain along her  shoulder which she reports she was told by her MD to have PT give her an exercise.     Pertinent History  Small bowel surgery     Limitations  Standing;Walking    Patient Stated Goals  To walk without feeling of falling     Currently in Pain?  No/denies       TREATMENT Therapeutic Exercise: Nustep in sitting -- level 5 seat level and resistance level 3 --  x65min Sit to stand with ambulation -- x 5 with focus on improving speed  Ambulation around gym -- 21ft with focus on improving speed Focus on improving speed ambulating -- x 7ft x 4   Patient requires frequent sitting rest breaks.      PT Education - 06/27/18 1903    Education provided  Yes    Education Details  form/technique with exercise    Person(s) Educated  Patient    Methods  Explanation;Demonstration    Comprehension  Verbalized understanding;Returned demonstration          PT Long Term Goals - 06/28/18 0905      PT LONG TERM GOAL #1   Title  Patient will be independent with HEP focused on decreasing fall risk to continue benefits of therapy after discharge.     Baseline  Dependent with exercise performance; 01/01/2018: moderate cueing for performance. 03/28/18 min-mod  cueing required; 04/30/18: min cueing required    Time  6    Period  Weeks    Status  On-going      PT LONG TERM GOAL #2   Title  Patient will improve TUG to under 12 sec to indicate singificant decrease in fall risk.     Baseline  20sec; 01/01/2018: 14sec; 02/12/2018; 11.59 sec;    Time  8    Period  Weeks    Status  Achieved      PT LONG TERM GOAL #3   Title  Patient will improve her ability to perform to 68m/s to indicate increased safety with walking at community ambulation    Baseline  .42m/s; 01/01/2018: .87m/s; 02/12/2018: .6m/s; 03/28/18 .94 m/s; 8/19/9: testing of goals deferred to next session; 06/28/2018: .78m/s    Time  8    Period  Weeks    Status  On-going      PT LONG TERM GOAL #4   Title  Patient will improve 5XSTS to under  16 sec to indicate significant improvement LE functional strength and decrease fall risk.    Baseline  30sec; 01/01/2018: 12 sec    Time  8    Period  Weeks    Status  Achieved      PT LONG TERM GOAL #5   Title  Pt will ambulate 530ft in 3 minutes to demonstrate improved endurance with walking into department stores from the parking lot.     Baseline  03/28/18: 376ft; 04/30/18: testing of goals deferred to next session; 06/28/2018: 259ft    Time  6    Period  Weeks    Status  On-going            Plan - 06/28/18 1042    Clinical Impression Statement  Patient demonstrates limited improvement with long term goals however this is most likely due to the fact the patient had to miss therapy for two weeks prior secondary to having difficulties with her blood pressure and going to her physician for further analysis. Patient demonstrates improvement in strength but decreased endurance overall. Patient will benefit from further skilled therapy to return to prior level of function.      Rehab Potential  Fair    Clinical Impairments Affecting Rehab Potential  (+) family support, decreased coormorbities (-) age    PT Frequency  1x / week    PT Duration  8 weeks    PT Treatment/Interventions  Balance training;Neuromuscular re-education;Gait training;Patient/family education;Therapeutic exercise;Therapeutic activities;Functional mobility training;Stair training;Moist Heat;Aquatic Therapy;Electrical Stimulation;Cryotherapy;Manual techniques    PT Next Visit Plan  Attempt more dynamic balance tasks, ambulation over obstacles, ambulation with head turns, stepping response tasks    PT Home Exercise Plan  See education    Consulted and Agree with Plan of Care  Patient       Patient will benefit from skilled therapeutic intervention in order to improve the following deficits and impairments:  Abnormal gait, Decreased coordination, Decreased endurance, Decreased activity tolerance, Decreased strength,  Decreased range of motion, Decreased balance, Difficulty walking  Visit Diagnosis: Difficulty in walking, not elsewhere classified  Muscle weakness (generalized)     Problem List Patient Active Problem List   Diagnosis Date Noted  . Atrial fibrillation (HCC) 04/26/2017  . Anemia 04/26/2017  . 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 Macular degeneration of left eye 01/06/2015  . Essential hypertension 12/25/2013  . BACK PAIN 08/02/2010  Myrene Galas, PT DPT 06/28/2018, 1:12 PM  [facility] [facility] PHYSICAL AND SPORTS MEDICINE [address] [place], [address] Phone: [phone]   Fax:  [phone]  Name: CHELCY BOLDA MRN: [id number] Date of Birth: 1924-12-12

## 2018-07-02 ENCOUNTER — Ambulatory Visit: Payer: PPO

## 2018-07-02 DIAGNOSIS — R262 Difficulty in walking, not elsewhere classified: Secondary | ICD-10-CM

## 2018-07-02 DIAGNOSIS — M6281 Muscle weakness (generalized): Secondary | ICD-10-CM

## 2018-07-02 NOTE — Therapy (Signed)
[place] [facility] PHYS[facility]ORTS MEDICINE [address] [place], [address] Phone: [phone]   Fax:  [phone]  Physical Therapy Treatment  Patient Details  Name: Paula Luna MRN: [id number] Date of Birth: 1925/01/17 Referring Provider (PT): spencer copland MD   Encounter Date: 07/02/2018  PT End of Session - 07/02/18 1728    Visit Number  37    Number of Visits  50    Date for PT Re-Evaluation  07/25/18    PT Start Time  1715    PT Stop Time  1800    PT Time Calculation (min)  45 min    Equipment Utilized During Treatment  Gait belt    Activity Tolerance  Patient tolerated treatment well    Behavior During Therapy  [facility] for tasks assessed/performed       Past Medical History:  Diagnosis Date  . Anemia   . Back pain   . Hypertension 12/25/2013  . Ischemic bowel disease (HCC)    a. 03/2017 abd pain-->internal herina and ischemic jejunum s/p ex lap and small bowel resection.  . Macular degeneration of left eye 01/06/2015  . Osteoporosis   . Persistent atrial fibrillation    a. First noted 03/2017;  b. 07/2017 Echo: EF 60-65%, no rwma, mild MR, mildly to mod dil LA; c. 07/2017 48hr Holter: persistent Afib, avg HR of 66 (range 39-140 bpm);  d. CHA2DS2VASc = 4-->Xarelto initiated 07/2017.    Past Surgical History:  Procedure Laterality Date  . CLOSED REDUCTION PROXIMAL HUMERUS FRACTURE Left 2017  . LAPAROSCOPIC SMALL BOWEL RESECTION    . LAPAROTOMY N/A 03/18/2017   Procedure: EXPLORATORY LAPAROTOMY, SMALL BOWEL RESECTION;  Surgeon: Leafy Ro, MD;  Location: [facility] ORS;  Service: General;  Laterality: N/A;  . TOTAL ABDOMINAL HYSTERECTOMY      There were no vitals filed for this visit.  Subjective Assessment - 07/02/18 1723    Subjective  Patient reports she was tired this weekend from family obligations and states she did greater walking.     Pertinent History  Small bowel surgery     Limitations  Standing;Walking     Patient Stated Goals  To walk without feeling of falling     Currently in Pain?  No/denies       TREATMENT Therapeutic Exercise: Nustep in sitting -- level 5 seat level and resistance level 4 - x31min Ambulation around gym -- 273ft with focus on improving speed Step ups forward - x 10  Sit to stand with ambulation -- x 10 with focus on improving speed  Side stepping up and over with UE support - x 10 B Hip abduction in standing with RTB around knees - 2 x 10  Hip extension in standing with RTB around knees - 2 x 10    Patient requires frequent sitting rest breaks.    PT Education - 07/02/18 1727    Education provided  Yes    Education Details  form/technique with exercise    Person(s) Educated  Patient    Methods  Explanation;Demonstration    Comprehension  Verbalized understanding;Returned demonstration          PT Long Term Goals - 06/28/18 0905      PT LONG TERM GOAL #1   Title  Patient will be independent with HEP focused on decreasing fall risk to continue benefits of therapy after discharge.     Baseline  Dependent with exercise performance; 01/01/2018: moderate cueing for performance. 03/28/18  min-mod cueing required; 04/30/18: min cueing required    Time  6    Period  Weeks    Status  On-going      PT LONG TERM GOAL #2   Title  Patient will improve TUG to under 12 sec to indicate singificant decrease in fall risk.     Baseline  20sec; 01/01/2018: 14sec; 02/12/2018; 11.59 sec;    Time  8    Period  Weeks    Status  Achieved      PT LONG TERM GOAL #3   Title  Patient will improve her ability to perform to 7m/s to indicate increased safety with walking at community ambulation    Baseline  .40m/s; 01/01/2018: .68m/s; 02/12/2018: .43m/s; 03/28/18 .94 m/s; 8/19/9: testing of goals deferred to next session; 06/28/2018: .26m/s    Time  8    Period  Weeks    Status  On-going      PT LONG TERM GOAL #4   Title  Patient will improve 5XSTS to under 16 sec to indicate  significant improvement LE functional strength and decrease fall risk.    Baseline  30sec; 01/01/2018: 12 sec    Time  8    Period  Weeks    Status  Achieved      PT LONG TERM GOAL #5   Title  Pt will ambulate 536ft in 3 minutes to demonstrate improved endurance with walking into department stores from the parking lot.     Baseline  03/28/18: 32ft; 04/30/18: testing of goals deferred to next session; 06/28/2018: 214ft    Time  6    Period  Weeks    Status  On-going            Plan - 07/02/18 1747    Clinical Impression Statement  COnitnued to focus on improving strenthening exercises today to improve endurance with standing and walking. Patient demonstrated improvement with ability to perform greater amount of exercises today versus previous sessions indicating functional carryover between session. Patient will benefit from further skilled therapy to return to prior level of function.     Rehab Potential  Fair    Clinical Impairments Affecting Rehab Potential  (+) family support, decreased coormorbities (-) age    PT Frequency  1x / week    PT Duration  8 weeks    PT Treatment/Interventions  Balance training;Neuromuscular re-education;Gait training;Patient/family education;Therapeutic exercise;Therapeutic activities;Functional mobility training;Stair training;Moist Heat;Aquatic Therapy;Electrical Stimulation;Cryotherapy;Manual techniques    PT Next Visit Plan  Attempt more dynamic balance tasks, ambulation over obstacles, ambulation with head turns, stepping response tasks    PT Home Exercise Plan  See education    Consulted and Agree with Plan of Care  Patient       Patient will benefit from skilled therapeutic intervention in order to improve the following deficits and impairments:  Abnormal gait, Decreased coordination, Decreased endurance, Decreased activity tolerance, Decreased strength, Decreased range of motion, Decreased balance, Difficulty walking  Visit  Diagnosis: Difficulty in walking, not elsewhere classified  Muscle weakness (generalized)     Problem List Patient Active Problem List   Diagnosis Date Noted  . Atrial fibrillation (HCC) 04/26/2017  . Anemia 04/26/2017  . 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 Macular degeneration of left eye 01/06/2015  . Essential hypertension 12/25/2013  . BACK PAIN 08/02/2010    Myrene Galas, PT DPT 07/02/2018, 6:40 PM  [facility] PHYSICAL AND  SPORTS MEDICINE [address], [place], [address] Phone: [phone]   Fax:  [phone]  Name: ELLENOR GOTTO MRN: [id number] Date of Birth: 22-Jun-1925

## 2018-07-09 ENCOUNTER — Ambulatory Visit: Payer: PPO

## 2018-07-09 ENCOUNTER — Telehealth: Payer: Self-pay

## 2018-07-09 DIAGNOSIS — M6281 Muscle weakness (generalized): Secondary | ICD-10-CM

## 2018-07-09 DIAGNOSIS — R262 Difficulty in walking, not elsewhere classified: Secondary | ICD-10-CM | POA: Diagnosis not present

## 2018-07-09 NOTE — Therapy (Signed)
[place] [facility] PHYS[facility] SPORTS MEDICINE [address] [place], [address] Phone: [phone]   Fax:  [phone]  Physical Therapy Treatment  Patient Details  Name: Paula Luna MRN: [id number] Date of Birth: Jan 13, 1925 Referring Provider (PT): spencer copland MD   Encounter Date: 07/09/2018  PT End of Session - 07/09/18 1727    Visit Number  38    Number of Visits  50    Date for PT Re-Evaluation  07/25/18    PT Start Time  1715    PT Stop Time  1800    PT Time Calculation (min)  45 min    Equipment Utilized During Treatment  Gait belt    Activity Tolerance  Patient tolerated treatment well    Behavior During Therapy  [facility] for tasks assessed/performed       Past Medical History:  Diagnosis Date  . Anemia   . Back pain   . Hypertension 12/25/2013  . Ischemic bowel disease (HCC)    a. 03/2017 abd pain-->internal herina and ischemic jejunum s/p ex lap and small bowel resection.  . Macular degeneration of left eye 01/06/2015  . Osteoporosis   . Persistent atrial fibrillation    a. First noted 03/2017;  b. 07/2017 Echo: EF 60-65%, no rwma, mild MR, mildly to mod dil LA; c. 07/2017 48hr Holter: persistent Afib, avg HR of 66 (range 39-140 bpm);  d. CHA2DS2VASc = 4-->Xarelto initiated 07/2017.    Past Surgical History:  Procedure Laterality Date  . CLOSED REDUCTION PROXIMAL HUMERUS FRACTURE Left 2017  . LAPAROSCOPIC SMALL BOWEL RESECTION    . LAPAROTOMY N/A 03/18/2017   Procedure: EXPLORATORY LAPAROTOMY, SMALL BOWEL RESECTION;  Surgeon: Leafy Ro, MD;  Location: [facility] ORS;  Service: General;  Laterality: N/A;  . TOTAL ABDOMINAL HYSTERECTOMY      There were no vitals filed for this visit.  Subjective Assessment - 07/09/18 1722    Subjective  Patient reports she went to sit down and hit her hand and hip and states she has a bruise. Patient states she requires to walk with a walker when going long distances outside the home.     Pertinent History  Small bowel surgery     Limitations  Standing;Walking    Patient Stated Goals  To walk without feeling of falling     Currently in Pain?  No/denies       TREATMENT Therapeutic Exercise: Nustep in sitting -- level 7 seat level and resistance level 4 - x30min Ambulation around gym -- x136ft, x 256ft with focus on improving speed Forward stepping onto airex pad on balance mat - x 10 B  Side stepping 6" step with UE support - x 10 B Sit to stand with ambulation - x 5 with 50ft focus on improving speed  Winding in and out of cones - x 12 cones     Patient demonstrates increased fatigue at the end of the session   PT Education - 07/09/18 1726    Education provided  Yes    Education Details  form/technique with exercise    Person(s) Educated  Patient    Methods  Explanation;Demonstration    Comprehension  Verbalized understanding;Returned demonstration          PT Long Term Goals - 06/28/18 0905      PT LONG TERM GOAL #1   Title  Patient will be independent with HEP focused on decreasing fall risk to continue benefits of therapy after discharge.  Baseline  Dependent with exercise performance; 01/01/2018: moderate cueing for performance. 03/28/18 min-mod cueing required; 04/30/18: min cueing required    Time  6    Period  Weeks    Status  On-going      PT LONG TERM GOAL #2   Title  Patient will improve TUG to under 12 sec to indicate singificant decrease in fall risk.     Baseline  20sec; 01/01/2018: 14sec; 02/12/2018; 11.59 sec;    Time  8    Period  Weeks    Status  Achieved      PT LONG TERM GOAL #3   Title  Patient will improve her ability to perform to 9m/s to indicate increased safety with walking at community ambulation    Baseline  .66m/s; 01/01/2018: .76m/s; 02/12/2018: .69m/s; 03/28/18 .94 m/s; 8/19/9: testing of goals deferred to next session; 06/28/2018: .34m/s    Time  8    Period  Weeks    Status  On-going      PT LONG TERM GOAL #4   Title   Patient will improve 5XSTS to under 16 sec to indicate significant improvement LE functional strength and decrease fall risk.    Baseline  30sec; 01/01/2018: 12 sec    Time  8    Period  Weeks    Status  Achieved      PT LONG TERM GOAL #5   Title  Pt will ambulate 543ft in 3 minutes to demonstrate improved endurance with walking into department stores from the parking lot.     Baseline  03/28/18: 357ft; 04/30/18: testing of goals deferred to next session; 06/28/2018: 275ft    Time  6    Period  Weeks    Status  On-going            Plan - 07/09/18 1727    Clinical Impression Statement  Patient demonstrates improvement with ambulation today with ability to walk for 340ft with requiring one sitting rest break compared to 254ft over the previous session. Patient continues to require UE support to perform most exercises in standing and requires CGA to perform ambulation. Patient will benefit from further skilled therapy to return to prior level of function.     Rehab Potential  Fair    Clinical Impairments Affecting Rehab Potential  (+) family support, decreased coormorbities (-) age    PT Frequency  1x / week    PT Duration  8 weeks    PT Treatment/Interventions  Balance training;Neuromuscular re-education;Gait training;Patient/family education;Therapeutic exercise;Therapeutic activities;Functional mobility training;Stair training;Moist Heat;Aquatic Therapy;Electrical Stimulation;Cryotherapy;Manual techniques    PT Next Visit Plan  Attempt more dynamic balance tasks, ambulation over obstacles, ambulation with head turns, stepping response tasks    PT Home Exercise Plan  See education    Consulted and Agree with Plan of Care  Patient       Patient will benefit from skilled therapeutic intervention in order to improve the following deficits and impairments:  Abnormal gait, Decreased coordination, Decreased endurance, Decreased activity tolerance, Decreased strength, Decreased range of  motion, Decreased balance, Difficulty walking  Visit Diagnosis: Difficulty in walking, not elsewhere classified  Muscle weakness (generalized)     Problem List Patient Active Problem List   Diagnosis Date Noted  . Atrial fibrillation (HCC) 04/26/2017  . Anemia 04/26/2017  . 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 Macular degeneration of left eye 01/06/2015  . Essential hypertension 12/25/2013  . BACK PAIN  08/02/2010    Myrene Galas, PT DPT 07/09/2018, 6:18 PM  [facility] PHYSICAL AND SPORTS MEDICINE [address], [place], [address] Phone: [phone]   Fax:  [phone]  Name: Paula Luna MRN: [id number] Date of Birth: May 25, 1925

## 2018-07-09 NOTE — Telephone Encounter (Signed)
I spoke with [name]; the juror # is [phone]. The date for jury duty is 08/23/18. Pts daughter does not want to send in birth certificate or drivers license proving pt is over age 82 which would exempt pt. [name] requesting letter for permanent removal from jury duty list. [name] request cb when letter is ready.

## 2018-07-09 NOTE — Telephone Encounter (Signed)
Copied from CRM [id number]. Topic: General - Other >> Jul 09, 2018 12:38 PM Stephannie Li, NT wrote: Reason for CRM:  Patient has received jury duty paper work and is requesting a letter  for permanent removal from serving on jury duty please give her a letter that details why she is unable to serve .Please call her daughter [name] when the letter is ready for pick up or any questions please call her at  [phone].

## 2018-07-16 ENCOUNTER — Ambulatory Visit: Payer: PPO

## 2018-07-17 NOTE — Telephone Encounter (Signed)
Pt's daughter calling back concerning the letter for pt's dismissal for juror duty. Please call pt's daughter when ready. Please advise

## 2018-07-17 NOTE — Telephone Encounter (Signed)
Needs to await Dr. Salena Saner return.

## 2018-07-17 NOTE — Telephone Encounter (Signed)
Left message for Paula Luna that Dr. Patsy Lager has been out of the office for 3 weeks and we will have to wait on his return before Juror Letter can be written.  I advised I would call her as soon as the letter is ready.

## 2018-07-17 NOTE — Telephone Encounter (Signed)
Previous message was routed to Dr Dallas Schimke

## 2018-07-23 ENCOUNTER — Ambulatory Visit: Payer: PPO | Attending: Family Medicine

## 2018-07-23 DIAGNOSIS — R262 Difficulty in walking, not elsewhere classified: Secondary | ICD-10-CM | POA: Insufficient documentation

## 2018-07-23 DIAGNOSIS — M6281 Muscle weakness (generalized): Secondary | ICD-10-CM | POA: Diagnosis not present

## 2018-07-23 NOTE — Therapy (Signed)
[place] [facility] [address] [place], [address] Phone: [phone]   Fax:  [phone]  Physical Therapy Treatment  Patient Details  Name: Paula Luna MRN: [id number] Date of Birth: Feb 25, 1925 Referring Provider (PT): spencer copland MD   Encounter Date: 07/23/2018  PT End of Session - 07/23/18 1008    Visit Number  39    Number of Visits  50    Date for PT Re-Evaluation  07/25/18    PT Start Time  0945    PT Stop Time  1030    PT Time Calculation (min)  45 min    Equipment Utilized During Treatment  Gait belt    Activity Tolerance  Patient tolerated treatment well    Behavior During Therapy  [facility] for tasks assessed/performed       Past Medical History:  Diagnosis Date  . Anemia   . Back pain   . Hypertension 12/25/2013  . Ischemic bowel disease (HCC)    a. 03/2017 abd pain-->internal herina and ischemic jejunum s/p ex lap and small bowel resection.  . Macular degeneration of left eye 01/06/2015  . Osteoporosis   . Persistent atrial fibrillation    a. First noted 03/2017;  b. 07/2017 Echo: EF 60-65%, no rwma, mild MR, mildly to mod dil LA; c. 07/2017 48hr Holter: persistent Afib, avg HR of 66 (range 39-140 bpm);  d. CHA2DS2VASc = 4-->Xarelto initiated 07/2017.    Past Surgical History:  Procedure Laterality Date  . CLOSED REDUCTION PROXIMAL HUMERUS FRACTURE Left 2017  . LAPAROSCOPIC SMALL BOWEL RESECTION    . LAPAROTOMY N/A 03/18/2017   Procedure: EXPLORATORY LAPAROTOMY, SMALL BOWEL RESECTION;  Surgeon: Leafy Ro, MD;  Location: [facility] ORS;  Service: General;  Laterality: N/A;  . TOTAL ABDOMINAL HYSTERECTOMY      There were no vitals filed for this visit.  Subjective Assessment - 07/23/18 1001    Subjective  Pateint reports she has been walking around the house for exercise and asending/desending the stairs.     Pertinent History  Small bowel surgery     Limitations  Standing;Walking    Patient Stated Goals  To walk without feeling of falling     Currently in Pain?  No/denies        TREATMENT Therapeutic Exercise: Nustep in sitting -- level 7 seat level and resistance level 6 - x33min; level 5 - x73min Hip abduction in standing - 2 x 15 Squats in standing - 2 x 15 Side stepping up and down - 2 x 15  Standing on airex - 8 x 20sec with intermittent UE support Ambulation around gym -- x156ft, x 123ft with focus on improving speed   Patient demonstrates increased fatigue at the end of the session   PT Education - 07/23/18 1006    Education provided  Yes    Education Details  form/technique with exercise    Person(s) Educated  Patient    Methods  Explanation;Demonstration    Comprehension  Verbalized understanding;Returned demonstration          PT Long Term Goals - 06/28/18 0905      PT LONG TERM GOAL #1   Title  Patient will be independent with HEP focused on decreasing fall risk to continue benefits of therapy after discharge.     Baseline  Dependent with exercise performance; 01/01/2018: moderate cueing for performance. 03/28/18 min-mod cueing required; 04/30/18: min cueing required    Time  6  Period  Weeks    Status  On-going      PT LONG TERM GOAL #2   Title  Patient will improve TUG to under 12 sec to indicate singificant decrease in fall risk.     Baseline  20sec; 01/01/2018: 14sec; 02/12/2018; 11.59 sec;    Time  8    Period  Weeks    Status  Achieved      PT LONG TERM GOAL #3   Title  Patient will improve her ability to perform to 45m/s to indicate increased safety with walking at community ambulation    Baseline  .27m/s; 01/01/2018: .86m/s; 02/12/2018: .10m/s; 03/28/18 .94 m/s; 8/19/9: testing of goals deferred to next session; 06/28/2018: .42m/s    Time  8    Period  Weeks    Status  On-going      PT LONG TERM GOAL #4   Title  Patient will improve 5XSTS to under 16 sec to indicate significant improvement LE functional strength and decrease fall  risk.    Baseline  30sec; 01/01/2018: 12 sec    Time  8    Period  Weeks    Status  Achieved      PT LONG TERM GOAL #5   Title  Pt will ambulate 537ft in 3 minutes to demonstrate improved endurance with walking into department stores from the parking lot.     Baseline  03/28/18: 366ft; 04/30/18: testing of goals deferred to next session; 06/28/2018: 267ft    Time  6    Period  Weeks    Status  On-going            Plan - 07/23/18 1011    Clinical Impression Statement  Patient demonstrates ability to perform greater amount of standing exercises however continues to require UE support to perform exercises. Patient continues to have increased difficulty with standing balance with decreased confidence to perform. Patient demonstrates improvement overall and will benefit from further skilled therapy to return to prior level of function.     Rehab Potential  Fair    Clinical Impairments Affecting Rehab Potential  (+) family support, decreased coormorbities (-) age    PT Frequency  1x / week    PT Duration  8 weeks    PT Treatment/Interventions  Balance training;Neuromuscular re-education;Gait training;Patient/family education;Therapeutic exercise;Therapeutic activities;Functional mobility training;Stair training;Moist Heat;Aquatic Therapy;Electrical Stimulation;Cryotherapy;Manual techniques    PT Next Visit Plan  Attempt more dynamic balance tasks, ambulation over obstacles, ambulation with head turns, stepping response tasks    PT Home Exercise Plan  See education    Consulted and Agree with Plan of Care  Patient       Patient will benefit from skilled therapeutic intervention in order to improve the following deficits and impairments:  Abnormal gait, Decreased coordination, Decreased endurance, Decreased activity tolerance, Decreased strength, Decreased range of motion, Decreased balance, Difficulty walking  Visit Diagnosis: Difficulty in walking, not elsewhere classified  Muscle  weakness (generalized)     Problem List Patient Active Problem List   Diagnosis Date Noted  . Atrial fibrillation (HCC) 04/26/2017  . Anemia 04/26/2017  . 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 Macular degeneration of left eye 01/06/2015  . Essential hypertension 12/25/2013  . BACK PAIN 08/02/2010    Myrene Galas, PT DPT 07/23/2018, 10:30 AM  [place] [facility] PHYSICAL AND SPORTS MEDICINE [address] [place], [address] Phone: [phone]   Fax:  [phone]  Name: Paula Luna MRN: [id number] Date of Birth: 07-Aug-1925

## 2018-07-24 ENCOUNTER — Telehealth: Payer: Self-pay | Admitting: Internal Medicine

## 2018-07-24 NOTE — Telephone Encounter (Signed)
Patient daughter Dewayne Hatchnn calling Would like to speak with nurse Victorino DikeJennifer, no details were given when asked Please call to discuss

## 2018-07-25 NOTE — Telephone Encounter (Signed)
Called patient's daughter, ok per DPR.  Patient is in the donut hole and having trouble affording her medication.  Patient has enough for the next 4 weeks but may need some more samples to get her through December and out of donut hole.  MyChart Message sent to patient with information that we have samples for her to pick up.  Medication Samples have been provided to the patient.  Drug name: Xarelto       Strength: 20 mg        Qty: 6 bottles  LOT: [id number]  Exp.Date: 11/2019

## 2018-07-26 ENCOUNTER — Encounter: Payer: Self-pay | Admitting: Family Medicine

## 2018-07-26 MED ORDER — RIVAROXABAN 20 MG PO TABS: 20.0000 mg | ORAL_TABLET | Freq: Every day | ORAL | 3 refills | Status: DC

## 2018-07-26 NOTE — Addendum Note (Signed)
Addended by: Stann MainlandLARK, JENNIFER O on: 07/26/2018 07:45 AM   Modules accepted: Orders

## 2018-07-26 NOTE — Progress Notes (Signed)
Patient Active Problem List   Diagnosis Date Noted  . Atrial fibrillation (HCC) 04/26/2017  . Anemia 04/26/2017  . 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 Macular degeneration of left eye 01/06/2015  . Essential hypertension 12/25/2013  . BACK PAIN 08/02/2010

## 2018-07-27 NOTE — Telephone Encounter (Signed)
I spoke to TroyAnn and she'll come in to pick up letter today.

## 2018-07-31 ENCOUNTER — Ambulatory Visit: Payer: PPO

## 2018-07-31 DIAGNOSIS — R262 Difficulty in walking, not elsewhere classified: Secondary | ICD-10-CM

## 2018-07-31 DIAGNOSIS — M6281 Muscle weakness (generalized): Secondary | ICD-10-CM

## 2018-08-01 NOTE — Therapy (Signed)
[place] [facility] [address] [place], [address] Phone: [phone]   Fax:  [phone]  Physical Therapy Treatment  Patient Details  Name: Paula Luna MRN: [id number] Date of Birth: 04-22-1925 Referring Provider (PT): spencer copland MD   Encounter Date: 07/31/2018  PT End of Session - 08/01/18 1250    Visit Number  39    Number of Visits  50    Date for PT Re-Evaluation  09/11/18    PT Start Time  1615    PT Stop Time  1700    PT Time Calculation (min)  45 min    Equipment Utilized During Treatment  Gait belt    Activity Tolerance  Patient tolerated treatment well    Behavior During Therapy  [facility] for tasks assessed/performed       Past Medical History:  Diagnosis Date  . Anemia   . Back pain   . Hypertension 12/25/2013  . Ischemic bowel disease (HCC)    a. 03/2017 abd pain-->internal herina and ischemic jejunum s/p ex lap and small bowel resection.  . Macular degeneration of left eye 01/06/2015  . Osteoporosis   . Persistent atrial fibrillation    a. First noted 03/2017;  b. 07/2017 Echo: EF 60-65%, no rwma, mild MR, mildly to mod dil LA; c. 07/2017 48hr Holter: persistent Afib, avg HR of 66 (range 39-140 bpm);  d. CHA2DS2VASc = 4-->Xarelto initiated 07/2017.    Past Surgical History:  Procedure Laterality Date  . CLOSED REDUCTION PROXIMAL HUMERUS FRACTURE Left 2017  . LAPAROSCOPIC SMALL BOWEL RESECTION    . LAPAROTOMY N/A 03/18/2017   Procedure: EXPLORATORY LAPAROTOMY, SMALL BOWEL RESECTION;  Surgeon: Leafy Ro, MD;  Location: [facility] ORS;  Service: General;  Laterality: N/A;  . TOTAL ABDOMINAL HYSTERECTOMY      There were no vitals filed for this visit.  Subjective Assessment - 07/31/18 1616    Subjective  Patient reports she believes she is having a cold and states increased difficulty with walking today which she reports is from the weather.    Pertinent History  Small bowel surgery     Limitations  Standing;Walking    Patient Stated Goals  To walk without feeling of falling     Currently in Pain?  No/denies         TREATMENT  Therapeutic Exercise: Nustep in sitting -- level 7 seat level and resistance level 4- x10 min Hip abduction in standing - 2 x 10 Ambulation around gym - x300 with focus on improving speed Hip extension in standing - x 10  Squats in standing -  x 10 Side stepping up and down - 2 x 10 Single leg stance - x 10 with 5sec holds     Patient demonstrates increased fatigue at the end of the session   PT Education - 08/01/18 1250    Education provided  Yes    Education Details  POC; form/technique with exercise    Person(s) Educated  Patient    Methods  Explanation;Demonstration    Comprehension  Verbalized understanding;Returned demonstration          PT Long Term Goals - 06/28/18 0905      PT LONG TERM GOAL #1   Title  Patient will be independent with HEP focused on decreasing fall risk to continue benefits of therapy after discharge.     Baseline  Dependent with exercise performance; 01/01/2018: moderate cueing for performance. 03/28/18 min-mod cueing required; 04/30/18:  min cueing required    Time  6    Period  Weeks    Status  On-going      PT LONG TERM GOAL #2   Title  Patient will improve TUG to under 12 sec to indicate singificant decrease in fall risk.     Baseline  20sec; 01/01/2018: 14sec; 02/12/2018; 11.59 sec;    Time  8    Period  Weeks    Status  Achieved      PT LONG TERM GOAL #3   Title  Patient will improve her ability to perform 10mWT to 29332m/s to indicate increased safety with walking at community ambulation    Baseline  .784332m/s; 01/01/2018: .5142m/s; 02/12/2018: .3360m/s; 03/28/18 .94 m/s; 8/19/9: testing of goals deferred to next session; 06/28/2018: .432m/s    Time  8    Period  Weeks    Status  On-going      PT LONG TERM GOAL #4   Title  Patient will improve 5XSTS to under 16 sec to indicate significant improvement LE  functional strength and decrease fall risk.    Baseline  30sec; 01/01/2018: 12 sec    Time  8    Period  Weeks    Status  Achieved      PT LONG TERM GOAL #5   Title  Pt will ambulate 5800ft in 3 minutes to demonstrate improved endurance with walking into department stores from the parking lot.     Baseline  03/28/18: 35050ft; 04/30/18: testing of goals deferred to next session; 06/28/2018: 23350ft    Time  6    Period  Weeks    Status  On-going            Plan - 08/01/18 1250    Clinical Impression Statement  Patient is making progress towards long term goals and demonstrates improvement with 3 min walk test with ability to walk 32800ft with taking 3 standing resting breaks; this also happened with a self-reported 'stomach ache' indicating further improvement. Although patient is improving, she continues to demonstrate decreased walking speed indicating increased fall risk and patient will benefit from further skilled therapy to return to prior level of function.     Rehab Potential  Fair    Clinical Impairments Affecting Rehab Potential  (+) family support, decreased coormorbities (-) age    PT Frequency  1x / week    PT Duration  6 weeks    PT Treatment/Interventions  Balance training;Neuromuscular re-education;Gait training;Patient/family education;Therapeutic exercise;Therapeutic activities;Functional mobility training;Stair training;Moist Heat;Aquatic Therapy;Electrical Stimulation;Cryotherapy;Manual techniques    PT Next Visit Plan  Attempt more dynamic balance tasks, ambulation over obstacles, ambulation with head turns, stepping response tasks    PT Home Exercise Plan  See education    Consulted and Agree with Plan of Care  Patient       Patient will benefit from skilled therapeutic intervention in order to improve the following deficits and impairments:  Abnormal gait, Decreased coordination, Decreased endurance, Decreased activity tolerance, Decreased strength, Decreased range of  motion, Decreased balance, Difficulty walking  Visit Diagnosis: Muscle weakness (generalized)  Difficulty in walking, not elsewhere classified     Problem List Patient Active Problem List   Diagnosis Date Noted  . Atrial fibrillation (HCC) 04/26/2017  . Anemia 04/26/2017  . 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 Macular degeneration of left eye 01/06/2015  . Essential hypertension 12/25/2013  . BACK PAIN 08/02/2010    Gerri SporeWesley  Annleigh Knueppel, PT DPT 08/01/2018, 12:55 PM  [place] [facility]TS MEDICINE [address] [place], [address] Phone: [phone]   Fax:  [phone]  Name: DEZYRAE KENSINGER MRN: [id number] Date of Birth: 1924/11/04

## 2018-08-06 ENCOUNTER — Ambulatory Visit: Payer: PPO

## 2018-08-06 DIAGNOSIS — M6281 Muscle weakness (generalized): Secondary | ICD-10-CM

## 2018-08-06 DIAGNOSIS — R262 Difficulty in walking, not elsewhere classified: Secondary | ICD-10-CM

## 2018-08-06 NOTE — Therapy (Signed)
[place] [facility] MED[facility] [address], [place], [address] Phone: [phone]   Fax:  [phone]  Physical Therapy Treatment  Patient Details  Name: Paula Luna MRN: [id number] Date of Birth: November 07, 1924 Referring Provider (PT): spencer copland MD   Encounter Date: 08/06/2018  PT End of Session - 08/06/18 1719    Visit Number  40    Number of Visits  50    Date for PT Re-Evaluation  09/11/18    PT Start Time  1715    PT Stop Time  1800    PT Time Calculation (min)  45 min    Equipment Utilized During Treatment  Gait belt    Activity Tolerance  Patient tolerated treatment well    Behavior During Therapy  [facility] for tasks assessed/performed       Past Medical History:  Diagnosis Date  . Anemia   . Back pain   . Hypertension 12/25/2013  . Ischemic bowel disease (HCC)    a. 03/2017 abd pain-->internal herina and ischemic jejunum s/p ex lap and small bowel resection.  . Macular degeneration of left eye 01/06/2015  . Osteoporosis   . Persistent atrial fibrillation    a. First noted 03/2017;  b. 07/2017 Echo: EF 60-65%, no rwma, mild MR, mildly to mod dil LA; c. 07/2017 48hr Holter: persistent Afib, avg HR of 66 (range 39-140 bpm);  d. CHA2DS2VASc = 4-->Xarelto initiated 07/2017.    Past Surgical History:  Procedure Laterality Date  . CLOSED REDUCTION PROXIMAL HUMERUS FRACTURE Left 2017  . LAPAROSCOPIC SMALL BOWEL RESECTION    . LAPAROTOMY N/A 03/18/2017   Procedure: EXPLORATORY LAPAROTOMY, SMALL BOWEL RESECTION;  Surgeon: Leafy Ro, MD;  Location: [facility] ORS;  Service: General;  Laterality: N/A;  . TOTAL ABDOMINAL HYSTERECTOMY      There were no vitals filed for this visit.  Subjective Assessment - 08/06/18 1717    Subjective  Patient reports increased soreness after the previous session. Patient states increased dizziness but states the dizziness is improved with sleeping with 2 pillows instead of one.    Pertinent History  Small bowel surgery     Limitations  Standing;Walking    Patient Stated Goals  To walk without feeling of falling     Currently in Pain?  No/denies       TREATMENT  Therapeutic Exercise: Nustep in sitting -- level 7 seat level and resistance level 5- x10 min Step ups with UE support - x 10  Sit to stands with therapist support - 2 x 10  Hip extension in standing with BTB around ankle - 2 x 10  Hip abduction in standing - 2 x 10 Ambulation around gym - x100 with focus on improving speed Single leg stance - x 6 with 10sec holds  Squats in standing -  x 10    Patient demonstrates increased fatigue at the end of the session    PT Education - 08/06/18 1718    Education provided  Yes    Education Details  form/technique with exercise    Person(s) Educated  Patient    Methods  Explanation;Demonstration    Comprehension  Verbalized understanding;Returned demonstration          PT Long Term Goals - 06/28/18 0905      PT LONG TERM GOAL #1   Title  Patient will be independent with HEP focused on decreasing fall risk to continue benefits of therapy after discharge.  Baseline  Dependent with exercise performance; 01/01/2018: moderate cueing for performance. 03/28/18 min-mod cueing required; 04/30/18: min cueing required    Time  6    Period  Weeks    Status  On-going      PT LONG TERM GOAL #2   Title  Patient will improve TUG to under 12 sec to indicate singificant decrease in fall risk.     Baseline  20sec; 01/01/2018: 14sec; 02/12/2018; 11.59 sec;    Time  8    Period  Weeks    Status  Achieved      PT LONG TERM GOAL #3   Title  Patient will improve her ability to perform 10mWT to 9367m/s to indicate increased safety with walking at community ambulation    Baseline  .68467m/s; 01/01/2018: .4017m/s; 02/12/2018: .1150m/s; 03/28/18 .94 m/s; 8/19/9: testing of goals deferred to next session; 06/28/2018: .4065m/s    Time  8    Period  Weeks    Status  On-going      PT LONG  TERM GOAL #4   Title  Patient will improve 5XSTS to under 16 sec to indicate significant improvement LE functional strength and decrease fall risk.    Baseline  30sec; 01/01/2018: 12 sec    Time  8    Period  Weeks    Status  Achieved      PT LONG TERM GOAL #5   Title  Pt will ambulate 53800ft in 3 minutes to demonstrate improved endurance with walking into department stores from the parking lot.     Baseline  03/28/18: 31750ft; 04/30/18: testing of goals deferred to next session; 06/28/2018: 26450ft    Time  6    Period  Weeks    Status  On-going            Plan - 08/06/18 1735    Clinical Impression Statement  Patient demonstrates improvement with exercises with ability to perform greater standing exercises compared to previous sessions indicating funcitonal carryover between session. Patient continues to demonstrates poor balance and strength in standing. Patient will benefit from further skilled therapy to return to prior level of function.     Rehab Potential  Fair    Clinical Impairments Affecting Rehab Potential  (+) family support, decreased coormorbities (-) age    PT Frequency  1x / week    PT Duration  6 weeks    PT Treatment/Interventions  Balance training;Neuromuscular re-education;Gait training;Patient/family education;Therapeutic exercise;Therapeutic activities;Functional mobility training;Stair training;Moist Heat;Aquatic Therapy;Electrical Stimulation;Cryotherapy;Manual techniques    PT Next Visit Plan  Attempt more dynamic balance tasks, ambulation over obstacles, ambulation with head turns, stepping response tasks    PT Home Exercise Plan  See education    Consulted and Agree with Plan of Care  Patient       Patient will benefit from skilled therapeutic intervention in order to improve the following deficits and impairments:  Abnormal gait, Decreased coordination, Decreased endurance, Decreased activity tolerance, Decreased strength, Decreased range of motion, Decreased  balance, Difficulty walking  Visit Diagnosis: Muscle weakness (generalized)  Difficulty in walking, not elsewhere classified     Problem List Patient Active Problem List   Diagnosis Date Noted  . Atrial fibrillation (HCC) 04/26/2017  . Anemia 04/26/2017  . 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 Macular degeneration of left eye 01/06/2015  . Essential hypertension 12/25/2013  . BACK PAIN 08/02/2010    Myrene GalasWesley Curties Conigliaro, PT DPT 08/06/2018, 5:46 PM  [place] [facility] [facility] PHYS[facility]TS MEDICINE [address], [place], [address] Phone: [phone]   Fax:  [phone]  Name: Paula Luna MRN: [id number] Date of Birth: Dec 26, 1924

## 2018-08-14 ENCOUNTER — Ambulatory Visit: Payer: PPO | Attending: Family Medicine | Admitting: Physical Therapy

## 2018-08-14 DIAGNOSIS — M6281 Muscle weakness (generalized): Secondary | ICD-10-CM | POA: Diagnosis not present

## 2018-08-14 DIAGNOSIS — R262 Difficulty in walking, not elsewhere classified: Secondary | ICD-10-CM | POA: Diagnosis not present

## 2018-08-14 IMAGING — CT CT CTA ABD/PEL W/CM AND/OR W/O CM
2 of 9 series · 9 of 46 positions shown, 15 images · IV contrast (isovue)
Comparison: None.

CLINICAL DATA: Abdominal pain with nausea and vomiting.

EXAM:
CT ANGIOGRAPHY ABDOMEN AND PELVIS WITH CONTRAST AND WITHOUT CONTRAST
TECHNIQUE: Multidetector CT imaging of the abdomen and pelvis was performed
using the standard protocol during bolus administration of
intravenous contrast. Multiplanar reconstructed images and MIPs were
obtained and reviewed to evaluate the vascular anatomy.
CONTRAST:  100 mL Isovue 370 IV

[Series 5: axial venous · axial · portal-venous · 0.68mm/px · z∈[-1162,-817]mm · 7 of 93 slices shown, 12 images]
[im 12/93  soft-tissue]
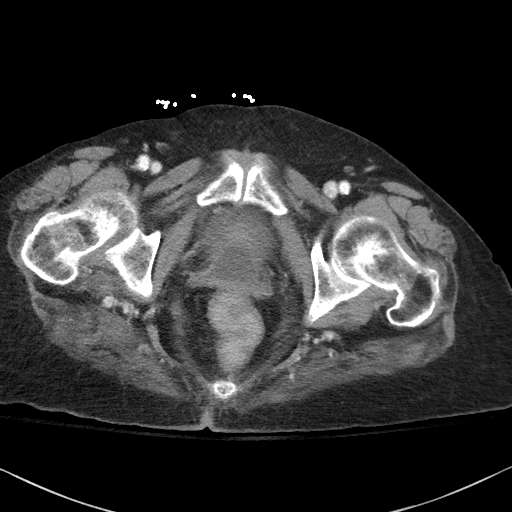
[im 12/93  bone]
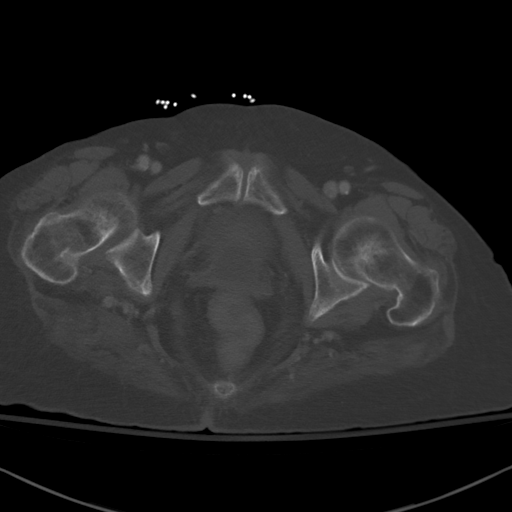
[im 24/93  soft-tissue]
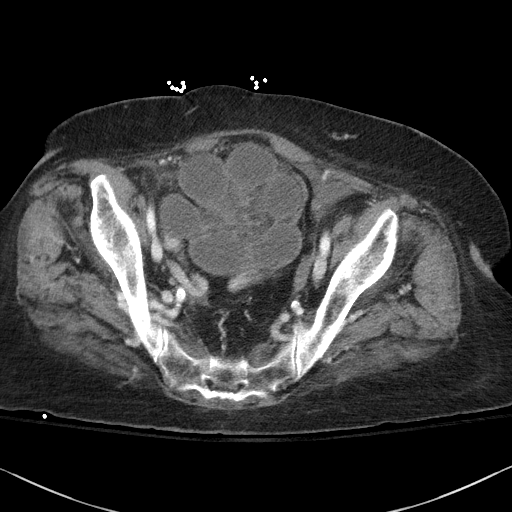
[im 35/93  soft-tissue]
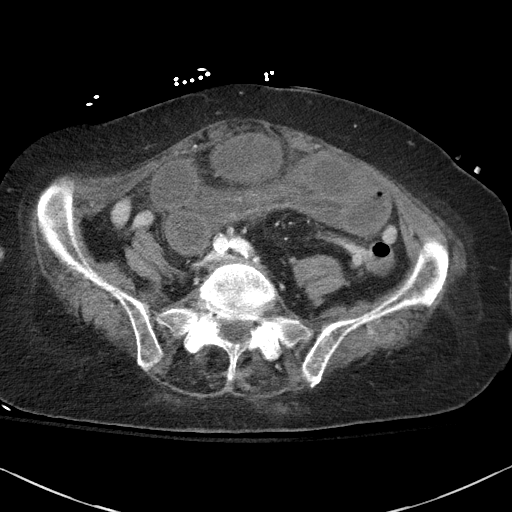
[im 47/93  soft-tissue]
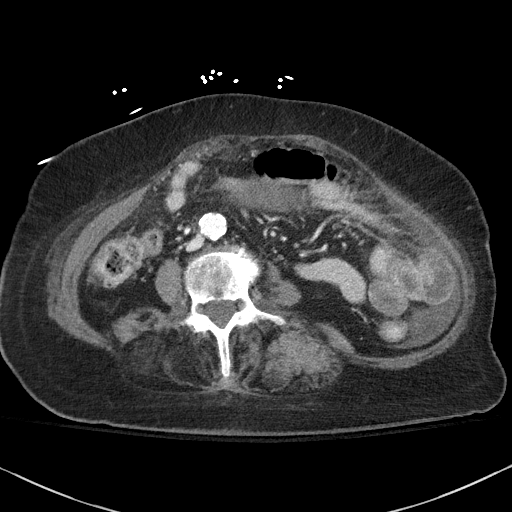
[im 47/93  lung]
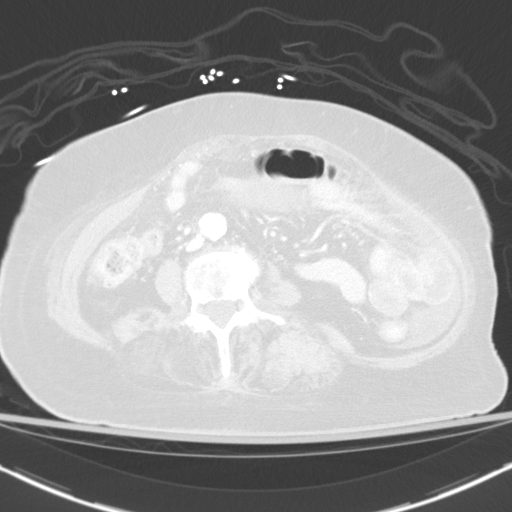
[im 58/93  soft-tissue]
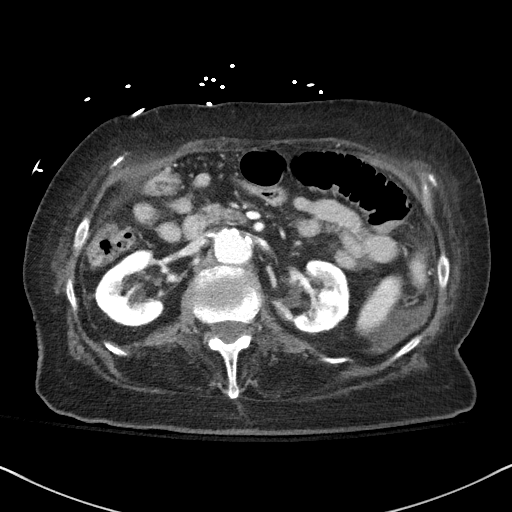
[im 58/93  lung]
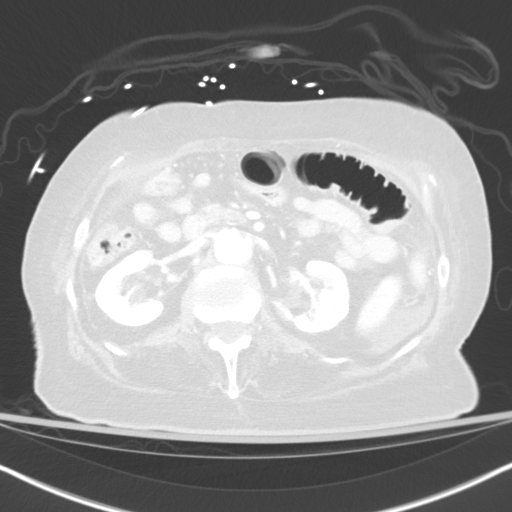
[im 70/93  soft-tissue]
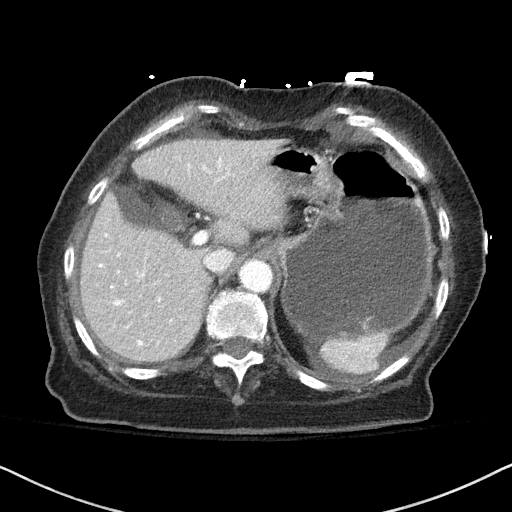
[im 70/93  lung]
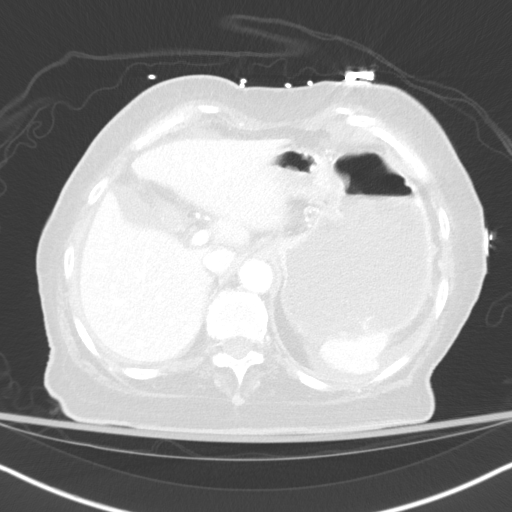
[im 81/93  soft-tissue]
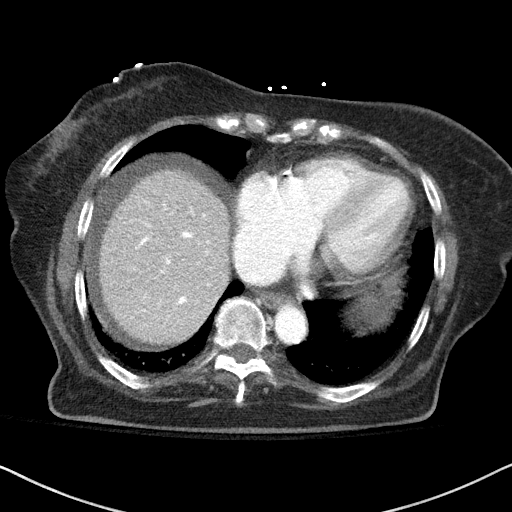
[im 81/93  lung]
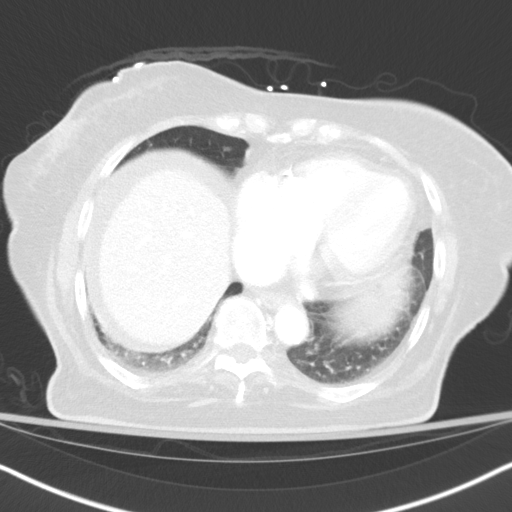

[Series 8: coronal mpr · coronal · 0.69mm/px · 2 of 116 slices shown, 3 images]
[im 39/116  soft-tissue]
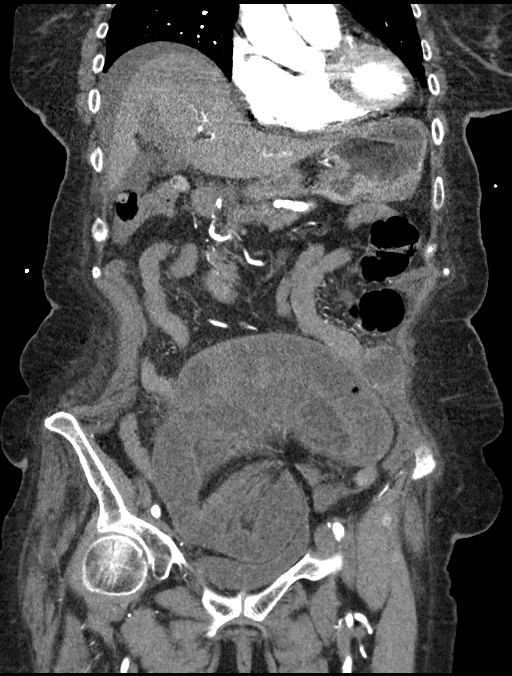
[im 39/116  bone]
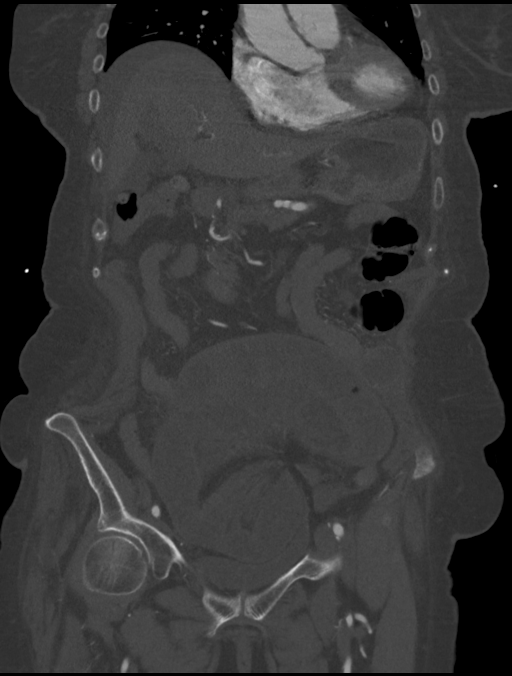
[im 77/116  soft-tissue]
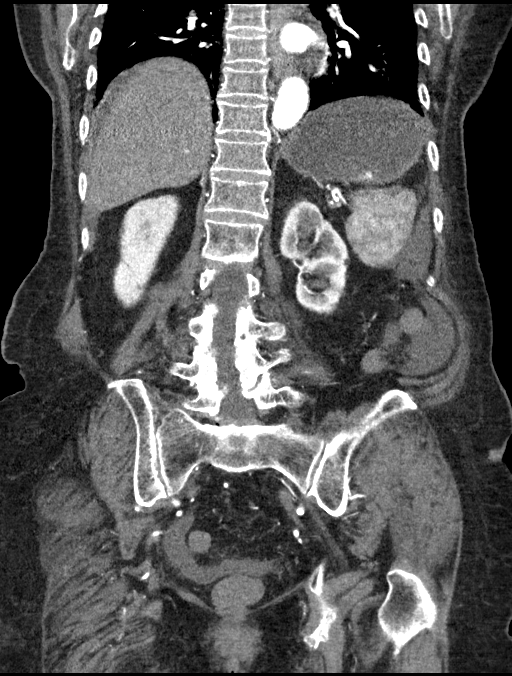

[9 of 46 positions shown; findings below may reference images not displayed]

FINDINGS: VASCULAR

Aorta: Aortic atherosclerosis noted with mild dilatation of the
abdominal aorta up to maximal caliber of 2.9 cm beginning just above
the renal arteries and continuing to a juxta-renal position. The
distal aorta is not dilated. No evidence of aortic dissection,
significant aortic thrombus or stenosis.

Celiac: No significant stenosis. Distal aspect of the celiac trunk
demonstrates mild aneurysmal dilatation and measures up to 8 mm in
diameter. Distal branches show normal patency and branching anatomy.

SMA: Normally patent.  No visualized branch vessel occlusions.

Renals: Single right renal artery demonstrates proximal calcified
plaque causing non critical stenosis of potentially up to
approximately 50%. Single left renal artery demonstrates proximal
calcified plaque likely causing significant stenosis of at least
70%. The left kidney does perfuse normally on arterial and venous
phases.

IMA: Normally patent.

Inflow: Bilateral iliac arteries show scattered plaque without
evidence of stenosis or aneurysm.

Proximal Outflow: Common femoral arteries and femoral bifurcations
show normal patency bilaterally.

Veins: Venous phase imaging shows normal patency of mesenteric
veins, portal vein and splenic vein. The IVC, iliac veins and common
femoral veins show no evidence of thrombus.

Review of the MIP images confirms the above findings.

NON-VASCULAR

Lower chest: Bibasilar atelectasis.  No pleural effusions.

Hepatobiliary: No focal liver abnormality is seen. No gallstones,
gallbladder wall thickening, or biliary dilatation.

Pancreas: Unremarkable. No pancreatic ductal dilatation or
surrounding inflammatory changes.

Spleen: Normal in size without focal abnormality.

Adrenals/Urinary Tract: Adrenal glands are unremarkable. Kidneys
without renal calculi, focal lesion, or hydronephrosis. The left
kidney demonstrates mild cortical atrophy. Bladder is unremarkable.

Stomach/Bowel: There is significant inflammation and thickening of
small bowel, especially over a segment of the distal jejunum/
proximal ileum extending into the mid to lower pelvis with
contiguous loops demonstrating severe edema. These loops are also
fluid filled and are suspicious for ischemic/ necrotic bowel.
Maximal caliber of inflamed small bowel is approximately 3 cm. Mid
jejunal loops are air filled and moderately dilated. Proximal
jejunum and distal ileum are decompressed. The colon is completely
decompressed. No free air or focal abscess identified. There is some
associated free fluid in the pelvis, paracolic gutters and around
the liver.

Lymphatic: No enlarged lymph nodes identified.

Reproductive: Status post hysterectomy. No adnexal masses.

Other: No hernias identified.

Musculoskeletal: No acute or significant osseous findings.
IMPRESSION: VASCULAR

No evidence of significant mesenteric arterial occlusive disease by
CTA or mesenteric venous thrombus. Mild aneurysmal disease of the
suprarenal/juxtarenal aorta measures 3 cm. Probable significant
proximal left renal artery stenosis.

NON-VASCULAR

Significant edema and dilatation of small bowel at the level of the
distal jejunum/proximal ileum extending into the pelvis. These loops
are fluid filled and thickened and by imaging are suspicious for
ischemic/necrotic small bowel. General surgical consultation
recommended. No associated evidence of bowel perforation or focal
abscess by CT.

These results were called by telephone at the time of interpretation
on 03/18/2017 at [DATE] to Dr. [HOSPITAL] MOATSHE , who verbally
acknowledged these results.

## 2018-08-14 NOTE — Therapy (Signed)
[place] [facility] PHYSICAL AND SPORTS MEDICINE [address] [place], [address] Phone: [phone]   Fax:  [phone]  Physical Therapy Treatment  Patient Details  Name: Paula Luna MRN: [id number] Date of Birth: [birth year] Referring Provider (PT): spencer copland MD   Encounter Date: 08/14/2018  PT End of Session - 08/14/18 1710    Visit Number  41    Number of Visits  50    Date for PT Re-Evaluation  09/11/18    PT Start Time  1700    PT Stop Time  1745    PT Time Calculation (min)  45 min    Equipment Utilized During Treatment  Gait belt    Activity Tolerance  Patient tolerated treatment well    Behavior During Therapy  [facility] for tasks assessed/performed       Past Medical History:  Diagnosis Date  . Anemia   . Back pain   . Hypertension 12/25/2013  . Ischemic bowel disease (HCC)    a. 03/2017 abd pain-->internal herina and ischemic jejunum s/p ex lap and small bowel resection.  . Macular degeneration of left eye 01/06/2015  . Osteoporosis   . Persistent atrial fibrillation    a. First noted 03/2017;  b. 07/2017 Echo: EF 60-65%, no rwma, mild MR, mildly to mod dil LA; c. 07/2017 48hr Holter: persistent Afib, avg HR of 66 (range 39-140 bpm);  d. CHA2DS2VASc = 4-->Xarelto initiated 07/2017.    Past Surgical History:  Procedure Laterality Date  . CLOSED REDUCTION PROXIMAL HUMERUS FRACTURE Left 2017  . LAPAROSCOPIC SMALL BOWEL RESECTION    . LAPAROTOMY N/A 03/18/2017   Procedure: EXPLORATORY LAPAROTOMY, SMALL BOWEL RESECTION;  Surgeon: Leafy Ro, MD;  Location: [facility] ORS;  Service: General;  Laterality: N/A;  . TOTAL ABDOMINAL HYSTERECTOMY      There were no vitals filed for this visit.  Subjective Assessment - 08/14/18 1704    Subjective  Patient states that she has no significant changes to report since her last visit and she enjoyed a nice Thanksgiving.     Pertinent History  Small bowel surgery     Limitations   Standing;Walking    Patient Stated Goals  To walk without feeling of falling     Currently in Pain?  No/denies    Pain Score  0-No pain    Multiple Pain Sites  No      TREATMENT Therapeutic Exercise: Nustep in sitting -- level 7 seat level and resistance level 5- x10 min Lateral Step ups: 4" step with UE support - x 10  Sit to stands with therapist support - 2 x 10    Hip extension in standing with BTB around ankle - 2 x 10  Hip abduction in standing - 2 x 10 Ambulation around gym - x2 laps with focus on improving speed without sacrificing accuracy. Single leg stance - x 6 with 10sec holds  Squats in standing -  2 x 10   Patient resistant to any verbal cues.   PT Education - 08/14/18 1710    Education provided  Yes    Education Details  exercise technique, balance strategies    Person(s) Educated  Patient    Methods  Explanation;Demonstration;Verbal cues    Comprehension  Returned demonstration;Need further instruction;Verbalized understanding          PT Long Term Goals - 06/28/18 0905      PT LONG TERM GOAL #1   Title  Patient will be  independent with HEP focused on decreasing fall risk to continue benefits of therapy after discharge.     Baseline  Dependent with exercise performance; 01/01/2018: moderate cueing for performance. 03/28/18 min-mod cueing required; 04/30/18: min cueing required    Time  6    Period  Weeks    Status  On-going      PT LONG TERM GOAL #2   Title  Patient will improve TUG to under 12 sec to indicate singificant decrease in fall risk.     Baseline  20sec; 01/01/2018: 14sec; 02/12/2018; 11.59 sec;    Time  8    Period  Weeks    Status  Achieved      PT LONG TERM GOAL #3   Title  Patient will improve her ability to perform 10mWT to 3034m/s to indicate increased safety with walking at community ambulation    Baseline  .10434m/s; 01/01/2018: .1777m/s; 02/12/2018: .3945m/s; 03/28/18 .94 m/s; 8/19/9: testing of goals deferred to next session; 06/28/2018: .764m/s     Time  8    Period  Weeks    Status  On-going      PT LONG TERM GOAL #4   Title  Patient will improve 5XSTS to under 16 sec to indicate significant improvement LE functional strength and decrease fall risk.    Baseline  30sec; 01/01/2018: 12 sec    Time  8    Period  Weeks    Status  Achieved      PT LONG TERM GOAL #5   Title  Pt will ambulate 59500ft in 3 minutes to demonstrate improved endurance with walking into department stores from the parking lot.     Baseline  03/28/18: 36050ft; 04/30/18: testing of goals deferred to next session; 06/28/2018: 22350ft    Time  6    Period  Weeks    Status  On-going            Plan - 08/14/18 1630    Clinical Impression Statement  Patient presents to clinic with complaints of a chest cold but is amenable to intervention. Patient continues to have deficits in balanc and strength with compensatory movement patterns apparent (knee valgus, quad dominance) during sit to stand and squats. Patient will continue to benefit from skilled therapeutic intervention in order to address deficits in order to decrease risk of falls and improve overall function.    Rehab Potential  Fair    Clinical Impairments Affecting Rehab Potential  (+) family support, decreased coormorbities (-) age    PT Frequency  1x / week    PT Duration  6 weeks    PT Treatment/Interventions  Balance training;Neuromuscular re-education;Gait training;Patient/family education;Therapeutic exercise;Therapeutic activities;Functional mobility training;Stair training;Moist Heat;Aquatic Therapy;Electrical Stimulation;Cryotherapy;Manual techniques    PT Next Visit Plan  Attempt more dynamic balance tasks, ambulation over obstacles, ambulation with head turns, stepping response tasks    PT Home Exercise Plan  See education    Consulted and Agree with Plan of Care  Patient       Patient will benefit from skilled therapeutic intervention in order to improve the following deficits and impairments:   Abnormal gait, Decreased coordination, Decreased endurance, Decreased activity tolerance, Decreased strength, Decreased range of motion, Decreased balance, Difficulty walking  Visit Diagnosis: Muscle weakness (generalized)  Difficulty in walking, not elsewhere classified     Problem List Patient Active Problem List   Diagnosis Date Noted  . Atrial fibrillation (HCC) 04/26/2017  . Anemia 04/26/2017  . 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 Macular degeneration of left eye 01/06/2015  . Essential hypertension 12/25/2013  . BACK PAIN 08/02/2010   Sheria Lang PT, DPT [phone] 08/15/2018, 4:34 PM  [place] [facility] PHYSICAL AND SPORTS MEDICINE [address], [place], [address] Phone: [phone]   Fax:  [phone]  Name: Paula Luna MRN: [id number] Date of Birth: Jan 22, 1925

## 2018-08-20 ENCOUNTER — Telehealth: Payer: Self-pay | Admitting: Internal Medicine

## 2018-08-20 NOTE — Telephone Encounter (Signed)
Please call regarding pt medication, Xarelto. States pt would like samples, states she only needs 6 pills.

## 2018-08-20 NOTE — Telephone Encounter (Signed)
Please review and let me know if you would like me to provide samples for patient.   Thanks!

## 2018-08-21 ENCOUNTER — Ambulatory Visit: Payer: PPO

## 2018-08-21 DIAGNOSIS — M6281 Muscle weakness (generalized): Secondary | ICD-10-CM

## 2018-08-21 DIAGNOSIS — R262 Difficulty in walking, not elsewhere classified: Secondary | ICD-10-CM

## 2018-08-21 NOTE — Telephone Encounter (Signed)
Medication Samples have been provided to the patient. Patient notified and aware they are placed up front for pickup.   Drug name: Xarelto      Strength: 20MG        Qty: 3 bottles  LOT: [id number]  Exp.Date: 10/21   Margrett RudBrittany N Agusta Hackenberg 9:29 AM 08/21/2018

## 2018-08-21 NOTE — Telephone Encounter (Signed)
It is fine to provide samples enough to get them through the end of the year if needed.

## 2018-08-22 ENCOUNTER — Ambulatory Visit (INDEPENDENT_AMBULATORY_CARE_PROVIDER_SITE_OTHER): Payer: PPO | Admitting: Family Medicine

## 2018-08-22 ENCOUNTER — Encounter: Payer: Self-pay | Admitting: Family Medicine

## 2018-08-22 ENCOUNTER — Ambulatory Visit: Payer: PPO | Admitting: Family Medicine

## 2018-08-22 VITALS — BP 180/90 | HR 89 | Temp 98.1°F | Ht 59.0 in | Wt 131.0 lb

## 2018-08-22 DIAGNOSIS — I10 Essential (primary) hypertension: Secondary | ICD-10-CM | POA: Diagnosis not present

## 2018-08-22 DIAGNOSIS — H3530 Unspecified macular degeneration: Secondary | ICD-10-CM | POA: Diagnosis not present

## 2018-08-22 DIAGNOSIS — I4819 Other persistent atrial fibrillation: Secondary | ICD-10-CM | POA: Diagnosis not present

## 2018-08-22 DIAGNOSIS — R42 Dizziness and giddiness: Secondary | ICD-10-CM

## 2018-08-22 LAB — HEPATIC FUNCTION PANEL
ALT: 9 U/L (ref 0–35)
AST: 18 U/L (ref 0–37)
Albumin: 4.1 g/dL (ref 3.5–5.2)
Alkaline Phosphatase: 84 U/L (ref 39–117)
Bilirubin, Direct: 0 mg/dL (ref 0.0–0.3)
Total Bilirubin: 0.4 mg/dL (ref 0.2–1.2)
Total Protein: 7 g/dL (ref 6.0–8.3)

## 2018-08-22 LAB — CBC WITH DIFFERENTIAL/PLATELET
BASOS ABS: 0 10*3/uL (ref 0.0–0.1)
BASOS PCT: 0.2 % (ref 0.0–3.0)
Eosinophils Absolute: 0.1 10*3/uL (ref 0.0–0.7)
Eosinophils Relative: 1.1 % (ref 0.0–5.0)
HCT: 41.9 % (ref 36.0–46.0)
Hemoglobin: 13.6 g/dL (ref 12.0–15.0)
Lymphocytes Relative: 32.9 % (ref 12.0–46.0)
Lymphs Abs: 2.6 10*3/uL (ref 0.7–4.0)
MCHC: 32.5 g/dL (ref 30.0–36.0)
MCV: 79.1 fl (ref 78.0–100.0)
MONOS PCT: 11 % (ref 3.0–12.0)
Monocytes Absolute: 0.9 10*3/uL (ref 0.1–1.0)
NEUTROS PCT: 54.8 % (ref 43.0–77.0)
Neutro Abs: 4.3 10*3/uL (ref 1.4–7.7)
Platelets: 207 10*3/uL (ref 150.0–400.0)
RBC: 5.29 Mil/uL — AB (ref 3.87–5.11)
RDW: 16.1 % — AB (ref 11.5–15.5)
WBC: 7.8 10*3/uL (ref 4.0–10.5)

## 2018-08-22 LAB — BASIC METABOLIC PANEL
BUN: 18 mg/dL (ref 6–23)
CALCIUM: 9.5 mg/dL (ref 8.4–10.5)
CO2: 29 meq/L (ref 19–32)
CREATININE: 0.87 mg/dL (ref 0.40–1.20)
Chloride: 103 mEq/L (ref 96–112)
GFR: 64.74 mL/min (ref >60.00)
Glucose, Bld: 93 mg/dL (ref 70–99)
Potassium: 4 mEq/L (ref 3.5–5.1)
Sodium: 139 mEq/L (ref 135–145)

## 2018-08-22 MED ORDER — FLUTICASONE PROPIONATE 50 MCG/ACT NA SUSP: 2.0000 | Freq: Every day | NASAL | 3 refills | Status: DC

## 2018-08-22 MED ORDER — METOPROLOL SUCCINATE ER 25 MG PO TB24: 25.0000 mg | ORAL_TABLET | Freq: Every day | ORAL | 3 refills | Status: DC

## 2018-08-22 NOTE — Progress Notes (Signed)
Dr. Karleen Hampshire T. Talal Fritchman, MD, CAQ Sports Medicine Primary Care and Sports Medicine [address] [place], [address] Phone: [phone] Fax: [phone]  08/22/2018  Patient: Paula Luna, MRN: [id number], DOB: [birth year], 84 y.o.  Primary Physician:  Hannah Beat, MD   Chief Complaint  Patient presents with  . Follow-up    6 month  . Dizziness   Subjective:   Paula Luna is a 84 y.o. very pleasant female patient who presents with the following:  HTN: Tolerating all medications without side effects BP is high today No CP, no sob. Some dizziness and dizziness with movement.  BP Readings from Last 3 Encounters:  08/22/18 (!) 180/90  06/20/18 (!) 160/90  05/31/18 (!) 146/74   155/85 on recheck  Basic Metabolic Panel:    Component Value Date/Time   NA 139 08/22/2018 1309   NA 144 06/27/2017 1444   K 4.0 08/22/2018 1309   CL 103 08/22/2018 1309   CO2 29 08/22/2018 1309   BUN 18 08/22/2018 1309   BUN 13 06/27/2017 1444   CREATININE 0.87 08/22/2018 1309   GLUCOSE 93 08/22/2018 1309   CALCIUM 9.5 08/22/2018 1309    A fib, currently on Xarelto.  No b-blocker or ca-channel blocker  Also having a lot of problems with her vision  Past Medical History, Surgical History, Social History, Family History, Problem List, Medications, and Allergies have been reviewed and updated if relevant.  Patient Active Problem List   Diagnosis Date Noted  . Atrial fibrillation (HCC) 04/26/2017  . Anemia 04/26/2017  . 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 Macular degeneration of left eye 01/06/2015  . Essential hypertension 12/25/2013  . BACK PAIN 08/02/2010    Past Medical History:  Diagnosis Date  . Anemia   . Back pain   . Hypertension 12/25/2013  . Ischemic bowel disease (HCC)    a. 03/2017 abd pain-->internal herina and ischemic jejunum s/p ex lap and small bowel resection.  . Macular degeneration of left eye  01/06/2015  . Osteoporosis   . Persistent atrial fibrillation    a. First noted 03/2017;  b. 07/2017 Echo: EF 60-65%, no rwma, mild MR, mildly to mod dil LA; c. 07/2017 48hr Holter: persistent Afib, avg HR of 66 (range 39-140 bpm);  d. CHA2DS2VASc = 4-->Xarelto initiated 07/2017.    Past Surgical History:  Procedure Laterality Date  . CLOSED REDUCTION PROXIMAL HUMERUS FRACTURE Left 2017  . LAPAROSCOPIC SMALL BOWEL RESECTION    . LAPAROTOMY N/A 03/18/2017   Procedure: EXPLORATORY LAPAROTOMY, SMALL BOWEL RESECTION;  Surgeon: Leafy Ro, MD;  Location: [facility] ORS;  Service: General;  Laterality: N/A;  . TOTAL ABDOMINAL HYSTERECTOMY      Social History   Socioeconomic History  . Marital status: Married    Spouse name: Not on file  . Number of children: Not on file  . Years of education: Not on file  . Highest education level: Not on file  Occupational History  . Occupation: Musician: [id number]    Comment: 3 days a week  Social Needs  . Financial resource strain: Not on file  . Food insecurity:    Worry: Not on file    Inability: Not on file  . Transportation needs:    Medical: Not on file    Non-medical: Not on file  Tobacco Use  . Smoking status: Former Games developer  . Smokeless tobacco: Never Used  Substance and Sexual Activity  . Alcohol use: No  . Drug use: No  . Sexual activity: Never  Lifestyle  . Physical activity:    Days per week: Not on file    Minutes per session: Not on file  . Stress: Not on file  Relationships  . Social connections:    Talks on phone: Not on file    Gets together: Not on file    Attends religious service: Not on file    Active member of club or organization: Not on file    Attends meetings of clubs or organizations: Not on file    Relationship status: Not on file  . Intimate partner violence:    Fear of current or ex partner: Not on file    Emotionally abused: Not on file    Physically abused: Not on file    Forced sexual  activity: Not on file  Other Topics Concern  . Not on file  Social History Narrative   Widowed after 64 years   Working some at [employer] in [employer] with daughter    Family History  Problem Relation Age of Onset  . Diabetes Mother   . Cancer Father        jaw    Allergies  Allergen Reactions  . Latex Rash  . Tetanus Toxoid Anaphylaxis    REACTION: anaphylaxis  . Alendronate Sodium     REACTION: passed out    Medication list reviewed and updated in full in [facility].   GEN: No acute illnesses, no fevers, chills. GI: No n/v/d, eating normally Pulm: No SOB Interactive and getting along well at home.  Otherwise, ROS is as per the HPI.  Objective:   Vitals:   08/22/18 1205  BP: (!) 180/90  Pulse: 89  Temp: 98.1 F (36.7 C)  TempSrc: Oral  SpO2: 98%  Weight: 131 lb (59.4 kg)  Height: 4\' 11"  (1.499 m)    Orthostatic VS for the past 24 hrs:  BP- Lying Pulse- Lying BP- Sitting Pulse- Sitting BP- Standing at 0 minutes Pulse- Standing at 0 minutes  08/22/18 1241 (!) 177/94 58 (!) 171/91 71 (!) 239/137 67      GEN: WDWN, NAD, Non-toxic, A & O x 3 HEENT: Atraumatic, Normocephalic. Neck supple. No masses, No LAD. Ears and Nose: No external deformity. CV: irreg, irreg, No M/G/R. No JVD. No thrill.  PULM: CTA B, no wheezes, crackles, rhonchi. No retractions. No resp. distress. No accessory muscle use. EXTR: No c/c/e NEURO Normal gait.  PSYCH: Normally interactive. Conversant. Not depressed or anxious appearing.  Calm demeanor.   Laboratory and Imaging Data:  Assessment and Plan:   Dizziness - Plan: Basic metabolic panel, CBC with Differential/Platelet, Hepatic function panel  Persistent atrial fibrillation  Essential hypertension  Macular degeneration of left eye, unspecified type  BP is quite high - I suspect dizziness is coming from this Continues to be in AF - I am going to add a beta blocker to hopefully help with  both  Some dizziness could be oculovestibular given eye changes  Follow-up: Return in about 1 month (around 09/22/2018).  Meds ordered this encounter  Medications  . fluticasone (FLONASE) 50 MCG/ACT nasal spray    Sig: Place 2 sprays into both nostrils daily.    Dispense:  16 g    Refill:  3  . metoprolol succinate (TOPROL-XL) 25 MG 24 hr tablet    Sig: Take 1 tablet (25  mg total) by mouth daily.    Dispense:  30 tablet    Refill:  3   Orders Placed This Encounter  Procedures  . Basic metabolic panel  . CBC with Differential/Platelet  . Hepatic function panel    Signed,  Karleen HampshireSpencer T. Equan Cogbill, MD   Outpatient Encounter Medications as of 08/22/2018  Medication Sig  . docusate sodium (COLACE) 100 MG capsule Take 100 mg by mouth daily.   . fluticasone (FLONASE) 50 MCG/ACT nasal spray Place 2 sprays into both nostrils daily.  Marland Kitchen. lisinopril (PRINIVIL,ZESTRIL) 20 MG tablet Take 1 tablet (20 mg total) by mouth daily.  . Multiple Vitamins-Minerals (ICAPS) CAPS Take 1 capsule by mouth 2 (two) times daily.   . NON FORMULARY Vitamin D 50 mcg, take one daily by mouth  . rivaroxaban (XARELTO) 20 MG TABS tablet Take 1 tablet (20 mg total) by mouth daily with supper.  . [DISCONTINUED] fluticasone (FLONASE) 50 MCG/ACT nasal spray Place 2 sprays into both nostrils daily.  . metoprolol succinate (TOPROL-XL) 25 MG 24 hr tablet Take 1 tablet (25 mg total) by mouth daily.   No facility-administered encounter medications on file as of 08/22/2018.

## 2018-08-22 NOTE — Therapy (Signed)
[facility] PH[facility] MEDICINE [address] [place], [place], [address] Phone: [phone]   Fax:  [phone]  Physical Therapy Treatment  Patient Details  Name: Paula CornsRoxanne D Luna MRN: [id number] Date of Birth: Nov 26, 1924 Referring Provider (PT): spencer copland MD   Encounter Date: 08/21/2018  PT End of Session - 08/21/18 1704    Visit Number  42    Number of Visits  50    Date for PT Re-Evaluation  09/11/18    PT Start Time  1700    PT Stop Time  1745    PT Time Calculation (min)  45 min    Equipment Utilized During Treatment  Gait belt    Activity Tolerance  Patient tolerated treatment well    Behavior During Therapy  [facility] for tasks assessed/performed       Past Medical History:  Diagnosis Date  . Anemia   . Back pain   . Hypertension 12/25/2013  . Ischemic bowel disease (HCC)    a. 03/2017 abd pain-->internal herina and ischemic jejunum s/p ex lap and small bowel resection.  . Macular degeneration of left eye 01/06/2015  . Osteoporosis   . Persistent atrial fibrillation    a. First noted 03/2017;  b. 07/2017 Echo: EF 60-65%, no rwma, mild MR, mildly to mod dil LA; c. 07/2017 48hr Holter: persistent Afib, avg HR of 66 (range 39-140 bpm);  d. CHA2DS2VASc = 4-->Xarelto initiated 07/2017.    Past Surgical History:  Procedure Laterality Date  . CLOSED REDUCTION PROXIMAL HUMERUS FRACTURE Left 2017  . LAPAROSCOPIC SMALL BOWEL RESECTION    . LAPAROTOMY N/A 03/18/2017   Procedure: EXPLORATORY LAPAROTOMY, SMALL BOWEL RESECTION;  Surgeon: Leafy RoPabon, Diego F, MD;  Location: [facility] ORS;  Service: General;  Laterality: N/A;  . TOTAL ABDOMINAL HYSTERECTOMY      There were no vitals filed for this visit.  Subjective Assessment - 08/21/18 1702    Subjective  Patient reports no major changes since the previous session. Patient states improvement overall with therapy.     Pertinent History  Small bowel surgery     Limitations  Standing;Walking     Patient Stated Goals  To walk without feeling of falling     Currently in Pain?  No/denies       TREATMENT Therapeutic Exercise: Nustep in sitting -- level 7 seat level and resistance level 5- x10 min Ambulation around gym - x150 ft  Asending/desending stairs - x4 up and down (4-steps) Hip abduction in standing - 2 x 10 Squats in standing -  2 x 10 Hip extension in standing - x10 Stepping over hurdles - x 10 (3 hurdles) Side stepping over hurdles sideways - x 8 (3 hurdles) High knee standing - x 10 B   Patient resistant to any verbal cues.   PT Education - 08/21/18 1703    Education provided  Yes    Education Details  form/technique with exercise    Person(s) Educated  Patient    Methods  Explanation;Demonstration    Comprehension  Verbalized understanding;Returned demonstration          PT Long Term Goals - 06/28/18 0905      PT LONG TERM GOAL #1   Title  Patient will be independent with HEP focused on decreasing fall risk to continue benefits of therapy after discharge.     Baseline  Dependent with exercise performance; 01/01/2018: moderate cueing for performance. 03/28/18 min-mod cueing required; 04/30/18: min cueing required  Time  6    Period  Weeks    Status  On-going      PT LONG TERM GOAL #2   Title  Patient will improve TUG to under 12 sec to indicate singificant decrease in fall risk.     Baseline  20sec; 01/01/2018: 14sec; 02/12/2018; 11.59 sec;    Time  8    Period  Weeks    Status  Achieved      PT LONG TERM GOAL #3   Title  Patient will improve her ability to perform to 6m/s to indicate increased safety with walking at community ambulation    Baseline  .36m/s; 01/01/2018: .47m/s; 02/12/2018: .80m/s; 03/28/18 .94 m/s; 8/19/9: testing of goals deferred to next session; 06/28/2018: .67m/s    Time  8    Period  Weeks    Status  On-going      PT LONG TERM GOAL #4   Title  Patient will improve 5XSTS to under 16 sec to indicate significant improvement LE  functional strength and decrease fall risk.    Baseline  30sec; 01/01/2018: 12 sec    Time  8    Period  Weeks    Status  Achieved      PT LONG TERM GOAL #5   Title  Pt will ambulate 599ft in 3 minutes to demonstrate improved endurance with walking into department stores from the parking lot.     Baseline  03/28/18: 333ft; 04/30/18: testing of goals deferred to next session; 06/28/2018: 259ft    Time  6    Period  Weeks    Status  On-going            Plan - 08/21/18 1742    Clinical Impression Statement  Patient demonstrates improvement with exercises with abiltity to perform gretaer amount of reps today versus previous sessions. Although patient is improving, she continues to have increased difficulty with prolonged walking. Patient will benefit from further skilled therapy to return to prior level of function.     Rehab Potential  Fair    Clinical Impairments Affecting Rehab Potential  (+) family support, decreased coormorbities (-) age    PT Frequency  1x / week    PT Duration  6 weeks    PT Treatment/Interventions  Balance training;Neuromuscular re-education;Gait training;Patient/family education;Therapeutic exercise;Therapeutic activities;Functional mobility training;Stair training;Moist Heat;Aquatic Therapy;Electrical Stimulation;Cryotherapy;Manual techniques    PT Next Visit Plan  Attempt more dynamic balance tasks, ambulation over obstacles, ambulation with head turns, stepping response tasks    PT Home Exercise Plan  See education    Consulted and Agree with Plan of Care  Patient       Patient will benefit from skilled therapeutic intervention in order to improve the following deficits and impairments:  Abnormal gait, Decreased coordination, Decreased endurance, Decreased activity tolerance, Decreased strength, Decreased range of motion, Decreased balance, Difficulty walking  Visit Diagnosis: Difficulty in walking, not elsewhere classified  Muscle weakness  (generalized)     Problem List Patient Active Problem List   Diagnosis Date Noted  . Atrial fibrillation (HCC) 04/26/2017  . Anemia 04/26/2017  . 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 Macular degeneration of left eye 01/06/2015  . Essential hypertension 12/25/2013  . BACK PAIN 08/02/2010    Paula Luna, PT DPT 08/22/2018, 12:23 PM  [facility] PHYSICAL AND SPORTS MEDICINE [address], [place], [address] Phone: [phone]   Fax:  [phone]  Name: Paula Luna MRN: [id number] Date of Birth: 02/02/1925

## 2018-08-23 ENCOUNTER — Encounter: Payer: Self-pay | Admitting: Family Medicine

## 2018-08-24 ENCOUNTER — Telehealth: Payer: Self-pay | Admitting: Internal Medicine

## 2018-08-24 NOTE — Telephone Encounter (Signed)
I spoke with the patient's daughter, Dewayne Hatchnn (ok per DPR). She states the patient recently saw Dr. Dallas Schimkeopeland for a 6 month follow up. Per Dr. Dallas Schimkeopeland, the patient's BP was elevated in clinic- reported to [facility] as a SBP >200 mmHg.  Dr. Dallas Schimkeopeland started the patient on metoprolol succinate 25 mg once daily in addition to her current dose of lisinopril 20 mg once daily. Per Dewayne HatchAnn, she did check the patient's blood pressure last night with an old cuff and her SBP was 160 mmHg. Dewayne Hatchnn has ordered a new BP cuff and is expecting this on Tuesday. Confirmed with Dewayne HatchAnn when the patient takes her BP medicaitons- this done in the evening as the lisinopril makes the patient sleepy. They have added metoprolol to her night time regimen.   I have advised [name] to continue to monitor the patient's BP maybe twice daily- once in the AM after she gets up and eats breakfast and once in the PM about 30 minutes after the patient takes her medications.  Dewayne Hatchnn is aware I will forward this to Dr. Okey DupreEnd to update him on the change Dr. Dallas Schimkeopeland has made.

## 2018-08-24 NOTE — Telephone Encounter (Signed)
Patient daughter Dewayne Hatchnn calling Wanted to make office aware that patient saw her PCP Dr Patsy Lageropland this week Patient's BP was extremely high, over 200 so patient was placed on metoprolol in addition to her other medication Please call with any questions or concerns

## 2018-08-28 ENCOUNTER — Ambulatory Visit: Payer: PPO

## 2018-08-28 DIAGNOSIS — R262 Difficulty in walking, not elsewhere classified: Secondary | ICD-10-CM

## 2018-08-28 DIAGNOSIS — M6281 Muscle weakness (generalized): Secondary | ICD-10-CM | POA: Diagnosis not present

## 2018-08-28 NOTE — Telephone Encounter (Signed)
I called and spoke with the patient's daughter, Paula Luna (ok per [facility]).  I have advised her of Dr. Serita KyleEnd's recommendations that he is agreeable with Dr. Durel Saltsopeland's decision regarding her BP medication. Paula Luna is aware to continue to monitor the patient's BP and call our office/ Dr. Dallas Schimkeopeland if the readings are consistently >140/90. Paula Luna states she has been getting variable readings with her old BP cuff, but she does expect the new cuff to arrive today. I have asked that she check the patient's BP with the new cuff for about 5 days and call if the readings are still elevated at 140/90 or greater.  Paula Luna is agreeable and voices understanding.

## 2018-08-28 NOTE — Telephone Encounter (Signed)
That is reasonable.  If BP remains consistent above 140/90, she should contact us or Dr. Patsy Lageropland to discuss further medication changes.  Yvonne Kendallhristopher Anayah Arvanitis, MD [facility] Pager: [phone]

## 2018-08-29 NOTE — Therapy (Signed)
[facility] PHYSICAL [facility] SPORTS MEDICINE [address], [place], [address] Phone: [phone]   Fax:  [phone]  Physical Therapy Treatment  Patient Details  Name: Paula Luna MRN: [id number] Date of Birth: [birth year] Referring Provider (PT): spencer copland MD   Encounter Date: 08/28/2018  PT End of Session - 08/28/18 1718    Visit Number  43    Number of Visits  50    Date for PT Re-Evaluation  09/11/18    PT Start Time  1700    PT Stop Time  1745    PT Time Calculation (min)  45 min    Equipment Utilized During Treatment  Gait belt    Activity Tolerance  Patient tolerated treatment well    Behavior During Therapy  [facility] for tasks assessed/performed       Past Medical History:  Diagnosis Date  . Anemia   . Back pain   . Hypertension 12/25/2013  . Ischemic bowel disease (HCC)    a. 03/2017 abd pain-->internal herina and ischemic jejunum s/p ex lap and small bowel resection.  . Macular degeneration of left eye 01/06/2015  . Osteoporosis   . Persistent atrial fibrillation    a. First noted 03/2017;  b. 07/2017 Echo: EF 60-65%, no rwma, mild MR, mildly to mod dil LA; c. 07/2017 48hr Holter: persistent Afib, avg HR of 66 (range 39-140 bpm);  d. CHA2DS2VASc = 4-->Xarelto initiated 07/2017.    Past Surgical History:  Procedure Laterality Date  . CLOSED REDUCTION PROXIMAL HUMERUS FRACTURE Left 2017  . LAPAROSCOPIC SMALL BOWEL RESECTION    . LAPAROTOMY N/A 03/18/2017   Procedure: EXPLORATORY LAPAROTOMY, SMALL BOWEL RESECTION;  Surgeon: Leafy Ro, MD;  Location: [facility] ORS;  Service: General;  Laterality: N/A;  . TOTAL ABDOMINAL HYSTERECTOMY      There were no vitals filed for this visit.  Subjective Assessment - 08/28/18 1713    Subjective  Patient reports she doesn't feel well today. Patient states she had a lazy day and reprots the weather has been making her feel depressed.     Pertinent History  Small bowel surgery     Limitations  Standing;Walking    Patient Stated Goals  To walk without feeling of falling     Currently in Pain?  No/denies       TREATMENT Therapeutic Exercise: Nustep in sitting -- level 7 seat level and resistance level 5- x8 min Sit to stands in the chair with therapist support Four square stepping - 8 ft each leg x 5 Stairs up and down - x 5 with B UE support Hip abduction in standing -  x 10 Squats in standing -   x 10 Hip extension in standing - x10 Standing hip external rotation - x 10    Patient demonstrates increased fatigue at the end of the session.    PT Education - 08/28/18 1718    Education provided  Yes    Education Details  form/technique with exercise    Person(s) Educated  Patient    Methods  Explanation;Demonstration    Comprehension  Verbalized understanding;Returned demonstration          PT Long Term Goals - 06/28/18 0905      PT LONG TERM GOAL #1   Title  Patient will be independent with HEP focused on decreasing fall risk to continue benefits of therapy after discharge.     Baseline  Dependent with exercise performance; 01/01/2018: moderate cueing for  performance. 03/28/18 min-mod cueing required; 04/30/18: min cueing required    Time  6    Period  Weeks    Status  On-going      PT LONG TERM GOAL #2   Title  Patient will improve TUG to under 12 sec to indicate singificant decrease in fall risk.     Baseline  20sec; 01/01/2018: 14sec; 02/12/2018; 11.59 sec;    Time  8    Period  Weeks    Status  Achieved      PT LONG TERM GOAL #3   Title  Patient will improve her ability to perform 10mWT to 3476m/s to indicate increased safety with walking at community ambulation    Baseline  .36476m/s; 01/01/2018: .1813m/s; 02/12/2018: .5950m/s; 03/28/18 .94 m/s; 8/19/9: testing of goals deferred to next session; 06/28/2018: .5810m/s    Time  8    Period  Weeks    Status  On-going      PT LONG TERM GOAL #4   Title  Patient will improve 5XSTS to under 16 sec to indicate  significant improvement LE functional strength and decrease fall risk.    Baseline  30sec; 01/01/2018: 12 sec    Time  8    Period  Weeks    Status  Achieved      PT LONG TERM GOAL #5   Title  Pt will ambulate 5500ft in 3 minutes to demonstrate improved endurance with walking into department stores from the parking lot.     Baseline  03/28/18: 36150ft; 04/30/18: testing of goals deferred to next session; 06/28/2018: 24350ft    Time  6    Period  Weeks    Status  On-going            Plan - 08/29/18 [id number]    Clinical Impression Statement  Patient demonstrates increased fatigue with exercise today and performed overall less standing exercises compared to previous sessions. Patient reports not feeling well which is most likley due to decreased performance today. Patient will benefit from further skilled therapy to return to prior level of function.      Rehab Potential  Fair    Clinical Impairments Affecting Rehab Potential  (+) family support, decreased coormorbities (-) age    PT Frequency  1x / week    PT Duration  6 weeks    PT Treatment/Interventions  Balance training;Neuromuscular re-education;Gait training;Patient/family education;Therapeutic exercise;Therapeutic activities;Functional mobility training;Stair training;Moist Heat;Aquatic Therapy;Electrical Stimulation;Cryotherapy;Manual techniques    PT Next Visit Plan  Attempt more dynamic balance tasks, ambulation over obstacles, ambulation with head turns, stepping response tasks    PT Home Exercise Plan  See education    Consulted and Agree with Plan of Care  Patient       Patient will benefit from skilled therapeutic intervention in order to improve the following deficits and impairments:  Abnormal gait, Decreased coordination, Decreased endurance, Decreased activity tolerance, Decreased strength, Decreased range of motion, Decreased balance, Difficulty walking  Visit Diagnosis: Muscle weakness (generalized)  Difficulty in walking,  not elsewhere classified     Problem List Patient Active Problem List   Diagnosis Date Noted  . Atrial fibrillation (HCC) 04/26/2017  . Anemia 04/26/2017  . 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 Macular degeneration of left eye 01/06/2015  . Essential hypertension 12/25/2013  . BACK PAIN 08/02/2010    Myrene GalasWesley Syerra Abdelrahman, PT DPT 08/29/2018, 9:32 AM  [facility] PHYSICAL [facility] SPORTS  MEDICINE [address] [place], [address] Phone: [phone]   Fax:  [phone]  Name: Paula Luna MRN: [id number] Date of Birth: 11-02-1924

## 2018-09-07 ENCOUNTER — Telehealth: Payer: Self-pay | Admitting: *Deleted

## 2018-09-07 NOTE — Telephone Encounter (Signed)
Spoke to pts daughter, who states she was advised to keep check on pts BP, since the addition of metoprolol. She states that her mother's BP had been running 150s/70-80s, but their BP machine recently broke and she has been unable to check it on a regular basis. Pt did c/o some dizziness, but Dewayne Hatchnn is unsure if it was BP related. She knows she can take pt to [facility] and have readings checked but the machine is in the back of the store and pts BP is sometimes up after walking so far. I advised her that [facility] sometimes has their machines at the front near the pharmacy; daughter will take pt there and if high contact office back and schedule OV

## 2018-09-11 ENCOUNTER — Ambulatory Visit: Payer: PPO

## 2018-09-14 ENCOUNTER — Telehealth: Payer: Self-pay | Admitting: Internal Medicine

## 2018-09-14 NOTE — Telephone Encounter (Signed)
Pt c/o BP issue: STAT if pt c/o blurred vision, one-sided weakness or slurred speech  1. What are your last 5 BP readings?   12/28 -1-40/60 12/29 140/60 12/30-145/70 1/2/-158/76 Today -   2. Are you having any other symptoms (ex. Dizziness, headache, blurred vision, passed out)? No other symptoms  3. What is your BP issue? elevated

## 2018-09-16 DIAGNOSIS — J029 Acute pharyngitis, unspecified: Secondary | ICD-10-CM | POA: Diagnosis not present

## 2018-09-17 ENCOUNTER — Telehealth: Payer: Self-pay

## 2018-09-17 ENCOUNTER — Other Ambulatory Visit: Payer: Self-pay | Admitting: Internal Medicine

## 2018-09-17 NOTE — Telephone Encounter (Signed)
Spoke with patient's daughter. She verbalized understanding to contact Dr Patsy Lager for further advice. Patient has appointment on Monday and she will continue to monitor BP.

## 2018-09-17 NOTE — Telephone Encounter (Signed)
Left message for Paula Luna that Dr. Patsy Lager got her message and just wanted me to call and reassure her that those are not urgent BP readings.  Advised to push fluids.  Use tylenol and chloraseptic spray for ST.  We will recheck her mom in 1 week at 1/13 appt.

## 2018-09-17 NOTE — Telephone Encounter (Signed)
Please reassure her - those are not urgent BP readings. Fluids, tylenol, chloraseptic for ST.  We will recheck in 1 week at 1/13 appt

## 2018-09-17 NOTE — Telephone Encounter (Signed)
S/w patient's daughter, ok per DPR. In first entry are patient's BP readings as follows that she was calling to update [place] with last Friday: 12/28 -1-40/60 12/29 140/60 12/30-145/70 1/2/-158/76   Starting Saturday, patient's BP was elevated. Readings from a new automatic BP were: 202/94, HR 61 184/92, HR 83 193/90, HR 61  On Sunday, daughter had EMS come to check BP and it was 144/75 and 138/78 manually by EMS. Home automatic cuff showed 167/87, HR 82 around the same time after they came. Denies chest pain, headache, shortness of breath or blurred vision.  Patient has also been complaining of sore throat since Saturday. They've been doing warm tea and honey as well as chloraseptic spray.  She also has a call into the PCP, Dr Patsy Lager, to address the sore throat.  I advised that if she continues to complain of sore throat then PCP is the place she should call or make an appointment for evaluation of that.   She called [employer] who suggested patient could be enrolled in the "White Coat Syndrome HTN" by filling out the form and patient could receive a free BP cuff. Advised I will see if Dr End is willing to complete the form.  I called [employer] and spoke with representative about this. It appears they suggested patient to have [place] fill our a prior authorization request form and put on it the ICD-10 code and CPT code for Ambulatory blood pressure monitoring device.  Will route to Dr End if he is willing to pursue.   Advised her to continue to monitor BP/HR over the next few days and call us with the readings. We discussed taking it once or no more than 2 times a day unless she has symptoms. Advised to take it about the same time each day. She verbalized understanding and will let us know.  Routing to Dr End for review.

## 2018-09-17 NOTE — Telephone Encounter (Signed)
[name] (DPR signed) left v/m pt was having ST and losing voice; pts BP running high with BP cuff. EMS was called and BP was 144/75 and 138/78. Today with pts cuff BP 164/85. I spoke with Dewayne HatchAnn pt started with ST and losing voice on 09/15/18. Has used chloroseptic x 2 on 09/16/18. Pt also drinking tea and honey. And pt told Dewayne Hatchnn the throat is not hurting today but pt hoarse. Dewayne Hatchnn said she is presently on phone speaking with cardiology about pts BP. Dewayne Hatchnn will cb if pt needs appt. FYI to Dr Patsy Lageropland.

## 2018-09-17 NOTE — Telephone Encounter (Signed)
Please review for refill, Thanks !  

## 2018-09-17 NOTE — Telephone Encounter (Signed)
I will defer forms to Dr. Patsy Lager, as he saw her most recently and adjusted her antihypertensive regimen.  Yvonne Kendall, MD [facility] Pager: [phone]

## 2018-09-18 ENCOUNTER — Ambulatory Visit: Payer: PPO | Admitting: Family Medicine

## 2018-09-22 NOTE — Progress Notes (Signed)
Dr. Karleen Hampshire T. Chelsie Burel, MD, CAQ Sports Medicine Primary Care and Sports Medicine [address] [place], [address] Phone: [phone] Fax: [phone]  09/24/2018  Patient: Paula Luna, MRN: [id number], DOB: 11/05/1924, 85 y.o.  Primary Physician:  Hannah Beat, MD   Chief Complaint  Patient presents with  . Follow-up   Subjective:   Paula Luna is a 85 y.o. very pleasant female patient who presents with the following:  85 yo f/u HTN - she is on low dose b-clocker and lisinopril 20 mg. She and her daughter have called in a few times recently with questions about this to both me and cardiology.  BP is 138/84 today.  Wt Readings from Last 3 Encounters:  09/24/18 129 lb (58.5 kg)  08/22/18 131 lb (59.4 kg)  06/20/18 131 lb 4 oz (59.5 kg)    BP Readings from Last 3 Encounters:  09/24/18 138/84  08/22/18 (!) 180/90  06/20/18 (!) 160/90    They have been checking her BP several times a day.  She also currently has had a cough for about a week.  Past Medical History, Surgical History, Social History, Family History, Problem List, Medications, and Allergies have been reviewed and updated if relevant.  Patient Active Problem List   Diagnosis Date Noted  . Atrial fibrillation (HCC) 04/26/2017  . Anemia 04/26/2017  . 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 Macular degeneration of left eye 01/06/2015  . Essential hypertension 12/25/2013  . BACK PAIN 08/02/2010    Past Medical History:  Diagnosis Date  . Anemia   . Back pain   . Hypertension 12/25/2013  . Ischemic bowel disease (HCC)    a. 03/2017 abd pain-->internal herina and ischemic jejunum s/p ex lap and small bowel resection.  . Macular degeneration of left eye 01/06/2015  . Osteoporosis   . Persistent atrial fibrillation    a. First noted 03/2017;  b. 07/2017 Echo: EF 60-65%, no rwma, mild MR, mildly to mod dil LA; c. 07/2017 48hr Holter: persistent  Afib, avg HR of 66 (range 39-140 bpm);  d. CHA2DS2VASc = 4-->Xarelto initiated 07/2017.    Past Surgical History:  Procedure Laterality Date  . CLOSED REDUCTION PROXIMAL HUMERUS FRACTURE Left 2017  . LAPAROSCOPIC SMALL BOWEL RESECTION    . LAPAROTOMY N/A 03/18/2017   Procedure: EXPLORATORY LAPAROTOMY, SMALL BOWEL RESECTION;  Surgeon: Leafy Ro, MD;  Location: [facility] ORS;  Service: General;  Laterality: N/A;  . TOTAL ABDOMINAL HYSTERECTOMY      Social History   Socioeconomic History  . Marital status: Married    Spouse name: Not on file  . Number of children: Not on file  . Years of education: Not on file  . Highest education level: Not on file  Occupational History  . Occupation: Musician: [id number]    Comment: 3 days a week  Social Needs  . Financial resource strain: Not on file  . Food insecurity:    Worry: Not on file    Inability: Not on file  . Transportation needs:    Medical: Not on file    Non-medical: Not on file  Tobacco Use  . Smoking status: Former Games developer  . Smokeless tobacco: Never Used  Substance and Sexual Activity  . Alcohol use: No  . Drug use: No  . Sexual activity: Never  Lifestyle  . Physical activity:    Days per week: Not on file  Minutes per session: Not on file  . Stress: Not on file  Relationships  . Social connections:    Talks on phone: Not on file    Gets together: Not on file    Attends religious service: Not on file    Active member of club or organization: Not on file    Attends meetings of clubs or organizations: Not on file    Relationship status: Not on file  . Intimate partner violence:    Fear of current or ex partner: Not on file    Emotionally abused: Not on file    Physically abused: Not on file    Forced sexual activity: Not on file  Other Topics Concern  . Not on file  Social History Narrative   Widowed after 64 years   Working some at [employer] in [employer] with daughter     Family History  Problem Relation Age of Onset  . Diabetes Mother   . Cancer Father        jaw    Allergies  Allergen Reactions  . Latex Rash  . Tetanus Toxoid Anaphylaxis    REACTION: anaphylaxis  . Alendronate Sodium     REACTION: passed out    Medication list reviewed and updated in full in [facility].   GEN: No acute illnesses, no fevers, chills. GI: No n/v/d, eating normally Pulm: No SOB Interactive and getting along well at home.  Otherwise, ROS is as per the HPI.  Objective:   BP 138/84   Pulse 73   Temp 97.6 F (36.4 C) (Oral)   Ht 4\' 11"  (1.499 m)   Wt 129 lb (58.5 kg)   SpO2 97%   BMI 26.05 kg/m   GEN: WDWN, NAD, Non-toxic, A & O x 3 HEENT: Atraumatic, Normocephalic. Neck supple. No masses, No LAD. Ears and Nose: No external deformity. CV: RRR, No M/G/R. No JVD. No thrill. No extra heart sounds. PULM: CTA B, no wheezes, crackles, rhonchi. No retractions. No resp. distress. No accessory muscle use. EXTR: No c/c/e NEURO Normal gait.  PSYCH: Normally interactive. Conversant. Not depressed or anxious appearing.  Calm demeanor.   Laboratory and Imaging Data: Results for orders placed or performed in visit on 08/22/18  Basic metabolic panel  Result Value Ref Range   Sodium 139 135 - 145 mEq/L   Potassium 4.0 3.5 - 5.1 mEq/L   Chloride 103 96 - 112 mEq/L   CO2 29 19 - 32 mEq/L   Glucose, Bld 93 70 - 99 mg/dL   BUN 18 6 - 23 mg/dL   Creatinine, Ser 7.68 0.40 - 1.20 mg/dL   Calcium 9.5 8.4 - 11.5 mg/dL   GFR 72.62 >03.55 mL/min  CBC with Differential/Platelet  Result Value Ref Range   WBC 7.8 4.0 - 10.5 K/uL   RBC 5.29 (H) 3.87 - 5.11 Mil/uL   Hemoglobin 13.6 12.0 - 15.0 g/dL   HCT 97.4 16.3 - 84.5 %   MCV 79.1 78.0 - 100.0 fl   MCHC 32.5 30.0 - 36.0 g/dL   RDW 36.4 (H) 68.0 - 32.1 %   Platelets 207.0 150.0 - 400.0 K/uL   Neutrophils Relative % 54.8 43.0 - 77.0 %   Lymphocytes Relative 32.9 12.0 - 46.0 %   Monocytes Relative 11.0 3.0  - 12.0 %   Eosinophils Relative 1.1 0.0 - 5.0 %   Basophils Relative 0.2 0.0 - 3.0 %   Neutro Abs 4.3 1.4 -  7.7 K/uL   Lymphs Abs 2.6 0.7 - 4.0 K/uL   Monocytes Absolute 0.9 0.1 - 1.0 K/uL   Eosinophils Absolute 0.1 0.0 - 0.7 K/uL   Basophils Absolute 0.0 0.0 - 0.1 K/uL  Hepatic function panel  Result Value Ref Range   Total Bilirubin 0.4 0.2 - 1.2 mg/dL   Bilirubin, Direct 0.0 0.0 - 0.3 mg/dL   Alkaline Phosphatase 84 39 - 117 U/L   AST 18 0 - 37 U/L   ALT 9 0 - 35 U/L   Total Protein 7.0 6.0 - 8.3 g/dL   Albumin 4.1 3.5 - 5.2 g/dL     Assessment and Plan:   Essential hypertension  Viral bronchitis  Reassurance. I would decrease BP checks to no more than q every other week. Anxiety is playing a role, and I worry about hypotension in an almost 85 year old.  Lungs clear, abx to hold if sx > 10 days  Follow-up: No follow-ups on file.  Meds ordered this encounter  Medications  . azithromycin (ZITHROMAX) 250 MG tablet    Sig: 2 tabs po on day 1, then 1 tab po for 4 days    Dispense:  6 tablet    Refill:  0   Signed,  Karron Goens T. Jae Bruck, MD   Outpatient Encounter Medications as of 09/24/2018  Medication Sig  . docusate sodium (COLACE) 100 MG capsule Take 100 mg by mouth daily.   . fluticasone (FLONASE) 50 MCG/ACT nasal spray Place 2 sprays into both nostrils daily.  Marland Kitchen. lisinopril (PRINIVIL,ZESTRIL) 20 MG tablet Take 1 tablet (20 mg total) by mouth daily.  . metoprolol succinate (TOPROL-XL) 25 MG 24 hr tablet Take 1 tablet (25 mg total) by mouth daily.  . Multiple Vitamins-Minerals (ICAPS) CAPS Take 1 capsule by mouth 2 (two) times daily.   . NON FORMULARY Vitamin D 50 mcg, take one daily by mouth  . XARELTO 20 MG TABS tablet TAKE 1 TABLET BY MOUTH ONCE DAILY WITH SUPPER  . azithromycin (ZITHROMAX) 250 MG tablet 2 tabs po on day 1, then 1 tab po for 4 days   No facility-administered encounter medications on file as of 09/24/2018.

## 2018-09-24 ENCOUNTER — Ambulatory Visit (INDEPENDENT_AMBULATORY_CARE_PROVIDER_SITE_OTHER): Payer: PPO | Admitting: Family Medicine

## 2018-09-24 ENCOUNTER — Ambulatory Visit: Payer: PPO | Attending: Family Medicine

## 2018-09-24 ENCOUNTER — Ambulatory Visit: Payer: PPO | Admitting: Family Medicine

## 2018-09-24 ENCOUNTER — Encounter: Payer: Self-pay | Admitting: Family Medicine

## 2018-09-24 VITALS — BP 138/84 | HR 73 | Temp 97.6°F | Ht 59.0 in | Wt 129.0 lb

## 2018-09-24 DIAGNOSIS — J208 Acute bronchitis due to other specified organisms: Secondary | ICD-10-CM

## 2018-09-24 DIAGNOSIS — R262 Difficulty in walking, not elsewhere classified: Secondary | ICD-10-CM | POA: Diagnosis not present

## 2018-09-24 DIAGNOSIS — I10 Essential (primary) hypertension: Secondary | ICD-10-CM

## 2018-09-24 DIAGNOSIS — M6281 Muscle weakness (generalized): Secondary | ICD-10-CM | POA: Diagnosis not present

## 2018-09-24 MED ORDER — AZITHROMYCIN 250 MG PO TABS: ORAL_TABLET | ORAL | 0 refills | Status: DC

## 2018-09-24 NOTE — Therapy (Signed)
[facility] PHYSICAL AND SPORTS MEDICINE [address] [place], [place], [address] Phone: [phone]   Fax:  [phone]  Physical Therapy Treatment  Patient Details  Name: Paula CornsRoxanne D Silvernail MRN: [id number] Date of Birth: [birth year] Referring Provider (PT): spencer copland MD   Encounter Date: 09/24/2018  PT End of Session - 09/24/18 1838    Visit Number  44    Number of Visits  50    Date for PT Re-Evaluation  11/05/18    PT Start Time  1800    PT Stop Time  1845    PT Time Calculation (min)  45 min    Equipment Utilized During Treatment  Gait belt    Activity Tolerance  Patient tolerated treatment well    Behavior During Therapy  [facility] assessed/performed       Past Medical History:  Diagnosis Date  . Anemia   . Back pain   . Hypertension 12/25/2013  . Ischemic bowel disease (HCC)    a. 03/2017 abd pain-->internal herina and ischemic jejunum s/p ex lap and small bowel resection.  . Macular degeneration of left eye 01/06/2015  . Osteoporosis   . Persistent atrial fibrillation    a. First noted 03/2017;  b. 07/2017 Echo: EF 60-65%, no rwma, mild MR, mildly to mod dil LA; c. 07/2017 48hr Holter: persistent Afib, avg HR of 66 (range 39-140 bpm);  d. CHA2DS2VASc = 4-->Xarelto initiated 07/2017.    Past Surgical History:  Procedure Laterality Date  . CLOSED REDUCTION PROXIMAL HUMERUS FRACTURE Left 2017  . LAPAROSCOPIC SMALL BOWEL RESECTION    . LAPAROTOMY N/A 03/18/2017   Procedure: EXPLORATORY LAPAROTOMY, SMALL BOWEL RESECTION;  Surgeon: Leafy RoPabon, Diego F, MD;  Location: [facility] ORS;  Service: General;  Laterality: N/A;  . TOTAL ABDOMINAL HYSTERECTOMY      There were no vitals filed for this visit.  Subjective Assessment - 09/24/18 1832    Subjective  Patient reports she has been feeling sick since the previous Sunday. Patient states she had a high blood pressure reading over the weekend.     Pertinent History  Small bowel surgery      Limitations  Standing;Walking    Patient Stated Goals  To walk without feeling of falling     Currently in Pain?  No/denies       TREATMENT Vitals: 139/80; SpO2: 97%; HR: 69bpm  Therapeutic Exercise: Nustep in sitting -- level 7 seat level and resistance level 5- x9 min Sit to stands in the chair with therapist support - x 10  Step ups onto 6" step - x 15 Side stepping onto 6" step - x 15 Hip abduction in standing -  x 10 Hip extension in standing - x10 Ambulation with focus on improving ambulation - x 11650ft Heel raises in standing - x 15   Patient demonstrates increased fatigue at the end of the session.    PT Education - 09/24/18 1837    Education provided  Yes    Education Details  form/technique with exercise    Person(s) Educated  Patient    Methods  Explanation;Demonstration    Comprehension  Verbalized understanding;Returned demonstration          PT Long Term Goals - 09/24/18 1828      PT LONG TERM GOAL #1   Title  Patient will be independent with HEP focused on decreasing fall risk to continue benefits of therapy after discharge.     Baseline  Dependent with  exercise performance; 01/01/2018: moderate cueing for performance. 03/28/18 min-mod cueing required; 04/30/18: min cueing required; 09/24/2018: min cueing required    Time  6    Period  Weeks    Status  On-going      PT LONG TERM GOAL #2   Title  Patient will improve TUG to under 12 sec to indicate singificant decrease in fall risk.     Baseline  20sec; 01/01/2018: 14sec; 02/12/2018; 11.59 sec;    Time  8    Period  Weeks    Status  Achieved      PT LONG TERM GOAL #3   Title  Patient will improve her ability to perform to 37m/s to indicate increased safety with walking at community ambulation    Baseline  .65m/s; 01/01/2018: .21m/s; 02/12/2018: .44m/s; 03/28/18 .94 m/s; 8/19/9: testing of goals deferred to next session; 06/28/2018: .57m/s; 09/24/2018: deferred    Time  8    Period  Weeks    Status  On-going       PT LONG TERM GOAL #4   Title  Patient will improve 5XSTS to under 16 sec to indicate significant improvement LE functional strength and decrease fall risk.    Baseline  30sec; 01/01/2018: 12 sec    Time  8    Period  Weeks    Status  Achieved      PT LONG TERM GOAL #5   Title  Pt will ambulate 566ft in 3 minutes to demonstrate improved endurance with walking into department stores from the parking lot.     Baseline  03/28/18: 331ft; 04/30/18: testing of goals deferred to next session; 06/28/2018: 269ft; 09/24/2018: deferred    Time  6    Period  Weeks    Status  On-going            Plan - 09/24/18 1846    Clinical Impression Statement  Deferred remeasuring goals today as patient reports increased fatigue and states she is not completely over sickness. Patient demonstrates improvement with requiring less breaks during the session indicating functional carryover between sessions.      Rehab Potential  Fair    Clinical Impairments Affecting Rehab Potential  (+) family support, decreased coormorbities (-) age    PT Frequency  1x / week    PT Duration  6 weeks    PT Treatment/Interventions  Balance training;Neuromuscular re-education;Gait training;Patient/family education;Therapeutic exercise;Therapeutic activities;Functional mobility training;Stair training;Moist Heat;Aquatic Therapy;Electrical Stimulation;Cryotherapy;Manual techniques    PT Next Visit Plan  Attempt more dynamic balance tasks, ambulation over obstacles, ambulation with head turns, stepping response tasks    PT Home Exercise Plan  See education    Consulted and Agree with Plan of Care  Patient       Patient will benefit from skilled therapeutic intervention in order to improve the following deficits and impairments:  Abnormal gait, Decreased coordination, Decreased endurance, Decreased activity tolerance, Decreased strength, Decreased range of motion, Decreased balance, Difficulty walking  Visit  Diagnosis: Difficulty in walking, not elsewhere classified  Muscle weakness (generalized)     Problem List Patient Active Problem List   Diagnosis Date Noted  . Atrial fibrillation (HCC) 04/26/2017  . Anemia 04/26/2017  . 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 Macular degeneration of left eye 01/06/2015  . Essential hypertension 12/25/2013  . BACK PAIN 08/02/2010    Myrene Galas, PT DPT 09/24/2018, 6:58 PM  [facility] PHYSICAL AND  SPORTS MEDICINE [address], [place], [address] Phone: [phone]   Fax:  [phone]  Name: ELLENOR GOTTO MRN: [id number] Date of Birth: 22-Jun-1925

## 2018-09-24 NOTE — Patient Instructions (Signed)
Do not check your blood pressure more than once every 2 weeks.

## 2018-10-01 ENCOUNTER — Ambulatory Visit: Payer: PPO

## 2018-10-08 ENCOUNTER — Telehealth: Payer: Self-pay | Admitting: Family Medicine

## 2018-10-08 ENCOUNTER — Ambulatory Visit: Payer: PPO

## 2018-10-08 DIAGNOSIS — M6281 Muscle weakness (generalized): Secondary | ICD-10-CM

## 2018-10-08 DIAGNOSIS — R262 Difficulty in walking, not elsewhere classified: Secondary | ICD-10-CM | POA: Diagnosis not present

## 2018-10-08 NOTE — Therapy (Signed)
[place] [facility] PHYSICAL AND SPORTS MEDICINE [address] [place], [address] Phone: [phone]   Fax:  [phone]  Physical Therapy Treatment  Patient Details  Name: Paula Luna MRN: [id number] Date of Birth: 10-31-1924 Referring Provider (PT): spencer copland MD   Encounter Date: 10/08/2018  PT End of Session - 10/08/18 1025    Visit Number  45    Number of Visits  50    Date for PT Re-Evaluation  11/05/18    PT Start Time  0915    PT Stop Time  1000    PT Time Calculation (min)  45 min    Equipment Utilized During Treatment  Gait belt    Activity Tolerance  Patient tolerated treatment well    Behavior During Therapy  [facility] for tasks assessed/performed       Past Medical History:  Diagnosis Date  . Anemia   . Back pain   . Hypertension 12/25/2013  . Ischemic bowel disease (HCC)    a. 03/2017 abd pain-->internal herina and ischemic jejunum s/p ex lap and small bowel resection.  . Macular degeneration of left eye 01/06/2015  . Osteoporosis   . Persistent atrial fibrillation    a. First noted 03/2017;  b. 07/2017 Echo: EF 60-65%, no rwma, mild MR, mildly to mod dil LA; c. 07/2017 48hr Holter: persistent Afib, avg HR of 66 (range 39-140 bpm);  d. CHA2DS2VASc = 4-->Xarelto initiated 07/2017.    Past Surgical History:  Procedure Laterality Date  . CLOSED REDUCTION PROXIMAL HUMERUS FRACTURE Left 2017  . LAPAROSCOPIC SMALL BOWEL RESECTION    . LAPAROTOMY N/A 03/18/2017   Procedure: EXPLORATORY LAPAROTOMY, SMALL BOWEL RESECTION;  Surgeon: Leafy Ro, MD;  Location: [facility] ORS;  Service: General;  Laterality: N/A;  . TOTAL ABDOMINAL HYSTERECTOMY      There were no vitals filed for this visit.  Subjective Assessment - 10/08/18 1019    Subjective  Patient reports she has been feeling tired from feeling ill. Patient states no major changes othewise and reports she continues to walk throughout the day in her house.     Pertinent History   Small bowel surgery     Limitations  Standing;Walking    Patient Stated Goals  To walk without feeling of falling     Currently in Pain?  No/denies        TREATMENT Vitals: 150/54;   Therapeutic Exercise: Nustep in sitting -- level 7 seat level and resistance level 5- x10 min Sit to stands in the chair without therapist support - 2 x 10  Step ups onto 6" step - 2x 15 Side stepping onto 6" step - x 10 Hip extension in standing - 3 x10 with 2# Ambulation with focus on improving ambulation - x 179ft  Patient demonstrates increased fatigue at the end of the session.    PT Education - 10/08/18 1025    Education provided  Yes    Education Details  form/technique with exercise    Person(s) Educated  Patient    Methods  Explanation;Demonstration    Comprehension  Returned demonstration;Verbalized understanding          PT Long Term Goals - 10/08/18 1026      PT LONG TERM GOAL #1   Title  Patient will be independent with HEP focused on decreasing fall risk to continue benefits of therapy after discharge.     Baseline  Dependent with exercise performance; 01/01/2018: moderate cueing for performance. 03/28/18 min-mod  cueing required; 04/30/18: min cueing required; 09/24/2018: min cueing required    Time  6    Period  Weeks    Status  On-going      PT LONG TERM GOAL #2   Title  Patient will improve TUG to under 12 sec to indicate singificant decrease in fall risk.     Baseline  20sec; 01/01/2018: 14sec; 02/12/2018; 11.59 sec;    Time  8    Period  Weeks    Status  Achieved      PT LONG TERM GOAL #3   Title  Patient will improve her ability to perform 10mWT to 3627m/s to indicate increased safety with walking at community ambulation    Baseline  .64427m/s; 01/01/2018: .73272m/s; 02/12/2018: .60872m/s; 03/28/18 .94 m/s; 8/19/9: testing of goals deferred to next session; 06/28/2018: .7272m/s; 09/24/2018: deferred; 10/08/2018: .85 m/s    Time  8    Period  Weeks    Status  On-going      PT LONG  TERM GOAL #4   Title  Patient will improve 5XSTS to under 16 sec to indicate significant improvement LE functional strength and decrease fall risk.    Baseline  30sec; 01/01/2018: 12 sec    Time  8    Period  Weeks    Status  Achieved      PT LONG TERM GOAL #5   Title  Pt will ambulate 53500ft in 3 minutes to demonstrate improved endurance with walking into department stores from the parking lot.     Baseline  03/28/18: 36650ft; 04/30/18: testing of goals deferred to next session; 06/28/2018: 2950ft; 09/24/2018: deferred; 10/08/2018: 23600ft    Time  6    Period  Weeks    Status  On-going            Plan - 10/08/18 1027    Clinical Impression Statement  Patient is making progress towards long term goals with improved ability to perform 10mWT at a greater speed compared to previous sessions. Patient does demonstrate decreased 6minWT scores today most likely from a recent illness patient has had. Patient demonstrates poor endurance overall and slower gait speeds and continues to lead to increased fall risk with ambulation. Patient will benefit from further skilled therapy to return to prior level of function.      Rehab Potential  Fair    Clinical Impairments Affecting Rehab Potential  (+) family support, decreased coormorbities (-) age    PT Frequency  1x / week    PT Duration  6 weeks    PT Treatment/Interventions  Balance training;Neuromuscular re-education;Gait training;Patient/family education;Therapeutic exercise;Therapeutic activities;Functional mobility training;Stair training;Moist Heat;Aquatic Therapy;Electrical Stimulation;Cryotherapy;Manual techniques    PT Next Visit Plan  Attempt more dynamic balance tasks, ambulation over obstacles, ambulation with head turns, stepping response tasks    PT Home Exercise Plan  See education    Consulted and Agree with Plan of Care  Patient       Patient will benefit from skilled therapeutic intervention in order to improve the following deficits and  impairments:  Abnormal gait, Decreased coordination, Decreased endurance, Decreased activity tolerance, Decreased strength, Decreased range of motion, Decreased balance, Difficulty walking  Visit Diagnosis: Difficulty in walking, not elsewhere classified  Muscle weakness (generalized)     Problem List Patient Active Problem List   Diagnosis Date Noted  . Atrial fibrillation (HCC) 04/26/2017  . Anemia 04/26/2017  . 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  Macular degeneration of left eye 01/06/2015  . Essential hypertension 12/25/2013  . BACK PAIN 08/02/2010    Myrene GalasWesley Thara Searing, PT DPT 10/08/2018, 10:34 AM  [facility] [facility]ICINE [address] [place], [place], [address] Phone: [phone]   Fax:  [phone]  Name: Lattie CornsRoxanne D Ellis MRN: [id number] Date of Birth: [birth year]

## 2018-10-08 NOTE — Telephone Encounter (Signed)
Paula Luna,  Have you see this paperwork come through yet?

## 2018-10-08 NOTE — Telephone Encounter (Signed)
Pt's daughter [name] (on dpr) called office requesting a call from [name] to see if paperwork from the disability office was received. She was stating the paperwork needs to be sent back in by 2/10

## 2018-10-08 NOTE — Telephone Encounter (Signed)
Left message asking ann to call the office

## 2018-10-08 NOTE — Telephone Encounter (Signed)
I have not seen any paperwork in regards to this.

## 2018-10-08 NOTE — Telephone Encounter (Signed)
I havent see anything to be filled out.    On 09/27/2018 [employer] faxed  Request for medical records from 02/21/2018 to present. This was emailed to [employer] our [employer] on 09/27/2018.  It takes 14 to 17 business days for them to copy the records and mail.

## 2018-10-12 ENCOUNTER — Telehealth: Payer: Self-pay | Admitting: Internal Medicine

## 2018-10-12 NOTE — Telephone Encounter (Signed)
Received records request [employer], forwarded to [employer] for processing.  

## 2018-10-15 ENCOUNTER — Ambulatory Visit: Payer: PPO | Attending: Family Medicine

## 2018-10-15 DIAGNOSIS — R262 Difficulty in walking, not elsewhere classified: Secondary | ICD-10-CM | POA: Diagnosis not present

## 2018-10-15 DIAGNOSIS — M6281 Muscle weakness (generalized): Secondary | ICD-10-CM | POA: Insufficient documentation

## 2018-10-15 NOTE — Therapy (Signed)
[facility] [address] [place], [place], [address] Phone: [phone]   Fax:  [phone]  Physical Therapy Treatment  Patient Details  Name: Paula CornsRoxanne D Parks MRN: [id number] Date of Birth: 07/22/1925 Referring Provider (PT): spencer copland MD   Encounter Date: 10/15/2018  PT End of Session - 10/15/18 1624    Visit Number  46    Number of Visits  50    Date for PT Re-Evaluation  11/05/18    PT Start Time  1130    PT Stop Time  1200    PT Time Calculation (min)  30 min    Equipment Utilized During Treatment  Gait belt    Activity Tolerance  Patient tolerated treatment well    Behavior During Therapy  [facility] for tasks assessed/performed       Past Medical History:  Diagnosis Date  . Anemia   . Back pain   . Hypertension 12/25/2013  . Ischemic bowel disease (HCC)    a. 03/2017 abd pain-->internal herina and ischemic jejunum s/p ex lap and small bowel resection.  . Macular degeneration of left eye 01/06/2015  . Osteoporosis   . Persistent atrial fibrillation    a. First noted 03/2017;  b. 07/2017 Echo: EF 60-65%, no rwma, mild MR, mildly to mod dil LA; c. 07/2017 48hr Holter: persistent Afib, avg HR of 66 (range 39-140 bpm);  d. CHA2DS2VASc = 4-->Xarelto initiated 07/2017.    Past Surgical History:  Procedure Laterality Date  . CLOSED REDUCTION PROXIMAL HUMERUS FRACTURE Left 2017  . LAPAROSCOPIC SMALL BOWEL RESECTION    . LAPAROTOMY N/A 03/18/2017   Procedure: EXPLORATORY LAPAROTOMY, SMALL BOWEL RESECTION;  Surgeon: Leafy RoPabon, Diego F, MD;  Location: [facility] ORS;  Service: General;  Laterality: N/A;  . TOTAL ABDOMINAL HYSTERECTOMY      There were no vitals filed for this visit.  Subjective Assessment - 10/15/18 1143    Subjective  Patient reports she conitnues to feel tired but reports no other changes. Patient states she is having difficulty seeing today and states there are somedays that are better than others.    Pertinent History  Small bowel surgery     Limitations  Standing;Walking    Patient Stated Goals  To walk without feeling of falling           TREATMENT Therapeutic Exercise: Nustep in sitting -- level 7 seat level and resistance level 5- x7 min Ambulation with focus on improving ambulation - x 16650ft Side stepping with RTB around ankle - x4710ft Sit to stands in the chair without therapist support - x 10  Side stepping onto 6" step - x 10  Patient demonstrates increased fatigue at the end of the session.    PT Education - 10/15/18 1622    Education provided  Yes    Education Details  form/technique with exercise    Person(s) Educated  Patient    Methods  Explanation;Demonstration    Comprehension  Verbalized understanding;Returned demonstration          PT Long Term Goals - 10/08/18 1026      PT LONG TERM GOAL #1   Title  Patient will be independent with HEP focused on decreasing fall risk to continue benefits of therapy after discharge.     Baseline  Dependent with exercise performance; 01/01/2018: moderate cueing for performance. 03/28/18 min-mod cueing required; 04/30/18: min cueing required; 09/24/2018: min cueing required    Time  6    Period  Weeks    Status  On-going      PT LONG TERM GOAL #2   Title  Patient will improve TUG to under 12 sec to indicate singificant decrease in fall risk.     Baseline  20sec; 01/01/2018: 14sec; 02/12/2018; 11.59 sec;    Time  8    Period  Weeks    Status  Achieved      PT LONG TERM GOAL #3   Title  Patient will improve her ability to perform to 30m/s to indicate increased safety with walking at community ambulation    Baseline  .69m/s; 01/01/2018: .108m/s; 02/12/2018: .82m/s; 03/28/18 .94 m/s; 8/19/9: testing of goals deferred to next session; 06/28/2018: .57m/s; 09/24/2018: deferred; 10/08/2018: .85 m/s    Time  8    Period  Weeks    Status  On-going      PT LONG TERM GOAL #4   Title  Patient will improve 5XSTS to under 16 sec to  indicate significant improvement LE functional strength and decrease fall risk.    Baseline  30sec; 01/01/2018: 12 sec    Time  8    Period  Weeks    Status  Achieved      PT LONG TERM GOAL #5   Title  Pt will ambulate 532ft in 3 minutes to demonstrate improved endurance with walking into department stores from the parking lot.     Baseline  03/28/18: 337ft; 04/30/18: testing of goals deferred to next session; 06/28/2018: 218ft; 09/24/2018: deferred; 10/08/2018: 277ft    Time  6    Period  Weeks    Status  On-going            Plan - 10/15/18 1624    Clinical Impression Statement  Patient arrives to clinic late secondary to difficulty finding a ride to therapy. Patient demonstrates increased faitgue with exercise requiring several sitting breaks thoruhgout the sessions. Patient c/o stomach pain throuhgout the session limiting exercises to be performed. Although patient is improving, she continues to demonstrate poor strength and gait speed. Patient will benefit from further skilled to return to prior level of function.     Clinical Presentation  Unstable    Rehab Potential  Fair    Clinical Impairments Affecting Rehab Potential  (+) family support, decreased coormorbities (-) age    PT Frequency  1x / week    PT Duration  6 weeks    PT Treatment/Interventions  Balance training;Neuromuscular re-education;Gait training;Patient/family education;Therapeutic exercise;Therapeutic activities;Functional mobility training;Stair training;Moist Heat;Aquatic Therapy;Electrical Stimulation;Cryotherapy;Manual techniques    PT Next Visit Plan  Attempt more dynamic balance tasks, ambulation over obstacles, ambulation with head turns, stepping response tasks    PT Home Exercise Plan  See education    Consulted and Agree with Plan of Care  Patient       Patient will benefit from skilled therapeutic intervention in order to improve the following deficits and impairments:  Abnormal gait, Decreased  coordination, Decreased endurance, Decreased activity tolerance, Decreased strength, Decreased range of motion, Decreased balance, Difficulty walking  Visit Diagnosis: Difficulty in walking, not elsewhere classified  Muscle weakness (generalized)     Problem List Patient Active Problem List   Diagnosis Date Noted  . Atrial fibrillation (HCC) 04/26/2017  . Anemia 04/26/2017  . 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 Macular degeneration of left eye 01/06/2015  . Essential hypertension 12/25/2013  . BACK PAIN 08/02/2010    Myrene Galas,  PT DPT 10/15/2018, 4:42 PM  [facility]INE [address], [place], [address] Phone: [phone]   Fax:  [phone]  Name: REIDA HEM MRN: [id number] Date of Birth: 02/21/1925

## 2018-10-22 ENCOUNTER — Other Ambulatory Visit: Payer: Self-pay | Admitting: Internal Medicine

## 2018-10-22 DIAGNOSIS — H0102B Squamous blepharitis left eye, upper and lower eyelids: Secondary | ICD-10-CM | POA: Diagnosis not present

## 2018-10-22 DIAGNOSIS — H11823 Conjunctivochalasis, bilateral: Secondary | ICD-10-CM | POA: Diagnosis not present

## 2018-10-22 DIAGNOSIS — H0102A Squamous blepharitis right eye, upper and lower eyelids: Secondary | ICD-10-CM | POA: Diagnosis not present

## 2018-10-22 DIAGNOSIS — H353112 Nonexudative age-related macular degeneration, right eye, intermediate dry stage: Secondary | ICD-10-CM | POA: Diagnosis not present

## 2018-10-22 DIAGNOSIS — H04123 Dry eye syndrome of bilateral lacrimal glands: Secondary | ICD-10-CM | POA: Diagnosis not present

## 2018-10-22 DIAGNOSIS — H02826 Cysts of left eye, unspecified eyelid: Secondary | ICD-10-CM | POA: Diagnosis not present

## 2018-10-22 DIAGNOSIS — H353124 Nonexudative age-related macular degeneration, left eye, advanced atrophic with subfoveal involvement: Secondary | ICD-10-CM | POA: Diagnosis not present

## 2018-10-22 NOTE — Telephone Encounter (Signed)
Please review for refill. Thanks!  

## 2018-10-24 ENCOUNTER — Ambulatory Visit: Payer: PPO

## 2018-10-24 DIAGNOSIS — R262 Difficulty in walking, not elsewhere classified: Secondary | ICD-10-CM

## 2018-10-24 DIAGNOSIS — M6281 Muscle weakness (generalized): Secondary | ICD-10-CM

## 2018-10-24 NOTE — Telephone Encounter (Signed)
Spoke with Bonita Quin @ [facility] @ [phone]  She stated they did received records release from [employer] they use a 3rd party release point.  Bonita Quin refused to send records the release had [facility] not [facility] she faxed this information to release point.  She also received a medical records release on 2/7 again she refused the date on medical records release was an old date.  She again let them why she refused  She stated to put todays date on medical records release  Bonita Quin stated to have them faxed medical records release to her directly  @ [phone].  [name] is aware and will contact [employer].

## 2018-10-24 NOTE — Telephone Encounter (Signed)
[name] called and wants to know if could ck on status with [facility] because the [employer] has new date needing info of 11/06/18. Transferred call to Robin.

## 2018-10-24 NOTE — Therapy (Signed)
[facility] [address], [place], [address] Phone: [phone]   Fax:  [phone]  Physical Therapy Treatment  Patient Details  Name: MUSETTA MATEL MRN: [id number] Date of Birth: 11-29-1924 Referring Provider (PT): spencer copland MD   Encounter Date: 10/24/2018  PT End of Session - 10/24/18 1606    Visit Number  47    Number of Visits  50    Date for PT Re-Evaluation  11/05/18    PT Start Time  1345    PT Stop Time  1430    PT Time Calculation (min)  45 min    Equipment Utilized During Treatment  Gait belt    Activity Tolerance  Patient tolerated treatment well    Behavior During Therapy  [facility] for tasks assessed/performed       Past Medical History:  Diagnosis Date  . Anemia   . Back pain   . Hypertension 12/25/2013  . Ischemic bowel disease (HCC)    a. 03/2017 abd pain-->internal herina and ischemic jejunum s/p ex lap and small bowel resection.  . Macular degeneration of left eye 01/06/2015  . Osteoporosis   . Persistent atrial fibrillation    a. First noted 03/2017;  b. 07/2017 Echo: EF 60-65%, no rwma, mild MR, mildly to mod dil LA; c. 07/2017 48hr Holter: persistent Afib, avg HR of 66 (range 39-140 bpm);  d. CHA2DS2VASc = 4-->Xarelto initiated 07/2017.    Past Surgical History:  Procedure Laterality Date  . CLOSED REDUCTION PROXIMAL HUMERUS FRACTURE Left 2017  . LAPAROSCOPIC SMALL BOWEL RESECTION    . LAPAROTOMY N/A 03/18/2017   Procedure: EXPLORATORY LAPAROTOMY, SMALL BOWEL RESECTION;  Surgeon: Leafy Ro, MD;  Location: [facility] ORS;  Service: General;  Laterality: N/A;  . TOTAL ABDOMINAL HYSTERECTOMY      There were no vitals filed for this visit.  Subjective Assessment - 10/24/18 1439    Subjective  Patient states she has had diarrhea today. Patient reports no major changes otherwise and reports she is feeling tired today.     Pertinent History  Small bowel surgery     Limitations   Standing;Walking    Patient Stated Goals  To walk without feeling of falling     Currently in Pain?  No/denies         TREATMENT Therapeutic Exercise: Nustep in sitting -- level 7 seat level and resistance level 7- x2 min; resistance level 5 x Ambulation with focus on improving ambulation - x 139ft Hip abduction in standing with RTB - x 20 Hip extension in standing with RTB - 2 x 10 Side stepping up and over airex pad - x20 Sit to stands in the chair without therapist support - x 10  Forward stepping and backyard stepping onto airex pad with UE support - x 10    Patient demonstrates increased fatigue at the end of the session. Hip extension performed to address strength limitations mostly with prolonged walking and ambulation. Performed sit to stands to address strength limitations.    PT Education - 10/24/18 1550    Education provided  Yes    Education Details  form/technique with exercise    Person(s) Educated  Patient    Methods  Explanation;Demonstration    Comprehension  Returned demonstration;Verbalized understanding          PT Long Term Goals - 10/08/18 1026      PT LONG TERM GOAL #1   Title  Patient will  be independent with HEP focused on decreasing fall risk to continue benefits of therapy after discharge.     Baseline  Dependent with exercise performance; 01/01/2018: moderate cueing for performance. 03/28/18 min-mod cueing required; 04/30/18: min cueing required; 09/24/2018: min cueing required    Time  6    Period  Weeks    Status  On-going      PT LONG TERM GOAL #2   Title  Patient will improve TUG to under 12 sec to indicate singificant decrease in fall risk.     Baseline  20sec; 01/01/2018: 14sec; 02/12/2018; 11.59 sec;    Time  8    Period  Weeks    Status  Achieved      PT LONG TERM GOAL #3   Title  Patient will improve her ability to perform 10mWT to 5863m/s to indicate increased safety with walking at community ambulation    Baseline  .70463m/s; 01/01/2018:  .6995m/s; 02/12/2018: .5437m/s; 03/28/18 .94 m/s; 8/19/9: testing of goals deferred to next session; 06/28/2018: .5286m/s; 09/24/2018: deferred; 10/08/2018: .85 m/s    Time  8    Period  Weeks    Status  On-going      PT LONG TERM GOAL #4   Title  Patient will improve 5XSTS to under 16 sec to indicate significant improvement LE functional strength and decrease fall risk.    Baseline  30sec; 01/01/2018: 12 sec    Time  8    Period  Weeks    Status  Achieved      PT LONG TERM GOAL #5   Title  Pt will ambulate 56100ft in 3 minutes to demonstrate improved endurance with walking into department stores from the parking lot.     Baseline  03/28/18: 36450ft; 04/30/18: testing of goals deferred to next session; 06/28/2018: 26750ft; 09/24/2018: deferred; 10/08/2018: 2900ft    Time  6    Period  Weeks    Status  On-going            Plan - 10/24/18 1608    Clinical Impression Statement  Patient demonstrates increased fatigue with exercise. Patient demonstrates ability to perform greater amount of hip exercises compared to the previous session but is self limiting secondary to diarhhea symptoms. Patient continues to require UE support to perform exercises and will benefit from further skilled therapy to return to prior level of funciton.      Rehab Potential  Fair    Clinical Impairments Affecting Rehab Potential  (+) family support, decreased coormorbities (-) age    PT Frequency  1x / week    PT Duration  6 weeks    PT Treatment/Interventions  Balance training;Neuromuscular re-education;Gait training;Patient/family education;Therapeutic exercise;Therapeutic activities;Functional mobility training;Stair training;Moist Heat;Aquatic Therapy;Electrical Stimulation;Cryotherapy;Manual techniques    PT Next Visit Plan  Attempt more dynamic balance tasks, ambulation over obstacles, ambulation with head turns, stepping response tasks    PT Home Exercise Plan  See education    Consulted and Agree with Plan of Care  Patient        Patient will benefit from skilled therapeutic intervention in order to improve the following deficits and impairments:  Abnormal gait, Decreased coordination, Decreased endurance, Decreased activity tolerance, Decreased strength, Decreased range of motion, Decreased balance, Difficulty walking  Visit Diagnosis: Muscle weakness (generalized)  Difficulty in walking, not elsewhere classified     Problem List Patient Active Problem List   Diagnosis Date Noted  . Atrial fibrillation (HCC) 04/26/2017  . Anemia 04/26/2017  . Ischemic bowel  disease (HCC) 03/18/2017  . 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 Macular degeneration of left eye 01/06/2015  . Essential hypertension 12/25/2013  . BACK PAIN 08/02/2010    Myrene GalasWesley Anitha Kreiser, PT DPT 10/24/2018, 4:31 PM  [facility] [address] [place], [place], [address] Phone: [phone]   Fax:  [phone]  Name: Lattie CornsRoxanne D Pandit MRN: [id number] Date of Birth: Dec 03, 1924

## 2018-10-26 ENCOUNTER — Telehealth: Payer: Self-pay | Admitting: Family Medicine

## 2018-10-26 NOTE — Telephone Encounter (Signed)
best number [phone]  Spoke with ann (daughter) She stated yesterday @ PT patients BP was 126/85 she stated it has been running about this.   She also wanted to let you know pt had bloody nose last week 4-10 drops. 2 droplets were nickel size on shirt and the rest were dime size. She wanted to know what she needs to do if this happens again.  She stated pt has not had nose bleed since and is doing well.

## 2018-10-26 NOTE — Telephone Encounter (Signed)
Pt's daughter called office in regards to the disability ppw from [place]. She is requesting a call back from [place]

## 2018-10-26 NOTE — Telephone Encounter (Signed)
Spoke to ann.  She stated [employer] has received all the paperwork they needed

## 2018-10-28 NOTE — Telephone Encounter (Signed)
Please call  Great, this is normal blood pressure.  Can you go over with her how to use nasal compression to stop a bloody nose on both sides? I can show if a question.

## 2018-10-29 ENCOUNTER — Ambulatory Visit: Payer: PPO

## 2018-10-29 DIAGNOSIS — R262 Difficulty in walking, not elsewhere classified: Secondary | ICD-10-CM

## 2018-10-29 DIAGNOSIS — M6281 Muscle weakness (generalized): Secondary | ICD-10-CM

## 2018-10-29 NOTE — Telephone Encounter (Signed)
[name] (daughter) notified as instructed by telephone.  [name] states her mom has been fine since she called [place] on Friday.  No more nose bleeds.

## 2018-10-29 NOTE — Therapy (Signed)
[facility] [address] [place], [place], [address] Phone: [phone]   Fax:  [phone]  Physical Therapy Treatment  Patient Details  Name: Paula CornsRoxanne D Luna MRN: [id number] Date of Birth: 1924-12-29 Referring Provider (PT): spencer copland MD   Encounter Date: 10/29/2018  PT End of Session - 10/29/18 1652    Visit Number  48    Number of Visits  50    Date for PT Re-Evaluation  11/05/18    PT Start Time  1430    PT Stop Time  1515    PT Time Calculation (min)  45 min    Equipment Utilized During Treatment  Gait belt    Activity Tolerance  Patient tolerated treatment well    Behavior During Therapy  [facility] for tasks assessed/performed       Past Medical History:  Diagnosis Date  . Anemia   . Back pain   . Hypertension 12/25/2013  . Ischemic bowel disease (HCC)    a. 03/2017 abd pain-->internal herina and ischemic jejunum s/p ex lap and small bowel resection.  . Macular degeneration of left eye 01/06/2015  . Osteoporosis   . Persistent atrial fibrillation    a. First noted 03/2017;  b. 07/2017 Echo: EF 60-65%, no rwma, mild MR, mildly to mod dil LA; c. 07/2017 48hr Holter: persistent Afib, avg HR of 66 (range 39-140 bpm);  d. CHA2DS2VASc = 4-->Xarelto initiated 07/2017.    Past Surgical History:  Procedure Laterality Date  . CLOSED REDUCTION PROXIMAL HUMERUS FRACTURE Left 2017  . LAPAROSCOPIC SMALL BOWEL RESECTION    . LAPAROTOMY N/A 03/18/2017   Procedure: EXPLORATORY LAPAROTOMY, SMALL BOWEL RESECTION;  Surgeon: Leafy RoPabon, Diego F, MD;  Location: [facility] ORS;  Service: General;  Laterality: N/A;  . TOTAL ABDOMINAL HYSTERECTOMY      There were no vitals filed for this visit.  Subjective Assessment - 10/29/18 1651    Subjective  Patient reports she walked a lot today and reports she feels tired. Her daughter states [name]'s BP has been improving overall according to her PCP.     Pertinent History  Small bowel  surgery     Limitations  Standing;Walking    Patient Stated Goals  To walk without feeling of falling     Currently in Pain?  No/denies        TREATMENT Therapeutic Exercise Nustep in sitting at level 3 with focus on maintain speed around 50-60 SPM to lightly address cardiovascular endurance -- 5 min Sit to stands -- x 10 with UE support Hip flexion/extension (SLR) in long sitting with YTB around ankles -- x 25 Hip abduction with YTB around ankles -- x 25 Ambulation with cueing to improve speed - x 1600ft   Patient performed overall less exercises compared to previous sessions secondary to elevated BP. Patient required tactile and verbal cueing to perform exercises in sitting.    PT Education - 10/29/18 1652    Education provided  Yes    Education Details  form/technique with exercise    Person(s) Educated  Patient    Methods  Explanation;Demonstration    Comprehension  Verbalized understanding;Returned demonstration          PT Long Term Goals - 10/08/18 1026      PT LONG TERM GOAL #1   Title  Patient will be independent with HEP focused on decreasing fall risk to continue benefits of therapy after discharge.     Baseline  Dependent with exercise  performance; 01/01/2018: moderate cueing for performance. 03/28/18 min-mod cueing required; 04/30/18: min cueing required; 09/24/2018: min cueing required    Time  6    Period  Weeks    Status  On-going      PT LONG TERM GOAL #2   Title  Patient will improve TUG to under 12 sec to indicate singificant decrease in fall risk.     Baseline  20sec; 01/01/2018: 14sec; 02/12/2018; 11.59 sec;    Time  8    Period  Weeks    Status  Achieved      PT LONG TERM GOAL #3   Title  Patient will improve her ability to perform to 35m/s to indicate increased safety with walking at community ambulation    Baseline  .37m/s; 01/01/2018: .69m/s; 02/12/2018: .4m/s; 03/28/18 .94 m/s; 8/19/9: testing of goals deferred to next session; 06/28/2018: .27m/s;  09/24/2018: deferred; 10/08/2018: .85 m/s    Time  8    Period  Weeks    Status  On-going      PT LONG TERM GOAL #4   Title  Patient will improve 5XSTS to under 16 sec to indicate significant improvement LE functional strength and decrease fall risk.    Baseline  30sec; 01/01/2018: 12 sec    Time  8    Period  Weeks    Status  Achieved      PT LONG TERM GOAL #5   Title  Pt will ambulate 517ft in 3 minutes to demonstrate improved endurance with walking into department stores from the parking lot.     Baseline  03/28/18: 33ft; 04/30/18: testing of goals deferred to next session; 06/28/2018: 257ft; 09/24/2018: deferred; 10/08/2018: 231ft    Time  6    Period  Weeks    Status  On-going            Plan - 10/29/18 1653    Clinical Impression Statement  Patient demonstrates an intial increase in BP with a reading of 185/95; patient then sat quietly and was given water to drink. BO further dropped to 165/92 and we started the session. Due to increase in blood pressure, performed light exercises and not a large volume in standing. Patient demonstrates no negative signs or symptoms throughout the session. Patient will benefit from further skilled therapy to return to prior level of function.     Rehab Potential  Fair    Clinical Impairments Affecting Rehab Potential  (+) family support, decreased coormorbities (-) age    PT Frequency  1x / week    PT Duration  6 weeks    PT Treatment/Interventions  Balance training;Neuromuscular re-education;Gait training;Patient/family education;Therapeutic exercise;Therapeutic activities;Functional mobility training;Stair training;Moist Heat;Aquatic Therapy;Electrical Stimulation;Cryotherapy;Manual techniques    PT Next Visit Plan  Attempt more dynamic balance tasks, ambulation over obstacles, ambulation with head turns, stepping response tasks    PT Home Exercise Plan  See education    Consulted and Agree with Plan of Care  Patient       Patient will  benefit from skilled therapeutic intervention in order to improve the following deficits and impairments:  Abnormal gait, Decreased coordination, Decreased endurance, Decreased activity tolerance, Decreased strength, Decreased range of motion, Decreased balance, Difficulty walking  Visit Diagnosis: Difficulty in walking, not elsewhere classified  Muscle weakness (generalized)     Problem List Patient Active Problem List   Diagnosis Date Noted  . Atrial fibrillation (HCC) 04/26/2017  . Anemia 04/26/2017  . 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 Ischemic  necrosis of small bowel (HCC)   . Macular degeneration of left eye 01/06/2015  . Essential hypertension 12/25/2013  . BACK PAIN 08/02/2010    Myrene Galas, PT DPT 10/29/2018, 4:59 PM  [facility] AND SPORTS MEDICINE [address], [place], [address] Phone: [phone]   Fax:  [phone]  Name: SHAUNTEE BEZANSON MRN: [id number] Date of Birth: [birth year]

## 2018-10-31 ENCOUNTER — Ambulatory Visit: Payer: PPO

## 2018-11-09 NOTE — Telephone Encounter (Signed)
daughter [name] called stating [employer] called her to say that received a letter stating pt has not been seen by dr copland from 02/2018 to present.   I explained to ann we do not do medical records here and transfer her to [facility] @ [facility]

## 2018-11-12 ENCOUNTER — Ambulatory Visit: Payer: PPO | Attending: Family Medicine

## 2018-11-12 DIAGNOSIS — M6281 Muscle weakness (generalized): Secondary | ICD-10-CM | POA: Insufficient documentation

## 2018-11-12 NOTE — Therapy (Signed)
[facility] PHYSICAL AND SPORTS MEDICINE [address] [place], [address] Phone: [phone]   Fax:  [phone]  Physical Therapy Treatment  Patient Details  Name: Paula Luna MRN: [id number] Date of Birth: [birth year] Referring Provider (PT): spencer copland MD   Encounter Date: 11/12/2018  PT End of Session - 11/12/18 1044    Visit Number  49    Number of Visits  50    Date for PT Re-Evaluation  12/10/18    PT Start Time  1030    PT Stop Time  1115    PT Time Calculation (min)  45 min    Equipment Utilized During Treatment  Gait belt    Activity Tolerance  Patient tolerated treatment well    Behavior During Therapy  [employer] [employer] for tasks assessed/performed       Past Medical History:  Diagnosis Date  . Anemia   . Back pain   . Hypertension 12/25/2013  . Ischemic bowel disease (HCC)    a. 03/2017 abd pain-->internal herina and ischemic jejunum s/p ex lap and small bowel resection.  . Macular degeneration of left eye 01/06/2015  . Osteoporosis   . Persistent atrial fibrillation    a. First noted 03/2017;  b. 07/2017 Echo: EF 60-65%, no rwma, mild MR, mildly to mod dil LA; c. 07/2017 48hr Holter: persistent Afib, avg HR of 66 (range 39-140 bpm);  d. CHA2DS2VASc = 4-->Xarelto initiated 07/2017.    Past Surgical History:  Procedure Laterality Date  . CLOSED REDUCTION PROXIMAL HUMERUS FRACTURE Left 2017  . LAPAROSCOPIC SMALL BOWEL RESECTION    . LAPAROTOMY N/A 03/18/2017   Procedure: EXPLORATORY LAPAROTOMY, SMALL BOWEL RESECTION;  Surgeon: Leafy Ro, MD;  Location: [facility] ORS;  Service: General;  Laterality: N/A;  . TOTAL ABDOMINAL HYSTERECTOMY      There were no vitals filed for this visit.  Subjective Assessment - 11/12/18 1036    Subjective  Patient states she has been  walking a lot around the house and out in the community. Patient states no major changes  otherwise.     Pertinent History  Small bowel surgery     Limitations   Standing;Walking    Patient Stated Goals  To walk without feeling of falling     Currently in Pain?  No/denies       TREATMENT Therapeutic Exercise: Walking with rollator -- 784ft with focus on improving arm swings and heel strike Stepping over hurdles -- x10 (3 hurdles) Walking without UE support -- 3 x 15 ft Nustep in sitting -- level 3 for 5 min  Sit to stands -- x 5  Performed exercises in standing to address difficulties with walkiing. Patient demosntrates decreased clearance with steps.      PT Education - 11/12/18 1038    Education provided  Yes    Education Details  form/technique with exercise    Person(s) Educated  Patient    Methods  Explanation;Demonstration    Comprehension  Verbalized understanding;Returned demonstration          PT Long Term Goals - 11/12/18 1210      PT LONG TERM GOAL #1   Title  Patient will be independent with HEP focused on decreasing fall risk to continue benefits of therapy after discharge.     Baseline  Dependent with exercise performance; 01/01/2018: moderate cueing for performance. 03/28/18 min-mod cueing required; 04/30/18: min cueing required; 09/24/2018: min cueing required; 11/12/2018: Patient performs exercise with little to no  cueing.     Time  6    Period  Weeks    Status  Achieved      PT LONG TERM GOAL #2   Title  Patient will improve TUG to under 12 sec to indicate singificant decrease in fall risk.     Baseline  20sec; 01/01/2018: 14sec; 02/12/2018; 11.59 sec;    Time  8    Period  Weeks    Status  Achieved      PT LONG TERM GOAL #3   Title  Patient will improve her ability to perform to 31m/s to indicate increased safety with walking at community ambulation    Baseline  .2m/s; 01/01/2018: .35m/s; 02/12/2018: .66m/s; 03/28/18 .94 m/s; 8/19/9: testing of goals deferred to next session; 06/28/2018: .63m/s; 09/24/2018: deferred; 10/08/2018: .85 m/s; 11/12/2018:  .89 m/s    Time  8    Period  Weeks    Status  On-going      PT  LONG TERM GOAL #4   Title  Patient will improve 5XSTS to under 16 sec to indicate significant improvement LE functional strength and decrease fall risk.    Baseline  30sec; 01/01/2018: 12 sec    Time  8    Period  Weeks    Status  Achieved      PT LONG TERM GOAL #5   Title  Pt will ambulate 522ft in 3 minutes to demonstrate improved endurance with walking into department stores from the parking lot.     Baseline  03/28/18: 328ft; 04/30/18: testing of goals deferred to next session; 06/28/2018: 230ft; 09/24/2018: deferred; 10/08/2018: 231ft; 11/12/2018: 562ft with rollator    Time  6    Period  Weeks    Status  On-going            Plan - 11/12/18 1212    Clinical Impression Statement  Patient demonstrates improvement with ability to walk for long periods time compared to previous sessions however requires the use of a rollator walker. Patient demonstrates improvement with ability to perform a fast compard to previous sessions as well. However, all measurements were performed with rollator walker. Patient is limited secondary to fear of falling and decreased walking skills such as efficient arm swings, heel strike, and reciprocal gait pattern. Patient will benefit from further skilled therapy to return to prior level of function.     Rehab Potential  Fair    Clinical Impairments Affecting Rehab Potential  (+) family support, decreased coormorbities (-) age    PT Frequency  1x / week    PT Duration  6 weeks    PT Treatment/Interventions  Balance training;Neuromuscular re-education;Gait training;Patient/family education;Therapeutic exercise;Therapeutic activities;Functional mobility training;Stair training;Moist Heat;Aquatic Therapy;Electrical Stimulation;Cryotherapy;Manual techniques    PT Next Visit Plan  Attempt more dynamic balance tasks, ambulation over obstacles, ambulation with head turns, stepping response tasks    PT Home Exercise Plan  See education    Consulted and Agree with Plan  of Care  Patient       Patient will benefit from skilled therapeutic intervention in order to improve the following deficits and impairments:  Abnormal gait, Decreased coordination, Decreased endurance, Decreased activity tolerance, Decreased strength, Decreased range of motion, Decreased balance, Difficulty walking  Visit Diagnosis: Muscle weakness (generalized)     Problem List Patient Active Problem List   Diagnosis Date Noted  . Atrial fibrillation (HCC) 04/26/2017  . Anemia 04/26/2017  . 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 Ischemic necrosis of small  bowel (HCC)   . Macular degeneration of left eye 01/06/2015  . Essential hypertension 12/25/2013  . BACK PAIN 08/02/2010    Myrene Galas, PT DPT 11/12/2018, 2:56 PM   [facility] PHYSICAL AND SPORTS MEDICINE [address], [place], [address] Phone: [phone]   Fax:  [phone]  Name: Paula Luna MRN: [id number] Date of Birth: 09/05/1925

## 2018-11-17 ENCOUNTER — Other Ambulatory Visit: Payer: Self-pay | Admitting: Family Medicine

## 2018-11-17 ENCOUNTER — Other Ambulatory Visit: Payer: Self-pay | Admitting: Internal Medicine

## 2018-11-19 ENCOUNTER — Other Ambulatory Visit: Payer: Self-pay

## 2018-11-19 MED ORDER — RIVAROXABAN 20 MG PO TABS: 20.0000 mg | ORAL_TABLET | Freq: Every day | ORAL | 6 refills | Status: DC

## 2018-11-19 NOTE — Telephone Encounter (Signed)
Refill Request.  

## 2018-11-19 NOTE — Telephone Encounter (Signed)
Please review for refill.  

## 2018-11-26 ENCOUNTER — Ambulatory Visit: Payer: PPO

## 2018-12-04 ENCOUNTER — Telehealth: Payer: Self-pay | Admitting: *Deleted

## 2018-12-04 NOTE — Telephone Encounter (Signed)
   Cardiac Questionnaire:    Since your last visit or hospitalization:  ** Spoke with patient's daughter about patient, ok per DPR.     1. Have you been having new or worsening chest pain? NO   2. Have you been having new or worsening shortness of breath? NO 3. Have you been having new or worsening leg swelling, wt gain, or increase in abdominal girth (pants fitting more tightly)? NO   4. Have you had any passing out spells? NO    *A YES to any of these questions would result in the appointment being kept. *If all the answers to these questions are NO, we should indicate that given the current situation regarding the worldwide coronarvirus pandemic, at the recommendation of the CDC, we are looking to limit gatherings in our waiting area, and thus will reschedule their appointment beyond four weeks from today.   _____________      Primary Cardiologist:  Yvonne Kendall, MD   Patient contacted.  History reviewed.  No symptoms to suggest any unstable cardiac conditions.  Based on discussion, with current pandemic situation, we will be postponing this appointment for Paula Luna with a plan for f/u in 3 months or sooner if feasible/necessary.  If symptoms change, she has been instructed to contact our office.   Routing to C19 CANCEL pool for tracking (P CV DIV CV19 CANCEL - reason for visit "other.") and assigning priority (1 = 4-6 wks, 2 = 6-12 wks, 3 = >12 wks).   Catalina Gravel, RN  12/04/2018 11:16 AM         .

## 2018-12-07 ENCOUNTER — Ambulatory Visit: Payer: PPO | Admitting: Internal Medicine

## 2018-12-10 ENCOUNTER — Telehealth: Payer: Self-pay | Admitting: Internal Medicine

## 2018-12-10 ENCOUNTER — Ambulatory Visit: Payer: PPO

## 2018-12-10 NOTE — Telephone Encounter (Signed)
Patient scheduled evisit with Dr End on 4/2 at 3:00p

## 2018-12-10 NOTE — Telephone Encounter (Signed)
Virtual Visit Pre-Appointment Phone Call  Steps For Call:  1. Confirm consent - "In the setting of the current Covid19 crisis, you are scheduled for a (phone or video) visit with your provider on (date) at (time).  Just as we do with many in-office visits, in order for you to participate in this visit, we must obtain consent.  If you'd like, I can send this to your mychart (if signed up) or email for you to review.  Otherwise, I can obtain your verbal consent now.  All virtual visits are billed to your insurance company just like a normal visit would be.  By agreeing to a virtual visit, we'd like you to understand that the technology does not allow for your provider to perform an examination, and thus may limit your provider's ability to fully assess your condition.  Finally, though the technology is pretty good, we cannot assure that it will always work on either your or our end, and in the setting of a video visit, we may have to convert it to a phone-only visit.  In either situation, we cannot ensure that we have a secure connection.  Are you willing to proceed?"  2. Give patient instructions for WebEx download to smartphone as below if video visit  3. Advise patient to be prepared with any vital sign or heart rhythm information, their current medicines, and a piece of paper and pen handy for any instructions they may receive the day of their visit  4. Inform patient they will receive a phone call 15 minutes prior to their appointment time (may be from unknown caller ID) so they should be prepared to answer  5. Confirm that appointment type is correct in Epic appointment notes (video vs telephone)    TELEPHONE CALL NOTE  Paula Luna has been deemed a candidate for a follow-up tele-health visit to limit community exposure during the Covid-19 pandemic. I spoke with the patient via phone to ensure availability of phone/video source, confirm preferred email & phone number, and discuss  instructions and expectations.  I reminded Paula Luna to be prepared with any vital sign and/or heart rhythm information that could potentially be obtained via home monitoring, at the time of her visit. I reminded Paula Luna to expect a phone call at the time of her visit if her visit.  Did the patient verbally acknowledge consent to treatment? Yes  Norman Herrlich 12/10/2018 2:47 PM   DOWNLOADING THE WEBEX SOFTWARE TO SMARTPHONE  - If Apple, go to [employer] and type in WebEx in the search bar. Download [employer], the blue/green circle. The app is free but as with any other app downloads, their phone may require them to verify saved payment information or Apple password. The patient does NOT have to create an account.  - If Android, ask patient to go to [employer] and type in WebEx in the search bar. Download [employer], the blue/green circle. The app is free but as with any other app downloads, their phone may require them to verify saved payment information or Android password. The patient does NOT have to create an account.   CONSENT FOR TELE-HEALTH VISIT - PLEASE REVIEW  I hereby voluntarily request, consent and authorize [facility] and its employed or contracted physicians, physician assistants, nurse practitioners or other licensed health care professionals (the Practitioner), to provide me with telemedicine health care services (the Services") as deemed necessary by the treating Practitioner. I acknowledge  and consent to receive the Services by the Practitioner via telemedicine. I understand that the telemedicine visit will involve communicating with the Practitioner through live audiovisual communication technology and the disclosure of certain medical information by electronic transmission. I acknowledge that I have been given the opportunity to request an in-person assessment or other available alternative prior to the telemedicine visit and am  voluntarily participating in the telemedicine visit.  I understand that I have the right to withhold or withdraw my consent to the use of telemedicine in the course of my care at any time, without affecting my right to future care or treatment, and that the Practitioner or I may terminate the telemedicine visit at any time. I understand that I have the right to inspect all information obtained and/or recorded in the course of the telemedicine visit and may receive copies of available information for a reasonable fee.  I understand that some of the potential risks of receiving the Services via telemedicine include:   Delay or interruption in medical evaluation due to technological equipment failure or disruption;  Information transmitted may not be sufficient (e.g. poor resolution of images) to allow for appropriate medical decision making by the Practitioner; and/or   In rare instances, security protocols could fail, causing a breach of personal health information.  Furthermore, I acknowledge that it is my responsibility to provide information about my medical history, conditions and care that is complete and accurate to the best of my ability. I acknowledge that Practitioner's advice, recommendations, and/or decision may be based on factors not within their control, such as incomplete or inaccurate data provided by me or distortions of diagnostic images or specimens that may result from electronic transmissions. I understand that the practice of medicine is not an exact science and that Practitioner makes no warranties or guarantees regarding treatment outcomes. I acknowledge that I will receive a copy of this consent concurrently upon execution via email to the email address I last provided but may also request a printed copy by calling the office of [place].    I understand that my insurance will be billed for this visit.   I have read or had this consent read to me.  I understand the  contents of this consent, which adequately explains the benefits and risks of the Services being provided via telemedicine.   I have been provided ample opportunity to ask questions regarding this consent and the Services and have had my questions answered to my satisfaction.  I give my informed consent for the services to be provided through the use of telemedicine in my medical care  By participating in this telemedicine visit I agree to the above.

## 2018-12-12 NOTE — Progress Notes (Signed)
Virtual Visit via Telephone Note    Evaluation Performed:  Follow-up visit  This visit type was conducted due to national recommendations for restrictions regarding the COVID-19 Pandemic (e.g. social distancing).  This format is felt to be most appropriate for this patient at this time.  All issues noted in this document were discussed and addressed.  No physical exam was performed (except for noted visual exam findings with Video Visits).  Verbal consent was obtained from the patient.  Date:  12/13/2018   ID:  Paula Luna, DOB 10-Jun-1925, MRN [id number]  Patient Location:  [address] [place] [address]  Provider location:   [place], [place]  PCP:  Hannah Beat, MD  Cardiologist:  Yvonne Kendall, MD  Electrophysiologist:  None   Chief Complaint:  Follow-up atrial fibrillation  History of Present Illness:    Paula Luna is a 85 y.o. female who presents via audio/video conferencing for a telehealth visit today.  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  We are speaking today for follow-up of her atrial fibrillation.  She was last seen in our office in 05/2018 by Ward Givens, NP.  At that time, she was doing well and continued to work with physical therapy to improve her strength and exercise capacity.  Today, Paula Luna (and her daughter, who is also on the phone line) reports improved strength and energy over the last 6 months.  She has continued to work with physical therapy, though this is currently on hold in the setting of COVID-19.  She has occasional shortness of breath when walking extended distances at a rapid pace.  She denies chest pain, palpitations, and edema.  She endorses occasional orthostatic lightheadedness that lasts a second or two after sitting up from a recumbent position.  Paula Luna remains compliant with her medications, including  rivaroxaban.  She has not had any significant bleeding.  She is currently on lisinopril and metoprolol for control of her BP.  Home readings have been labile; her daughter questions the accuracy of her cuff, given that SBP is always around 180 mmHg and has not correlated with readings by healthcare providers.  Paula Luna reports a good appetite and is also trying to stay well-hydrates.  The patient does not have symptoms concerning for COVID-19 infection (fever, chills, cough, or new shortness of breath).  She had URI symptoms in January, which resolved after a course of azithromycin.  Prior CV studies:   The following studies were reviewed today:  TTE (07/13/2017): Normal LV size with mild LVH.  LVEF 60-65%.  Mild MR.  Mild to moderate left atrial enlargement.  Normal RV size and function.  48-hour Holter monitor (07/11/2017): A-fib wih variable HR response (39-140 bpm; average 66 bpm).  Occasional PVC's versus aberrancy.  Past Medical History:  Diagnosis Date  . Anemia   . Back pain   . Hypertension 12/25/2013  . Ischemic bowel disease (HCC)    a. 03/2017 abd pain-->internal herina and ischemic jejunum s/p ex lap and small bowel resection.  . Macular degeneration of left eye 01/06/2015  . Osteoporosis   . Persistent atrial fibrillation    a. First noted 03/2017;  b. 07/2017 Echo: EF 60-65%, no rwma, mild MR, mildly to mod dil LA; c. 07/2017 48hr Holter: persistent Afib, avg HR of 66 (range 39-140 bpm);  d. CHA2DS2VASc = 4-->Xarelto initiated 07/2017.   Past Surgical History:  Procedure Laterality Date  .  CLOSED REDUCTION PROXIMAL HUMERUS FRACTURE Left 2017  . LAPAROSCOPIC SMALL BOWEL RESECTION    . LAPAROTOMY N/A 03/18/2017   Procedure: EXPLORATORY LAPAROTOMY, SMALL BOWEL RESECTION;  Surgeon: Leafy Ro, MD;  Location: [facility] ORS;  Service: General;  Laterality: N/A;  . TOTAL ABDOMINAL HYSTERECTOMY       Current Meds  Medication Sig  . docusate sodium (COLACE) 100 MG capsule Take 200  mg by mouth daily.   . fluticasone (FLONASE) 50 MCG/ACT nasal spray Place 2 sprays into both nostrils daily.  Marland Kitchen lisinopril (PRINIVIL,ZESTRIL) 20 MG tablet Take 1 tablet (20 mg total) by mouth daily.  . metoprolol succinate (TOPROL-XL) 25 MG 24 hr tablet Take 1 tablet by mouth once daily  . Multiple Vitamins-Minerals (ICAPS) CAPS Take 1 capsule by mouth 2 (two) times daily.   . NON FORMULARY Vitamin D 50 mcg, take one daily by mouth  . rivaroxaban (XARELTO) 20 MG TABS tablet Take 1 tablet (20 mg total) by mouth daily with supper.     Allergies:   Latex; Tetanus toxoid; and Alendronate sodium   Social History   Tobacco Use  . Smoking status: Former Games developer  . Smokeless tobacco: Never Used  Substance Use Topics  . Alcohol use: No  . Drug use: No     Family Hx: The patient's family history includes Cancer in her father; Diabetes in her mother.  ROS:   Please see the history of present illness.   All other systems reviewed and are negative.   Labs/Other Tests and Data Reviewed:    Recent Labs: 02/13/2018: TSH 1.29 08/22/2018: ALT 9; BUN 18; Creatinine, Ser 0.87; Hemoglobin 13.6; Platelets 207.0; Potassium 4.0; Sodium 139   Recent Lipid Panel Lab Results  Component Value Date/Time   CHOL 169 02/13/2018 12:54 PM   TRIG 73.0 02/13/2018 12:54 PM   HDL 60.80 02/13/2018 12:54 PM   CHOLHDL 3 02/13/2018 12:54 PM   LDLCALC 94 02/13/2018 12:54 PM   LDLDIRECT 101.2 05/20/2009 12:00 AM    Wt Readings from Last 3 Encounters:  12/13/18 129 lb (58.5 kg)  09/24/18 129 lb (58.5 kg)  08/22/18 131 lb (59.4 kg)     Objective:    Vital Signs:  Ht 4\' 11"  (1.499 m)   Wt 129 lb (58.5 kg)   BMI 26.05 kg/m    ASSESSMENT & PLAN:    Permanent atrial fibrillation: Other than occasional orthostatic lightheadedness and exertional dyspnea when walking briskly over extended distances, Paula Luna remains asymptomatic.  We do not have any heart rate readings today, but Paula Luna's daughter reports  normal HR and BP readings when her mother was workup with PT up until mid-March.  I think it is reasonable to continue current doses of metoprolol and rivaroxban.  Hypertension: Home BP readings do not seem accurate.  Readings with PT up until last month were upper normal to mildly elevated.  In the setting of the patient's age and occasional orthostatic lightheadedness, I favor mild permissive hypertension.  No medication changes planned today.  COVID-19 Education: The signs and symptoms of COVID-19 were discussed with the patient and how to seek care for testing (follow up with PCP or arrange E-visit).  The importance of social distancing was discussed today.  Patient Risk:   After full review of this patient's clinical status, I feel that they are at least moderate risk at this time.  Time:   Today, I have spent 15 minutes with the patient with telehealth technology discussing atrial fibrillation, deconditioning,  and COVID-19 precautions.     Medication Adjustments/Labs and Tests Ordered: Current medicines are reviewed at length with the patient today.  Concerns regarding medicines are outlined above.   Tests Ordered: None.  Medication Changes: None.  Disposition:  Follow up in 6 month(s)  Signed, Yvonne Kendallhristopher Reshad Saab, MD  12/13/2018 3:04 PM    [facility]

## 2018-12-13 ENCOUNTER — Telehealth (INDEPENDENT_AMBULATORY_CARE_PROVIDER_SITE_OTHER): Payer: PPO | Admitting: Internal Medicine

## 2018-12-13 ENCOUNTER — Other Ambulatory Visit: Payer: Self-pay

## 2018-12-13 ENCOUNTER — Encounter: Payer: Self-pay | Admitting: Internal Medicine

## 2018-12-13 VITALS — Ht 59.0 in | Wt 129.0 lb

## 2018-12-13 DIAGNOSIS — I10 Essential (primary) hypertension: Secondary | ICD-10-CM

## 2018-12-13 DIAGNOSIS — I4821 Permanent atrial fibrillation: Secondary | ICD-10-CM

## 2018-12-13 NOTE — Patient Instructions (Signed)
Medication Instructions:  Your physician recommends that you continue on your current medications as directed. Please refer to the Current Medication list given to you today.  If you need a refill on your cardiac medications before your next appointment, please call your pharmacy.   Lab work: none If you have labs (blood work) drawn today and your tests are completely normal, you will receive your results only by: . MyChart Message (if you have MyChart) OR . A paper copy in the mail If you have any lab test that is abnormal or we need to change your treatment, we will call you to review the results.  Testing/Procedures: none  Follow-Up: At [facility], you and your health needs are our priority.  As part of our continuing mission to provide you with exceptional heart care, we have created designated Provider Care Teams.  These Care Teams include your primary Cardiologist (physician) and Advanced Practice Providers (APPs -  Physician Assistants and Nurse Practitioners) who all work together to provide you with the care you need, when you need it. You will need a follow up appointment in 6 months.  Please call our office 2 months in advance to schedule this appointment.  You may see Christopher End, MD or one of the following Advanced Practice Providers on your designated Care Team:   Christopher Berge, NP Ryan Dunn, PA-C . Jacquelyn Visser, PA-C     

## 2018-12-24 NOTE — Therapy (Signed)
[facility] MAIN [facility] SERVICES [address] [place], [place], [address] Phone: [phone]   Fax:  [phone]  Patient Details  Name: Paula Luna MRN: [id number] Date of Birth: Oct 20, 1924 Referring Provider:  No ref. provider found  Encounter Date: 12/24/2018  The [facility] outpatient clinics are closed at this time due to the COVID-19 epidemic. The patient was contacted in regards to their therapy services. The patient is in agreement that they are safe and consent to being on hold for therapy services until the [facility] outpatient facilities reopen. At which time, the patient will be contacted to schedule an appointment to resume therapy services.   Patient refused Telehealth or Home health.  Myrene Galas, PT DPT 12/24/2018, 12:03 PM  [place] [facility] MAIN [facility] SERVICES [address] [place], [place], [address] Phone: [phone]   Fax:  [phone]

## 2019-02-05 ENCOUNTER — Other Ambulatory Visit: Payer: Self-pay

## 2019-02-05 ENCOUNTER — Ambulatory Visit: Payer: PPO | Attending: Family Medicine

## 2019-02-05 DIAGNOSIS — R262 Difficulty in walking, not elsewhere classified: Secondary | ICD-10-CM | POA: Insufficient documentation

## 2019-02-05 DIAGNOSIS — M6281 Muscle weakness (generalized): Secondary | ICD-10-CM | POA: Diagnosis not present

## 2019-02-05 NOTE — Therapy (Signed)
[facility] PHYSICAL AND SPORTS MEDICINE [address] [place], [place], [address] Phone: [phone]   Fax:  [phone]  Physical Therapy Treatment  The patient has been informed of current processes in place at Outpatient Rehab to protect patients from Covid-19 exposure including social distancing, schedule modifications, and new cleaning procedures. After discussing their particular risk with a therapist based on the patient's personal risk factors, the patient has decided to proceed with in-person therapy.  Patient Details  Name: Paula CornsRoxanne D Luna MRN: [id number] Date of Birth: [birth year] No data recorded  Encounter Date: 02/05/2019  PT End of Session - 02/05/19 1651    Visit Number  50    Number of Visits  60    Date for PT Re-Evaluation  03/19/19    PT Start Time  1600    PT Stop Time  1645    PT Time Calculation (min)  45 min    Equipment Utilized During Treatment  Gait belt    Activity Tolerance  Patient tolerated treatment well    Behavior During Therapy  WFL for tasks assessed/performed       Past Medical History:  Diagnosis Date  . Anemia   . Back pain   . Hypertension 12/25/2013  . Ischemic bowel disease (HCC)    a. 03/2017 abd pain-->internal herina and ischemic jejunum s/p ex lap and small bowel resection.  . Macular degeneration of left eye 01/06/2015  . Osteoporosis   . Persistent atrial fibrillation    a. First noted 03/2017;  b. 07/2017 Echo: EF 60-65%, no rwma, mild MR, mildly to mod dil LA; c. 07/2017 48hr Holter: persistent Afib, avg HR of 66 (range 39-140 bpm);  d. CHA2DS2VASc = 4-->Xarelto initiated 07/2017.    Past Surgical History:  Procedure Laterality Date  . CLOSED REDUCTION PROXIMAL HUMERUS FRACTURE Left 2017  . LAPAROSCOPIC SMALL BOWEL RESECTION    . LAPAROTOMY N/A 03/18/2017   Procedure: EXPLORATORY LAPAROTOMY, SMALL BOWEL RESECTION;  Surgeon: Leafy RoPabon, Diego F, MD;  Location: [facility] ORS;  Service: General;   Laterality: N/A;  . TOTAL ABDOMINAL HYSTERECTOMY      There were no vitals filed for this visit.  Subjective Assessment - 02/05/19 1615    Subjective  Patient states she has been performing a lot of walking at home but reports improvement with stabilization with balance. Patient reports she would like to continue to work on her balance.     Pertinent History  Small bowel surgery     Limitations  Standing;Walking    Patient Stated Goals  To walk without feeling of falling     Currently in Pain?  No/denies       TREATMENT Therapeutic Exercise Hip abduction in standing with UE support -- x 10 B Nustep in sitting level 4 -- x 6 min to improve LE strength Ambulation 65600ft with rollator timed for speed focused on improving speed/strength Forward up and overs with quad cane -- x 10 Side step ups with 6" step with UE support B -- x 10 Forward step ups with 6" step with UE support -- x 12 BP measured: 156/3173mmHg    Patient demonstrates increased fatigue at the end of the session. Performed exercises to build LE strength and improve ability to perfrom step ups at home to more easily enter the second story of her house.   PT Education - 02/05/19 1650    Education provided  Yes    Education Details  form/technique with exercise  Person(s) Educated  Patient    Methods  Explanation;Demonstration    Comprehension  Verbalized understanding;Returned demonstration          PT Long Term Goals - 02/05/19 1651      PT LONG TERM GOAL #1   Title  Patient will be independent with HEP focused on decreasing fall risk to continue benefits of therapy after discharge.     Baseline  Dependent with exercise performance; 01/01/2018: moderate cueing for performance. 03/28/18 min-mod cueing required; 04/30/18: min cueing required; 09/24/2018: min cueing required; 11/12/2018: Patient performs exercise with little to no cueing.     Time  6    Period  Weeks    Status  Achieved      PT LONG TERM GOAL #2    Title  Patient will improve TUG to under 12 sec to indicate singificant decrease in fall risk.     Baseline  20sec; 01/01/2018: 14sec; 02/12/2018; 11.59 sec;    Time  8    Period  Weeks    Status  Achieved      PT LONG TERM GOAL #3   Title  Patient will improve her ability to perform to 61m/s to indicate increased safety with walking at community ambulation    Baseline  .31m/s; 01/01/2018: .52m/s; 02/12/2018: .45m/s; 03/28/18 .94 m/s; 8/19/9: testing of goals deferred to next session; 06/28/2018: .93m/s; 09/24/2018: deferred; 10/08/2018: .85 m/s; 11/12/2018:  .89 m/s; 02/05/2019: .79m/s    Time  8    Period  Weeks    Status  On-going      PT LONG TERM GOAL #4   Title  Patient will improve 5XSTS to under 16 sec to indicate significant improvement LE functional strength and decrease fall risk.    Baseline  30sec; 01/01/2018: 12 sec    Time  8    Period  Weeks    Status  Achieved      PT LONG TERM GOAL #5   Title  Pt will ambulate 549ft in 3 minutes to demonstrate improved endurance with walking into department stores from the parking lot.     Baseline  03/28/18: 357ft; 04/30/18: testing of goals deferred to next session; 06/28/2018: 238ft; 09/24/2018: deferred; 10/08/2018: 279ft; 11/12/2018: 573ft with rollator; 02/04/2019: 574ft    Time  6    Period  Weeks    Status  On-going            Plan - 02/05/19 1652    Clinical Impression Statement  Patient demonstrates no declince in symptoms although being away from physical therapy for the past 2 months indicating patient maintaining function with HEP. Although patient has maintained function, she continues to demonstrates decreased balance with inability to stand on one foot or in tandem stance limiting ability to walk in narrow hallways. Patient also demosntrates increased difficulty with performing exercises involving step ups indicating poro quad strength and dynamic balance. Patient will benefit from further skilled therapy to return to prior level  of function.     Rehab Potential  Fair    Clinical Impairments Affecting Rehab Potential  (+) family support, decreased coormorbities (-) age    PT Frequency  1x / week    PT Duration  6 weeks    PT Treatment/Interventions  Balance training;Neuromuscular re-education;Gait training;Patient/family education;Therapeutic exercise;Therapeutic activities;Functional mobility training;Stair training;Moist Heat;Aquatic Therapy;Electrical Stimulation;Cryotherapy;Manual techniques    PT Next Visit Plan  Attempt more dynamic balance tasks, ambulation over obstacles, ambulation with head turns, stepping response tasks  PT Home Exercise Plan  See education    Consulted and Agree with Plan of Care  Patient       Patient will benefit from skilled therapeutic intervention in order to improve the following deficits and impairments:  Abnormal gait, Decreased coordination, Decreased endurance, Decreased activity tolerance, Decreased strength, Decreased range of motion, Decreased balance, Difficulty walking  Visit Diagnosis: Difficulty in walking, not elsewhere classified  Muscle weakness (generalized)     Problem List Patient Active Problem List   Diagnosis Date Noted  . Atrial fibrillation (HCC) 04/26/2017  . Anemia 04/26/2017  . 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 Macular degeneration of left eye 01/06/2015  . Essential hypertension 12/25/2013  . BACK PAIN 08/02/2010    Myrene Galas, PT DPT 02/05/2019, 5:00 PM  [place] [facility] REGIONAL [facility] PHYSICAL AND SPORTS MEDICINE [address], [place], [address] Phone: [phone]   Fax:  [phone]  Name: Paula Luna MRN: [id number] Date of Birth: [birth year]

## 2019-02-11 ENCOUNTER — Other Ambulatory Visit: Payer: Self-pay

## 2019-02-11 ENCOUNTER — Ambulatory Visit: Payer: PPO | Attending: Family Medicine

## 2019-02-11 DIAGNOSIS — R262 Difficulty in walking, not elsewhere classified: Secondary | ICD-10-CM | POA: Insufficient documentation

## 2019-02-11 DIAGNOSIS — M6281 Muscle weakness (generalized): Secondary | ICD-10-CM | POA: Insufficient documentation

## 2019-02-11 NOTE — Therapy (Signed)
[facility] [facility] PHYSICAL AND SPORTS MEDICINE [address] [place], [place], [address] Phone: [phone]   Fax:  [phone]  Physical Therapy Treatment  Patient Details  Name: Paula Luna MRN: [id number] Date of Birth: 05-Apr-1925 No data recorded  Encounter Date: 02/11/2019  PT End of Session - 02/11/19 1656    Visit Number  51    Number of Visits  60    Date for PT Re-Evaluation  03/19/19    PT Start Time  1600    PT Stop Time  1645    PT Time Calculation (min)  45 min    Equipment Utilized During Treatment  Gait belt    Activity Tolerance  Patient tolerated treatment well    Behavior During Therapy  WFL for tasks assessed/performed       Past Medical History:  Diagnosis Date  . Anemia   . Back pain   . Hypertension 12/25/2013  . Ischemic bowel disease (HCC)    a. 03/2017 abd pain-->internal herina and ischemic jejunum s/p ex lap and small bowel resection.  . Macular degeneration of left eye 01/06/2015  . Osteoporosis   . Persistent atrial fibrillation    a. First noted 03/2017;  b. 07/2017 Echo: EF 60-65%, no rwma, mild MR, mildly to mod dil LA; c. 07/2017 48hr Holter: persistent Afib, avg HR of 66 (range 39-140 bpm);  d. CHA2DS2VASc = 4-->Xarelto initiated 07/2017.    Past Surgical History:  Procedure Laterality Date  . CLOSED REDUCTION PROXIMAL HUMERUS FRACTURE Left 2017  . LAPAROSCOPIC SMALL BOWEL RESECTION    . LAPAROTOMY N/A 03/18/2017   Procedure: EXPLORATORY LAPAROTOMY, SMALL BOWEL RESECTION;  Surgeon: Leafy RoPabon, Diego F, MD;  Location: [facility] ORS;  Service: General;  Laterality: N/A;  . TOTAL ABDOMINAL HYSTERECTOMY      There were no vitals filed for this visit.  Subjective Assessment - 02/11/19 1556    Subjective  Patient states she has been having difficulty with breathing with her mask on. Patient states she has been walking and ascending and desending the stairs.     Pertinent History  Small bowel surgery     Limitations   Standing;Walking    Patient Stated Goals  To walk without feeling of falling     Currently in Pain?  No/denies        TREATMENT Therapeutic Exercise  Nustep in the beginning of the session - x 10min level 5 for 2 min, level 3 for 8 min Side stepping up and over 6" step - x 20  Forward stepping up and over 6" step - x20 Side stepping in agility ladder - x 1 down and back  Forward stepping in agility ladder (two foot in each square) - x 1 down and back Forward stepping in agility ladder (one foot in each square) - x 1 down and back  Performed exercises to address balance difficulty and fear of falling. Patient demonstrates limitations with balance confidence and performed exercises to address this.    PT Education - 02/11/19 1602    Education provided  Yes    Education Details  form/technique with exercise    Person(s) Educated  Patient    Methods  Explanation;Demonstration    Comprehension  Verbalized understanding;Returned demonstration          PT Long Term Goals - 02/05/19 1651      PT LONG TERM GOAL #1   Title  Patient will be independent with HEP focused on decreasing fall risk  to continue benefits of therapy after discharge.     Baseline  Dependent with exercise performance; 01/01/2018: moderate cueing for performance. 03/28/18 min-mod cueing required; 04/30/18: min cueing required; 09/24/2018: min cueing required; 11/12/2018: Patient performs exercise with little to no cueing.     Time  6    Period  Weeks    Status  Achieved      PT LONG TERM GOAL #2   Title  Patient will improve TUG to under 12 sec to indicate singificant decrease in fall risk.     Baseline  20sec; 01/01/2018: 14sec; 02/12/2018; 11.59 sec;    Time  8    Period  Weeks    Status  Achieved      PT LONG TERM GOAL #3   Title  Patient will improve her ability to perform to 94m/s to indicate increased safety with walking at community ambulation    Baseline  .4m/s; 01/01/2018: .24m/s; 02/12/2018: .5m/s;  03/28/18 .94 m/s; 8/19/9: testing of goals deferred to next session; 06/28/2018: .39m/s; 09/24/2018: deferred; 10/08/2018: .85 m/s; 11/12/2018:  .89 m/s; 02/05/2019: .37m/s    Time  8    Period  Weeks    Status  On-going      PT LONG TERM GOAL #4   Title  Patient will improve 5XSTS to under 16 sec to indicate significant improvement LE functional strength and decrease fall risk.    Baseline  30sec; 01/01/2018: 12 sec    Time  8    Period  Weeks    Status  Achieved      PT LONG TERM GOAL #5   Title  Pt will ambulate 563ft in 3 minutes to demonstrate improved endurance with walking into department stores from the parking lot.     Baseline  03/28/18: 319ft; 04/30/18: testing of goals deferred to next session; 06/28/2018: 250ft; 09/24/2018: deferred; 10/08/2018: 239ft; 11/12/2018: 562ft with rollator; 02/04/2019: 53ft    Time  6    Period  Weeks    Status  On-going            Plan - 02/11/19 1657    Clinical Impression Statement  Focused more on ambulation and taking longer steps when ambulating. Patient continues to demonstrate increased fear with performing exercises without UE support and requires some UE support to improve confidence to do exercises although she isn't bearing much weight through the UE. Attempted to address performing breathing to decreased sympathetic NS activation. Patient will benefit from further skilled therapy focused on improving limitations to return to prior level of function.     Rehab Potential  Fair    Clinical Impairments Affecting Rehab Potential  (+) family support, decreased coormorbities (-) age    PT Frequency  1x / week    PT Duration  6 weeks    PT Treatment/Interventions  Balance training;Neuromuscular re-education;Gait training;Patient/family education;Therapeutic exercise;Therapeutic activities;Functional mobility training;Stair training;Moist Heat;Aquatic Therapy;Electrical Stimulation;Cryotherapy;Manual techniques    PT Next Visit Plan  Attempt more  dynamic balance tasks, ambulation over obstacles, ambulation with head turns, stepping response tasks    PT Home Exercise Plan  See education    Consulted and Agree with Plan of Care  Patient       Patient will benefit from skilled therapeutic intervention in order to improve the following deficits and impairments:  Abnormal gait, Decreased coordination, Decreased endurance, Decreased activity tolerance, Decreased strength, Decreased range of motion, Decreased balance, Difficulty walking  Visit Diagnosis: Muscle weakness (generalized)  Difficulty in walking, not elsewhere  classified     Problem List Patient Active Problem List   Diagnosis Date Noted  . Atrial fibrillation (HCC) 04/26/2017  . Anemia 04/26/2017  . 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 Macular degeneration of left eye 01/06/2015  . Essential hypertension 12/25/2013  . BACK PAIN 08/02/2010    Myrene Galas, PT DPT 02/11/2019, 5:01 PM  [facility] REGIONAL [facility] PHYSICAL AND SPORTS MEDICINE [address] [place], [address] Phone: [phone]   Fax:  [phone]  Name: Paula Luna MRN: [id number] Date of Birth: April 06, 1925

## 2019-02-18 ENCOUNTER — Ambulatory Visit: Payer: PPO

## 2019-02-18 ENCOUNTER — Ambulatory Visit (INDEPENDENT_AMBULATORY_CARE_PROVIDER_SITE_OTHER): Payer: PPO

## 2019-02-18 ENCOUNTER — Other Ambulatory Visit (INDEPENDENT_AMBULATORY_CARE_PROVIDER_SITE_OTHER): Payer: PPO

## 2019-02-18 DIAGNOSIS — Z0000 Encounter for general adult medical examination without abnormal findings: Secondary | ICD-10-CM

## 2019-02-18 DIAGNOSIS — I10 Essential (primary) hypertension: Secondary | ICD-10-CM | POA: Diagnosis not present

## 2019-02-18 DIAGNOSIS — E559 Vitamin D deficiency, unspecified: Secondary | ICD-10-CM

## 2019-02-18 LAB — CBC WITH DIFFERENTIAL/PLATELET
Basophils Absolute: 0 10*3/uL (ref 0.0–0.1)
Basophils Relative: 0.3 % (ref 0.0–3.0)
Eosinophils Absolute: 0.1 10*3/uL (ref 0.0–0.7)
Eosinophils Relative: 2.2 % (ref 0.0–5.0)
HCT: 39.5 % (ref 36.0–46.0)
Hemoglobin: 12.9 g/dL (ref 12.0–15.0)
Lymphocytes Relative: 32.2 % (ref 12.0–46.0)
Lymphs Abs: 1.8 10*3/uL (ref 0.7–4.0)
MCHC: 32.7 g/dL (ref 30.0–36.0)
MCV: 78.7 fl (ref 78.0–100.0)
Monocytes Absolute: 0.5 10*3/uL (ref 0.1–1.0)
Monocytes Relative: 8 % (ref 3.0–12.0)
Neutro Abs: 3.3 10*3/uL (ref 1.4–7.7)
Neutrophils Relative %: 57.3 % (ref 43.0–77.0)
Platelets: 180 10*3/uL (ref 150.0–400.0)
RBC: 5.02 Mil/uL (ref 3.87–5.11)
RDW: 16.2 % — ABNORMAL HIGH (ref 11.5–15.5)
WBC: 5.7 10*3/uL (ref 4.0–10.5)

## 2019-02-18 LAB — LIPID PANEL
Cholesterol: 169 mg/dL (ref 0–200)
HDL: 61.3 mg/dL (ref >39.00)
LDL Cholesterol: 91 mg/dL (ref 0–99)
NonHDL: 107.66
Total CHOL/HDL Ratio: 3
Triglycerides: 85 mg/dL (ref 0.0–149.0)
VLDL: 17 mg/dL (ref 0.0–40.0)

## 2019-02-18 LAB — BASIC METABOLIC PANEL
BUN: 15 mg/dL (ref 6–23)
CO2: 28 mEq/L (ref 19–32)
Calcium: 9.2 mg/dL (ref 8.4–10.5)
Chloride: 106 mEq/L (ref 96–112)
Creatinine, Ser: 0.77 mg/dL (ref 0.40–1.20)
GFR: 70.05 mL/min (ref >60.00)
Glucose, Bld: 106 mg/dL — ABNORMAL HIGH (ref 70–99)
Potassium: 3.7 mEq/L (ref 3.5–5.1)
Sodium: 142 mEq/L (ref 135–145)

## 2019-02-18 LAB — VITAMIN D 25 HYDROXY (VIT D DEFICIENCY, FRACTURES): VITD: 41.08 ng/mL (ref 30.00–100.00)

## 2019-02-18 LAB — HEMOGLOBIN A1C: Hgb A1c MFr Bld: 5.8 % (ref 4.6–6.5)

## 2019-02-18 NOTE — Progress Notes (Signed)
Subjective:   Paula Luna is a 85 y.o. female who presents for Medicare Annual (Subsequent) preventive examination.  Review of Systems:  N/A Cardiac Risk Factors include: advanced age (>12men, >11 women);hypertension     Objective:     Vitals: There were no vitals taken for this visit.  There is no height or weight on file to calculate BMI.  Advanced Directives 02/18/2019 02/13/2018 11/23/2017 03/27/2017 03/18/2017 03/18/2017 01/23/2017  Does Patient Have a Medical Advance Directive? Yes Yes No Yes No No Yes  Type of Paramedic of [place];Living will [place];Living will - [place];Living will - - [place];Living will  Does patient want to make changes to medical advance directive? - - - No - Patient declined - - -  Copy of [place] in Chart? No - copy requested No - copy requested - No - copy requested - - No - copy requested  Would patient like information on creating a medical advance directive? - - - No - Patient declined No - Patient declined No - Patient declined -    Tobacco Social History   Tobacco Use  Smoking Status Former Smoker  Smokeless Tobacco Never Used     Counseling given: No   Clinical Intake:  Pre-visit preparation completed: Yes  Pain : No/denies pain Pain Score: 0-No pain     Nutritional Status: BMI 25 -29 Overweight Nutritional Risks: None Diabetes: No  How often do you need to have someone help you when you read instructions, pamphlets, or other written materials from your doctor or pharmacy?: 1 - Never What is the last grade level you completed in school?: 11th grade  Interpreter Needed?: No  Comments: pt lives with her daughter  Information entered by :: LPinson, LPN  Past Medical History:  Diagnosis Date  . Anemia   . Back pain   . Hypertension 12/25/2013  . Ischemic bowel disease ([place])    a. 03/2017 abd pain-->internal herina and  ischemic jejunum s/p ex lap and small bowel resection.  . Macular degeneration of left eye 01/06/2015  . Osteoporosis   . Persistent atrial fibrillation    a. First noted 03/2017;  b. 07/2017 Echo: EF 60-65%, no rwma, mild MR, mildly to mod dil LA; c. 07/2017 48hr Holter: persistent Afib, avg HR of 66 (range 39-140 bpm);  d. CHA2DS2VASc = 4-->Xarelto initiated 07/2017.   Past Surgical History:  Procedure Laterality Date  . CLOSED REDUCTION PROXIMAL HUMERUS FRACTURE Left 2017  . LAPAROSCOPIC SMALL BOWEL RESECTION    . LAPAROTOMY N/A 03/18/2017   Procedure: EXPLORATORY LAPAROTOMY, SMALL BOWEL RESECTION;  Surgeon: Jules Husbands, MD;  Location: [facility] ORS;  Service: General;  Laterality: N/A;  . TOTAL ABDOMINAL HYSTERECTOMY     Family History  Problem Relation Age of Onset  . Diabetes Mother   . Cancer Father        jaw   Social History   Socioeconomic History  . Marital status: Widowed    Spouse name: Not on file  . Number of children: Not on file  . Years of education: Not on file  . Highest education level: Not on file  Occupational History  . Occupation: Radiographer, therapeutic: Sells: 3 days a week  Social Needs  . Financial resource strain: Not on file  . Food insecurity:    Worry: Not on file    Inability: Not on file  .  Transportation needs:    Medical: Not on file    Non-medical: Not on file  Tobacco Use  . Smoking status: Former Games developermoker  . Smokeless tobacco: Never Used  Substance and Sexual Activity  . Alcohol use: No  . Drug use: No  . Sexual activity: Not Currently  Lifestyle  . Physical activity:    Days per week: Not on file    Minutes per session: Not on file  . Stress: Not on file  Relationships  . Social connections:    Talks on phone: Not on file    Gets together: Not on file    Attends religious service: Not on file    Active member of club or organization: Not on file    Attends meetings of clubs or organizations: Not on file     Relationship status: Not on file  Other Topics Concern  . Not on file  Social History Narrative   Widowed after 64 years   Working some at [employer]   Active in [employer]   Lives with daughter    Outpatient Encounter Medications as of 02/18/2019  Medication Sig  . docusate sodium (COLACE) 100 MG capsule Take 200 mg by mouth daily.   . fluticasone (FLONASE) 50 MCG/ACT nasal spray Place 2 sprays into both nostrils daily.  Marland Kitchen. lisinopril (PRINIVIL,ZESTRIL) 20 MG tablet Take 1 tablet (20 mg total) by mouth daily.  . metoprolol succinate (TOPROL-XL) 25 MG 24 hr tablet Take 1 tablet by mouth once daily  . Multiple Vitamins-Minerals (ICAPS) CAPS Take 1 capsule by mouth 2 (two) times daily.   . NON FORMULARY Vitamin D 50 mcg, take one daily by mouth  . rivaroxaban (XARELTO) 20 MG TABS tablet Take 1 tablet (20 mg total) by mouth daily with supper.   No facility-administered encounter medications on file as of 02/18/2019.     Activities of Daily Living In your present state of health, do you have any difficulty performing the following activities: 02/18/2019  Hearing? N  Vision? Y  Difficulty concentrating or making decisions? N  Walking or climbing stairs? Y  Dressing or bathing? N  Doing errands, shopping? Y  Preparing Food and eating ? Y  Using the Toilet? N  In the past six months, have you accidently leaked urine? N  Do you have problems with loss of bowel control? N  Managing your Medications? Y  Managing your Finances? Y  Housekeeping or managing your Housekeeping? Y  Some recent data might be hidden    Patient Care Team: Hannah Beatopland, Spencer, MD as PCP - General End, Cristal Deerhristopher, MD as PCP - Cardiology (Cardiology)    Assessment:   This is a routine wellness examination for Paula Luna.  Vision Screening Comments: Vision exam in Feb 2020   Exercise Activities and Dietary recommendations Current Exercise Habits: Home exercise routine;Structured exercise class, Type of exercise:  walking;Other - see comments;calisthenics(therapy), Time (Minutes): 30, Frequency (Times/Week): 3, Weekly Exercise (Minutes/Week): 90, Intensity: Mild, Exercise limited by: None identified  Goals    . Patient Stated     Starting 02/18/2019, I will continue to participate in rehab for 45 minutes once weekly and to walk for 3 days per week.        Fall Risk Fall Risk  02/18/2019 02/13/2018 01/23/2017 01/20/2016 01/05/2015  Falls in the past year? 0 No Yes Yes No  Comment - - pt had accidental fall in home; soreness to right hip; no medical treatment - -  Number falls in past yr: - -  1 1 -  Injury with Fall? - - Yes Yes -  Risk for fall due to : - - - - -  Risk for fall due to: Comment - - - - -    Depression Screen PHQ 2/9 Scores 02/18/2019 02/13/2018 01/23/2017 01/20/2016  PHQ - 2 Score 0 0 0 0  PHQ- 9 Score 0 0 - -     Cognitive Function MMSE - Mini Mental State Exam 02/18/2019 02/13/2018 01/23/2017  Orientation to time 5 5 5   Orientation to Place 5 5 5   Registration 3 3 3   Attention/ Calculation 0 0 0  Recall 3 3 3   Language- name 2 objects 0 0 0  Language- repeat 1 1 1   Language- follow 3 step command 0 3 3  Language- read & follow direction 0 0 0  Write a sentence 0 0 0  Copy design 0 0 0  Total score 17 20 20      PLEASE NOTE: A Mini-Cog screen was completed. Maximum score is 17. A value of 0 denotes this part of [place]stein MMSE was not completed or the patient failed this part of the Mini-Cog screening.   Mini-Cog Screening Orientation to Time - Max 5 pts Orientation to Place - Max 5 pts Registration - Max 3 pts Recall - Max 3 pts Language Repeat - Max 1 pts      Immunization History  Administered Date(s) Administered  . Influenza Split 07/12/2012, 06/28/2013  . Influenza Whole 08/20/2007, 05/10/2010  . Influenza, High Dose Seasonal PF 08/14/2014, 07/29/2015, 05/22/2016, 06/10/2017, 06/20/2018  . Pneumococcal Conjugate-13 12/25/2013  . Pneumococcal Polysaccharide-23 05/10/2010   . Zoster 10/15/2012  . Zoster Recombinat (Shingrix) 03/06/2018    Screening Tests Health Maintenance  Topic Date Due  . INFLUENZA VACCINE  04/13/2019  . DEXA SCAN  Completed  . PNA vac Low Risk Adult  Completed      Plan:     I have personally reviewed, addressed, and noted the following in the patient's chart:  A. Medical and social history B. Use of alcohol, tobacco or illicit drugs  C. Current medications and supplements D. Functional ability and status E.  Nutritional status F.  Physical activity G. Advance directives H. List of other physicians I.  Hospitalizations, surgeries, and ER visits in previous 12 months J.  Vitals (unless it is a telemedicine encounter) K. Screenings to include cognitive, depression, hearing, vision (NOTE: hearing and vision screenings not completed in telemedicine encounter) L. Referrals and appointments   In addition, I have reviewed and discussed with patient certain preventive protocols, quality metrics, and best practice recommendations. A written personalized care plan for preventive services and recommendations were provided to patient.  With patient's permission, we connected on 02/18/19 at 12:30 PM EDT by a video enabled telemedicine application. Two patient identifiers were used to ensure the encounter occurred with the correct person.    Patient was in home and writer was in office.   Signed,   Randa EvensLesia Tykesha Konicki, MHA, BS, LPN Health Coach

## 2019-02-18 NOTE — Patient Instructions (Signed)
Paula Luna , Thank you for taking time to come for your Medicare Wellness Visit. I appreciate your ongoing commitment to your health goals. Please review the following plan we discussed and let me know if I can assist you in the future.   These are the goals we discussed: Goals    . Patient Stated     Starting 02/18/2019, I will continue to participate in rehab for 45 minutes once weekly and to walk for 3 days per week.        This is a list of the screening recommended for you and due dates:  Health Maintenance  Topic Date Due  . Flu Shot  04/13/2019  . DEXA scan (bone density measurement)  Completed  . Pneumonia vaccines  Completed   Preventive Care for Adults  A healthy lifestyle and preventive care can promote health and wellness. Preventive health guidelines for adults include the following key practices.  . A routine yearly physical is a good way to check with your health care provider about your health and preventive screening. It is a chance to share any concerns and updates on your health and to receive a thorough exam.  . Visit your dentist for a routine exam and preventive care every 6 months. Brush your teeth twice a day and floss once a day. Good oral hygiene prevents tooth decay and gum disease.  . The frequency of eye exams is based on your age, health, family medical history, use  of contact lenses, and other factors. Follow your health care provider's recommendations for frequency of eye exams.  . Eat a healthy diet. Foods like vegetables, fruits, whole grains, low-fat dairy products, and lean protein foods contain the nutrients you need without too many calories. Decrease your intake of foods high in solid fats, added sugars, and salt. Eat the right amount of calories for you. Get information about a proper diet from your health care provider, if necessary.  . Regular physical exercise is one of the most important things you can do for your health. Most adults should  get at least 150 minutes of moderate-intensity exercise (any activity that increases your heart rate and causes you to sweat) each week. In addition, most adults need muscle-strengthening exercises on 2 or more days a week.  Silver Sneakers may be a benefit available to you. To determine eligibility, you may visit the website: www.silversneakers.com or contact program at 628-050-2309 Mon-Fri between 8AM-8PM.   . Maintain a healthy weight. The body mass index (BMI) is a screening tool to identify possible weight problems. It provides an estimate of body fat based on height and weight. Your health care provider can find your BMI and can help you achieve or maintain a healthy weight.   For adults 20 years and older: ? A BMI below 18.5 is considered underweight. ? A BMI of 18.5 to 24.9 is normal. ? A BMI of 25 to 29.9 is considered overweight. ? A BMI of 30 and above is considered obese.   . Maintain normal blood lipids and cholesterol levels by exercising and minimizing your intake of saturated fat. Eat a balanced diet with plenty of fruit and vegetables. Blood tests for lipids and cholesterol should begin at age 63 and be repeated every 5 years. If your lipid or cholesterol levels are high, you are over 50, or you are at high risk for heart disease, you may need your cholesterol levels checked more frequently. Ongoing high lipid and cholesterol levels should be treated  with medicines if diet and exercise are not working.  . If you smoke, find out from your health care provider how to quit. If you do not use tobacco, please do not start.  . If you choose to drink alcohol, please do not consume more than 2 drinks per day. One drink is considered to be 12 ounces (355 mL) of beer, 5 ounces (148 mL) of wine, or 1.5 ounces (44 mL) of liquor.  . If you are 65-23 years old, ask your health care provider if you should take aspirin to prevent strokes.  . Use sunscreen. Apply sunscreen liberally and  repeatedly throughout the day. You should seek shade when your shadow is shorter than you. Protect yourself by wearing long sleeves, pants, a wide-brimmed hat, and sunglasses year round, whenever you are outdoors.  . Once a month, do a whole body skin exam, using a mirror to look at the skin on your back. Tell your health care provider of new moles, moles that have irregular borders, moles that are larger than a pencil eraser, or moles that have changed in shape or color.

## 2019-02-18 NOTE — Progress Notes (Signed)
PCP notes:   Health maintenance:  None  Abnormal screenings:   None  Patient concerns:   None  Nurse concerns:  None  Next PCP appt:   03/25/19 @ 1130

## 2019-02-19 ENCOUNTER — Telehealth: Payer: Self-pay

## 2019-02-19 NOTE — Progress Notes (Signed)
I reviewed health advisor's note, was available for consultation, and agree with documentation and plan.   Signed,  Cheri Ayotte T. Chris Cripps, MD  

## 2019-02-19 NOTE — Telephone Encounter (Signed)
[name] (DPR signed) pt wanted to know if should be concerned about pts labs; advised [name] per result note Dr Lorelei Pont said would discuss at upcoming appt; pt has annual exam scheduled in office on 03/25/19 at 11:20. [name] said that is fine and if any questions prior [name] will cb. Nothing further needed.

## 2019-02-25 ENCOUNTER — Ambulatory Visit: Payer: PPO

## 2019-02-25 ENCOUNTER — Telehealth: Payer: Self-pay | Admitting: *Deleted

## 2019-02-25 ENCOUNTER — Encounter: Payer: PPO | Admitting: Family Medicine

## 2019-02-25 ENCOUNTER — Other Ambulatory Visit: Payer: Self-pay

## 2019-02-25 DIAGNOSIS — R262 Difficulty in walking, not elsewhere classified: Secondary | ICD-10-CM

## 2019-02-25 DIAGNOSIS — M6281 Muscle weakness (generalized): Secondary | ICD-10-CM | POA: Diagnosis not present

## 2019-02-25 NOTE — Telephone Encounter (Signed)
Patient's daughter called stating her mom is having problems with her bowels. [name] stated that patient takes 2 stool softners every day with plenty of water or juice and drinks a lot during the day. Patient's daughter stated that patient thinks that she has to have a BM everyday and she gets concerned if she does not. [name] stated that patient went 4 days without having a BM and finally had one yesterday. Patient's daughter stated that her mom told her that she was having some abdominal pain during the night, but felt better after urinating a lot. [name] wants to know if you have any recommendation as to how to help her mom have regular BM's? Pharmacy [facility]

## 2019-02-25 NOTE — Telephone Encounter (Signed)
Left detailed message on VM for daughter. 

## 2019-02-25 NOTE — Therapy (Signed)
[facility] [address], [place], [address] Phone: [phone]   Fax:  [phone]  Physical Therapy Treatment  Patient Details  Name: Paula Luna MRN: [id number] Date of Birth: 1925-02-09 No data recorded  Encounter Date: 02/25/2019  PT End of Session - 02/25/19 1613    Visit Number  52    Number of Visits  60    Date for PT Re-Evaluation  03/19/19    PT Start Time  1600    PT Stop Time  1645    PT Time Calculation (min)  45 min    Equipment Utilized During Treatment  Gait belt    Activity Tolerance  Patient tolerated treatment well    Behavior During Therapy  WFL for tasks assessed/performed       Past Medical History:  Diagnosis Date  . Anemia   . Back pain   . Hypertension 12/25/2013  . Ischemic bowel disease ([place])    a. 03/2017 abd pain-->internal herina and ischemic jejunum s/p ex lap and small bowel resection.  . Macular degeneration of left eye 01/06/2015  . Osteoporosis   . Persistent atrial fibrillation    a. First noted 03/2017;  b. 07/2017 Echo: EF 60-65%, no rwma, mild MR, mildly to mod dil LA; c. 07/2017 48hr Holter: persistent Afib, avg HR of 66 (range 39-140 bpm);  d. CHA2DS2VASc = 4-->Xarelto initiated 07/2017.    Past Surgical History:  Procedure Laterality Date  . CLOSED REDUCTION PROXIMAL HUMERUS FRACTURE Left 2017  . LAPAROSCOPIC SMALL BOWEL RESECTION    . LAPAROTOMY N/A 03/18/2017   Procedure: EXPLORATORY LAPAROTOMY, SMALL BOWEL RESECTION;  Surgeon: Jules Husbands, MD;  Location: [facility] ORS;  Service: General;  Laterality: N/A;  . TOTAL ABDOMINAL HYSTERECTOMY      There were no vitals filed for this visit.  Subjective Assessment - 02/25/19 1600    Subjective  Patient reports she been having dififcult going to the rest room and has been taking more stool softeners to combat this under the direction of her PCP.    Pertinent History  Small bowel surgery     Limitations   Standing;Walking    Patient Stated Goals  To walk without feeling of falling     Currently in Pain?  No/denies       TREATMENT Therapeutic Exercise Hip abduction in standing with UE support - 2 x 15 Hip extension in standing with UE support - 2 x 15 Nustep to improve strength and endurance in the UE/LE level 4 - x48min  Squats in standing with UE support - 3 x 10  Step ups with marches on the airex pad - x 5  Side stepping across airex pad with UE support - x 10 B Walking with focus on improving foot clearance in standing - 100 ft x 2   Performed exercises with airex pad to address balance concerns. Patient demonstrates difficulty with performing standing exercises with the airex pad secondary to difficulty with static balance.   PT Education - 02/25/19 1612    Education provided  Yes    Education Details  Form/tecnique with exercise    Person(s) Educated  Patient    Methods  Explanation;Demonstration    Comprehension  Verbalized understanding;Returned demonstration          PT Long Term Goals - 02/05/19 1651      PT LONG TERM GOAL #1   Title  Patient will be independent with HEP focused  on decreasing fall risk to continue benefits of therapy after discharge.     Baseline  Dependent with exercise performance; 01/01/2018: moderate cueing for performance. 03/28/18 min-mod cueing required; 04/30/18: min cueing required; 09/24/2018: min cueing required; 11/12/2018: Patient performs exercise with little to no cueing.     Time  6    Period  Weeks    Status  Achieved      PT LONG TERM GOAL #2   Title  Patient will improve TUG to under 12 sec to indicate singificant decrease in fall risk.     Baseline  20sec; 01/01/2018: 14sec; 02/12/2018; 11.59 sec;    Time  8    Period  Weeks    Status  Achieved      PT LONG TERM GOAL #3   Title  Patient will improve her ability to perform 10mWT to 7082m/s to indicate increased safety with walking at community ambulation    Baseline  .64482m/s; 01/01/2018:  .7845m/s; 02/12/2018: .6043m/s; 03/28/18 .94 m/s; 8/19/9: testing of goals deferred to next session; 06/28/2018: .8341m/s; 09/24/2018: deferred; 10/08/2018: .85 m/s; 11/12/2018:  .89 m/s; 02/05/2019: .8743m/s    Time  8    Period  Weeks    Status  On-going      PT LONG TERM GOAL #4   Title  Patient will improve 5XSTS to under 16 sec to indicate significant improvement LE functional strength and decrease fall risk.    Baseline  30sec; 01/01/2018: 12 sec    Time  8    Period  Weeks    Status  Achieved      PT LONG TERM GOAL #5   Title  Pt will ambulate 58100ft in 3 minutes to demonstrate improved endurance with walking into department stores from the parking lot.     Baseline  03/28/18: 37250ft; 04/30/18: testing of goals deferred to next session; 06/28/2018: 26250ft; 09/24/2018: deferred; 10/08/2018: 27100ft; 11/12/2018: 55550ft with rollator; 02/04/2019: 55050ft    Time  6    Period  Weeks    Status  On-going            Plan - 02/25/19 1623    Clinical Impression Statement  Continued to focus on general strengthening exercises with the patient as she continues to have difficulty walking for longer periods of time. Patient demonstrates improvement with ability to perform greater amount of sets with exercises compared to previous sessions indicating functional carryover between sessions. Patient is improving overall however, had one LOB during exercise requiring UE support to correct. Patient will benefit from further skilled therapy to return to prior level of function.    Rehab Potential  Fair    Clinical Impairments Affecting Rehab Potential  (+) family support, decreased coormorbities (-) age    PT Frequency  1x / week    PT Duration  6 weeks    PT Treatment/Interventions  Balance training;Neuromuscular re-education;Gait training;Patient/family education;Therapeutic exercise;Therapeutic activities;Functional mobility training;Stair training;Moist Heat;Aquatic Therapy;Electrical Stimulation;Cryotherapy;Manual techniques     PT Next Visit Plan  Attempt more dynamic balance tasks, ambulation over obstacles, ambulation with head turns, stepping response tasks    PT Home Exercise Plan  See education    Consulted and Agree with Plan of Care  Patient       Patient will benefit from skilled therapeutic intervention in order to improve the following deficits and impairments:  Abnormal gait, Decreased coordination, Decreased endurance, Decreased activity tolerance, Decreased strength, Decreased range of motion, Decreased balance, Difficulty walking  Visit Diagnosis: 1. Difficulty in  walking, not elsewhere classified   2. Muscle weakness (generalized)        Problem List Patient Active Problem List   Diagnosis Date Noted  . Atrial fibrillation ([facility]) 04/26/2017  . Anemia 04/26/2017  . 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 Macular degeneration of left eye 01/06/2015  . Essential hypertension 12/25/2013  . BACK PAIN 08/02/2010    Myrene GalasWesley Ankita Newcomer, PT DPT 02/25/2019, 5:06 PM  [facility] [address] [place], [place], [address] Phone: [phone]   Fax:  [phone]  Name: Paula Luna MRN: [id number] Date of Birth: Apr 17, 1925

## 2019-02-25 NOTE — Telephone Encounter (Signed)
Call  I would first increase stool softeners to 2 po BID  Don't do anything else for a while.  If still an issue, add Miralax once a day.    Very slowly more miralax could be added, but do slowly or loose stools could come just from meds.

## 2019-03-04 ENCOUNTER — Other Ambulatory Visit: Payer: Self-pay

## 2019-03-04 ENCOUNTER — Ambulatory Visit: Payer: PPO

## 2019-03-04 DIAGNOSIS — M6281 Muscle weakness (generalized): Secondary | ICD-10-CM

## 2019-03-04 DIAGNOSIS — R262 Difficulty in walking, not elsewhere classified: Secondary | ICD-10-CM

## 2019-03-04 NOTE — Therapy (Signed)
[facility] [address] [place], [place], [address] Phone: [phone]   Fax:  [phone]  Physical Therapy Treatment  Patient Details  Name: Paula Luna MRN: [id number] Date of Birth: December 02, 1924 No data recorded  Encounter Date: 03/04/2019  PT End of Session - 03/04/19 1610    Visit Number  53    Number of Visits  60    Date for PT Re-Evaluation  03/19/19    Authorization Type  --    Authorization Time Period  --    PT Start Time  1600    PT Stop Time  1645    PT Time Calculation (min)  45 min    Equipment Utilized During Treatment  Gait belt    Activity Tolerance  Patient tolerated treatment well    Behavior During Therapy  WFL for tasks assessed/performed       Past Medical History:  Diagnosis Date  . Anemia   . Back pain   . Hypertension 12/25/2013  . Ischemic bowel disease (HCC)    a. 03/2017 abd pain-->internal herina and ischemic jejunum s/p ex lap and small bowel resection.  . Macular degeneration of left eye 01/06/2015  . Osteoporosis   . Persistent atrial fibrillation    a. First noted 03/2017;  b. 07/2017 Echo: EF 60-65%, no rwma, mild MR, mildly to mod dil LA; c. 07/2017 48hr Holter: persistent Afib, avg HR of 66 (range 39-140 bpm);  d. CHA2DS2VASc = 4-->Xarelto initiated 07/2017.    Past Surgical History:  Procedure Laterality Date  . CLOSED REDUCTION PROXIMAL HUMERUS FRACTURE Left 2017  . LAPAROSCOPIC SMALL BOWEL RESECTION    . LAPAROTOMY N/A 03/18/2017   Procedure: EXPLORATORY LAPAROTOMY, SMALL BOWEL RESECTION;  Surgeon: Leafy RoPabon, Diego F, MD;  Location: [facility] ORS;  Service: General;  Laterality: N/A;  . TOTAL ABDOMINAL HYSTERECTOMY      There were no vitals filed for this visit.  Subjective Assessment - 03/04/19 1605    Subjective  Patient states she has difficulty walking and exercising while wearing her mask. Patient states she's been walking at home.    Pertinent History  Small bowel  surgery     Limitations  Standing;Walking    Patient Stated Goals  To walk without feeling of falling     Currently in Pain?  No/denies       TREATMENT Therapeutic Exercise Nustep to improve strength and endurance in the UE/LE level 5 - x603min; level 4 x933min Monster walks in standing with UE support with RTB around knees - x 8 down and back (around 4') Hip abduction in standing with UE support - 2 x 10 Hip extension in standing with UE support - 2 x 10 Squats in standing with UE support - x 10 with RTB Step ups on airex pad - x 5  Side stepping across airex pad with UE support - x 10 B Walking with focus on improving foot clearance in standing - 100 ft    Performed exercises with airex pad to address balance concerns. Patient demonstrates difficulty with performing standing exercises with the airex pad secondary to difficulty with static balance.   PT Education - 03/04/19 1610    Education provided  Yes    Education Details  form/technique with exercise    Person(s) Educated  Patient    Methods  Explanation;Demonstration    Comprehension  Verbalized understanding;Returned demonstration          PT Long Term Goals -  02/05/19 1651      PT LONG TERM GOAL #1   Title  Patient will be independent with HEP focused on decreasing fall risk to continue benefits of therapy after discharge.     Baseline  Dependent with exercise performance; 01/01/2018: moderate cueing for performance. 03/28/18 min-mod cueing required; 04/30/18: min cueing required; 09/24/2018: min cueing required; 11/12/2018: Patient performs exercise with little to no cueing.     Time  6    Period  Weeks    Status  Achieved      PT LONG TERM GOAL #2   Title  Patient will improve TUG to under 12 sec to indicate singificant decrease in fall risk.     Baseline  20sec; 01/01/2018: 14sec; 02/12/2018; 11.59 sec;    Time  8    Period  Weeks    Status  Achieved      PT LONG TERM GOAL #3   Title  Patient will improve her ability  to perform 10mWT to 4817m/s to indicate increased safety with walking at community ambulation    Baseline  .45417m/s; 01/01/2018: .7564m/s; 02/12/2018: .2671m/s; 03/28/18 .94 m/s; 8/19/9: testing of goals deferred to next session; 06/28/2018: .5076m/s; 09/24/2018: deferred; 10/08/2018: .85 m/s; 11/12/2018:  .89 m/s; 02/05/2019: .5171m/s    Time  8    Period  Weeks    Status  On-going      PT LONG TERM GOAL #4   Title  Patient will improve 5XSTS to under 16 sec to indicate significant improvement LE functional strength and decrease fall risk.    Baseline  30sec; 01/01/2018: 12 sec    Time  8    Period  Weeks    Status  Achieved      PT LONG TERM GOAL #5   Title  Pt will ambulate 5600ft in 3 minutes to demonstrate improved endurance with walking into department stores from the parking lot.     Baseline  03/28/18: 38350ft; 04/30/18: testing of goals deferred to next session; 06/28/2018: 21950ft; 09/24/2018: deferred; 10/08/2018: 26100ft; 11/12/2018: 52850ft with rollator; 02/04/2019: 54450ft    Time  6    Period  Weeks    Status  On-going            Plan - 03/04/19 1628    Clinical Impression Statement  Patient demonstrates improvement with hip exercises with ability to perform exercises with RTB compared to previous sessions without a band indicating functional improvement and carryover between sessions. Patient is self limiting based off of increased fatigue from wearing the mask. Patient reports early onset of fatigue with exercises and will benefit from further skilled therapy to return to prior level of function.    Rehab Potential  Fair    Clinical Impairments Affecting Rehab Potential  (+) family support, decreased coormorbities (-) age    PT Frequency  1x / week    PT Duration  6 weeks    PT Treatment/Interventions  Balance training;Neuromuscular re-education;Gait training;Patient/family education;Therapeutic exercise;Therapeutic activities;Functional mobility training;Stair training;Moist Heat;Aquatic Therapy;Electrical  Stimulation;Cryotherapy;Manual techniques    PT Next Visit Plan  Attempt more dynamic balance tasks, ambulation over obstacles, ambulation with head turns, stepping response tasks    PT Home Exercise Plan  See education    Consulted and Agree with Plan of Care  Patient       Patient will benefit from skilled therapeutic intervention in order to improve the following deficits and impairments:  Abnormal gait, Decreased coordination, Decreased endurance, Decreased activity tolerance, Decreased strength, Decreased range  of motion, Decreased balance, Difficulty walking  Visit Diagnosis: 1. Difficulty in walking, not elsewhere classified   2. Muscle weakness (generalized)        Problem List Patient Active Problem List   Diagnosis Date Noted  . Atrial fibrillation ([place]) 04/26/2017  . Anemia 04/26/2017  . Ischemic bowel disease ([place]) 03/18/2017  . 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 Macular degeneration of left eye 01/06/2015  . Essential hypertension 12/25/2013  . BACK PAIN 08/02/2010    Blythe Stanford, PT DPT 03/04/2019, 5:29 PM  [place] [facility] [address] [place], [address] Phone: [phone]   Fax:  [phone]  Name: Paula Luna MRN: [id number] Date of Birth: Jun 06, 1925

## 2019-03-11 ENCOUNTER — Ambulatory Visit: Payer: PPO

## 2019-03-18 ENCOUNTER — Other Ambulatory Visit: Payer: Self-pay

## 2019-03-18 ENCOUNTER — Ambulatory Visit: Payer: PPO | Attending: Family Medicine

## 2019-03-18 DIAGNOSIS — M6281 Muscle weakness (generalized): Secondary | ICD-10-CM

## 2019-03-18 DIAGNOSIS — R262 Difficulty in walking, not elsewhere classified: Secondary | ICD-10-CM

## 2019-03-18 NOTE — Therapy (Signed)
[facility] [address], [place], [address] Phone: [phone]   Fax:  [phone]  Physical Therapy Treatment  Patient Details  Name: Paula Luna MRN: [id number] Date of Birth: [birth year] No data recorded  Encounter Date: 03/18/2019  PT End of Session - 03/18/19 1642    Visit Number  21    Number of Visits  60    Date for PT Re-Evaluation  03/19/19    PT Start Time  1600    PT Stop Time  1645    PT Time Calculation (min)  45 min    Equipment Utilized During Treatment  Gait belt    Activity Tolerance  Patient tolerated treatment well    Behavior During Therapy  [facility] for tasks assessed/performed       Past Medical History:  Diagnosis Date  . Anemia   . Back pain   . Hypertension 12/25/2013  . Ischemic bowel disease ([place])    a. 03/2017 abd pain-->internal herina and ischemic jejunum s/p ex lap and small bowel resection.  . Macular degeneration of left eye 01/06/2015  . Osteoporosis   . Persistent atrial fibrillation    a. First noted 03/2017;  b. 07/2017 Echo: EF 60-65%, no rwma, mild MR, mildly to mod dil LA; c. 07/2017 48hr Holter: persistent Afib, avg HR of 66 (range 39-140 bpm);  d. CHA2DS2VASc = 4-->Xarelto initiated 07/2017.    Past Surgical History:  Procedure Laterality Date  . CLOSED REDUCTION PROXIMAL HUMERUS FRACTURE Left 2017  . LAPAROSCOPIC SMALL BOWEL RESECTION    . LAPAROTOMY N/A 03/18/2017   Procedure: EXPLORATORY LAPAROTOMY, SMALL BOWEL RESECTION;  Surgeon: Jules Husbands, MD;  Location: [facility] ORS;  Service: General;  Laterality: N/A;  . TOTAL ABDOMINAL HYSTERECTOMY      There were no vitals filed for this visit.  Subjective Assessment - 03/18/19 1632    Subjective  Patient states no significant changes since the previous session. Patient states she didn't feel well last week and had to miss per appointment.    Pertinent History  Small bowel surgery     Limitations   Standing;Walking    Patient Stated Goals  To walk without feeling of falling     Currently in Pain?  No/denies       TREATMENT  BP: 156/54mmHg  Therapeutic Exercises Nustep in sitting with focusing on improving speed - 6 min;  level 5 Hip extension in standing - 2 x 10  Hip abduction in standing - 2 x 10  Farmers carry with 1# db in each hand - 2x 66ft B Squats with UE support - 2 x 10  Performed weighted exercises today to better strengthen her LE and improve her ability to walk for prolonged periods of time compared to previous sessions.    PT Education - 03/18/19 1642    Education provided  Yes    Education Details  form/technique with exercise    Person(s) Educated  Patient    Methods  Explanation;Demonstration    Comprehension  Verbalized understanding;Returned demonstration          PT Long Term Goals - 02/05/19 1651      PT LONG TERM GOAL #1   Title  Patient will be independent with HEP focused on decreasing fall risk to continue benefits of therapy after discharge.     Baseline  Dependent with exercise performance; 01/01/2018: moderate cueing for performance. 03/28/18 min-mod cueing required; 04/30/18: min cueing required; 09/24/2018:  min cueing required; 11/12/2018: Patient performs exercise with little to no cueing.     Time  6    Period  Weeks    Status  Achieved      PT LONG TERM GOAL #2   Title  Patient will improve TUG to under 12 sec to indicate singificant decrease in fall risk.     Baseline  20sec; 01/01/2018: 14sec; 02/12/2018; 11.59 sec;    Time  8    Period  Weeks    Status  Achieved      PT LONG TERM GOAL #3   Title  Patient will improve her ability to perform 10mWT to 6260m/s to indicate increased safety with walking at community ambulation    Baseline  .31460m/s; 01/01/2018: .6941m/s; 02/12/2018: .862m/s; 03/28/18 .94 m/s; 8/19/9: testing of goals deferred to next session; 06/28/2018: .2173m/s; 09/24/2018: deferred; 10/08/2018: .85 m/s; 11/12/2018:  .89 m/s; 02/05/2019:  .762m/s    Time  8    Period  Weeks    Status  On-going      PT LONG TERM GOAL #4   Title  Patient will improve 5XSTS to under 16 sec to indicate significant improvement LE functional strength and decrease fall risk.    Baseline  30sec; 01/01/2018: 12 sec    Time  8    Period  Weeks    Status  Achieved      PT LONG TERM GOAL #5   Title  Pt will ambulate 58900ft in 3 minutes to demonstrate improved endurance with walking into department stores from the parking lot.     Baseline  03/28/18: 34750ft; 04/30/18: testing of goals deferred to next session; 06/28/2018: 26550ft; 09/24/2018: deferred; 10/08/2018: 23600ft; 11/12/2018: 54150ft with rollator; 02/04/2019: 54050ft    Time  6    Period  Weeks    Status  On-going            Plan - 03/18/19 1643    Clinical Impression Statement  Patient demonstrates improvement in walking ability and was able to perform under weight which she has not been able to in the past. Although patient is making improvements, she continues to have difficulty with walking for prolonged periods of time without the use of a walker. This is most likely from LE weakness and decreased confidence with balance. Patient will benefit from further skilled therapy focused on improving limitations to return to prior level of function.    Rehab Potential  Fair    Clinical Impairments Affecting Rehab Potential  (+) family support, decreased coormorbities (-) age    PT Frequency  1x / week    PT Duration  6 weeks    PT Treatment/Interventions  Balance training;Neuromuscular re-education;Gait training;Patient/family education;Therapeutic exercise;Therapeutic activities;Functional mobility training;Stair training;Moist Heat;Aquatic Therapy;Electrical Stimulation;Cryotherapy;Manual techniques    PT Next Visit Plan  Attempt more dynamic balance tasks, ambulation over obstacles, ambulation with head turns, stepping response tasks    PT Home Exercise Plan  See education    Consulted and Agree with Plan  of Care  Patient       Patient will benefit from skilled therapeutic intervention in order to improve the following deficits and impairments:  Abnormal gait, Decreased coordination, Decreased endurance, Decreased activity tolerance, Decreased strength, Decreased range of motion, Decreased balance, Difficulty walking  Visit Diagnosis: 1. Difficulty in walking, not elsewhere classified   2. Muscle weakness (generalized)        Problem List Patient Active Problem List   Diagnosis Date Noted  . Atrial fibrillation (  HCC) 04/26/2017  . Anemia 04/26/2017  . 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 Macular degeneration of left eye 01/06/2015  . Essential hypertension 12/25/2013  . BACK PAIN 08/02/2010    Myrene GalasWesley Adaliah Hiegel 03/18/2019, 4:46 PM  [facility] [address] [place], [place], [address] Phone: [phone]   Fax:  [phone]  Name: Paula Luna MRN: [id number] Date of Birth: [birth year]

## 2019-03-25 ENCOUNTER — Other Ambulatory Visit: Payer: Self-pay

## 2019-03-25 ENCOUNTER — Ambulatory Visit (INDEPENDENT_AMBULATORY_CARE_PROVIDER_SITE_OTHER): Payer: PPO | Admitting: Family Medicine

## 2019-03-25 ENCOUNTER — Encounter: Payer: Self-pay | Admitting: Family Medicine

## 2019-03-25 ENCOUNTER — Ambulatory Visit: Payer: PPO

## 2019-03-25 VITALS — BP 130/83 | HR 82 | Temp 98.6°F | Ht 58.6 in | Wt 125.2 lb

## 2019-03-25 DIAGNOSIS — R262 Difficulty in walking, not elsewhere classified: Secondary | ICD-10-CM | POA: Diagnosis not present

## 2019-03-25 DIAGNOSIS — Z0000 Encounter for general adult medical examination without abnormal findings: Secondary | ICD-10-CM

## 2019-03-25 DIAGNOSIS — M6281 Muscle weakness (generalized): Secondary | ICD-10-CM

## 2019-03-25 NOTE — Progress Notes (Signed)
Daren Doswell T. Javoris Star, MD Primary Care and Sports Medicine [facility] [address] [place], [address] Phone: [phone]  FAX: [phone]  Paula Luna - 85 y.o. female  MRN [id number]  Date of Birth: [birth year]  Visit Date: 03/25/2019  PCP: Hannah Beatopland, Alexandar Weisenberger, MD  Referred by: Hannah Beatopland, Rafaella Kole, MD  Chief Complaint  Patient presents with  . Annual Exam   Patient Care Team: Hannah Beatopland, Grettel Rames, MD as PCP - General End, Cristal Deerhristopher, MD as PCP - Cardiology (Cardiology) Subjective:   Paula CornsRoxanne D Luna is a 85 y.o. pleasant patient who presents with the following:  Health Maintenance Summary Reviewed and updated, unless pt declines services.  Tobacco History Reviewed. Non-smoker Alcohol: No concerns, no excessive use Exercise Habits: Some activity, rec at least 30 mins 5 times a week - therapy once a week now. Use the walker down the road. Will turn around and go back.  When it is hot like this, not as much like that.  Then up the staris to bed.  STD concerns: none, n/a Drug Use: None Birth control method: n/a Menses regular: no Lumps or breast concerns: no Breast Cancer Family History: no  Constipation occaisionally     Health Maintenance  Topic Date Due  . INFLUENZA VACCINE  04/13/2019  . DEXA SCAN  Completed  . PNA vac Low Risk Adult  Completed    Immunization History  Administered Date(s) Administered  . Influenza Split 07/12/2012, 06/28/2013  . Influenza Whole 08/20/2007, 05/10/2010  . Influenza, High Dose Seasonal PF 08/14/2014, 07/29/2015, 05/22/2016, 06/10/2017, 06/20/2018  . Pneumococcal Conjugate-13 12/25/2013  . Pneumococcal Polysaccharide-23 05/10/2010  . Zoster 10/15/2012  . Zoster Recombinat (Shingrix) 03/06/2018   Patient Active Problem List   Diagnosis Date Noted  . Atrial fibrillation (HCC) 04/26/2017  . Anemia 04/26/2017  . 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 Ischemic necrosis of small bowel  (HCC)   . Macular degeneration of left eye 01/06/2015  . Essential hypertension 12/25/2013  . BACK PAIN 08/02/2010   Past Medical History:  Diagnosis Date  . Anemia   . Back pain   . Hypertension 12/25/2013  . Ischemic bowel disease (HCC)    a. 03/2017 abd pain-->internal herina and ischemic jejunum s/p ex lap and small bowel resection.  . Macular degeneration of left eye 01/06/2015  . Osteoporosis   . Persistent atrial fibrillation    a. First noted 03/2017;  b. 07/2017 Echo: EF 60-65%, no rwma, mild MR, mildly to mod dil LA; c. 07/2017 48hr Holter: persistent Afib, avg HR of 66 (range 39-140 bpm);  d. CHA2DS2VASc = 4-->Xarelto initiated 07/2017.   Past Surgical History:  Procedure Laterality Date  . CLOSED REDUCTION PROXIMAL HUMERUS FRACTURE Left 2017  . LAPAROSCOPIC SMALL BOWEL RESECTION    . LAPAROTOMY N/A 03/18/2017   Procedure: EXPLORATORY LAPAROTOMY, SMALL BOWEL RESECTION;  Surgeon: Leafy RoPabon, Diego F, MD;  Location: [facility] ORS;  Service: General;  Laterality: N/A;  . TOTAL ABDOMINAL HYSTERECTOMY     Social History   Socioeconomic History  . Marital status: Widowed    Spouse name: Not on file  . Number of children: Not on file  . Years of education: Not on file  . Highest education level: Not on file  Occupational History  . Occupation: MusicianGreeter    Employer: [email or web address]    Comment: 3 days a week  Social Needs  . Financial resource strain: Not on file  . Food insecurity    Worry: Not  on file    Inability: Not on file  . Transportation needs    Medical: Not on file    Non-medical: Not on file  Tobacco Use  . Smoking status: Former Games developermoker  . Smokeless tobacco: Never Used  Substance and Sexual Activity  . Alcohol use: No  . Drug use: No  . Sexual activity: Not Currently  Lifestyle  . Physical activity    Days per week: Not on file    Minutes per session: Not on file  . Stress: Not on file  Relationships  . Social Musicianconnections    Talks on phone: Not on file    Gets  together: Not on file    Attends religious service: Not on file    Active member of club or organization: Not on file    Attends meetings of clubs or organizations: Not on file    Relationship status: Not on file  . Intimate partner violence    Fear of current or ex partner: Not on file    Emotionally abused: Not on file    Physically abused: Not on file    Forced sexual activity: Not on file  Other Topics Concern  . Not on file  Social History Narrative   Widowed after 64 years   Working some at [employer]   Active in [employer]   Lives with daughter   Family History  Problem Relation Age of Onset  . Diabetes Mother   . Cancer Father        jaw   Allergies  Allergen Reactions  . Latex Rash  . Tetanus Toxoid Anaphylaxis    REACTION: anaphylaxis  . Alendronate Sodium     REACTION: passed out    Medication list has been reviewed and updated.   General: Denies fever, chills, sweats. No significant weight loss. Eyes: Denies blurring,significant itching ENT: Denies earache, sore throat, and hoarseness.  Cardiovascular: Denies chest pains, palpitations, dyspnea on exertion,  Respiratory: Denies cough, dyspnea at rest,wheeezing Breast: no concerns about lumps GI: Denies nausea, vomiting, diarrhea, constipation, change in bowel habits, abdominal pain, melena, hematochezia GU: Denies dysuria, hematuria, urinary hesitancy, nocturia, denies STD risk, no concerns about discharge Musculoskeletal: Denies back pain, joint pain Derm: Denies rash, itching Neuro: Denies  paresthesias, frequent falls, frequent headaches Psych: Denies depression, anxiety Endocrine: Denies cold intolerance, heat intolerance, polydipsia Heme: Denies enlarged lymph nodes Allergy: No hayfever  Objective:   BP 130/83   Pulse 82   Temp 98.6 F (37 C) (Skin)   Ht 4' 10.6" (1.488 m)   Wt 125 lb 4 oz (56.8 kg)   SpO2 97%   BMI 25.64 kg/m  Ideal Body Weight: Weight in (lb) to have BMI = 25: 121.8  No exam data present [facility] screen [facility] 02/18/2019 02/13/2018 01/23/2017 01/20/2016 01/05/2015  Decreased Interest 0 0 0 0 0  Down, Depressed, Hopeless 0 0 0 0 0  PHQ - 2 Score 0 0 0 0 0  Altered sleeping 0 0 - - -  Tired, decreased energy 0 0 - - -  Change in appetite 0 0 - - -  Feeling bad or failure about yourself  0 0 - - -  Trouble concentrating 0 0 - - -  Moving slowly or fidgety/restless 0 0 - - -  Suicidal thoughts 0 0 - - -  PHQ-9 Score 0 0 - - -  Difficult doing work/chores Not difficult at all Not difficult at all - - -   GEN:  well developed, well nourished, no acute distress Eyes: conjunctiva and lids normal, PERRLA, EOMI ENT: TM clear, nares clear, oral exam WNL Neck: supple, no lymphadenopathy, no thyromegaly, no JVD Pulm: clear to auscultation and percussion, respiratory effort normal CV: regular rate and rhythm, S1-S2, no murmur, rub or gallop, no bruits Chest: no scars, masses, no lumps BREAST: breast exam declined GI: soft, non-tender; no hepatosplenomegaly, masses; active bowel sounds all quadrants GU: GU exam declined Lymph: no cervical, axillary or inguinal adenopathy MSK: gait normal, muscle tone and strength WNL, no joint swelling, effusions, discoloration, crepitus  SKIN: clear, good turgor, color WNL, no rashes, lesions, or ulcerations Neuro: normal mental status, normal strength, sensation, and motion Psych: alert; oriented to person, place and time, normally interactive and not anxious or depressed in appearance.  All labs reviewed with patient. Results for orders placed or performed in visit on 02/18/19  Hemoglobin A1c  Result Value Ref Range   Hgb A1c MFr Bld 5.8 4.6 - 6.5 %  Lipid panel  Result Value Ref Range   Cholesterol 169 0 - 200 mg/dL   Triglycerides 14.785.0 0.0 - 149.0 mg/dL   HDL 82.9561.30 >62.13>39.00 mg/dL   VLDL 08.617.0 0.0 - 57.840.0 mg/dL   LDL Cholesterol 91 0 - 99 mg/dL   Total CHOL/HDL Ratio 3    NonHDL 107.66   VITAMIN D 25 Hydroxy (Vit-D  Deficiency, Fractures)  Result Value Ref Range   VITD 41.08 30.00 - 100.00 ng/mL  CBC with Differential/Platelet  Result Value Ref Range   WBC 5.7 4.0 - 10.5 K/uL   RBC 5.02 3.87 - 5.11 Mil/uL   Hemoglobin 12.9 12.0 - 15.0 g/dL   HCT 46.939.5 62.936.0 - 52.846.0 %   MCV 78.7 78.0 - 100.0 fl   MCHC 32.7 30.0 - 36.0 g/dL   RDW 41.316.2 (H) 24.411.5 - 01.015.5 %   Platelets 180.0 150.0 - 400.0 K/uL   Neutrophils Relative % 57.3 43.0 - 77.0 %   Lymphocytes Relative 32.2 12.0 - 46.0 %   Monocytes Relative 8.0 3.0 - 12.0 %   Eosinophils Relative 2.2 0.0 - 5.0 %   Basophils Relative 0.3 0.0 - 3.0 %   Neutro Abs 3.3 1.4 - 7.7 K/uL   Lymphs Abs 1.8 0.7 - 4.0 K/uL   Monocytes Absolute 0.5 0.1 - 1.0 K/uL   Eosinophils Absolute 0.1 0.0 - 0.7 K/uL   Basophils Absolute 0.0 0.0 - 0.1 K/uL  Basic metabolic panel  Result Value Ref Range   Sodium 142 135 - 145 mEq/L   Potassium 3.7 3.5 - 5.1 mEq/L   Chloride 106 96 - 112 mEq/L   CO2 28 19 - 32 mEq/L   Glucose, Bld 106 (H) 70 - 99 mg/dL   BUN 15 6 - 23 mg/dL   Creatinine, Ser 2.720.77 0.40 - 1.20 mg/dL   Calcium 9.2 8.4 - 53.610.5 mg/dL   GFR 64.4070.05 >34.74>60.00 mL/min   No results found.  Assessment and Plan:     ICD-10-CM   1. Healthcare maintenance  Z00.00    Doing great at 85 years old  Health Maintenance Exam: The patient's preventative maintenance and recommended screening tests for an annual wellness exam were reviewed in full today. Brought up to date unless services declined.  Counselled on the importance of diet, exercise, and its role in overall health and mortality. The patient's FH and SH was reviewed, including their home life, tobacco status, and drug and alcohol status.  Follow-up in 1 year for physical exam  or additional follow-up below.  Follow-up: No follow-ups on file. Or follow-up in 1 year if not noted.  Future Appointments  Date Time Provider [place]  03/25/2019  4:15 PM Blythe Stanford, PT [facility] None  04/01/2019  4:15 PM Rissell,  Lake Bells, PT [facility] None  04/08/2019  4:00 PM Rissell, [place] Bells, PT [facility] None  03/18/2020  9:30 AM [facility]  03/25/2020 11:20 AM Omarie Parcell, Frederico Hamman, MD [facility]    No orders of the defined types were placed in this encounter.  There are no discontinued medications. No orders of the defined types were placed in this encounter.   Signed,  Maud Deed. Lunell Robart, MD   Allergies as of 03/25/2019      Reactions   Latex Rash   Tetanus Toxoid Anaphylaxis   REACTION: anaphylaxis   Alendronate Sodium    REACTION: passed out      Medication List       Accurate as of March 25, 2019  1:48 PM. If you have any questions, ask your nurse or doctor.        docusate sodium 100 MG capsule Commonly known as: COLACE Take 200 mg by mouth daily.   fluticasone 50 MCG/ACT nasal spray Commonly known as: FLONASE Place 2 sprays into both nostrils daily.   ICaps Caps Take 1 capsule by mouth 2 (two) times daily.   lisinopril 20 MG tablet Commonly known as: ZESTRIL Take 1 tablet (20 mg total) by mouth daily.   metoprolol succinate 25 MG 24 hr tablet Commonly known as: TOPROL-XL Take 1 tablet by mouth once daily   NON FORMULARY Vitamin D 50 mcg, take one daily by mouth   rivaroxaban 20 MG Tabs tablet Commonly known as: Xarelto Take 1 tablet (20 mg total) by mouth daily with supper.

## 2019-03-25 NOTE — Therapy (Signed)
[facility] PH[facility]IC[facility] [address] [place], [place], [address] Phone: [phone]   Fax:  [phone]  Physical Therapy Treatment  Patient Details  Name: Paula Luna MRN: [id number] Date of Birth: [birth year] No data recorded  Encounter Date: 03/25/2019  PT End of Session - 03/25/19 1627    Visit Number  55    Number of Visits  60    Date for PT Re-Evaluation  03/19/19    PT Start Time  1615    PT Stop Time  1700    PT Time Calculation (min)  45 min    Equipment Utilized During Treatment  Gait belt    Activity Tolerance  Patient tolerated treatment well    Behavior During Therapy  WFL for tasks assessed/performed       Past Medical History:  Diagnosis Date  . Anemia   . Back pain   . Hypertension 12/25/2013  . Ischemic bowel disease (HCC)    a. 03/2017 abd pain-->internal herina and ischemic jejunum s/p ex lap and small bowel resection.  . Macular degeneration of left eye 01/06/2015  . Osteoporosis   . Persistent atrial fibrillation    a. First noted 03/2017;  b. 07/2017 Echo: EF 60-65%, no rwma, mild MR, mildly to mod dil LA; c. 07/2017 48hr Holter: persistent Afib, avg HR of 66 (range 39-140 bpm);  d. CHA2DS2VASc = 4-->Xarelto initiated 07/2017.    Past Surgical History:  Procedure Laterality Date  . CLOSED REDUCTION PROXIMAL HUMERUS FRACTURE Left 2017  . LAPAROSCOPIC SMALL BOWEL RESECTION    . LAPAROTOMY N/A 03/18/2017   Procedure: EXPLORATORY LAPAROTOMY, SMALL BOWEL RESECTION;  Surgeon: Leafy RoPabon, Diego F, MD;  Location: [facility];  Service: General;  Laterality: N/A;  . TOTAL ABDOMINAL HYSTERECTOMY      There were no vitals filed for this visit.  Subjective Assessment - 03/25/19 1625    Subjective  Patient report no significant changes since the previous session. Patient states she went to see her PCP with no significant changes.    Pertinent History  Small bowel surgery     Limitations  Standing;Walking    Patient Stated Goals  To walk without feeling of falling     Currently in Pain?  No/denies          TREATMENT Therapeutic Exercise Nustep in sitting -- x 6 min level 5 with seat level 5 Ambulation with focus on improving speed of exercise with rollator amb 87100ft Ambulation without use of rollator with focus on speed -- 4833ft x 6 Ambulation with focus on perform ambulation with performing greater stride length and arm swing with exercise -- x 20   Patient demonstrates improvement with arm swing and gait speed after verbal cueing PT Education - 03/25/19 1626    Education provided  Yes    Education Details  form/technique with exercise    Person(s) Educated  Patient    Methods  Explanation;Demonstration    Comprehension  Verbalized understanding;Returned demonstration          PT Long Term Goals - 03/25/19 1639      PT LONG TERM GOAL #1   Title  Patient will be independent with HEP focused on decreasing fall risk to continue benefits of therapy after discharge.     Baseline  Dependent with exercise performance; 01/01/2018: moderate cueing for performance. 03/28/18 min-mod cueing required; 04/30/18: min cueing required; 09/24/2018: min cueing required; 11/12/2018: Patient performs exercise with little to no cueing.  Time  6    Period  Weeks    Status  Achieved      PT LONG TERM GOAL #2   Title  Patient will improve TUG to under 12 sec to indicate singificant decrease in fall risk.     Baseline  20sec; 01/01/2018: 14sec; 02/12/2018; 11.59 sec;    Time  8    Period  Weeks    Status  Achieved      PT LONG TERM GOAL #3   Title  Patient will improve her ability to perform 15mWT to 3m/s to indicate increased safety with walking at community ambulation    Baseline  .37m/s; 01/01/2018: .83m/s; 02/12/2018: .26m/s; 03/28/18 .94 m/s; 8/19/9: testing of goals deferred to next session; 06/28/2018: .89m/s; 09/24/2018: deferred; 10/08/2018: .85 m/s; 11/12/2018:  .89 m/s; 02/05/2019: .60m/s; 03/25/2019:  .63m/s    Time  8    Period  Weeks    Status  On-going      PT LONG TERM GOAL #4   Title  Patient will improve 5XSTS to under 16 sec to indicate significant improvement LE functional strength and decrease fall risk.    Baseline  30sec; 01/01/2018: 12 sec    Time  8    Period  Weeks    Status  Achieved      PT LONG TERM GOAL #5   Title  Pt will ambulate 522ft in 3 minutes to demonstrate improved endurance with walking into department stores from the parking lot.     Baseline  03/28/18: 360ft; 04/30/18: testing of goals deferred to next session; 06/28/2018: 280ft; 09/24/2018: deferred; 10/08/2018: 275ft; 11/12/2018: 576ft with rollator; 02/04/2019: 523ft    Time  6    Period  Weeks    Status  Achieved      Additional Long Term Goals   Additional Long Term Goals  Yes      PT LONG TERM GOAL #6   Title  Patient will be able to perform >1044ft in a 48minWT with a rollator to decrease fall risk with ambulating at community distances.    Baseline  823ft with rollator    Time  6    Period  Weeks    Status  New    Target Date  05/06/19            Plan - 03/25/19 1724    Clinical Impression Statement  Patient is making progress towards long term goals with significant improvement with performing 86minWT and 50mwt compared to previous sesions indicating functional carryover between sessions and improvement in gait. Patient is improving, however she continues to have limitations with performing walking without an AD which she doesn't use at home. Patient continues to ambulate with forward flexion and keeps her hand on her hip when walking. Patient will benefit from furhter skilled therapy to return to prior level of function.    Rehab Potential  Fair    Clinical Impairments Affecting Rehab Potential  (+) family support, decreased coormorbities (-) age    PT Frequency  1x / week    PT Duration  6 weeks    PT Treatment/Interventions  Balance training;Neuromuscular re-education;Gait  training;Patient/family education;Therapeutic exercise;Therapeutic activities;Functional mobility training;Stair training;Moist Heat;Aquatic Therapy;Electrical Stimulation;Cryotherapy;Manual techniques    PT Next Visit Plan  Attempt more dynamic balance tasks, ambulation over obstacles, ambulation with head turns, stepping response tasks    PT Home Exercise Plan  See education    Consulted and Agree with Plan of Care  Patient  Patient will benefit from skilled therapeutic intervention in order to improve the following deficits and impairments:  Abnormal gait, Decreased coordination, Decreased endurance, Decreased activity tolerance, Decreased strength, Decreased range of motion, Decreased balance, Difficulty walking  Visit Diagnosis: 1. Difficulty in walking, not elsewhere classified   2. Muscle weakness (generalized)        Problem List Patient Active Problem List   Diagnosis Date Noted  . Atrial fibrillation (HCC) 04/26/2017  . Anemia 04/26/2017  . 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 Macular degeneration of left eye 01/06/2015  . Essential hypertension 12/25/2013  . BACK PAIN 08/02/2010    Myrene GalasWesley Xue Low, PT DPT 03/25/2019, 5:32 PM  [facility] [address] [place], [place], [address] Phone: [phone]   Fax:  [phone]  Name: Paula Luna MRN: [id number] Date of Birth: Jul 18, 1925

## 2019-03-26 NOTE — Addendum Note (Signed)
Addended by: Blain Pais on: 03/26/2019 10:47 AM   Modules accepted: Orders

## 2019-04-01 ENCOUNTER — Other Ambulatory Visit: Payer: Self-pay

## 2019-04-01 ENCOUNTER — Ambulatory Visit: Payer: PPO

## 2019-04-01 DIAGNOSIS — R262 Difficulty in walking, not elsewhere classified: Secondary | ICD-10-CM

## 2019-04-01 DIAGNOSIS — M6281 Muscle weakness (generalized): Secondary | ICD-10-CM

## 2019-04-01 NOTE — Therapy (Signed)
[facility] [address] [place], [place], [address] Phone: [phone]   Fax:  [phone]  Physical Therapy Treatment  Patient Details  Name: Paula Luna MRN: [id number] Date of Birth: 08-Oct-1924 No data recorded  Encounter Date: 04/01/2019  PT End of Session - 04/01/19 1724    Visit Number  56    Number of Visits  60    Date for PT Re-Evaluation  05/06/19    PT Start Time  1615    PT Stop Time  1700    PT Time Calculation (min)  45 min    Equipment Utilized During Treatment  Gait belt    Activity Tolerance  Patient tolerated treatment well    Behavior During Therapy  [facility] for tasks assessed/performed       Past Medical History:  Diagnosis Date  . Anemia   . Back pain   . Hypertension 12/25/2013  . Ischemic bowel disease (HCC)    a. 03/2017 abd pain-->internal herina and ischemic jejunum s/p ex lap and small bowel resection.  . Macular degeneration of left eye 01/06/2015  . Osteoporosis   . Persistent atrial fibrillation    a. First noted 03/2017;  b. 07/2017 Echo: EF 60-65%, no rwma, mild MR, mildly to mod dil LA; c. 07/2017 48hr Holter: persistent Afib, avg HR of 66 (range 39-140 bpm);  d. CHA2DS2VASc = 4-->Xarelto initiated 07/2017.    Past Surgical History:  Procedure Laterality Date  . CLOSED REDUCTION PROXIMAL HUMERUS FRACTURE Left 2017  . LAPAROSCOPIC SMALL BOWEL RESECTION    . LAPAROTOMY N/A 03/18/2017   Procedure: EXPLORATORY LAPAROTOMY, SMALL BOWEL RESECTION;  Surgeon: Leafy RoPabon, Diego F, MD;  Location: [facility] ORS;  Service: General;  Laterality: N/A;  . TOTAL ABDOMINAL HYSTERECTOMY      There were no vitals filed for this visit.  Subjective Assessment - 04/01/19 1719    Subjective  Patient reports she went to the MD which reported no significant changes since the previous visit.    Pertinent History  Small bowel surgery     Limitations  Standing;Walking    Patient Stated Goals  To walk without  feeling of falling     Currently in Pain?  No/denies       TREATMENT Therapeutic Exercise Hip abduction in standing with UE support - 2 x 10 Hip extension in standing with UE support - 2 x 10 Nustep to improve strength and endurance in the UE/LE level 5 - x226min  Squats in standing with UE support - x15 Side stepping across airex pad with UE support - x 10 B Walking with focus on improving foot clearance in standing - 100 ft x  Performed exercises with airex pad to address balance concerns. Patient demonstrates difficulty with performing standing exercises with the airex pad secondary to difficulty with static balance.   PT Education - 04/01/19 1722    Education provided  Yes    Education Details  form/technique with exercise    Person(s) Educated  Patient    Methods  Explanation;Demonstration    Comprehension  Verbalized understanding;Returned demonstration          PT Long Term Goals - 03/25/19 1639      PT LONG TERM GOAL #1   Title  Patient will be independent with HEP focused on decreasing fall risk to continue benefits of therapy after discharge.     Baseline  Dependent with exercise performance; 01/01/2018: moderate cueing for performance. 03/28/18 min-mod  cueing required; 04/30/18: min cueing required; 09/24/2018: min cueing required; 11/12/2018: Patient performs exercise with little to no cueing.     Time  6    Period  Weeks    Status  Achieved      PT LONG TERM GOAL #2   Title  Patient will improve TUG to under 12 sec to indicate singificant decrease in fall risk.     Baseline  20sec; 01/01/2018: 14sec; 02/12/2018; 11.59 sec;    Time  8    Period  Weeks    Status  Achieved      PT LONG TERM GOAL #3   Title  Patient will improve her ability to perform 10mWT to 72275m/s to indicate increased safety with walking at community ambulation    Baseline  .804275m/s; 01/01/2018: .6631m/s; 02/12/2018: .3919m/s; 03/28/18 .94 m/s; 8/19/9: testing of goals deferred to next session; 06/28/2018: .2175m/s;  09/24/2018: deferred; 10/08/2018: .85 m/s; 11/12/2018:  .89 m/s; 02/05/2019: .1119m/s; 03/25/2019: .169275m/s    Time  8    Period  Weeks    Status  On-going      PT LONG TERM GOAL #4   Title  Patient will improve 5XSTS to under 16 sec to indicate significant improvement LE functional strength and decrease fall risk.    Baseline  30sec; 01/01/2018: 12 sec    Time  8    Period  Weeks    Status  Achieved      PT LONG TERM GOAL #5   Title  Pt will ambulate 56000ft in 3 minutes to demonstrate improved endurance with walking into department stores from the parking lot.     Baseline  03/28/18: 38350ft; 04/30/18: testing of goals deferred to next session; 06/28/2018: 28550ft; 09/24/2018: deferred; 10/08/2018: 25400ft; 11/12/2018: 53650ft with rollator; 02/04/2019: 54450ft    Time  6    Period  Weeks    Status  Achieved      Additional Long Term Goals   Additional Long Term Goals  Yes      PT LONG TERM GOAL #6   Title  Patient will be able to perform >107400ft in a 6minWT with a rollator to decrease fall risk with ambulating at community distances.    Baseline  89300ft with rollator    Time  6    Period  Weeks    Status  New    Target Date  05/06/19            Plan - 04/01/19 1726    Clinical Impression Statement  Patient demonstrates increased BP today at baseline of 160/80 mmHg and 180/480mmHg after exercises and performed lighter exercises to not elevate this further. Paient continues to demonstrate poor armswing and increased forward flexion with ambulation, this improved with verbal cueing, but she continues to demosntrates poor balance. Patient unable to fully encorporating cueing secondary to fatigue. Will continues to improve balance and walking quality to improve limitations and return to prior level of function.    Rehab Potential  Fair    Clinical Impairments Affecting Rehab Potential  (+) family support, decreased coormorbities (-) age    PT Frequency  1x / week    PT Duration  6 weeks    PT  Treatment/Interventions  Balance training;Neuromuscular re-education;Gait training;Patient/family education;Therapeutic exercise;Therapeutic activities;Functional mobility training;Stair training;Moist Heat;Aquatic Therapy;Electrical Stimulation;Cryotherapy;Manual techniques    PT Next Visit Plan  Attempt more dynamic balance tasks, ambulation over obstacles, ambulation with head turns, stepping response tasks    PT Home Exercise Plan  See  education    Consulted and Agree with Plan of Care  Patient       Patient will benefit from skilled therapeutic intervention in order to improve the following deficits and impairments:  Abnormal gait, Decreased coordination, Decreased endurance, Decreased activity tolerance, Decreased strength, Decreased range of motion, Decreased balance, Difficulty walking  Visit Diagnosis: 1. Difficulty in walking, not elsewhere classified   2. Muscle weakness (generalized)        Problem List Patient Active Problem List   Diagnosis Date Noted  . Atrial fibrillation ([place]) 04/26/2017  . Anemia 04/26/2017  . Ischemic bowel disease ([place]) 03/18/2017  . 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 Macular degeneration of left eye 01/06/2015  . Essential hypertension 12/25/2013  . BACK PAIN 08/02/2010    Blythe Stanford, PT DPT 04/01/2019, 5:42 PM  [place] [facility] [address], [place], [address] Phone: [phone]   Fax:  [phone]  Name: Paula Luna MRN: [id number] Date of Birth: 02-03-1925

## 2019-04-08 ENCOUNTER — Other Ambulatory Visit: Payer: Self-pay

## 2019-04-08 ENCOUNTER — Ambulatory Visit: Payer: PPO

## 2019-04-08 DIAGNOSIS — R262 Difficulty in walking, not elsewhere classified: Secondary | ICD-10-CM | POA: Diagnosis not present

## 2019-04-08 DIAGNOSIS — M6281 Muscle weakness (generalized): Secondary | ICD-10-CM

## 2019-04-08 NOTE — Therapy (Signed)
[facility] [address] [place], [place], [address] Phone: [phone]   Fax:  [phone]  Physical Therapy Treatment  Patient Details  Name: Paula CornsRoxanne D Derosia MRN: [id number] Date of Birth: 1924-09-17 No data recorded  Encounter Date: 04/08/2019  PT End of Session - 04/08/19 1608    Visit Number  57    Number of Visits  60    Date for PT Re-Evaluation  05/06/19    Authorization Type  FOTO    Authorization Time Period  5/10    PT Start Time  1606    PT Stop Time  1647    PT Time Calculation (min)  41 min    Equipment Utilized During Treatment  Gait belt    Activity Tolerance  Patient tolerated treatment well    Behavior During Therapy  WFL for tasks assessed/performed       Past Medical History:  Diagnosis Date  . Anemia   . Back pain   . Hypertension 12/25/2013  . Ischemic bowel disease (HCC)    a. 03/2017 abd pain-->internal herina and ischemic jejunum s/p ex lap and small bowel resection.  . Macular degeneration of left eye 01/06/2015  . Osteoporosis   . Persistent atrial fibrillation    a. First noted 03/2017;  b. 07/2017 Echo: EF 60-65%, no rwma, mild MR, mildly to mod dil LA; c. 07/2017 48hr Holter: persistent Afib, avg HR of 66 (range 39-140 bpm);  d. CHA2DS2VASc = 4-->Xarelto initiated 07/2017.    Past Surgical History:  Procedure Laterality Date  . CLOSED REDUCTION PROXIMAL HUMERUS FRACTURE Left 2017  . LAPAROSCOPIC SMALL BOWEL RESECTION    . LAPAROTOMY N/A 03/18/2017   Procedure: EXPLORATORY LAPAROTOMY, SMALL BOWEL RESECTION;  Surgeon: Leafy RoPabon, Diego F, MD;  Location: [facility] ORS;  Service: General;  Laterality: N/A;  . TOTAL ABDOMINAL HYSTERECTOMY      There were no vitals filed for this visit.  Subjective Assessment - 04/08/19 1608    Subjective  Patient stated that she is doing well today.    Pertinent History  Small bowel surgery     Limitations  Standing;Walking    Patient Stated Goals  To  walk without feeling of falling     Currently in Pain?  No/denies          TREATMENT Therapeutic Exercise Hip abduction in standing with UE support - 2 x 10 Hip extension in standing with UE support - 2 x 10 Nustep to improve strength and endurance in the UE/LE level 5 - x605min  Squats in standing with UE support, cues to keep heels on the ground x15 Side stepping across airex pad with UE support x5 rounds ea Tandem walking across airex pad with UE support x5 rounds Step ups onto step with UE support   Pt most challenged by balance activities this session, with fatigue pt had difficulty maintaining upright posture/exercise technique.    PT Education - 04/08/19 1608    Education provided  Yes    Education Details  form/technique with exercises    Person(s) Educated  Patient    Methods  Explanation;Demonstration    Comprehension  Verbalized understanding;Returned demonstration          PT Long Term Goals - 03/25/19 1639      PT LONG TERM GOAL #1   Title  Patient will be independent with HEP focused on decreasing fall risk to continue benefits of therapy after discharge.     Baseline  Dependent with exercise performance; 01/01/2018: moderate cueing for performance. 03/28/18 min-mod cueing required; 04/30/18: min cueing required; 09/24/2018: min cueing required; 11/12/2018: Patient performs exercise with little to no cueing.     Time  6    Period  Weeks    Status  Achieved      PT LONG TERM GOAL #2   Title  Patient will improve TUG to under 12 sec to indicate singificant decrease in fall risk.     Baseline  20sec; 01/01/2018: 14sec; 02/12/2018; 11.59 sec;    Time  8    Period  Weeks    Status  Achieved      PT LONG TERM GOAL #3   Title  Patient will improve her ability to perform 25mWT to 4m/s to indicate increased safety with walking at community ambulation    Baseline  .31m/s; 01/01/2018: .83m/s; 02/12/2018: .71m/s; 03/28/18 .94 m/s; 8/19/9: testing of goals deferred to next  session; 06/28/2018: .50m/s; 09/24/2018: deferred; 10/08/2018: .85 m/s; 11/12/2018:  .89 m/s; 02/05/2019: .74m/s; 03/25/2019: .83m/s    Time  8    Period  Weeks    Status  On-going      PT LONG TERM GOAL #4   Title  Patient will improve 5XSTS to under 16 sec to indicate significant improvement LE functional strength and decrease fall risk.    Baseline  30sec; 01/01/2018: 12 sec    Time  8    Period  Weeks    Status  Achieved      PT LONG TERM GOAL #5   Title  Pt will ambulate 570ft in 3 minutes to demonstrate improved endurance with walking into department stores from the parking lot.     Baseline  03/28/18: 378ft; 04/30/18: testing of goals deferred to next session; 06/28/2018: 293ft; 09/24/2018: deferred; 10/08/2018: 269ft; 11/12/2018: 536ft with rollator; 02/04/2019: 575ft    Time  6    Period  Weeks    Status  Achieved      Additional Long Term Goals   Additional Long Term Goals  Yes      PT LONG TERM GOAL #6   Title  Patient will be able to perform >1088ft in a 33minWT with a rollator to decrease fall risk with ambulating at community distances.    Baseline  875ft with rollator    Time  6    Period  Weeks    Status  New    Target Date  05/06/19            Plan - 04/08/19 1659    Clinical Impression Statement  Pt BP at start of session today 158/80. Patient needed several seated rest breaks to maximize her ability to participate in therapeutic exercises, as well as verbal cues for breathing during exercises. Pt demonstrated excellent motivation to participate in therapy. With fatigue pt exhibited difficulty maintaining upright posture and exercise form, intermittent verbal cues given to improve form. The patient would benefit from further skilled PT to continue to address limitations and maximize pt functional mobility/QOL.    Rehab Potential  Fair    Clinical Impairments Affecting Rehab Potential  (+) family support, decreased coormorbities (-) age    PT Frequency  1x / week    PT  Duration  6 weeks    PT Treatment/Interventions  Balance training;Neuromuscular re-education;Gait training;Patient/family education;Therapeutic exercise;Therapeutic activities;Functional mobility training;Stair training;Moist Heat;Aquatic Therapy;Electrical Stimulation;Cryotherapy;Manual techniques    PT Next Visit Plan  Attempt more dynamic balance tasks, ambulation over obstacles, ambulation with head  turns, stepping response tasks    PT Home Exercise Plan  See education       Patient will benefit from skilled therapeutic intervention in order to improve the following deficits and impairments:  Abnormal gait, Decreased coordination, Decreased endurance, Decreased activity tolerance, Decreased strength, Decreased range of motion, Decreased balance, Difficulty walking  Visit Diagnosis: 1. Difficulty in walking, not elsewhere classified   2. Muscle weakness (generalized)        Problem List Patient Active Problem List   Diagnosis Date Noted  . Atrial fibrillation (HCC) 04/26/2017  . Anemia 04/26/2017  . 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 Macular degeneration of left eye 01/06/2015  . Essential hypertension 12/25/2013  . BACK PAIN 08/02/2010    Olga Coasteriana Natalya Domzalski PT, DPT 5:05 PM,04/08/19 [phone]  [facility] [address] [place], [place], [address] Phone: [phone]   Fax:  [phone]  Name: Paula CornsRoxanne D Pangle MRN: [id number] Date of Birth: Nov 19, 1924

## 2019-04-15 DIAGNOSIS — H02826 Cysts of left eye, unspecified eyelid: Secondary | ICD-10-CM | POA: Diagnosis not present

## 2019-04-15 DIAGNOSIS — H0288B Meibomian gland dysfunction left eye, upper and lower eyelids: Secondary | ICD-10-CM | POA: Diagnosis not present

## 2019-04-15 DIAGNOSIS — H353124 Nonexudative age-related macular degeneration, left eye, advanced atrophic with subfoveal involvement: Secondary | ICD-10-CM | POA: Diagnosis not present

## 2019-04-15 DIAGNOSIS — H43393 Other vitreous opacities, bilateral: Secondary | ICD-10-CM | POA: Diagnosis not present

## 2019-04-15 DIAGNOSIS — H0102A Squamous blepharitis right eye, upper and lower eyelids: Secondary | ICD-10-CM | POA: Diagnosis not present

## 2019-04-15 DIAGNOSIS — H0102B Squamous blepharitis left eye, upper and lower eyelids: Secondary | ICD-10-CM | POA: Diagnosis not present

## 2019-04-15 DIAGNOSIS — H0288A Meibomian gland dysfunction right eye, upper and lower eyelids: Secondary | ICD-10-CM | POA: Diagnosis not present

## 2019-04-15 DIAGNOSIS — H3554 Dystrophies primarily involving the retinal pigment epithelium: Secondary | ICD-10-CM | POA: Diagnosis not present

## 2019-04-15 DIAGNOSIS — H353112 Nonexudative age-related macular degeneration, right eye, intermediate dry stage: Secondary | ICD-10-CM | POA: Diagnosis not present

## 2019-04-15 DIAGNOSIS — H11823 Conjunctivochalasis, bilateral: Secondary | ICD-10-CM | POA: Diagnosis not present

## 2019-04-15 DIAGNOSIS — H52203 Unspecified astigmatism, bilateral: Secondary | ICD-10-CM | POA: Diagnosis not present

## 2019-04-15 DIAGNOSIS — H5201 Hypermetropia, right eye: Secondary | ICD-10-CM | POA: Diagnosis not present

## 2019-04-15 DIAGNOSIS — H524 Presbyopia: Secondary | ICD-10-CM | POA: Diagnosis not present

## 2019-04-15 DIAGNOSIS — Z961 Presence of intraocular lens: Secondary | ICD-10-CM | POA: Diagnosis not present

## 2019-04-17 ENCOUNTER — Other Ambulatory Visit: Payer: Self-pay

## 2019-04-17 ENCOUNTER — Ambulatory Visit: Payer: PPO | Attending: Family Medicine

## 2019-04-17 DIAGNOSIS — H547 Unspecified visual loss: Secondary | ICD-10-CM | POA: Insufficient documentation

## 2019-04-17 DIAGNOSIS — R262 Difficulty in walking, not elsewhere classified: Secondary | ICD-10-CM | POA: Insufficient documentation

## 2019-04-17 DIAGNOSIS — M6281 Muscle weakness (generalized): Secondary | ICD-10-CM | POA: Diagnosis not present

## 2019-04-17 NOTE — Therapy (Signed)
[facility] [address] [place], [place], [address] Phone: [phone]   Fax:  [phone]  Physical Therapy Treatment  Patient Details  Name: Paula Luna MRN: [id number] Date of Birth: [birth year] No data recorded  Encounter Date: 04/17/2019  PT End of Session - 04/17/19 1739    Visit Number  58    Number of Visits  60    Date for PT Re-Evaluation  05/06/19    Authorization Type  FOTO    PT Start Time  1700    PT Stop Time  1745    PT Time Calculation (min)  45 min    Equipment Utilized During Treatment  Gait belt    Activity Tolerance  Patient tolerated treatment well    Behavior During Therapy  WFL for tasks assessed/performed       Past Medical History:  Diagnosis Date  . Anemia   . Back pain   . Hypertension 12/25/2013  . Ischemic bowel disease (HCC)    a. 03/2017 abd pain-->internal herina and ischemic jejunum s/p ex lap and small bowel resection.  . Macular degeneration of left eye 01/06/2015  . Osteoporosis   . Persistent atrial fibrillation    a. First noted 03/2017;  b. 07/2017 Echo: EF 60-65%, no rwma, mild MR, mildly to mod dil LA; c. 07/2017 48hr Holter: persistent Afib, avg HR of 66 (range 39-140 bpm);  d. CHA2DS2VASc = 4-->Xarelto initiated 07/2017.    Past Surgical History:  Procedure Laterality Date  . CLOSED REDUCTION PROXIMAL HUMERUS FRACTURE Left 2017  . LAPAROSCOPIC SMALL BOWEL RESECTION    . LAPAROTOMY N/A 03/18/2017   Procedure: EXPLORATORY LAPAROTOMY, SMALL BOWEL RESECTION;  Surgeon: Leafy RoPabon, Diego F, MD;  Location: [facility] ORS;  Service: General;  Laterality: N/A;  . TOTAL ABDOMINAL HYSTERECTOMY      There were no vitals filed for this visit.  Subjective Assessment - 04/17/19 1716    Subjective  Patient reports no major changes today, Patient reports she has been performing stair climbing.    Pertinent History  Small bowel surgery     Limitations  Standing;Walking    Patient Stated  Goals  To walk without feeling of falling     Currently in Pain?  No/denies         TREATMENT Therapeutic Exercise Hip abduction in standing with UE support - 2 x 10 Hip extension in standing with UE support - 2 x 10 Nustep to improve strength and endurance in the UE/LE level 5 - x336min  Step ups onto 6" step - x 10 B Soccer kicks in standing - x 10 with changing Side stepping across airex pad with UE support x5 rounds each Tandem walking across airex pad with UE support x5 rounds    Pt most challenged by balance activities this session, with fatigue pt had difficulty maintaining upright posture/exercise technique   PT Education - 04/17/19 1738    Education provided  Yes    Education Details  form/technique with exercises    Person(s) Educated  Patient    Methods  Explanation;Demonstration    Comprehension  Verbalized understanding;Returned demonstration          PT Long Term Goals - 03/25/19 1639      PT LONG TERM GOAL #1   Title  Patient will be independent with HEP focused on decreasing fall risk to continue benefits of therapy after discharge.     Baseline  Dependent with exercise performance; 01/01/2018:  moderate cueing for performance. 03/28/18 min-mod cueing required; 04/30/18: min cueing required; 09/24/2018: min cueing required; 11/12/2018: Patient performs exercise with little to no cueing.     Time  6    Period  Weeks    Status  Achieved      PT LONG TERM GOAL #2   Title  Patient will improve TUG to under 12 sec to indicate singificant decrease in fall risk.     Baseline  20sec; 01/01/2018: 14sec; 02/12/2018; 11.59 sec;    Time  8    Period  Weeks    Status  Achieved      PT LONG TERM GOAL #3   Title  Patient will improve her ability to perform 16mWT to 8m/s to indicate increased safety with walking at community ambulation    Baseline  .77m/s; 01/01/2018: .37m/s; 02/12/2018: .41m/s; 03/28/18 .94 m/s; 8/19/9: testing of goals deferred to next session; 06/28/2018: .60m/s;  09/24/2018: deferred; 10/08/2018: .85 m/s; 11/12/2018:  .89 m/s; 02/05/2019: .69m/s; 03/25/2019: .25m/s    Time  8    Period  Weeks    Status  On-going      PT LONG TERM GOAL #4   Title  Patient will improve 5XSTS to under 16 sec to indicate significant improvement LE functional strength and decrease fall risk.    Baseline  30sec; 01/01/2018: 12 sec    Time  8    Period  Weeks    Status  Achieved      PT LONG TERM GOAL #5   Title  Pt will ambulate 543ft in 3 minutes to demonstrate improved endurance with walking into department stores from the parking lot.     Baseline  03/28/18: 383ft; 04/30/18: testing of goals deferred to next session; 06/28/2018: 277ft; 09/24/2018: deferred; 10/08/2018: 265ft; 11/12/2018: 53ft with rollator; 02/04/2019: 536ft    Time  6    Period  Weeks    Status  Achieved      Additional Long Term Goals   Additional Long Term Goals  Yes      PT LONG TERM GOAL #6   Title  Patient will be able to perform >1065ft in a 76minWT with a rollator to decrease fall risk with ambulating at community distances.    Baseline  857ft with rollator    Time  6    Period  Weeks    Status  New    Target Date  05/06/19            Plan - 04/17/19 1754    Clinical Impression Statement  Patient demosntrates ability to perform greater amount of exercises today versus previous sessions indicating functional carryover between sessions. Patient demonstrates decreased arm swing when ambulating with focusing on improving progressing UE activation of reciprocating arm swing. Patient still demonstrates decreased strength with ambulation and stair climbing and patient will benefit from further skilled therapy to return to prior level of function.    Rehab Potential  Fair    Clinical Impairments Affecting Rehab Potential  (+) family support, decreased coormorbities (-) age    PT Frequency  1x / week    PT Duration  6 weeks    PT Treatment/Interventions  Balance training;Neuromuscular  re-education;Gait training;Patient/family education;Therapeutic exercise;Therapeutic activities;Functional mobility training;Stair training;Moist Heat;Aquatic Therapy;Electrical Stimulation;Cryotherapy;Manual techniques    PT Next Visit Plan  Attempt more dynamic balance tasks, ambulation over obstacles, ambulation with head turns, stepping response tasks    PT Home Exercise Plan  See education  Patient will benefit from skilled therapeutic intervention in order to improve the following deficits and impairments:  Abnormal gait, Decreased coordination, Decreased endurance, Decreased activity tolerance, Decreased strength, Decreased range of motion, Decreased balance, Difficulty walking  Visit Diagnosis: 1. Difficulty in walking, not elsewhere classified   2. Muscle weakness (generalized)        Problem List Patient Active Problem List   Diagnosis Date Noted  . Atrial fibrillation (HCC) 04/26/2017  . Anemia 04/26/2017  . 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 Macular degeneration of left eye 01/06/2015  . Essential hypertension 12/25/2013  . BACK PAIN 08/02/2010    Myrene GalasWesley Hebe Merriwether, PT DPT 04/17/2019, 6:06 PM  [facility] [address] [place], [place], [address] Phone: [phone]   Fax:  [phone]  Name: Paula Luna MRN: [id number] Date of Birth: [birth year]

## 2019-04-23 ENCOUNTER — Ambulatory Visit: Payer: PPO

## 2019-04-23 ENCOUNTER — Other Ambulatory Visit: Payer: Self-pay

## 2019-04-23 DIAGNOSIS — R262 Difficulty in walking, not elsewhere classified: Secondary | ICD-10-CM | POA: Diagnosis not present

## 2019-04-23 DIAGNOSIS — M6281 Muscle weakness (generalized): Secondary | ICD-10-CM

## 2019-04-24 ENCOUNTER — Telehealth: Payer: Self-pay | Admitting: Family Medicine

## 2019-04-24 DIAGNOSIS — H3530 Unspecified macular degeneration: Secondary | ICD-10-CM

## 2019-04-24 NOTE — Therapy (Signed)
[facility] PHYSICAL AND SP[facility] MEDICINE [address] [place], [place], [address] Phone: [phone]   Fax:  [phone]  Physical Therapy Treatment  Patient Details  Name: Paula CornsRoxanne D Barajas MRN: [id number] Date of Birth: 22-Apr-1925 No data recorded  Encounter Date: 04/23/2019  PT End of Session - 04/24/19 1458    Visit Number  59    Number of Visits  60    Date for PT Re-Evaluation  05/06/19    Authorization Type  FOTO    PT Start Time  1645    PT Stop Time  1730    PT Time Calculation (min)  45 min    Equipment Utilized During Treatment  Gait belt    Activity Tolerance  Patient tolerated treatment well    Behavior During Therapy  WFL for tasks assessed/performed       Past Medical History:  Diagnosis Date  . Anemia   . Back pain   . Hypertension 12/25/2013  . Ischemic bowel disease (HCC)    a. 03/2017 abd pain-->internal herina and ischemic jejunum s/p ex lap and small bowel resection.  . Macular degeneration of left eye 01/06/2015  . Osteoporosis   . Persistent atrial fibrillation    a. First noted 03/2017;  b. 07/2017 Echo: EF 60-65%, no rwma, mild MR, mildly to mod dil LA; c. 07/2017 48hr Holter: persistent Afib, avg HR of 66 (range 39-140 bpm);  d. CHA2DS2VASc = 4-->Xarelto initiated 07/2017.    Past Surgical History:  Procedure Laterality Date  . CLOSED REDUCTION PROXIMAL HUMERUS FRACTURE Left 2017  . LAPAROSCOPIC SMALL BOWEL RESECTION    . LAPAROTOMY N/A 03/18/2017   Procedure: EXPLORATORY LAPAROTOMY, SMALL BOWEL RESECTION;  Surgeon: Leafy RoPabon, Diego F, MD;  Location: [facility] ORS;  Service: General;  Laterality: N/A;  . TOTAL ABDOMINAL HYSTERECTOMY      There were no vitals filed for this visit.  Subjective Assessment - 04/23/19 1650    Subjective  Patient reports no significant differences since the previous session.    Pertinent History  Small bowel surgery     Limitations  Standing;Walking    Patient Stated Goals  To walk without  feeling of falling     Currently in Pain?  No/denies       TREATMENT Therapeutic Exercise Side stepping on airex pad - x 10 with intermittent UE support Nustep to improve strength and endurance in the UE/LE level 4 - x7010min  Hip abduction in standing with UE support -  x 12 Hip extension in standing with UE support -  x 12 Heel raises - x 12 with UE support  Soccer kicks in standing - x 12 with changing LEs   Pt most challenged by balance activities this session, with fatigue pt had difficulty maintaining upright posture/exercise technique  PT Education - 04/24/19 1457    Education provided  Yes    Education Details  form/technique with exercise    Person(s) Educated  Patient    Methods  Explanation;Demonstration    Comprehension  Verbalized understanding;Returned demonstration          PT Long Term Goals - 03/25/19 1639      PT LONG TERM GOAL #1   Title  Patient will be independent with HEP focused on decreasing fall risk to continue benefits of therapy after discharge.     Baseline  Dependent with exercise performance; 01/01/2018: moderate cueing for performance. 03/28/18 min-mod cueing required; 04/30/18: min cueing required; 09/24/2018: min cueing required; 11/12/2018:  Patient performs exercise with little to no cueing.     Time  6    Period  Weeks    Status  Achieved      PT LONG TERM GOAL #2   Title  Patient will improve TUG to under 12 sec to indicate singificant decrease in fall risk.     Baseline  20sec; 01/01/2018: 14sec; 02/12/2018; 11.59 sec;    Time  8    Period  Weeks    Status  Achieved      PT LONG TERM GOAL #3   Title  Patient will improve her ability to perform 15mWT to 22m/s to indicate increased safety with walking at community ambulation    Baseline  .58m/s; 01/01/2018: .61m/s; 02/12/2018: .28m/s; 03/28/18 .94 m/s; 8/19/9: testing of goals deferred to next session; 06/28/2018: .3m/s; 09/24/2018: deferred; 10/08/2018: .85 m/s; 11/12/2018:  .89 m/s; 02/05/2019: .36m/s;  03/25/2019: .17m/s    Time  8    Period  Weeks    Status  On-going      PT LONG TERM GOAL #4   Title  Patient will improve 5XSTS to under 16 sec to indicate significant improvement LE functional strength and decrease fall risk.    Baseline  30sec; 01/01/2018: 12 sec    Time  8    Period  Weeks    Status  Achieved      PT LONG TERM GOAL #5   Title  Pt will ambulate 557ft in 3 minutes to demonstrate improved endurance with walking into department stores from the parking lot.     Baseline  03/28/18: 378ft; 04/30/18: testing of goals deferred to next session; 06/28/2018: 261ft; 09/24/2018: deferred; 10/08/2018: 281ft; 11/12/2018: 574ft with rollator; 02/04/2019: 520ft    Time  6    Period  Weeks    Status  Achieved      Additional Long Term Goals   Additional Long Term Goals  Yes      PT LONG TERM GOAL #6   Title  Patient will be able to perform >1076ft in a 18minWT with a rollator to decrease fall risk with ambulating at community distances.    Baseline  849ft with rollator    Time  6    Period  Weeks    Status  New    Target Date  05/06/19            Plan - 04/24/19 1500    Clinical Impression Statement  Patient demonstrates increased fatigue during today's session and is hesistant to perform exercises in standing. Patient demonstrates improvement with greater ability to perform greater amount of repetitions compared to previous sessions. Although patient is improving in this regard, she continues to have difficulty with prolonged standing and ambulating. Patient will benefit from further skilled therapy to return to prior level of function.    Rehab Potential  Fair    Clinical Impairments Affecting Rehab Potential  (+) family support, decreased coormorbities (-) age    PT Frequency  1x / week    PT Duration  6 weeks    PT Treatment/Interventions  Balance training;Neuromuscular re-education;Gait training;Patient/family education;Therapeutic exercise;Therapeutic activities;Functional  mobility training;Stair training;Moist Heat;Aquatic Therapy;Electrical Stimulation;Cryotherapy;Manual techniques    PT Next Visit Plan  Attempt more dynamic balance tasks, ambulation over obstacles, ambulation with head turns, stepping response tasks    PT Home Exercise Plan  See education       Patient will benefit from skilled therapeutic intervention in order to improve the following deficits and impairments:  Abnormal gait, Decreased coordination, Decreased endurance, Decreased activity tolerance, Decreased strength, Decreased range of motion, Decreased balance, Difficulty walking  Visit Diagnosis: 1. Difficulty in walking, not elsewhere classified   2. Muscle weakness (generalized)        Problem List Patient Active Problem List   Diagnosis Date Noted  . Atrial fibrillation (HCC) 04/26/2017  . Anemia 04/26/2017  . 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 Macular degeneration of left eye 01/06/2015  . Essential hypertension 12/25/2013  . BACK PAIN 08/02/2010    Myrene GalasWesley Takumi Din, PT DPT 04/24/2019, 3:34 PM  [facility]TS MEDICINE [address] [place], [place], [address] Phone: [phone]   Fax:  [phone]  Name: Paula CornsRoxanne D Erker MRN: [id number] Date of Birth: [birth year]

## 2019-04-24 NOTE — Telephone Encounter (Signed)
Patient's daughter, [name], called.  Patient went to the eye doctor about her maculor degeneration.  The doctor recommended that patient have occupational therapy done. [name] found [facility] can do occupational therapy, but they need an order from [facility].  They will help with what to do around the house for her to be able to get around better.

## 2019-04-29 ENCOUNTER — Ambulatory Visit: Payer: PPO

## 2019-04-29 ENCOUNTER — Other Ambulatory Visit: Payer: Self-pay

## 2019-04-29 DIAGNOSIS — R262 Difficulty in walking, not elsewhere classified: Secondary | ICD-10-CM | POA: Diagnosis not present

## 2019-04-29 DIAGNOSIS — M6281 Muscle weakness (generalized): Secondary | ICD-10-CM

## 2019-04-29 NOTE — Therapy (Signed)
[facility] PHYSICAL [facility]ORTS MEDICINE [address] [place], [place], [address] Phone: [phone]   Fax:  [phone]  Physical Therapy Treatment  Patient Details  Name: Paula Luna MRN: [id number] Date of Birth: [birth year] No data recorded  Encounter Date: 04/29/2019  PT End of Session - 04/29/19 1654    Visit Number  60    Number of Visits  60    Date for PT Re-Evaluation  05/06/19    Authorization Type  FOTO    Authorization Time Period  6/10    PT Start Time  1646    PT Stop Time  1727    PT Time Calculation (min)  41 min    Equipment Utilized During Treatment  Gait belt    Activity Tolerance  Patient tolerated treatment well    Behavior During Therapy  WFL for tasks assessed/performed       Past Medical History:  Diagnosis Date  . Anemia   . Back pain   . Hypertension 12/25/2013  . Ischemic bowel disease (HCC)    a. 03/2017 abd pain-->internal herina and ischemic jejunum s/p ex lap and small bowel resection.  . Macular degeneration of left eye 01/06/2015  . Osteoporosis   . Persistent atrial fibrillation    a. First noted 03/2017;  b. 07/2017 Echo: EF 60-65%, no rwma, mild MR, mildly to mod dil LA; c. 07/2017 48hr Holter: persistent Afib, avg HR of 66 (range 39-140 bpm);  d. CHA2DS2VASc = 4-->Xarelto initiated 07/2017.    Past Surgical History:  Procedure Laterality Date  . CLOSED REDUCTION PROXIMAL HUMERUS FRACTURE Left 2017  . LAPAROSCOPIC SMALL BOWEL RESECTION    . LAPAROTOMY N/A 03/18/2017   Procedure: EXPLORATORY LAPAROTOMY, SMALL BOWEL RESECTION;  Surgeon: Leafy RoPabon, Diego F, MD;  Location: [facility] ORS;  Service: General;  Laterality: N/A;  . TOTAL ABDOMINAL HYSTERECTOMY      There were no vitals filed for this visit.  Subjective Assessment - 04/29/19 1652    Subjective  Patient reported that she is doing well, had some dizziness this morning, no dizziness now. No falls in stumbles since last PT session.    Pertinent  History  Small bowel surgery     Limitations  Standing;Walking    Patient Stated Goals  To walk without feeling of falling     Currently in Pain?  No/denies       TREATMENT Therapeutic Exercise Side stepping on airex pad - x 10 with intermittent UE support Nustep to improve strength and endurance in the UE/LE level 4 - x4610min  Hip abduction in standing with UE support -  x 12 Hip extension in standing with UE support -  x 12 Knee flexion x12 with UE support  Marching x12 with UE support Soccer kicks in standing - x 12 with changing LEs Ambulation in clinic x3 minutes   Pt most challenged by balance activities this session, with fatigue pt had difficulty maintaining upright posture/exercise technique     PT Education - 04/29/19 1653    Education provided  Yes    Education Details  form/technique with exercise    Person(s) Educated  Patient    Methods  Explanation;Demonstration    Comprehension  Verbalized understanding;Returned demonstration          PT Long Term Goals - 03/25/19 1639      PT LONG TERM GOAL #1   Title  Patient will be independent with HEP focused on decreasing fall risk to continue  benefits of therapy after discharge.     Baseline  Dependent with exercise performance; 01/01/2018: moderate cueing for performance. 03/28/18 min-mod cueing required; 04/30/18: min cueing required; 09/24/2018: min cueing required; 11/12/2018: Patient performs exercise with little to no cueing.     Time  6    Period  Weeks    Status  Achieved      PT LONG TERM GOAL #2   Title  Patient will improve TUG to under 12 sec to indicate singificant decrease in fall risk.     Baseline  20sec; 01/01/2018: 14sec; 02/12/2018; 11.59 sec;    Time  8    Period  Weeks    Status  Achieved      PT LONG TERM GOAL #3   Title  Patient will improve her ability to perform 59mWT to 35m/s to indicate increased safety with walking at community ambulation    Baseline  .58m/s; 01/01/2018: .39m/s; 02/12/2018:  .49m/s; 03/28/18 .94 m/s; 8/19/9: testing of goals deferred to next session; 06/28/2018: .72m/s; 09/24/2018: deferred; 10/08/2018: .85 m/s; 11/12/2018:  .89 m/s; 02/05/2019: .84m/s; 03/25/2019: .40m/s    Time  8    Period  Weeks    Status  On-going      PT LONG TERM GOAL #4   Title  Patient will improve 5XSTS to under 16 sec to indicate significant improvement LE functional strength and decrease fall risk.    Baseline  30sec; 01/01/2018: 12 sec    Time  8    Period  Weeks    Status  Achieved      PT LONG TERM GOAL #5   Title  Pt will ambulate 528ft in 3 minutes to demonstrate improved endurance with walking into department stores from the parking lot.     Baseline  03/28/18: 371ft; 04/30/18: testing of goals deferred to next session; 06/28/2018: 272ft; 09/24/2018: deferred; 10/08/2018: 250ft; 11/12/2018: 510ft with rollator; 02/04/2019: 574ft    Time  6    Period  Weeks    Status  Achieved      Additional Long Term Goals   Additional Long Term Goals  Yes      PT LONG TERM GOAL #6   Title  Patient will be able to perform >1014ft in a 59minWT with a rollator to decrease fall risk with ambulating at community distances.    Baseline  869ft with rollator    Time  6    Period  Weeks    Status  New    Target Date  05/06/19            Plan - 04/29/19 1654    Clinical Impression Statement  Patient was able to perform exercises with 2-3 sitting rest breaks. PT provided encouragement and active listening as appropriate. Most challenged by standing exercises. The patient would benefit from further skilled PT to continue to progress towards goals and improve QOL.    Rehab Potential  Fair    Clinical Impairments Affecting Rehab Potential  (+) family support, decreased coormorbities (-) age    PT Frequency  1x / week    PT Duration  6 weeks    PT Treatment/Interventions  Balance training;Neuromuscular re-education;Gait training;Patient/family education;Therapeutic exercise;Therapeutic  activities;Functional mobility training;Stair training;Moist Heat;Aquatic Therapy;Electrical Stimulation;Cryotherapy;Manual techniques    PT Next Visit Plan  Attempt more dynamic balance tasks, ambulation over obstacles, ambulation with head turns, stepping response tasks    PT Home Exercise Plan  See education    Consulted and Agree with Plan of Care  Patient    Family Member Consulted  daughter       Patient will benefit from skilled therapeutic intervention in order to improve the following deficits and impairments:  Abnormal gait, Decreased coordination, Decreased endurance, Decreased activity tolerance, Decreased strength, Decreased range of motion, Decreased balance, Difficulty walking  Visit Diagnosis: 1. Difficulty in walking, not elsewhere classified   2. Muscle weakness (generalized)        Problem List Patient Active Problem List   Diagnosis Date Noted  . Atrial fibrillation (HCC) 04/26/2017  . Anemia 04/26/2017  . 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 Macular degeneration of left eye 01/06/2015  . Essential hypertension 12/25/2013  . BACK PAIN 08/02/2010   Olga Coasteriana Mikylah Ackroyd PT, DPT 5:35 PM,04/29/19 [phone]  [facility] [address] [place], [place], [address] Phone: [phone]   Fax:  [phone]  Name: Paula CornsRoxanne D Luna MRN: [id number] Date of Birth: Jul 30, 1925

## 2019-04-30 ENCOUNTER — Ambulatory Visit: Payer: PPO | Admitting: Occupational Therapy

## 2019-04-30 ENCOUNTER — Other Ambulatory Visit: Payer: Self-pay

## 2019-04-30 ENCOUNTER — Encounter: Payer: Self-pay | Admitting: Occupational Therapy

## 2019-04-30 DIAGNOSIS — R262 Difficulty in walking, not elsewhere classified: Secondary | ICD-10-CM | POA: Diagnosis not present

## 2019-04-30 DIAGNOSIS — H547 Unspecified visual loss: Secondary | ICD-10-CM

## 2019-04-30 NOTE — Therapy (Signed)
[facility] [address] [place], [place], [address] Phone: [phone]   Fax:  [phone]  Occupational Therapy Evaluation  Patient Details  Name: Paula Luna MRN: [id number] Date of Birth: 1925/05/21 No data recorded  Encounter Date: 04/30/2019  OT End of Session - 04/30/19 1804    Visit Number  1    Number of Visits  6    Date for OT Re-Evaluation  06/11/19    OT Start Time  1615    OT Stop Time  1715    OT Time Calculation (min)  60 min    Activity Tolerance  Patient tolerated treatment well    Behavior During Therapy  [facility] for tasks assessed/performed       Past Medical History:  Diagnosis Date  . Anemia   . Back pain   . Hypertension 12/25/2013  . Ischemic bowel disease (HCC)    a. 03/2017 abd pain-->internal herina and ischemic jejunum s/p ex lap and small bowel resection.  . Macular degeneration of left eye 01/06/2015  . Osteoporosis   . Persistent atrial fibrillation    a. First noted 03/2017;  b. 07/2017 Echo: EF 60-65%, no rwma, mild MR, mildly to mod dil LA; c. 07/2017 48hr Holter: persistent Afib, avg HR of 66 (range 39-140 bpm);  d. CHA2DS2VASc = 4-->Xarelto initiated 07/2017.    Past Surgical History:  Procedure Laterality Date  . CLOSED REDUCTION PROXIMAL HUMERUS FRACTURE Left 2017  . LAPAROSCOPIC SMALL BOWEL RESECTION    . LAPAROTOMY N/A 03/18/2017   Procedure: EXPLORATORY LAPAROTOMY, SMALL BOWEL RESECTION;  Surgeon: Leafy RoPabon, Diego F, MD;  Location: [facility] ORS;  Service: General;  Laterality: N/A;  . TOTAL ABDOMINAL HYSTERECTOMY      There were no vitals filed for this visit.  Subjective Assessment - 04/30/19 1620    Subjective   [facility] [address] [facility]    Patient is accompanied by:  Family member    Pertinent History  Pt. is a 85 y.o. female who has been diagnosed with Macular Degeneration. Pt. PMHx includes: Essential HTN, IBS, A-Fib, and Anemia. Pt.  resides with her daughter, is retired, and enjoys watching her great granddaughter's soccer games, and watching TV.    Patient Stated Goals  To find strategies to help make daily tasks easier with her vision.    Currently in Pain?  No/denies        [facility] - 04/30/19 1624      Assessment   Medical Diagnosis  Macular Degeneration    Onset Date/Surgical Date  09/12/12    Hand Dominance  Right    Next MD Visit  Unknown      Precautions   Precautions  Fall      Restrictions   Weight Bearing Restrictions  No      Balance Screen   Has the patient fallen in the past 6 months  No    Has the patient had a decrease in activity level because of a fear of falling?   Yes    Is the patient reluctant to leave their home because of a fear of falling?   Yes      Home  Environment   Family/patient expects to be discharged to:  Private residence    Living Arrangements  Children    Available Help at Discharge  Family    Type of Home  House    Home Access  Stairs    Home Layout  Two level    Bathroom Shower/Tub  Tub/Shower unit    Shower/tub characteristics  Curtain    Engineer, maintenance (IT)Bathroom Toilet  Standard    Home Equipment  Walker - 4 wheels;Grab bars - tub/shower    Lives With  Daughter      Prior Function   Level of Independence  Independent    Vocation  Retired    Leisure  watching her great grand daughter play soccer., visiting, watching TV      ADL   Eating/Feeding  Independent   setup of food on the plate. Difficulty scooping servings    Grooming  Independent    Upper Body Bathing  Independent    Lower Body Bathing  Independent    Upper Body Dressing  Independent    Lower Body Dressing  Independent   Difficulty sorting dark socks.   Toileting - ArchitectClothing Manipulation  Independent      IADL   Prior Level of Function Shopping  Independent    Shopping  Completely unable to shop    Prior Level of Function Light Housekeeping  Independent    Light Housekeeping  Performs light  daily tasks such as dishwashing, bed making;Needs help with all home maintenance tasks    Prior Level of Function Meal Prep  Independent    Meal Prep  Able to complete simple warm meal prep   Difficulty with the oven    Prior Level of Function CuratorCommunity Mobility  Independent    Community Mobility  Relies on family or friends for transportation    Prior Level of Function Medication Managment  Independent    Medication Management  Takes responsibility if medication is prepared in advance in seprate dosage. Daughter sets up a pillbox.   Prior Level of Function Financial Management  Independent    Financial Management  Requires assistance      Written Expression   Dominant Hand  Right    Handwriting  75% legible      Vision - History   Baseline Vision  Wears glasses all the time      Cognition   Overall Cognitive Status  Within Functional Limits for tasks assessed      Sensation   Light Touch  Appears Intact      Coordination   Right 9 Hole Peg Test  41 sec.    Left 9 Hole Peg Test  57 sec.      Strength   Overall Strength Comments  BUE strength WFL       Hand Function   Right Hand Grip (lbs)  26#    Right Hand Lateral Pinch  10 lbs    Right Hand 3 Point Pinch  10 lbs    Left Hand Grip (lbs)  23#    Left Hand Lateral Pinch  9 lbs    Left 3 point pinch  9 lbs                           OT Long Term Goals - 04/30/19 1829      OT LONG TERM GOAL #1   Title  Pt. will identify, and utilize visual compensatory strategies for basic ADL care tasks 100% of the time.    Baseline  Pt. is limited by vision    Time  6    Period  Weeks    Status  New    Target Date  06/11/19  OT LONG TERM GOAL #2   Title  Pt. will identify, and utilize visual compensatory strategies for IADL, and kitchen related tasks 100% of the time to promote safety, and independence.    Baseline  Pt. is limited by vision    Time  6    Period  Weeks    Status  New             Plan - 04/30/19 1809    Clinical Impression Statement  Pt. is a 85 y.o. female who was diagnosed with Macular Degeneration with the left eye affected worse than the right eye. Pt. presents with visual impairments which limit her ability to perform daily ADL, and IADL tasks. Pt. could benefit from OT services to provide pt./caregiver education about visual compensatory strategies during ADL, and IADL tasks including: cooking, serving herself meal portions, sorting clothing, organizing, and retreiving items in cabinets, closets, the refrigerator, using the telephone, and appliances to promote safety and independence during ADLS, and IADL tasks.The pt. and her daughter were assisted in problem solving through visual compensatory strategies with telephone, and appliance use. White, and black locator buttons were provided.   OT Occupational Profile and History  Detailed Assessment- Review of Records and additional review of physical, cognitive, psychosocial history related to current functional performance    Occupational performance deficits (Please refer to evaluation for details):  IADL's;ADL's    Body Structure / Function / Physical Skills  ADL;IADL    Rehab Potential  Good    Clinical Decision Making  Several treatment options, min-mod task modification necessary    Modification or Assistance to Complete Evaluation   Min-Moderate modification of tasks or assist with assess necessary to complete eval    OT Frequency  1x / week    OT Duration  6 weeks    OT Treatment/Interventions  Self-care/ADL training;Visual/perceptual remediation/compensation;Therapeutic activities;DME and/or AE instruction;Patient/family education    Consulted and Agree with Plan of Care  Patient;Family member/caregiver    Family Member Consulted  Daughter: [name]       Patient will benefit from skilled therapeutic intervention in order to improve the following deficits and impairments:   Body Structure / Function /  Physical Skills: ADL, IADL       Visit Diagnosis: 1. Visual impairment       Problem List Patient Active Problem List   Diagnosis Date Noted  . Atrial fibrillation ([place]) 04/26/2017  . Anemia 04/26/2017  . Ischemic bowel disease ([place]) 03/18/2017  . Ischemic necrosis of small bowel ([place])   . Macular degeneration of left eye 01/06/2015  . Essential hypertension 12/25/2013  . BACK PAIN 08/02/2010    Harrel Carina, MS, OTR/L 04/30/2019, 6:53 PM  [place] MAIN [facility] SERVICES [address] [place], [place], [address] Phone: [phone]   Fax:  [phone]  Name: Paula Luna MRN: [id number] Date of Birth: 1925-02-14

## 2019-05-06 ENCOUNTER — Other Ambulatory Visit: Payer: Self-pay

## 2019-05-06 ENCOUNTER — Telehealth: Payer: Self-pay

## 2019-05-06 ENCOUNTER — Ambulatory Visit: Payer: PPO

## 2019-05-06 DIAGNOSIS — R262 Difficulty in walking, not elsewhere classified: Secondary | ICD-10-CM | POA: Diagnosis not present

## 2019-05-06 DIAGNOSIS — M6281 Muscle weakness (generalized): Secondary | ICD-10-CM

## 2019-05-06 DIAGNOSIS — H547 Unspecified visual loss: Secondary | ICD-10-CM

## 2019-05-06 NOTE — Telephone Encounter (Signed)
Appointment  8/25

## 2019-05-06 NOTE — Therapy (Signed)
[facility] [address] [place], [place], [address] Phone: [phone]   Fax:  [phone]  Physical Therapy Treatment  Patient Details  Name: Paula Luna MRN: [id number] Date of Birth: 1925/04/03 No data recorded  Encounter Date: 05/06/2019  PT End of Session - 05/06/19 1659    Visit Number  61    Number of Visits  61    Date for PT Re-Evaluation  05/06/19    Authorization Type  FOTO    Authorization Time Period  6/10    PT Start Time  1645    PT Stop Time  1730    PT Time Calculation (min)  45 min    Equipment Utilized During Treatment  Gait belt    Activity Tolerance  Patient tolerated treatment well    Behavior During Therapy  WFL for tasks assessed/performed       Past Medical History:  Diagnosis Date  . Anemia   . Back pain   . Hypertension 12/25/2013  . Ischemic bowel disease (HCC)    a. 03/2017 abd pain-->internal herina and ischemic jejunum s/p ex lap and small bowel resection.  . Macular degeneration of left eye 01/06/2015  . Osteoporosis   . Persistent atrial fibrillation    a. First noted 03/2017;  b. 07/2017 Echo: EF 60-65%, no rwma, mild MR, mildly to mod dil LA; c. 07/2017 48hr Holter: persistent Afib, avg HR of 66 (range 39-140 bpm);  d. CHA2DS2VASc = 4-->Xarelto initiated 07/2017.    Past Surgical History:  Procedure Laterality Date  . CLOSED REDUCTION PROXIMAL HUMERUS FRACTURE Left 2017  . LAPAROSCOPIC SMALL BOWEL RESECTION    . LAPAROTOMY N/A 03/18/2017   Procedure: EXPLORATORY LAPAROTOMY, SMALL BOWEL RESECTION;  Surgeon: Leafy RoPabon, Diego F, MD;  Location: [facility] ORS;  Service: General;  Laterality: N/A;  . TOTAL ABDOMINAL HYSTERECTOMY      There were no vitals filed for this visit.  Subjective Assessment - 05/06/19 1648    Subjective  Patient reports no significant changes since the previous session. Patient states she went to OT for her eye sight and states it was helpful.    Pertinent  History  Small bowel surgery     Limitations  Standing;Walking    Patient Stated Goals  To walk without feeling of falling     Currently in Pain?  No/denies       TREATMENT Therapeutic Exercise Side stepping on airex pad - x 10 with intermittent UE support with stepping up and over 6" step Nustep to improve strength and endurance in the UE/LE level 5 - x416min  Soccer kicks in standing - x 12 with changing LEs Step ups with unilateral UE support - x 12 B  Hip abduction in standing with UE support -  x 15 with RTB Hip extension in standing with UE support -  x 15 with RTB Hip circles in standing with UE support - x 15 with RTB Walking with focus on improving arm swing - x 15   Pt most challenged by balance activities this session, with fatigue pt had difficulty maintaining upright posture/exercise technique   PT Education - 05/06/19 1653    Education provided  Yes    Education Details  form/technique with exercise    Person(s) Educated  Patient    Methods  Explanation;Demonstration    Comprehension  Verbalized understanding;Returned demonstration          PT Long Term Goals - 03/25/19 1639  PT LONG TERM GOAL #1   Title  Patient will be independent with HEP focused on decreasing fall risk to continue benefits of therapy after discharge.     Baseline  Dependent with exercise performance; 01/01/2018: moderate cueing for performance. 03/28/18 min-mod cueing required; 04/30/18: min cueing required; 09/24/2018: min cueing required; 11/12/2018: Patient performs exercise with little to no cueing.     Time  6    Period  Weeks    Status  Achieved      PT LONG TERM GOAL #2   Title  Patient will improve TUG to under 12 sec to indicate singificant decrease in fall risk.     Baseline  20sec; 01/01/2018: 14sec; 02/12/2018; 11.59 sec;    Time  8    Period  Weeks    Status  Achieved      PT LONG TERM GOAL #3   Title  Patient will improve her ability to perform 72mWT to 47m/s to indicate  increased safety with walking at community ambulation    Baseline  .44m/s; 01/01/2018: .13m/s; 02/12/2018: .72m/s; 03/28/18 .94 m/s; 8/19/9: testing of goals deferred to next session; 06/28/2018: .61m/s; 09/24/2018: deferred; 10/08/2018: .85 m/s; 11/12/2018:  .89 m/s; 02/05/2019: .67m/s; 03/25/2019: .58m/s    Time  8    Period  Weeks    Status  On-going      PT LONG TERM GOAL #4   Title  Patient will improve 5XSTS to under 16 sec to indicate significant improvement LE functional strength and decrease fall risk.    Baseline  30sec; 01/01/2018: 12 sec    Time  8    Period  Weeks    Status  Achieved      PT LONG TERM GOAL #5   Title  Pt will ambulate 585ft in 3 minutes to demonstrate improved endurance with walking into department stores from the parking lot.     Baseline  03/28/18: 335ft; 04/30/18: testing of goals deferred to next session; 06/28/2018: 211ft; 09/24/2018: deferred; 10/08/2018: 241ft; 11/12/2018: 530ft with rollator; 02/04/2019: 538ft    Time  6    Period  Weeks    Status  Achieved      Additional Long Term Goals   Additional Long Term Goals  Yes      PT LONG TERM GOAL #6   Title  Patient will be able to perform >1017ft in a 85minWT with a rollator to decrease fall risk with ambulating at community distances.    Baseline  876ft with rollator    Time  6    Period  Weeks    Status  New    Target Date  05/06/19            Plan - 05/06/19 1748    Clinical Impression Statement  Patient demonstrates improvement with exercises with ability to perform hip abduction and extension in standing with a RTB compared to previous sessions. Patient demonstrates insteadiness on feet reporting she feels 'dizzy' and attributes this to wearing a mask but does not has symptoms with UE support. This is most likely secondary to decreased confidence in standing and patient maintaining forward flexed position throughout the motion. Patient will benefit from further skilled therapy to return to prior level of  function.    Rehab Potential  Fair    Clinical Impairments Affecting Rehab Potential  (+) family support, decreased coormorbities (-) age    PT Frequency  1x / week    PT Duration  6 weeks  PT Treatment/Interventions  Balance training;Neuromuscular re-education;Gait training;Patient/family education;Therapeutic exercise;Therapeutic activities;Functional mobility training;Stair training;Moist Heat;Aquatic Therapy;Electrical Stimulation;Cryotherapy;Manual techniques    PT Next Visit Plan  Attempt more dynamic balance tasks, ambulation over obstacles, ambulation with head turns, stepping response tasks    PT Home Exercise Plan  See education    Consulted and Agree with Plan of Care  Patient    Family Member Consulted  daughter       Patient will benefit from skilled therapeutic intervention in order to improve the following deficits and impairments:  Abnormal gait, Decreased coordination, Decreased endurance, Decreased activity tolerance, Decreased strength, Decreased range of motion, Decreased balance, Difficulty walking  Visit Diagnosis: Visual impairment  Difficulty in walking, not elsewhere classified  Muscle weakness (generalized)     Problem List Patient Active Problem List   Diagnosis Date Noted  . Atrial fibrillation (HCC) 04/26/2017  . Anemia 04/26/2017  . 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 Macular degeneration of left eye 01/06/2015  . Essential hypertension 12/25/2013  . BACK PAIN 08/02/2010    Myrene GalasWesley Li Bobo, PT DPT 05/06/2019, 5:55 PM  [facility] [address] [place], [place], [address] Phone: [phone]   Fax:  [phone]  Name: Paula Luna MRN: [id number] Date of Birth: 03/10/1925

## 2019-05-06 NOTE — Telephone Encounter (Signed)
Paula Luna (DPR signed) left v/m requesting cb to schedule flu shot.

## 2019-05-07 ENCOUNTER — Ambulatory Visit (INDEPENDENT_AMBULATORY_CARE_PROVIDER_SITE_OTHER): Payer: PPO

## 2019-05-07 DIAGNOSIS — Z23 Encounter for immunization: Secondary | ICD-10-CM

## 2019-05-10 ENCOUNTER — Telehealth: Payer: Self-pay | Admitting: Internal Medicine

## 2019-05-10 NOTE — Telephone Encounter (Signed)
Patients "friend" went to pick up Xarelto medication and the pharmacy was only able to give 1 month at a higher price. Patient has also had a drarastic income decline and is curious to find out if there is any assistance program for her Xarelto. Please advise

## 2019-05-13 ENCOUNTER — Other Ambulatory Visit: Payer: Self-pay

## 2019-05-13 ENCOUNTER — Ambulatory Visit: Payer: PPO

## 2019-05-13 DIAGNOSIS — R262 Difficulty in walking, not elsewhere classified: Secondary | ICD-10-CM

## 2019-05-13 DIAGNOSIS — M6281 Muscle weakness (generalized): Secondary | ICD-10-CM

## 2019-05-13 NOTE — Telephone Encounter (Signed)
Spoke with [name], patient's daughter, ok per DPR. States she went to pick up patient's usual 90 day refill of Xarelto.  [facility] only had enough pills to give her 30 day supply for around $73.  Daughter called [facility] [employer] to see what the price would be now as patient only needs the remainder of the year of medication.  [facility] said patient has now reached the gap and would have to pay $130 for the last part of the 90 days. Previously, patient only paid around $90 for 90 day supply. She is frustrated and feels like patient was taken advantage of. She's going to call [facility] to see ab9out working it out.  Advised for them to start process with Wynetta Emery and [place] Patient assistance and see if patient qualifies.  Patient's disability stopped first of July. She would like samples if able. I advised I could not give 2 months of samples but may be able to in the interim while patient assistance goes through if absolutely necessary.  She was very appreciative and will call Xarelto.  She will call me back if there is anything further I need to assist with in the process.

## 2019-05-13 NOTE — Telephone Encounter (Signed)
Patient daughter is calling back, states she never received a call on Friday.

## 2019-05-13 NOTE — Therapy (Signed)
[facility] [address], [place], [address] Phone: [phone]   Fax:  [phone]  Physical Therapy Treatment  Patient Details  Name: Paula Luna MRN: [id number] Date of Birth: Mar 19, 1925 No data recorded  Encounter Date: 05/13/2019  PT End of Session - 05/13/19 1705    Visit Number  20    Number of Visits  76    Date for PT Re-Evaluation  05/06/19    Authorization Type  FOTO    Authorization Time Period  7/10    PT Start Time  1645    PT Stop Time  1730    PT Time Calculation (min)  45 min    Equipment Utilized During Treatment  Gait belt    Activity Tolerance  Patient tolerated treatment well    Behavior During Therapy  WFL for tasks assessed/performed       Past Medical History:  Diagnosis Date  . Anemia   . Back pain   . Hypertension 12/25/2013  . Ischemic bowel disease ([place])    a. 03/2017 abd pain-->internal herina and ischemic jejunum s/p ex lap and small bowel resection.  . Macular degeneration of left eye 01/06/2015  . Osteoporosis   . Persistent atrial fibrillation    a. First noted 03/2017;  b. 07/2017 Echo: EF 60-65%, no rwma, mild MR, mildly to mod dil LA; c. 07/2017 48hr Holter: persistent Afib, avg HR of 66 (range 39-140 bpm);  d. CHA2DS2VASc = 4-->Xarelto initiated 07/2017.    Past Surgical History:  Procedure Laterality Date  . CLOSED REDUCTION PROXIMAL HUMERUS FRACTURE Left 2017  . LAPAROSCOPIC SMALL BOWEL RESECTION    . LAPAROTOMY N/A 03/18/2017   Procedure: EXPLORATORY LAPAROTOMY, SMALL BOWEL RESECTION;  Surgeon: Jules Husbands, MD;  Location: [facility] ORS;  Service: General;  Laterality: N/A;  . TOTAL ABDOMINAL HYSTERECTOMY      There were no vitals filed for this visit.  Subjective Assessment - 05/13/19 1702    Subjective  Patient reports no major changes thoruhgout the session and states improvement overall with exercises. Patient states she went to OT and states it has been  going well.    Pertinent History  Small bowel surgery     Limitations  Standing;Walking    Patient Stated Goals  To walk without feeling of falling     Currently in Pain?  No/denies       TREATMENT Therapeutic Exercise Ambulation with focus on improving speed and stride length -- 1081ft with use of a rollator Walking with head turns up/down; left right -- x 31ft each direction Walking with change in speeds fast/slow -- x 61ft Stairs reciprocal with use of UE's B -- x4 Stepping over obstacle with walking -- x2 with use of UE Nustep to improve muscular/cardiovascular endurance -- x 7 min level 5  Performed exercises to improve musuclar strength and endurance; exercise to improve fall risk   PT Education - 05/13/19 1704    Education provided  Yes    Education Details  form/technique with exercise; POC    Person(s) Educated  Patient    Methods  Explanation;Demonstration    Comprehension  Verbalized understanding;Returned demonstration          PT Long Term Goals - 05/13/19 1709      PT LONG TERM GOAL #1   Title  Patient will be independent with HEP focused on decreasing fall risk to continue benefits of therapy after discharge.  Baseline  Dependent with exercise performance; 01/01/2018: moderate cueing for performance. 03/28/18 min-mod cueing required; 04/30/18: min cueing required; 09/24/2018: min cueing required; 11/12/2018: Patient performs exercise with little to no cueing.     Time  6    Period  Weeks    Status  Achieved      PT LONG TERM GOAL #2   Title  Patient will improve TUG to under 12 sec to indicate singificant decrease in fall risk.     Baseline  20sec; 01/01/2018: 14sec; 02/12/2018; 11.59 sec;    Time  8    Period  Weeks    Status  Achieved      PT LONG TERM GOAL #3   Title  Patient will improve her ability to perform to 72m/s to indicate increased safety with walking at community ambulation    Baseline  .44m/s; 01/01/2018: .81m/s; 02/12/2018: .62m/s; 03/28/18  .94 m/s; 8/19/9: testing of goals deferred to next session; 06/28/2018: .31m/s; 09/24/2018: deferred; 10/08/2018: .85 m/s; 11/12/2018:  .89 m/s; 02/05/2019: .37m/s; 03/25/2019: .31m/s 05/13/2019: 1.56m/s    Time  8    Period  Weeks    Status  Achieved      PT LONG TERM GOAL #4   Title  Patient will improve 5XSTS to under 16 sec to indicate significant improvement LE functional strength and decrease fall risk.    Baseline  30sec; 01/01/2018: 12 sec    Time  8    Period  Weeks    Status  Achieved      PT LONG TERM GOAL #5   Title  Pt will ambulate 575ft in 3 minutes to demonstrate improved endurance with walking into department stores from the parking lot.     Baseline  03/28/18: 329ft; 04/30/18: testing of goals deferred to next session; 06/28/2018: 273ft; 09/24/2018: deferred; 10/08/2018: 232ft; 11/12/2018: 542ft with rollator; 02/04/2019: 570ft    Time  6    Period  Weeks    Status  Achieved      Additional Long Term Goals   Additional Long Term Goals  Yes      PT LONG TERM GOAL #6   Title  Patient will be able to perform >1038ft in a with a rollator to decrease fall risk with ambulating at community distances.    Baseline  887ft with rollator; 05/13/2019: 1022ft    Time  6    Period  Weeks    Status  Achieved      PT LONG TERM GOAL #7   Title  Patient will improve DGI by 3 points to indicate singificant improvement in dynamic balance and decrease fall risk.    Baseline  05/13/2019:15/24    Time  6    Period  Weeks    Status  New            Plan - 05/13/19 1741    Clinical Impression Statement  Patient has made significnat progress tards long term goals with increase in of 261ft, and a gait speed of >1 m/s. Although patient is improving, she continues to have difficulty with performing standing without UE support, most notably, with ambulating which is further indicated by a decreased DGI score. Patient is making progress, but continues to demonstrates increased fall risk  most notably without the use of an AD which is how she ambulates at home. Patient will benefit from further skilled therapy to return to prior level of function.    Rehab Potential  Fair  Clinical Impairments Affecting Rehab Potential  (+) family support, decreased coormorbities (-) age    PT Frequency  1x / week    PT Duration  6 weeks    PT Treatment/Interventions  Balance training;Neuromuscular re-education;Gait training;Patient/family education;Therapeutic exercise;Therapeutic activities;Functional mobility training;Stair training;Moist Heat;Aquatic Therapy;Electrical Stimulation;Cryotherapy;Manual techniques    PT Next Visit Plan  Attempt more dynamic balance tasks, ambulation over obstacles, ambulation with head turns, stepping response tasks    PT Home Exercise Plan  See education    Consulted and Agree with Plan of Care  Patient    Family Member Consulted  daughter       Patient will benefit from skilled therapeutic intervention in order to improve the following deficits and impairments:  Abnormal gait, Decreased coordination, Decreased endurance, Decreased activity tolerance, Decreased strength, Decreased range of motion, Decreased balance, Difficulty walking  Visit Diagnosis: Difficulty in walking, not elsewhere classified  Muscle weakness (generalized)     Problem List Patient Active Problem List   Diagnosis Date Noted  . Atrial fibrillation (HCC) 04/26/2017  . Anemia 04/26/2017  . 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 Macular degeneration of left eye 01/06/2015  . Essential hypertension 12/25/2013  . BACK PAIN 08/02/2010    Myrene GalasWesley Jamarie Mussa, PT DPT 05/13/2019, 5:49 PM  [facility] [address] [place], [place], [address] Phone: [phone]   Fax:  [phone]  Name: Paula Luna MRN: [id number] Date of Birth: [birth year]

## 2019-05-15 ENCOUNTER — Other Ambulatory Visit: Payer: Self-pay | Admitting: Family Medicine

## 2019-05-15 ENCOUNTER — Other Ambulatory Visit: Payer: Self-pay

## 2019-05-15 MED ORDER — RIVAROXABAN 20 MG PO TABS: 20.0000 mg | ORAL_TABLET | Freq: Every day | ORAL | 6 refills | Status: DC

## 2019-05-21 ENCOUNTER — Ambulatory Visit: Payer: PPO | Attending: Family Medicine

## 2019-05-21 ENCOUNTER — Other Ambulatory Visit: Payer: Self-pay

## 2019-05-21 DIAGNOSIS — R262 Difficulty in walking, not elsewhere classified: Secondary | ICD-10-CM | POA: Diagnosis not present

## 2019-05-21 DIAGNOSIS — H547 Unspecified visual loss: Secondary | ICD-10-CM | POA: Insufficient documentation

## 2019-05-21 DIAGNOSIS — M6281 Muscle weakness (generalized): Secondary | ICD-10-CM | POA: Diagnosis not present

## 2019-05-22 NOTE — Therapy (Signed)
[facility] PH[facility] MEDICINE [address], [place], [address] Phone: [phone]   Fax:  [phone]  Physical Therapy Treatment  Patient Details  Name: Paula Luna MRN: [id number] Date of Birth: 06-08-1925 No data recorded  Encounter Date: 05/21/2019  PT End of Session - 05/22/19 1623    Visit Number  63    Number of Visits  67    Date for PT Re-Evaluation  06/24/19    Authorization Type  FOTO    Authorization Time Period  2/10    PT Start Time  1615    PT Stop Time  1700    PT Time Calculation (min)  45 min    Equipment Utilized During Treatment  Gait belt    Activity Tolerance  Patient tolerated treatment well    Behavior During Therapy  WFL for tasks assessed/performed       Past Medical History:  Diagnosis Date  . Anemia   . Back pain   . Hypertension 12/25/2013  . Ischemic bowel disease (HCC)    a. 03/2017 abd pain-->internal herina and ischemic jejunum s/p ex lap and small bowel resection.  . Macular degeneration of left eye 01/06/2015  . Osteoporosis   . Persistent atrial fibrillation    a. First noted 03/2017;  b. 07/2017 Echo: EF 60-65%, no rwma, mild MR, mildly to mod dil LA; c. 07/2017 48hr Holter: persistent Afib, avg HR of 66 (range 39-140 bpm);  d. CHA2DS2VASc = 4-->Xarelto initiated 07/2017.    Past Surgical History:  Procedure Laterality Date  . CLOSED REDUCTION PROXIMAL HUMERUS FRACTURE Left 2017  . LAPAROSCOPIC SMALL BOWEL RESECTION    . LAPAROTOMY N/A 03/18/2017   Procedure: EXPLORATORY LAPAROTOMY, SMALL BOWEL RESECTION;  Surgeon: Leafy Ro, MD;  Location: [facility] ORS;  Service: General;  Laterality: N/A;  . TOTAL ABDOMINAL HYSTERECTOMY      There were no vitals filed for this visit.  Subjective Assessment - 05/21/19 1645    Subjective  Patient states she is feeling tired today and that she's been in the car for the past couple of hours and is feeling okay currently.    Pertinent History   Small bowel surgery     Limitations  Standing;Walking    Patient Stated Goals  To walk without feeling of falling     Currently in Pain?  No/denies          TREATMENT Therapeutic Exercise Nustep in sitting to improve -- level 1 with UE support Walking forward without UE support -- x 200 ft to improve ability to perform arm swing  Walking backward without UE support -- x 200 ft to improve ability to improve balance Square stepping without UE support -- x 20 without UE support   Performed exercises to improve balance and decrease fall risk.   PT Education - 05/22/19 1619    Education provided  Yes    Education Details  form/technique with exercise    Person(s) Educated  Patient    Methods  Explanation;Demonstration    Comprehension  Verbalized understanding;Returned demonstration          PT Long Term Goals - 05/13/19 1709      PT LONG TERM GOAL #1   Title  Patient will be independent with HEP focused on decreasing fall risk to continue benefits of therapy after discharge.     Baseline  Dependent with exercise performance; 01/01/2018: moderate cueing for performance. 03/28/18 min-mod cueing required; 04/30/18:  min cueing required; 09/24/2018: min cueing required; 11/12/2018: Patient performs exercise with little to no cueing.     Time  6    Period  Weeks    Status  Achieved      PT LONG TERM GOAL #2   Title  Patient will improve TUG to under 12 sec to indicate singificant decrease in fall risk.     Baseline  20sec; 01/01/2018: 14sec; 02/12/2018; 11.59 sec;    Time  8    Period  Weeks    Status  Achieved      PT LONG TERM GOAL #3   Title  Patient will improve her ability to perform 27mWT to 41m/s to indicate increased safety with walking at community ambulation    Baseline  .19m/s; 01/01/2018: .8m/s; 02/12/2018: .46m/s; 03/28/18 .94 m/s; 8/19/9: testing of goals deferred to next session; 06/28/2018: .85m/s; 09/24/2018: deferred; 10/08/2018: .85 m/s; 11/12/2018:  .89 m/s; 02/05/2019:  .64m/s; 03/25/2019: .54m/s 05/13/2019: 1.45m/s    Time  8    Period  Weeks    Status  Achieved      PT LONG TERM GOAL #4   Title  Patient will improve 5XSTS to under 16 sec to indicate significant improvement LE functional strength and decrease fall risk.    Baseline  30sec; 01/01/2018: 12 sec    Time  8    Period  Weeks    Status  Achieved      PT LONG TERM GOAL #5   Title  Pt will ambulate 527ft in 3 minutes to demonstrate improved endurance with walking into department stores from the parking lot.     Baseline  03/28/18: 367ft; 04/30/18: testing of goals deferred to next session; 06/28/2018: 270ft; 09/24/2018: deferred; 10/08/2018: 266ft; 11/12/2018: 566ft with rollator; 02/04/2019: 569ft    Time  6    Period  Weeks    Status  Achieved      Additional Long Term Goals   Additional Long Term Goals  Yes      PT LONG TERM GOAL #6   Title  Patient will be able to perform >1013ft in a 21minWT with a rollator to decrease fall risk with ambulating at community distances.    Baseline  877ft with rollator; 05/13/2019: 1067ft    Time  6    Period  Weeks    Status  Achieved      PT LONG TERM GOAL #7   Title  Patient will improve DGI by 3 points to indicate singificant improvement in dynamic balance and decrease fall risk.    Baseline  05/13/2019:15/24    Time  6    Period  Weeks    Status  New            Plan - 05/22/19 1628    Clinical Impression Statement  Patient demonstrates increased BP today in sitting and performed lower intensity exercise as to not further increase her BP. Patient demonstrates increased onset of fatigue with exercise requiring greater amount of rest breaks compared to previous sessions.BP range 190/80-160/1mmHg during session. Patient demonstrates no increase in syncope, dizziness, or light-headedness during today's session. Patient will benefit from further skilled therapy to return to prior level of function.    Rehab Potential  Fair    Clinical Impairments  Affecting Rehab Potential  (+) family support, decreased coormorbities (-) age    PT Frequency  1x / week    PT Duration  6 weeks    PT Treatment/Interventions  Balance training;Neuromuscular re-education;Gait  training;Patient/family education;Therapeutic exercise;Therapeutic activities;Functional mobility training;Stair training;Moist Heat;Aquatic Therapy;Electrical Stimulation;Cryotherapy;Manual techniques    PT Next Visit Plan  Attempt more dynamic balance tasks, ambulation over obstacles, ambulation with head turns, stepping response tasks    PT Home Exercise Plan  See education    Consulted and Agree with Plan of Care  Patient    Family Member Consulted  daughter       Patient will benefit from skilled therapeutic intervention in order to improve the following deficits and impairments:  Abnormal gait, Decreased coordination, Decreased endurance, Decreased activity tolerance, Decreased strength, Decreased range of motion, Decreased balance, Difficulty walking  Visit Diagnosis: Difficulty in walking, not elsewhere classified  Muscle weakness (generalized)     Problem List Patient Active Problem List   Diagnosis Date Noted  . Atrial fibrillation (HCC) 04/26/2017  . Anemia 04/26/2017  . 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 Macular degeneration of left eye 01/06/2015  . Essential hypertension 12/25/2013  . BACK PAIN 08/02/2010    Myrene GalasWesley Denece Shearer, PT DPT 05/22/2019, 6:03 PM  [facility] [address] [place], [place], [address] Phone: [phone]   Fax:  [phone]  Name: Paula Luna MRN: [id number] Date of Birth: 08-22-1925

## 2019-05-28 ENCOUNTER — Ambulatory Visit: Payer: PPO

## 2019-05-28 ENCOUNTER — Other Ambulatory Visit: Payer: Self-pay

## 2019-05-28 DIAGNOSIS — R262 Difficulty in walking, not elsewhere classified: Secondary | ICD-10-CM

## 2019-05-28 DIAGNOSIS — M6281 Muscle weakness (generalized): Secondary | ICD-10-CM

## 2019-05-29 ENCOUNTER — Ambulatory Visit: Payer: PPO | Admitting: Occupational Therapy

## 2019-05-29 ENCOUNTER — Encounter: Payer: Self-pay | Admitting: Occupational Therapy

## 2019-05-29 DIAGNOSIS — R262 Difficulty in walking, not elsewhere classified: Secondary | ICD-10-CM | POA: Diagnosis not present

## 2019-05-29 DIAGNOSIS — H547 Unspecified visual loss: Secondary | ICD-10-CM

## 2019-05-29 NOTE — Therapy (Signed)
[facility] [address] [place], [place], [address] Phone: [phone]   Fax:  [phone]  Physical Therapy Treatment  Patient Details  Name: Paula Luna MRN: [id number] Date of Birth: 10/04/1924 No data recorded  Encounter Date: 05/28/2019  PT End of Session - 05/28/19 1649    Visit Number  64    Number of Visits  67    Date for PT Re-Evaluation  06/24/19    Authorization Type  FOTO    Authorization Time Period  3/10    PT Start Time  1615    PT Stop Time  1700    PT Time Calculation (min)  45 min    Equipment Utilized During Treatment  Gait belt    Activity Tolerance  Patient tolerated treatment well    Behavior During Therapy  WFL for tasks assessed/performed       Past Medical History:  Diagnosis Date  . Anemia   . Back pain   . Hypertension 12/25/2013  . Ischemic bowel disease (HCC)    a. 03/2017 abd pain-->internal herina and ischemic jejunum s/p ex lap and small bowel resection.  . Macular degeneration of left eye 01/06/2015  . Osteoporosis   . Persistent atrial fibrillation    a. First noted 03/2017;  b. 07/2017 Echo: EF 60-65%, no rwma, mild MR, mildly to mod dil LA; c. 07/2017 48hr Holter: persistent Afib, avg HR of 66 (range 39-140 bpm);  d. CHA2DS2VASc = 4-->Xarelto initiated 07/2017.    Past Surgical History:  Procedure Laterality Date  . CLOSED REDUCTION PROXIMAL HUMERUS FRACTURE Left 2017  . LAPAROSCOPIC SMALL BOWEL RESECTION    . LAPAROTOMY N/A 03/18/2017   Procedure: EXPLORATORY LAPAROTOMY, SMALL BOWEL RESECTION;  Surgeon: Leafy RoPabon, Diego F, MD;  Location: [facility] ORS;  Service: General;  Laterality: N/A;  . TOTAL ABDOMINAL HYSTERECTOMY      There were no vitals filed for this visit.  Subjective Assessment - 05/28/19 1647    Subjective  Patient reports no major changes since the previous session. Patient states she's been walking around her home.    Pertinent History  Small bowel surgery      Limitations  Standing;Walking    Patient Stated Goals  To walk without feeling of falling     Currently in Pain?  No/denies           TREATMENT BP: 18150mmHg/78mmHg Therapeutic Exercise Nustep performed for 5 min to improve strength and endurance - x 20 [place] in standing - x 20 steps performed in standing Swing in standing - x 20 steps performed in standing [place] in standing - x 20 steps performed in standing Amb with rollator - x 5 min in standing  Performed exercises in standing to address balance concerns and improve standing strength and balance   PT Education - 05/28/19 1648    Education provided  Yes    Education Details  form/technique with exercise    Person(s) Educated  Patient    Methods  Explanation;Demonstration    Comprehension  Verbalized understanding;Returned demonstration          PT Long Term Goals - 05/13/19 1709      PT LONG TERM GOAL #1   Title  Patient will be independent with HEP focused on decreasing fall risk to continue benefits of therapy after discharge.     Baseline  Dependent with exercise performance; 01/01/2018: moderate cueing for performance. 03/28/18 min-mod cueing required; 04/30/18: min cueing required; 09/24/2018:  min cueing required; 11/12/2018: Patient performs exercise with little to no cueing.     Time  6    Period  Weeks    Status  Achieved      PT LONG TERM GOAL #2   Title  Patient will improve TUG to under 12 sec to indicate singificant decrease in fall risk.     Baseline  20sec; 01/01/2018: 14sec; 02/12/2018; 11.59 sec;    Time  8    Period  Weeks    Status  Achieved      PT LONG TERM GOAL #3   Title  Patient will improve her ability to perform 33mWT to 31m/s to indicate increased safety with walking at community ambulation    Baseline  .105m/s; 01/01/2018: .63m/s; 02/12/2018: .19m/s; 03/28/18 .94 m/s; 8/19/9: testing of goals deferred to next session; 06/28/2018: .65m/s; 09/24/2018: deferred; 10/08/2018: .85 m/s; 11/12/2018:  .89  m/s; 02/05/2019: .41m/s; 03/25/2019: .46m/s 05/13/2019: 1.25m/s    Time  8    Period  Weeks    Status  Achieved      PT LONG TERM GOAL #4   Title  Patient will improve 5XSTS to under 16 sec to indicate significant improvement LE functional strength and decrease fall risk.    Baseline  30sec; 01/01/2018: 12 sec    Time  8    Period  Weeks    Status  Achieved      PT LONG TERM GOAL #5   Title  Pt will ambulate 519ft in 3 minutes to demonstrate improved endurance with walking into department stores from the parking lot.     Baseline  03/28/18: 368ft; 04/30/18: testing of goals deferred to next session; 06/28/2018: 287ft; 09/24/2018: deferred; 10/08/2018: 252ft; 11/12/2018: 562ft with rollator; 02/04/2019: 565ft    Time  6    Period  Weeks    Status  Achieved      Additional Long Term Goals   Additional Long Term Goals  Yes      PT LONG TERM GOAL #6   Title  Patient will be able to perform >1099ft in a 21minWT with a rollator to decrease fall risk with ambulating at community distances.    Baseline  858ft with rollator; 05/13/2019: 1072ft    Time  6    Period  Weeks    Status  Achieved      PT LONG TERM GOAL #7   Title  Patient will improve DGI by 3 points to indicate singificant improvement in dynamic balance and decrease fall risk.    Baseline  05/13/2019:15/24    Time  6    Period  Weeks    Status  New            Plan - 05/28/19 1651    Clinical Impression Statement  Addressed dynamic balance today during today's session using dancing techniques to improve weight shifting and wegith acceptance with movement of her UE and LEs. Also addressed limitations of fear with movement which often limits her ability to perform exercises in standing. Patient is improving overall and will benefit from further skilled therapy to return to prior level of function    Rehab Potential  Fair    Clinical Impairments Affecting Rehab Potential  (+) family support, decreased coormorbities (-) age    PT  Frequency  1x / week    PT Duration  6 weeks    PT Treatment/Interventions  Balance training;Neuromuscular re-education;Gait training;Patient/family education;Therapeutic exercise;Therapeutic activities;Functional mobility training;Stair training;Moist Heat;Aquatic Therapy;Electrical Stimulation;Cryotherapy;Manual techniques  PT Next Visit Plan  Attempt more dynamic balance tasks, ambulation over obstacles, ambulation with head turns, stepping response tasks    PT Home Exercise Plan  See education    Consulted and Agree with Plan of Care  Patient    Family Member Consulted  daughter       Patient will benefit from skilled therapeutic intervention in order to improve the following deficits and impairments:  Abnormal gait, Decreased coordination, Decreased endurance, Decreased activity tolerance, Decreased strength, Decreased range of motion, Decreased balance, Difficulty walking  Visit Diagnosis: Difficulty in walking, not elsewhere classified  Muscle weakness (generalized)     Problem List Patient Active Problem List   Diagnosis Date Noted  . Atrial fibrillation (HCC) 04/26/2017  . Anemia 04/26/2017  . 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 Macular degeneration of left eye 01/06/2015  . Essential hypertension 12/25/2013  . BACK PAIN 08/02/2010    Myrene Galas, PT DPT 05/29/2019, 10:29 AM  [facility] PHYSICAL AND SPORTS MEDICINE [address], [place], [address] Phone: [phone]   Fax:  [phone]  Name: Paula Luna MRN: [id number] Date of Birth: October 18, 1924

## 2019-05-29 NOTE — Therapy (Signed)
[facility] SERVICES [address] [place], [place], [address] Phone: [phone]   Fax:  [phone]  Occupational Therapy Treatment  Patient Details  Name: Paula Luna MRN: [id number] Date of Birth: 1925/05/12 No data recorded  Encounter Date: 05/29/2019  OT End of Session - 05/29/19 1821    Visit Number  2    Number of Visits  6    Date for OT Re-Evaluation  06/11/19    OT Start Time  1519    OT Stop Time  1605    OT Time Calculation (min)  46 min    Activity Tolerance  Patient tolerated treatment well    Behavior During Therapy  [facility] for tasks assessed/performed       Past Medical History:  Diagnosis Date  . Anemia   . Back pain   . Hypertension 12/25/2013  . Ischemic bowel disease (HCC)    a. 03/2017 abd pain-->internal herina and ischemic jejunum s/p ex lap and small bowel resection.  . Macular degeneration of left eye 01/06/2015  . Osteoporosis   . Persistent atrial fibrillation    a. First noted 03/2017;  b. 07/2017 Echo: EF 60-65%, no rwma, mild MR, mildly to mod dil LA; c. 07/2017 48hr Holter: persistent Afib, avg HR of 66 (range 39-140 bpm);  d. CHA2DS2VASc = 4-->Xarelto initiated 07/2017.    Past Surgical History:  Procedure Laterality Date  . CLOSED REDUCTION PROXIMAL HUMERUS FRACTURE Left 2017  . LAPAROSCOPIC SMALL BOWEL RESECTION    . LAPAROTOMY N/A 03/18/2017   Procedure: EXPLORATORY LAPAROTOMY, SMALL BOWEL RESECTION;  Surgeon: Leafy Ro, MD;  Location: [facility] ORS;  Service: General;  Laterality: N/A;  . TOTAL ABDOMINAL HYSTERECTOMY      There were no vitals filed for this visit.  Subjective Assessment - 05/29/19 1820    Subjective   Pt. was present with her daughter    Patient is accompanied by:  Family member    Pertinent History  Pt. is a 85 y.o. female who has been diagnosed with Macular Degeneration. Pt. PMHx includes: Essential HTN, IBS, A-Fib, and Anemia. Pt. resides with her daughter, is  retired, and enjoys watching her great granddaughter's soccer games, and watching TV.    Patient Stated Goals  To find strategies to help make daily tasks easier with her vision.    Currently in Pain?  No/denies      OT TREATMENT    Selfcare:  Pt. education was provided about visual compensatory strategies for home management tasks, and strategies for success in the kitchen. Pt. education was provided about organization of items in cabinets, and appliances for more accessibility, opportunities for high contrast, and adaptive devices to improve safety in the kitchen, and during meal preparation. Portions of the BIVABA Basic Visual Function Assessment was administered to the patient to help determine how visual impairments may be affecting ADL, and IADL functioning. Pt. wears glasses  Eye Dominance: Left Eye used to observe flower design. Acuity for Intermediate Disistance: Right Eye (OD):  Snellen: 20/200 Metric: 1/10 Left Eye (OS): Pt. Unable to complete Both Eyes (OU): Snellen: 20/125  Metric: 1/6 Contrast Sensitivity Function using low contrast flip chart with Lea-numbers Recognizes numbers only at the 25% level. Visual field assessments were unable to be completed as pt. had difficulty following the directions to accurately complete.  Response to treatment:   Pt. has implemented some visual compenstaory strategies at home, and is now able to access her telephone using some  high contrast locator dots. Pt.'s daughter has assisted pt. with organizing her closet with matching shirts, blouses, and slacks/shorts close together. Pt. has her cabinets organized, with a system to put the dishes away that works well for them. Pt. continues to present with low vision which is limiting her ability to complete daily ADL, and IADL tasks efficiently. Pt. has difficulty with cooking, and identifying when things are boiling over. Pt. contrast sensivitiy limited which impacts safety, and function. Pt. continues to  benefit from skilled OT services for education about visual compensatory strategies for improved safety during ADLs, and IADLs.                             OT Long Term Goals - 04/30/19 1829      OT LONG TERM GOAL #1   Title  Pt. will identify, and utilize visual compensatory strategies for basic ADL care tasks 100% of the time.    Baseline  Pt. is limited by vision    Time  6    Period  Weeks    Status  New    Target Date  06/11/19      OT LONG TERM GOAL #2   Title  Pt. will identify, and utilize visual compensatory strategies for IADL, and kitchen related tasks 100% of the time to promote safety, and independence.    Baseline  Pt. is limited by vision    Time  6    Period  Weeks    Status  New            Plan - 05/29/19 1821    Clinical Impression Statement  Pt. has implemented some visual compenstaory strategies at home, and is now able to access her telephone using some high contrast locator dots. Pt.'s daughter has assisted pt. with organizing her closet with matching shirts, blouses, and slacks/shorts close together. Pt. has her cabinets organized, with a system to put the dishes away that works well for them. Pt. continues to present with low vision which is limiting her ability to complete daily ADL, and IADL tasks efficiently. Pt. has difficulty with cooking, and identifying when things are boiling over. Pt. contrast sensivitiy limited which impacts safety, and function. Pt. continues to benefit from skilled OT services for education about visual compensatory strategies for improved safety during ADLs, and IADLs.    OT Occupational Profile and History  Detailed Assessment- Review of Records and additional review of physical, cognitive, psychosocial history related to current functional performance    Occupational performance deficits (Please refer to evaluation for details):  IADL's;ADL's    Body Structure / Function / Physical Skills  ADL;IADL     Rehab Potential  Good    Clinical Decision Making  Several treatment options, min-mod task modification necessary    Modification or Assistance to Complete Evaluation   Min-Moderate modification of tasks or assist with assess necessary to complete eval    OT Frequency  1x / week    OT Duration  6 weeks    OT Treatment/Interventions  Self-care/ADL training;Visual/perceptual remediation/compensation;Therapeutic activities;DME and/or AE instruction;Patient/family education    Consulted and Agree with Plan of Care  Patient;Family member/caregiver    Family Member Consulted  Daughter: Dewayne Hatchnn       Patient will benefit from skilled therapeutic intervention in order to improve the following deficits and impairments:   Body Structure / Function / Physical Skills: ADL, IADL  Visit Diagnosis: Visual impairment    Problem List Patient Active Problem List   Diagnosis Date Noted  . Atrial fibrillation ([place]) 04/26/2017  . Anemia 04/26/2017  . Ischemic bowel disease ([place]) 03/18/2017  . Ischemic necrosis of small bowel ([place])   . Macular degeneration of left eye 01/06/2015  . Essential hypertension 12/25/2013  . BACK PAIN 08/02/2010    Harrel Carina, MS, OTR/L 05/29/2019, 6:36 PM  [place] MAIN [facility] SERVICES [address] [place], [place], [address] Phone: [phone]   Fax:  [phone]  Name: DONIE MOULTON MRN: [id number] Date of Birth: Mar 22, 1925

## 2019-06-04 ENCOUNTER — Ambulatory Visit: Payer: PPO

## 2019-06-04 ENCOUNTER — Other Ambulatory Visit: Payer: Self-pay

## 2019-06-04 DIAGNOSIS — R262 Difficulty in walking, not elsewhere classified: Secondary | ICD-10-CM | POA: Diagnosis not present

## 2019-06-04 DIAGNOSIS — M6281 Muscle weakness (generalized): Secondary | ICD-10-CM

## 2019-06-04 NOTE — Therapy (Signed)
[facility] [address], [place], [address] Phone: [phone]   Fax:  [phone]  Physical Therapy Treatment  Patient Details  Name: Paula Luna MRN: [id number] Date of Birth: 08/08/1925 No data recorded  Encounter Date: 06/04/2019  PT End of Session - 06/04/19 1621    Visit Number  65    Number of Visits  67    Date for PT Re-Evaluation  06/24/19    Authorization Type  FOTO    Authorization Time Period  3/10    PT Start Time  1615    PT Stop Time  1700    PT Time Calculation (min)  45 min    Equipment Utilized During Treatment  Gait belt    Activity Tolerance  Patient tolerated treatment well    Behavior During Therapy  WFL for tasks assessed/performed       Past Medical History:  Diagnosis Date  . Anemia   . Back pain   . Hypertension 12/25/2013  . Ischemic bowel disease (HCC)    a. 03/2017 abd pain-->internal herina and ischemic jejunum s/p ex lap and small bowel resection.  . Macular degeneration of left eye 01/06/2015  . Osteoporosis   . Persistent atrial fibrillation    a. First noted 03/2017;  b. 07/2017 Echo: EF 60-65%, no rwma, mild MR, mildly to mod dil LA; c. 07/2017 48hr Holter: persistent Afib, avg HR of 66 (range 39-140 bpm);  d. CHA2DS2VASc = 4-->Xarelto initiated 07/2017.    Past Surgical History:  Procedure Laterality Date  . CLOSED REDUCTION PROXIMAL HUMERUS FRACTURE Left 2017  . LAPAROSCOPIC SMALL BOWEL RESECTION    . LAPAROTOMY N/A 03/18/2017   Procedure: EXPLORATORY LAPAROTOMY, SMALL BOWEL RESECTION;  Surgeon: Leafy Ro, MD;  Location: [facility] ORS;  Service: General;  Laterality: N/A;  . TOTAL ABDOMINAL HYSTERECTOMY      There were no vitals filed for this visit.  Subjective Assessment - 06/04/19 1618    Subjective  Patient reports pleasant weekend at park.    Pertinent History  Small bowel surgery     Limitations  Standing;Walking    Patient Stated Goals  To walk  without feeling of falling     Currently in Pain?  No/denies    Multiple Pain Sites  No          Unbilled Warmup on NuStep level 5 x 5 min  VITALS BP 162/85  Therex Sit <> stand x 10 without UE support. Verbal tactile cues for improved sequencing and extension phase and slowing tempo of eccentric for increased functional mobility and LE power. Sidestepping with BUE support faded to single UE support 6 x 20 feet for increased dynamic balance and hip strength for reduction of FOF and fall risk "Swing basic" sidestepping, weight shifts, backward steps x 4 min for increased dynamic balance and hip strength for reduction of FOF and fall risk "Can-can" alternating marches and kicks with single UE support x 2 min for increased dynamic balance and hip strength for reduction of FOF and fall risk Weight shifts with UE perturbations x 3 min for increased dynamic balance and hip strength for reduction of FOF and fall risk Strutting with faded UE support x 200 ft for increased dynamic balance and hip strength for reduction of FOF and fall risk       PT Education - 06/04/19 1620    Education provided  Yes    Education Details  form/technique with exercise  Person(s) Educated  Patient    Methods  Explanation;Demonstration    Comprehension  Verbalized understanding;Returned demonstration          PT Long Term Goals - 05/13/19 1709      PT LONG TERM GOAL #1   Title  Patient will be independent with HEP focused on decreasing fall risk to continue benefits of therapy after discharge.     Baseline  Dependent with exercise performance; 01/01/2018: moderate cueing for performance. 03/28/18 min-mod cueing required; 04/30/18: min cueing required; 09/24/2018: min cueing required; 11/12/2018: Patient performs exercise with little to no cueing.     Time  6    Period  Weeks    Status  Achieved      PT LONG TERM GOAL #2   Title  Patient will improve TUG to under 12 sec to indicate singificant  decrease in fall risk.     Baseline  20sec; 01/01/2018: 14sec; 02/12/2018; 11.59 sec;    Time  8    Period  Weeks    Status  Achieved      PT LONG TERM GOAL #3   Title  Patient will improve her ability to perform to 48m/s to indicate increased safety with walking at community ambulation    Baseline  .22m/s; 01/01/2018: .88m/s; 02/12/2018: .62m/s; 03/28/18 .94 m/s; 8/19/9: testing of goals deferred to next session; 06/28/2018: .62m/s; 09/24/2018: deferred; 10/08/2018: .85 m/s; 11/12/2018:  .89 m/s; 02/05/2019: .42m/s; 03/25/2019: .65m/s 05/13/2019: 1.63m/s    Time  8    Period  Weeks    Status  Achieved      PT LONG TERM GOAL #4   Title  Patient will improve 5XSTS to under 16 sec to indicate significant improvement LE functional strength and decrease fall risk.    Baseline  30sec; 01/01/2018: 12 sec    Time  8    Period  Weeks    Status  Achieved      PT LONG TERM GOAL #5   Title  Pt will ambulate 536ft in 3 minutes to demonstrate improved endurance with walking into department stores from the parking lot.     Baseline  03/28/18: 325ft; 04/30/18: testing of goals deferred to next session; 06/28/2018: 271ft; 09/24/2018: deferred; 10/08/2018: 228ft; 11/12/2018: 580ft with rollator; 02/04/2019: 538ft    Time  6    Period  Weeks    Status  Achieved      Additional Long Term Goals   Additional Long Term Goals  Yes      PT LONG TERM GOAL #6   Title  Patient will be able to perform >1021ft in a with a rollator to decrease fall risk with ambulating at community distances.    Baseline  857ft with rollator; 05/13/2019: 1069ft    Time  6    Period  Weeks    Status  Achieved      PT LONG TERM GOAL #7   Title  Patient will improve DGI by 3 points to indicate singificant improvement in dynamic balance and decrease fall risk.    Baseline  05/13/2019:15/24    Time  6    Period  Weeks    Status  New            Plan - 06/04/19 1712    Clinical Impression Statement  Patient demonstrates  improvement with dynamic balance with weight shift in all directions and reduction of UE assist. Shows reduced fear of movement with dancing activities. Able to perform sit <>  stand without UE assist with therapist instruction but with significant LE adduction compensation. Patient will benefit from skilled PT to attain PLOF.    Rehab Potential  Fair    Clinical Impairments Affecting Rehab Potential  (+) family support, decreased coormorbities (-) age    PT Frequency  1x / week    PT Duration  6 weeks    PT Treatment/Interventions  Balance training;Neuromuscular re-education;Gait training;Patient/family education;Therapeutic exercise;Therapeutic activities;Functional mobility training;Stair training;Moist Heat;Aquatic Therapy;Electrical Stimulation;Cryotherapy;Manual techniques    PT Next Visit Plan  Attempt more dynamic balance tasks, ambulation over obstacles, ambulation with head turns, stepping response tasks    PT Home Exercise Plan  See education    Consulted and Agree with Plan of Care  Patient    Family Member Consulted  daughter       Patient will benefit from skilled therapeutic intervention in order to improve the following deficits and impairments:  Abnormal gait, Decreased coordination, Decreased endurance, Decreased activity tolerance, Decreased strength, Decreased range of motion, Decreased balance, Difficulty walking  Visit Diagnosis: Difficulty in walking, not elsewhere classified  Muscle weakness (generalized)     Problem List Patient Active Problem List   Diagnosis Date Noted  . Atrial fibrillation ([place]) 04/26/2017  . Anemia 04/26/2017  . Ischemic bowel disease ([place]) 03/18/2017  . 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 Macular degeneration of left eye 01/06/2015  . Essential hypertension 12/25/2013  . BACK PAIN 08/02/2010    Virgia Land, SPT 06/04/2019, 5:13 PM  [place] [address] [place], [address] Phone: [phone]   Fax:  [phone]  Name: SHERESA CULLOP MRN: [id number] Date of Birth: September 15, 1924

## 2019-06-11 ENCOUNTER — Ambulatory Visit: Payer: PPO

## 2019-06-12 ENCOUNTER — Encounter: Payer: Self-pay | Admitting: Internal Medicine

## 2019-06-12 ENCOUNTER — Ambulatory Visit (INDEPENDENT_AMBULATORY_CARE_PROVIDER_SITE_OTHER): Payer: PPO | Admitting: Internal Medicine

## 2019-06-12 ENCOUNTER — Other Ambulatory Visit: Payer: Self-pay

## 2019-06-12 VITALS — BP 160/90 | HR 70 | Ht 59.0 in | Wt 123.5 lb

## 2019-06-12 DIAGNOSIS — I10 Essential (primary) hypertension: Secondary | ICD-10-CM

## 2019-06-12 DIAGNOSIS — I4819 Other persistent atrial fibrillation: Secondary | ICD-10-CM | POA: Diagnosis not present

## 2019-06-12 DIAGNOSIS — R0609 Other forms of dyspnea: Secondary | ICD-10-CM

## 2019-06-12 MED ORDER — LISINOPRIL 20 MG PO TABS: 20.0000 mg | ORAL_TABLET | Freq: Every day | ORAL | 2 refills | Status: DC

## 2019-06-12 NOTE — Progress Notes (Signed)
Follow-up Outpatient Visit Date: 06/12/2019  Primary Care Provider: Hannah Beat, MD [address] [place] [address]  Chief Complaint: Follow-up atrial fibrillation and dyspnea on exertion  HPI:  Ms. Madonia is a 85 y.o. year-old female with history of persistent atrial fibrillation, hypertension, ischemic bowel status post small bowel resection, osteoporosis, and anemia, who presents for follow-up of atrial fibrillation.  We last spoke in early April via virtual visit, at which time Ms Mozingo reported improving strength and energy.  She endorsed occasional dyspnea on exertion when walking extended distances at a fast pace.  She also reported occasional brief orthostatic lightheadedness but otherwise no symptoms.  Today, Ms. Joaquin reports that her exertional dyspnea is a little better than at our last visit.  She continues to have occasional orthostatic lightheadedness lasting 1 or 2 seconds when she sits up too quickly.  She denies chest pain, palpitations, and edema.  She continues to work with physical therapy at least once a week, though she is concerned about the cost of continuing physical therapy as well as rivaroxaban as she is now in the donut hole.  Home blood pressures are typically 130-160 systolic.  --------------------------------------------------------------------------------------------------  Past Medical History:  Diagnosis Date  . Anemia   . Back pain   . Hypertension 12/25/2013  . Ischemic bowel disease (HCC)    a. 03/2017 abd pain-->internal herina and ischemic jejunum s/p ex lap and small bowel resection.  . Macular degeneration of left eye 01/06/2015  . Osteoporosis   . Persistent atrial fibrillation    a. First noted 03/2017;  b. 07/2017 Echo: EF 60-65%, no rwma, mild MR, mildly to mod dil LA; c. 07/2017 48hr Holter: persistent Afib, avg HR of 66 (range 39-140 bpm);  d. CHA2DS2VASc = 4-->Xarelto initiated 07/2017.   Past Surgical History:   Procedure Laterality Date  . CLOSED REDUCTION PROXIMAL HUMERUS FRACTURE Left 2017  . LAPAROSCOPIC SMALL BOWEL RESECTION    . LAPAROTOMY N/A 03/18/2017   Procedure: EXPLORATORY LAPAROTOMY, SMALL BOWEL RESECTION;  Surgeon: Leafy Ro, MD;  Location: [facility] ORS;  Service: General;  Laterality: N/A;  . TOTAL ABDOMINAL HYSTERECTOMY      Current Meds  Medication Sig  . docusate sodium (COLACE) 100 MG capsule Take 200 mg by mouth daily.   . fluticasone (FLONASE) 50 MCG/ACT nasal spray Place 2 sprays into both nostrils daily.  Marland Kitchen lisinopril (PRINIVIL,ZESTRIL) 20 MG tablet Take 1 tablet (20 mg total) by mouth daily.  . metoprolol succinate (TOPROL-XL) 25 MG 24 hr tablet Take 1 tablet by mouth once daily  . Multiple Vitamins-Minerals (ICAPS) CAPS Take 1 capsule by mouth 2 (two) times daily.   . NON FORMULARY Vitamin D 50 mcg, take one daily by mouth  . rivaroxaban (XARELTO) 20 MG TABS tablet Take 1 tablet (20 mg total) by mouth daily with supper.  Marland Kitchen VITAMIN D, CHOLECALCIFEROL, PO Take by mouth daily.    Allergies: Latex, Tetanus toxoid, and Alendronate sodium  Social History   Tobacco Use  . Smoking status: Former Games developer  . Smokeless tobacco: Never Used  Substance Use Topics  . Alcohol use: No  . Drug use: No    Family History  Problem Relation Age of Onset  . Diabetes Mother   . Cancer Father        jaw    Review of Systems: A 12-system review of systems was performed and was negative except as noted in the HPI.  --------------------------------------------------------------------------------------------------  Physical Exam: BP (!) 160/90 (  BP Location: Left Arm, Patient Position: Sitting, Cuff Size: Normal)   Pulse 70   Ht 4\' 11"  (1.499 m)   Wt 123 lb 8 oz (56 kg)   SpO2 98%   BMI 24.94 kg/m   General: NAD.  Accompanied by her daughter. HEENT: No conjunctival pallor or scleral icterus. Moist mucous membranes.  OP clear. Neck: Supple without lymphadenopathy, thyromegaly,  JVD, or HJR. Lungs: Normal work of breathing. Clear to auscultation bilaterally without wheezes or crackles. Heart: Irregularly irregular without murmurs, rubs, or gallops. Abd: Bowel sounds present. Soft, NT/ND without hepatosplenomegaly Ext: No lower extremity edema. Radial, PT, and DP pulses are 2+ bilaterally. Skin: Warm and dry without rash.  EKG: Atrial fibrillation with nonspecific ST segment changes.  No significant change from prior tracing.  Lab Results  Component Value Date   WBC 5.7 02/18/2019   HGB 12.9 02/18/2019   HCT 39.5 02/18/2019   MCV 78.7 02/18/2019   PLT 180.0 02/18/2019    Lab Results  Component Value Date   NA 142 02/18/2019   K 3.7 02/18/2019   CL 106 02/18/2019   CO2 28 02/18/2019   BUN 15 02/18/2019   CREATININE 0.77 02/18/2019   GLUCOSE 106 (H) 02/18/2019   ALT 9 08/22/2018    Lab Results  Component Value Date   CHOL 169 02/18/2019   HDL 61.30 02/18/2019   LDLCALC 91 02/18/2019   LDLDIRECT 101.2 05/20/2009   TRIG 85.0 02/18/2019   CHOLHDL 3 02/18/2019    --------------------------------------------------------------------------------------------------  ASSESSMENT AND PLAN: Persistent atrial fibrillation: Heart rate adequately controlled.  Chronic dyspnea on exertion is actually little bit better today.  This is likely multifactorial.  We will continue current regimen of metoprolol and rivaroxaban.  Hypertension: Blood pressure elevated today as well as often on home checks.  We will increase lisinopril to 20 mg daily and check a basic metabolic panel in about 2 weeks.  I have encouraged Ms. Kalbfleisch to contact us if her blood pressure remains consistently above 140/90.  Dyspnea on exertion: Longstanding and likely multifactorial.  It has actually improved a little since our last visit.  We will not make any medication changes other than the aforementioned escalation of lisinopril.  I encouraged Ms. Teicher to continue exercising and working  with PT, if possible.  Follow-up: Return to clinic in 6 months.  Nelva Bush, MD 06/12/2019 1:47 PM

## 2019-06-12 NOTE — Patient Instructions (Signed)
Medication Instructions:  Your physician has recommended you make the following change in your medication:  1- INCREASE  Lisinopril to 1.5 tablets (30 mg) by mouth once a day.  If you need a refill on your cardiac medications before your next appointment, please call your pharmacy.   Lab work: Your physician recommends that you return for lab work in: 2 weeks around 06/26/19.  BMET - Please go to the [facility]. You will check in at the front desk to the right as you walk into the atrium. Valet Parking is offered if needed.   If you have labs (blood work) drawn today and your tests are completely normal, you will receive your results only by: Marland Kitchen MyChart Message (if you have MyChart) OR . A paper copy in the mail If you have any lab test that is abnormal or we need to change your treatment, we will call you to review the results.  Testing/Procedures: NONE  Follow-Up: At [facility], you and your health needs are our priority.  As part of our continuing mission to provide you with exceptional heart care, we have created designated Provider Care Teams.  These Care Teams include your primary Cardiologist (physician) and Advanced Practice Providers (APPs -  Physician Assistants and Nurse Practitioners) who all work together to provide you with the care you need, when you need it. You will need a follow up appointment in 6 months.  Please call our office 2 months in advance to schedule this appointment.  You may see Nelva Bush, MD or one of the following Advanced Practice Providers on your designated Care Team:   Murray Hodgkins, NP Christell Faith, PA-C . Marrianne Mood, PA-C

## 2019-06-13 ENCOUNTER — Encounter: Payer: Self-pay | Admitting: Internal Medicine

## 2019-06-18 ENCOUNTER — Other Ambulatory Visit: Payer: Self-pay

## 2019-06-18 ENCOUNTER — Ambulatory Visit: Payer: PPO | Attending: Family Medicine

## 2019-06-18 DIAGNOSIS — R262 Difficulty in walking, not elsewhere classified: Secondary | ICD-10-CM | POA: Insufficient documentation

## 2019-06-18 DIAGNOSIS — M6281 Muscle weakness (generalized): Secondary | ICD-10-CM

## 2019-06-19 NOTE — Therapy (Signed)
[facility] [address] [place], [address] Phone: [phone]   Fax:  [phone]  Physical Therapy Treatment  Patient Details  Name: Paula Luna MRN: [id number] Date of Birth: [birth year] No data recorded  Encounter Date: 06/18/2019  PT End of Session - 06/19/19 0946    Visit Number  71    Number of Visits  75    Date for PT Re-Evaluation  06/24/19    Authorization Type  FOTO    Authorization Time Period  4/10    PT Start Time  1615    PT Stop Time  1700    PT Time Calculation (min)  45 min    Equipment Utilized During Treatment  Gait belt    Activity Tolerance  Patient tolerated treatment well    Behavior During Therapy  WFL for tasks assessed/performed       Past Medical History:  Diagnosis Date  . Anemia   . Back pain   . Hypertension 12/25/2013  . Ischemic bowel disease ([place])    a. 03/2017 abd pain-->internal herina and ischemic jejunum s/p ex lap and small bowel resection.  . Macular degeneration of left eye 01/06/2015  . Osteoporosis   . Persistent atrial fibrillation ([place])    a. First noted 03/2017;  b. 07/2017 Echo: EF 60-65%, no rwma, mild MR, mildly to mod dil LA; c. 07/2017 48hr Holter: persistent Afib, avg HR of 66 (range 39-140 bpm);  d. CHA2DS2VASc = 4-->Xarelto initiated 07/2017.    Past Surgical History:  Procedure Laterality Date  . CLOSED REDUCTION PROXIMAL HUMERUS FRACTURE Left 2017  . LAPAROSCOPIC SMALL BOWEL RESECTION    . LAPAROTOMY N/A 03/18/2017   Procedure: EXPLORATORY LAPAROTOMY, SMALL BOWEL RESECTION;  Surgeon: Jules Husbands, MD;  Location: [facility] ORS;  Service: General;  Laterality: N/A;  . TOTAL ABDOMINAL HYSTERECTOMY      There were no vitals filed for this visit.  Subjective Assessment - 06/18/19 1629    Subjective  Patient reports no major changes and states she's been walking and ascending/descending the stairs.    Pertinent History  Small bowel surgery     Limitations  Standing;Walking    Patient Stated Goals  To walk without feeling of falling     Currently in Pain?  No/denies           TREATMENT Therapeutic Exercise Side stepping up and over  6" step - x 10 with UE support Nustep to improve strength and endurance in the UE/LE level 5 - x75min  Ambulation in clinic --  x335ft Side stepping over 4 point canes - x 10 B Squats in standing with UE support - x 20 Hip abduction with opposite foot taps - x 10 with therapist support  Hip extension with therapist support - x 20   Pt most challenged by balance activities this session, with fatigue pt had difficulty maintaining upright posture/exercise technique    PT Education - 06/18/19 1630    Education provided  Yes    Education Details  form/technique with exercise    Person(s) Educated  Patient    Methods  Explanation;Demonstration    Comprehension  Verbalized understanding;Returned demonstration          PT Long Term Goals - 05/13/19 1709      PT LONG TERM GOAL #1   Title  Patient will be independent with HEP focused on decreasing fall risk to continue benefits of therapy after discharge.  Baseline  Dependent with exercise performance; 01/01/2018: moderate cueing for performance. 03/28/18 min-mod cueing required; 04/30/18: min cueing required; 09/24/2018: min cueing required; 11/12/2018: Patient performs exercise with little to no cueing.     Time  6    Period  Weeks    Status  Achieved      PT LONG TERM GOAL #2   Title  Patient will improve TUG to under 12 sec to indicate singificant decrease in fall risk.     Baseline  20sec; 01/01/2018: 14sec; 02/12/2018; 11.59 sec;    Time  8    Period  Weeks    Status  Achieved      PT LONG TERM GOAL #3   Title  Patient will improve her ability to perform 10mWT to 6144m/s to indicate increased safety with walking at community ambulation    Baseline  .63444m/s; 01/01/2018: .9196m/s; 02/12/2018: .2784m/s; 03/28/18 .94 m/s; 8/19/9: testing of goals  deferred to next session; 06/28/2018: .231m/s; 09/24/2018: deferred; 10/08/2018: .85 m/s; 11/12/2018:  .89 m/s; 02/05/2019: .1584m/s; 03/25/2019: .85944m/s 05/13/2019: 1.3670m/s    Time  8    Period  Weeks    Status  Achieved      PT LONG TERM GOAL #4   Title  Patient will improve 5XSTS to under 16 sec to indicate significant improvement LE functional strength and decrease fall risk.    Baseline  30sec; 01/01/2018: 12 sec    Time  8    Period  Weeks    Status  Achieved      PT LONG TERM GOAL #5   Title  Pt will ambulate 51900ft in 3 minutes to demonstrate improved endurance with walking into department stores from the parking lot.     Baseline  03/28/18: 38550ft; 04/30/18: testing of goals deferred to next session; 06/28/2018: 27450ft; 09/24/2018: deferred; 10/08/2018: 25400ft; 11/12/2018: 54650ft with rollator; 02/04/2019: 53250ft    Time  6    Period  Weeks    Status  Achieved      Additional Long Term Goals   Additional Long Term Goals  Yes      PT LONG TERM GOAL #6   Title  Patient will be able to perform >102300ft in a 6minWT with a rollator to decrease fall risk with ambulating at community distances.    Baseline  88700ft with rollator; 05/13/2019: 108490ft    Time  6    Period  Weeks    Status  Achieved      PT LONG TERM GOAL #7   Title  Patient will improve DGI by 3 points to indicate singificant improvement in dynamic balance and decrease fall risk.    Baseline  05/13/2019:15/24    Time  6    Period  Weeks    Status  New            Plan - 06/19/19 0946    Clinical Impression Statement  Focused on improving balance and LE strengthening with exercise performance. Patient demonstrates improvement with UE swing with exercise, however, requires verbal cueing to perform lumbar extension in standing. Patient will benefit from further skilled therapy to return to prior level of function.    Rehab Potential  Fair    Clinical Impairments Affecting Rehab Potential  (+) family support, decreased coormorbities (-) age     PT Frequency  1x / week    PT Duration  6 weeks    PT Treatment/Interventions  Balance training;Neuromuscular re-education;Gait training;Patient/family education;Therapeutic exercise;Therapeutic activities;Functional mobility training;Stair training;Moist Heat;Aquatic Therapy;Lobbyistlectrical  Stimulation;Cryotherapy;Manual techniques    PT Next Visit Plan  Attempt more dynamic balance tasks, ambulation over obstacles, ambulation with head turns, stepping response tasks    PT Home Exercise Plan  See education    Consulted and Agree with Plan of Care  Patient    Family Member Consulted  daughter       Patient will benefit from skilled therapeutic intervention in order to improve the following deficits and impairments:  Abnormal gait, Decreased coordination, Decreased endurance, Decreased activity tolerance, Decreased strength, Decreased range of motion, Decreased balance, Difficulty walking  Visit Diagnosis: Difficulty in walking, not elsewhere classified  Muscle weakness (generalized)     Problem List Patient Active Problem List   Diagnosis Date Noted  . Atrial fibrillation (HCC) 04/26/2017  . Anemia 04/26/2017  . 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 Macular degeneration of left eye 01/06/2015  . Essential hypertension 12/25/2013  . BACK PAIN 08/02/2010    Myrene Galas, PT DPT 06/19/2019, 9:59 AM  [facility] [address], [place], [address] Phone: [phone]   Fax:  [phone]  Name: SHEROLYN TRETTIN MRN: [id number] Date of Birth: 1925-07-17

## 2019-06-25 ENCOUNTER — Telehealth: Payer: Self-pay

## 2019-06-25 NOTE — Telephone Encounter (Signed)
[name] (DPR signed) left v/m wanting to know if pt should take zinc to help prevent the covid virus. Pt does take Vit D 3. [name] request cb.

## 2019-06-25 NOTE — Telephone Encounter (Signed)
To my knowledge, this will not help.  Wash hands and keep social distancing and wearing masks.

## 2019-06-26 NOTE — Telephone Encounter (Signed)
Left message for [name] that zinc this will not help prevent covid.  Per Dr. Lorelei Pont wash hands, keep social distancing and wearing masks is best way to prevent covid.

## 2019-07-01 ENCOUNTER — Other Ambulatory Visit
Admission: RE | Admit: 2019-07-01 | Discharge: 2019-07-01 | Disposition: A | Discharge status: Home / Self Care | Payer: PPO | Source: Ambulatory Visit | Attending: Internal Medicine | Admitting: Internal Medicine

## 2019-07-01 DIAGNOSIS — R0609 Other forms of dyspnea: Secondary | ICD-10-CM

## 2019-07-01 DIAGNOSIS — R0600 Dyspnea, unspecified: Secondary | ICD-10-CM | POA: Diagnosis not present

## 2019-07-01 DIAGNOSIS — I10 Essential (primary) hypertension: Secondary | ICD-10-CM | POA: Insufficient documentation

## 2019-07-01 LAB — BASIC METABOLIC PANEL
Anion gap: 9 (ref 5–15)
BUN: 20 mg/dL (ref 8–23)
CO2: 25 mmol/L (ref 22–32)
Calcium: 9.4 mg/dL (ref 8.9–10.3)
Chloride: 106 mmol/L (ref 98–111)
Creatinine, Ser: 0.81 mg/dL (ref 0.44–1.00)
GFR calc Af Amer: >60 mL/min (ref >60)
GFR calc non Af Amer: >60 mL/min (ref >60)
Glucose, Bld: 92 mg/dL (ref 70–99)
Potassium: 4.1 mmol/L (ref 3.5–5.1)
Sodium: 140 mmol/L (ref 135–145)

## 2019-07-02 ENCOUNTER — Ambulatory Visit: Payer: PPO

## 2019-07-02 ENCOUNTER — Other Ambulatory Visit: Payer: Self-pay

## 2019-07-02 DIAGNOSIS — R262 Difficulty in walking, not elsewhere classified: Secondary | ICD-10-CM

## 2019-07-02 DIAGNOSIS — M6281 Muscle weakness (generalized): Secondary | ICD-10-CM

## 2019-07-02 NOTE — Therapy (Addendum)
[facility] [address] [place], [address] Phone: [phone]   Fax:  [phone]  Physical Therapy Treatment/ Progress Note  Patient Details  Name: Paula Luna MRN: [id number] Date of Birth: June 06, 1925 No data recorded  Encounter Date: 07/02/2019   Recording period: 05/13/2019 - 07/02/2019  PT End of Session - 07/02/19 1716    Visit Number  66    Number of Visits  63    Date for PT Re-Evaluation  06/24/19    Authorization Type  FOTO    Authorization Time Period  4/10    PT Start Time  1615    PT Stop Time  1700    PT Time Calculation (min)  45 min    Equipment Utilized During Treatment  Gait belt    Activity Tolerance  Patient tolerated treatment well    Behavior During Therapy  WFL for tasks assessed/performed       Past Medical History:  Diagnosis Date  . Anemia   . Back pain   . Hypertension 12/25/2013  . Ischemic bowel disease ([place])    a. 03/2017 abd pain-->internal herina and ischemic jejunum s/p ex lap and small bowel resection.  . Macular degeneration of left eye 01/06/2015  . Osteoporosis   . Persistent atrial fibrillation ([place])    a. First noted 03/2017;  b. 07/2017 Echo: EF 60-65%, no rwma, mild MR, mildly to mod dil LA; c. 07/2017 48hr Holter: persistent Afib, avg HR of 66 (range 39-140 bpm);  d. CHA2DS2VASc = 4-->Xarelto initiated 07/2017.    Past Surgical History:  Procedure Laterality Date  . CLOSED REDUCTION PROXIMAL HUMERUS FRACTURE Left 2017  . LAPAROSCOPIC SMALL BOWEL RESECTION    . LAPAROTOMY N/A 03/18/2017   Procedure: EXPLORATORY LAPAROTOMY, SMALL BOWEL RESECTION;  Surgeon: Jules Husbands, MD;  Location: [facility] ORS;  Service: General;  Laterality: N/A;  . TOTAL ABDOMINAL HYSTERECTOMY      There were no vitals filed for this visit.  Subjective Assessment - 07/02/19 1715    Subjective  Pt's daughter reports concern with high BP readings in MD's office recently.     Pertinent History  Small bowel surgery     Limitations  Standing;Walking    Patient Stated Goals  To walk without feeling of falling     Currently in Pain?  No/denies    Pain Score  0-No pain    Multiple Pain Sites  No        Vitals BP: 165/82  TE AMB 100 ft HHA Box steps 2 x 40 with HHA "Waltz" steps 3 x 30 with HHA moderate tempo (backwards step, wide 45 deg. Step, narrow step) Standing hip abd B x 10 with UE assist for balance Standing bilateral alternating hip flexion/kicks "can-can" with UE assist x 10 for balance "Swing" basics open position B HHA x 20 moderate tempo (lateral steps, weight shifting backwards)  Exercises for improved balance in multiple directions with fixed tempo for improved neuromotor recruitment, feedforward planning, and motor patterning      PT Education - 07/02/19 1716    Education provided  Yes    Education Details  form/technique with exercise    Person(s) Educated  Patient    Methods  Explanation;Demonstration;Tactile cues;Verbal cues    Comprehension  Verbalized understanding;Returned demonstration;Verbal cues required;Tactile cues required          PT Long Term Goals - 05/13/19 1709      PT LONG TERM GOAL #  1   Title  Patient will be independent with HEP focused on decreasing fall risk to continue benefits of therapy after discharge.     Baseline  Dependent with exercise performance; 01/01/2018: moderate cueing for performance. 03/28/18 min-mod cueing required; 04/30/18: min cueing required; 09/24/2018: min cueing required; 11/12/2018: Patient performs exercise with little to no cueing.     Time  6    Period  Weeks    Status  Achieved      PT LONG TERM GOAL #2   Title  Patient will improve TUG to under 12 sec to indicate singificant decrease in fall risk.     Baseline  20sec; 01/01/2018: 14sec; 02/12/2018; 11.59 sec;    Time  8    Period  Weeks    Status  Achieved      PT LONG TERM GOAL #3   Title  Patient will improve her ability to  perform to 49m/s to indicate increased safety with walking at community ambulation    Baseline  .31m/s; 01/01/2018: .70m/s; 02/12/2018: .20m/s; 03/28/18 .94 m/s; 8/19/9: testing of goals deferred to next session; 06/28/2018: .36m/s; 09/24/2018: deferred; 10/08/2018: .85 m/s; 11/12/2018:  .89 m/s; 02/05/2019: .39m/s; 03/25/2019: .27m/s 05/13/2019: 1.8m/s    Time  8    Period  Weeks    Status  Achieved      PT LONG TERM GOAL #4   Title  Patient will improve 5XSTS to under 16 sec to indicate significant improvement LE functional strength and decrease fall risk.    Baseline  30sec; 01/01/2018: 12 sec    Time  8    Period  Weeks    Status  Achieved      PT LONG TERM GOAL #5   Title  Pt will ambulate 533ft in 3 minutes to demonstrate improved endurance with walking into department stores from the parking lot.     Baseline  03/28/18: 367ft; 04/30/18: testing of goals deferred to next session; 06/28/2018: 226ft; 09/24/2018: deferred; 10/08/2018: 223ft; 11/12/2018: 53ft with rollator; 02/04/2019: 545ft    Time  6    Period  Weeks    Status  Achieved      Additional Long Term Goals   Additional Long Term Goals  Yes      PT LONG TERM GOAL #6   Title  Patient will be able to perform >1062ft in a with a rollator to decrease fall risk with ambulating at community distances.    Baseline  846ft with rollator; 05/13/2019: 1063ft    Time  6    Period  Weeks    Status  Achieved      PT LONG TERM GOAL #7   Title  Patient will improve DGI by 3 points to indicate singificant improvement in dynamic balance and decrease fall risk.    Baseline  05/13/2019:15/24    Time  6    Period  Weeks    Status  New            Plan - 07/02/19 1717    Clinical Impression Statement  Patient demonstrates reduced fear of falling with exercise as evidenced from wide lateral steps in waltz without apprehension or increased UE assist. Improved posture demonstrated with hip flexion/kicks exercises. Patient demonstrates  improved functional capacity but easy fatiguability remains. Patient will benefit from further skilled physical therapy to return to PLOF    Rehab Potential  Fair    Clinical Impairments Affecting Rehab Potential  (+) family support, decreased coormorbities (-) age  PT Frequency  1x / week    PT Duration  6 weeks    PT Treatment/Interventions  Balance training;Neuromuscular re-education;Gait training;Patient/family education;Therapeutic exercise;Therapeutic activities;Functional mobility training;Stair training;Moist Heat;Aquatic Therapy;Electrical Stimulation;Cryotherapy;Manual techniques    PT Next Visit Plan  Attempt more dynamic balance tasks, ambulation over obstacles, ambulation with head turns, stepping response tasks    PT Home Exercise Plan  See education    Consulted and Agree with Plan of Care  Patient    Family Member Consulted  daughter       Patient will benefit from skilled therapeutic intervention in order to improve the following deficits and impairments:  Abnormal gait, Decreased coordination, Decreased endurance, Decreased activity tolerance, Decreased strength, Decreased range of motion, Decreased balance, Difficulty walking  Visit Diagnosis: Difficulty in walking, not elsewhere classified  Muscle weakness (generalized)     Problem List Patient Active Problem List   Diagnosis Date Noted  . Atrial fibrillation ([facility]) 04/26/2017  . Anemia 04/26/2017  . 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 Macular degeneration of left eye 01/06/2015  . Essential hypertension 12/25/2013  . BACK PAIN 08/02/2010    Janee Mornory Shaarav Ripple, SPT 07/02/2019, 5:20 PM  [facility] [address] [place], [place], [id number] Phone: [phone]   Fax:  [phone]  Name: Lattie CornsRoxanne D Luna MRN: [id number] Date of Birth: [birth year]

## 2019-07-03 ENCOUNTER — Telehealth: Payer: Self-pay | Admitting: *Deleted

## 2019-07-03 ENCOUNTER — Telehealth: Payer: Self-pay

## 2019-07-03 MED ORDER — RIVAROXABAN 15 MG PO TABS: 15.0000 mg | ORAL_TABLET | Freq: Every day | ORAL | 1 refills | Status: DC

## 2019-07-03 MED ORDER — LISINOPRIL 20 MG PO TABS: 30.0000 mg | ORAL_TABLET | Freq: Every day | ORAL | 2 refills | Status: DC

## 2019-07-03 NOTE — Telephone Encounter (Signed)
Patient daughter returning call  °

## 2019-07-03 NOTE — Telephone Encounter (Signed)
Called and spoke with daughter. She will continue to monitor patient's BP/HR this week and call us next week if remains >140/90.

## 2019-07-03 NOTE — Telephone Encounter (Signed)
No answer. Left detailed message with recommendations, ok per DPR, and to call back if any questions. 

## 2019-07-03 NOTE — Addendum Note (Signed)
Addended by: Blain Pais on: 07/03/2019 04:01 PM   Modules accepted: Orders

## 2019-07-03 NOTE — Telephone Encounter (Signed)
Let's continue current dose of lisinopril for another week.  If it remains elevated, we may need to increase it further to 40 mg daily.  Nelva Bush, MD [facility] Pager: [phone]

## 2019-07-03 NOTE — Telephone Encounter (Signed)
-----   Message from Nelva Bush, MD sent at 07/01/2019  5:02 PM EDT ----- Please let Ms. Sonntag know that her labs are normal with stable kidney function and potassium.  She should continue her current medications and let us know if her blood pressure remains consistently above 140/90.

## 2019-07-03 NOTE — Telephone Encounter (Signed)
Per Paula Luna, RpH, manufacturer & FDA recommendations are that if age >28 & CrCl < 67ml/min, Xarelto 15 mg is preferred dosage.   Dr. Saunders Revel gave verbal order to "make it so". New dosage sent in as requested.

## 2019-07-03 NOTE — Telephone Encounter (Signed)
Received refill request for pt's Xarelto 20 mg once daily from [facility]. Pt's age 85, wt 40 kg, SCr 0.81, CrCl 39.18, last ov w/ Dr. Saunders Revel 06/12/19. Pt's age is > 75 and CrCl < 29mL/min.  Per protocol, pt should be on Xarelto 15 mg once daily. Routing to Dr. Saunders Revel to see if he is agreeable to dosage change.

## 2019-07-03 NOTE — Telephone Encounter (Signed)
Spoke with patient's daughter, ok per DPR. She verbalized understanding of the results. States patient's BP has still been running high. Mainly, 160-170/90's. Only one day was it 150 on the top number. The first week they had some issues with splitting the lisinopril in half but now they have a pill cutter and the dosage is more precise. They have gotten a new BP cuff and still are seeing the high readings. Patient feels fine. She is going to PT therapy and enjoying dancing there.  Daughter says patient has been feeling stressed at times because feels like people do not want to be around her though she explains its because people do not want to give her the virus. Also, patient watches tv that sometimes stresses her out. Daughter is encouraging her to not watch so much tv.  They were thinking they would try one more week of Lisinopril 30 mg with the pill cutter. Advised I will update Dr End and let them know if any further recommendations. Updated medlist to reflect 30 mg daily.

## 2019-07-05 NOTE — Telephone Encounter (Addendum)
Returned a call to the pt/dtr and spoke with the dtr [name] and she stated that no one told her that the pt was supposed to taking Xarelto 15mg . Apologized for that and updated her on the reason why and she verbalized understanding. She stated the pt will be using [facility] but in January she will start back using [facility] locally. She asked if I could call to let them know that the pt's dose has changed and I stated I will so that in January we can send there once we are notified by her or the pharmacy. Called the pharmacy and let them know that the dose has changed to Xarelto 15mg  and that once we are ready to fill it for her we will let them know or the pt can let us know to send here with the correct dose.  She stated she only has the 20mg  Xarelto tablets on hand but mail order will overnight them to her. She wanted to know if the pt could take a Xarelto 20mg  tonight and start the Xarelto 15mg  tabs tomorrow and advised yes since the med will arrive to tomorrow and approval from Pharmacist to do so.   Called the dtr/pt and had to leave a message stating to call us once they are ready to have Xarelto 15mg  prescription sent to local pharmacy.  Pt is 85 yrs old, wt-56kg, Crea-0.81 on 07/01/2019, CrCl-39.43ml/min so pt should be Xarelto 15mg  and not the Xarelto 20mg . There is a note from 07/03/2019 stating the dose was changed per verbal from Dr. Saunders Revel.

## 2019-07-05 NOTE — Telephone Encounter (Signed)
Pt c/o medication issue:  1. Name of Medication:  Xarelto   2. How are you currently taking this medication (dosage and times per day)? 20 vs 15 mg po   3. Are you having a reaction (difficulty breathing--STAT)? No   4. What is your medication issue? Please call to clarify med dose change .  Daughter calling to discuss med change error   Please call to discuss dose change and also if ok to take the 20 mg pills she currently has at home

## 2019-07-16 ENCOUNTER — Other Ambulatory Visit: Payer: Self-pay

## 2019-07-16 ENCOUNTER — Ambulatory Visit: Payer: PPO | Attending: Family Medicine

## 2019-07-16 DIAGNOSIS — R262 Difficulty in walking, not elsewhere classified: Secondary | ICD-10-CM | POA: Insufficient documentation

## 2019-07-16 DIAGNOSIS — M6281 Muscle weakness (generalized): Secondary | ICD-10-CM | POA: Diagnosis not present

## 2019-07-16 NOTE — Addendum Note (Signed)
Addended by: Blain Pais on: 07/16/2019 04:58 PM   Modules accepted: Orders

## 2019-07-16 NOTE — Therapy (Signed)
[facility] [address], [place], [address] Phone: [phone]   Fax:  [phone]  Physical Therapy Treatment  Patient Details  Name: Paula Luna MRN: [id number] Date of Birth: Dec 01, 1924 No data recorded  Encounter Date: 07/16/2019  PT End of Session - 07/16/19 1643    Visit Number  68    Number of Visits  67    Date for PT Re-Evaluation  07/09/19    Authorization Type  FOTO    Authorization Time Period  7/10    PT Start Time  1633    PT Stop Time  1715    PT Time Calculation (min)  42 min    Equipment Utilized During Treatment  Gait belt    Activity Tolerance  Patient tolerated treatment well;Patient limited by fatigue    Behavior During Therapy  WFL for tasks assessed/performed       Past Medical History:  Diagnosis Date  . Anemia   . Back pain   . Hypertension 12/25/2013  . Ischemic bowel disease ([facility])    a. 03/2017 abd pain-->internal herina and ischemic jejunum s/p ex lap and small bowel resection.  . Macular degeneration of left eye 01/06/2015  . Osteoporosis   . Persistent atrial fibrillation ([facility])    a. First noted 03/2017;  b. 07/2017 Echo: EF 60-65%, no rwma, mild MR, mildly to mod dil LA; c. 07/2017 48hr Holter: persistent Afib, avg HR of 66 (range 39-140 bpm);  d. CHA2DS2VASc = 4-->Xarelto initiated 07/2017.    Past Surgical History:  Procedure Laterality Date  . CLOSED REDUCTION PROXIMAL HUMERUS FRACTURE Left 2017  . LAPAROSCOPIC SMALL BOWEL RESECTION    . LAPAROTOMY N/A 03/18/2017   Procedure: EXPLORATORY LAPAROTOMY, SMALL BOWEL RESECTION;  Surgeon: Leafy Ro, MD;  Location: [facility] ORS;  Service: General;  Laterality: N/A;  . TOTAL ABDOMINAL HYSTERECTOMY      There were no vitals filed for this visit.  Subjective Assessment - 07/16/19 1632    Subjective  Pt reports that she did not sleep well last night. Daughter reports that BP was high (190s systolic) but doubts machine's  accuracy.    Pertinent History  Small bowel surgery     Limitations  Standing;Walking    Patient Stated Goals  To walk without feeling of falling     Currently in Pain?  No/denies         TREATMENT Warm-up (unbilled) Level 4 Nustep x 5 min  Vitals (pre-exercise) BP: 165/92 by manual aneroid sphygnamometer   TE DGI: 15/24 Balance assessment: Quiet standing: 10 sec R/L Romberg: 10 sec R/L Tandem over R: 10 sec Tandem over L: 10 sec Standing L, TTWB R: 9 sec Standing R, TTWB L: 9 sec Swing steps x 3 min with single UE assist Ramp walking up x 50 ft down x 50 ft  TE to improve dynamic balance for reduced fall risk.     PT Education - 07/16/19 1642    Education provided  Yes    Education Details  form/technique with exercise    Person(s) Educated  Patient    Methods  Explanation;Tactile cues;Verbal cues;Demonstration    Comprehension  Verbalized understanding;Returned demonstration;Verbal cues required;Tactile cues required          PT Long Term Goals - 07/16/19 1735      PT LONG TERM GOAL #1   Title  Patient will be independent with HEP focused on decreasing fall risk to continue benefits  of therapy after discharge.     Baseline  Dependent with exercise performance; 01/01/2018: moderate cueing for performance. 03/28/18 min-mod cueing required; 04/30/18: min cueing required; 09/24/2018: min cueing required; 11/12/2018: Patient performs exercise with little to no cueing.    Time  6    Period  Weeks    Status  Achieved      PT LONG TERM GOAL #2   Title  Patient will improve TUG to under 12 sec to indicate singificant decrease in fall risk.     Baseline  20sec; 01/01/2018: 14sec; 02/12/2018; 11.59 sec;    Time  8    Period  Weeks    Status  Achieved      PT LONG TERM GOAL #3   Title  Patient will improve her ability to perform 10mWT to 845m/s to indicate increased safety with walking at community ambulation    Baseline  .13445m/s; 01/01/2018: .4333m/s; 02/12/2018: .5233m/s; 03/28/18  .94 m/s; 8/19/9: testing of goals deferred to next session; 06/28/2018: .858m/s; 09/24/2018: deferred; 10/08/2018: .85 m/s; 11/12/2018:  .89 m/s; 02/05/2019: .10233m/s; 03/25/2019: .46945m/s 05/13/2019: 1.9378m/s    Time  8    Period  Weeks    Status  Achieved      PT LONG TERM GOAL #4   Title  Patient will improve 5XSTS to under 16 sec to indicate significant improvement LE functional strength and decrease fall risk.    Baseline  30sec; 01/01/2018: 12 sec    Time  8    Period  Weeks    Status  Achieved      PT LONG TERM GOAL #5   Title  Pt will ambulate 57200ft in 3 minutes to demonstrate improved endurance with walking into department stores from the parking lot.     Baseline  03/28/18: 31950ft; 04/30/18: testing of goals deferred to next session; 06/28/2018: 23250ft; 09/24/2018: deferred; 10/08/2018: 25300ft; 11/12/2018: 54650ft with rollator; 02/04/2019: 58850ft    Time  6    Period  Weeks    Status  Achieved      PT LONG TERM GOAL #6   Title  Patient will be able to perform >107200ft in a 6minWT with a rollator to decrease fall risk with ambulating at community distances.    Baseline  87800ft with rollator; 05/13/2019: 105590ft    Time  6    Period  Weeks    Status  Achieved      PT LONG TERM GOAL #7   Title  Patient will improve DGI by 3 points to indicate singificant improvement in dynamic balance and decrease fall risk.    Baseline  05/13/2019:15/2410/20/2020: Deferred taking measurements secondary to time constraints; 07/16/2019 15/24    Time  6    Period  Weeks    Status  On-going            Plan - 07/16/19 1733    Clinical Impression Statement  DGI 15/24 showing no improvement but score limited by pt refusal to perform test without rollator. Patient limited primarily by fatigue and fear of falling this session. Balance demonstrates good static stability without external perturbations in Romberg and tandem stance. Reduced FOF is evident with dance activities with dynamic and wide stepping suggesting primary  limitation is in fear of falling rather than balance. Easy fatiguability remains worsened with balance activities, possibly influenced by anxiety. Patient will benefit from further skilled physical therapy to return to PLOF.    Rehab Potential  Fair    Clinical Impairments Affecting Rehab Potential  (+)  family support, decreased coormorbities (-) age    PT Frequency  1x / week    PT Duration  6 weeks    PT Treatment/Interventions  Balance training;Neuromuscular re-education;Gait training;Patient/family education;Therapeutic exercise;Therapeutic activities;Functional mobility training;Stair training;Moist Heat;Aquatic Therapy;Electrical Stimulation;Cryotherapy;Manual techniques    PT Next Visit Plan  Attempt more dynamic balance tasks, ambulation over obstacles, ambulation with head turns, stepping response tasks    PT Home Exercise Plan  See education    Consulted and Agree with Plan of Care  Patient    Family Member Consulted  daughter       Patient will benefit from skilled therapeutic intervention in order to improve the following deficits and impairments:  Abnormal gait, Decreased coordination, Decreased endurance, Decreased activity tolerance, Decreased strength, Decreased range of motion, Decreased balance, Difficulty walking  Visit Diagnosis: Difficulty in walking, not elsewhere classified  Muscle weakness (generalized)     Problem List Patient Active Problem List   Diagnosis Date Noted  . Atrial fibrillation ([place]) 04/26/2017  . Anemia 04/26/2017  . Ischemic bowel disease ([place]) 03/18/2017  . 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 Macular degeneration of left eye 01/06/2015  . Essential hypertension 12/25/2013  . BACK PAIN 08/02/2010    Virgia Land, SPT 07/16/2019, 5:45 PM  [place] [facility] [address] [place], [address] Phone: [phone]   Fax:  [phone]  Name: Paula Luna MRN:  [id number] Date of Birth: 23-Apr-1925

## 2019-07-30 ENCOUNTER — Ambulatory Visit: Payer: PPO

## 2019-07-30 ENCOUNTER — Other Ambulatory Visit: Payer: Self-pay

## 2019-07-30 VITALS — BP 160/82

## 2019-07-30 DIAGNOSIS — M6281 Muscle weakness (generalized): Secondary | ICD-10-CM

## 2019-07-30 DIAGNOSIS — R262 Difficulty in walking, not elsewhere classified: Secondary | ICD-10-CM | POA: Diagnosis not present

## 2019-07-30 NOTE — Therapy (Signed)
[place] [facility] [address] [place], [address] Phone: [phone]   Fax:  [phone]  Physical Therapy Treatment  Patient Details  Name: Paula Luna MRN: [id number] Date of Birth: 04-03-1925 No data recorded  Encounter Date: 07/30/2019  PT End of Session - 07/30/19 1550    Visit Number  25    Number of Visits  57    Date for PT Re-Evaluation  07/09/19    Authorization Type  FOTO    Authorization Time Period  8/10    PT Start Time  1538    PT Stop Time  1620    PT Time Calculation (min)  42 min    Equipment Utilized During Treatment  Gait belt    Activity Tolerance  Patient tolerated treatment well;Patient limited by fatigue    Behavior During Therapy  WFL for tasks assessed/performed       Past Medical History:  Diagnosis Date  . Anemia   . Back pain   . Hypertension 12/25/2013  . Ischemic bowel disease ([place])    a. 03/2017 abd pain-->internal herina and ischemic jejunum s/p ex lap and small bowel resection.  . Macular degeneration of left eye 01/06/2015  . Osteoporosis   . Persistent atrial fibrillation ([place])    a. First noted 03/2017;  b. 07/2017 Echo: EF 60-65%, no rwma, mild MR, mildly to mod dil LA; c. 07/2017 48hr Holter: persistent Afib, avg HR of 66 (range 39-140 bpm);  d. CHA2DS2VASc = 4-->Xarelto initiated 07/2017.    Past Surgical History:  Procedure Laterality Date  . CLOSED REDUCTION PROXIMAL HUMERUS FRACTURE Left 2017  . LAPAROSCOPIC SMALL BOWEL RESECTION    . LAPAROTOMY N/A 03/18/2017   Procedure: EXPLORATORY LAPAROTOMY, SMALL BOWEL RESECTION;  Surgeon: Jules Husbands, MD;  Location: [facility] ORS;  Service: General;  Laterality: N/A;  . TOTAL ABDOMINAL HYSTERECTOMY      Vitals:   07/30/19 1549  BP: (!) 160/82    Subjective Assessment - 07/30/19 1549    Subjective  Patient reports that she has been under a lot of stress due to the recent election and family stresses. Daughter  reports high BP this morning (192)    Pertinent History  Small bowel surgery     Limitations  Standing;Walking    Patient Stated Goals  To walk without feeling of falling     Currently in Pain?  No/denies        TREATMENT  VITALS BP 160/82   TE NuStep Level 5 x 6 min Box steps - lateral, backward, forward with cues for sequencing x 20 reps Swing underarm turns to improve turning x 3 reps Swing basics lateral and backwards steps x 20 reps  Vitals post tx: BP: 166/86    PT Education - 07/30/19 1550    Education provided  Yes    Education Details  form/technique with exercise    Person(s) Educated  Patient    Methods  Explanation;Demonstration;Tactile cues;Verbal cues    Comprehension  Verbalized understanding;Returned demonstration;Verbal cues required;Tactile cues required          PT Long Term Goals - 07/16/19 1735      PT LONG TERM GOAL #1   Title  Patient will be independent with HEP focused on decreasing fall risk to continue benefits of therapy after discharge.     Baseline  Dependent with exercise performance; 01/01/2018: moderate cueing for performance. 03/28/18 min-mod cueing required; 04/30/18: min cueing required; 09/24/2018: min  cueing required; 11/12/2018: Patient performs exercise with little to no cueing.    Time  6    Period  Weeks    Status  Achieved      PT LONG TERM GOAL #2   Title  Patient will improve TUG to under 12 sec to indicate singificant decrease in fall risk.     Baseline  20sec; 01/01/2018: 14sec; 02/12/2018; 11.59 sec;    Time  8    Period  Weeks    Status  Achieved      PT LONG TERM GOAL #3   Title  Patient will improve her ability to perform to 81m/s to indicate increased safety with walking at community ambulation    Baseline  .43m/s; 01/01/2018: .63m/s; 02/12/2018: .52m/s; 03/28/18 .94 m/s; 8/19/9: testing of goals deferred to next session; 06/28/2018: .74m/s; 09/24/2018: deferred; 10/08/2018: .85 m/s; 11/12/2018:  .89 m/s; 02/05/2019: .74m/s;  03/25/2019: .70m/s 05/13/2019: 1.53m/s    Time  8    Period  Weeks    Status  Achieved      PT LONG TERM GOAL #4   Title  Patient will improve 5XSTS to under 16 sec to indicate significant improvement LE functional strength and decrease fall risk.    Baseline  30sec; 01/01/2018: 12 sec    Time  8    Period  Weeks    Status  Achieved      PT LONG TERM GOAL #5   Title  Pt will ambulate 557ft in 3 minutes to demonstrate improved endurance with walking into department stores from the parking lot.     Baseline  03/28/18: 352ft; 04/30/18: testing of goals deferred to next session; 06/28/2018: 279ft; 09/24/2018: deferred; 10/08/2018: 26ft; 11/12/2018: 557ft with rollator; 02/04/2019: 532ft    Time  6    Period  Weeks    Status  Achieved      PT LONG TERM GOAL #6   Title  Patient will be able to perform >1052ft in a with a rollator to decrease fall risk with ambulating at community distances.    Baseline  837ft with rollator; 05/13/2019: 1025ft    Time  6    Period  Weeks    Status  Achieved      PT LONG TERM GOAL #7   Title  Patient will improve DGI by 3 points to indicate singificant improvement in dynamic balance and decrease fall risk.    Baseline  05/13/2019:15/2410/20/2020: Deferred taking measurements secondary to time constraints; 07/16/2019 15/24    Time  6    Period  Weeks    Status  On-going            Plan - 07/30/19 1621    Clinical Impression Statement  Patient demonstrates increased fatigue today and elevated BP limiting treatment time. Frequent rest breaks allocated. Fear with movement attenuated with familiar dance activity but increased with introduction of novel manuevers. Despite presentation today patient reports increased functional capacity in the home. Patient will benefit form skilled physical therapy to return to PLOF.    Rehab Potential  Fair    Clinical Impairments Affecting Rehab Potential  (+) family support, decreased coormorbities (-) age    PT Frequency   1x / week    PT Duration  6 weeks    PT Treatment/Interventions  Balance training;Neuromuscular re-education;Gait training;Patient/family education;Therapeutic exercise;Therapeutic activities;Functional mobility training;Stair training;Moist Heat;Aquatic Therapy;Electrical Stimulation;Cryotherapy;Manual techniques    PT Next Visit Plan  Attempt more dynamic balance tasks, ambulation over obstacles, ambulation with  head turns, stepping response tasks    PT Home Exercise Plan  See education    Consulted and Agree with Plan of Care  Patient    Family Member Consulted  daughter       Patient will benefit from skilled therapeutic intervention in order to improve the following deficits and impairments:  Abnormal gait, Decreased coordination, Decreased endurance, Decreased activity tolerance, Decreased strength, Decreased range of motion, Decreased balance, Difficulty walking  Visit Diagnosis: Difficulty in walking, not elsewhere classified  Muscle weakness (generalized)     Problem List Patient Active Problem List   Diagnosis Date Noted  . Atrial fibrillation ([facility]) 04/26/2017  . Anemia 04/26/2017  . 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 Macular degeneration of left eye 01/06/2015  . Essential hypertension 12/25/2013  . BACK PAIN 08/02/2010    Janee Mornory Deontre Allsup, SPT 07/30/2019, 4:25 PM  [facility] [address] [place], [place], [id number] Phone: [phone]   Fax:  [phone]  Name: Paula Luna MRN: [id number] Date of Birth: 06/07/1925

## 2019-08-12 ENCOUNTER — Telehealth: Payer: Self-pay | Admitting: Internal Medicine

## 2019-08-12 DIAGNOSIS — I10 Essential (primary) hypertension: Secondary | ICD-10-CM

## 2019-08-12 NOTE — Telephone Encounter (Signed)
Please call to discuss BP and Xarelto, and also to discuss Lisinopril doseage increase.

## 2019-08-12 NOTE — Telephone Encounter (Signed)
Spoke with patient's daughter, ok per DPR. Patient's BP still running in the 180's/90's. They are using a new arm BP cuff at home. States patient is feeling great. No chest pain, dizziness or shortness of breath.  Patient takes her metoprolol and lisinopril at night before bedtime. BP is taken the next day in the morning.  On 11/17 at rehab, daughter says the therapist said the BP was elevated and it was documented in Epic as 160/82. She also says she is having trouble splitting the 20 mg tablet of lisinopril in half to get the 30 mg (1.5 tablets).  She wonders if patient sometimes doesn't get the whole dose.   Telephone entry from 07/03/19 from Dr End said if remain elevated, may need to increase to 40 mg.  Advised should be ok to do so and will route to Dr End for confirmation.   She also needs about 10 pills of Xarelto to get patient through the end of the year. We do not have the samples at this time. Will call the drug rep for samples tomorrow.

## 2019-08-13 ENCOUNTER — Ambulatory Visit: Payer: PPO

## 2019-08-13 MED ORDER — LISINOPRIL 40 MG PO TABS: 40.0000 mg | ORAL_TABLET | Freq: Every day | ORAL | 2 refills | Status: DC

## 2019-08-13 NOTE — Telephone Encounter (Signed)
Given continued elevation in blood pressure, I recommend increasing lisinopril to 40 mg daily and checking a BMP in 1-2 weeks.  Nelva Bush, MD [facility] Pager: [phone]

## 2019-08-13 NOTE — Telephone Encounter (Signed)
Call to patient to make her aware of POC from Dr. Saunders Revel.   Rx sent to pharmacy and labs placed for [facility] in 1-2 weeks.   No further questions at this time.

## 2019-08-13 NOTE — Telephone Encounter (Signed)
Call attempted. Line busy. Will try back at a later time.

## 2019-08-22 ENCOUNTER — Telehealth: Payer: Self-pay | Admitting: Internal Medicine

## 2019-08-22 MED ORDER — RIVAROXABAN 15 MG PO TABS: 15.0000 mg | ORAL_TABLET | Freq: Every day | ORAL | 1 refills | Status: DC

## 2019-08-22 NOTE — Telephone Encounter (Signed)
Please review for refill, Thanks !  

## 2019-08-22 NOTE — Telephone Encounter (Signed)
*  STAT* If patient is at the pharmacy, call can be transferred to refill team.   1. Which medications need to be refilled? (please list name of each medication and dose if known) xarelto 15 mg po q d   2. Which pharmacy/location (including street and city if local pharmacy) is medication to be sent to? [place]   3. Do they need a 30 day or 90 day supply? [place]

## 2019-08-22 NOTE — Telephone Encounter (Signed)
Patient calling the office for samples of medication:   1.  What medication and dosage are you requesting samples for? xarelto 15 mg po d   2.  Are you currently out of this medication?  Needs 10 tablets to make it to January

## 2019-08-22 NOTE — Telephone Encounter (Signed)
Xarelto 15mg  refill request received. Pt is 85 years old, weight-56kg, Crea-0.81 on 07/01/2019, last seen by Dr. Saunders Revel on 06/12/2019, Diagnosis-Afib, CrCl-39.8ml/min; Dose is appropriate based on dosing criteria. Will send in requested refill to pharmacy.

## 2019-08-23 ENCOUNTER — Encounter: Payer: Self-pay | Admitting: *Deleted

## 2019-08-23 NOTE — Telephone Encounter (Signed)
I called and spoke with the patient's daughter, Paula Luna (ok per [facility]). I have advised her of Dr. Darnelle Bos recommendations to to possibly switch to warfarin until the new year, but Paula Luna is adamant the patient will not want to do that.  I advised Paula Luna, that the option is that we do have some Xarelto 2.5 mg tablets. We could have the patient take Xarelto 2.5 mg- take 6 tablets (15 mg) by mouth once daily x 10 days/ until the new year.   Per Paula Luna, she is agreeable with this. She sets up the patient's pills and will make sure they are taken correctly. I have advised Paula Luna that all 6 of the Xarelto 2.5 mg tablets should be taken at one time for the 10 day window she will be without her regular Xarelto 15 mg tablets.  Again Paula Luna is agreeable and voices understanding of the above recommendations. Paula Luna is aware that the patient's samples will be at our front desk for pick up.  Samples Given: Xarelto 2.5 mg Lot: [id number] Exp: 5/22 # 5 bottles (14 tablets each) dispensed

## 2019-08-23 NOTE — Telephone Encounter (Signed)
The only other option would be to switch her to warfarin, as other NOAC's (Pradaxa and Xarelto) would likely be the same cost.  Once the new year begins, her copay should drop back to the non-doughnut hole level.  Nelva Bush, MD [facility]

## 2019-08-23 NOTE — Telephone Encounter (Signed)
Called patient.  Patient daughter answered the phone.  Made them aware that we are currently out of Xarelto 15MG  samples.  She stated that the patient will be 10 days short of Xarelto 15MG . Patient will be out of Xarelto December 20th 2020  She stated that she has been trying to help her mother with this medication but she has not lost her job as well and can no longer help her. She also has tried to apply for patient assistance but does not qualify.   [facility] called her yesterday and for a 30 days supply for Xarelto will be $332.   Can you please advise if there is anything we can do to help.

## 2019-08-27 ENCOUNTER — Telehealth: Payer: Self-pay | Admitting: Internal Medicine

## 2019-08-27 ENCOUNTER — Other Ambulatory Visit
Admission: RE | Admit: 2019-08-27 | Discharge: 2019-08-27 | Disposition: A | Discharge status: Home / Self Care | Payer: PPO | Source: Ambulatory Visit | Attending: Internal Medicine | Admitting: Internal Medicine

## 2019-08-27 DIAGNOSIS — I10 Essential (primary) hypertension: Secondary | ICD-10-CM

## 2019-08-27 LAB — HEPATIC FUNCTION PANEL
ALT: 11 U/L (ref 0–44)
AST: 19 U/L (ref 15–41)
Albumin: 4 g/dL (ref 3.5–5.0)
Alkaline Phosphatase: 74 U/L (ref 38–126)
Bilirubin, Direct: 0.1 mg/dL (ref 0.0–0.2)
Indirect Bilirubin: 0.7 mg/dL (ref 0.3–0.9)
Total Bilirubin: 0.8 mg/dL (ref 0.3–1.2)
Total Protein: 7.2 g/dL (ref 6.5–8.1)

## 2019-08-27 LAB — BASIC METABOLIC PANEL
Anion gap: 9 (ref 5–15)
BUN: 15 mg/dL (ref 8–23)
CO2: 27 mmol/L (ref 22–32)
Calcium: 9.3 mg/dL (ref 8.9–10.3)
Chloride: 104 mmol/L (ref 98–111)
Creatinine, Ser: 0.75 mg/dL (ref 0.44–1.00)
GFR calc Af Amer: >60 mL/min (ref >60)
GFR calc non Af Amer: >60 mL/min (ref >60)
Glucose, Bld: 92 mg/dL (ref 70–99)
Potassium: 3.7 mmol/L (ref 3.5–5.1)
Sodium: 140 mmol/L (ref 135–145)

## 2019-08-27 NOTE — Telephone Encounter (Signed)
SPoke with [name], patient's DPR. States patient's BP has been running 170-180's over 90's. They have a new BP cuff over the past months and she takes it on herself as well to make sure its running consistent. It seems to be consistent. Patient denies any symptoms. Med list is correct - Lisinopril 40 mg daily and metoprolol succinate 25 mg daily. She takes both at bedtime and they make her sleepy and she does to sleep. Daughter takes patient's BP in the early afternoon time.  Routing to Dr End for further advice.

## 2019-08-27 NOTE — Telephone Encounter (Signed)
Please have her see an APP at her convenience.  Thanks.  Nelva Bush, MD [facility]

## 2019-08-27 NOTE — Telephone Encounter (Signed)
Patient daughter [name] stopped by to pick up samples States that patients BP readings have been high   180s over 90s No official readings with her at this time but would like to speak with the nurse Please call to discuss

## 2019-08-28 NOTE — Telephone Encounter (Signed)
Daughter calling in to confirm Friday appointment. Daughter also states that over the past two days patients BP has decreased. Patient has tried taking lisinopril at noon and BP is in 170/90 range.  Patients daughter just wanted to call in to let the nurse know this information.

## 2019-08-28 NOTE — Telephone Encounter (Signed)
Spoke with daughter. Offered appointment with Dr End this afternoon. Daughter is leery as to how the weather will be at that time, potential for ice/rain. Opening on Friday with Ignacia Bayley, NP.  She is agreeable to that appointment for patient. She will call back later today if they are willing to venture out this afternoon to see Dr End.

## 2019-08-29 ENCOUNTER — Telehealth: Payer: Self-pay | Admitting: Family Medicine

## 2019-08-29 MED ORDER — FLUTICASONE PROPIONATE 50 MCG/ACT NA SUSP: 2.0000 | Freq: Every day | NASAL | 5 refills | Status: DC

## 2019-08-29 NOTE — Telephone Encounter (Signed)
Refill sent as requested. 

## 2019-08-29 NOTE — Telephone Encounter (Signed)
Received a call from [place] is requesting a refill of Flonase, patient ran out and needs this refilled.  Pt has been using OTC (Equate brand), works well but Pt is requesting a RX for the [facility].   Please send to [facility] for a 90-day supply.

## 2019-08-30 ENCOUNTER — Encounter: Payer: Self-pay | Admitting: Nurse Practitioner

## 2019-08-30 ENCOUNTER — Other Ambulatory Visit: Payer: Self-pay

## 2019-08-30 ENCOUNTER — Ambulatory Visit (INDEPENDENT_AMBULATORY_CARE_PROVIDER_SITE_OTHER): Payer: PPO | Admitting: Nurse Practitioner

## 2019-08-30 VITALS — BP 170/90 | HR 68 | Ht 59.0 in | Wt 122.8 lb

## 2019-08-30 DIAGNOSIS — I4821 Permanent atrial fibrillation: Secondary | ICD-10-CM

## 2019-08-30 DIAGNOSIS — I10 Essential (primary) hypertension: Secondary | ICD-10-CM | POA: Diagnosis not present

## 2019-08-30 MED ORDER — AMLODIPINE BESYLATE 5 MG PO TABS: 2.5000 mg | ORAL_TABLET | Freq: Every day | ORAL | 3 refills | Status: DC

## 2019-08-30 NOTE — Progress Notes (Signed)
Office Visit    Patient Name: Paula Luna Date of Encounter: 08/30/2019  Primary Care Provider:  Hannah Beat, MD Primary Cardiologist:  Yvonne Kendall, MD  Chief Complaint    85 y/o ? w/ a h/o permanent afib, HTN, osteoporosis, anemia, and ischemic bowel s/p small bowel resection, who presents for f/u 2/2 HTN.  Past Medical History    Past Medical History:  Diagnosis Date  . Anemia   . Back pain   . Hypertension 12/25/2013  . Ischemic bowel disease ([facility])    a. 03/2017 abd pain-->internal herina and ischemic jejunum s/p ex lap and small bowel resection.  . Macular degeneration of left eye 01/06/2015  . Osteoporosis   . Permanent atrial fibrillation ([facility])    a. First noted 03/2017;  b. 07/2017 Echo: EF 60-65%, no rwma, mild MR, mildly to mod dil LA; c. 07/2017 48hr Holter: persistent Afib, avg HR of 66 (range 39-140 bpm);  d. CHA2DS2VASc = 4-->Xarelto initiated 07/2017.    Past Surgical History:  Procedure Laterality Date  . CLOSED REDUCTION PROXIMAL HUMERUS FRACTURE Left 2017  . LAPAROSCOPIC SMALL BOWEL RESECTION    . LAPAROTOMY N/A 03/18/2017   Procedure: EXPLORATORY LAPAROTOMY, SMALL BOWEL RESECTION;  Surgeon: Leafy Ro, MD;  Location: [facility] ORS;  Service: General;  Laterality: N/A;  . TOTAL ABDOMINAL HYSTERECTOMY      Allergies  Allergies  Allergen Reactions  . Latex Rash  . Tetanus Toxoid Anaphylaxis    REACTION: anaphylaxis  . Alendronate Sodium     REACTION: passed out    History of Present Illness    85 year old female with the above past medical history including hypertension, anemia, osteoporosis, macular degeneration of the left eye, ischemic bowel status post small bowel resection, and permanent atrial fibrillation.  Atrial fibrillation was diagnosed in July 2018, when she was admitted to [facility] with abdominal pain found to have an inguinal hernia with ischemic jejunum for which, exploratory laparotomy and small bowel resection were  performed.  Following recovery from surgery, she had fatigue and dyspnea and was referred to primary care who again found atrial fibrillation she was subsequently for to cardiology in October 2018.  She has been anticoagulated with Xarelto and rate controlled on beta-blocker therapy.  She has never had an interest in pursuing cardioversion or rhythm management.  She was last seen in clinic in September, at which time she noted occasional orthostatic lightheadedness if she got up too quickly but was otherwise doing well.  She and her daughter had noted a rise in blood pressures at that point and pressure was elevated at 160/90 that day, and therefore her lisinopril was titrated to 20 mg daily.  Follow-up lab work was stable and lisinopril was subsequently increased to 40 mg daily due to ongoing elevated blood pressures.  Over the past few weeks, she has been having pressures in the 170s to 190s despite escalation of ACE inhibitor therapy and ongoing beta-blocker therapy.  For that reason, her daughter called our office yesterday and she was added onto the schedule today.  Ms. Genrich has been feeling well.  She continues to work with physical therapy and denies chest pain, dyspnea, palpitations, PND, orthopnea, dizziness, syncope, edema, or early satiety.  She ambulates with a walker and is able to go up and down her stairs in her home without difficulty.  She notes a good appetite.  Home Medications    Prior to Admission medications   Medication Sig Start Date End Date  Taking? Authorizing Provider  docusate sodium (COLACE) 100 MG capsule Take 200 mg by mouth daily.    Yes [provider]  fluticasone (FLONASE) 50 MCG/ACT nasal spray Place 2 sprays into both nostrils daily. 08/29/19  Yes Copland, Karleen HampshireSpencer, MD  lisinopril (ZESTRIL) 40 MG tablet Take 1 tablet (40 mg total) by mouth daily. 08/13/19 11/11/19 Yes End, Cristal Deerhristopher, MD  metoprolol succinate (TOPROL-XL) 25 MG 24 hr tablet Take 1 tablet by  mouth once daily 05/15/19  Yes Copland, Spencer, MD  Multiple Vitamins-Minerals (ICAPS) CAPS Take 1 capsule by mouth 2 (two) times daily.    Yes [provider]  NON FORMULARY Vitamin D 50 mcg, take one daily by mouth   Yes [provider]  Rivaroxaban (XARELTO) 15 MG TABS tablet Take 1 tablet (15 mg total) by mouth daily with supper. 08/22/19  Yes End, Cristal Deerhristopher, MD    Review of Systems    She denies chest pain, palpitations, dyspnea, pnd, orthopnea, n, v, dizziness, syncope, edema, weight gain, or early satiety.  All other systems reviewed and are otherwise negative except as noted above.  Physical Exam    VS:  BP (!) 170/90 (BP Location: Left Arm, Patient Position: Sitting, Cuff Size: Normal)   Pulse 68   Ht 4\' 11"  (1.499 m)   Wt 122 lb 12 oz (55.7 kg)   BMI 24.79 kg/m  , BMI Body mass index is 24.79 kg/m. GEN: Well nourished, well developed, in no acute distress. HEENT: normal. Neck: Supple, no JVD, carotid bruits, or masses. Cardiac: Irregularly, irregular, no murmurs, rubs, or gallops. No clubbing, cyanosis, edema.  Radials/PT 2+ and equal bilaterally.  Respiratory:  Respirations regular and unlabored, clear to auscultation bilaterally. GI: Soft, nontender, nondistended, BS + x 4. MS: no deformity or atrophy. Skin: warm and dry, no rash. Neuro:  Strength and sensation are intact. Psych: Normal affect.  Accessory Clinical Findings    ECG personally reviewed by me today -atrial fibrillation, 68, PVC, septal infarct - no acute changes.  Lab Results  Component Value Date   WBC 5.7 02/18/2019   HGB 12.9 02/18/2019   HCT 39.5 02/18/2019   MCV 78.7 02/18/2019   PLT 180.0 02/18/2019   Lab Results  Component Value Date   CREATININE 0.75 08/27/2019   BUN 15 08/27/2019   NA 140 08/27/2019   K 3.7 08/27/2019   CL 104 08/27/2019   CO2 27 08/27/2019   Lab Results  Component Value Date   ALT 11 08/27/2019   AST 19 08/27/2019   ALKPHOS 74 08/27/2019    BILITOT 0.8 08/27/2019   Lab Results  Component Value Date   CHOL 169 02/18/2019   HDL 61.30 02/18/2019   LDLCALC 91 02/18/2019   LDLDIRECT 101.2 05/20/2009   TRIG 85.0 02/18/2019   CHOLHDL 3 02/18/2019    Lab Results  Component Value Date   HGBA1C 5.8 02/18/2019    Assessment & Plan    1.  Essential hypertension: Patient with elevated blood pressures at home over the past several months that have not really changed despite titration of lisinopril.  She has been asymptomatic.  Lab work on the 15th showed normal renal function and electrolytes.  She is currently on metoprolol XL 25 mg daily as well as lisinopril 40 mg daily.  I am going to add amlodipine 2.5 mg daily.  Patient and her daughter will continue to follow her blood pressure daily and contact us within the next 10 to 14 days with readings.  Provided that she does not develop any significant side effects or lower extremity swelling related to amlodipine, we can look to titrate this further if necessary.  An alternative might be to switch her from metoprolol to carvedilol.  2.  Permanent atrial fibrillation: She is well rate controlled on beta-blocker therapy.  She is anticoagulated with Xarelto.  Normal H&H in June with stable renal function by lab work earlier this week.  3.  Disposition: Patient's daughter will contact us within the next 2 weeks if pressures remain elevated-greater than 352 systolic.  Plan for office follow-up in 3 months or sooner if necessary.   Murray Hodgkins, NP 08/30/2019, 4:32 PM

## 2019-08-30 NOTE — Patient Instructions (Signed)
Medication Instructions:  1- START Amlodipine Take 0.5 tablets (2.5 mg total) by mouth daily *If you need a refill on your cardiac medications before your next appointment, please call your pharmacy*  Lab Work: None ordered  If you have labs (blood work) drawn today and your tests are completely normal, you will receive your results only by: Marland Kitchen MyChart Message (if you have MyChart) OR . A paper copy in the mail If you have any lab test that is abnormal or we need to change your treatment, we will call you to review the results.  Testing/Procedures: None ordered   Follow-Up: At [facility], you and your health needs are our priority.  As part of our continuing mission to provide you with exceptional heart care, we have created designated Provider Care Teams.  These Care Teams include your primary Cardiologist (physician) and Advanced Practice Providers (APPs -  Physician Assistants and Nurse Practitioners) who all work together to provide you with the care you need, when you need it.  Your next appointment:   As planned in March  Other Instructions Call the clinic in 1 weeks with BP readings.  How to use a home blood pressure monitor. . Be still. Don't smoke, drink caffeinated beverages or exercise within 30 minutes before measuring your blood pressure. . Sit correctly. Sit with your back straight and supported (on a dining chair, rather than a sofa). Your feet should be flat on the floor and your legs should not be crossed. Your arm should be supported on a flat surface (such as a table) with the upper arm at heart level. Make sure the bottom of the cuff is placed directly above the bend of the elbow.  . Measure at the same time every day. It's important to take the readings at the same time each day, such as morning and evening. Take reading approximately 1 hour after BP medications.

## 2019-09-11 ENCOUNTER — Telehealth: Payer: Self-pay | Admitting: Internal Medicine

## 2019-09-11 NOTE — Telephone Encounter (Signed)
Pt c/o BP issue: STAT if pt c/o blurred vision, one-sided weakness or slurred speech  1. What are your last 5 BP readings? 160-170's /80-90's  2. Are you having any other symptoms (ex. Dizziness, headache, blurred vision, passed out)? No   3. What is your BP issue? Recent start of amlodipine 2.5 mg po q d  Daughter calling to report readings and also sometimes pill is hard to split due to size and she is not sure patient is getting full dose.  Can she get a different rx to avoid this.

## 2019-09-12 MED ORDER — AMLODIPINE BESYLATE 5 MG PO TABS: 5.0000 mg | ORAL_TABLET | Freq: Every day | ORAL | 2 refills | Status: DC

## 2019-09-12 NOTE — Telephone Encounter (Signed)
Returned call to daughter. When she goes to split the amlodipine 5 mg pill in half it seems to crumble easily.  Most days she feels patient is getting the correct dose; however, the second half is not salvageable due to being in small powdery pieces. She wonders if it is ok to have the 2.5 mg tablet sent to pharmacy instead.  Additionally, BP is still elevated. 174/80, 192/76, 165/85 12/21 153/83 12/22 162/67 12/23 169/78 12/24 173/74 12/25 160/88 12/26 168/80 12/27 175/76 12/28 174/84 12/30 175/92  Patient saw Sharolyn Douglas, NP on 08/30/19. Routing to him for further advice.

## 2019-09-12 NOTE — Telephone Encounter (Signed)
Spoke with daughter. She verbalized understanding to increase amlodipine to 5 mg daily. Med list updated and new Rx sent to pharmacy. She was appreciative and will continue to monitor patient's BP and let us know any updates.

## 2019-09-12 NOTE — Telephone Encounter (Signed)
Blood pressures are not at goal on 2.5 mg.  Please increase amlodipine to 5 mg daily.

## 2019-09-12 NOTE — Telephone Encounter (Signed)
Daughter returning call, can be reached at [phone]

## 2019-09-12 NOTE — Telephone Encounter (Signed)
No answer. Left message to call back.   

## 2019-09-24 ENCOUNTER — Other Ambulatory Visit: Payer: Self-pay

## 2019-09-24 ENCOUNTER — Telehealth: Payer: Self-pay

## 2019-09-24 ENCOUNTER — Ambulatory Visit: Payer: PPO | Attending: Family Medicine

## 2019-09-24 DIAGNOSIS — R262 Difficulty in walking, not elsewhere classified: Secondary | ICD-10-CM | POA: Insufficient documentation

## 2019-09-24 DIAGNOSIS — M6281 Muscle weakness (generalized): Secondary | ICD-10-CM | POA: Insufficient documentation

## 2019-09-24 DIAGNOSIS — H547 Unspecified visual loss: Secondary | ICD-10-CM | POA: Diagnosis not present

## 2019-09-24 NOTE — Telephone Encounter (Signed)
Patient's daughter left a voicemail stating that she has not heard anything back about the vaccine question.

## 2019-09-24 NOTE — Telephone Encounter (Signed)
[facility] Night - Client Nonclinical Telephone Record AccessNurse Client [facility] Night - Client Client Site [facility] - Night Physician Hannah Beat - MD Contact Type Call Who Is Calling Patient / Member / Family / Caregiver Caller Name Roger Shelter Caller Phone Number [phone] Patient Name Paula Luna Patient DOB [birth year] Call Type Message Only Information Provided Reason for Call Request for General Office Information Initial Comment Caller states she is inquiring about the Covid vaccine and wants to know if would be recommended for her mother to get. Additional Comment Disp. Time Disposition Final User 09/23/2019 5:24:43 PM General Information Provided Yes Claudia Desanctis Call Closed By: Claudia Desanctis Transaction Date/Time: 09/23/2019 5:21:02 PM (ET)

## 2019-09-24 NOTE — Telephone Encounter (Signed)
Yes, agree

## 2019-09-24 NOTE — Therapy (Signed)
[facility] [address] [place], [address] Phone: [phone]   Fax:  [phone]  Physical Therapy Treatment Physical Therapy Progress Note   Dates of reporting period  07/30/19   to   09/24/19  Patient Details  Name: Paula Luna MRN: [id number] Date of Birth: October 06, 1924 No data recorded  Encounter Date: 09/24/2019  PT End of Session - 09/24/19 1842    Visit Number  70    Number of Visits  56    Date for PT Re-Evaluation  07/09/19    Authorization Type  FOTO    Authorization Time Period  8/10    Equipment Utilized During Treatment  Gait belt    Activity Tolerance  Patient tolerated treatment well;Patient limited by fatigue    Behavior During Therapy  [facility] for tasks assessed/performed       Past Medical History:  Diagnosis Date  . Anemia   . Back pain   . Hypertension 12/25/2013  . Ischemic bowel disease ([place])    a. 03/2017 abd pain-->internal herina and ischemic jejunum s/p ex lap and small bowel resection.  . Macular degeneration of left eye 01/06/2015  . Osteoporosis   . Permanent atrial fibrillation ([place])    a. First noted 03/2017;  b. 07/2017 Echo: EF 60-65%, no rwma, mild MR, mildly to mod dil LA; c. 07/2017 48hr Holter: persistent Afib, avg HR of 66 (range 39-140 bpm);  d. CHA2DS2VASc = 4-->Xarelto initiated 07/2017.    Past Surgical History:  Procedure Laterality Date  . CLOSED REDUCTION PROXIMAL HUMERUS FRACTURE Left 2017  . LAPAROSCOPIC SMALL BOWEL RESECTION    . LAPAROTOMY N/A 03/18/2017   Procedure: EXPLORATORY LAPAROTOMY, SMALL BOWEL RESECTION;  Surgeon: Jules Husbands, MD;  Location: [facility] ORS;  Service: General;  Laterality: N/A;  . TOTAL ABDOMINAL HYSTERECTOMY      There were no vitals filed for this visit.  Subjective Assessment - 09/24/19 1840    Subjective  Pt reports she is feeling stressed out upon arrival as she had a difficulty conversation with her family about COVID  vaccine.  She is glad to be back in physical therapy after missing the month of December.    Pertinent History  Small bowel surgery     Limitations  Standing;Walking    Patient Stated Goals  To walk without feeling of falling       Today's Treatment: Pre-tx vitals BP 163/63 HR 62, O2 Sat 100% *PT donned gait belt onto pt for tx session *   NuStep level 4 at 7 minutes Pt required rest break after nustep before continuing tx  17 sec for 5xSTS Pt requires rest break before continuing tx  Ascend/descend stairs x 4 rounds- pt used b/l rail, PT SBA  Ball side toss 2 x 10 each side Pt required rest break before continuing tx  Reciprocal arm swing focusing on arm movement x 10 standing   Walking figure 8's x  PT CGA with gt belt, pt with 1 loss of balance and was able to recover with lateral step  Unable to complete full DGI assessment today due to pt requiring frequent rest breaks during session  Vitals post Tx:   O2 sat 100%, 74  HR,  BP 156/60     PT Education - 09/24/19 1841    Education Details  exercise technique/upright posture during exercises    Person(s) Educated  Patient    Methods  Explanation;Demonstration  PT Long Term Goals - 09/24/19 1853      PT LONG TERM GOAL #1   Baseline  Dependent with exercise performance; 01/01/2018: moderate cueing for performance. 03/28/18 min-mod cueing required; 04/30/18: min cueing required; 09/24/2018: min cueing required; 11/12/2018: Patient performs exercise with little to no cueing.    Time  6    Period  Weeks    Status  Achieved      PT LONG TERM GOAL #2   Title  Patient will improve TUG to under 12 sec to indicate singificant decrease in fall risk.     Baseline  20sec; 01/01/2018: 14sec; 02/12/2018; 11.59 sec;    Time  8    Period  Weeks    Status  Achieved      PT LONG TERM GOAL #3   Title  Patient will improve her ability to perform to 51m/s to indicate increased safety with walking at community ambulation     Baseline  .74m/s; 01/01/2018: .82m/s; 02/12/2018: .1m/s; 03/28/18 .94 m/s; 8/19/9: testing of goals deferred to next session; 06/28/2018: .25m/s; 09/24/2018: deferred; 10/08/2018: .85 m/s; 11/12/2018:  .89 m/s; 02/05/2019: .24m/s; 03/25/2019: .49m/s 05/13/2019: 1.67m/s    Time  8    Period  Weeks    Status  Achieved      PT LONG TERM GOAL #4   Title  Patient will improve 5XSTS to under 16 sec to indicate significant improvement LE functional strength and decrease fall risk.    Baseline  30sec; 01/01/2018: 12 sec; 09/24/19 17 sec    Time  8    Period  Weeks    Status  Revised      PT LONG TERM GOAL #5   Title  Pt will ambulate 586ft in 3 minutes to demonstrate improved endurance with walking into department stores from the parking lot.     Baseline  03/28/18: 360ft; 04/30/18: testing of goals deferred to next session; 06/28/2018: 227ft; 09/24/2018: deferred; 10/08/2018: 274ft; 11/12/2018: 527ft with rollator; 02/04/2019: 522ft    Time  6    Period  Weeks    Status  Achieved      PT LONG TERM GOAL #6   Title  Patient will be able to perform >1052ft in a with a rollator to decrease fall risk with ambulating at community distances.    Baseline  850ft with rollator; 05/13/2019: 1040ft    Time  6    Period  Weeks    Status  Achieved      PT LONG TERM GOAL #7   Title  Patient will improve DGI by 3 points to indicate singificant improvement in dynamic balance and decrease fall risk.    Baseline  05/13/2019:15/2410/20/2020: Deferred taking measurements secondary to time constraints; 07/16/2019 15/24; 09/24/19 deferred due to time constraints as pt required seated rest break after each exercise in today's session    Time  8    Period  Weeks    Status  On-going            Plan - 09/24/19 1845    Clinical Impression Statement  Pt returns to physical therapy after a gap in treatment.  She is an appropriate candidate to resume skilled PT again to address fall risk and impairments in balance, strength,  and activity tolerance.  She continues to report fear of movement and fear of falling during her session.    Stability/Clinical Decision Making  Evolving/Moderate complexity    Rehab Potential  Fair    Clinical Impairments  Affecting Rehab Potential  (+) family support, decreased coormorbities (-) age    PT Frequency  1x / week    PT Duration  6 weeks    PT Treatment/Interventions  Balance training;Neuromuscular re-education;Gait training;Patient/family education;Therapeutic exercise;Therapeutic activities;Functional mobility training;Stair training;Moist Heat;Aquatic Therapy;Electrical Stimulation;Cryotherapy;Manual techniques    PT Next Visit Plan  Attempt more dynamic balance tasks, ambulation over obstacles, ambulation with head turns, stepping response tasks    PT Home Exercise Plan  See education    Consulted and Agree with Plan of Care  Patient    Family Member Consulted  daughter       Patient will benefit from skilled therapeutic intervention in order to improve the following deficits and impairments:  Abnormal gait, Decreased coordination, Decreased endurance, Decreased activity tolerance, Decreased strength, Decreased range of motion, Decreased balance, Difficulty walking  Visit Diagnosis: Difficulty in walking, not elsewhere classified  Muscle weakness (generalized)  Visual impairment     Problem List Patient Active Problem List   Diagnosis Date Noted  . Atrial fibrillation ([facility]) 04/26/2017  . Anemia 04/26/2017  . 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 Macular degeneration of left eye 01/06/2015  . Essential hypertension 12/25/2013  . BACK PAIN 08/02/2010    Ardine Bjork 09/24/2019, 7:07 PM  [facility] [address] [place], [address] Phone: [phone]   Fax:  [phone]  Name: SHARLENE MCCLUSKEY MRN: [id number] Date of Birth:  September 19, 1924

## 2019-09-24 NOTE — Telephone Encounter (Signed)
Left message for Paula Luna that Dr. Patsy Lager does recommend that Paula Luna get the Covid vaccine.

## 2019-10-08 ENCOUNTER — Ambulatory Visit: Payer: PPO

## 2019-10-30 ENCOUNTER — Other Ambulatory Visit: Payer: Self-pay

## 2019-10-30 ENCOUNTER — Ambulatory Visit: Payer: PPO | Attending: Family Medicine

## 2019-10-30 DIAGNOSIS — M6281 Muscle weakness (generalized): Secondary | ICD-10-CM | POA: Diagnosis not present

## 2019-10-30 DIAGNOSIS — R262 Difficulty in walking, not elsewhere classified: Secondary | ICD-10-CM | POA: Diagnosis not present

## 2019-10-30 NOTE — Therapy (Signed)
[facility] [address], [place], [address] Phone: [phone]   Fax:  [phone]  Physical Therapy Treatment  Patient Details  Name: Paula Luna MRN: [id number] Date of Birth: March 20, 1925 No data recorded  Encounter Date: 10/30/2019  PT End of Session - 10/30/19 1439    Visit Number  71    Number of Visits  67    Date for PT Re-Evaluation  07/09/19    Authorization Type  FOTO    Authorization Time Period  8/10    PT Start Time  1433    PT Stop Time  1515    PT Time Calculation (min)  42 min    Equipment Utilized During Treatment  Gait belt    Activity Tolerance  Patient tolerated treatment well;Patient limited by fatigue    Behavior During Therapy  WFL for tasks assessed/performed       Past Medical History:  Diagnosis Date  . Anemia   . Back pain   . Hypertension 12/25/2013  . Ischemic bowel disease ([facility])    a. 03/2017 abd pain-->internal herina and ischemic jejunum s/p ex lap and small bowel resection.  . Macular degeneration of left eye 01/06/2015  . Osteoporosis   . Permanent atrial fibrillation ([facility])    a. First noted 03/2017;  b. 07/2017 Echo: EF 60-65%, no rwma, mild MR, mildly to mod dil LA; c. 07/2017 48hr Holter: persistent Afib, avg HR of 66 (range 39-140 bpm);  d. CHA2DS2VASc = 4-->Xarelto initiated 07/2017.    Past Surgical History:  Procedure Laterality Date  . CLOSED REDUCTION PROXIMAL HUMERUS FRACTURE Left 2017  . LAPAROSCOPIC SMALL BOWEL RESECTION    . LAPAROTOMY N/A 03/18/2017   Procedure: EXPLORATORY LAPAROTOMY, SMALL BOWEL RESECTION;  Surgeon: Leafy Ro, MD;  Location: [facility] ORS;  Service: General;  Laterality: N/A;  . TOTAL ABDOMINAL HYSTERECTOMY      There were no vitals filed for this visit.  Subjective Assessment - 10/30/19 1437    Subjective  Pt reports feeling good and reports positive blood pressure changes at MD yesterday following change in medication.     Pertinent History  Small bowel surgery     Limitations  Standing;Walking    Patient Stated Goals  To walk without feeling of falling     Currently in Pain?  No/denies       TREATMENT  Vitals: BP 139/79   Warmup: Nustep level 5 x 7 min monitored patient for symptoms  Pt reports that she is breathing harder but it's "not too bad". No abnormal DOE noted.  TE Amb 100 ft CGA without rollator - no LOB Stairs descending/ascending with BUE rail assist CGA Swing basics (lateral step R, L, weight shift to rear) x 5 min HHA Box steps x 2 min HHA STS 2 x 5  TE for reduced fear of movement and improved LE strength and activity tolerance      PT Education - 10/30/19 1439    Education provided  Yes    Education Details  exercise technique/form    Person(s) Educated  Patient    Methods  Explanation;Demonstration;Tactile cues;Verbal cues    Comprehension  Verbalized understanding;Returned demonstration;Verbal cues required;Tactile cues required          PT Long Term Goals - 09/24/19 1853      PT LONG TERM GOAL #1   Baseline  Dependent with exercise performance; 01/01/2018: moderate cueing for performance. 03/28/18 min-mod cueing required; 04/30/18: min  cueing required; 09/24/2018: min cueing required; 11/12/2018: Patient performs exercise with little to no cueing.    Time  6    Period  Weeks    Status  Achieved      PT LONG TERM GOAL #2   Title  Patient will improve TUG to under 12 sec to indicate singificant decrease in fall risk.     Baseline  20sec; 01/01/2018: 14sec; 02/12/2018; 11.59 sec;    Time  8    Period  Weeks    Status  Achieved      PT LONG TERM GOAL #3   Title  Patient will improve her ability to perform 83mWT to 66m/s to indicate increased safety with walking at community ambulation    Baseline  .4m/s; 01/01/2018: .73m/s; 02/12/2018: .24m/s; 03/28/18 .94 m/s; 8/19/9: testing of goals deferred to next session; 06/28/2018: .40m/s; 09/24/2018: deferred; 10/08/2018: .85 m/s;  11/12/2018:  .89 m/s; 02/05/2019: .50m/s; 03/25/2019: .43m/s 05/13/2019: 1.26m/s    Time  8    Period  Weeks    Status  Achieved      PT LONG TERM GOAL #4   Title  Patient will improve 5XSTS to under 16 sec to indicate significant improvement LE functional strength and decrease fall risk.    Baseline  30sec; 01/01/2018: 12 sec; 09/24/19 17 sec    Time  8    Period  Weeks    Status  Revised      PT LONG TERM GOAL #5   Title  Pt will ambulate 575ft in 3 minutes to demonstrate improved endurance with walking into department stores from the parking lot.     Baseline  03/28/18: 367ft; 04/30/18: testing of goals deferred to next session; 06/28/2018: 235ft; 09/24/2018: deferred; 10/08/2018: 228ft; 11/12/2018: 54ft with rollator; 02/04/2019: 541ft    Time  6    Period  Weeks    Status  Achieved      PT LONG TERM GOAL #6   Title  Patient will be able to perform >1057ft in a 43minWT with a rollator to decrease fall risk with ambulating at community distances.    Baseline  821ft with rollator; 05/13/2019: 1032ft    Time  6    Period  Weeks    Status  Achieved      PT LONG TERM GOAL #7   Title  Patient will improve DGI by 3 points to indicate singificant improvement in dynamic balance and decrease fall risk.    Baseline  05/13/2019:15/2410/20/2020: Deferred taking measurements secondary to time constraints; 07/16/2019 15/24; 09/24/19 deferred due to time constraints as pt required seated rest break after each exercise in today's session    Time  8    Period  Weeks    Status  On-going            Plan - 10/30/19 1455    Clinical Impression Statement  Note marked improvement in activity tolerance and reduced fear of falling compared to last session. Pt requires fewer and shorter rest breaks and BP is markedly improved following new medication. Deficits remain in strength and postural control with early fatigue necessitating min A for balance with challenging therex. Patient will benefit from skilled PT to  continue to progress towards goals and reduce fall risk and kinesiophobia.    Personal Factors and Comorbidities  Age    Stability/Clinical Decision Making  Evolving/Moderate complexity    Rehab Potential  Fair    Clinical Impairments Affecting Rehab Potential  (+) family support, decreased coormorbities (-)  age    PT Frequency  1x / week    PT Duration  6 weeks    PT Treatment/Interventions  Balance training;Neuromuscular re-education;Gait training;Patient/family education;Therapeutic exercise;Therapeutic activities;Functional mobility training;Stair training;Moist Heat;Aquatic Therapy;Electrical Stimulation;Cryotherapy;Manual techniques    PT Next Visit Plan  Attempt more dynamic balance tasks, ambulation over obstacles, ambulation with head turns, stepping response tasks    PT Home Exercise Plan  See education    Consulted and Agree with Plan of Care  Patient    Family Member Consulted  daughter       Patient will benefit from skilled therapeutic intervention in order to improve the following deficits and impairments:  Abnormal gait, Decreased coordination, Decreased endurance, Decreased activity tolerance, Decreased strength, Decreased range of motion, Decreased balance, Difficulty walking  Visit Diagnosis: Difficulty in walking, not elsewhere classified  Muscle weakness (generalized)     Problem List Patient Active Problem List   Diagnosis Date Noted  . Atrial fibrillation ([facility]) 04/26/2017  . Anemia 04/26/2017  . 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 Macular degeneration of left eye 01/06/2015  . Essential hypertension 12/25/2013  . BACK PAIN 08/02/2010    Janee Morn, SPT 10/30/2019, 3:09 PM Max Fickle, PT, DPT    [facility] [address] [place], [address] Phone: [phone]   Fax:  [phone]  Name: SHAMS FILL MRN: [id number] Date  of Birth: [birth year]

## 2019-11-10 ENCOUNTER — Other Ambulatory Visit: Payer: Self-pay | Admitting: Family Medicine

## 2019-11-12 ENCOUNTER — Ambulatory Visit: Payer: PPO | Attending: Family Medicine

## 2019-11-12 ENCOUNTER — Other Ambulatory Visit: Payer: Self-pay

## 2019-11-12 DIAGNOSIS — R262 Difficulty in walking, not elsewhere classified: Secondary | ICD-10-CM

## 2019-11-12 DIAGNOSIS — M6281 Muscle weakness (generalized): Secondary | ICD-10-CM | POA: Diagnosis not present

## 2019-11-12 NOTE — Therapy (Signed)
[facility] [address] [place], [address] Phone: [phone]   Fax:  [phone]  Physical Therapy Treatment  Patient Details  Name: Paula Luna MRN: [id number] Date of Birth: [birth year] No data recorded  Encounter Date: 11/12/2019  PT End of Session - 11/12/19 1345    Visit Number  72    Number of Visits  72    Date for PT Re-Evaluation  11/12/19    Authorization Type  FOTO    Authorization Time Period  9/10    PT Start Time  1345    PT Stop Time  1430    PT Time Calculation (min)  45 min    Equipment Utilized During Treatment  Gait belt    Activity Tolerance  Patient tolerated treatment well;Patient limited by fatigue    Behavior During Therapy  WFL for tasks assessed/performed       Past Medical History:  Diagnosis Date  . Anemia   . Back pain   . Hypertension 12/25/2013  . Ischemic bowel disease (HCC)    a. 03/2017 abd pain-->internal herina and ischemic jejunum s/p ex lap and small bowel resection.  . Macular degeneration of left eye 01/06/2015  . Osteoporosis   . Permanent atrial fibrillation (HCC)    a. First noted 03/2017;  b. 07/2017 Echo: EF 60-65%, no rwma, mild MR, mildly to mod dil LA; c. 07/2017 48hr Holter: persistent Afib, avg HR of 66 (range 39-140 bpm);  d. CHA2DS2VASc = 4-->Xarelto initiated 07/2017.    Past Surgical History:  Procedure Laterality Date  . CLOSED REDUCTION PROXIMAL HUMERUS FRACTURE Left 2017  . LAPAROSCOPIC SMALL BOWEL RESECTION    . LAPAROTOMY N/A 03/18/2017   Procedure: EXPLORATORY LAPAROTOMY, SMALL BOWEL RESECTION;  Surgeon: Leafy Ro, MD;  Location: [facility] ORS;  Service: General;  Laterality: N/A;  . TOTAL ABDOMINAL HYSTERECTOMY      There were no vitals filed for this visit.  Subjective Assessment - 11/12/19 1349    Subjective  Patient reports no major changes since the previous session. Patient states she's been walking around the house.     Pertinent History  Small bowel surgery     Limitations  Standing;Walking    Patient Stated Goals  To walk without feeling of falling     Currently in Pain?  No/denies       Warm[place]: Nustep level 5 x 7 min monitored patient for symptoms   Pt reports that she is breathing harder but it's "not too bad". No abnormal DOE noted.   TE Amb 160 ft CGA without rollator - no LOB Stairs descending/ascending with BUE rail assist CGA - x 3  Side stepping with B hand holding - x 12 B Back stepping with B hand holding - x 12 STS - x10 Front, sideways, back steps - 4 x 4 each position  TE for reduced fear of movement and improved LE strength and activity tolerance  PT Education - 11/12/19 1353    Education provided  Yes    Education Details  form/technique with exercise    Person(s) Educated  Patient    Methods  Demonstration;Explanation    Comprehension  Verbalized understanding;Returned demonstration          PT Long Term Goals - 09/24/19 1853      PT LONG TERM GOAL #1   Baseline  Dependent with exercise performance; 01/01/2018: moderate cueing for performance. 03/28/18 min-mod cueing required; 04/30/18: min cueing required;  09/24/2018: min cueing required; 11/12/2018: Patient performs exercise with little to no cueing.    Time  6    Period  Weeks    Status  Achieved      PT LONG TERM GOAL #2   Title  Patient will improve TUG to under 12 sec to indicate singificant decrease in fall risk.     Baseline  20sec; 01/01/2018: 14sec; 02/12/2018; 11.59 sec;    Time  8    Period  Weeks    Status  Achieved      PT LONG TERM GOAL #3   Title  Patient will improve her ability to perform 29mWT to 62m/s to indicate increased safety with walking at community ambulation    Baseline  .41m/s; 01/01/2018: .64m/s; 02/12/2018: .48m/s; 03/28/18 .94 m/s; 8/19/9: testing of goals deferred to next session; 06/28/2018: .38m/s; 09/24/2018: deferred; 10/08/2018: .85 m/s; 11/12/2018:  .89 m/s; 02/05/2019: .55m/s; 03/25/2019: .23m/s  05/13/2019: 1.92m/s    Time  8    Period  Weeks    Status  Achieved      PT LONG TERM GOAL #4   Title  Patient will improve 5XSTS to under 16 sec to indicate significant improvement LE functional strength and decrease fall risk.    Baseline  30sec; 01/01/2018: 12 sec; 09/24/19 17 sec    Time  8    Period  Weeks    Status  Revised      PT LONG TERM GOAL #5   Title  Pt will ambulate 555ft in 3 minutes to demonstrate improved endurance with walking into department stores from the parking lot.     Baseline  03/28/18: 374ft; 04/30/18: testing of goals deferred to next session; 06/28/2018: 284ft; 09/24/2018: deferred; 10/08/2018: 214ft; 11/12/2018: 572ft with rollator; 02/04/2019: 549ft    Time  6    Period  Weeks    Status  Achieved      PT LONG TERM GOAL #6   Title  Patient will be able to perform >1061ft in a 41minWT with a rollator to decrease fall risk with ambulating at community distances.    Baseline  886ft with rollator; 05/13/2019: 103ft    Time  6    Period  Weeks    Status  Achieved      PT LONG TERM GOAL #7   Title  Patient will improve DGI by 3 points to indicate singificant improvement in dynamic balance and decrease fall risk.    Baseline  05/13/2019:15/2410/20/2020: Deferred taking measurements secondary to time constraints; 07/16/2019 15/24; 09/24/19 deferred due to time constraints as pt required seated rest break after each exercise in today's session    Time  8    Period  Weeks    Status  On-going            Plan - 11/12/19 1401    Clinical Impression Statement  Patient demonstrates increased faitgue today most notably after the nustep and walking requiring sitting rest breaks throughout the session. Continued to focus on general strengthening and balance exercises throuhgout today's session will continue to progress as per patient tolerance. Patient will benefit from further skilled therapy to return to prior level of function.    Personal Factors and Comorbidities  Age     Stability/Clinical Decision Making  Evolving/Moderate complexity    Rehab Potential  Fair    Clinical Impairments Affecting Rehab Potential  (+) family support, decreased coormorbities (-) age    PT Frequency  1x / week    PT  Duration  6 weeks    PT Treatment/Interventions  Balance training;Neuromuscular re-education;Gait training;Patient/family education;Therapeutic exercise;Therapeutic activities;Functional mobility training;Stair training;Moist Heat;Aquatic Therapy;Electrical Stimulation;Cryotherapy;Manual techniques    PT Next Visit Plan  Attempt more dynamic balance tasks, ambulation over obstacles, ambulation with head turns, stepping response tasks    PT Home Exercise Plan  See education    Consulted and Agree with Plan of Care  Patient    Family Member Consulted  daughter       Patient will benefit from skilled therapeutic intervention in order to improve the following deficits and impairments:  Abnormal gait, Decreased coordination, Decreased endurance, Decreased activity tolerance, Decreased strength, Decreased range of motion, Decreased balance, Difficulty walking  Visit Diagnosis: Difficulty in walking, not elsewhere classified  Muscle weakness (generalized)     Problem List Patient Active Problem List   Diagnosis Date Noted  . Atrial fibrillation (HCC) 04/26/2017  . Anemia 04/26/2017  . 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 Macular degeneration of left eye 01/06/2015  . Essential hypertension 12/25/2013  . BACK PAIN 08/02/2010    Myrene Galas, PT DPT 11/12/2019, 2:29 PM  [place] [facility] [address], [place], [address] Phone: [phone]   Fax:  [phone]  Name: Paula Luna MRN: [id number] Date of Birth: 03-Apr-1925

## 2019-11-19 ENCOUNTER — Telehealth: Payer: Self-pay | Admitting: Internal Medicine

## 2019-11-19 NOTE — Telephone Encounter (Signed)
Spoke with patient's daughter, ok per DPR.  She verbalized understanding that Dr End recommend's patient get the Covid-19 vaccine. She was very appreciative and this will help them make their decision.

## 2019-11-19 NOTE — Telephone Encounter (Signed)
Patient daughter returning call.  Please try after 430 or tomorrow to reach.

## 2019-11-19 NOTE — Telephone Encounter (Signed)
No answer. Left message to call back.   

## 2019-11-19 NOTE — Telephone Encounter (Signed)
I recommend that she receive the COVID-19 vaccine.  Yvonne Kendall, MD [facility]

## 2019-11-19 NOTE — Telephone Encounter (Signed)
Please call cell with Response patient daughter is leaving home

## 2019-11-19 NOTE — Telephone Encounter (Signed)
Patient daughter states patient would like recommendation for patient getting the vaccine for COVID. Please call and advise.

## 2019-11-25 NOTE — Telephone Encounter (Signed)
Patients daughter calling in regarding medication and the vaccine. Patient is going Friday at 3 pm to receive vaccine. Patient also takes lisinopril, metoprolol and amlodipine at the same time as the shot. Patients daughter is calling to check on any possible interactions  Please advise

## 2019-11-25 NOTE — Telephone Encounter (Signed)
Spoke with daughter. Advised it was ok to take her normal daily medications a few hours prior to the vaccine time that afternoon. Advised ok to continue Xarelto that evening and all medications as usual on the day of the Covid-19 vaccine. She was appreciative.

## 2019-11-26 ENCOUNTER — Other Ambulatory Visit: Payer: Self-pay

## 2019-11-26 ENCOUNTER — Ambulatory Visit: Payer: PPO

## 2019-11-26 DIAGNOSIS — R262 Difficulty in walking, not elsewhere classified: Secondary | ICD-10-CM | POA: Diagnosis not present

## 2019-11-26 DIAGNOSIS — M6281 Muscle weakness (generalized): Secondary | ICD-10-CM

## 2019-11-26 NOTE — Therapy (Addendum)
[place] [facility] [address], [place], [address] Phone: [phone]   Fax:  [phone]  Physical Therapy Treatment/Progress Note  Patient Details  Name: Paula Luna MRN: [id number] Date of Birth: 02-02-1925 No data recorded  Reporting Period: 09/24/2019 - 11/22/2019 Encounter Date: 11/26/2019  PT End of Session - 11/26/19 1316    Visit Number  33    Number of Visits  80    Date for PT Re-Evaluation  11/12/19    Authorization Type  FOTO    Authorization Time Period  10/10    PT Start Time  1305    PT Stop Time  1345    PT Time Calculation (min)  40 min    Equipment Utilized During Treatment  Gait belt    Activity Tolerance  Patient tolerated treatment well;Patient limited by fatigue    Behavior During Therapy  [facility] for tasks assessed/performed       Past Medical History:  Diagnosis Date  . Anemia   . Back pain   . Hypertension 12/25/2013  . Ischemic bowel disease ([place])    a. 03/2017 abd pain-->internal herina and ischemic jejunum s/p ex lap and small bowel resection.  . Macular degeneration of left eye 01/06/2015  . Osteoporosis   . Permanent atrial fibrillation ([place])    a. First noted 03/2017;  b. 07/2017 Echo: EF 60-65%, no rwma, mild MR, mildly to mod dil LA; c. 07/2017 48hr Holter: persistent Afib, avg HR of 66 (range 39-140 bpm);  d. CHA2DS2VASc = 4-->Xarelto initiated 07/2017.    Past Surgical History:  Procedure Laterality Date  . CLOSED REDUCTION PROXIMAL HUMERUS FRACTURE Left 2017  . LAPAROSCOPIC SMALL BOWEL RESECTION    . LAPAROTOMY N/A 03/18/2017   Procedure: EXPLORATORY LAPAROTOMY, SMALL BOWEL RESECTION;  Surgeon: Jules Husbands, MD;  Location: [facility] ORS;  Service: General;  Laterality: N/A;  . TOTAL ABDOMINAL HYSTERECTOMY      There were no vitals filed for this visit.  Subjective Assessment - 11/26/19 1313    Subjective  Patient states she has been walking around the house. Patient  states she's been walking for exercise when able.    Pertinent History  Small bowel surgery     Limitations  Standing;Walking    Patient Stated Goals  To walk without feeling of falling     Currently in Pain?  No/denies         TREATMENT Therapeutic Exercise  Sit to stands with focus on speed from a standard height chair -- x 2 x 5  Walking head turns with focus on performing with normal speed head turns up/down -- x 44ft  Stepping over items on ground for balance -- x 5 with minA from therapist  Stop and turns with walking -- x 10  Nustep with patient in sitting seat level 6 level 5 with focus on improving speed and increasing power output (Watts) -- x 90min  Step taps onto balance stone to improve single leg balance -- x 10 B  Performed exercises to improve standing balance and LE strength    PT Education - 11/26/19 1315    Education provided  Yes    Education Details  form/technique with exercise    Person(s) Educated  Patient    Methods  Explanation;Demonstration    Comprehension  Verbalized understanding;Returned demonstration          PT Long Term Goals - 11/26/19 1317      PT LONG  TERM GOAL #1   Baseline  Dependent with exercise performance; 01/01/2018: moderate cueing for performance. 03/28/18 min-mod cueing required; 04/30/18: min cueing required; 09/24/2018: min cueing required; 11/12/2018: Patient performs exercise with little to no cueing.    Time  6    Period  Weeks    Status  Achieved      PT LONG TERM GOAL #2   Title  Patient will improve TUG to under 12 sec to indicate singificant decrease in fall risk.     Baseline  20sec; 01/01/2018: 14sec; 02/12/2018; 11.59 sec;    Time  8    Period  Weeks    Status  Achieved      PT LONG TERM GOAL #3   Title  Patient will improve her ability to perform to 110m/s to indicate increased safety with walking at community ambulation    Baseline  .82m/s; 01/01/2018: .65m/s; 02/12/2018: .73m/s; 03/28/18 .94 m/s; 8/19/9:  testing of goals deferred to next session; 06/28/2018: .79m/s; 09/24/2018: deferred; 10/08/2018: .85 m/s; 11/12/2018:  .89 m/s; 02/05/2019: .6m/s; 03/25/2019: .61m/s 05/13/2019: 1.35m/s    Time  8    Period  Weeks    Status  Achieved      PT LONG TERM GOAL #4   Title  Patient will improve 5XSTS to under 16 sec to indicate significant improvement LE functional strength and decrease fall risk.    Baseline  30sec; 01/01/2018: 12 sec; 09/24/19 17 sec; 11/26/2019: 11 sec with use of hands on knees    Time  8    Period  Weeks    Status  On-going      PT LONG TERM GOAL #5   Title  Pt will ambulate 578ft in 3 minutes to demonstrate improved endurance with walking into department stores from the parking lot.     Baseline  03/28/18: 347ft; 04/30/18: testing of goals deferred to next session; 06/28/2018: 226ft; 09/24/2018: deferred; 10/08/2018: 256ft; 11/12/2018: 512ft with rollator; 02/04/2019: 578ft    Time  6    Period  Weeks    Status  Achieved      PT LONG TERM GOAL #6   Title  Patient will be able to perform >1077ft in a with a rollator to decrease fall risk with ambulating at community distances.    Baseline  827ft with rollator; 05/13/2019: 1037ft    Time  6    Period  Weeks    Status  Achieved      PT LONG TERM GOAL #7   Title  Patient will improve DGI by 5 points to indicate singificant improvement in dynamic balance and decrease fall risk.    Baseline  05/13/2019:15/2410/20/2020: Deferred taking measurements secondary to time constraints; 07/16/2019 15/24; 09/24/19 deferred due to time constraints as pt required seated rest break after each exercise in today's session; 11/26/2019: 18/24    Time  8    Period  Weeks    Status  On-going              Patient will benefit from skilled therapeutic intervention in order to improve the following deficits and impairments:     Visit Diagnosis: Difficulty in walking, not elsewhere classified  Muscle weakness (generalized)     Problem  List Patient Active Problem List   Diagnosis Date Noted  . Atrial fibrillation (HCC) 04/26/2017  . Anemia 04/26/2017  . 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 Macular degeneration of left eye 01/06/2015  .  Essential hypertension 12/25/2013  . BACK PAIN 08/02/2010    Myrene Galas, PT DPT 11/26/2019, 7:16 PM  [place] [facility] AND SPORTS MEDICINE [address], [place], [address] Phone: [phone]   Fax:  [phone]  Name: VONDELL SOWELL MRN: [id number] Date of Birth: 1924-11-01

## 2019-12-10 ENCOUNTER — Other Ambulatory Visit: Payer: Self-pay

## 2019-12-10 ENCOUNTER — Ambulatory Visit: Payer: PPO

## 2019-12-10 DIAGNOSIS — M6281 Muscle weakness (generalized): Secondary | ICD-10-CM

## 2019-12-10 DIAGNOSIS — R262 Difficulty in walking, not elsewhere classified: Secondary | ICD-10-CM

## 2019-12-10 NOTE — Therapy (Signed)
[place] [facility]TS MEDIC[facility] [address] [place], [address] Phone: [phone]   Fax:  [phone]  Physical Therapy Treatment  Patient Details  Name: Paula Luna MRN: [id number] Date of Birth: 1924/09/25 No data recorded  Encounter Date: 12/10/2019  PT End of Session - 12/10/19 1329    Visit Number  74    Number of Visits  80    Date for PT Re-Evaluation  01/28/20    Authorization Type  FOTO    Authorization Time Period  1/10    PT Start Time  1302    PT Stop Time  1345    PT Time Calculation (min)  43 min    Equipment Utilized During Treatment  Gait belt    Activity Tolerance  Patient tolerated treatment well;Patient limited by fatigue    Behavior During Therapy  WFL for tasks assessed/performed       Past Medical History:  Diagnosis Date  . Anemia   . Back pain   . Hypertension 12/25/2013  . Ischemic bowel disease (HCC)    a. 03/2017 abd pain-->internal herina and ischemic jejunum s/p ex lap and small bowel resection.  . Macular degeneration of left eye 01/06/2015  . Osteoporosis   . Permanent atrial fibrillation (HCC)    a. First noted 03/2017;  b. 07/2017 Echo: EF 60-65%, no rwma, mild MR, mildly to mod dil LA; c. 07/2017 48hr Holter: persistent Afib, avg HR of 66 (range 39-140 bpm);  d. CHA2DS2VASc = 4-->Xarelto initiated 07/2017.    Past Surgical History:  Procedure Laterality Date  . CLOSED REDUCTION PROXIMAL HUMERUS FRACTURE Left 2017  . LAPAROSCOPIC SMALL BOWEL RESECTION    . LAPAROTOMY N/A 03/18/2017   Procedure: EXPLORATORY LAPAROTOMY, SMALL BOWEL RESECTION;  Surgeon: Leafy Ro, MD;  Location: [facility] ORS;  Service: General;  Laterality: N/A;  . TOTAL ABDOMINAL HYSTERECTOMY      There were no vitals filed for this visit.  Subjective Assessment - 12/10/19 1313    Subjective  Patient states she has been performing more walking around the house, states she walks around the house 7 times.     Pertinent History  Small bowel surgery     Limitations  Standing;Walking    Patient Stated Goals  To walk without feeling of falling     Currently in Pain?  No/denies         TREATMENT Therapeutic Exercise Nustep with patient in sitting seat level 6 level 5 with focus on improving speed and increasing power output (Watts) -- x Side stepping with therapist support Forward/backward stepping with therapist support with focus on taking large steps Ascending/desending the stairs - x 5 with B UE  Squats in standing with UE support - x 20  Hip abduction in standing - x 15  Performed exercises to improve LE strength and improve balance      PT Education - 12/10/19 1328    Education provided  Yes    Education Details  form/technique with exercise    Person(s) Educated  Patient    Methods  Explanation;Demonstration    Comprehension  Verbalized understanding;Returned demonstration          PT Long Term Goals - 11/26/19 1317      PT LONG TERM GOAL #1   Baseline  Dependent with exercise performance; 01/01/2018: moderate cueing for performance. 03/28/18 min-mod cueing required; 04/30/18: min cueing required; 09/24/2018: min cueing required; 11/12/2018: Patient performs exercise with little to  no cueing.    Time  6    Period  Weeks    Status  Achieved      PT LONG TERM GOAL #2   Title  Patient will improve TUG to under 12 sec to indicate singificant decrease in fall risk.     Baseline  20sec; 01/01/2018: 14sec; 02/12/2018; 11.59 sec;    Time  8    Period  Weeks    Status  Achieved      PT LONG TERM GOAL #3   Title  Patient will improve her ability to perform to 69m/s to indicate increased safety with walking at community ambulation    Baseline  .73m/s; 01/01/2018: .70m/s; 02/12/2018: .7m/s; 03/28/18 .94 m/s; 8/19/9: testing of goals deferred to next session; 06/28/2018: .61m/s; 09/24/2018: deferred; 10/08/2018: .85 m/s; 11/12/2018:  .89 m/s; 02/05/2019: .62m/s; 03/25/2019: .73m/s  05/13/2019: 1.21m/s    Time  8    Period  Weeks    Status  Achieved      PT LONG TERM GOAL #4   Title  Patient will improve 5XSTS to under 16 sec to indicate significant improvement LE functional strength and decrease fall risk.    Baseline  30sec; 01/01/2018: 12 sec; 09/24/19 17 sec; 11/26/2019: 11 sec with use of hands on knees    Time  8    Period  Weeks    Status  On-going      PT LONG TERM GOAL #5   Title  Pt will ambulate 527ft in 3 minutes to demonstrate improved endurance with walking into department stores from the parking lot.     Baseline  03/28/18: 328ft; 04/30/18: testing of goals deferred to next session; 06/28/2018: 233ft; 09/24/2018: deferred; 10/08/2018: 275ft; 11/12/2018: 514ft with rollator; 02/04/2019: 541ft    Time  6    Period  Weeks    Status  Achieved      PT LONG TERM GOAL #6   Title  Patient will be able to perform >1089ft in a with a rollator to decrease fall risk with ambulating at community distances.    Baseline  885ft with rollator; 05/13/2019: 1026ft    Time  6    Period  Weeks    Status  Achieved      PT LONG TERM GOAL #7   Title  Patient will improve DGI by 5 points to indicate singificant improvement in dynamic balance and decrease fall risk.    Baseline  05/13/2019:15/2410/20/2020: Deferred taking measurements secondary to time constraints; 07/16/2019 15/24; 09/24/19 deferred due to time constraints as pt required seated rest break after each exercise in today's session; 11/26/2019: 18/24    Time  8    Period  Weeks    Status  On-going            Plan - 12/10/19 1339    Clinical Impression Statement  Patient demonstrates increased difficulty with performing prolonged walking but continued to focus on performing standing activities to improve standing tolerance. Patient demonstrates decreased static balance with performing marches exercises requiring UE support. Patient continues to have increased balancing difficulties with walking and patient will  benefit from further skilled therapy to return to prior level of function.    Personal Factors and Comorbidities  Age    Stability/Clinical Decision Making  Evolving/Moderate complexity    Rehab Potential  Fair    Clinical Impairments Affecting Rehab Potential  (+) family support, decreased coormorbities (-) age    PT Frequency  1x / week  PT Duration  6 weeks    PT Treatment/Interventions  Balance training;Neuromuscular re-education;Gait training;Patient/family education;Therapeutic exercise;Therapeutic activities;Functional mobility training;Stair training;Moist Heat;Aquatic Therapy;Electrical Stimulation;Cryotherapy;Manual techniques    PT Next Visit Plan  Attempt more dynamic balance tasks, ambulation over obstacles, ambulation with head turns, stepping response tasks    PT Home Exercise Plan  See education    Consulted and Agree with Plan of Care  Patient    Family Member Consulted  daughter       Patient will benefit from skilled therapeutic intervention in order to improve the following deficits and impairments:  Abnormal gait, Decreased coordination, Decreased endurance, Decreased activity tolerance, Decreased strength, Decreased range of motion, Decreased balance, Difficulty walking  Visit Diagnosis: Difficulty in walking, not elsewhere classified  Muscle weakness (generalized)     Problem List Patient Active Problem List   Diagnosis Date Noted  . Atrial fibrillation ([place]) 04/26/2017  . Anemia 04/26/2017  . Ischemic bowel disease ([place]) 03/18/2017  . Ischemic necrosis of small bowel ([place])   . Macular degeneration of left eye 01/06/2015  . Essential hypertension 12/25/2013  . BACK PAIN 08/02/2010    Blythe Stanford, PT DPT 12/10/2019, 1:53 PM  [place] [facility] [address], [place], [address] Phone: [phone]   Fax:  [phone]  Name: Paula Luna MRN: [id number] Date of Birth:  [birth year]

## 2019-12-10 NOTE — Addendum Note (Signed)
Addended by: Bethanie Dicker on: 12/10/2019 01:17 PM   Modules accepted: Orders

## 2019-12-11 ENCOUNTER — Encounter: Payer: Self-pay | Admitting: Internal Medicine

## 2019-12-11 ENCOUNTER — Ambulatory Visit (INDEPENDENT_AMBULATORY_CARE_PROVIDER_SITE_OTHER): Payer: PPO | Admitting: Internal Medicine

## 2019-12-11 VITALS — BP 162/70 | HR 65 | Ht 60.0 in | Wt 122.0 lb

## 2019-12-11 DIAGNOSIS — I10 Essential (primary) hypertension: Secondary | ICD-10-CM | POA: Diagnosis not present

## 2019-12-11 DIAGNOSIS — I4821 Permanent atrial fibrillation: Secondary | ICD-10-CM

## 2019-12-11 NOTE — Progress Notes (Signed)
Follow-up Outpatient Visit Date: 12/11/2019  Primary Care Provider: Owens Loffler, MD [place] [address]  Chief Complaint: Follow-up hypertension and atrial fibrillation  HPI:  Paula Luna is a 86 y.o. 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  She was last seen in our office in December, at which time Paula Luna was struggling with elevated blood pressures despite escalation of lisinopril.  Paula Luna was feeling well at that time.  Blood pressure in the office was elevated at 170/90.  2.5 mg daily was added, which was subsequently increased to 5 mg daily due to continued elevated home blood pressure readings.  Today, [name] reports feeling well.  Her home blood pressure readings have been somewhat labile to overall much better (readins 476'L-465'K systolic.  She notes mild ankle edema today but otherwise has not had much swelling.  She also denies chest pain, shortness of breath, and palpitations.  She has brief orthostatic lightheadedness if she stands up too quickly.  She is tolerating her current medications well.  She is scheduled to receive her second COVID-19 vaccine next week and is concerned about possible side effects.  --------------------------------------------------------------------------------------------------  Past Medical History:  Diagnosis Date  . Anemia   . Back pain   . Hypertension 12/25/2013  . Ischemic bowel disease ([place])    a. 03/2017 abd pain-->internal herina and ischemic jejunum s/p ex lap and small bowel resection.  . Macular degeneration of left eye 01/06/2015  . Osteoporosis   . Permanent atrial fibrillation ([place])    a. First noted 03/2017;  b. 07/2017 Echo: EF 60-65%, no rwma, mild MR, mildly to mod dil LA; c. 07/2017 48hr Holter: persistent Afib, avg HR of 66 (range 39-140  bpm);  d. CHA2DS2VASc = 4-->Xarelto initiated 07/2017.   Past Surgical History:  Procedure Laterality Date  . CLOSED REDUCTION PROXIMAL HUMERUS FRACTURE Left 2017  . LAPAROSCOPIC SMALL BOWEL RESECTION    . LAPAROTOMY N/A 03/18/2017   Procedure: EXPLORATORY LAPAROTOMY, SMALL BOWEL RESECTION;  Surgeon: Jules Husbands, MD;  Location: [facility] ORS;  Service: General;  Laterality: N/A;  . TOTAL ABDOMINAL HYSTERECTOMY      Current Meds  Medication Sig  . amLODipine (NORVASC) 5 MG tablet Take 1 tablet (5 mg total) by mouth daily.  . Cholecalciferol (VITAMIN D) 50 MCG (2000 UT) CAPS Take 1 capsule by mouth daily.  Marland Kitchen docusate sodium (COLACE) 100 MG capsule Take 200 mg by mouth daily.   . fluticasone (FLONASE) 50 MCG/ACT nasal spray Place 2 sprays into both nostrils daily.  . metoprolol succinate (TOPROL-XL) 25 MG 24 hr tablet Take 1 tablet by mouth once daily  . Multiple Vitamins-Minerals (ICAPS) CAPS Take 1 capsule by mouth 2 (two) times daily.   . NON FORMULARY Vitamin D 50 mcg, take one daily by mouth  . Rivaroxaban (XARELTO) 15 MG TABS tablet Take 1 tablet (15 mg total) by mouth daily with supper.    Allergies: Latex, Tetanus toxoid, and Alendronate sodium  Social History   Tobacco Use  . Smoking status: Former Research scientist (life sciences)  . Smokeless tobacco: Never Used  Substance Use Topics  . Alcohol use: No  . Drug use: No    Family History  Problem Relation Age of Onset  . Diabetes Mother   . Cancer Father        jaw    Review of Systems: A 12-system  review of systems was performed and was negative except as noted in the HPI.  --------------------------------------------------------------------------------------------------  Physical Exam: BP (!) 162/70 (BP Location: Left Arm, Patient Position: Sitting, Cuff Size: Normal)   Pulse 65   Ht 5' (1.524 m)   Wt 122 lb (55.3 kg)   BMI 23.83 kg/m   General:  NAD. Neck: No JVD or HJR. Lungs: CTA bilaterally. Heart: Irregularly irregular without  murmurs. Abd: Soft, NT/ND. Ext: Trace pretibial edema.  EKG:  Atrial fibrillation with slow ventricular response (ventricular rate 58 bpm) and poor R wave progression.  Ventricular rate has decreased since 08/30/2019.  PVC's versus aberrancy are no longer present.  Otherwise, there has been no significant interval change.  Lab Results  Component Value Date   WBC 5.7 02/18/2019   HGB 12.9 02/18/2019   HCT 39.5 02/18/2019   MCV 78.7 02/18/2019   PLT 180.0 02/18/2019    Lab Results  Component Value Date   NA 140 08/27/2019   K 3.7 08/27/2019   CL 104 08/27/2019   CO2 27 08/27/2019   BUN 15 08/27/2019   CREATININE 0.75 08/27/2019   GLUCOSE 92 08/27/2019   ALT 11 08/27/2019    Lab Results  Component Value Date   CHOL 169 02/18/2019   HDL 61.30 02/18/2019   LDLCALC 91 02/18/2019   LDLDIRECT 101.2 05/20/2009   TRIG 85.0 02/18/2019   CHOLHDL 3 02/18/2019    --------------------------------------------------------------------------------------------------  ASSESSMENT AND PLAN: Permanent atrial fibrillation: No symptoms with reasonable rate control (borderline low at rest today).  No medication changes to be made at this time.  We will continue indefinite anticoagulation with rivaroxaban 15 mg daily (adjusted for creatinine clearance <50 (driven primarily by age).  Hypertension: Blood pressure labile at home but overall better than at prior visits.  We discussed increasing amlodipine further but have agreed to defer this to prevent hypotension, as some systolic readings have been as low as the 120's.  Ms. Ketner should contact us if her home blood pressures are consistently above 140/90.  Follow-up: Return to clinic in 6 months.  Yvonne Kendall, MD 12/12/2019 8:27 PM

## 2019-12-11 NOTE — Patient Instructions (Addendum)

## 2019-12-12 ENCOUNTER — Encounter: Payer: Self-pay | Admitting: Internal Medicine

## 2019-12-23 ENCOUNTER — Telehealth: Payer: Self-pay | Admitting: *Deleted

## 2019-12-23 NOTE — Telephone Encounter (Signed)
Patient's daughter [name] called stating that her mom had her second covid pfizer vaccine Saturday am. [name] stated that her mom's arm became red Saturday around the injection site. [name] stated that her mom's arm has been red since Saturday. [name] stated that the redness is all of her shoulder and down to her elbow. [name] stated that the area has been warm to the touch and they have been using ice on her arm. Patient's daughter stated that her mom is not complaining of any symptoms and her arm is not hurting. [name] wants to know if they should do anything other than the ice?

## 2019-12-23 NOTE — Telephone Encounter (Signed)
[name] notified as instructed by telephone. 

## 2019-12-23 NOTE — Telephone Encounter (Signed)
Very common after injection.  She can add tylenol for pain, but time is the best healer for this. Ice helps.

## 2019-12-24 ENCOUNTER — Ambulatory Visit: Payer: PPO

## 2020-01-07 ENCOUNTER — Ambulatory Visit: Payer: PPO | Attending: Family Medicine

## 2020-01-07 ENCOUNTER — Other Ambulatory Visit: Payer: Self-pay

## 2020-01-07 DIAGNOSIS — M6281 Muscle weakness (generalized): Secondary | ICD-10-CM | POA: Insufficient documentation

## 2020-01-07 DIAGNOSIS — R262 Difficulty in walking, not elsewhere classified: Secondary | ICD-10-CM | POA: Insufficient documentation

## 2020-01-07 NOTE — Therapy (Signed)
[facility] [address], [place], [address] Phone: [phone]   Fax:  [phone]  Physical Therapy Treatment  Patient Details  Name: Paula Luna MRN: [id number] Date of Birth: 08-12-1925 No data recorded  Encounter Date: 01/07/2020  PT End of Session - 01/07/20 1309    Visit Number  75    Number of Visits  80    Date for PT Re-Evaluation  01/28/20    Authorization Type  FOTO    Authorization Time Period  2/10    PT Start Time  1300    PT Stop Time  1345    PT Time Calculation (min)  45 min    Equipment Utilized During Treatment  Gait belt    Activity Tolerance  Patient tolerated treatment well;Patient limited by fatigue    Behavior During Therapy  WFL for tasks assessed/performed       Past Medical History:  Diagnosis Date  . Anemia   . Back pain   . Hypertension 12/25/2013  . Ischemic bowel disease ([facility])    a. 03/2017 abd pain-->internal herina and ischemic jejunum s/p ex lap and small bowel resection.  . Macular degeneration of left eye 01/06/2015  . Osteoporosis   . Permanent atrial fibrillation ([facility])    a. First noted 03/2017;  b. 07/2017 Echo: EF 60-65%, no rwma, mild MR, mildly to mod dil LA; c. 07/2017 48hr Holter: persistent Afib, avg HR of 66 (range 39-140 bpm);  d. CHA2DS2VASc = 4-->Xarelto initiated 07/2017.    Past Surgical History:  Procedure Laterality Date  . CLOSED REDUCTION PROXIMAL HUMERUS FRACTURE Left 2017  . LAPAROSCOPIC SMALL BOWEL RESECTION    . LAPAROTOMY N/A 03/18/2017   Procedure: EXPLORATORY LAPAROTOMY, SMALL BOWEL RESECTION;  Surgeon: Leafy Ro, MD;  Location: [facility] ORS;  Service: General;  Laterality: N/A;  . TOTAL ABDOMINAL HYSTERECTOMY      There were no vitals filed for this visit.  Subjective Assessment - 01/07/20 1306    Subjective  Patient states she has been performing exercises in standing she reports she has been walking around outside.    Pertinent  History  Small bowel surgery     Limitations  Standing;Walking    Patient Stated Goals  To walk without feeling of falling     Currently in Pain?  No/denies         Therapeutic Exercise Nustep with patient in sitting seat level 6 level 5 with focus on improving speed and increasing power output (Watts) -- x Side stepping with therapist support - 10x 59ft airex beam Kicks in standing - x 15 with UE support  Ascending/desending the stairs - x 5 with B UE (4 stairs)  Performed exercises to improve LE strength and improve balance       PT Education - 01/07/20 1309    Education provided  Yes    Education Details  form/technique with exercise    Person(s) Educated  Patient    Methods  Explanation;Demonstration    Comprehension  Verbalized understanding;Returned demonstration          PT Long Term Goals - 11/26/19 1317      PT LONG TERM GOAL #1   Baseline  Dependent with exercise performance; 01/01/2018: moderate cueing for performance. 03/28/18 min-mod cueing required; 04/30/18: min cueing required; 09/24/2018: min cueing required; 11/12/2018: Patient performs exercise with little to no cueing.    Time  6    Period  Weeks  Status  Achieved      PT LONG TERM GOAL #2   Title  Patient will improve TUG to under 12 sec to indicate singificant decrease in fall risk.     Baseline  20sec; 01/01/2018: 14sec; 02/12/2018; 11.59 sec;    Time  8    Period  Weeks    Status  Achieved      PT LONG TERM GOAL #3   Title  Patient will improve her ability to perform 94mWT to 75m/s to indicate increased safety with walking at community ambulation    Baseline  .56m/s; 01/01/2018: .49m/s; 02/12/2018: .35m/s; 03/28/18 .94 m/s; 8/19/9: testing of goals deferred to next session; 06/28/2018: .66m/s; 09/24/2018: deferred; 10/08/2018: .85 m/s; 11/12/2018:  .89 m/s; 02/05/2019: .65m/s; 03/25/2019: .70m/s 05/13/2019: 1.26m/s    Time  8    Period  Weeks    Status  Achieved      PT LONG TERM GOAL #4   Title  Patient  will improve 5XSTS to under 16 sec to indicate significant improvement LE functional strength and decrease fall risk.    Baseline  30sec; 01/01/2018: 12 sec; 09/24/19 17 sec; 11/26/2019: 11 sec with use of hands on knees    Time  8    Period  Weeks    Status  On-going      PT LONG TERM GOAL #5   Title  Pt will ambulate 58ft in 3 minutes to demonstrate improved endurance with walking into department stores from the parking lot.     Baseline  03/28/18: 339ft; 04/30/18: testing of goals deferred to next session; 06/28/2018: 270ft; 09/24/2018: deferred; 10/08/2018: 247ft; 11/12/2018: 531ft with rollator; 02/04/2019: 553ft    Time  6    Period  Weeks    Status  Achieved      PT LONG TERM GOAL #6   Title  Patient will be able to perform >1029ft in a 16minWT with a rollator to decrease fall risk with ambulating at community distances.    Baseline  815ft with rollator; 05/13/2019: 1035ft    Time  6    Period  Weeks    Status  Achieved      PT LONG TERM GOAL #7   Title  Patient will improve DGI by 5 points to indicate singificant improvement in dynamic balance and decrease fall risk.    Baseline  05/13/2019:15/2410/20/2020: Deferred taking measurements secondary to time constraints; 07/16/2019 15/24; 09/24/19 deferred due to time constraints as pt required seated rest break after each exercise in today's session; 11/26/2019: 18/24    Time  8    Period  Weeks    Status  On-going            Plan - 01/07/20 1311    Clinical Impression Statement  Performed exercise to address LE weakness today. Patient requires prolonged sitting rest breaks throughout the session and refuses to perform exercises at end of the session secondary to being afraid of "passing gas". Patient will benefit from further skilled therapy further skilled therapy to return to prior level of function.    Personal Factors and Comorbidities  Age    Stability/Clinical Decision Making  Evolving/Moderate complexity    Rehab Potential  Fair     Clinical Impairments Affecting Rehab Potential  (+) family support, decreased coormorbities (-) age    PT Frequency  1x / week    PT Duration  6 weeks    PT Treatment/Interventions  Balance training;Neuromuscular re-education;Gait training;Patient/family education;Therapeutic exercise;Therapeutic activities;Functional mobility training;Stair training;Moist  Heat;Aquatic Therapy;Electrical Stimulation;Cryotherapy;Manual techniques    PT Next Visit Plan  Attempt more dynamic balance tasks, ambulation over obstacles, ambulation with head turns, stepping response tasks    PT Home Exercise Plan  See education    Consulted and Agree with Plan of Care  Patient    Family Member Consulted  daughter       Patient will benefit from skilled therapeutic intervention in order to improve the following deficits and impairments:  Abnormal gait, Decreased coordination, Decreased endurance, Decreased activity tolerance, Decreased strength, Decreased range of motion, Decreased balance, Difficulty walking  Visit Diagnosis: Difficulty in walking, not elsewhere classified  Muscle weakness (generalized)     Problem List Patient Active Problem List   Diagnosis Date Noted  . Atrial fibrillation ([facility]) 04/26/2017  . Anemia 04/26/2017  . 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 Macular degeneration of left eye 01/06/2015  . Essential hypertension 12/25/2013  . BACK PAIN 08/02/2010    Myrene Galas, PT DPT 01/07/2020, 1:50 PM  [facility] [address], [place], [address] Phone: [phone]   Fax:  [phone]  Name: Paula Luna MRN: [id number] Date of Birth: 1925/09/03

## 2020-01-15 ENCOUNTER — Ambulatory Visit: Payer: PPO

## 2020-01-21 ENCOUNTER — Other Ambulatory Visit: Payer: Self-pay | Admitting: Family Medicine

## 2020-02-05 ENCOUNTER — Ambulatory Visit: Payer: PPO

## 2020-02-11 DIAGNOSIS — H353124 Nonexudative age-related macular degeneration, left eye, advanced atrophic with subfoveal involvement: Secondary | ICD-10-CM | POA: Diagnosis not present

## 2020-02-11 DIAGNOSIS — H353112 Nonexudative age-related macular degeneration, right eye, intermediate dry stage: Secondary | ICD-10-CM | POA: Diagnosis not present

## 2020-03-02 ENCOUNTER — Other Ambulatory Visit: Payer: Self-pay | Admitting: Internal Medicine

## 2020-03-03 NOTE — Telephone Encounter (Signed)
Medication sent.

## 2020-03-03 NOTE — Telephone Encounter (Signed)
Lmovm to verify medication not on medication list.

## 2020-03-03 NOTE — Telephone Encounter (Signed)
In looking at chart, OV on 08/29/20 it is noted patient is taking lisinopril 40 mg daily. I cannot find where it was every discontinued. Its currently on her med list but not on her last OV on 12/11/19. Patient has been taking it.  Routing to Dr End for advice.

## 2020-03-03 NOTE — Telephone Encounter (Signed)
Pt taking Lisinopril 40 mg tablet qd. Please advise if ok to refill not on last ov note med list.

## 2020-03-03 NOTE — Telephone Encounter (Signed)
Okay to refill lisinopril.  This medication is likely not listed in the note from March due to a glitch in [place].  If an expiration date is entered in the order (even if more medication was prescribed), it will not auto-populate in our notes.  Yvonne Kendall, MD [facility]

## 2020-03-04 ENCOUNTER — Ambulatory Visit: Payer: PPO | Attending: Family Medicine

## 2020-03-04 ENCOUNTER — Other Ambulatory Visit: Payer: Self-pay

## 2020-03-04 DIAGNOSIS — R262 Difficulty in walking, not elsewhere classified: Secondary | ICD-10-CM

## 2020-03-04 DIAGNOSIS — M6281 Muscle weakness (generalized): Secondary | ICD-10-CM | POA: Diagnosis not present

## 2020-03-04 NOTE — Therapy (Signed)
[facility] [address] [place], [address] Phone: [phone]   Fax:  [phone]  Physical Therapy Treatment/Progress Note  Patient Details  Name: Paula Luna MRN: [id number] Date of Birth: 1924-12-11 No data recorded  Reporting period: 11/26/2019 - 03/04/2020  Encounter Date: 03/04/2020   PT End of Session - 03/04/20 1625    Visit Number 76    Number of Visits 80    Date for PT Re-Evaluation 01/28/20    Authorization Type FOTO    Authorization Time Period 3/10    PT Start Time 1300    PT Stop Time 1345    PT Time Calculation (min) 45 min    Equipment Utilized During Treatment Gait belt    Activity Tolerance Patient tolerated treatment well;Patient limited by fatigue    Behavior During Therapy [facility] for tasks assessed/performed           Past Medical History:  Diagnosis Date  . Anemia   . Back pain   . Hypertension 12/25/2013  . Ischemic bowel disease ([facility])    a. 03/2017 abd pain-->internal herina and ischemic jejunum s/p ex lap and small bowel resection.  . Macular degeneration of left eye 01/06/2015  . Osteoporosis   . Permanent atrial fibrillation ([facility])    a. First noted 03/2017;  b. 07/2017 Echo: EF 60-65%, no rwma, mild MR, mildly to mod dil LA; c. 07/2017 48hr Holter: persistent Afib, avg HR of 66 (range 39-140 bpm);  d. CHA2DS2VASc = 4-->Xarelto initiated 07/2017.    Past Surgical History:  Procedure Laterality Date  . CLOSED REDUCTION PROXIMAL HUMERUS FRACTURE Left 2017  . LAPAROSCOPIC SMALL BOWEL RESECTION    . LAPAROTOMY N/A 03/18/2017   Procedure: EXPLORATORY LAPAROTOMY, SMALL BOWEL RESECTION;  Surgeon: Leafy Ro, MD;  Location: [facility] ORS;  Service: General;  Laterality: N/A;  . TOTAL ABDOMINAL HYSTERECTOMY      There were no vitals filed for this visit.   Subjective Assessment - 03/04/20 1323    Subjective Patient reports she has been walking for exercises inside the house.  Patient reports she has not been able to return to therapy secondary to bowel issues.    Pertinent History Small bowel surgery     Limitations Standing;Walking    Patient Stated Goals To walk without feeling of falling     Currently in Pain? No/denies           TREATMENT Therapeutic Exercise BP at beginning of session 178/1mmHg; after 5 min 156/32mmHg  Nustep with patient in sitting seat level 6 level 5 with focus on improving speed and increasing power output (Watts) -- x 8.62min Ascending/desending the stairs - x 1 with B UE (4 stairs) Ambulation with head turns up/down, left/right - x 41ft Ambulation fast/slow - x48ft Sit to stand with use of UE on Knees - x 5 Sit to stand without use of UE - x 5   Performed exercises to improve LE strength and improve balance     PT Education - 03/04/20 1619    Education provided Yes    Education Details form/technique with exercise    Person(s) Educated Patient    Methods Explanation;Demonstration    Comprehension Verbalized understanding;Returned demonstration               PT Long Term Goals - 03/04/20 1656      PT LONG TERM GOAL #1   Title Patient will be independent with HEP focused on decreasing  fall risk to continue benefits of therapy after discharge.     Baseline Dependent with exercise performance; 01/01/2018: moderate cueing for performance. 03/28/18 min-mod cueing required; 04/30/18: min cueing required; 09/24/2018: min cueing required; 11/12/2018: Patient performs exercise with little to no cueing.    Time 6    Period Weeks    Status Achieved      PT LONG TERM GOAL #2   Title Patient will improve TUG to under 12 sec to indicate singificant decrease in fall risk.     Baseline 20sec; 01/01/2018: 14sec; 02/12/2018; 11.59 sec;    Time 8    Period Weeks    Status Achieved      PT LONG TERM GOAL #3   Title Patient will improve her ability to perform 75mWT to 46m/s to indicate increased safety with walking at community  ambulation    Baseline .75m/s; 01/01/2018: .87m/s; 02/12/2018: .43m/s; 03/28/18 .94 m/s; 8/19/9: testing of goals deferred to next session; 06/28/2018: .68m/s; 09/24/2018: deferred; 10/08/2018: .85 m/s; 11/12/2018:  .89 m/s; 02/05/2019: .32m/s; 03/25/2019: .72m/s 05/13/2019: 1.30m/s    Time 8    Period Weeks    Status Achieved      PT LONG TERM GOAL #4   Title Patient will improve 5XSTS to under 16 sec to indicate significant improvement LE functional strength and decrease fall risk.    Baseline 30sec; 01/01/2018: 12 sec; 09/24/19 17 sec; 11/26/2019: 11 sec with use of hands on knees; 03/04/2020: 16 without use of UEs    Time 8    Period Weeks    Status On-going      PT LONG TERM GOAL #5   Title Pt will ambulate 526ft in 3 minutes to demonstrate improved endurance with walking into department stores from the parking lot.     Baseline 03/28/18: 387ft; 04/30/18: testing of goals deferred to next session; 06/28/2018: 290ft; 09/24/2018: deferred; 10/08/2018: 28ft; 11/12/2018: 563ft with rollator; 02/04/2019: 560ft    Time 6    Period Weeks    Status Achieved      Additional Long Term Goals   Additional Long Term Goals Yes      PT LONG TERM GOAL #6   Title Patient will be able to perform >105ft in a 61minWT with a rollator to decrease fall risk with ambulating at community distances.    Baseline 850ft with rollator; 05/13/2019: 1040ft    Time 6    Period Weeks    Status Achieved      PT LONG TERM GOAL #7   Title Patient will improve DGI by 5 points to indicate singificant improvement in dynamic balance and decrease fall risk.    Baseline 05/13/2019:15/2410/20/2020: Deferred taking measurements secondary to time constraints; 07/16/2019 15/24; 09/24/19 deferred due to time constraints as pt required seated rest break after each exercise in today's session; 11/26/2019: 18/24; 03/04/2020: 19/24    Time 8    Period Weeks    Status On-going      PT LONG TERM GOAL #8   Title Patient will improve her 67min walk test to  over 469ft to indicate singificnat improvement in ambulation endurance.    Baseline 343 ft with rollator    Time 6    Period Weeks    Status New                 Plan - 03/04/20 1634    Clinical Impression Statement Patient has made progress towards long term goals with improvement in 2 min walk and DGI compared  to previous sessions indicating functional carryover between sessions. Patient continues to have decreased speed and distance with performing ambulation, although this is improved compared to previous sessions. Patient will benefit from furhter skilled therapy to return to prior level of function.    Personal Factors and Comorbidities Age    Stability/Clinical Decision Making Evolving/Moderate complexity    Rehab Potential Fair    Clinical Impairments Affecting Rehab Potential (+) family support, decreased coormorbities (-) age    PT Frequency 1x / week    PT Duration 6 weeks    PT Treatment/Interventions Balance training;Neuromuscular re-education;Gait training;Patient/family education;Therapeutic exercise;Therapeutic activities;Functional mobility training;Stair training;Moist Heat;Aquatic Therapy;Electrical Stimulation;Cryotherapy;Manual techniques    PT Next Visit Plan Attempt more dynamic balance tasks, ambulation over obstacles, ambulation with head turns, stepping response tasks    PT Home Exercise Plan See education    Consulted and Agree with Plan of Care Patient    Family Member Consulted daughter           Patient will benefit from skilled therapeutic intervention in order to improve the following deficits and impairments:  Abnormal gait, Decreased coordination, Decreased endurance, Decreased activity tolerance, Decreased strength, Decreased range of motion, Decreased balance, Difficulty walking  Visit Diagnosis: Difficulty in walking, not elsewhere classified  Muscle weakness (generalized)     Problem List Patient Active Problem List   Diagnosis Date  Noted  . Atrial fibrillation ([facility]) 04/26/2017  . Anemia 04/26/2017  . 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 Macular degeneration of left eye 01/06/2015  . Essential hypertension 12/25/2013  . BACK PAIN 08/02/2010    Myrene Galas, PT DPT 03/04/2020, 6:03 PM  [facility] AND SPORTS MEDICINE [address], [place], [address] Phone: [phone]   Fax:  [phone]  Name: BRADLEE BRIDGERS MRN: [id number] Date of Birth: [birth year]

## 2020-03-08 ENCOUNTER — Other Ambulatory Visit: Payer: Self-pay | Admitting: Internal Medicine

## 2020-03-09 NOTE — Telephone Encounter (Signed)
Pt's age 86, wt 55.3 kg, SCr 0.75, CrCl  40.91, last ov w/ CE 12/11/19. Pt on lower dose of Xarelto due to age, wt & CrCl.

## 2020-03-09 NOTE — Telephone Encounter (Signed)
Refill Request.  

## 2020-03-10 ENCOUNTER — Ambulatory Visit: Payer: PPO

## 2020-03-17 ENCOUNTER — Other Ambulatory Visit: Payer: Self-pay | Admitting: *Deleted

## 2020-03-17 ENCOUNTER — Telehealth: Payer: Self-pay | Admitting: Internal Medicine

## 2020-03-17 ENCOUNTER — Other Ambulatory Visit: Payer: Self-pay

## 2020-03-17 ENCOUNTER — Ambulatory Visit: Payer: PPO | Attending: Family Medicine

## 2020-03-17 DIAGNOSIS — M6281 Muscle weakness (generalized): Secondary | ICD-10-CM | POA: Diagnosis not present

## 2020-03-17 DIAGNOSIS — R262 Difficulty in walking, not elsewhere classified: Secondary | ICD-10-CM | POA: Insufficient documentation

## 2020-03-17 NOTE — Telephone Encounter (Signed)
I spoke with pt's daughter [name] and verified if she needed [place] to send in any Rx for pt? She mentioned that the only medication that [facility] is assisting her with is Xarelto 15 mg tablet. Pt's daughter will be contacting pharmacy to verify if they need 30 day or 90 day Rx. Pt is aware that they are mail order. She will contact office for correct quantity needed.

## 2020-03-17 NOTE — Therapy (Signed)
[facility] PHYSICAL AND SPORTS MEDICINE [address], [place], [address] Phone: [phone]   Fax:  [phone]  Physical Therapy Treatment  Patient Details  Name: Paula Luna MRN: [id number] Date of Birth: 1925/07/04 No data recorded  Encounter Date: 03/17/2020   PT End of Session - 03/17/20 1528    Visit Number 77    Number of Visits 80    Date for PT Re-Evaluation 04/15/20    Authorization Type FOTO    Authorization Time Period 3/10    PT Start Time 1515    PT Stop Time 1600    PT Time Calculation (min) 45 min    Equipment Utilized During Treatment Gait belt    Activity Tolerance Patient tolerated treatment well;Patient limited by fatigue    Behavior During Therapy WFL for tasks assessed/performed           Past Medical History:  Diagnosis Date  . Anemia   . Back pain   . Hypertension 12/25/2013  . Ischemic bowel disease (HCC)    a. 03/2017 abd pain-->internal herina and ischemic jejunum s/p ex lap and small bowel resection.  . Macular degeneration of left eye 01/06/2015  . Osteoporosis   . Permanent atrial fibrillation (HCC)    a. First noted 03/2017;  b. 07/2017 Echo: EF 60-65%, no rwma, mild MR, mildly to mod dil LA; c. 07/2017 48hr Holter: persistent Afib, avg HR of 66 (range 39-140 bpm);  d. CHA2DS2VASc = 4-->Xarelto initiated 07/2017.    Past Surgical History:  Procedure Laterality Date  . CLOSED REDUCTION PROXIMAL HUMERUS FRACTURE Left 2017  . LAPAROSCOPIC SMALL BOWEL RESECTION    . LAPAROTOMY N/A 03/18/2017   Procedure: EXPLORATORY LAPAROTOMY, SMALL BOWEL RESECTION;  Surgeon: Leafy Ro, MD;  Location: [facility] ORS;  Service: General;  Laterality: N/A;  . TOTAL ABDOMINAL HYSTERECTOMY      There were no vitals filed for this visit.   Subjective Assessment - 03/17/20 1526    Subjective Patient states no major changes since the previous session. Patient states she has been having a difficulty with seeing.     Pertinent History Small bowel surgery     Limitations Standing;Walking    Patient Stated Goals To walk without feeling of falling     Currently in Pain? No/denies             TREATMENT Therapeutic Exercise   Nustep with patient in sitting seat level 6 level 5 with focus on improving speed and increasing power output (Watts) -- x Ascending/desending the stairs - x 7 with B UE (4 stairs) Ambulation with rollator - x1036ft  Sit to stand without use of UE -  2 x 10 ; 2 x 10    Performed exercises to improve LE strength and improve balance       PT Education - 03/17/20 1527    Education provided Yes    Education Details form/technique with exercise    Person(s) Educated Patient    Methods Explanation;Demonstration    Comprehension Verbalized understanding;Returned demonstration               PT Long Term Goals - 03/04/20 1656      PT LONG TERM GOAL #1   Title Patient will be independent with HEP focused on decreasing fall risk to continue benefits of therapy after discharge.     Baseline Dependent with exercise performance; 01/01/2018: moderate cueing for performance. 03/28/18 min-mod cueing required; 04/30/18: min cueing required;  09/24/2018: min cueing required; 11/12/2018: Patient performs exercise with little to no cueing.    Time 6    Period Weeks    Status Achieved      PT LONG TERM GOAL #2   Title Patient will improve TUG to under 12 sec to indicate singificant decrease in fall risk.     Baseline 20sec; 01/01/2018: 14sec; 02/12/2018; 11.59 sec;    Time 8    Period Weeks    Status Achieved      PT LONG TERM GOAL #3   Title Patient will improve her ability to perform to 70m/s to indicate increased safety with walking at community ambulation    Baseline .33m/s; 01/01/2018: .54m/s; 02/12/2018: .26m/s; 03/28/18 .94 m/s; 8/19/9: testing of goals deferred to next session; 06/28/2018: .16m/s; 09/24/2018: deferred; 10/08/2018: .85 m/s; 11/12/2018:  .89 m/s; 02/05/2019: .59m/s;  03/25/2019: .1m/s 05/13/2019: 1.17m/s    Time 8    Period Weeks    Status Achieved      PT LONG TERM GOAL #4   Title Patient will improve 5XSTS to under 16 sec to indicate significant improvement LE functional strength and decrease fall risk.    Baseline 30sec; 01/01/2018: 12 sec; 09/24/19 17 sec; 11/26/2019: 11 sec with use of hands on knees; 03/04/2020: 16 without use of UEs    Time 8    Period Weeks    Status On-going      PT LONG TERM GOAL #5   Title Pt will ambulate 519ft in 3 minutes to demonstrate improved endurance with walking into department stores from the parking lot.     Baseline 03/28/18: 329ft; 04/30/18: testing of goals deferred to next session; 06/28/2018: 210ft; 09/24/2018: deferred; 10/08/2018: 274ft; 11/12/2018: 515ft with rollator; 02/04/2019: 583ft    Time 6    Period Weeks    Status Achieved      Additional Long Term Goals   Additional Long Term Goals Yes      PT LONG TERM GOAL #6   Title Patient will be able to perform >1038ft in a with a rollator to decrease fall risk with ambulating at community distances.    Baseline 839ft with rollator; 05/13/2019: 1023ft    Time 6    Period Weeks    Status Achieved      PT LONG TERM GOAL #7   Title Patient will improve DGI by 5 points to indicate singificant improvement in dynamic balance and decrease fall risk.    Baseline 05/13/2019:15/2410/20/2020: Deferred taking measurements secondary to time constraints; 07/16/2019 15/24; 09/24/19 deferred due to time constraints as pt required seated rest break after each exercise in today's session; 11/26/2019: 18/24; 03/04/2020: 19/24    Time 8    Period Weeks    Status On-going      PT LONG TERM GOAL #8   Title Patient will improve her walk test to over 426ft to indicate singificnat improvement in ambulation endurance.    Baseline 343 ft with rollator    Time 6    Period Weeks    Status New                 Plan - 03/17/20 1549    Clinical Impression Statement  Continued to focus on improving LE strength and endurance with ambulation. Patient does well with this however becomes fatigued quickly and required long rest breaks and frequent rest breaks throughout the session. Patient will benefit from further skilled therapy to return to prior level of function.  Personal Factors and Comorbidities Age    Stability/Clinical Decision Making Evolving/Moderate complexity    Rehab Potential Fair    Clinical Impairments Affecting Rehab Potential (+) family support, decreased coormorbities (-) age    PT Frequency 1x / week    PT Duration 6 weeks    PT Treatment/Interventions Balance training;Neuromuscular re-education;Gait training;Patient/family education;Therapeutic exercise;Therapeutic activities;Functional mobility training;Stair training;Moist Heat;Aquatic Therapy;Electrical Stimulation;Cryotherapy;Manual techniques    PT Next Visit Plan Attempt more dynamic balance tasks, ambulation over obstacles, ambulation with head turns, stepping response tasks    PT Home Exercise Plan See education    Consulted and Agree with Plan of Care Patient    Family Member Consulted daughter           Patient will benefit from skilled therapeutic intervention in order to improve the following deficits and impairments:  Abnormal gait, Decreased coordination, Decreased endurance, Decreased activity tolerance, Decreased strength, Decreased range of motion, Decreased balance, Difficulty walking  Visit Diagnosis: Difficulty in walking, not elsewhere classified  Muscle weakness (generalized)     Problem List Patient Active Problem List   Diagnosis Date Noted  . Atrial fibrillation (HCC) 04/26/2017  . Anemia 04/26/2017  . 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 Macular degeneration of left eye 01/06/2015  . Essential hypertension 12/25/2013  . BACK PAIN 08/02/2010    Myrene Galas, PT DPT  03/17/2020, 3:57 PM  [facility] PHYSICAL AND SPORTS MEDICINE [address] [place], [address] Phone: [phone]   Fax:  [phone]  Name: Paula Luna MRN: [id number] Date of Birth: Dec 14, 1924

## 2020-03-17 NOTE — Telephone Encounter (Signed)
Patient daughter [name] calling  States that patient is now in the donut hole She has arranged for patient to get medications through [facility] pharmacy from August - December All prescriptions will be for 30 day supplies If any questions or concerns please call [name] to discuss

## 2020-03-18 ENCOUNTER — Other Ambulatory Visit (INDEPENDENT_AMBULATORY_CARE_PROVIDER_SITE_OTHER): Payer: PPO

## 2020-03-18 DIAGNOSIS — E785 Hyperlipidemia, unspecified: Secondary | ICD-10-CM | POA: Diagnosis not present

## 2020-03-18 DIAGNOSIS — Z79899 Other long term (current) drug therapy: Secondary | ICD-10-CM

## 2020-03-18 DIAGNOSIS — E559 Vitamin D deficiency, unspecified: Secondary | ICD-10-CM

## 2020-03-18 LAB — CBC WITH DIFFERENTIAL/PLATELET
Basophils Absolute: 0 10*3/uL (ref 0.0–0.1)
Basophils Relative: 0.5 % (ref 0.0–3.0)
Eosinophils Absolute: 0.1 10*3/uL (ref 0.0–0.7)
Eosinophils Relative: 1.8 % (ref 0.0–5.0)
HCT: 40 % (ref 36.0–46.0)
Hemoglobin: 12.9 g/dL (ref 12.0–15.0)
Lymphocytes Relative: 33.6 % (ref 12.0–46.0)
Lymphs Abs: 2.1 10*3/uL (ref 0.7–4.0)
MCHC: 32.3 g/dL (ref 30.0–36.0)
MCV: 79.6 fl (ref 78.0–100.0)
Monocytes Absolute: 0.6 10*3/uL (ref 0.1–1.0)
Monocytes Relative: 10.2 % (ref 3.0–12.0)
Neutro Abs: 3.4 10*3/uL (ref 1.4–7.7)
Neutrophils Relative %: 53.9 % (ref 43.0–77.0)
Platelets: 183 10*3/uL (ref 150.0–400.0)
RBC: 5.02 Mil/uL (ref 3.87–5.11)
RDW: 16.3 % — ABNORMAL HIGH (ref 11.5–15.5)
WBC: 6.2 10*3/uL (ref 4.0–10.5)

## 2020-03-18 LAB — BASIC METABOLIC PANEL
BUN: 15 mg/dL (ref 6–23)
CO2: 29 mEq/L (ref 19–32)
Calcium: 9.4 mg/dL (ref 8.4–10.5)
Chloride: 104 mEq/L (ref 96–112)
Creatinine, Ser: 0.88 mg/dL (ref 0.40–1.20)
GFR: 59.91 mL/min — ABNORMAL LOW (ref >60.00)
Glucose, Bld: 97 mg/dL (ref 70–99)
Potassium: 4 mEq/L (ref 3.5–5.1)
Sodium: 141 mEq/L (ref 135–145)

## 2020-03-18 LAB — HEPATIC FUNCTION PANEL
ALT: 9 U/L (ref 0–35)
AST: 16 U/L (ref 0–37)
Albumin: 4.1 g/dL (ref 3.5–5.2)
Alkaline Phosphatase: 87 U/L (ref 39–117)
Bilirubin, Direct: 0.1 mg/dL (ref 0.0–0.3)
Total Bilirubin: 0.5 mg/dL (ref 0.2–1.2)
Total Protein: 6.4 g/dL (ref 6.0–8.3)

## 2020-03-18 LAB — TSH: TSH: 2.12 u[IU]/mL (ref 0.35–4.50)

## 2020-03-18 LAB — LIPID PANEL
Cholesterol: 166 mg/dL (ref 0–200)
HDL: 63.1 mg/dL (ref >39.00)
LDL Cholesterol: 86 mg/dL (ref 0–99)
NonHDL: 102.63
Total CHOL/HDL Ratio: 3
Triglycerides: 84 mg/dL (ref 0.0–149.0)
VLDL: 16.8 mg/dL (ref 0.0–40.0)

## 2020-03-18 LAB — VITAMIN D 25 HYDROXY (VIT D DEFICIENCY, FRACTURES): VITD: 52.4 ng/mL (ref 30.00–100.00)

## 2020-03-18 NOTE — Telephone Encounter (Signed)
Spoke with Paula Luna, ok per DPR. They're going to have the Xarelto prescription sent to [facility] mail order through the end of the year so that they're able to get it for a lower price. They did the same thing last year and it worked out well.  She is not sure if it needs to be a 30 or 90-day supply; therefore, she will call us back to let us know how we need to send it in.

## 2020-03-18 NOTE — Telephone Encounter (Signed)
Patients daughter calling back after talking to [facility] pharmacy for Xarelto. Since patient used them last year patient still has a script for August and September. Arlys John will request for October-December and when that happens patient needs 30 days only

## 2020-03-24 NOTE — Progress Notes (Signed)
[name], MD, [place] at [facility] [place], [address]  Phone: [phone]   FAX: [phone]  Paula Luna - 86 y.o. female   MRN [id number]   Date of Birth: 1925-01-06  Date: 03/25/2020   PCP: Owens Loffler, MD   Referral: Owens Loffler, MD  Chief Complaint  Patient presents with   Medicare Wellness    This visit occurred during the SARS-CoV-2 public health emergency.  Safety protocols were in place, including screening questions prior to the visit, additional usage of staff PPE, and extensive cleaning of exam room while observing appropriate contact time as indicated for disinfecting solutions.   Patient Care Team: Owens Loffler, MD as PCP - General End, Harrell Gave, MD as PCP - Cardiology (Cardiology) Subjective:   [name] is a 86 y.o. pleasant patient who presents for a medicare wellness examination:  She is a highly functional 86 year old patient, and she is young for age.  She does have A. fib on Xarelto and rate controlled, managed by cardiology.  Macular degeneration.  Wt Readings from Last 3 Encounters:  03/25/20 121 lb 8 oz (55.1 kg)  12/11/19 122 lb (55.3 kg)  08/30/19 122 lb 12 oz (55.7 kg)    Prune juice has helped a lot.  This is with her bowel movements.  Fall with getting out of the bathtub, but thankfully she did not sustain any form of injury.  Health Maintenance Summary Reviewed and updated, unless pt declines services.  Tobacco History Reviewed. Non-smoker Alcohol: No concerns, no excessive use Exercise Habits: Fairly limited secondary to age and mobility.  Up-to-date on all vaccines  Health Maintenance  Topic Date Due   INFLUENZA VACCINE  04/12/2020   DEXA SCAN  Completed   COVID-19 Vaccine  Completed   PNA vac Low Risk Adult  Completed   Immunization History  Administered Date(s) Administered   Influenza  Split 07/12/2012, 06/28/2013   Influenza Whole 08/20/2007, 05/10/2010   Influenza, High Dose Seasonal PF 08/14/2014, 07/29/2015, 05/22/2016, 06/10/2017, 06/20/2018   Influenza,inj,Quad PF,6+ Mos 05/07/2019   PFIZER SARS-COV-2 Vaccination 11/29/2019, 12/20/2019   Pneumococcal Conjugate-13 12/25/2013   Pneumococcal Polysaccharide-23 05/10/2010   Zoster 10/15/2012   Zoster Recombinat (Shingrix) 03/06/2018    Patient Active Problem List   Diagnosis Date Noted   Atrial fibrillation ([place]) 04/26/2017   Anemia 04/26/2017   Ischemic bowel disease ([place]) 03/18/2017   Ischemic necrosis of small bowel ([place])    Macular degeneration of left eye 01/06/2015   Essential hypertension 12/25/2013   BACK PAIN 08/02/2010    Past Medical History:  Diagnosis Date   Anemia    Back pain    Hypertension 12/25/2013   Ischemic bowel disease ([place])    a. 03/2017 abd pain-->internal herina and ischemic jejunum s/p ex lap and small bowel resection.   Macular degeneration of left eye 01/06/2015   Osteoporosis    Permanent atrial fibrillation ([place])    a. First noted 03/2017;  b. 07/2017 Echo: EF 60-65%, no rwma, mild MR, mildly to mod dil LA; c. 07/2017 48hr Holter: persistent Afib, avg HR of 66 (range 39-140 bpm);  d. CHA2DS2VASc = 4-->Xarelto initiated 07/2017.    Past Surgical History:  Procedure Laterality Date   CLOSED REDUCTION PROXIMAL HUMERUS FRACTURE Left 2017   LAPAROSCOPIC SMALL BOWEL RESECTION     LAPAROTOMY N/A 03/18/2017   Procedure: EXPLORATORY LAPAROTOMY, SMALL BOWEL RESECTION;  Surgeon: Caroleen Hamman  F, MD;  Location: [facility] ORS;  Service: General;  Laterality: N/A;   TOTAL ABDOMINAL HYSTERECTOMY      Family History  Problem Relation Age of Onset   Diabetes Mother    Cancer Father        jaw    Past Medical History, Surgical History, Social History, Family History, Problem List, Medications, and Allergies have been reviewed and updated if relevant.  Review of  Systems: Pertinent positives are listed above.  Otherwise, a full 14 point review of systems has been done in full and it is negative except where it is noted positive.  Objective:   BP 138/84    Pulse 67    Temp 98.2 F (36.8 C) (Temporal)    Ht _0  (1.473 m)    Wt 121 lb 8 oz (55.1 kg)    SpO2 98%    BMI 25.39 kg/m  Fall Risk  03/25/2020 02/18/2019 02/13/2018 01/23/2017 01/20/2016  Falls in the past year? 0 0 No Yes Yes  Comment - - - pt had accidental fall in home; soreness to right hip; no medical treatment -  Number falls in past yr: - - - 1 1  Injury with Fall? - - - Yes Yes  Risk for fall due to : - - - - -  Risk for fall due to: Comment - - - - -   Ideal Body Weight: Weight in (lb) to have BMI = 25: 119.4  Hearing Screening   Method: Audiometry   _1  _2  _3  _4  _5  _6  _7  _8  _9   Right ear:   _10 Left ear:   _11 Vision Screening Comments: Wears Glasses-Eye Exam with Dr. Deatra Ina 01/2020 Depression screen [facility] 2/9 03/25/2020 02/18/2019 02/13/2018 01/23/2017 01/20/2016  Decreased Interest 0 0 0 0 0  Down, Depressed, Hopeless 0 0 0 0 0  PHQ - 2 Score 0 0 0 0 0  Altered sleeping - 0 0 - -  Tired, decreased energy - 0 0 - -  Change in appetite - 0 0 - -  Feeling bad or failure about yourself  - 0 0 - -  Trouble concentrating - 0 0 - -  Moving slowly or fidgety/restless - 0 0 - -  Suicidal thoughts - 0 0 - -  PHQ-9 Score - 0 0 - -  Difficult doing work/chores - Not difficult at all Not difficult at all - -  Some recent data might be hidden     GEN: well developed, well nourished, no acute distress Eyes: conjunctiva and lids normal, PERRLA, EOMI ENT: TM clear, nares clear, oral exam WNL Neck: supple, no lymphadenopathy, no thyromegaly, no JVD Pulm: clear to auscultation and percussion, respiratory effort normal CV: regular rate and rhythm, S1-S2, no murmur, rub or gallop, no bruits Chest: no scars, masses, no lumps BREAST: breast exam  declined GI: soft, non-tender; no hepatosplenomegaly, masses; active bowel sounds all quadrants GU: GU exam declined Lymph: no cervical, axillary or inguinal adenopathy MSK: gait normal, muscle tone and strength WNL, no joint swelling, effusions, discoloration, crepitus  SKIN: clear, good turgor, color WNL, no rashes, lesions, or ulcerations Neuro: normal mental status, normal strength, sensation, and motion Psych: alert; oriented to person, place and time, normally interactive and not anxious or depressed in appearance.   All labs reviewed with patient.  Lipids: Lab Results  Component Value Date   CHOL 166 03/18/2020   Lab  Results  Component Value Date   HDL 63.10 03/18/2020   Lab Results  Component Value Date   LDLCALC 86 03/18/2020   Lab Results  Component Value Date   TRIG 84.0 03/18/2020   Lab Results  Component Value Date   CHOLHDL 3 03/18/2020   CBC: CBC Latest Ref Rng & Units 03/18/2020 02/18/2019 08/22/2018  WBC 4.0 - 10.5 K/uL 6.2 5.7 7.8  Hemoglobin 12.0 - 15.0 g/dL 12.9 12.9 13.6  Hematocrit 36 - 46 % 40.0 39.5 41.9  Platelets 150 - 400 K/uL 183.0 180.0 448.1    Basic Metabolic Panel:    Component Value Date/Time   NA 141 03/18/2020 1007   NA 144 06/27/2017 1444   K 4.0 03/18/2020 1007   CL 104 03/18/2020 1007   CO2 29 03/18/2020 1007   BUN 15 03/18/2020 1007   BUN 13 06/27/2017 1444   CREATININE 0.88 03/18/2020 1007   GLUCOSE 97 03/18/2020 1007   CALCIUM 9.4 03/18/2020 1007   Hepatic Function Latest Ref Rng & Units 03/18/2020 08/27/2019 08/22/2018  Total Protein 6.0 - 8.3 g/dL 6.4 7.2 7.0  Albumin 3.5 - 5.2 g/dL 4.1 4.0 4.1  AST 0 - 37 U/L _0 ALT 0 - 35 U/L _1 Alk Phosphatase 39 - 117 U/L 87 74 84  Total Bilirubin 0.2 - 1.2 mg/dL 0.5 0.8 0.4  Bilirubin, Direct 0.0 - 0.3 mg/dL 0.1 0.1 0.0    Lab Results  Component Value Date   HGBA1C 5.8 02/18/2019   Lab Results  Component Value Date   TSH 2.12 03/18/2020    No results  found.  Assessment and Plan:     ICD-10-CM   1. Healthcare maintenance  Z00.00    Doing very well at 89, her main issues now are vision loss secondary to macular degeneration.  I gave them some suggestions about manipulating Kindle settings that might enable her to read somewhat now.  Patient Instructions  [employer] kindle -   Can put on max bold setting with very large size letters.  The have a near-blindness setting  [employer] is the other settig that has the biggest words.     Health Maintenance Exam: The patient's preventative maintenance and recommended screening tests for an annual wellness exam were reviewed in full today. Brought up to date unless services declined.  Counselled on the importance of diet, exercise, and its role in overall health and mortality. The patient's FH and SH was reviewed, including their home life, tobacco status, and drug and alcohol status.  Follow-up in 1 year for physical exam or additional follow-up below.  I have personally reviewed the Medicare Annual Wellness questionnaire and have noted 1. The patient's medical and social history 2. Their use of alcohol, tobacco or illicit drugs 3. Their current medications and supplements 4. The patient's functional ability including ADL's, fall risks, home safety risks and hearing or visual             impairment. 5. Diet and physical activities 6. Evidence for depression or mood disorders 7. Reviewed Updated provider list, see scanned forms and [facility] Snapshot.  8. Reviewed whether or not the patient has HCPOA or living will, and discussed what this means with the patient.  Recommended she bring in a copy for his chart in [facility].  The patients weight, height, BMI and visual acuity have been recorded in the chart I have made referrals, counseling and provided education to the patient based review of  the above and I have provided the pt with a written personalized care plan for preventive services.  I have  provided the patient with a copy of your personalized plan for preventive services. Instructed to take the time to review along with their updated medication list.  Follow-up: Return in about 6 months (around 09/25/2020). Or follow-up in 1 year unless noted.  Future Appointments  Date Time Provider Impact  04/07/2020 10:15 AM Blythe Stanford, PT [facility] None  06/15/2020  1:40 PM End, Harrell Gave, MD [facility]    No orders of the defined types were placed in this encounter.  There are no discontinued medications. No orders of the defined types were placed in this encounter.   Signed,  Maud Deed. Lalani Winkles, MD   Allergies as of 03/25/2020      Reactions   Latex Rash   Tetanus Toxoid Anaphylaxis   REACTION: anaphylaxis   Alendronate Sodium    REACTION: passed out      Medication List       Accurate as of March 25, 2020  2:01 PM. If you have any questions, ask your nurse or doctor.        amLODipine 5 MG tablet Commonly known as: NORVASC Take 1 tablet (5 mg total) by mouth daily.   docusate sodium 100 MG capsule Commonly known as: COLACE Take 200 mg by mouth daily.   fluticasone 50 MCG/ACT nasal spray Commonly known as: FLONASE Use 2 spray(s) in each nostril once daily   ICaps Caps Take 1 capsule by mouth 2 (two) times daily.   lisinopril 40 MG tablet Commonly known as: ZESTRIL Take 1 tablet by mouth once daily   metoprolol succinate 25 MG 24 hr tablet Commonly known as: TOPROL-XL Take 1 tablet by mouth once daily   NON FORMULARY Vitamin D 50 mcg, take one daily by mouth   Vitamin D 50 MCG (2000 UT) Caps Take 1 capsule by mouth daily.   Xarelto 15 MG Tabs tablet Generic drug: Rivaroxaban TAKE 1 TABLET BY MOUTH ONCE DAILY WITH SUPPER

## 2020-03-25 ENCOUNTER — Ambulatory Visit (INDEPENDENT_AMBULATORY_CARE_PROVIDER_SITE_OTHER): Payer: PPO | Admitting: Family Medicine

## 2020-03-25 ENCOUNTER — Encounter: Payer: Self-pay | Admitting: Family Medicine

## 2020-03-25 ENCOUNTER — Other Ambulatory Visit: Payer: Self-pay

## 2020-03-25 VITALS — BP 138/84 | HR 67 | Temp 98.2°F | Ht <= 58 in | Wt 121.5 lb

## 2020-03-25 DIAGNOSIS — Z0000 Encounter for general adult medical examination without abnormal findings: Secondary | ICD-10-CM | POA: Diagnosis not present

## 2020-03-25 NOTE — Patient Instructions (Signed)
[employer] kindle -   Can put on max bold setting with very large size letters.  The have a near-blindness setting  [employer] is the other settig that has the biggest words.

## 2020-04-02 ENCOUNTER — Telehealth: Payer: Self-pay

## 2020-04-02 NOTE — Telephone Encounter (Signed)
Pt fell this morning on her bottom. Had some minimal bruising above the tailbone but using ice has made that subside. She is walking fine. Using her walker as much as possible. She is not having any pain but gave her Tylenol  A few minutes ago. Wanted Dr Patsy Lager to know.   Gave ER/UC precautions if symptoms change.

## 2020-04-07 ENCOUNTER — Ambulatory Visit: Payer: PPO

## 2020-05-10 ENCOUNTER — Other Ambulatory Visit: Payer: Self-pay | Admitting: Family Medicine

## 2020-06-01 ENCOUNTER — Telehealth: Payer: Self-pay | Admitting: Internal Medicine

## 2020-06-01 DIAGNOSIS — I4821 Permanent atrial fibrillation: Secondary | ICD-10-CM

## 2020-06-01 MED ORDER — RIVAROXABAN 15 MG PO TABS: ORAL_TABLET | ORAL | 1 refills | Status: DC

## 2020-06-01 NOTE — Telephone Encounter (Signed)
Prescription refill request for Xarelto received.  Indication: a fib Last office visit: Weight: 55kg Age: 86 Scr: 0.88 CrCl: 35 mL/min

## 2020-06-01 NOTE — Telephone Encounter (Signed)
Refill request

## 2020-06-01 NOTE — Telephone Encounter (Signed)
*  STAT* If patient is at the pharmacy, call can be transferred to refill team.   1. Which medications need to be refilled? (please list name of each medication and dose if known) xarelto 15 mg  2. Which pharmacy/location (including street and city if local pharmacy) is medication to be sent to? [facility] mail order  3. Do they need a 30 day or 90 day supply? 30

## 2020-06-09 ENCOUNTER — Telehealth: Payer: Self-pay | Admitting: *Deleted

## 2020-06-09 ENCOUNTER — Encounter: Payer: Self-pay | Admitting: Family Medicine

## 2020-06-09 ENCOUNTER — Other Ambulatory Visit: Payer: Self-pay

## 2020-06-09 ENCOUNTER — Ambulatory Visit (INDEPENDENT_AMBULATORY_CARE_PROVIDER_SITE_OTHER): Payer: PPO | Admitting: Family Medicine

## 2020-06-09 DIAGNOSIS — T148XXA Other injury of unspecified body region, initial encounter: Secondary | ICD-10-CM | POA: Diagnosis not present

## 2020-06-09 NOTE — Patient Instructions (Signed)
If you have other bleeding or bruising then let us know.  This will likely turn yellow/green then fade away.  Update [place] as needed.  Take care.  Glad to see you.

## 2020-06-09 NOTE — Telephone Encounter (Signed)
[facility] Day - Client TELEPHONE ADVICE RECORD AccessNurse Patient Name: Paula Luna Gender: Female DOB: 28-Sep-1924 Age: 86 Y 8 M 2 D Return Phone Number: [phone] (Primary), [phone] (Secondary) Address: [place] [address] Client [facility] Day - Client Client Site  [facility] [place] - Day Physician Copland, Karleen Hampshire - MD Contact Type Call Who Is Calling Patient / Member / Family / Caregiver Call Type Triage / Clinical Caller Name Roger Shelter Relationship To Patient Daughter Return Phone Number [phone] (Secondary) Chief Complaint Bruise Reason for Call Symptomatic / Request for Health Information Initial Comment caller states her moms hand is purple, she is on blood thinner and they aren't sure what is causing this Translation No Nurse Assessment Nurse: Kizzie Bane, RN, Marylene Land Date/Time (Eastern Time): 06/09/2020 9:01:20 AM Confirm and document reason for call. If symptomatic, describe symptoms. ---Caller states her Mother's thumb is purple on her left hand. The patient stated she didn't remember hitting her thumb. No fever Does the patient have any new or worsening symptoms? ---Yes Will a triage be completed? ---Yes Related visit to physician within the last 2 weeks? ---No Does the PT have any chronic conditions? (i.e. diabetes, asthma, this includes High risk factors for pregnancy, etc.) ---No Is this a behavioral health or substance abuse call? ---No Guidelines Guideline Title Affirmed Question Affirmed Notes Nurse Date/Time (Eastern Time) Bruises Taking Coumadin (warfarin) or other strong blood thinner, or known bleeding disorder (e.g., thrombocytopenia) Kizzie Bane, RN, Marylene Land 06/09/2020 9:03:13 AM Disp. Time Lamount Cohen Time) Disposition Final User 06/09/2020 8:58:46 AM Send to Urgent Queue Gayla Medicus 06/09/2020 9:07:08 AM See PCP within 24 Hours Yes Kizzie Bane, RN, Marylene Land PLEASE  NOTE: All timestamps contained within this report are represented as [place] Standard Time. CONFIDENTIALTY NOTICE: This fax transmission is intended only for the addressee. It contains information that is legally privileged, confidential or otherwise protected from use or disclosure. If you are not the intended recipient, you are strictly prohibited from reviewing, disclosing, copying using or disseminating any of this information or taking any action in reliance on or regarding this information. If you have received this fax in error, please notify us immediately by telephone so that we can arrange for its return to [place]. Phone: [phone], Toll-Free: [phone], Fax: [phone] Page: 2 of 2 Call Id: [id number] Caller Disagree/Comply Comply Caller Understands Yes PreDisposition Did not know what to do Care Advice Given Per Guideline SEE PCP WITHIN 24 HOURS: * IF OFFICE WILL BE OPEN: You need to be examined within the next 24 hours. Call your doctor (or NP/PA) when the office opens and make an appointment. CALL BACK IF: * You become worse CARE ADVICE given per Bruises (Adult) guideline. Referrals REFERRED TO PCP OFFICE

## 2020-06-09 NOTE — Telephone Encounter (Signed)
See OV note.  Thanks.  

## 2020-06-09 NOTE — Progress Notes (Signed)
This visit occurred during the SARS-CoV-2 public health emergency.  Safety protocols were in place, including screening questions prior to the visit, additional usage of staff PPE, and extensive cleaning of exam room while observing appropriate contact time as indicated for disinfecting solutions.  R thumb purplish, flexor and ext side.  On xarelto.  Noted last night.  Not painful.  No specific trauma.  May have caught her finger on a rack picking up a shoe in the closet but not ttp at the time.  No FCNAVD.  No other bleeding.  No nose bleed, no gum bleeding, etc.    Meds, vitals, and allergies reviewed.   ROS: Per HPI unless specifically indicated in ROS section   nad ncat Neck supple, no LA Right hand with normal inspection except for some bruising on the dorsal and palmar side of the thumb.  Does not involve the nailbed.  Nail is still intact.  Normal capillary refill.  Normal range of motion at the MCPs and IP joints.  Normal radial pulse.

## 2020-06-09 NOTE — Telephone Encounter (Addendum)
Pt has already seen Dr Para March this morning at 10:30.

## 2020-06-09 NOTE — Telephone Encounter (Signed)
Daughter left VM at Triage she is concerned because pt's thumb turned purple last night and she is worried about blood flow since pt's on blood thinner. Called [name] back and xfered to Access Nurse line so they can triage pt  FYI to PCP

## 2020-06-10 DIAGNOSIS — T148XXA Other injury of unspecified body region, initial encounter: Secondary | ICD-10-CM | POA: Insufficient documentation

## 2020-06-10 NOTE — Assessment & Plan Note (Signed)
This looks like a benign superficial bruise that does not need intervention.  No concern for tissue ischemia.  Her hand is not tender.  I think she likely bumped it getting shoes out of the closet.  She does not have any other bleeding.  I would still continue anticoagulation.  If she notices other bleeding then I want her to update [place].  Recent CBC without thrombocytopenia.  She agrees with plan.

## 2020-06-15 ENCOUNTER — Encounter: Payer: Self-pay | Admitting: Internal Medicine

## 2020-06-15 ENCOUNTER — Ambulatory Visit (INDEPENDENT_AMBULATORY_CARE_PROVIDER_SITE_OTHER): Payer: PPO | Admitting: Internal Medicine

## 2020-06-15 ENCOUNTER — Other Ambulatory Visit: Payer: Self-pay

## 2020-06-15 VITALS — BP 124/70 | HR 65 | Ht <= 58 in | Wt 123.2 lb

## 2020-06-15 DIAGNOSIS — Z23 Encounter for immunization: Secondary | ICD-10-CM | POA: Diagnosis not present

## 2020-06-15 DIAGNOSIS — I10 Essential (primary) hypertension: Secondary | ICD-10-CM | POA: Diagnosis not present

## 2020-06-15 DIAGNOSIS — I4821 Permanent atrial fibrillation: Secondary | ICD-10-CM

## 2020-06-15 NOTE — Progress Notes (Signed)
Follow-up Outpatient Visit Date: 06/15/2020  Primary Care Provider: Hannah Beat, MD [address] [place] [address]  Chief Complaint: Follow-up atrial fibrillation  HPI:  Paula Luna is a 86 y.o. 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  I last saw her in March at which time she was feeling well.  She noted some variability in her blood pressure readings as well as mild ankle edema.  We discussed escalation of amlodipine but agreed to defer this.  Today, Paula Luna reports she has been doing relatively well.  About a week ago, she noticed dark discoloration of her right thumb.  In hindsight, she believes that she may have pulled or bumped her thumb.  She has noted mild pain along the base of her thumb, though this is similar on the left side.  Discoloration has almost completely resolved.  Paula Luna has occasional transient lightheadedness and stable exertional dyspnea with modest activity.  She denies palpitations and chest pain.  She has occasional abdominal discomfort when she becomes constipated, though this has been fairly well managed with regular use of prune juice.  Paula Luna denies bleed, remaining on rivaroxaban.  --------------------------------------------------------------------------------------------------  Past Medical History:  Diagnosis Date   Anemia    Back pain    Hypertension 12/25/2013   Ischemic bowel disease (HCC)    a. 03/2017 abd pain-->internal herina and ischemic jejunum s/p ex lap and small bowel resection.   Macular degeneration of left eye 01/06/2015   Osteoporosis    Permanent atrial fibrillation (HCC)    a. First noted 03/2017;  b. 07/2017 Echo: EF 60-65%, no rwma, mild MR, mildly to mod dil LA; c. 07/2017 48hr Holter: persistent Afib, avg HR of 66 (range 39-140 bpm);  d. CHA2DS2VASc  = 4-->Xarelto initiated 07/2017.   Past Surgical History:  Procedure Laterality Date   CLOSED REDUCTION PROXIMAL HUMERUS FRACTURE Left 2017   LAPAROSCOPIC SMALL BOWEL RESECTION     LAPAROTOMY N/A 03/18/2017   Procedure: EXPLORATORY LAPAROTOMY, SMALL BOWEL RESECTION;  Surgeon: Leafy Ro, MD;  Location: [facility] ORS;  Service: General;  Laterality: N/A;   TOTAL ABDOMINAL HYSTERECTOMY      Current Meds  Medication Sig   amLODipine (NORVASC) 5 MG tablet Take 1 tablet (5 mg total) by mouth daily.   Cholecalciferol (VITAMIN D) 50 MCG (2000 UT) CAPS Take 1 capsule by mouth daily.   docusate sodium (COLACE) 100 MG capsule Take 200 mg by mouth daily.    fluticasone (FLONASE) 50 MCG/ACT nasal spray Use 2 spray(s) in each nostril once daily   lisinopril (ZESTRIL) 40 MG tablet Take 1 tablet by mouth once daily   metoprolol succinate (TOPROL-XL) 25 MG 24 hr tablet Take 1 tablet by mouth once daily   Multiple Vitamins-Minerals (ICAPS) CAPS Take 1 capsule by mouth 2 (two) times daily.    NON FORMULARY Vitamin D 50 mcg, take one daily by mouth   Rivaroxaban (XARELTO) 15 MG TABS tablet TAKE 1 TABLET BY MOUTH ONCE DAILY WITH SUPPER    Allergies: Latex, Tetanus toxoid, and Alendronate sodium  Social History   Tobacco Use   Smoking status: Former Smoker   Smokeless tobacco: Never Used  Building services engineer Use: Never used  Substance Use Topics   Alcohol use: No   Drug use: No    Family History  Problem Relation Age of Onset  Diabetes Mother    Cancer Father        jaw    Review of Systems: A 12-system review of systems was performed and was negative except as noted in the HPI.  --------------------------------------------------------------------------------------------------  Physical Exam: BP 124/70 (BP Location: Left Arm, Patient Position: Sitting, Cuff Size: Normal)    Pulse 65    Ht 4\' 10"  (1.473 m)    Wt 123 lb 4 oz (55.9 kg)    BMI 25.76 kg/m   General:  NAD. Neck: No JVD or HJR. Lungs: Clear to auscultation without wheezes or crackles. Heart: Irregularly irregular without murmurs, rubs, or gallops. Abdomen: Soft, nontender, nondistended. Extremities: Varicose veins noted.  Trace pretibial edema bilaterally.  EKG: Atrial fibrillation with septal Q waves.  No significant change from prior tracing on 12/11/2019.  Lab Results  Component Value Date   WBC 6.2 03/18/2020   HGB 12.9 03/18/2020   HCT 40.0 03/18/2020   MCV 79.6 03/18/2020   PLT 183.0 03/18/2020    Lab Results  Component Value Date   NA 141 03/18/2020   K 4.0 03/18/2020   CL 104 03/18/2020   CO2 29 03/18/2020   BUN 15 03/18/2020   CREATININE 0.88 03/18/2020   GLUCOSE 97 03/18/2020   ALT 9 03/18/2020    Lab Results  Component Value Date   CHOL 166 03/18/2020   HDL 63.10 03/18/2020   LDLCALC 86 03/18/2020   LDLDIRECT 101.2 05/20/2009   TRIG 84.0 03/18/2020   CHOLHDL 3 03/18/2020    --------------------------------------------------------------------------------------------------  ASSESSMENT AND PLAN: Permanent atrial fibrillation: No significant symptoms reported with good rate control.  Continue metoprolol succinate 25 mg daily and rivaroxaban 15 mg daily (adjusted for creatinine clearance less than 50).  I suspect that recent transient thumb discoloration was bruising in the setting of trauma while on anticoagulation.  Capillary refill appears normal on examination today.  Hypertension: Blood pressure well controlled.  We will continue current doses of amlodipine, lisinopril, and metoprolol.  Influenza immunization: Seasonal flu vaccine administered at the patient's request.  Follow-up: Return to clinic in 6 months.  05/19/2020, MD 06/15/2020 2:37 PM

## 2020-06-15 NOTE — Patient Instructions (Signed)
Medication Instructions:  Your physician recommends that you continue on your current medications as directed. Please refer to the Current Medication list given to you today.  *If you need a refill on your cardiac medications before your next appointment, please call your pharmacy*   Follow-Up: At [facility], you and your health needs are our priority.  As part of our continuing mission to provide you with exceptional heart care, we have created designated Provider Care Teams.  These Care Teams include your primary Cardiologist (physician) and Advanced Practice Providers (APPs -  Physician Assistants and Nurse Practitioners) who all work together to provide you with the care you need, when you need it.  We recommend signing up for the patient portal called "MyChart".  Sign up information is provided on this After Visit Summary.  MyChart is used to connect with patients for Virtual Visits (Telemedicine).  Patients are able to view lab/test results, encounter notes, upcoming appointments, etc.  Non-urgent messages can be sent to your provider as well.   To learn more about what you can do with MyChart, go to [email or web address].    Your next appointment:   6 month(s)  The format for your next appointment:   In Person  Provider:   You may see Yvonne Kendall, MD or one of the following Advanced Practice Providers on your designated Care Team:    Nicolasa Ducking, NP  Eula Listen, PA-C  Marisue Ivan, PA-C  Cadence Fransico Michael, [place]    Influenza (Flu) Vaccine (Inactivated or Recombinant): What You Need to Know 1. Why get vaccinated? Influenza vaccine can prevent influenza (flu). Flu is a contagious disease that spreads around the [place] every year, usually between October and May. Anyone can get the flu, but it is more dangerous for some people. Infants and young children, people 51 years of age and older, pregnant women, and people with certain health conditions or a  weakened immune system are at greatest risk of flu complications. Pneumonia, bronchitis, sinus infections and ear infections are examples of flu-related complications. If you have a medical condition, such as heart disease, cancer or diabetes, flu can make it worse. Flu can cause fever and chills, sore throat, muscle aches, fatigue, cough, headache, and runny or stuffy nose. Some people may have vomiting and diarrhea, though this is more common in children than adults. Each year thousands of people in the [place] die from flu, and many more are hospitalized. Flu vaccine prevents millions of illnesses and flu-related visits to the doctor each year. 2. Influenza vaccine CDC recommends everyone 6 months of age and older get vaccinated every flu season. Children 6 months through 72 years of age may need 2 doses during a single flu season. Everyone else needs only 1 dose each flu season. It takes about 2 weeks for protection to develop after vaccination. There are many flu viruses, and they are always changing. Each year a new flu vaccine is made to protect against three or four viruses that are likely to cause disease in the upcoming flu season. Even when the vaccine doesn't exactly match these viruses, it may still provide some protection. Influenza vaccine does not cause flu. Influenza vaccine may be given at the same time as other vaccines. 3. Talk with your health care provider Tell your vaccine provider if the person getting the vaccine:  Has had an allergic reaction after a previous dose of influenza vaccine, or has any severe, life-threatening allergies.  Has ever had Guillain-Barr Syndrome (also called  GBS). In some cases, your health care provider may decide to postpone influenza vaccination to a future visit. People with minor illnesses, such as a cold, may be vaccinated. People who are moderately or severely ill should usually wait until they recover before getting influenza  vaccine. Your health care provider can give you more information. 4. Risks of a vaccine reaction  Soreness, redness, and swelling where shot is given, fever, muscle aches, and headache can happen after influenza vaccine.  There may be a very small increased risk of Guillain-Barr Syndrome (GBS) after inactivated influenza vaccine (the flu shot). Young children who get the flu shot along with pneumococcal vaccine (PCV13), and/or DTaP vaccine at the same time might be slightly more likely to have a seizure caused by fever. Tell your health care provider if a child who is getting flu vaccine has ever had a seizure. People sometimes faint after medical procedures, including vaccination. Tell your provider if you feel dizzy or have vision changes or ringing in the ears. As with any medicine, there is a very remote chance of a vaccine causing a severe allergic reaction, other serious injury, or death. 5. What if there is a serious problem? An allergic reaction could occur after the vaccinated person leaves the clinic. If you see signs of a severe allergic reaction (hives, swelling of the face and throat, difficulty breathing, a fast heartbeat, dizziness, or weakness), call 9-1-1 and get the person to the nearest hospital. For other signs that concern you, call your health care provider. Adverse reactions should be reported to the Vaccine Adverse Event Reporting System (VAERS). Your health care provider will usually file this report, or you can do it yourself. Visit the VAERS website at www.vaers.LAgents.no or call 2012370490.VAERS is only for reporting reactions, and VAERS staff do not give medical advice. 6. The National Vaccine Injury Compensation Program The [employer] Vaccine Injury Compensation Program (VICP) is a federal program that was created to compensate people who may have been injured by certain vaccines. Visit the VICP website at SpiritualWord.at or call (434) 308-1462 to learn  about the program and about filing a claim. There is a time limit to file a claim for compensation. 7. How can I learn more?  Ask your healthcare provider.  Call your local or state health department.  Contact the Centers for Disease Control and Prevention (CDC): ? Call (848) 660-3109 (1-800-CDC-INFO) or ? Visit CDC's BiotechRoom.com.cy Vaccine Information Statement (Interim) Inactivated Influenza Vaccine (04/26/2018) This information is not intended to replace advice given to you by your health care provider. Make sure you discuss any questions you have with your health care provider. Document Revised: 12/18/2018 Document Reviewed: 04/30/2018 Elsevier Patient Education  2020 [employer].

## 2020-06-17 DIAGNOSIS — H524 Presbyopia: Secondary | ICD-10-CM | POA: Diagnosis not present

## 2020-06-17 DIAGNOSIS — H0288A Meibomian gland dysfunction right eye, upper and lower eyelids: Secondary | ICD-10-CM | POA: Diagnosis not present

## 2020-06-17 DIAGNOSIS — H0100A Unspecified blepharitis right eye, upper and lower eyelids: Secondary | ICD-10-CM | POA: Diagnosis not present

## 2020-06-17 DIAGNOSIS — H5201 Hypermetropia, right eye: Secondary | ICD-10-CM | POA: Diagnosis not present

## 2020-06-17 DIAGNOSIS — H52201 Unspecified astigmatism, right eye: Secondary | ICD-10-CM | POA: Diagnosis not present

## 2020-06-17 DIAGNOSIS — H353112 Nonexudative age-related macular degeneration, right eye, intermediate dry stage: Secondary | ICD-10-CM | POA: Diagnosis not present

## 2020-06-17 DIAGNOSIS — H43393 Other vitreous opacities, bilateral: Secondary | ICD-10-CM | POA: Diagnosis not present

## 2020-06-17 DIAGNOSIS — H02834 Dermatochalasis of left upper eyelid: Secondary | ICD-10-CM | POA: Diagnosis not present

## 2020-06-17 DIAGNOSIS — H0288B Meibomian gland dysfunction left eye, upper and lower eyelids: Secondary | ICD-10-CM | POA: Diagnosis not present

## 2020-06-17 DIAGNOSIS — H35033 Hypertensive retinopathy, bilateral: Secondary | ICD-10-CM | POA: Diagnosis not present

## 2020-06-17 DIAGNOSIS — H02831 Dermatochalasis of right upper eyelid: Secondary | ICD-10-CM | POA: Diagnosis not present

## 2020-06-17 DIAGNOSIS — H353124 Nonexudative age-related macular degeneration, left eye, advanced atrophic with subfoveal involvement: Secondary | ICD-10-CM | POA: Diagnosis not present

## 2020-06-17 DIAGNOSIS — H11823 Conjunctivochalasis, bilateral: Secondary | ICD-10-CM | POA: Diagnosis not present

## 2020-06-22 ENCOUNTER — Telehealth: Payer: Self-pay | Admitting: *Deleted

## 2020-06-22 NOTE — Telephone Encounter (Signed)
"  Please give me a call back.  I'm trying to reach Dr. Geryl Rankins office."

## 2020-06-24 ENCOUNTER — Other Ambulatory Visit: Payer: Self-pay

## 2020-06-24 ENCOUNTER — Encounter: Payer: Self-pay | Admitting: Podiatry

## 2020-06-24 ENCOUNTER — Ambulatory Visit: Payer: PPO | Admitting: Podiatry

## 2020-06-24 DIAGNOSIS — B351 Tinea unguium: Secondary | ICD-10-CM | POA: Diagnosis not present

## 2020-06-24 DIAGNOSIS — M79676 Pain in unspecified toe(s): Secondary | ICD-10-CM

## 2020-06-24 NOTE — Progress Notes (Signed)
Subjective:  Patient ID: Paula Luna, female    DOB: [birth year]  MRN: [id number] HPI Chief Complaint  Patient presents with  . Toe Pain    Toenails - says she has ingrowns on hallux nails bilateral, been getting them cut at the salon, but on blood thinners and makes her nervous  . New Patient (Initial Visit)    Est pt 2017    86 y.o. female presents with the above complaint.   ROS: Denies fever chills nausea vomiting muscle aches pains calf pain back pain chest pain shortness of breath.  Past Medical History:  Diagnosis Date  . Anemia   . Back pain   . Hypertension 12/25/2013  . Ischemic bowel disease (HCC)    a. 03/2017 abd pain-->internal herina and ischemic jejunum s/p ex lap and small bowel resection.  . Macular degeneration of left eye 01/06/2015  . Osteoporosis   . Permanent atrial fibrillation (HCC)    a. First noted 03/2017;  b. 07/2017 Echo: EF 60-65%, no rwma, mild MR, mildly to mod dil LA; c. 07/2017 48hr Holter: persistent Afib, avg HR of 66 (range 39-140 bpm);  d. CHA2DS2VASc = 4-->Xarelto initiated 07/2017.   Past Surgical History:  Procedure Laterality Date  . CLOSED REDUCTION PROXIMAL HUMERUS FRACTURE Left 2017  . LAPAROSCOPIC SMALL BOWEL RESECTION    . LAPAROTOMY N/A 03/18/2017   Procedure: EXPLORATORY LAPAROTOMY, SMALL BOWEL RESECTION;  Surgeon: Leafy Ro, MD;  Location: [facility] ORS;  Service: General;  Laterality: N/A;  . TOTAL ABDOMINAL HYSTERECTOMY      Current Outpatient Medications:  .  amLODipine (NORVASC) 5 MG tablet, Take 1 tablet (5 mg total) by mouth daily., Disp: 90 tablet, Rfl: 2 .  Cholecalciferol (VITAMIN D) 50 MCG (2000 UT) CAPS, Take 1 capsule by mouth daily., Disp: , Rfl:  .  docusate sodium (COLACE) 100 MG capsule, Take 200 mg by mouth daily. , Disp: , Rfl:  .  fluticasone (FLONASE) 50 MCG/ACT nasal spray, Use 2 spray(s) in each nostril once daily, Disp: 48 g, Rfl: 0 .  lisinopril (ZESTRIL) 40 MG tablet, Take 1 tablet by mouth once  daily, Disp: 90 tablet, Rfl: 2 .  metoprolol succinate (TOPROL-XL) 25 MG 24 hr tablet, Take 1 tablet by mouth once daily, Disp: 90 tablet, Rfl: 1 .  Multiple Vitamins-Minerals (ICAPS) CAPS, Take 1 capsule by mouth 2 (two) times daily. , Disp: , Rfl:  .  NON FORMULARY, Vitamin D 50 mcg, take one daily by mouth, Disp: , Rfl:  .  Rivaroxaban (XARELTO) 15 MG TABS tablet, TAKE 1 TABLET BY MOUTH ONCE DAILY WITH SUPPER, Disp: 90 tablet, Rfl: 1  Allergies  Allergen Reactions  . Latex Rash  . Tetanus Toxoid Anaphylaxis    REACTION: anaphylaxis  . Alendronate Sodium     REACTION: passed out   Review of Systems Objective:  There were no vitals filed for this visit.  General: Well developed, nourished, in no acute distress, alert and oriented x3   Dermatological: Skin is warm, dry and supple bilateral. Nails x 10 are thick yellow dystrophic-like mycotic painful ingrown and tender on palpation and debridement.; remaining integument appears unremarkable at this time. There are no open sores, no preulcerative lesions, no rash or signs of infection present.  Vascular: Dorsalis Pedis artery and Posterior Tibial artery pedal pulses are 2/4 bilateral with immedate capillary fill time. Pedal hair growth present. No varicosities and no lower extremity edema present bilateral.   Neruologic: Grossly intact via light touch bilateral.  Vibratory intact via tuning fork bilateral. Protective threshold with Semmes Wienstein monofilament intact to all pedal sites bilateral. Patellar and Achilles deep tendon reflexes 2+ bilateral. No Babinski or clonus noted bilateral.   Musculoskeletal: No gross boney pedal deformities bilateral. No pain, crepitus, or limitation noted with foot and ankle range of motion bilateral. Muscular strength 5/5 in all groups tested bilateral.  Gait: Unassisted, Nonantalgic.    Radiographs:  None taken  Assessment & Plan:   Assessment: Pain in limb secondary to onychomycosis.  Plan:  Debridement of toenails 1 through 5 bilateral.     Khale Nigh T. [place], [place]

## 2020-06-25 NOTE — Telephone Encounter (Signed)
PATIENT CAME IN FOR AN APPOINTMENT ON 06/24/2020.

## 2020-07-12 ENCOUNTER — Other Ambulatory Visit: Payer: Self-pay | Admitting: Internal Medicine

## 2020-07-13 NOTE — Telephone Encounter (Signed)
Rx request sent to pharmacy.  

## 2020-07-20 ENCOUNTER — Telehealth: Payer: Self-pay | Admitting: Internal Medicine

## 2020-07-20 NOTE — Telephone Encounter (Signed)
Patient daughter [name] calling  Would like to speak with Victorino Dike, no details were given when asked Please call to discuss

## 2020-07-20 NOTE — Telephone Encounter (Signed)
Patient's daughter calling. They have all but 9 tablets needed to get to the end of the year with the Xarelto.  It is expensive right now because she is in the donut hole. She asked if we had a few samples. We did have samples enough to get to the end of the year. In the new year, the price will decrease for patient. She was very Adult nurse.   Medication Samples have left at front desk for patient to pick up.  Drug name: Xarelto       Strength: 15 mg        Qty: 3 bottles  LOT: [id number]  Exp.Date: 03/23

## 2020-08-04 ENCOUNTER — Other Ambulatory Visit: Payer: Self-pay | Admitting: Family Medicine

## 2020-08-24 ENCOUNTER — Telehealth: Payer: Self-pay

## 2020-08-24 NOTE — Telephone Encounter (Signed)
I spoke with [name] pt fell on 08/21/20; today pt is walking short distance with walker; pt still has pain and swelling in knee; pain level is 3 now; pt has been taking regular strength tylenol once or twice a day and that is helping with pain; alternating ice and heat; pt is getting to bathroom with walker and assistance; [name] has a new job and cannot get off work until 08/26/20. Pt already has appt scheduled with Dr Patsy Lager on 08/26/20 at 8:40 AM;pt has not gone to [facility] or ED. UC & ED precautions given and [name] voiced understanding. FYI to Dr Patsy Lager.

## 2020-08-24 NOTE — Telephone Encounter (Signed)
[facility] Night - Client TELEPHONE ADVICE RECORD AccessNurse Patient Name: Paula Luna Gender: Female DOB: [birth year] Age: 86 Y 10 M 15 D Return Phone Number: [phone] (Primary), [phone] (Secondary) Address: [place] [address] Client [place] [facility] Night - Client Client Site  [facility] [place] - Night Physician Copland, Karleen Hampshire - MD Contact Type Call Who Is Calling Patient / Member / Family / Caregiver Call Type Triage / Clinical Caller Name Roger Shelter Relationship To Patient Daughter Return Phone Number [phone] (Secondary) Chief Complaint Knee Injury Reason for Call Symptomatic / Request for Health Information Initial Comment Caller states her mom was taking a bath last night when she had a slip and fall, hitting right knee on the bottom of the tub. Her knee is bruised. Been putting ice on it since last night and today, on and off. GOTO Facility Not Listed [facility] [place] Translation No Nurse Assessment Nurse: Josephine Cables, RN, Caitlin Date/Time (Eastern Time): 08/22/2020 5:20:21 PM Confirm and document reason for call. If symptomatic, describe symptoms. ---Caller states her mom was taking a bath last night when she had a slip and fall, hitting right knee on the bottom of the tub. Had to call 911 due to unable to get her out of the tub. EMT came and assessed her, helped get her out of the bathroom, and put her in the bed. Pt denied wanting to go to the hospital and caller states EMT and fire department did not think she needed to go be seen. Her knee is bruised and swollen. Been putting ice and heat on it since last night and today, on and off. Pt got out of bed today and walked with walker, does have some pain (4/10), no worsening to the bruising. Does the patient have any new or worsening symptoms? ---Yes Will a triage be completed? ---Yes Related visit to physician within the  last 2 weeks? ---N/A Does the PT have any chronic conditions? (i.e. diabetes, asthma, this includes High risk factors for pregnancy, etc.) ---Yes List chronic conditions. ---had sx 4 yrs ago removed small intestine, has a-fib does take blood thinners, HTN, Is this a behavioral health or substance abuse call? ---No PLEASE NOTE: All timestamps contained within this report are represented as [place] Standard Time. CONFIDENTIALTY NOTICE: This fax transmission is intended only for the addressee. It contains information that is legally privileged, confidential or otherwise protected from use or disclosure. If you are not the intended recipient, you are strictly prohibited from reviewing, disclosing, copying using or disseminating any of this information or taking any action in reliance on or regarding this information. If you have received this fax in error, please notify us immediately by telephone so that we can arrange for its return to [place]. Phone: [phone], Toll-Free: [phone], Fax: [phone] Page: 2 of 2 Call Id: [id number] Guidelines Guideline Title Affirmed Question Affirmed Notes Nurse Date/Time Lamount Cohen Time) Knee Injury [1] High-risk adult (e.g., age > 60 years, osteoporosis, chronic steroid use) AND [2] limping Josephine Cables, RN, Caitlin 08/22/2020 5:20:26 PM Disp. Time Lamount Cohen Time) Disposition Final User 08/22/2020 4:54:01 PM Attempt made - message left Stark Jock, Candace 08/22/2020 5:58:50 PM See PCP within 24 Hours Yes Josephine Cables, RN, Glori Luis Disagree/Comply Disagree Caller Understands Yes PreDisposition InappropriateToAsk Care Advice Given Per Guideline SEE PCP WITHIN 24 HOURS: * IF OFFICE WILL BE OPEN: You need to be examined within the next 24 hours. Call your doctor (or NP/PA) when the office opens and make an  appointment. CALL BACK IF: * Pain becomes severe * You become worse CARE ADVICE given per Knee Injury (Adult) guideline. Comments User: Maricela Curet, RN  Date/Time Lamount Cohen Time): 08/22/2020 6:01:02 PM Caller states will continue monitoring pt and take her to the Dr on monday however if anything gets worse she will have her be seen over the weekend or call back. Referrals REFERRED TO PCP OFFICE GO TO FACILITY OTHER - SPECIFY

## 2020-08-26 ENCOUNTER — Ambulatory Visit (INDEPENDENT_AMBULATORY_CARE_PROVIDER_SITE_OTHER): Payer: PPO | Admitting: Family Medicine

## 2020-08-26 ENCOUNTER — Other Ambulatory Visit: Payer: Self-pay

## 2020-08-26 ENCOUNTER — Ambulatory Visit (INDEPENDENT_AMBULATORY_CARE_PROVIDER_SITE_OTHER)
Admission: RE | Admit: 2020-08-26 | Discharge: 2020-08-26 | Disposition: A | Discharge status: Home / Self Care | Payer: PPO | Source: Ambulatory Visit | Attending: Family Medicine | Admitting: Family Medicine

## 2020-08-26 ENCOUNTER — Encounter: Payer: Self-pay | Admitting: Family Medicine

## 2020-08-26 VITALS — BP 140/80 | HR 72 | Temp 97.6°F | Ht 58.6 in | Wt 121.8 lb

## 2020-08-26 DIAGNOSIS — W19XXXA Unspecified fall, initial encounter: Secondary | ICD-10-CM

## 2020-08-26 DIAGNOSIS — I708 Atherosclerosis of other arteries: Secondary | ICD-10-CM | POA: Diagnosis not present

## 2020-08-26 DIAGNOSIS — L8941 Pressure ulcer of contiguous site of back, buttock and hip, stage 1: Secondary | ICD-10-CM

## 2020-08-26 DIAGNOSIS — M25561 Pain in right knee: Secondary | ICD-10-CM | POA: Diagnosis not present

## 2020-08-26 DIAGNOSIS — L989 Disorder of the skin and subcutaneous tissue, unspecified: Secondary | ICD-10-CM | POA: Diagnosis not present

## 2020-08-26 DIAGNOSIS — S82091A Other fracture of right patella, initial encounter for closed fracture: Secondary | ICD-10-CM | POA: Diagnosis not present

## 2020-08-26 DIAGNOSIS — S82014A Nondisplaced osteochondral fracture of right patella, initial encounter for closed fracture: Secondary | ICD-10-CM | POA: Diagnosis not present

## 2020-08-26 DIAGNOSIS — S82031A Displaced transverse fracture of right patella, initial encounter for closed fracture: Secondary | ICD-10-CM | POA: Diagnosis not present

## 2020-08-26 NOTE — Patient Instructions (Signed)
Take Tylenol/Acetaminophen ES (500mg ) 2 tabs by mouth three times a day max as needed.  Avoid Ibuprofen and Alleve (generic naproxen)

## 2020-08-26 NOTE — Progress Notes (Addendum)
Paula Bradwell T. Anet Logsdon, MD, CAQ Sports Medicine  Primary Care and Sports Medicine [facility] [address] [place], [address]  Phone: [phone]  FAX: [phone]  Paula Luna - 86 y.o. female  MRN [id number]  Date of Birth: Oct 25, 1924  Date: 08/26/2020  PCP: Hannah Beat, MD  Referral: Hannah Beat, MD  Chief Complaint  Patient presents with  . Fall    Friday night    This visit occurred during the SARS-CoV-2 public health emergency.  Safety protocols were in place, including screening questions prior to the visit, additional usage of staff PPE, and extensive cleaning of exam room while observing appropriate contact time as indicated for disinfecting solutions.   Subjective:   Paula Luna is a 86 y.o. very pleasant female patient with Body mass index is 24.93 kg/m. who presents with the following:  DOI: 08/21/2020  86 year old who fell on Friday night.    Was getting out of the tub and her left leg was down, then fell down.  Has been able to walk with a walker.  FIreman came, and helped her get up.  Has handrails in her tub.  She landed directly on the anterior aspect of the knee.  She has some diffuse bruising as well as swelling throughout the anterior knee.  She is here with her daughter, and she is sitting in a wheelchair in our office.  She required a wheelchair to get out of her car, and 2 people of our nursing staff were needed to get her in her wheelchair.  She reports that she has been partially weightbearing using her walker.  CHECK BROWN PLACE ON UPPER THIGH -- l thigh brown / black place about the size of a penny Her daughter was concerned about this, and she wanted me to look.  It does not cause any pain and is not itching.  She has had this for approximately 4 to 5 years.  There is also an area in the posterior buttocks that the daughter was worried about  Patient Active Problem List   Diagnosis  Date Noted  . Atrial fibrillation (HCC) 04/26/2017  . 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 Macular degeneration of left eye 01/06/2015  . Essential hypertension 12/25/2013    Past Medical History:  Diagnosis Date  . Hypertension 12/25/2013  . Ischemic bowel disease (HCC)    a. 03/2017 abd pain-->internal herina and ischemic jejunum s/p ex lap and small bowel resection.  . Macular degeneration of left eye 01/06/2015  . Osteoporosis   . Permanent atrial fibrillation (HCC)    a. First noted 03/2017;  b. 07/2017 Echo: EF 60-65%, no rwma, mild MR, mildly to mod dil LA; c. 07/2017 48hr Holter: persistent Afib, avg HR of 66 (range 39-140 bpm);  d. CHA2DS2VASc = 4-->Xarelto initiated 07/2017.    Past Surgical History:  Procedure Laterality Date  . CLOSED REDUCTION PROXIMAL HUMERUS FRACTURE Left 2017  . LAPAROSCOPIC SMALL BOWEL RESECTION    . LAPAROTOMY N/A 03/18/2017   Procedure: EXPLORATORY LAPAROTOMY, SMALL BOWEL RESECTION;  Surgeon: Leafy Ro, MD;  Location: [facility] ORS;  Service: General;  Laterality: N/A;  . TOTAL ABDOMINAL HYSTERECTOMY      Family History  Problem Relation Age of Onset  . Diabetes Mother   . Cancer Father        jaw     Review of Systems is noted in the  HPI, as appropriate   Objective:   BP 140/80   Pulse 72   Temp 97.6 F (36.4 C) (Skin)   Ht 4' 10.6" (1.488 m)   Wt 121 lb 12 oz (55.2 kg)   BMI 24.93 kg/m   Throughout my exam, the patient is seated in a wheelchair.  She appears to be in no acute distress.  At the right knee there is some diffuse swelling and extensive bruising.  The distal tibia and fibula are nontender as well as the midshaft and the proximal tibia and fibula acute including the tibial plateau.  She is not tender throughout the femur.  She is point tender at the patella and she also has some tenderness medial and lateral to the patella.  She has modest tenderness at the joint  lines.  Additional movement at the knee was limited secondary to pain but she was able to at least bend and flex the knee approximately 40 degrees.  Special testing is limited.  Chaperoned exam of the proximal thigh and inner groin shows a black lesion that is raised and it is approximately the size of a nickel.  There is no evidence of excoriation or verrucous appearance.  It is raised and smooth.  Chaperoned exam, posterior buttocks there is an area of slight discoloration without ulcer formation.  Radiology: DG Knee Complete 4 Views Right  Result Date: 08/26/2020 CLINICAL DATA:  Larey Seat with patellar pain EXAM: RIGHT KNEE - COMPLETE 4+ VIEW COMPARISON:  None. FINDINGS: Transverse fracture of the mid to lower portion of the patella with only about 3 mm of distraction. Joint effusion. Mild chronic medial compartment joint space narrowing. No tibial, femoral or fibular fracture. Regional arterial calcification incidentally noted. IMPRESSION: Transverse fracture of the mid to lower portion of the patella with about 3 mm of distraction. Electronically Signed   By: Paulina Fusi M.D.   On: 08/26/2020 09:34    Assessment and Plan:     ICD-10-CM   1. Closed patellar sleeve fracture, right, initial encounter  S82.091A Ambulatory referral to Orthopedic Surgery    Ambulatory referral to Home Health  2. Acute pain of right knee  M25.561 DG Knee Complete 4 Views Right    Ambulatory referral to Orthopedic Surgery    Ambulatory referral to Home Health  3. Fall, initial encounter  W19.Lorne Skeens DG Knee Complete 4 Views Right    Ambulatory referral to Orthopedic Surgery    Ambulatory referral to Home Health  4. Skin lesion of left leg  L98.9 Ambulatory referral to Home Health  5. Pressure injury of contiguous region involving back and buttock, stage 1, unspecified laterality  L89.41 Ambulatory referral to Home Health   Total encounter time: 40 minutes. This includes total time spent on the day of encounter.   Very long conversation with the patient and her daughter about multiple issues including her current ongoing pain, patellar fracture.  I reviewed the x-rays independently and also went over them with the patient and her daughter.  There is some distraction of the transverse fracture, and I agree with radiological interpretation.  Additional time spent on skin evaluation.  I do think that this is a concerning lesion, and I did recommend to her that she follow-up with gynecology about it.  At this point she is not interested given her age, and she would like to basically not do anything about it.  As long as she understands these risk, then I think that she is of sound mind and can  make these decisions.  She also has what looks like a very early pressure change in the posterior buttocks.  I recommended to the patient and her daughter that she move and change positions every 1-2 hours.  I think that home health would be appropriate in this case, but I do think getting the opinion of orthopedics first would be more appropriate.  I do not think it is in the patient's best interest to have surgery, and both the patient and her daughter agree with me here.  There are no discontinued medications. Orders Placed This Encounter  Procedures  . DG Knee Complete 4 Views Right  . Ambulatory referral to Orthopedic Surgery  . Ambulatory referral to Home Health    Follow-up: No follow-ups on file.  Signed,  Elpidio Galea. Tamaj Jurgens, MD   Outpatient Encounter Medications as of 08/26/2020  Medication Sig  . amLODipine (NORVASC) 5 MG tablet Take 1 tablet by mouth once daily  . Cholecalciferol (VITAMIN D) 50 MCG (2000 UT) CAPS Take 1 capsule by mouth daily.  Marland Kitchen docusate sodium (COLACE) 100 MG capsule Take 200 mg by mouth daily.  . fluticasone (FLONASE) 50 MCG/ACT nasal spray Use 2 spray(s) in each nostril once daily  . lisinopril (ZESTRIL) 40 MG tablet Take 1 tablet by mouth once daily  . metoprolol succinate  (TOPROL-XL) 25 MG 24 hr tablet Take 1 tablet by mouth once daily  . Multiple Vitamins-Minerals (ICAPS) CAPS Take 1 capsule by mouth 2 (two) times daily.   . NON FORMULARY Vitamin D 50 mcg, take one daily by mouth  . Rivaroxaban (XARELTO) 15 MG TABS tablet TAKE 1 TABLET BY MOUTH ONCE DAILY WITH SUPPER   No facility-administered encounter medications on file as of 08/26/2020.

## 2020-08-27 ENCOUNTER — Telehealth: Payer: Self-pay | Admitting: Family Medicine

## 2020-08-27 DIAGNOSIS — L8941 Pressure ulcer of contiguous site of back, buttock and hip, stage 1: Secondary | ICD-10-CM

## 2020-08-27 DIAGNOSIS — L989 Disorder of the skin and subcutaneous tissue, unspecified: Secondary | ICD-10-CM

## 2020-08-27 DIAGNOSIS — S82091A Other fracture of right patella, initial encounter for closed fracture: Secondary | ICD-10-CM

## 2020-08-27 DIAGNOSIS — M25561 Pain in right knee: Secondary | ICD-10-CM

## 2020-08-27 DIAGNOSIS — W19XXXA Unspecified fall, initial encounter: Secondary | ICD-10-CM

## 2020-08-27 MED ORDER — TRAMADOL HCL 50 MG PO TABS
50.0000 mg | ORAL_TABLET | Freq: Three times a day (TID) | ORAL | 0 refills | Status: DC | PRN
Start: 2020-08-27 — End: 2020-12-30

## 2020-08-27 NOTE — Telephone Encounter (Signed)
Please Dr Cyndie Chime note below the access nurse note.

## 2020-08-27 NOTE — Telephone Encounter (Signed)
[place] [facility] Night - Client Nonclinical Telephone Record AccessNurse Client [name] [facility] Night - Client Client Site [facility] [place] - Night Physician Hannah Beat - MD Contact Type Call Who Is Calling Patient / Member / Family / Caregiver Caller Name Roger Shelter Caller Phone Number [phone] Patient Name Paula Luna Patient DOB 07-08-1925 Call Type Message Only Information Provided Reason for Call Request for General Office Information Initial Comment Caller states she needs to speak with Dr copland Nurse Additional Comment Provided office hours Disp. Time Disposition Final User 08/26/2020 5:06:58 PM General Information Provided Yes Fenton Foy Call Closed By: Fenton Foy Transaction Date/Time: 08/26/2020 5:01:26 PM (ET)

## 2020-08-27 NOTE — Telephone Encounter (Signed)
Patients daughter called stating that she took her mother to [facility] yesterday. They put her mom in a full brace. She called to see if there is something she can take something stronger than just regular tylenol every 6 hours. She states that she is in some pain and doesn't know if there is something else she can take. She has questions about icing the knee and if there is something she can put to protect her back where she has thin skin. Please advise. EM

## 2020-08-27 NOTE — Addendum Note (Signed)
Addended by: Hannah Beat on: 08/27/2020 08:35 AM   Modules accepted: Orders

## 2020-08-27 NOTE — Telephone Encounter (Signed)
Please call daughter  She can ice her knee for up to 20 minutes several times a day.  This should help with swelling and pain.  I sent in some tramadol for pain.  I would try a "hydrocolloid dressing" - she will probably need to ask at the pharmacy.  I would probably try regular cotton underwear if possible and using a bedside toilet - then she will have to move only a few feet.

## 2020-08-27 NOTE — Telephone Encounter (Signed)
Spoke to daughter. She is asking if Dr Patsy Lager could initiate home health to include [facility] aide. Daughter needs to be able to go to work. Needs someone to help her in the mornings. I advised this is usually something that would be out of pocket and also require a F2F.

## 2020-08-27 NOTE — Telephone Encounter (Signed)
Called and spoke with patient in the absence of her daughter [name] and updated her of the information. Patient verbalized understanding and stated that she would let her daughter know this information.

## 2020-08-27 NOTE — Telephone Encounter (Signed)
Can you tell Paula Luna that I just put the orders in - I asked them to see you in a couple of days.  That being said, home health is very behind right now, so it will hopefully be around that time.

## 2020-08-27 NOTE — Telephone Encounter (Signed)
Daughter [name] left VM at Triage asking [place] to email her due to her not being able to take calls at work, she left email on VM but unable to tell what the email was due to her connection   FYI to [employer]

## 2020-08-27 NOTE — Addendum Note (Signed)
Addended by: Hannah Beat on: 08/27/2020 01:10 PM   Modules accepted: Orders

## 2020-08-28 NOTE — Telephone Encounter (Signed)
Called daughter to gather further information. No answer. Voicemail left to call office back.

## 2020-08-28 NOTE — Telephone Encounter (Signed)
Pt duagter called back wanted to know about what cream to use on her bottom, and also about the home health help

## 2020-08-31 NOTE — Telephone Encounter (Signed)
Please call  I am sorry if I was not clear.  No need to put special creams or ointments.  Keep the area clean and place a "hydrocolloid dressing" over the area.  You can get them at any pharmacy - ask for help.  The home health nurse can assess if anything else is needed.  There is a massive nursing and home health shortage right now in our area.  I hope they will see Paula Luna soon.    Sitters and home aids are very rarely covered by insurance.  It is possible but rare.

## 2020-08-31 NOTE — Telephone Encounter (Addendum)
Patient's daughter left a voicemail requesting a call back. [name] stated that she is not sure what she was to get to put on her mom's backside. [name] stated that she is still waiting to hear back from the home health agency. [name] stated that she needs a nurse to help with her mom. [name] requested a call back before noon because she has to go to work.

## 2020-08-31 NOTE — Telephone Encounter (Signed)
Spoke with Paula Luna.   She is wanting to clarify what cream she was suppose to use on her mom.  She bought hydrocortisone but hasn't used it yet because she wanted to make sure that is the right cream.  She is also wanting to know where the cream is okay to use.  (Did not see any mention on cream in office note).  Paula Luna states she feels [name] has some skin break down near her rectum, where she finds a little blood when changing her mom,  but wasn't sure this would be safe to use in that area.  Paula Luna is still waiting to hear from [facility].    Please advise.

## 2020-08-31 NOTE — Telephone Encounter (Signed)
Daughter called again in regards to the message she left. Please advise.

## 2020-08-31 NOTE — Telephone Encounter (Signed)
[name] notified as instructed by telephone.

## 2020-09-01 NOTE — Telephone Encounter (Signed)
Spoke with pharmacy at [facility] in [place].  They checked and stated they do have colloid dressing available in the first aid aisle.  [name] notified of this via telephone.

## 2020-09-01 NOTE — Telephone Encounter (Signed)
Patient's daughter called in stating she is not able to find dressing at the pharmacies and is asking to speak with [name] before 12.

## 2020-09-01 NOTE — Telephone Encounter (Signed)
Would [facility] call [facility] and confirm that they do have colloid / hydrocolloid dressings?  You will need to ask the pharmacy staff - maybe even the pharmacist.  Very basic and I cannot believe that they would not have them.  Once confirmed, tell patient's daughter.

## 2020-09-02 DIAGNOSIS — M25561 Pain in right knee: Secondary | ICD-10-CM | POA: Diagnosis not present

## 2020-09-02 NOTE — Telephone Encounter (Signed)
Sent message to [place] with [facility] to check on how this needs to be ordered. Awaiting reply

## 2020-09-02 NOTE — Telephone Encounter (Signed)
The patient's daughter has requested an order for a home health aid for the patient.   I am going to need some guidance on how to facilitate this.

## 2020-09-02 NOTE — Addendum Note (Signed)
Addended by: Consuella Lose on: 09/02/2020 11:18 AM   Modules accepted: Orders

## 2020-09-02 NOTE — Telephone Encounter (Signed)
Dr Patsy Lager. I spoke with Jerilynn Som at [facility]. I re ordered Home Health with Nursing, PT and [facility] aide and pended the order. I re documented all the information you had in the first [facility] order. Please review and sign. Thank you

## 2020-09-02 NOTE — Addendum Note (Signed)
Addended by: Hannah Beat on: 09/02/2020 02:33 PM   Modules accepted: Orders

## 2020-09-03 ENCOUNTER — Telehealth: Payer: Self-pay

## 2020-09-03 NOTE — Telephone Encounter (Signed)
[name] notified by telephone to use the dressing just on the spine are.   Do not put on rectal area per Dr. Patsy Lager.  [name] states understanding.

## 2020-09-03 NOTE — Telephone Encounter (Signed)
Paula Luna (DPR signed) request cb because she is not sure where to put the hydrocolloid gel adhesive pad; Paula Luna wants to verify if goes at rectal area at end of buttock or area at upper thigh. [name] said outside the rectum is reddened area with small cut at rectum and pt has a lesion at upper thigh. Pt request cb from [facility].

## 2020-09-03 NOTE — Telephone Encounter (Signed)
error 

## 2020-09-08 DIAGNOSIS — I4821 Permanent atrial fibrillation: Secondary | ICD-10-CM | POA: Diagnosis not present

## 2020-09-08 DIAGNOSIS — I10 Essential (primary) hypertension: Secondary | ICD-10-CM | POA: Diagnosis not present

## 2020-09-08 DIAGNOSIS — H3530 Unspecified macular degeneration: Secondary | ICD-10-CM | POA: Diagnosis not present

## 2020-09-08 DIAGNOSIS — Z87891 Personal history of nicotine dependence: Secondary | ICD-10-CM | POA: Diagnosis not present

## 2020-09-08 DIAGNOSIS — S82001D Unspecified fracture of right patella, subsequent encounter for closed fracture with routine healing: Secondary | ICD-10-CM | POA: Diagnosis not present

## 2020-09-08 DIAGNOSIS — M810 Age-related osteoporosis without current pathological fracture: Secondary | ICD-10-CM | POA: Diagnosis not present

## 2020-09-08 DIAGNOSIS — Z9181 History of falling: Secondary | ICD-10-CM | POA: Diagnosis not present

## 2020-09-08 DIAGNOSIS — Z7901 Long term (current) use of anticoagulants: Secondary | ICD-10-CM | POA: Diagnosis not present

## 2020-09-08 DIAGNOSIS — L8941 Pressure ulcer of contiguous site of back, buttock and hip, stage 1: Secondary | ICD-10-CM | POA: Diagnosis not present

## 2020-09-08 DIAGNOSIS — Z7951 Long term (current) use of inhaled steroids: Secondary | ICD-10-CM | POA: Diagnosis not present

## 2020-09-15 ENCOUNTER — Telehealth: Payer: Self-pay | Admitting: Family Medicine

## 2020-09-15 NOTE — Telephone Encounter (Signed)
Called and LM with Lod[facility] - requesting urgent start date for [facility] PT, Nursing and [facility] Aid Referral changed to Urgent  FYI Dr Patsy Lager

## 2020-09-15 NOTE — Telephone Encounter (Signed)
[name] (DPR signed) left v/m that [name] spoke with [facility] and was advised by [facility] that the referral done was a standard referral and if urgent need needs expedited referral sent to [facility]. [name] left in v/m that pt fx knee cap and 4 wks have gone by and [name] is afraid pt will get hurt again. [name] request cb ASAP.

## 2020-09-15 NOTE — Telephone Encounter (Signed)
Called and spoke with Paula Luna- she is aware that I have reached out to [facility] LM on voicemail as well as sent a [employer].  She is aware that hopefully she will hear from him today if not then we will work on this tomorrow. Paula Luna is very quick and I feel confident that he will contact the patient today or latest tomorrow.

## 2020-09-15 NOTE — Telephone Encounter (Signed)
Spoke with Paula Luna Start date for services is 09/16/20 - they are going to call the family to set this up Services include - [facility] PT, Nursing and [facility] Aid  FYI  Nothing further needed.

## 2020-09-15 NOTE — Telephone Encounter (Signed)
Pt daughter stated that her mom fell 08/21/20 and broke her kneecap and had to wait 4 weeks to get the PT and nurse and she hasnt heard anything  , she cant get wheelchair and someone gave her one from her church and she is waiting for help with her mom due to she cant wash her, she called [facility] and they told her that Dr. Patsy Lager Needed to make it expedite the referral for help due to she is doing it on her own.

## 2020-09-15 NOTE — Telephone Encounter (Signed)
Do you have any idea how to do this?  I asked for them to assess within a few days.  I am not sure how to further expedite this, but if there is anything that I can do, then I am happy to do it.

## 2020-09-16 ENCOUNTER — Telehealth: Payer: Self-pay | Admitting: *Deleted

## 2020-09-16 ENCOUNTER — Telehealth: Payer: Self-pay | Admitting: Family Medicine

## 2020-09-16 NOTE — Telephone Encounter (Signed)
Patients daughter is requesting a call back before 10 am. EM

## 2020-09-16 NOTE — Telephone Encounter (Signed)
"  My mother fell and broke her knee cap.  We haven't been able to bring her in due this condition.  Does Dr. Al Corpus do house calls?  My mom's toenails are atrocious."  He does not do house calls.  "I figured as much.  Well can you let him know what happened to her?"  Yes, I will let you know.

## 2020-09-17 NOTE — Telephone Encounter (Signed)
Spoke with [name].  She states she left Ashtyn a message yesterday but didn't hear back.  They are still waiting on insurance authorization for [facility] nursing and PT.  She said the authorization is pending and was sent on 09/09/2020.  She was told it can take 7 to 14 days but she really needs help.  She said she was told the doctor sent a regular referral and not an expedited.  She states the nurse and physical therapist has been out once to do an assessment but now they are waiting on insurance approval.    She also said they went to see Dr. Sudie Grumbling yesterday but while they were in the parking lot, they received a call that the appointment had been cancelled because Dr. Sudie Grumbling was sick.  Now they can't get back in to see the orthopedist until 10/01/2020.  Will forward to Ashtyn to see if she can call [name] with any recommendations in regards to expediting referral.  If you call [name] after 10:00 am please call her cell phone.

## 2020-09-22 DIAGNOSIS — Z87891 Personal history of nicotine dependence: Secondary | ICD-10-CM | POA: Diagnosis not present

## 2020-09-22 DIAGNOSIS — M810 Age-related osteoporosis without current pathological fracture: Secondary | ICD-10-CM | POA: Diagnosis not present

## 2020-09-22 DIAGNOSIS — Z7951 Long term (current) use of inhaled steroids: Secondary | ICD-10-CM | POA: Diagnosis not present

## 2020-09-22 DIAGNOSIS — Z7901 Long term (current) use of anticoagulants: Secondary | ICD-10-CM | POA: Diagnosis not present

## 2020-09-22 DIAGNOSIS — S82001D Unspecified fracture of right patella, subsequent encounter for closed fracture with routine healing: Secondary | ICD-10-CM | POA: Diagnosis not present

## 2020-09-22 DIAGNOSIS — Z9181 History of falling: Secondary | ICD-10-CM | POA: Diagnosis not present

## 2020-09-22 DIAGNOSIS — H3530 Unspecified macular degeneration: Secondary | ICD-10-CM | POA: Diagnosis not present

## 2020-09-22 DIAGNOSIS — I4821 Permanent atrial fibrillation: Secondary | ICD-10-CM | POA: Diagnosis not present

## 2020-09-22 DIAGNOSIS — L8941 Pressure ulcer of contiguous site of back, buttock and hip, stage 1: Secondary | ICD-10-CM | POA: Diagnosis not present

## 2020-09-22 DIAGNOSIS — I10 Essential (primary) hypertension: Secondary | ICD-10-CM | POA: Diagnosis not present

## 2020-09-25 NOTE — Telephone Encounter (Signed)
Pt seen - start of services was 09/08/2020

## 2020-09-28 ENCOUNTER — Ambulatory Visit: Payer: PPO | Admitting: Podiatry

## 2020-09-30 ENCOUNTER — Ambulatory Visit: Payer: PPO | Admitting: Podiatry

## 2020-09-30 DIAGNOSIS — H3530 Unspecified macular degeneration: Secondary | ICD-10-CM | POA: Diagnosis not present

## 2020-09-30 DIAGNOSIS — I10 Essential (primary) hypertension: Secondary | ICD-10-CM | POA: Diagnosis not present

## 2020-09-30 DIAGNOSIS — I4821 Permanent atrial fibrillation: Secondary | ICD-10-CM | POA: Diagnosis not present

## 2020-09-30 DIAGNOSIS — S82001D Unspecified fracture of right patella, subsequent encounter for closed fracture with routine healing: Secondary | ICD-10-CM | POA: Diagnosis not present

## 2020-09-30 DIAGNOSIS — Z9181 History of falling: Secondary | ICD-10-CM | POA: Diagnosis not present

## 2020-09-30 DIAGNOSIS — Z87891 Personal history of nicotine dependence: Secondary | ICD-10-CM | POA: Diagnosis not present

## 2020-09-30 DIAGNOSIS — M810 Age-related osteoporosis without current pathological fracture: Secondary | ICD-10-CM | POA: Diagnosis not present

## 2020-09-30 DIAGNOSIS — L8941 Pressure ulcer of contiguous site of back, buttock and hip, stage 1: Secondary | ICD-10-CM | POA: Diagnosis not present

## 2020-09-30 DIAGNOSIS — Z7901 Long term (current) use of anticoagulants: Secondary | ICD-10-CM | POA: Diagnosis not present

## 2020-09-30 DIAGNOSIS — Z7951 Long term (current) use of inhaled steroids: Secondary | ICD-10-CM | POA: Diagnosis not present

## 2020-10-01 ENCOUNTER — Inpatient Hospital Stay
Admission: EM | Admit: 2020-10-01 | Discharge: 2020-10-09 | DRG: 389 | Disposition: A | Discharge status: Skilled Nursing Facility | Payer: PPO | Attending: Internal Medicine | Admitting: Internal Medicine

## 2020-10-01 ENCOUNTER — Other Ambulatory Visit: Payer: Self-pay

## 2020-10-01 ENCOUNTER — Inpatient Hospital Stay: Payer: PPO

## 2020-10-01 ENCOUNTER — Emergency Department: Payer: PPO

## 2020-10-01 ENCOUNTER — Encounter: Payer: Self-pay | Admitting: Emergency Medicine

## 2020-10-01 ENCOUNTER — Telehealth: Payer: Self-pay

## 2020-10-01 ENCOUNTER — Telehealth: Payer: Self-pay | Admitting: Internal Medicine

## 2020-10-01 DIAGNOSIS — Z79899 Other long term (current) drug therapy: Secondary | ICD-10-CM

## 2020-10-01 DIAGNOSIS — S82001S Unspecified fracture of right patella, sequela: Secondary | ICD-10-CM

## 2020-10-01 DIAGNOSIS — Z6824 Body mass index (BMI) 24.0-24.9, adult: Secondary | ICD-10-CM | POA: Diagnosis not present

## 2020-10-01 DIAGNOSIS — Z888 Allergy status to other drugs, medicaments and biological substances status: Secondary | ICD-10-CM

## 2020-10-01 DIAGNOSIS — K56609 Unspecified intestinal obstruction, unspecified as to partial versus complete obstruction: Secondary | ICD-10-CM | POA: Diagnosis present

## 2020-10-01 DIAGNOSIS — R109 Unspecified abdominal pain: Secondary | ICD-10-CM | POA: Diagnosis not present

## 2020-10-01 DIAGNOSIS — Z20822 Contact with and (suspected) exposure to covid-19: Secondary | ICD-10-CM | POA: Diagnosis not present

## 2020-10-01 DIAGNOSIS — M810 Age-related osteoporosis without current pathological fracture: Secondary | ICD-10-CM | POA: Diagnosis present

## 2020-10-01 DIAGNOSIS — R918 Other nonspecific abnormal finding of lung field: Secondary | ICD-10-CM | POA: Diagnosis not present

## 2020-10-01 DIAGNOSIS — R5381 Other malaise: Secondary | ICD-10-CM | POA: Diagnosis not present

## 2020-10-01 DIAGNOSIS — I10 Essential (primary) hypertension: Secondary | ICD-10-CM | POA: Diagnosis not present

## 2020-10-01 DIAGNOSIS — R1111 Vomiting without nausea: Secondary | ICD-10-CM | POA: Diagnosis not present

## 2020-10-01 DIAGNOSIS — H3530 Unspecified macular degeneration: Secondary | ICD-10-CM | POA: Diagnosis not present

## 2020-10-01 DIAGNOSIS — E876 Hypokalemia: Secondary | ICD-10-CM | POA: Diagnosis present

## 2020-10-01 DIAGNOSIS — Z95828 Presence of other vascular implants and grafts: Secondary | ICD-10-CM

## 2020-10-01 DIAGNOSIS — F0390 Unspecified dementia without behavioral disturbance: Secondary | ICD-10-CM | POA: Diagnosis present

## 2020-10-01 DIAGNOSIS — K6389 Other specified diseases of intestine: Secondary | ICD-10-CM | POA: Diagnosis not present

## 2020-10-01 DIAGNOSIS — Z452 Encounter for adjustment and management of vascular access device: Secondary | ICD-10-CM | POA: Diagnosis not present

## 2020-10-01 DIAGNOSIS — Z833 Family history of diabetes mellitus: Secondary | ICD-10-CM | POA: Diagnosis not present

## 2020-10-01 DIAGNOSIS — I959 Hypotension, unspecified: Secondary | ICD-10-CM | POA: Diagnosis not present

## 2020-10-01 DIAGNOSIS — R188 Other ascites: Secondary | ICD-10-CM | POA: Diagnosis not present

## 2020-10-01 DIAGNOSIS — I4891 Unspecified atrial fibrillation: Secondary | ICD-10-CM | POA: Diagnosis present

## 2020-10-01 DIAGNOSIS — Z808 Family history of malignant neoplasm of other organs or systems: Secondary | ICD-10-CM | POA: Diagnosis not present

## 2020-10-01 DIAGNOSIS — Z7901 Long term (current) use of anticoagulants: Secondary | ICD-10-CM | POA: Diagnosis not present

## 2020-10-01 DIAGNOSIS — Z91040 Latex allergy status: Secondary | ICD-10-CM

## 2020-10-01 DIAGNOSIS — I4821 Permanent atrial fibrillation: Secondary | ICD-10-CM | POA: Diagnosis present

## 2020-10-01 DIAGNOSIS — E440 Moderate protein-calorie malnutrition: Secondary | ICD-10-CM | POA: Diagnosis not present

## 2020-10-01 DIAGNOSIS — R279 Unspecified lack of coordination: Secondary | ICD-10-CM | POA: Diagnosis not present

## 2020-10-01 DIAGNOSIS — R488 Other symbolic dysfunctions: Secondary | ICD-10-CM | POA: Diagnosis not present

## 2020-10-01 DIAGNOSIS — R112 Nausea with vomiting, unspecified: Secondary | ICD-10-CM

## 2020-10-01 DIAGNOSIS — Z887 Allergy status to serum and vaccine status: Secondary | ICD-10-CM | POA: Diagnosis not present

## 2020-10-01 DIAGNOSIS — Z66 Do not resuscitate: Secondary | ICD-10-CM | POA: Diagnosis not present

## 2020-10-01 DIAGNOSIS — S82009A Unspecified fracture of unspecified patella, initial encounter for closed fracture: Secondary | ICD-10-CM

## 2020-10-01 DIAGNOSIS — Z87891 Personal history of nicotine dependence: Secondary | ICD-10-CM | POA: Diagnosis not present

## 2020-10-01 DIAGNOSIS — K567 Ileus, unspecified: Secondary | ICD-10-CM

## 2020-10-01 DIAGNOSIS — Z4682 Encounter for fitting and adjustment of non-vascular catheter: Secondary | ICD-10-CM | POA: Diagnosis not present

## 2020-10-01 DIAGNOSIS — G9340 Encephalopathy, unspecified: Secondary | ICD-10-CM | POA: Diagnosis not present

## 2020-10-01 DIAGNOSIS — K56699 Other intestinal obstruction unspecified as to partial versus complete obstruction: Secondary | ICD-10-CM | POA: Diagnosis not present

## 2020-10-01 DIAGNOSIS — R110 Nausea: Secondary | ICD-10-CM | POA: Diagnosis not present

## 2020-10-01 DIAGNOSIS — G9341 Metabolic encephalopathy: Secondary | ICD-10-CM | POA: Diagnosis not present

## 2020-10-01 DIAGNOSIS — S82001A Unspecified fracture of right patella, initial encounter for closed fracture: Secondary | ICD-10-CM | POA: Diagnosis not present

## 2020-10-01 DIAGNOSIS — I700 Atherosclerosis of aorta: Secondary | ICD-10-CM | POA: Diagnosis not present

## 2020-10-01 DIAGNOSIS — R031 Nonspecific low blood-pressure reading: Secondary | ICD-10-CM | POA: Diagnosis not present

## 2020-10-01 DIAGNOSIS — G911 Obstructive hydrocephalus: Secondary | ICD-10-CM | POA: Diagnosis not present

## 2020-10-01 DIAGNOSIS — M6281 Muscle weakness (generalized): Secondary | ICD-10-CM | POA: Diagnosis not present

## 2020-10-01 LAB — COMPREHENSIVE METABOLIC PANEL
ALT: 12 U/L (ref 0–44)
AST: 19 U/L (ref 15–41)
Albumin: 3.5 g/dL (ref 3.5–5.0)
Alkaline Phosphatase: 63 U/L (ref 38–126)
Anion gap: 15 (ref 5–15)
BUN: 19 mg/dL (ref 8–23)
CO2: 27 mmol/L (ref 22–32)
Calcium: 9.5 mg/dL (ref 8.9–10.3)
Chloride: 97 mmol/L — ABNORMAL LOW (ref 98–111)
Creatinine, Ser: 0.83 mg/dL (ref 0.44–1.00)
GFR, Estimated: >60 mL/min (ref >60)
Glucose, Bld: 131 mg/dL — ABNORMAL HIGH (ref 70–99)
Potassium: 3.3 mmol/L — ABNORMAL LOW (ref 3.5–5.1)
Sodium: 139 mmol/L (ref 135–145)
Total Bilirubin: 1 mg/dL (ref 0.3–1.2)
Total Protein: 6.8 g/dL (ref 6.5–8.1)

## 2020-10-01 LAB — CBC
HCT: 39.3 % (ref 36.0–46.0)
Hemoglobin: 12.6 g/dL (ref 12.0–15.0)
MCH: 25.3 pg — ABNORMAL LOW (ref 26.0–34.0)
MCHC: 32.1 g/dL (ref 30.0–36.0)
MCV: 78.9 fL — ABNORMAL LOW (ref 80.0–100.0)
Platelets: 232 10*3/uL (ref 150–400)
RBC: 4.98 MIL/uL (ref 3.87–5.11)
RDW: 15 % (ref 11.5–15.5)
WBC: 7.8 10*3/uL (ref 4.0–10.5)
nRBC: 0 % (ref 0.0–0.2)

## 2020-10-01 LAB — URINALYSIS, COMPLETE (UACMP) WITH MICROSCOPIC
Bilirubin Urine: NEGATIVE
Glucose, UA: NEGATIVE mg/dL
Ketones, ur: 20 mg/dL — AB
Leukocytes,Ua: NEGATIVE
Nitrite: NEGATIVE
Protein, ur: 30 mg/dL — AB
Specific Gravity, Urine: 1.02 (ref 1.005–1.030)
pH: 5 (ref 5.0–8.0)

## 2020-10-01 LAB — LACTIC ACID, PLASMA: Lactic Acid, Venous: 1.3 mmol/L (ref 0.5–1.9)

## 2020-10-01 LAB — SARS CORONAVIRUS 2 BY RT PCR (HOSPITAL ORDER, PERFORMED IN ~~LOC~~ HOSPITAL LAB): SARS Coronavirus 2: NEGATIVE

## 2020-10-01 LAB — LIPASE, BLOOD: Lipase: 49 U/L (ref 11–51)

## 2020-10-01 MED ORDER — IOHEXOL 300 MG/ML  SOLN
75.0000 mL | Freq: Once | INTRAMUSCULAR | Status: AC | PRN
  Administered 2020-10-01: 75 mL via INTRAVENOUS

## 2020-10-01 MED ORDER — MORPHINE SULFATE (PF) 2 MG/ML IV SOLN
2.0000 mg | INTRAVENOUS | Status: DC | PRN
  Administered 2020-10-01: 2 mg via INTRAVENOUS
  Filled 2020-10-01 (×2): qty 1

## 2020-10-01 MED ORDER — PANTOPRAZOLE SODIUM 40 MG IV SOLR
40.0000 mg | INTRAVENOUS | Status: DC
  Administered 2020-10-01 – 2020-10-07 (×7): 40 mg via INTRAVENOUS
  Filled 2020-10-01 (×8): qty 40

## 2020-10-01 MED ORDER — ONDANSETRON HCL 4 MG/2ML IJ SOLN
4.0000 mg | Freq: Once | INTRAMUSCULAR | Status: AC
  Administered 2020-10-01: 4 mg via INTRAVENOUS
  Filled 2020-10-01: qty 2

## 2020-10-01 MED ORDER — LACTATED RINGERS IV BOLUS
1000.0000 mL | Freq: Once | INTRAVENOUS | Status: AC
  Administered 2020-10-01: 1000 mL via INTRAVENOUS

## 2020-10-01 MED ORDER — HYDRALAZINE HCL 20 MG/ML IJ SOLN
10.0000 mg | Freq: Four times a day (QID) | INTRAMUSCULAR | Status: DC | PRN
  Administered 2020-10-03: 10 mg via INTRAVENOUS
  Filled 2020-10-01: qty 1

## 2020-10-01 MED ORDER — ONDANSETRON HCL 4 MG/2ML IJ SOLN
4.0000 mg | Freq: Four times a day (QID) | INTRAMUSCULAR | Status: DC | PRN
  Administered 2020-10-01: 4 mg via INTRAVENOUS
  Filled 2020-10-01 (×2): qty 2

## 2020-10-01 MED ORDER — FLUTICASONE PROPIONATE 50 MCG/ACT NA SUSP
1.0000 | Freq: Every day | NASAL | Status: DC
  Administered 2020-10-02 – 2020-10-09 (×5): 1 via NASAL
  Filled 2020-10-01 (×2): qty 16

## 2020-10-01 MED ORDER — METOPROLOL TARTRATE 5 MG/5ML IV SOLN: 5.0000 mg | Freq: Four times a day (QID) | INTRAVENOUS | Status: DC

## 2020-10-01 MED ORDER — ONDANSETRON HCL 4 MG PO TABS: 4.0000 mg | ORAL_TABLET | Freq: Four times a day (QID) | ORAL | Status: DC | PRN

## 2020-10-01 MED ORDER — POTASSIUM CHLORIDE IN NACL 40-0.9 MEQ/L-% IV SOLN
INTRAVENOUS | Status: AC
  Filled 2020-10-01 (×4): qty 1000

## 2020-10-01 MED ORDER — METOPROLOL TARTRATE 5 MG/5ML IV SOLN
5.0000 mg | Freq: Three times a day (TID) | INTRAVENOUS | Status: DC
  Administered 2020-10-01 – 2020-10-05 (×12): 5 mg via INTRAVENOUS
  Filled 2020-10-01 (×12): qty 5

## 2020-10-01 MED ORDER — SODIUM CHLORIDE 0.9 % IV SOLN: INTRAVENOUS | Status: DC

## 2020-10-01 NOTE — Telephone Encounter (Signed)
I spoke with [name] and pt is in ambulance now on her way to [facility] ED.

## 2020-10-01 NOTE — Consult Note (Signed)
Date of Consultation:  10/01/2020  Requesting Physician:  Lucile Shutters, MD  Reason for Consultation:  Small bowel obstruction  History of Present Illness: Paula Luna is a 87 y.o. female s/p prior exploratory laparotomy and small bowel resection for an incarcerated ventral hernia in 2018 with Dr. Everlene Farrier.  She presents today to the ER with abdominal pain, nausea, and vomiting.  The abdominal pain is in the central abdomen and does not radiate.  The exact history is a bit unclear, but overall she reports that she's had these symptoms for about 3 days.  She reports minimal flatus and reports no bowel movements for about 5 days.  Denies any fevers, chills, chest pain, shortness of breath.    In the ER, her workup showed some hypokalemia to 3.3, normal creatinine of 0.83, normal WBC of 7.8, and normal lactic acid of 1.3.  She had a CT scan which showed dilated loops of small bowel concerning for small bowel obstruction.  Past Medical History: Past Medical History:  Diagnosis Date  . Hypertension 12/25/2013  . Ischemic bowel disease (HCC)    a. 03/2017 abd pain-->internal herina and ischemic jejunum s/p ex lap and small bowel resection.  . Macular degeneration of left eye 01/06/2015  . Osteoporosis   . Permanent atrial fibrillation (HCC)    a. First noted 03/2017;  b. 07/2017 Echo: EF 60-65%, no rwma, mild MR, mildly to mod dil LA; c. 07/2017 48hr Holter: persistent Afib, avg HR of 66 (range 39-140 bpm);  d. CHA2DS2VASc = 4-->Xarelto initiated 07/2017.     Past Surgical History: Past Surgical History:  Procedure Laterality Date  . CLOSED REDUCTION PROXIMAL HUMERUS FRACTURE Left 2017  . LAPAROSCOPIC SMALL BOWEL RESECTION    . LAPAROTOMY N/A 03/18/2017   Procedure: EXPLORATORY LAPAROTOMY, SMALL BOWEL RESECTION;  Surgeon: Leafy Ro, MD;  Location: [facility] ORS;  Service: General;  Laterality: N/A;  . TOTAL ABDOMINAL HYSTERECTOMY      Home Medications: Prior to Admission medications    Medication Sig Start Date End Date Taking? Authorizing Provider  amLODipine (NORVASC) 5 MG tablet Take 1 tablet by mouth once daily Patient taking differently: Take 5 mg by mouth daily. 07/13/20  Yes End, Cristal Deer, MD  Cholecalciferol (VITAMIN D) 50 MCG (2000 UT) CAPS Take 1 capsule by mouth daily.   Yes [provider]  fluticasone (FLONASE) 50 MCG/ACT nasal spray Use 2 spray(s) in each nostril once daily 08/04/20  Yes Copland, Karleen Hampshire, MD  lisinopril (ZESTRIL) 40 MG tablet Take 1 tablet by mouth once daily Patient taking differently: Take 40 mg by mouth daily. 03/03/20  Yes End, Cristal Deer, MD  metoprolol succinate (TOPROL-XL) 25 MG 24 hr tablet Take 1 tablet by mouth once daily Patient taking differently: Take 25 mg by mouth daily. 05/11/20  Yes Copland, Karleen Hampshire, MD  Multiple Vitamins-Minerals (ICAPS) CAPS Take 1 capsule by mouth 2 (two) times daily.    Yes [provider]  Rivaroxaban (XARELTO) 15 MG TABS tablet TAKE 1 TABLET BY MOUTH ONCE DAILY WITH SUPPER Patient taking differently: Take 15 mg by mouth daily with supper. TAKE 1 TABLET BY MOUTH ONCE DAILY WITH SUPPER 06/01/20  Yes End, Cristal Deer, MD  docusate sodium (COLACE) 100 MG capsule Take 200 mg by mouth daily.    [provider]  NON FORMULARY Vitamin D 50 mcg, take one daily by mouth Patient not taking: No sig reported    [provider]  traMADol (ULTRAM) 50 MG tablet Take 1 tablet (50 mg total)  by mouth every 8 (eight) hours as needed for moderate pain. 08/27/20   Hannah Beat, MD    Allergies: Allergies  Allergen Reactions  . Latex Rash  . Tetanus Toxoid Anaphylaxis    REACTION: anaphylaxis  . Alendronate Sodium     REACTION: passed out    Social History:  reports that she has quit smoking. She has never used smokeless tobacco. She reports that she does not drink alcohol and does not use drugs.   Family History: Family History  Problem Relation Age of Onset  . Diabetes  Mother   . Cancer Father        jaw    Review of Systems: Review of Systems  Constitutional: Negative for chills and fever.  HENT: Negative for hearing loss.   Respiratory: Negative for shortness of breath.   Cardiovascular: Negative for chest pain.  Gastrointestinal: Positive for abdominal pain, nausea and vomiting. Negative for constipation and diarrhea.  Genitourinary: Negative for dysuria.  Musculoskeletal: Negative for myalgias.  Skin: Negative for rash.  Neurological: Negative for dizziness.  Psychiatric/Behavioral: Negative for depression.    Physical Exam BP (!) 136/57 (BP Location: Left Arm)   Pulse 68   Temp 97.9 F (36.6 C) (Oral)   Resp 16   Ht 4\' 11"  (1.499 m)   Wt 54.4 kg   SpO2 99%   BMI 24.24 kg/m  CONSTITUTIONAL: No acute distress HEENT:  Normocephalic, atraumatic, extraocular motion intact. NECK: Trachea is midline, and there is no jugular venous distension. RESPIRATORY:  Normal respiratory effort without pathologic use of accessory muscles. CARDIOVASCULAR: Regular rhythm and rate. GI: The patient did not allow me to examine her abdomen.  Does not appear significantly distended, but she has a hospital gown and blanket covering her.  NG tube is to suction, with bilious fluid in canister. MUSCULOSKELETAL:  Normal muscle strength and tone in all four extremities.  No peripheral edema or cyanosis. SKIN: Skin turgor is normal. There are no pathologic skin lesions.  NEUROLOGIC:  Motor and sensation is grossly normal.  Cranial nerves are grossly intact. PSYCH:  Alert and oriented to person, place and time. Affect is normal.  Laboratory Analysis: Results for orders placed or performed during the hospital encounter of 10/01/20 (from the past 24 hour(s))  Lipase, blood     Status: None   Collection Time: 10/01/20  9:11 AM  Result Value Ref Range   Lipase 49 11 - 51 U/L  Comprehensive metabolic panel     Status: Abnormal   Collection Time: 10/01/20  9:11 AM   Result Value Ref Range   Sodium 139 135 - 145 mmol/L   Potassium 3.3 (L) 3.5 - 5.1 mmol/L   Chloride 97 (L) 98 - 111 mmol/L   CO2 27 22 - 32 mmol/L   Glucose, Bld 131 (H) 70 - 99 mg/dL   BUN 19 8 - 23 mg/dL   Creatinine, Ser 10/03/20 0.44 - 1.00 mg/dL   Calcium 9.5 8.9 - 0.62 mg/dL   Total Protein 6.8 6.5 - 8.1 g/dL   Albumin 3.5 3.5 - 5.0 g/dL   AST 19 15 - 41 U/L   ALT 12 0 - 44 U/L   Alkaline Phosphatase 63 38 - 126 U/L   Total Bilirubin 1.0 0.3 - 1.2 mg/dL   GFR, Estimated 37.6 >28 mL/min   Anion gap 15 5 - 15  CBC     Status: Abnormal   Collection Time: 10/01/20  9:11 AM  Result Value Ref Range  WBC 7.8 4.0 - 10.5 K/uL   RBC 4.98 3.87 - 5.11 MIL/uL   Hemoglobin 12.6 12.0 - 15.0 g/dL   HCT 16.139.3 09.636.0 - 04.546.0 %   MCV 78.9 (L) 80.0 - 100.0 fL   MCH 25.3 (L) 26.0 - 34.0 pg   MCHC 32.1 30.0 - 36.0 g/dL   RDW 40.915.0 81.111.5 - 91.415.5 %   Platelets 232 150 - 400 K/uL   nRBC 0.0 0.0 - 0.2 %  Urinalysis, Complete w Microscopic Urine, Clean Catch     Status: Abnormal   Collection Time: 10/01/20 12:11 PM  Result Value Ref Range   Color, Urine YELLOW (A) YELLOW   APPearance HAZY (A) CLEAR   Specific Gravity, Urine 1.020 1.005 - 1.030   pH 5.0 5.0 - 8.0   Glucose, UA NEGATIVE NEGATIVE mg/dL   Hgb urine dipstick SMALL (A) NEGATIVE   Bilirubin Urine NEGATIVE NEGATIVE   Ketones, ur 20 (A) NEGATIVE mg/dL   Protein, ur 30 (A) NEGATIVE mg/dL   Nitrite NEGATIVE NEGATIVE   Leukocytes,Ua NEGATIVE NEGATIVE   RBC / HPF 0-5 0 - 5 RBC/hpf   WBC, UA 6-10 0 - 5 WBC/hpf   Bacteria, UA RARE (A) NONE SEEN   Squamous Epithelial / LPF 6-10 0 - 5   Mucus PRESENT    Non Squamous Epithelial PRESENT (A) NONE SEEN  Lactic acid, plasma     Status: None   Collection Time: 10/01/20  3:13 PM  Result Value Ref Range   Lactic Acid, Venous 1.3 0.5 - 1.9 mmol/L  SARS Coronavirus 2 by RT PCR (hospital order[facility]) Nasopharyngeal Nasopharyngeal Swab     Status: None   Collection  Time: 10/01/20  3:27 PM   Specimen: Nasopharyngeal Swab  Result Value Ref Range   SARS Coronavirus 2 NEGATIVE NEGATIVE    Imaging: CT Abdomen Pelvis W Contrast  Result Date: 10/01/2020 EXAM: CT ABDOMEN AND PELVIS WITH CONTRAST TECHNIQUE: Multidetector CT imaging of the abdomen and pelvis was performed using the standard protocol following bolus administration of intravenous contrast. CONTRAST:  75mL OMNIPAQUE IOHEXOL 300 MG/ML  SOLN COMPARISON:  None. FINDINGS: Lower chest: Lung bases are clear. Hepatobiliary: No focal hepatic lesion.  No biliary duct dilatation. Pancreas: Pancreas is normal. No ductal dilatation. No pancreatic inflammation. Spleen: Normal spleen Adrenals/urinary tract: Adrenal glands normal. Bilateral renal cortical thinning. Bladder normal Stomach/Bowel: Stomach is filled with fluid. The duodenum is fluid-filled as well as the proximal small bowel. Proximal small bowel measures 3.2 cm in diameter. The distal small bowel is collapsed measuring 0.8 cm. No portal venous gas. No intraperitoneal free air. No pneumatosis intestinalis. There is an enteric enteric anastomosis in the central abdomen. Transition point appears to be in the vicinity of this anastomosis. (Image 54/2). Colon rectosigmoid colon are collapsed. Stool in the rectum. Gas the rectum Vascular/Lymphatic: Abdominal aorta is normal caliber with atherosclerotic calcification. There is no retroperitoneal or periportal lymphadenopathy. No pelvic lymphadenopathy. Reproductive: Post hysterectomy.  Adnexa unremarkable Other: No free fluid. Musculoskeletal: No aggressive osseous lesion. IMPRESSION: 1. Mechanical small bowel obstruction with fluid-filled stomach, duodenum, and proximal small bowel with collapsed distal small bowel. 2. Potential site of obstruction at the enteric enteric anastomosis in the mid bowel. 3. No portal venous gas or intraperitoneal free air. No pneumatosis intestinalis. Findings conveyed toEVAN BRADLER on  10/01/2020  at15:20. Electronically Signed   By: Genevive BiStewart  Edmunds M.D.   On: 10/01/2020 15:27   DG Abd Portable 1 View  Result Date: 10/01/2020 CLINICAL DATA:  NG tube placed EXAM: PORTABLE ABDOMEN - 1 VIEW COMPARISON:  None. FINDINGS: NG tube extends into the stomach.  Side port at the GE junction. IMPRESSION: NG tube in stomach. Electronically Signed   By: Genevive Bi M.D.   On: 10/01/2020 18:26    Assessment and Plan: This is a 87 y.o. female with SBO  --Discussed with the patient the findings on her CT scan and the plan for conservative measures.  Discussed with her the role for NG tube to decompress the bowels.  Keep the patient NPO with IV fluid hydration, while awaiting return of bowel function.  She is aware that it could be that she needs surgery again, but the majority of patients with SBO resolve with conservative measures.  Dr. Everlene Farrier is aware of the admission and consult and will follow the patient tomorrow.  Face-to-face time spent with the patient and care providers was 55 minutes, with more than 50% of the time spent counseling, educating, and coordinating care of the patient.     Howie Ill, MD [facility]:  [phone]

## 2020-10-01 NOTE — ED Notes (Signed)
Pt family contact updated, daughter would like updated at [phone].

## 2020-10-01 NOTE — Telephone Encounter (Signed)
I will follow along.  She is on Xarelto.

## 2020-10-01 NOTE — Telephone Encounter (Signed)
[facility] Night - Client TELEPHONE ADVICE RECORD AccessNurse Patient Name: Paula Luna Gender: Female DOB: 23-Oct-1924 Age: 87 Y 11 M 25 D Return Phone Number: [phone] (Primary), [phone] (Secondary) Address[place] [address] Client [facility] Night - Client Client Site [facility] [place] - Night Physician Hannah Beat - MD Contact Type Call Who Is Calling Patient / Member / Family / Caregiver Call Type Triage / Clinical Caller Name Roger Shelter Relationship To Patient Daughter Return Phone Number [phone] (Secondary) Chief Complaint Abdominal Pain Reason for Call Symptomatic / Request for Health Information Initial Comment Caller states that her mother fell and broke her knee recently. She has a pain in her stomach, not eating much and she is vomiting dark brown. Translation No Nurse Assessment Nurse: Viviann Spare, RN, Tia Date/Time (Eastern Time): 10/01/2020 7:18:50 AM Confirm and document reason for call. If symptomatic, describe symptoms. ---Caller states that her mother fell and broke her knee recently on dec 10th. She has a pain in her stomach pain next to her navel, not eating much with quite a bit of constipation and she is vomiting dark brown started yesterday. Does the patient have any new or worsening symptoms? ---Yes Will a triage be completed? ---Yes Related visit to physician within the last 2 weeks? ---No Does the PT have any chronic conditions? (i.e. diabetes, asthma, this includes High risk factors for pregnancy, etc.) ---Yes List chronic conditions. ---high blood pressure Is this a behavioral health or substance abuse call? ---No Guidelines Guideline Title Affirmed Question Affirmed Notes Nurse Date/Time (Eastern Time) Vomiting [1] Vomiting AND [2] contains red blood or black ("coffee ground") material (Exception: few red streaks in vomit that only  happened once) Viviann Spare, Charity fundraiser, Tia 10/01/2020 7:21:35 AM Disp. Time Lamount Cohen Time) Disposition Final User 10/01/2020 7:33:30 AM 911 Outcome Documentation Viviann Spare, RN, Tia Reason: was not able to reach caller PLEASE NOTE: All timestamps contained within this report are represented as [place] Standard Time. CONFIDENTIALTY NOTICE: This fax transmission is intended only for the addressee. It contains information that is legally privileged, confidential or otherwise protected from use or disclosure. If you are not the intended recipient, you are strictly prohibited from reviewing, disclosing, copying using or disseminating any of this information or taking any action in reliance on or regarding this information. If you have received this fax in error, please notify us immediately by telephone so that we can arrange for its return to [place]. Phone: [phone], Toll-Free: [phone], Fax: [phone] Page: 2 of 2 Call Id: [id number] 10/01/2020 7:29:27 AM Call EMS 911 Now Yes Viviann Spare, RN, Tia Disposition Overriden: Go to ED Now Override Reason: Specify reason. (Please document in 'advice recommended' section) Caller Disagree/Comply Comply Caller Understands Yes PreDisposition InappropriateToAsk Care Advice Given Per Guideline GO TO ED NOW: * You need to be seen in the Emergency Department. * Go to the ED at ___________ Hospital. * Leave now. Drive carefully. BRING A BUCKET IN CASE OF VOMITING: * You may wish to bring a bucket, pan, or plastic bag with you in case there is more vomiting during the drive. ANOTHER ADULT SHOULD DRIVE: * It is better and safer if another adult drives instead of you. CALL EMS 911 NOW: * Immediate medical attention is needed. You need to hang up and call 911 (or an ambulance). * Triager Discretion: I'll call you back in a few minutes to be sure you were able to reach them. CARE ADVICE per Vomiting (Adult) guideline. needs  assistance helping her mother into a car so i advised  calling 911. Comments User: Criss Alvine, RN Date/Time Lamount Cohen Time): 10/01/2020 7:21:49 AM she currently has Afib as well. User: Criss Alvine, RN Date/Time Lamount Cohen Time): 10/01/2020 7:28:22 AM needs help getting her into a car so i advised calling 911 for assistance.

## 2020-10-01 NOTE — ED Notes (Signed)
Hospitalist at bedside 

## 2020-10-01 NOTE — ED Provider Notes (Signed)
[name]   ____________________________________________   Event Date/Time   First MD Initiated Contact with Patient 10/01/20 1211     (approximate)  I have reviewed the triage vital signs and the nursing notes.   HISTORY  Chief Complaint Emesis    HPI Paula Luna is a 87 y.o. female with a stated past medical history of hypertension and ischemic bowel disease status post small bowel resection as well as persistent atrial fibrillation who presents for upper abdominal pain, nausea, and vomiting that began yesterday.  Patient describes a "full feeling" in her upper abdomen that is worse with any attempted p.o. intake.  Patient has been p.o. intolerant since yesterday.  Patient states symptoms only improved after her Zofran administration.  Last bowel movement was yesterday.  Patient denies any other exacerbating or relieving factors.  Patient currently denies any vision changes, tinnitus, difficulty speaking, facial droop, sore throat, chest pain, shortness of breath, diarrhea, dysuria, or weakness/numbness/paresthesias in any extremity         Past Medical History:  Diagnosis Date  . Hypertension 12/25/2013  . Ischemic bowel disease (HCC)    a. 03/2017 abd pain-->internal herina and ischemic jejunum s/p ex lap and small bowel resection.  . Macular degeneration of left eye 01/06/2015  . Osteoporosis   . Permanent atrial fibrillation (HCC)    a. First noted 03/2017;  b. 07/2017 Echo: EF 60-65%, no rwma, mild MR, mildly to mod dil LA; c. 07/2017 48hr Holter: persistent Afib, avg HR of 66 (range 39-140 bpm);  d. CHA2DS2VASc = 4-->Xarelto initiated 07/2017.    Patient Active Problem List   Diagnosis Date Noted  . Atrial fibrillation (HCC) 04/26/2017  . 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 Macular degeneration of left eye 01/06/2015  . Essential hypertension 12/25/2013     Past Surgical History:  Procedure Laterality Date  . CLOSED REDUCTION PROXIMAL HUMERUS FRACTURE Left 2017  . LAPAROSCOPIC SMALL BOWEL RESECTION    . LAPAROTOMY N/A 03/18/2017   Procedure: EXPLORATORY LAPAROTOMY, SMALL BOWEL RESECTION;  Surgeon: Leafy Ro, MD;  Location: [facility] ORS;  Service: General;  Laterality: N/A;  . TOTAL ABDOMINAL HYSTERECTOMY      Prior to Admission medications   Medication Sig Start Date End Date Taking? Authorizing Provider  amLODipine (NORVASC) 5 MG tablet Take 1 tablet by mouth once daily 07/13/20   End, Cristal Deer, MD  Cholecalciferol (VITAMIN D) 50 MCG (2000 UT) CAPS Take 1 capsule by mouth daily.    [provider]  docusate sodium (COLACE) 100 MG capsule Take 200 mg by mouth daily.    [provider]  fluticasone Aleda Grana) 50 MCG/ACT nasal spray Use 2 spray(s) in each nostril once daily 08/04/20   Copland, Karleen Hampshire, MD  lisinopril (ZESTRIL) 40 MG tablet Take 1 tablet by mouth once daily 03/03/20   End, Cristal Deer, MD  metoprolol succinate (TOPROL-XL) 25 MG 24 hr tablet Take 1 tablet by mouth once daily 05/11/20   Copland, Spencer, MD  Multiple Vitamins-Minerals (ICAPS) CAPS Take 1 capsule by mouth 2 (two) times daily.     [provider]  NON FORMULARY Vitamin D 50 mcg, take one daily by mouth    [provider]  Rivaroxaban (XARELTO) 15 MG TABS tablet TAKE 1 TABLET BY MOUTH ONCE DAILY WITH SUPPER 06/01/20   End, Cristal Deer, MD  traMADol (ULTRAM) 50 MG tablet Take 1 tablet (50 mg total) by  mouth every 8 (eight) hours as needed for moderate pain. 08/27/20   Copland, Karleen Hampshire, MD    Allergies Latex, Tetanus toxoid, and Alendronate sodium  Family History  Problem Relation Age of Onset  . Diabetes Mother   . Cancer Father        jaw    Social History Social History   Tobacco Use  . Smoking status: Former Games developer  . Smokeless tobacco: Never Used  Vaping Use  . Vaping Use: Never used  Substance Use Topics  .  Alcohol use: No  . Drug use: No    Review of Systems Constitutional: No fever/chills Eyes: No visual changes. ENT: No sore throat. Cardiovascular: Denies chest pain. Respiratory: Denies shortness of breath. Gastrointestinal: Endorses abdominal pain, nausea, and vomiting.  No diarrhea. Genitourinary: Negative for dysuria. Musculoskeletal: Negative for acute arthralgias Skin: Negative for rash. Neurological: Negative for headaches, weakness/numbness/paresthesias in any extremity Psychiatric: Negative for suicidal ideation/homicidal ideation   ____________________________________________   PHYSICAL EXAM:  VITAL SIGNS: ED Triage Vitals  Enc Vitals Group     BP 10/01/20 0909 122/63     Pulse Rate 10/01/20 0909 64     Resp 10/01/20 0909 18     Temp 10/01/20 0909 98.9 F (37.2 C)     Temp Source 10/01/20 0909 Oral     SpO2 10/01/20 0909 98 %     Weight 10/01/20 0910 120 lb (54.4 kg)     Height 10/01/20 0910 4\' 11"  (1.499 m)     Head Circumference --      Peak Flow --      Pain Score 10/01/20 0913 4     Pain Loc --      Pain Edu? --      Excl. in GC? --    Constitutional: Alert and oriented. Well appearing and in no acute distress. Eyes: Conjunctivae are normal. PERRL. Head: Atraumatic. Nose: No congestion/rhinnorhea. Mouth/Throat: Mucous membranes are moist. Neck: No stridor Cardiovascular: Grossly normal heart sounds.  Good peripheral circulation. Respiratory: Normal respiratory effort.  No retractions. Gastrointestinal: Soft.  Mild tenderness palpation in the midepigastric region.  Distended upper abdomen Musculoskeletal: No obvious deformities Neurologic:  Normal speech and language. No gross focal neurologic deficits are appreciated. Skin:  Skin is warm and dry. No rash noted. Psychiatric: Mood and affect are normal. Speech and behavior are normal.  ____________________________________________   LABS (all labs ordered are listed, but only abnormal results are  displayed)  Labs Reviewed  COMPREHENSIVE METABOLIC PANEL - Abnormal; Notable for the following components:      Result Value   Potassium 3.3 (*)    Chloride 97 (*)    Glucose, Bld 131 (*)    All other components within normal limits  CBC - Abnormal; Notable for the following components:   MCV 78.9 (*)    MCH 25.3 (*)    All other components within normal limits  URINALYSIS, COMPLETE (UACMP) WITH MICROSCOPIC - Abnormal; Notable for the following components:   Color, Urine YELLOW (*)    APPearance HAZY (*)    Hgb urine dipstick SMALL (*)    Ketones, ur 20 (*)    Protein, ur 30 (*)    Bacteria, UA RARE (*)    Non Squamous Epithelial PRESENT (*)    All other components within normal limits  SARS CORONAVIRUS 2 BY RT PCR (HOSPITAL ORDER, PERFORMED IN  HOSPITAL LAB)  LIPASE, BLOOD  LACTIC ACID, PLASMA  LACTIC ACID, PLASMA    RADIOLOGY  ED MD interpretation: CT of the abdomen and pelvis with IV contrast shows a small bowel obstruction at the level of the small bowel anastomosis from previous surgery  Official radiology report(s): No results found.  ____________________________________________   PROCEDURES  Procedure(s) performed (including Critical Care):  .1-3 Lead EKG Interpretation Performed by: Merwyn Katos, MD Authorized by: Merwyn Katos, MD     Interpretation: normal     ECG rate:  65   ECG rate assessment: normal     Rhythm: sinus rhythm     Ectopy: none     Conduction: normal       ____________________________________________   INITIAL IMPRESSION / ASSESSMENT AND PLAN / ED COURSE  As part of my medical decision making, I reviewed the following data within the electronic MEDICAL RECORD NUMBER Nursing notes reviewed and incorporated, Labs reviewed, EKG interpreted, Old chart reviewed, Radiograph reviewed and Notes from prior ED visits reviewed and incorporated        Given History, Exam I believe patient needs labs and imaging to evaluate  for SBO vs other acute abdomen. ED Workup: CBC, BMP, LFTs, CT Abdomen/Pelvis ED Findings: CT: Small Bowel Obstruction  History, Exam, and Workup show no overt evidence of mesenteric ischemia, bowel gangrene, abscess, peritonitis. ED Interventions: Analgesia. Defer ABX at this time. Consult: General Surgery Disposition: Admit      ____________________________________________   FINAL CLINICAL IMPRESSION(S) / ED DIAGNOSES  Final diagnoses:  Intractable vomiting with nausea, unspecified vomiting type  SBO (small bowel obstruction) [facility])     ED Discharge Orders    None       Note:  This document was prepared using Dragon voice recognition software and may include unintentional dictation errors.   Merwyn Katos, MD 10/01/20 [phone]

## 2020-10-01 NOTE — ED Triage Notes (Signed)
Pt from home reports that she has been having N/V x 2 days.

## 2020-10-01 NOTE — H&P (Addendum)
History and Physical    Paula Luna KGM:[id number] DOB: 1925-01-26 DOA: 10/01/2020  PCP: Hannah Beat, MD   Patient coming from: Home  I have personally briefly reviewed patient's old medical records in [facility]  Chief Complaint: Abdominal pain                                Nausea/vomiting  HPI: Paula Luna is a 87 y.o. female with medical history significant for hypertension, history of A. 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  Patient feels like she has a "knot" in her upper abdomen.  She has not had a "good" bowel movement in days.  Oral intake has been poor due to nausea.  She rates her abdominal pain a 4 x 10 in intensity at its worst.  Pain is constant with no relieving or aggravating factors.  She had some improvement in her nausea/ emesis with Zofran. She denies having any chest pain, no shortness of breath, no cough, no fever, no chills, no urinary symptoms, no diarrhea, no palpitations, no diaphoresis, no weakness, no paresthesias, no sore throat. Labs show sodium 139, potassium 3.3, chloride 97, bicarb 27, glucose 131, BUN 19, creatinine 0.83, calcium 9.5, alkaline phosphatase 63, albumin 3.5, lipase 49, AST 19, ALT 12, white count 7.8, hemoglobin 12.6, hematocrit 39.3, MCV 78.9, RDW 15, platelet count 232 CT scan of abdomen and pelvis shows mechanical small bowel obstruction with fluid-filled stomach,duodenum, and proximal small bowel with collapsed distal small Bowel. Potential site of obstruction at the enteric enteric anastomosis in the mid bowel. No portal venous gas or intraperitoneal free air. No pneumatosis Intestinalis. COVID-19 PCR testing is pending Twelve-lead EKG is also pending at the time of this H&P   ED Course: Patient is a  87 year old Caucasian female with a history of atrial fibrillation, hypertension, ischemic bowel status post small bowel resection who presents for evaluation of abdominal pain, nausea, vomiting and constipation.  Imaging is suggestive of a mechanical small bowel obstruction with potential site of obstruction at the enteric anastomosis. Surgery has been consulted and patient will be admitted to the hospital for further evaluation  Review of Systems: As per HPI otherwise all systems reviewed and negative.   Past Medical History:  Diagnosis Date  . Hypertension 12/25/2013  . Ischemic bowel disease (HCC)    a. 03/2017 abd pain-->internal herina and ischemic jejunum s/p ex lap and small bowel resection.  . Macular degeneration of left eye 01/06/2015  . Osteoporosis   . Permanent atrial fibrillation (HCC)    a. First noted 03/2017;  b. 07/2017 Echo: EF 60-65%, no rwma, mild MR, mildly to mod dil LA; c. 07/2017 48hr Holter: persistent Afib, avg HR of 66 (range 39-140 bpm);  d. CHA2DS2VASc = 4-->Xarelto initiated 07/2017.    Past Surgical History:  Procedure Laterality Date  . CLOSED REDUCTION PROXIMAL HUMERUS FRACTURE Left 2017  . LAPAROSCOPIC SMALL BOWEL RESECTION    . LAPAROTOMY N/A 03/18/2017   Procedure: EXPLORATORY LAPAROTOMY, SMALL BOWEL RESECTION;  Surgeon: Leafy Ro, MD;  Location: [facility] ORS;  Service: General;  Laterality: N/A;  . TOTAL ABDOMINAL HYSTERECTOMY       reports that she has quit smoking. She has never used smokeless tobacco. She  reports that she does not drink alcohol and does not use drugs.  Allergies  Allergen Reactions  . Latex Rash  . Tetanus Toxoid Anaphylaxis    REACTION: anaphylaxis  . Alendronate Sodium     REACTION: passed out    Family History  Problem Relation Age of Onset  . Diabetes Mother   . Cancer Father        jaw     Prior to Admission medications   Medication Sig Start Date End Date Taking? Authorizing Provider  amLODipine (NORVASC) 5 MG  tablet Take 1 tablet by mouth once daily 07/13/20   End, Cristal Deer, MD  Cholecalciferol (VITAMIN D) 50 MCG (2000 UT) CAPS Take 1 capsule by mouth daily.    [provider]  docusate sodium (COLACE) 100 MG capsule Take 200 mg by mouth daily.    [provider]  fluticasone Aleda Grana) 50 MCG/ACT nasal spray Use 2 spray(s) in each nostril once daily 08/04/20   Copland, Karleen Hampshire, MD  lisinopril (ZESTRIL) 40 MG tablet Take 1 tablet by mouth once daily 03/03/20   End, Cristal Deer, MD  metoprolol succinate (TOPROL-XL) 25 MG 24 hr tablet Take 1 tablet by mouth once daily 05/11/20   Copland, Spencer, MD  Multiple Vitamins-Minerals (ICAPS) CAPS Take 1 capsule by mouth 2 (two) times daily.     [provider]  NON FORMULARY Vitamin D 50 mcg, take one daily by mouth    [provider]  Rivaroxaban (XARELTO) 15 MG TABS tablet TAKE 1 TABLET BY MOUTH ONCE DAILY WITH SUPPER 06/01/20   End, Cristal Deer, MD  traMADol (ULTRAM) 50 MG tablet Take 1 tablet (50 mg total) by mouth every 8 (eight) hours as needed for moderate pain. 08/27/20   Hannah Beat, MD    Physical Exam: Vitals:   10/01/20 0909 10/01/20 0910 10/01/20 1221 10/01/20 1530  BP: 122/63  136/78 (!) 176/84  Pulse: 64  67 75  Resp: 18  18 15   Temp: 98.9 F (37.2 C)  97.9 F (36.6 C)   TempSrc: Oral  Oral   SpO2: 98%  95% 96%  Weight:  54.4 kg    Height:  4\' 11"  (1.499 m)       Vitals:   10/01/20 0909 10/01/20 0910 10/01/20 1221 10/01/20 1530  BP: 122/63  136/78 (!) 176/84  Pulse: 64  67 75  Resp: 18  18 15   Temp: 98.9 F (37.2 C)  97.9 F (36.6 C)   TempSrc: Oral  Oral   SpO2: 98%  95% 96%  Weight:  54.4 kg    Height:  4\' 11"  (1.499 m)      Constitutional: NAD, alert and oriented x 3 Eyes: PERRL, lids and conjunctivae normal ENMT: Mucous membranes are moist.  Neck: normal, supple, no masses, no thyromegaly Respiratory: clear to auscultation bilaterally, no wheezing, no crackles. Normal  respiratory effort. No accessory muscle use.  Cardiovascular: Regular rate and rhythm, no murmurs / rubs / gallops. No extremity edema. 2+ pedal pulses. No carotid bruits.  Abdomen: Periumbilical tenderness, no masses palpated. Firm palpable mass in the right peri umbilical area,  No hepatosplenomegaly. Bowel sounds positive.  Musculoskeletal: no clubbing / cyanosis. No joint deformity upper and lower extremities.  Skin: no rashes, lesions, ulcers.  Neurologic: No gross focal neurologic deficit. Psychiatric: Normal mood and affect.   Labs on Admission: I have personally reviewed following labs and imaging studies  CBC: Recent Labs  Lab 10/01/20 0911  WBC 7.8  HGB 12.6  HCT  39.3  MCV 78.9*  PLT 232   Basic Metabolic Panel: Recent Labs  Lab 10/01/20 0911  NA 139  K 3.3*  CL 97*  CO2 27  GLUCOSE 131*  BUN 19  CREATININE 0.83  CALCIUM 9.5   GFR: Estimated Creatinine Clearance: 31.9 mL/min (by C-G formula based on SCr of 0.83 mg/dL). Liver Function Tests: Recent Labs  Lab 10/01/20 0911  AST 19  ALT 12  ALKPHOS 63  BILITOT 1.0  PROT 6.8  ALBUMIN 3.5   Recent Labs  Lab 10/01/20 0911  LIPASE 49   No results for input(s): AMMONIA in the last 168 hours. Coagulation Profile: No results for input(s): INR, PROTIME in the last 168 hours. Cardiac Enzymes: No results for input(s): CKTOTAL, CKMB, CKMBINDEX, TROPONINI in the last 168 hours. BNP (last 3 results) No results for input(s): PROBNP in the last 8760 hours. HbA1C: No results for input(s): HGBA1C in the last 72 hours. CBG: No results for input(s): GLUCAP in the last 168 hours. Lipid Profile: No results for input(s): CHOL, HDL, LDLCALC, TRIG, CHOLHDL, LDLDIRECT in the last 72 hours. Thyroid Function Tests: No results for input(s): TSH, T4TOTAL, FREET4, T3FREE, THYROIDAB in the last 72 hours. Anemia Panel: No results for input(s): VITAMINB12, FOLATE, FERRITIN, TIBC, IRON, RETICCTPCT in the last 72 hours. Urine  analysis:    Component Value Date/Time   COLORURINE YELLOW (A) 10/01/2020 1211   APPEARANCEUR HAZY (A) 10/01/2020 1211   LABSPEC 1.020 10/01/2020 1211   PHURINE 5.0 10/01/2020 1211   GLUCOSEU NEGATIVE 10/01/2020 1211   HGBUR SMALL (A) 10/01/2020 1211   BILIRUBINUR NEGATIVE 10/01/2020 1211   KETONESUR 20 (A) 10/01/2020 1211   PROTEINUR 30 (A) 10/01/2020 1211   NITRITE NEGATIVE 10/01/2020 1211   LEUKOCYTESUR NEGATIVE 10/01/2020 1211    Radiological Exams on Admission: CT Abdomen Pelvis W Contrast  Result Date: 10/01/2020 EXAM: CT ABDOMEN AND PELVIS WITH CONTRAST TECHNIQUE: Multidetector CT imaging of the abdomen and pelvis was performed using the standard protocol following bolus administration of intravenous contrast. CONTRAST:  16mL OMNIPAQUE IOHEXOL 300 MG/ML  SOLN COMPARISON:  None. FINDINGS: Lower chest: Lung bases are clear. Hepatobiliary: No focal hepatic lesion.  No biliary duct dilatation. Pancreas: Pancreas is normal. No ductal dilatation. No pancreatic inflammation. Spleen: Normal spleen Adrenals/urinary tract: Adrenal glands normal. Bilateral renal cortical thinning. Bladder normal Stomach/Bowel: Stomach is filled with fluid. The duodenum is fluid-filled as well as the proximal small bowel. Proximal small bowel measures 3.2 cm in diameter. The distal small bowel is collapsed measuring 0.8 cm. No portal venous gas. No intraperitoneal free air. No pneumatosis intestinalis. There is an enteric enteric anastomosis in the central abdomen. Transition point appears to be in the vicinity of this anastomosis. (Image 54/2). Colon rectosigmoid colon are collapsed. Stool in the rectum. Gas the rectum Vascular/Lymphatic: Abdominal aorta is normal caliber with atherosclerotic calcification. There is no retroperitoneal or periportal lymphadenopathy. No pelvic lymphadenopathy. Reproductive: Post hysterectomy.  Adnexa unremarkable Other: No free fluid. Musculoskeletal: No aggressive osseous lesion.  IMPRESSION: 1. Mechanical small bowel obstruction with fluid-filled stomach, duodenum, and proximal small bowel with collapsed distal small bowel. 2. Potential site of obstruction at the enteric enteric anastomosis in the mid bowel. 3. No portal venous gas or intraperitoneal free air. No pneumatosis intestinalis. Findings conveyed toEVAN BRADLER on 10/01/2020  at15:20. Electronically Signed   By: Genevive Bi M.D.   On: 10/01/2020 15:27    EKG: Independently reviewed.   Assessment/Plan Principal Problem:   SBO (small bowel obstruction) (  HCC) Active Problems:   Essential hypertension   Atrial fibrillation (HCC)     Small bowel obstruction Patient presents for evaluation of abdominal pain, nausea, vomiting and constipation and imaging is suggestive of a mechanical obstruction at the enteric enteric anastomosis in the mid bowel. We will keep patient n.p.o. for now Gastric decompression with NG tube IV fluid hydration Supportive care with antiemetics and IV PPI    History of atrial fibrillation Chronic On long-term anticoagulation as primary prophylaxis for an acute stroke Hold Xarelto for now Administer metoprolol 5mg  IV q 8 for rate control    Essential hypertension Hold oral antihypertensive medications IV hydralazine for systolic blood pressure greater than 160mmHg    Hypokalemia Secondary to GI loss from nausea and vomiting Supplement potassium   DVT prophylaxis: SCD Code Status: DNR Family Communication: Greater than 50% of time was spent discussing patient's condition and plan of care with her and her daughter at the bedside.  All questions and concerns have been addressed.  They verbalized understanding and agreed with the plan.  CODE STATUS was discussed and she is a DO NOT RESUSCITATE. Disposition Plan: Back to previous home environment Consults called: Surgery    Bacilio Abascal MD [facility]     10/01/2020, 4:34 PM

## 2020-10-01 NOTE — Telephone Encounter (Signed)
Patients daughter called in to make Dr. Okey Dupre aware patient is in the hospital [facility]). Daughter mentioned a potential blockage or obstruction.

## 2020-10-02 ENCOUNTER — Inpatient Hospital Stay: Payer: PPO

## 2020-10-02 ENCOUNTER — Encounter: Payer: Self-pay | Admitting: Internal Medicine

## 2020-10-02 ENCOUNTER — Inpatient Hospital Stay: Payer: Self-pay

## 2020-10-02 LAB — CBC
HCT: 36.5 % (ref 36.0–46.0)
Hemoglobin: 11.7 g/dL — ABNORMAL LOW (ref 12.0–15.0)
MCH: 25.5 pg — ABNORMAL LOW (ref 26.0–34.0)
MCHC: 32.1 g/dL (ref 30.0–36.0)
MCV: 79.5 fL — ABNORMAL LOW (ref 80.0–100.0)
Platelets: 198 10*3/uL (ref 150–400)
RBC: 4.59 MIL/uL (ref 3.87–5.11)
RDW: 15.3 % (ref 11.5–15.5)
WBC: 12.2 10*3/uL — ABNORMAL HIGH (ref 4.0–10.5)
nRBC: 0 % (ref 0.0–0.2)

## 2020-10-02 LAB — BASIC METABOLIC PANEL
Anion gap: 16 — ABNORMAL HIGH (ref 5–15)
BUN: 18 mg/dL (ref 8–23)
CO2: 23 mmol/L (ref 22–32)
Calcium: 9 mg/dL (ref 8.9–10.3)
Chloride: 105 mmol/L (ref 98–111)
Creatinine, Ser: 0.8 mg/dL (ref 0.44–1.00)
GFR, Estimated: >60 mL/min (ref >60)
Glucose, Bld: 118 mg/dL — ABNORMAL HIGH (ref 70–99)
Potassium: 4.1 mmol/L (ref 3.5–5.1)
Sodium: 144 mmol/L (ref 135–145)

## 2020-10-02 MED ORDER — TRAVASOL 10 % IV SOLN
INTRAVENOUS | Status: DC
  Filled 2020-10-02: qty 324

## 2020-10-02 MED ORDER — SODIUM CHLORIDE 0.9% FLUSH
10.0000 mL | Freq: Two times a day (BID) | INTRAVENOUS | Status: DC
  Administered 2020-10-02: 20 mL
  Administered 2020-10-03 (×2): 10 mL

## 2020-10-02 MED ORDER — SODIUM CHLORIDE 0.9% FLUSH: 10.0000 mL | INTRAVENOUS | Status: DC | PRN

## 2020-10-02 MED ORDER — TRACE MINERALS CU-MN-SE-ZN 300-55-60-3000 MCG/ML IV SOLN
INTRAVENOUS | Status: DC
  Filled 2020-10-02: qty 216

## 2020-10-02 MED ORDER — SODIUM CHLORIDE 0.9 % IV SOLN: INTRAVENOUS | Status: DC

## 2020-10-02 MED ORDER — CHLORHEXIDINE GLUCONATE CLOTH 2 % EX PADS
6.0000 | MEDICATED_PAD | Freq: Every day | CUTANEOUS | Status: DC
  Administered 2020-10-02 – 2020-10-03 (×2): 6 via TOPICAL

## 2020-10-02 NOTE — Progress Notes (Signed)
   10/02/20 0611  Clinical Encounter Type  Visited With Family  Visit Type Initial;Spiritual support  Referral From Nurse  Consult/Referral To Chaplain  Spiritual Encounters  Spiritual Needs Ritual  Advance Directives (For Healthcare)  Does Patient Have a Medical Advance Directive? No  Would patient like information on creating a medical advance directive? No - Patient declined  [place]  Does Patient Have a Mental Health Advance Directive? No  Would patient like information on creating a mental health advance directive? No - Patient declined  Chaplain Zalia Hautala responded to a page per the nurse to meet with a Pt's  family member, Mrs. Shantel Helwig. I met the daughter of Pt, Ms. [name] on the 2nd floor near the visiting waiting area. Ms. [name] requested a Idelle Crouch to come in and have communion with her mother. I advised Ms. [name] I would do the best I could to carry out her request. I informed her due to covid there may be some delay but I will call one. I also asked Ms. [name] in the event we cannot get a Idelle Crouch I am an Lawyer and we have others in our department who can assist you. Ms [name] stated, I rather it be a Idelle Crouch. I stated, no problem I will make the call to a priest. I asked Ms. [name] could I pray for her and she stated yes.Prayer was given and accepted.

## 2020-10-02 NOTE — Consult Note (Addendum)
PHARMACY - TOTAL PARENTERAL NUTRITION CONSULT NOTE   Indication: bowel obstruction  Patient Measurements: Height: 4\' 11"  (149.9 cm) Weight: 54.4 kg (120 lb) IBW/kg (Calculated) : 43.2 TPN AdjBW (KG): 46 Body mass index is 24.24 kg/m. Usual Weight: 54.4 kg  Assessment: Small bowel obstruction. Surgery planning to manage conservatively and no plan for intervention at this time. Due to malnutrition, starting TPN.  RD note to follow.   Glucose / Insulin: BG 118-131  Electrolytes: K+ 4.1,  Renal: at baseline  LFTs / TGs: WNL  Prealbumin / albumin: 3.5 Intake / Output; MIVF: + 1.8 L - on NS with KCl 40 mEq running at 100 ml/hr  GI Imaging: CT scan Mechanical small bowel obstruction with fluid-filled stomach, duodenum, and proximal small bowel with collapsed distal small bowel. 2. Potential site of obstruction at the enteric enteric anastomosis in the mid bowel. 3. No portal venous gas or intraperitoneal free air. No pneumatosis intestinalis. Surgeries / Procedures: none scheduled at this time.   Central access: Ordered PICC access  TPN start date: 1/21  Nutritional Goals (per RD recommendation on 1/21): kCal: 1200-1400 kcal/day, Protein: 60-70 g/day, Fluid: 1.1-1.3 L/day Goal TPN rate is 55 mL/hr (provides 66 g of protein and 1341 kcals per day)  Current Nutrition:  NPO and TPN  Plan:  Start TPN at 27 mL/hr at 1800 (half of goal rate). Will hold off on lipids at this time for about 7 days. Will need to re-calculate kcal once lipids are added.  Electrolytes in TPN: 76mEq/L of Na, 36mEq/L of K, 4mEq/L of Ca, 82mEq/L of Mg, and 63mmol/L of Phos. Cl:Ac ratio 1:1 Add standard MVI and trace elements to TPN Initiate Sensitive q6h SSI and adjust as needed  Reduce MIVF to 28 mL/hr at 1800 Monitor TPN labs on Mon/Thurs, BMP MG, Phos and Albumin. Weekly labs CBC, LFT, TG.   12m, PharmD, BCPS 10/02/2020,11:33 AM

## 2020-10-02 NOTE — Progress Notes (Signed)
CC: SBO Subjective: 87 year old female well-known to me with a prior history of internal hernia requiring laparotomy and bowel resection by me in 2018. On his back nausea and vomiting abdominal pain.  CT scan personally reviewed.  I have also personally reviewed the x-rays this morning.  There is some persistent dilation of the small bowel however there is no free air and there is no pneumatosis.  She does not endorse any abdominal pain. Currently she endorses some flatus. He still walks with a walker and she is still competent.  She has had some setbacks especially given her knee issues over the last year or so.  She seems to be more debilitated as compared to 4 years ago when I operated on her.  She was on anticoagulation  Objective: Vital signs in last 24 hours: Temp:  [97.5 F (36.4 C)-97.9 F (36.6 C)] 97.5 F (36.4 C) (01/21 0612) Pulse Rate:  [67-86] 75 (01/21 0612) Resp:  [15-22] 20 (01/21 0612) BP: (127-176)/(56-94) 127/60 (01/21 0612) SpO2:  [93 %-99 %] 96 % (01/21 0612) Last BM Date:  (unknown)  Intake/Output from previous day: 01/20 0701 - 01/21 0700 In: 1817.5 [I.V.:817.5; IV Piggyback:1000] Out: -  Intake/Output this shift: No intake/output data recorded.  Physical exam: Malnourished and fragile NAD Alert and awake MVH:QION , nt decrease bs, no peritonitis   Lab Results: CBC  Recent Labs    10/01/20 0911 10/02/20 0645  WBC 7.8 12.2*  HGB 12.6 11.7*  HCT 39.3 36.5  PLT 232 198   BMET Recent Labs    10/01/20 0911 10/02/20 0645  NA 139 144  K 3.3* 4.1  CL 97* 105  CO2 27 23  GLUCOSE 131* 118*  BUN 19 18  CREATININE 0.83 0.80  CALCIUM 9.5 9.0   PT/INR No results for input(s): LABPROT, INR in the last 72 hours. ABG No results for input(s): PHART, HCO3 in the last 72 hours.  Invalid input(s): PCO2, PO2  Studies/Results: CT Abdomen Pelvis W Contrast  Result Date: 10/01/2020 EXAM: CT ABDOMEN AND PELVIS WITH CONTRAST TECHNIQUE: Multidetector CT  imaging of the abdomen and pelvis was performed using the standard protocol following bolus administration of intravenous contrast. CONTRAST:  52mL OMNIPAQUE IOHEXOL 300 MG/ML  SOLN COMPARISON:  None. FINDINGS: Lower chest: Lung bases are clear. Hepatobiliary: No focal hepatic lesion.  No biliary duct dilatation. Pancreas: Pancreas is normal. No ductal dilatation. No pancreatic inflammation. Spleen: Normal spleen Adrenals/urinary tract: Adrenal glands normal. Bilateral renal cortical thinning. Bladder normal Stomach/Bowel: Stomach is filled with fluid. The duodenum is fluid-filled as well as the proximal small bowel. Proximal small bowel measures 3.2 cm in diameter. The distal small bowel is collapsed measuring 0.8 cm. No portal venous gas. No intraperitoneal free air. No pneumatosis intestinalis. There is an enteric enteric anastomosis in the central abdomen. Transition point appears to be in the vicinity of this anastomosis. (Image 54/2). Colon rectosigmoid colon are collapsed. Stool in the rectum. Gas the rectum Vascular/Lymphatic: Abdominal aorta is normal caliber with atherosclerotic calcification. There is no retroperitoneal or periportal lymphadenopathy. No pelvic lymphadenopathy. Reproductive: Post hysterectomy.  Adnexa unremarkable Other: No free fluid. Musculoskeletal: No aggressive osseous lesion. IMPRESSION: 1. Mechanical small bowel obstruction with fluid-filled stomach, duodenum, and proximal small bowel with collapsed distal small bowel. 2. Potential site of obstruction at the enteric enteric anastomosis in the mid bowel. 3. No portal venous gas or intraperitoneal free air. No pneumatosis intestinalis. Findings conveyed toEVAN BRADLER on 10/01/2020  at15:20. Electronically Signed   By:  Genevive Bi M.D.   On: 10/01/2020 15:27   DG Abd Portable 1 View  Result Date: 10/01/2020 CLINICAL DATA:  NG tube placed EXAM: PORTABLE ABDOMEN - 1 VIEW COMPARISON:  None. FINDINGS: NG tube extends into the  stomach.  Side port at the GE junction. IMPRESSION: NG tube in stomach. Electronically Signed   By: Genevive Bi M.D.   On: 10/01/2020 18:26   DG Abd Portable 2V  Result Date: 10/02/2020 CLINICAL DATA:  Small bowel obstruction. EXAM: PORTABLE ABDOMEN - 2 VIEW COMPARISON:  October 01, 2020. FINDINGS: Dilated small bowel loops are seen in the left side of the abdomen concerning for small bowel obstruction. Distal tip of nasogastric tube is seen in proximal stomach. IMPRESSION: Dilated small bowel loops are noted in left side of the abdomen concerning for small bowel obstruction. Electronically Signed   By: Lupita Raider M.D.   On: 10/02/2020 09:11   [place] EKG SITE RITE  Result Date: 10/02/2020 If Site Rite image not attached, placement could not be confirmed due to current cardiac rhythm.   Anti-infectives: Anti-infectives (From admission, onward)   None      Assessment/Plan: Small bowel obstruction.  Will manage conservatively.  There are no alarming signs at this time that will mandate emergent intervention she is almost 87 year old and debilitated.  I did think that it might be in her best interest to avoid any aggressive measurements. Given malnutrition and lack of p.o. intake and expected bowel obstruction we will start TPN order a PICC line Discussed with her daughter in detail.  She is very Adult nurse. Ok to start heparin drip per hospitalist service D/W Dr. Lucianne Muss in detail Please note that I spent greater than 40 minutes in this encounter with more than 50% spent in coordination and counseling of her care. Sterling Big, MD, [facility]  10/02/2020

## 2020-10-02 NOTE — ED Notes (Signed)
Pt placed on purewick 

## 2020-10-02 NOTE — Telephone Encounter (Signed)
Spoke to patient's daughter, [name], ok per DPR. She talked at length about the patient's fall in December and the chain of events since then that has lead up to her current admission. She is very upset and expressed her disappointment that only one person can visit during the whole hospitalization. She spoke with the Chaplain and asked if a Zorita Pang could read her last rites and they do not think that can happen due to COVID. 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 She is praying she is making the right decisions for care and does not want to lose her mom yet and in this way. I encouraged patient that she did not do anything wrong and to take care of herself as well. Daughter has lost 18 pounds since the fall in December.  Patient's sister had the same thing and said they could not do the surgery because the she had a "bad heart." [name] would like to know what Dr End thinks concerning patient's heart and her Afib.  Advised I will route to Dr End to make him aware. I asked her to keep up to date and expressed my concern.

## 2020-10-02 NOTE — Progress Notes (Signed)
Initial Nutrition Assessment  DOCUMENTATION CODES:   Non-severe (moderate) malnutrition in context of chronic illness  INTERVENTION:   TPN per pharmacy   Pt at high refeed risk; recommend monitor potassium, magnesium and phosphorus labs daily until stable  NUTRITION DIAGNOSIS:   Moderate Malnutrition related to chronic illness (Ischemic bowel disease) as evidenced by mild fat depletion,moderate fat depletion,mild muscle depletion,moderate muscle depletion.  GOAL:   Patient will meet greater than or equal to 90% of their needs  MONITOR:   Labs,Weight trends,Skin,I & O's,Diet advancement,Other (Comment) (TPN)  REASON FOR ASSESSMENT:   Consult New TPN/TNA  ASSESSMENT:   87 y.o. female with medical history significant for hypertension, A. 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 Pt reports good appetite and oral intake at baseline but reports poor appetite and oral intake for 2 days pta r/t nausea and vomiting. Pt reports that she is feeling much better today. Pt denies any nausea or abdominal pain. Pt's main complaint is that her mouth is dry and she is requesting ice chips. NGT in place to LIS. Plan is for PICC line and TPN today. Pt reports that she drinks Ensure at home and she is willing to drink this in hospital once her diet is advanced. Pt is likely at refeed risk. Per chart, pt appears weight stable at baseline.    Medications reviewed and include: protonix, NaCl '@28ml' /hr  Labs reviewed: wbc- 12.2(H)  NUTRITION - FOCUSED PHYSICAL EXAM:  Flowsheet Row Most Recent Value  Orbital Region Moderate depletion  Upper Arm Region Mild depletion  Thoracic and Lumbar Region Moderate depletion  Buccal Region Mild depletion  Temple Region Moderate depletion  Clavicle Bone Region Moderate depletion  Clavicle and Acromion Bone Region Moderate depletion  Scapular  Bone Region Mild depletion  Dorsal Hand Severe depletion  Patellar Region Mild depletion  Anterior Thigh Region Mild depletion  Posterior Calf Region Mild depletion  Edema (RD Assessment) None  Hair Reviewed  Eyes Reviewed  Mouth Reviewed  Skin Reviewed  Nails Reviewed     Diet Order:   Diet Order            Diet NPO time specified  Diet effective now                EDUCATION NEEDS:   Education needs have been addressed  Skin:  Skin Assessment: Reviewed RN Assessment  Last BM:  12/21  Height:   Ht Readings from Last 1 Encounters:  10/01/20 '4\' 11"'  (1.499 m)    Weight:   Wt Readings from Last 1 Encounters:  10/01/20 54.4 kg    Ideal Body Weight:  44.5 kg  BMI:  Body mass index is 24.24 kg/m.  Estimated Nutritional Needs:   Kcal:  1200-1400kcal/day  Protein:  60-70g/day  Fluid:  1.1-1.3L/day  Koleen Distance MS, RD, LDN Please refer to [facility] for RD and/or RD on-call/weekend/after hours pager

## 2020-10-02 NOTE — Progress Notes (Signed)
[facility] Progress Note  Patient: Paula Luna    NKN:[id number]  DOA: 10/01/2020     Date of Service: the patient was seen and examined on 10/02/2020  Chief Complaint  Patient presents with  . Emesis   Brief hospital course: Paula Luna is a 87 y.o. female with PMH of HTN, A. 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  Patient feels like she has a "knot" in her upper abdomen.  She has not had a "good" bowel movement in days.   ED w/up: Showed small bowel obstruction.  Neurosurgery consulted and patient admitted for further management as below  CT scan of abdomen and pelvis shows mechanical small bowel obstruction with fluid-filled stomach,duodenum, and proximal small bowel with collapsed distal small Bowel. Potential site of obstruction at the enteric enteric anastomosis in the mid bowel. No portal venous gas or intraperitoneal free air. No pneumatosis Intestinalis. Labs show sodium 139, potassium 3.3, chloride 97, bicarb 27, glucose 131, BUN 19, creatinine 0.83, calcium 9.5, alkaline phosphatase 63, albumin 3.5, lipase 49, AST 19, ALT 12, white count 7.8, hemoglobin 12.6, hematocrit 39.3, MCV 78.9, RDW 15, platelet count 232 COVID-19 PCR testing is pending Twelve-lead EKG is also pending at the time of this H&P   Assessment and Plan: Principal Problem:   SBO (small bowel obstruction) (HCC) Active Problems:   Essential hypertension   Atrial fibrillation (HCC)     Small bowel obstruction Patient presents for evaluation of abdominal pain, nausea, vomiting and constipation and imaging is suggestive of a mechanical obstruction at the enteric enteric anastomosis in the mid bowel. keep patient n.p.o.  Gastric decompression with NG tube IV fluid hydration Supportive  care with antiemetics and IV PPI General surgery following, continue conservative management for now General surgery commended PICC line and TPN  History of atrial fibrillation Chronic On long-term anticoagulation as primary prophylaxis for an acute stroke Hold Xarelto for now Administer metoprolol 5mg  IV q 8 for rate control    Essential hypertension Hold oral antihypertensive medications IV hydralazine for systolic blood pressure greater than    Hypokalemia, resolved Secondary to GI loss from nausea and vomiting Supplement potassium  Body mass index is 24.24 kg/m.       Diet: NPO DVT Prophylaxis: SCD, pharmacological prophylaxis contraindicated due to Possibility of surgical intervention   Advance goals of care discussion: DNR  Family Communication: family was not present at bedside, at the time of interview.  The pt provided permission to discuss medical plan with the family. Opportunity was given to ask question and all questions were answered satisfactorily.   Disposition:  Pt is from Home, admitted with SBO, still has SBO, which precludes a safe discharge. Discharge to home versus SNF depends on recovery, when SBO will resolve and cleared by general surgery.  Subjective: No significant overnight events, patient was admitted due to small bowel obstruction.  Patient has NG tube with intermittent suction.  Abdominal pain has been resolved, patient is passing gas a little bit, no BM yet.  Patient denied any other active issues   Physical Exam: General:  alert oriented to place and person.  Appear in mild distress, affect appropriate Eyes: PERRLA ENT: Oral Mucosa Clear, moist  Neck: no JVD,  Cardiovascular: S1 and S2 Present, no Murmur,  Respiratory: good respiratory  effort, Bilateral Air entry equal and Decreased, no Crackles, no wheezes Abdomen: Bowel Sound present, Soft and no tenderness,  Skin: no rashes Extremities: no Pedal edema, no calf  tenderness Neurologic: without any new focal findings Gait not checked due to patient safety concerns  Vitals:   10/01/20 2300 10/01/20 2308 10/02/20 0215 10/02/20 0612  BP:  138/62 (!) 127/56 127/60  Pulse: 84 86 72 75  Resp: 20 (!) 22 20 20   Temp:    (!) 97.5 F (36.4 C)  TempSrc:    Oral  SpO2: 99% 99% 97% 96%  Weight:      Height:        Intake/Output Summary (Last 24 hours) at 10/02/2020 1413 Last data filed at 10/02/2020 0350 Gross per 24 hour  Intake 1817.5 ml  Output --  Net 1817.5 ml   Filed Weights   10/01/20 0910  Weight: 54.4 kg    Data Reviewed: I have personally reviewed and interpreted daily labs, tele strips, imagings as discussed above. I reviewed all nursing notes, pharmacy notes, vitals, pertinent old records I have discussed plan of care as described above with RN and patient/family.  CBC: Recent Labs  Lab 10/01/20 0911 10/02/20 0645  WBC 7.8 12.2*  HGB 12.6 11.7*  HCT 39.3 36.5  MCV 78.9* 79.5*  PLT 232 198   Basic Metabolic Panel: Recent Labs  Lab 10/01/20 0911 10/02/20 0645  NA 139 144  K 3.3* 4.1  CL 97* 105  CO2 27 23  GLUCOSE 131* 118*  BUN 19 18  CREATININE 0.83 0.80  CALCIUM 9.5 9.0    Studies: CT Abdomen Pelvis W Contrast  Result Date: 10/01/2020 EXAM: CT ABDOMEN AND PELVIS WITH CONTRAST TECHNIQUE: Multidetector CT imaging of the abdomen and pelvis was performed using the standard protocol following bolus administration of intravenous contrast. CONTRAST:  68mL OMNIPAQUE IOHEXOL 300 MG/ML  SOLN COMPARISON:  None. FINDINGS: Lower chest: Lung bases are clear. Hepatobiliary: No focal hepatic lesion.  No biliary duct dilatation. Pancreas: Pancreas is normal. No ductal dilatation. No pancreatic inflammation. Spleen: Normal spleen Adrenals/urinary tract: Adrenal glands normal. Bilateral renal cortical thinning. Bladder normal Stomach/Bowel: Stomach is filled with fluid. The duodenum is fluid-filled as well as the proximal small bowel.  Proximal small bowel measures 3.2 cm in diameter. The distal small bowel is collapsed measuring 0.8 cm. No portal venous gas. No intraperitoneal free air. No pneumatosis intestinalis. There is an enteric enteric anastomosis in the central abdomen. Transition point appears to be in the vicinity of this anastomosis. (Image 54/2). Colon rectosigmoid colon are collapsed. Stool in the rectum. Gas the rectum Vascular/Lymphatic: Abdominal aorta is normal caliber with atherosclerotic calcification. There is no retroperitoneal or periportal lymphadenopathy. No pelvic lymphadenopathy. Reproductive: Post hysterectomy.  Adnexa unremarkable Other: No free fluid. Musculoskeletal: No aggressive osseous lesion. IMPRESSION: 1. Mechanical small bowel obstruction with fluid-filled stomach, duodenum, and proximal small bowel with collapsed distal small bowel. 2. Potential site of obstruction at the enteric enteric anastomosis in the mid bowel. 3. No portal venous gas or intraperitoneal free air. No pneumatosis intestinalis. Findings conveyed toEVAN BRADLER on 10/01/2020  at15:20. Electronically Signed   By: 10/03/2020 M.D.   On: 10/01/2020 15:27   DG Abd Portable 1 View  Result Date: 10/01/2020 CLINICAL DATA:  NG tube placed EXAM: PORTABLE ABDOMEN - 1 VIEW COMPARISON:  None. FINDINGS: NG tube extends into the stomach.  Side port at the GE junction. IMPRESSION: NG tube in stomach. Electronically Signed   By: 10/03/2020  Amil Amen M.D.   On: 10/01/2020 18:26   DG Abd Portable 2V  Result Date: 10/02/2020 CLINICAL DATA:  Small bowel obstruction. EXAM: PORTABLE ABDOMEN - 2 VIEW COMPARISON:  October 01, 2020. FINDINGS: Dilated small bowel loops are seen in the left side of the abdomen concerning for small bowel obstruction. Distal tip of nasogastric tube is seen in proximal stomach. IMPRESSION: Dilated small bowel loops are noted in left side of the abdomen concerning for small bowel obstruction. Electronically Signed   By: Lupita Raider M.D.   On: 10/02/2020 09:11   [place] EKG SITE RITE  Result Date: 10/02/2020 If Site Rite image not attached, placement could not be confirmed due to current cardiac rhythm.   Scheduled Meds: . fluticasone  1 spray Each Nare Daily  . metoprolol tartrate  5 mg Intravenous Q8H  . pantoprazole (PROTONIX) IV  40 mg Intravenous Q24H   Continuous Infusions: . sodium chloride    . 0.9 % NaCl with KCl 40 mEq / L 100 mL/hr at 10/02/20 1200  . TPN ADULT (ION)     PRN Meds: hydrALAZINE, morphine injection, ondansetron **OR** ondansetron (ZOFRAN) IV  Time spent: 35 minutes  Author: Gillis Santa. MD [facility] 10/02/2020 2:13 PM  To reach On-call, see care teams to locate the attending and reach out to them via [email or web address]. If 7PM-7AM, please contact night-coverage If you still have difficulty reaching the attending provider, please page the [facility] (Director on Call) for [facility] on amion for assistance.

## 2020-10-02 NOTE — Plan of Care (Signed)
Continuing with plan of care. 

## 2020-10-02 NOTE — Progress Notes (Signed)
Peripherally Inserted Central Catheter Placement  The IV Nurse has discussed with the patient and/or persons authorized to consent for the patient, the purpose of this procedure and the potential benefits and risks involved with this procedure.  The benefits include less needle sticks, lab draws from the catheter, and the patient may be discharged home with the catheter. Risks include, but not limited to, infection, bleeding, blood clot (thrombus formation), and puncture of an artery; nerve damage and irregular heartbeat and possibility to perform a PICC exchange if needed/ordered by physician.  Alternatives to this procedure were also discussed.  Bard Power PICC patient education guide, fact sheet on infection prevention and patient information card has been provided to patient /or left at bedside.    PICC Placement Documentation  PICC Double Lumen 10/02/20 PICC Right Brachial 38 cm 0 cm (Active)  Indication for Insertion or Continuance of Line Administration of hyperosmolar/irritating solutions (i.e. TPN, Vancomycin, etc.) 10/02/20 1900  Exposed Catheter (cm) 0 cm 10/02/20 1900  Site Assessment Clean;Dry;Intact 10/02/20 1900  Lumen #1 Status Flushed;Blood return noted 10/02/20 1900  Lumen #2 Status Flushed;Blood return noted 10/02/20 1900  Dressing Type Transparent 10/02/20 1900  Dressing Status Clean;Dry;Intact 10/02/20 1900  Antimicrobial disc in place? Yes 10/02/20 1900  Dressing Intervention New dressing 10/02/20 1900  Dressing Change Due 10/09/20 10/02/20 1900       Dorena Bodo 10/02/2020, 7:28 PM

## 2020-10-03 ENCOUNTER — Inpatient Hospital Stay: Payer: PPO

## 2020-10-03 LAB — BASIC METABOLIC PANEL
Anion gap: 13 (ref 5–15)
BUN: 24 mg/dL — ABNORMAL HIGH (ref 8–23)
CO2: 27 mmol/L (ref 22–32)
Calcium: 9.5 mg/dL (ref 8.9–10.3)
Chloride: 107 mmol/L (ref 98–111)
Creatinine, Ser: 0.79 mg/dL (ref 0.44–1.00)
GFR, Estimated: >60 mL/min (ref >60)
Glucose, Bld: 197 mg/dL — ABNORMAL HIGH (ref 70–99)
Potassium: 3.9 mmol/L (ref 3.5–5.1)
Sodium: 147 mmol/L — ABNORMAL HIGH (ref 135–145)

## 2020-10-03 LAB — CBC
HCT: 33.8 % — ABNORMAL LOW (ref 36.0–46.0)
Hemoglobin: 10.7 g/dL — ABNORMAL LOW (ref 12.0–15.0)
MCH: 25.4 pg — ABNORMAL LOW (ref 26.0–34.0)
MCHC: 31.7 g/dL (ref 30.0–36.0)
MCV: 80.1 fL (ref 80.0–100.0)
Platelets: 210 10*3/uL (ref 150–400)
RBC: 4.22 MIL/uL (ref 3.87–5.11)
RDW: 15.6 % — ABNORMAL HIGH (ref 11.5–15.5)
WBC: 9.7 10*3/uL (ref 4.0–10.5)
nRBC: 0 % (ref 0.0–0.2)

## 2020-10-03 LAB — TRIGLYCERIDES: Triglycerides: 88 mg/dL (ref <150)

## 2020-10-03 LAB — MAGNESIUM: Magnesium: 1.9 mg/dL (ref 1.7–2.4)

## 2020-10-03 LAB — PHOSPHORUS: Phosphorus: 2.4 mg/dL — ABNORMAL LOW (ref 2.5–4.6)

## 2020-10-03 MED ORDER — HALOPERIDOL LACTATE 5 MG/ML IJ SOLN
2.0000 mg | Freq: Four times a day (QID) | INTRAMUSCULAR | Status: DC | PRN
  Administered 2020-10-03: 2 mg via INTRAVENOUS
  Filled 2020-10-03: qty 1

## 2020-10-03 MED ORDER — DEXTROSE-NACL 5-0.45 % IV SOLN: INTRAVENOUS | Status: DC

## 2020-10-03 MED ORDER — POTASSIUM PHOSPHATES 15 MMOLE/5ML IV SOLN
20.0000 mmol | Freq: Once | INTRAVENOUS | Status: AC
  Administered 2020-10-03: 20 mmol via INTRAVENOUS
  Filled 2020-10-03: qty 6.67

## 2020-10-03 MED ORDER — INSULIN ASPART 100 UNIT/ML ~~LOC~~ SOLN: 0.0000 [IU] | Freq: Four times a day (QID) | SUBCUTANEOUS | Status: DC

## 2020-10-03 MED ORDER — TRACE MINERALS CU-MN-SE-ZN 300-55-60-3000 MCG/ML IV SOLN
INTRAVENOUS | Status: DC
  Filled 2020-10-03: qty 466.4

## 2020-10-03 NOTE — Progress Notes (Signed)
Patient confused and pulled of NG tube. Was heading back to put mittens on patient but she had already pulled out PICC line. Provider notified, ordered D51/2  NS, one time glucose check, and  PIV was put in the Right forearm, wrapped. Patient is calm and cooperative at moment.

## 2020-10-03 NOTE — Plan of Care (Signed)
Continuing with plan of care. 

## 2020-10-03 NOTE — Consult Note (Signed)
PHARMACY - TOTAL PARENTERAL NUTRITION CONSULT NOTE   Indication: bowel obstruction  Patient Measurements: Height: 4\' 11"  (149.9 cm) Weight: 54.4 kg (120 lb) IBW/kg (Calculated) : 43.2 TPN AdjBW (KG): 46 Body mass index is 24.24 kg/m. Usual Weight: 54.4 kg  Assessment: Small bowel obstruction. Surgery planning to manage conservatively and no plan for intervention at this time. Due to malnutrition, starting TPN.   Glucose / Insulin: BG 118-197 Electrolytes: K+ 4.1,  Renal: at baseline  LFTs / TGs: WNL  Prealbumin / albumin: 3.5 Intake / Output; MIVF: + 1.8 L - on NS with KCl 40 mEq running at 100 ml/hr  GI Imaging: CT scan Mechanical small bowel obstruction with fluid-filled stomach, duodenum, and proximal small bowel with collapsed distal small bowel. 2. Potential site of obstruction at the enteric enteric anastomosis in the mid bowel. 3. No portal venous gas or intraperitoneal free air. No pneumatosis intestinalis. Surgeries / Procedures: none scheduled at this time.   Central access: Ordered PICC access  TPN start date: 1/21  Nutritional Goals (per RD recommendation on 1/21): kCal: 1200-1400 kcal/day, Protein: 60-70 g/day, Fluid: 1.1-1.3 L/day  Current Nutrition:  NPO and TPN  Plan:   Advance TPN to 55 mL/hr at 1800  Nutritional components:  Amino acids: 53 g/L (69.9 grams)  Dextrose: 24 % (316.8 grams)  Continue to hold lipids given national backorder status  KCal: 1356.9/day  Total volume: 1420 mL  Electrolytes in TPN (standard): 30mEq/L of Na, 68mEq/L of K, 49mEq/L of Ca, 32mEq/L of Mg, and 73mmol/L of Phos. Cl:Ac ratio 1:1  Add standard MVI and trace elements to TPN  Initiate Sensitive q6h SSI and adjust as needed   20 mmol IV potassium phosphate x 1 per Dr 12m  D51/2NS @ 100 mL/hr per Lucianne Muss, NP  Monitor TPN labs on Mon/Thurs, BMP MG, Phos and Albumin. Weekly labs CBC, LFT, TG.   Reyes Ivan, PharmD, BCPS 10/03/2020,7:05 AM

## 2020-10-03 NOTE — Progress Notes (Addendum)
Secure chat with Dr Lucianne Muss re PICC order due to pt pulling PICC out this am, less than 12 hours after placement.  States will refer to surgery for central placement. Tresa Endo Rn notified.

## 2020-10-03 NOTE — Progress Notes (Signed)
Subjective:  CC: Paula Luna is a 87 y.o. female  Hospital stay day 2,   SBO  HPI: Episode of confusion last night, pulled NG tube out, could not place PICC. Currently, sleeping due to meds given.  Wakes up with touch, complains of minor TTP around umbilicus.    ROS:  General: Denies weight loss, weight gain, fatigue, fevers, chills, and night sweats. Heart: Denies chest pain, palpitations, racing heart, irregular heartbeat, leg pain or swelling, and decreased activity tolerance. Respiratory: Denies breathing difficulty, shortness of breath, wheezing, cough, and sputum. GI: Denies change in appetite, heartburn, nausea, vomiting, constipation, diarrhea, and blood in stool. GU: Denies difficulty urinating, pain with urinating, urgency, frequency, blood in urine.   Objective:   Temp:  [97.4 F (36.3 C)-98.4 F (36.9 C)] 98.1 F (36.7 C) (01/22 1531) Pulse Rate:  [57-83] 75 (01/22 1531) Resp:  [12-16] 16 (01/22 1531) BP: (140-166)/(68-92) 166/92 (01/22 1531) SpO2:  [92 %-98 %] 92 % (01/22 1531)     Height: 4\' 11"  (149.9 cm) Weight: 54.4 kg BMI (Calculated): 24.22   Intake/Output this shift:   Intake/Output Summary (Last 24 hours) at 10/03/2020 1721 Last data filed at 10/03/2020 1701 Gross per 24 hour  Intake 1226.24 ml  Output 1025 ml  Net 201.24 ml    Constitutional :  cooperative, no distress and somewhat somnolent  Respiratory:  clear to auscultation bilaterally  Cardiovascular:  regular rate and rhythm  Gastrointestinal: soft, no guarding, no distention, slight TTP in periumbilical area.   Skin: Cool and moist.   Psychiatric: Normal affect, non-agitated, not confused       LABS:  CMP Latest Ref Rng & Units 10/03/2020 10/02/2020 10/01/2020  Glucose 70 - 99 mg/dL 10/03/2020) 409(B) 353(G)  BUN 8 - 23 mg/dL 992(E) 18 19  Creatinine 0.44 - 1.00 mg/dL 26(S 3.41 9.62  Sodium 135 - 145 mmol/L 147(H) 144 139  Potassium 3.5 - 5.1 mmol/L 3.9 4.1 3.3(L)  Chloride 98 - 111 mmol/L 107  105 97(L)  CO2 22 - 32 mmol/L 27 23 27   Calcium 8.9 - 10.3 mg/dL 9.5 9.0 9.5  Total Protein 6.5 - 8.1 g/dL - - 6.8  Total Bilirubin 0.3 - 1.2 mg/dL - - 1.0  Alkaline Phos 38 - 126 U/L - - 63  AST 15 - 41 U/L - - 19  ALT 0 - 44 U/L - - 12   CBC Latest Ref Rng & Units 10/03/2020 10/02/2020 10/01/2020  WBC 4.0 - 10.5 K/uL 9.7 12.2(H) 7.8  Hemoglobin 12.0 - 15.0 g/dL 10.7(L) 11.7(L) 12.6  Hematocrit 36.0 - 46.0 % 33.8(L) 36.5 39.3  Platelets 150 - 400 K/uL 210 198 232    RADS: n/a Assessment:   SBO Stable for now, no report of flatus or BM yet.  Clinical exam with relatively benign abdomen.  Due to her confusion, we will keep NG tube out to see if this will resolve on its own.  Ideally would like to have PICC in place for TPN, but will hold off tonight to see if there is any improvement in her confusion at nighttime prior to attempting again.  Daughter and nurse updated with plan and they are in agreement.  Further management of chronic conditions per primary team.

## 2020-10-03 NOTE — Progress Notes (Signed)
[facility] Progress Note  Patient: Paula Luna    QQI:[id number]  DOA: 10/01/2020     Date of Service: the patient was seen and examined on 10/03/2020  Chief Complaint  Patient presents with  . Emesis   Brief hospital course: Paula Luna is a 87 y.o. female with PMH of HTN, A. 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  Patient feels like she has a "knot" in her upper abdomen.  She has not had a "good" bowel movement in days.   ED w/up: Showed small bowel obstruction.  Neurosurgery consulted and patient admitted for further management as below  CT scan of abdomen and pelvis shows mechanical small bowel obstruction with fluid-filled stomach,duodenum, and proximal small bowel with collapsed distal small Bowel. Potential site of obstruction at the enteric enteric anastomosis in the mid bowel. No portal venous gas or intraperitoneal free air. No pneumatosis Intestinalis. Labs show sodium 139, potassium 3.3, chloride 97, bicarb 27, glucose 131, BUN 19, creatinine 0.83, calcium 9.5, alkaline phosphatase 63, albumin 3.5, lipase 49, AST 19, ALT 12, white count 7.8, hemoglobin 12.6, hematocrit 39.3, MCV 78.9, RDW 15, platelet count 232 COVID-19 PCR testing is pending Twelve-lead EKG is also pending at the time of this H&P   Assessment and Plan: Principal Problem:   SBO (small bowel obstruction) (HCC) Active Problems:   Essential hypertension   Atrial fibrillation (HCC)     Small bowel obstruction Patient presents for evaluation of abdominal pain, nausea, vomiting and constipation and imaging is suggestive of a mechanical obstruction at the enteric enteric anastomosis in the mid bowel. keep patient n.p.o.  Gastric decompression with NG tube IV fluid hydration Supportive  care with antiemetics and IV PPI General surgery following, continue conservative management for now General surgery commended PICC line and TPN 1/22 AXR repeated still shows small bowel obstruction.  Patient pulled out NG tube and PICC line last night due to confusion which will be reinserted    History of atrial fibrillation Chronic On long-term anticoagulation as primary prophylaxis for an acute stroke Hold Xarelto for now Administer metoprolol 5mg  IV q 8 for rate control   Essential hypertension Hold oral antihypertensive medications IV hydralazine for systolic blood pressure greater than   Hypokalemia, resolved Secondary to GI loss from nausea and vomiting Supplement potassium  Hypophosphatemia, phos repleted.  Monitor and replete as needed.   Agitation Use Haldol as needed Use mittens as needed  Body mass index is 24.24 kg/m.       Diet: NPO DVT Prophylaxis: SCD, pharmacological prophylaxis contraindicated due to Possibility of surgical intervention   Advance goals of care discussion: DNR  Family Communication: family was not present at bedside, at the time of interview.  The pt provided permission to discuss medical plan with the family. Opportunity was given to ask question and all questions were answered satisfactorily.   Disposition:  Pt is from Home, admitted with SBO, still has SBO, which precludes a safe discharge. Discharge to home versus SNF depends on recovery, when SBO will resolve and cleared by general surgery.  Subjective: Overnight patient was very agitated and pulled the NG tube out and pulled PICC line out.  In the morning time patient was very calm and cooperative. She remains disoriented, severe dementia AAO x1 Patient is unable to  offer any complaints, seems to be resting comfortably.    Physical Exam: General:  Alert, disoriented and confused   Appear in mild distress, affect appropriate Eyes: PERRLA ENT: Oral Mucosa  Clear, moist  Neck: no JVD,  Cardiovascular: S1 and S2 Present, no Murmur,  Respiratory: good respiratory effort, Bilateral Air entry equal and Decreased, no Crackles, no wheezes Abdomen: Bowel Sound sluggish, soft and no tenderness,  Skin: no rashes Extremities: no Pedal edema, no calf tenderness Neurologic: without any new focal findings Gait not checked due to patient safety concerns  Vitals:   10/02/20 1947 10/02/20 2342 10/03/20 0353 10/03/20 0817  BP: 140/76 (!) 157/68 (!) 159/77 (!) 156/69  Pulse: 72 83 63 (!) 57  Resp: 12 12 16 16   Temp: 98.4 F (36.9 C) 98.1 F (36.7 C) 98.2 F (36.8 C) (!) 97.4 F (36.3 C)  TempSrc: Oral Oral Oral Oral  SpO2: 98% 97% 95% 93%  Weight:      Height:        Intake/Output Summary (Last 24 hours) at 10/03/2020 1509 Last data filed at 10/03/2020 1300 Gross per 24 hour  Intake 578.65 ml  Output 1025 ml  Net -446.35 ml   Filed Weights   10/01/20 0910  Weight: 54.4 kg    Data Reviewed: I have personally reviewed and interpreted daily labs, tele strips, imagings as discussed above. I reviewed all nursing notes, pharmacy notes, vitals, pertinent old records I have discussed plan of care as described above with RN and patient/family.  CBC: Recent Labs  Lab 10/01/20 0911 10/02/20 0645 10/03/20 0453  WBC 7.8 12.2* 9.7  HGB 12.6 11.7* 10.7*  HCT 39.3 36.5 33.8*  MCV 78.9* 79.5* 80.1  PLT 232 198 210   Basic Metabolic Panel: Recent Labs  Lab 10/01/20 0911 10/02/20 0645 10/03/20 0453  NA 139 144 147*  K 3.3* 4.1 3.9  CL 97* 105 107  CO2 27 23 27   GLUCOSE 131* 118* 197*  BUN 19 18 24*  CREATININE 0.83 0.80 0.79  CALCIUM 9.5 9.0 9.5  MG  --   --  1.9  PHOS  --   --  2.4*    Studies: DG Chest Port 1 View  Result Date: 10/02/2020 CLINICAL DATA:  Central line placement EXAM: PORTABLE CHEST 1 VIEW COMPARISON:  03/19/2017 FINDINGS: Esophageal tube tip overlies the proximal stomach. Right upper extremity central venous  catheter tip over the distal SVC. Chronic elevation right diaphragm. Patchy infiltrates at the left lower lung. Stable cardiomediastinal silhouette with aortic atherosclerosis. No pneumothorax IMPRESSION: 1. Right upper extremity central venous catheter tip overlies the distal SVC. 2. Patchy infiltrates at the left base. Electronically Signed   By: 10/04/2020 M.D.   On: 10/02/2020 20:01   DG ABD ACUTE 2+V W 1V CHEST  Result Date: 10/03/2020 CLINICAL DATA:  Small bowel obstruction. EXAM: DG ABDOMEN ACUTE WITH 1 VIEW CHEST COMPARISON:  Abdominal radiograph 10/02/2020 FINDINGS: Interval removal enteric tube. Cardiomegaly. Aortic atherosclerosis. Bibasilar airspace opacities. Redemonstrated gaseous distended loops of small bowel within the left hemiabdomen. Contrast material within the urinary bladder. Lumbar spine degenerative changes. IMPRESSION: Redemonstrated gaseous distended loops of small bowel within the left hemiabdomen compatible with small bowel obstruction. Electronically Signed   By: 10/05/2020 M.D.   On: 10/03/2020 08:05    Scheduled Meds: . Chlorhexidine Gluconate Cloth  6 each Topical Daily  . fluticasone  1 spray Each Nare Daily  . insulin aspart  0-9 Units Subcutaneous Q6H  . metoprolol  tartrate  5 mg Intravenous Q8H  . pantoprazole (PROTONIX) IV  40 mg Intravenous Q24H  . sodium chloride flush  10-40 mL Intracatheter Q12H   Continuous Infusions: . dextrose 5 % and 0.45% NaCl 100 mL/hr at 10/03/20 0655  . potassium PHOSPHATE IVPB (in mmol)    . TPN ADULT (ION) 27 mL/hr at 10/02/20 2045  . TPN ADULT (ION)     PRN Meds: haloperidol lactate, hydrALAZINE, morphine injection, ondansetron **OR** ondansetron (ZOFRAN) IV, sodium chloride flush  Time spent: 35 minutes  Author: Gillis Santa. MD [facility] 10/03/2020 3:09 PM  To reach On-call, see care teams to locate the attending and reach out to them via [email or web address]. If 7PM-7AM, please contact night-coverage If you  still have difficulty reaching the attending provider, please page the [facility] (Director on Call) for [facility] on amion for assistance.

## 2020-10-04 LAB — CBC
HCT: 33.2 % — ABNORMAL LOW (ref 36.0–46.0)
Hemoglobin: 11 g/dL — ABNORMAL LOW (ref 12.0–15.0)
MCH: 26 pg (ref 26.0–34.0)
MCHC: 33.1 g/dL (ref 30.0–36.0)
MCV: 78.5 fL — ABNORMAL LOW (ref 80.0–100.0)
Platelets: 169 10*3/uL (ref 150–400)
RBC: 4.23 MIL/uL (ref 3.87–5.11)
RDW: 15.5 % (ref 11.5–15.5)
WBC: 8.2 10*3/uL (ref 4.0–10.5)
nRBC: 0 % (ref 0.0–0.2)

## 2020-10-04 LAB — BASIC METABOLIC PANEL
Anion gap: 9 (ref 5–15)
BUN: 14 mg/dL (ref 8–23)
CO2: 27 mmol/L (ref 22–32)
Calcium: 8.7 mg/dL — ABNORMAL LOW (ref 8.9–10.3)
Chloride: 106 mmol/L (ref 98–111)
Creatinine, Ser: 0.66 mg/dL (ref 0.44–1.00)
GFR, Estimated: >60 mL/min (ref >60)
Glucose, Bld: 141 mg/dL — ABNORMAL HIGH (ref 70–99)
Potassium: 3.3 mmol/L — ABNORMAL LOW (ref 3.5–5.1)
Sodium: 142 mmol/L (ref 135–145)

## 2020-10-04 LAB — MAGNESIUM: Magnesium: 1.7 mg/dL (ref 1.7–2.4)

## 2020-10-04 LAB — PHOSPHORUS: Phosphorus: 2.3 mg/dL — ABNORMAL LOW (ref 2.5–4.6)

## 2020-10-04 MED ORDER — POTASSIUM PHOSPHATES 15 MMOLE/5ML IV SOLN
30.0000 mmol | Freq: Once | INTRAVENOUS | Status: AC
  Administered 2020-10-04 (×2): 30 mmol via INTRAVENOUS
  Filled 2020-10-04: qty 10

## 2020-10-04 MED ORDER — MELATONIN 5 MG PO TABS
5.0000 mg | ORAL_TABLET | Freq: Every evening | ORAL | Status: DC
  Administered 2020-10-04 – 2020-10-08 (×3): 5 mg via ORAL
  Filled 2020-10-04 (×4): qty 1

## 2020-10-04 NOTE — Progress Notes (Signed)
[facility] Progress Note  Patient: Paula Luna    NID:[id number]  DOA: 10/01/2020     Date of Service: the patient was seen and examined on 10/04/2020  Chief Complaint  Patient presents with  . Emesis   Brief hospital course: Paula Luna is a 87 y.o. female with PMH of HTN, A. 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  Patient feels like she has a "knot" in her upper abdomen.  She has not had a "good" bowel movement in days.   ED w/up: Showed small bowel obstruction.  Neurosurgery consulted and patient admitted for further management as below  CT scan of abdomen and pelvis shows mechanical small bowel obstruction with fluid-filled stomach,duodenum, and proximal small bowel with collapsed distal small Bowel. Potential site of obstruction at the enteric enteric anastomosis in the mid bowel. No portal venous gas or intraperitoneal free air. No pneumatosis Intestinalis. Labs show sodium 139, potassium 3.3, chloride 97, bicarb 27, glucose 131, BUN 19, creatinine 0.83, calcium 9.5, alkaline phosphatase 63, albumin 3.5, lipase 49, AST 19, ALT 12, white count 7.8, hemoglobin 12.6, hematocrit 39.3, MCV 78.9, RDW 15, platelet count 232 COVID-19 PCR testing is pending Twelve-lead EKG is also pending at the time of this H&P   Assessment and Plan: Principal Problem:   SBO (small bowel obstruction) (HCC) Active Problems:   Essential hypertension   Atrial fibrillation (HCC)     Small bowel obstruction Patient presents for evaluation of abdominal pain, nausea, vomiting and constipation and imaging is suggestive of a mechanical obstruction at the enteric enteric anastomosis in the mid bowel. keep patient n.p.o.  Gastric decompression with NG tube IV fluid hydration Supportive  care with antiemetics and IV PPI General surgery following, continue conservative management for now General surgery commended PICC line and TPN 1/22 AXR repeated still shows small bowel obstruction.  Patient pulled out NG tube and PICC line last night due to confusion which will be reinserted 1/23 patient remained without NG tube with suction as per general surgery   History of atrial fibrillation, Chronic On long-term anticoagulation as primary prophylaxis for an acute stroke Hold Xarelto for now Administer metoprolol 5mg  IV q 8 for rate control   Essential hypertension Hold oral antihypertensive medications IV hydralazine for systolic blood pressure greater than   Hypokalemia, resolved Secondary to GI loss from nausea and vomiting Supplement potassium  Hypophosphatemia, phos repleted.  Monitor and replete as needed.   Agitation Use Haldol as needed Use mittens as needed  Body mass index is 24.24 kg/m.       Diet: NPO DVT Prophylaxis: SCD, pharmacological prophylaxis contraindicated due to Possibility of surgical intervention   Advance goals of care discussion: DNR  Family Communication: family was not present at bedside, at the time of interview.  The pt provided permission to discuss medical plan with the family. Opportunity was given to ask question and all questions were answered satisfactorily.   Disposition:  Pt is from Home, admitted with SBO, still has SBO, which precludes a safe discharge. Discharge to home versus SNF depends on recovery, when SBO will resolve and cleared by general surgery.  Subjective: Reviewed overnight events, patient remained without NG tube, no vomiting.  Patient stated that she started passing gas but no BM, no abdominal pain. Patient is more  awake and alert, AAO x2 today    Physical Exam: General:  Alert, disoriented and confused   Appear in mild distress, affect appropriate Eyes: PERRLA ENT: Oral Mucosa Clear,  moist  Neck: no JVD,  Cardiovascular: S1 and S2 Present, no Murmur,  Respiratory: good respiratory effort, Bilateral Air entry equal and Decreased, no Crackles, no wheezes Abdomen: Bowel Sound sluggish, soft and no tenderness,  Skin: no rashes Extremities: no Pedal edema, no calf tenderness Neurologic: without any new focal findings Gait not checked due to patient safety concerns  Vitals:   10/03/20 2043 10/03/20 2344 10/04/20 0500 10/04/20 1147  BP: (!) 159/69 137/67 (!) 152/63 (!) 156/92  Pulse: 76 75 67 75  Resp: (!) 24 20 18 15   Temp: (!) 97.5 F (36.4 C) 98.1 F (36.7 C) 98 F (36.7 C) 98.4 F (36.9 C)  TempSrc: Oral Oral Oral Oral  SpO2: 95% 97% 95% 96%  Weight:      Height:        Intake/Output Summary (Last 24 hours) at 10/04/2020 1336 Last data filed at 10/04/2020 0500 Gross per 24 hour  Intake 2228.49 ml  Output 600 ml  Net 1628.49 ml   Filed Weights   10/01/20 0910  Weight: 54.4 kg    Data Reviewed: I have personally reviewed and interpreted daily labs, tele strips, imagings as discussed above. I reviewed all nursing notes, pharmacy notes, vitals, pertinent old records I have discussed plan of care as described above with RN and patient/family.  CBC: Recent Labs  Lab 10/01/20 0911 10/02/20 0645 10/03/20 0453 10/04/20 0657  WBC 7.8 12.2* 9.7 8.2  HGB 12.6 11.7* 10.7* 11.0*  HCT 39.3 36.5 33.8* 33.2*  MCV 78.9* 79.5* 80.1 78.5*  PLT 232 198 210 169   Basic Metabolic Panel: Recent Labs  Lab 10/01/20 0911 10/02/20 0645 10/03/20 0453 10/04/20 0657  NA 139 144 147* 142  K 3.3* 4.1 3.9 3.3*  CL 97* 105 107 106  CO2 27 23 27 27   GLUCOSE 131* 118* 197* 141*  BUN 19 18 24* 14  CREATININE 0.83 0.80 0.79 0.66  CALCIUM 9.5 9.0 9.5 8.7*  MG  --   --  1.9 1.7  PHOS  --   --  2.4* 2.3*    Studies: No results found.  Scheduled Meds: . fluticasone  1 spray Each Nare Daily  . melatonin  5 mg Oral QPM  . metoprolol tartrate  5 mg Intravenous Q8H   . pantoprazole (PROTONIX) IV  40 mg Intravenous Q24H   Continuous Infusions: . dextrose 5 % and 0.45% NaCl 100 mL/hr at 10/04/20 0509  . potassium PHOSPHATE IVPB (in mmol) 30 mmol (10/04/20 1309)   PRN Meds: haloperidol lactate, hydrALAZINE, morphine injection, ondansetron **OR** ondansetron (ZOFRAN) IV  Time spent: 35 minutes  Author: 10/06/20. MD [facility] 10/04/2020 1:36 PM  To reach On-call, see care teams to locate the attending and reach out to them via [email or web address]. If 7PM-7AM, please contact night-coverage If you still have difficulty reaching the attending provider, please page the [facility] (Director on Call) for [facility] on amion for assistance.

## 2020-10-04 NOTE — Plan of Care (Signed)
Continuing with plan of care. 

## 2020-10-04 NOTE — Progress Notes (Signed)
Subjective:  CC: Paula Luna is a 87 y.o. female  Hospital stay day 3,   SBO  HPI: Much more coherent today, no issues overnight.  Passing flatus.  Denies any pain, N/V  ROS:  General: Denies weight loss, weight gain, fatigue, fevers, chills, and night sweats. Heart: Denies chest pain, palpitations, racing heart, irregular heartbeat, leg pain or swelling, and decreased activity tolerance. Respiratory: Denies breathing difficulty, shortness of breath, wheezing, cough, and sputum. GI: Denies change in appetite, heartburn, nausea, vomiting, constipation, diarrhea, and blood in stool. GU: Denies difficulty urinating, pain with urinating, urgency, frequency, blood in urine.   Objective:   Temp:  [97.5 F (36.4 C)-98.4 F (36.9 C)] 98.4 F (36.9 C) (01/23 1147) Pulse Rate:  [67-76] 75 (01/23 1147) Resp:  [15-24] 15 (01/23 1147) BP: (137-159)/(63-92) 156/92 (01/23 1147) SpO2:  [95 %-97 %] 96 % (01/23 1147)     Height: 4\' 11"  (149.9 cm) Weight: 54.4 kg BMI (Calculated): 24.22   Intake/Output this shift:   Intake/Output Summary (Last 24 hours) at 10/04/2020 1543 Last data filed at 10/04/2020 1512 Gross per 24 hour  Intake 2228.49 ml  Output 1000 ml  Net 1228.49 ml    Constitutional :  alert, cooperative and no distress  Respiratory:  clear to auscultation bilaterally  Cardiovascular:  regular rate and rhythm  Gastrointestinal: soft, non-tender; bowel sounds normal; no masses,  no organomegaly.   Skin: Cool and moist.   Psychiatric: Normal affect, non-agitated, not confused       LABS:  CMP Latest Ref Rng & Units 10/04/2020 10/03/2020 10/02/2020  Glucose 70 - 99 mg/dL 10/04/2020) 614(E) 315(Q)  BUN 8 - 23 mg/dL 14 008(Q) 18  Creatinine 0.44 - 1.00 mg/dL 76(P 9.50 9.32  Sodium 135 - 145 mmol/L 142 147(H) 144  Potassium 3.5 - 5.1 mmol/L 3.3(L) 3.9 4.1  Chloride 98 - 111 mmol/L 106 107 105  CO2 22 - 32 mmol/L 27 27 23   Calcium 8.9 - 10.3 mg/dL 6.71) 9.5 9.0  Total Protein 6.5 -  8.1 g/dL - - -  Total Bilirubin 0.3 - 1.2 mg/dL - - -  Alkaline Phos 38 - 126 U/L - - -  AST 15 - 41 U/L - - -  ALT 0 - 44 U/L - - -   CBC Latest Ref Rng & Units 10/04/2020 10/03/2020 10/02/2020  WBC 4.0 - 10.5 K/uL 8.2 9.7 12.2(H)  Hemoglobin 12.0 - 15.0 g/dL 11.0(L) 10.7(L) 11.7(L)  Hematocrit 36.0 - 46.0 % 33.2(L) 33.8(L) 36.5  Platelets 150 - 400 K/uL 169 210 198    RADS: n/a Assessment:   SBO Passing flatus, exam benign, no complaints today. Will resume clears and continue to monitor for now.  Episode of delirium resolved.

## 2020-10-05 ENCOUNTER — Inpatient Hospital Stay: Payer: PPO

## 2020-10-05 DIAGNOSIS — E440 Moderate protein-calorie malnutrition: Secondary | ICD-10-CM | POA: Insufficient documentation

## 2020-10-05 LAB — CBC
HCT: 36.5 % (ref 36.0–46.0)
Hemoglobin: 12.2 g/dL (ref 12.0–15.0)
MCH: 25.6 pg — ABNORMAL LOW (ref 26.0–34.0)
MCHC: 33.4 g/dL (ref 30.0–36.0)
MCV: 76.5 fL — ABNORMAL LOW (ref 80.0–100.0)
Platelets: 182 10*3/uL (ref 150–400)
RBC: 4.77 MIL/uL (ref 3.87–5.11)
RDW: 15.1 % (ref 11.5–15.5)
WBC: 5.9 10*3/uL (ref 4.0–10.5)
nRBC: 0 % (ref 0.0–0.2)

## 2020-10-05 LAB — BASIC METABOLIC PANEL
Anion gap: 9 (ref 5–15)
BUN: 9 mg/dL (ref 8–23)
CO2: 27 mmol/L (ref 22–32)
Calcium: 8.4 mg/dL — ABNORMAL LOW (ref 8.9–10.3)
Chloride: 106 mmol/L (ref 98–111)
Creatinine, Ser: 0.64 mg/dL (ref 0.44–1.00)
GFR, Estimated: >60 mL/min (ref >60)
Glucose, Bld: 138 mg/dL — ABNORMAL HIGH (ref 70–99)
Potassium: 3.3 mmol/L — ABNORMAL LOW (ref 3.5–5.1)
Sodium: 142 mmol/L (ref 135–145)

## 2020-10-05 LAB — ALBUMIN: Albumin: 2.6 g/dL — ABNORMAL LOW (ref 3.5–5.0)

## 2020-10-05 LAB — MAGNESIUM: Magnesium: 1.6 mg/dL — ABNORMAL LOW (ref 1.7–2.4)

## 2020-10-05 LAB — PHOSPHORUS: Phosphorus: 3.2 mg/dL (ref 2.5–4.6)

## 2020-10-05 MED ORDER — AMLODIPINE BESYLATE 5 MG PO TABS
5.0000 mg | ORAL_TABLET | Freq: Every day | ORAL | Status: DC
  Administered 2020-10-05 – 2020-10-09 (×4): 5 mg via ORAL
  Filled 2020-10-05 (×4): qty 1

## 2020-10-05 MED ORDER — METOPROLOL TARTRATE 5 MG/5ML IV SOLN: 5.0000 mg | Freq: Three times a day (TID) | INTRAVENOUS | Status: DC | PRN

## 2020-10-05 MED ORDER — ENSURE ENLIVE PO LIQD
237.0000 mL | Freq: Two times a day (BID) | ORAL | Status: DC
  Administered 2020-10-05 – 2020-10-09 (×10): 237 mL via ORAL

## 2020-10-05 MED ORDER — METOPROLOL SUCCINATE ER 25 MG PO TB24
25.0000 mg | ORAL_TABLET | Freq: Every day | ORAL | Status: DC
Start: 2020-10-05 — End: 2020-10-09
  Administered 2020-10-05 – 2020-10-09 (×4): 25 mg via ORAL
  Filled 2020-10-05 (×4): qty 1

## 2020-10-05 MED ORDER — POTASSIUM CHLORIDE 10 MEQ/100ML IV SOLN
10.0000 meq | INTRAVENOUS | Status: AC
  Administered 2020-10-05 (×4): 10 meq via INTRAVENOUS
  Filled 2020-10-05: qty 100

## 2020-10-05 MED ORDER — RIVAROXABAN 15 MG PO TABS
15.0000 mg | ORAL_TABLET | Freq: Every day | ORAL | Status: DC
Start: 2020-10-05 — End: 2020-10-09
  Administered 2020-10-06 – 2020-10-08 (×3): 15 mg via ORAL
  Filled 2020-10-05 (×5): qty 1

## 2020-10-05 MED ORDER — MAGNESIUM SULFATE 2 GM/50ML IV SOLN
2.0000 g | INTRAVENOUS | Status: AC
  Administered 2020-10-05 (×2): 2 g via INTRAVENOUS
  Filled 2020-10-05: qty 50

## 2020-10-05 MED ORDER — VITAMIN D 25 MCG (1000 UNIT) PO TABS
2000.0000 [IU] | ORAL_TABLET | Freq: Every day | ORAL | Status: DC
  Administered 2020-10-05 – 2020-10-09 (×5): 2000 [IU] via ORAL
  Filled 2020-10-05 (×6): qty 2

## 2020-10-05 MED ORDER — LISINOPRIL 20 MG PO TABS
40.0000 mg | ORAL_TABLET | Freq: Every day | ORAL | Status: DC
  Administered 2020-10-05 – 2020-10-09 (×4): 40 mg via ORAL
  Filled 2020-10-05 (×4): qty 2

## 2020-10-05 NOTE — Evaluation (Signed)
Occupational Therapy Evaluation Patient Details Name: Paula Luna MRN: [id number] DOB: 1924/09/14 Today's Date: 10/05/2020    History of Present Illness Per MD notes: Pt is a 87 y.o. female with medical history significant for hypertension, history of A. 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  Pt also with orders for RLE WBAT per Dr. Odis Luster with KI donned at all times except for hygiene. MD assessment includes: Small bowel obstruction, Hypokalemia, agitation, and Hypophosphatemia.   Clinical Impression   Patient presenting with decreased I in self care, balance, functional mobility/transfers, endurance, and safety awareness. Patient reports living at home with daughter and uses RW for mobility PTA. Pt reports bedroom being on the second floor and she utilizes rail and side steps to get up to room. Daughter works during the day but pt reports she will be taking off of work when she discharges from hospital. Patient currently functioning at min A overall and needing cuing for safety awareness,sequencing, and attending to tasks. Pt disoriented to during session with tangential speech throughout. No family present to determine cognitive baseline.  Patient will benefit from acute OT to increase overall independence in the areas of ADLs, functional mobility, and safety awareness in order to safely discharge home with family.    Follow Up Recommendations  Home health OT;Supervision/Assistance - 24 hour    Equipment Recommendations  3 in 1 bedside commode       Precautions / Restrictions Precautions Precautions: Fall Required Braces or Orthoses: Knee Immobilizer - Right Knee Immobilizer - Right: On at all times;Other (comment) Restrictions Weight Bearing Restrictions: Yes RLE Weight  Bearing: Weight bearing as tolerated Other Position/Activity Restrictions: No R knee ROM exercises      Mobility Bed Mobility Overal bed mobility: Needs Assistance Bed Mobility: Supine to Sit;Sit to Supine     Supine to sit: Min assist Sit to supine: Min assist   General bed mobility comments: Min A for R LE with min cuing for technique    Transfers Overall transfer level: Needs assistance Equipment used: Rolling walker (2 wheeled) Transfers: Sit to/from Stand Sit to Stand: Min guard         General transfer comment: Pt refusal    Balance Overall balance assessment: Needs assistance Sitting-balance support: Feet supported Sitting balance-Leahy Scale: Fair     Standing balance support: Bilateral upper extremity supported;During functional activity Standing balance-Leahy Scale: Fair Standing balance comment: Mod lean on the RW for support                           ADL either performed or assessed with clinical judgement   ADL Overall ADL's : Needs assistance/impaired     Grooming: Wash/dry hands;Wash/dry face;Sitting;Set up                                 General ADL Comments: Pt refusal for further OOB activities     Vision Patient Visual Report: No change from baseline              Pertinent Vitals/Pain Pain Assessment: No/denies pain     Hand Dominance Right   Extremity/Trunk Assessment Upper Extremity Assessment Upper Extremity Assessment: Generalized weakness   Lower Extremity Assessment Lower  Extremity Assessment: Generalized weakness;RLE deficits/detail RLE Deficits / Details: RLE hip flex strength >/= 3/5 RLE: Unable to fully assess due to immobilization       Communication Communication Communication: No difficulties   Cognition Arousal/Alertness: Awake/alert Behavior During Therapy: WFL for tasks assessed/performed Overall Cognitive Status: No family/caregiver present to determine baseline cognitive  functioning                                 General Comments: Pt with tangential speech , reports incorrect DOB, and needing min - mod redirection to attend to tasks.      Exercises Total Joint Exercises Ankle Circles/Pumps: AROM;Strengthening;Both;5 reps;10 reps Heel Slides: AROM;Strengthening;Left;10 reps Hip ABduction/ADduction: Strengthening;Both;10 reps;5 reps Straight Leg Raises: Strengthening;Both;10 reps;5 reps Long Arc Quad: AROM;Strengthening;Left;10 reps Other Exercises Other Exercises: HEP education for BLE APs ad lib and BLE SLR and hip Abd/add x 10 each 3x/day   Shoulder Instructions      Home Living Family/patient expects to be discharged to:: Private residence Living Arrangements: Children Available Help at Discharge: Family;Friend(s);Available PRN/intermittently Type of Home: House Home Access: Ramped entrance     Home Layout: Two level;Able to live on main level with bedroom/bathroom               Home Equipment: Gilmer Mor - single point;Walker - 4 wheels   Additional Comments: Pt lives with daughter and has a friend who stays with her daily while daughter is at work but only for 1-2 hours      Prior Functioning/Environment Level of Independence: Independent        Comments: Pt Ind with amb community distances without an AD until fall in bathroom "around a month ago" requiring KI with pt then ambulating limited community distances with a rollator, Ind with ADLs, no other fall history        OT Problem List: Decreased strength;Decreased activity tolerance;Impaired balance (sitting and/or standing);Decreased safety awareness;Pain;Decreased range of motion;Decreased knowledge of use of DME or AE      OT Treatment/Interventions: Self-care/ADL training;Therapeutic exercise;Energy conservation;DME and/or AE instruction;Cognitive remediation/compensation;Therapeutic activities;Balance training;Patient/family education;Manual therapy    OT  Goals(Current goals can be found in the care plan section) Acute Rehab OT Goals Patient Stated Goal: To return home OT Goal Formulation: With patient Time For Goal Achievement: 10/19/20 Potential to Achieve Goals: Fair ADL Goals Pt Will Perform Grooming: (P) with modified independence;standing Pt Will Transfer to Toilet: (P) ambulating;with supervision Pt Will Perform Toileting - Clothing Manipulation and hygiene: (P) sit to/from stand;with supervision Pt Will Perform Tub/Shower Transfer: (P) ambulating;with supervision Pt/caregiver will Perform Home Exercise Program: (P) Both right and left upper extremity;Increased strength;With theraband;With Supervision;With written HEP provided  OT Frequency: Min 2X/week   Barriers to D/C:    none known at this time          AM-PAC OT "6 Clicks" Daily Activity     Outcome Measure Help from another person eating meals?: A Little Help from another person taking care of personal grooming?: A Little Help from another person toileting, which includes using toliet, bedpan, or urinal?: A Lot Help from another person bathing (including washing, rinsing, drying)?: A Little Help from another person to put on and taking off regular upper body clothing?: A Little Help from another person to put on and taking off regular lower body clothing?: A Lot 6 Click Score: 16   End of Session Nurse Communication: Mobility status;Precautions  Activity Tolerance:  Patient limited by fatigue Patient left: in bed;with call bell/phone within reach;with bed alarm set  OT Visit Diagnosis: Unsteadiness on feet (R26.81);Muscle weakness (generalized) (M62.81)                Time: 1440-1510 OT Time Calculation (min): 30 min Charges:  OT General Charges $OT Visit: 1 Visit OT Evaluation $OT Eval Low Complexity: 1 Low OT Treatments $Self Care/Home Management : 8-22 mins  Jackquline Denmark, MS, OTR/L , CBIS ascom [phone]  10/05/20, 4:00 PM

## 2020-10-05 NOTE — Care Management Important Message (Signed)
Important Message  Patient Details  Name: Paula Luna MRN: [id number] Date of Birth: February 25, 1925   Medicare Important Message Given:  Yes     Johnell Comings 10/05/2020, 12:19 PM

## 2020-10-05 NOTE — Plan of Care (Signed)
  Problem: Education: Goal: Knowledge of General Education information will improve Description: Including pain rating scale, medication(s)/side effects and non-pharmacologic comfort measures 10/05/2020 1328 by Ansel Bong, RN Outcome: Progressing 10/05/2020 1328 by Ansel Bong, RN Outcome: Progressing   Problem: Health Behavior/Discharge Planning: Goal: Ability to manage health-related needs will improve 10/05/2020 1328 by Ansel Bong, RN Outcome: Progressing 10/05/2020 1328 by Ansel Bong, RN Outcome: Progressing   Problem: Clinical Measurements: Goal: Ability to maintain clinical measurements within normal limits will improve 10/05/2020 1328 by Ansel Bong, RN Outcome: Progressing 10/05/2020 1328 by Ansel Bong, RN Outcome: Progressing Goal: Will remain free from infection 10/05/2020 1328 by Ansel Bong, RN Outcome: Progressing 10/05/2020 1328 by Ansel Bong, RN Outcome: Progressing Goal: Diagnostic test results will improve 10/05/2020 1328 by Ansel Bong, RN Outcome: Progressing 10/05/2020 1328 by Ansel Bong, RN Outcome: Progressing Goal: Respiratory complications will improve 10/05/2020 1328 by Ansel Bong, RN Outcome: Progressing 10/05/2020 1328 by Ansel Bong, RN Outcome: Progressing Goal: Cardiovascular complication will be avoided 10/05/2020 1328 by Ansel Bong, RN Outcome: Progressing 10/05/2020 1328 by Ansel Bong, RN Outcome: Progressing   Problem: Activity: Goal: Risk for activity intolerance will decrease 10/05/2020 1328 by Ansel Bong, RN Outcome: Progressing 10/05/2020 1328 by Ansel Bong, RN Outcome: Progressing   Problem: Nutrition: Goal: Adequate nutrition will be maintained 10/05/2020 1328 by Ansel Bong, RN Outcome: Progressing 10/05/2020 1328 by Ansel Bong, RN Outcome: Progressing   Problem: Coping: Goal: Level of anxiety will decrease 10/05/2020 1328 by Ansel Bong, RN Outcome:  Progressing 10/05/2020 1328 by Ansel Bong, RN Outcome: Progressing   Problem: Elimination: Goal: Will not experience complications related to bowel motility 10/05/2020 1328 by Ansel Bong, RN Outcome: Progressing 10/05/2020 1328 by Ansel Bong, RN Outcome: Progressing Goal: Will not experience complications related to urinary retention 10/05/2020 1328 by Ansel Bong, RN Outcome: Progressing 10/05/2020 1328 by Ansel Bong, RN Outcome: Progressing   Problem: Pain Managment: Goal: General experience of comfort will improve 10/05/2020 1328 by Ansel Bong, RN Outcome: Progressing 10/05/2020 1328 by Ansel Bong, RN Outcome: Progressing   Problem: Safety: Goal: Ability to remain free from injury will improve 10/05/2020 1328 by Ansel Bong, RN Outcome: Progressing 10/05/2020 1328 by Ansel Bong, RN Outcome: Progressing   Problem: Skin Integrity: Goal: Risk for impaired skin integrity will decrease 10/05/2020 1328 by Ansel Bong, RN Outcome: Progressing 10/05/2020 1328 by Ansel Bong, RN Outcome: Progressing

## 2020-10-05 NOTE — Progress Notes (Signed)
[facility] Progress Note  Patient: Paula Luna    XBJ:[id number]  DOA: 10/01/2020     Date of Service: the patient was seen and examined on 10/05/2020  Chief Complaint  Patient presents with  . Emesis   Brief hospital course: Paula Luna is a 87 y.o. female with PMH of HTN, A. 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  Patient feels like she has a "knot" in her upper abdomen.  She has not had a "good" bowel movement in days.   ED w/up: Showed small bowel obstruction.  Neurosurgery consulted and patient admitted for further management as below  CT scan of abdomen and pelvis shows mechanical small bowel obstruction with fluid-filled stomach,duodenum, and proximal small bowel with collapsed distal small Bowel. Potential site of obstruction at the enteric enteric anastomosis in the mid bowel. No portal venous gas or intraperitoneal free air. No pneumatosis Intestinalis. Labs show sodium 139, potassium 3.3, chloride 97, bicarb 27, glucose 131, BUN 19, creatinine 0.83, calcium 9.5, alkaline phosphatase 63, albumin 3.5, lipase 49, AST 19, ALT 12, white count 7.8, hemoglobin 12.6, hematocrit 39.3, MCV 78.9, RDW 15, platelet count 232 COVID-19 PCR testing is pending Twelve-lead EKG is also pending at the time of this H&P   Assessment and Plan: Principal Problem:   SBO (small bowel obstruction) (HCC) Active Problems:   Essential hypertension   Atrial fibrillation (HCC)     Small bowel obstruction, resolved  Patient presents for evaluation of abdominal pain, nausea, vomiting and constipation and imaging is suggestive of a mechanical obstruction at the enteric enteric anastomosis in the mid bowel. S/p Gastric decompression with NG tube, patient pulled out NG tube IV fluid  hydration Supportive care with antiemetics and IV PPI General surgery following, continue conservative management for now General surgery commended PICC line and TPN but patient removed PICC line 1/22 AXR repeated still shows small bowel obstruction.  Patient pulled out NG tube and PICC line last night due to confusion which will be reinserted 1/23 patient started passing flatus, clear liquid diet was started and patient tolerated well. 1/24 diet advance to full liquid, follow diet tolerance and advance as per general surgery, may discharge tomorrow if cleared   History of atrial fibrillation, Chronic On long-term anticoagulation as primary prophylaxis for an acute stroke 1/24 resumed Xarelto and Lopressor Use  metoprolol 5mg  IV prn    Essential hypertension 1/24 resumed oral antihypertensive medications IV hydralazine for systolic blood pressure greater than 2/24   Hypokalemia, resolved Secondary to GI loss from nausea and vomiting Supplement potassium  Hypophosphatemia, phos repleted.  Monitor and replete as needed.   Agitation, resolved Use Haldol as needed Use mittens as needed   H/o right patellar fracture, s/p cast Texted to orthopedic surgery, recommended weightbearing as tolerated in the cast and follow-up with Dr. as an outpatient Knee x-ray: Signs of patellar fracture with some increased sclerosis along the margins of the fracture, compatible with subacute fracture. No change in 2-3 mm of distraction. Follow PT/OT eval before discharge   Body mass index is 24.24 kg/m.       Diet: NPO DVT Prophylaxis: SCD, pharmacological prophylaxis contraindicated due to Possibility of surgical intervention   Advance goals of care discussion: DNR  Family Communication: family was present at bedside, at the time  of interview.  The pt provided permission to discuss medical plan with the family. Opportunity was given to ask question and all questions were  answered satisfactorily.   Disposition:  Pt is from Home, admitted with SBO, still has SBO, which precludes a safe discharge. Discharge to home vs SNF depends on recovery, SBO resolved, follow diet tolerance and clearance by surgery, discharged in 1 to 2 days.    Subjective: No any significant events overnight, patient is passing gas but no BM yet.  Tolerated clear liquid diet.  Advance diet to full liquid by general surgery.  Follow diet tolerance and bowel movements Follow general surgery for clearance Right patella fracture, continue cast and weightbearing as tolerated Follow-up with orthopedics as an outpatient Pt needs PT and OT eval before discharge    Physical Exam: General:  Alert, disoriented and confused   Appear in mild distress, affect appropriate Eyes: PERRLA ENT: Oral Mucosa Clear, moist  Neck: no JVD,  Cardiovascular: S1 and S2 Present, no Murmur,  Respiratory: good respiratory effort, Bilateral Air entry equal and Decreased, no Crackles, no wheezes Abdomen: Bowel Sound sluggish, soft and no tenderness,  Skin: no rashes Extremities: no Pedal edema, no calf tenderness, right patella fracture, cast intact Neurologic: without any new focal findings Gait not checked due to patient safety concerns  Vitals:   10/04/20 2056 10/04/20 2223 10/05/20 0531 10/05/20 1213  BP: (!) 156/87 (!) 154/74 (!) 149/93 (!) 171/70  Pulse: 73 78 85 72  Resp: 16 16 16 16   Temp: 98.1 F (36.7 C) 98 F (36.7 C) 97.6 F (36.4 C) 97.6 F (36.4 C)  TempSrc: Oral Oral Oral Oral  SpO2: 95% 94% 97% 93%  Weight:      Height:        Intake/Output Summary (Last 24 hours) at 10/05/2020 1453 Last data filed at 10/05/2020 1215 Gross per 24 hour  Intake 1440.83 ml  Output 1400 ml  Net 40.83 ml   Filed Weights   10/01/20 0910  Weight: 54.4 kg    Data Reviewed: I have personally reviewed and interpreted daily labs, tele strips, imagings as discussed above. I reviewed all nursing notes,  pharmacy notes, vitals, pertinent old records I have discussed plan of care as described above with RN and patient/family.  CBC: Recent Labs  Lab 10/01/20 0911 10/02/20 0645 10/03/20 0453 10/04/20 0657 10/05/20 0554  WBC 7.8 12.2* 9.7 8.2 5.9  HGB 12.6 11.7* 10.7* 11.0* 12.2  HCT 39.3 36.5 33.8* 33.2* 36.5  MCV 78.9* 79.5* 80.1 78.5* 76.5*  PLT 232 198 210 169 182   Basic Metabolic Panel: Recent Labs  Lab 10/01/20 0911 10/02/20 0645 10/03/20 0453 10/04/20 0657 10/05/20 0554  NA 139 144 147* 142 142  K 3.3* 4.1 3.9 3.3* 3.3*  CL 97* 105 107 106 106  CO2 27 23 27 27 27   GLUCOSE 131* 118* 197* 141* 138*  BUN 19 18 24* 14 9  CREATININE 0.83 0.80 0.79 0.66 0.64  CALCIUM 9.5 9.0 9.5 8.7* 8.4*  MG  --   --  1.9 1.7 1.6*  PHOS  --   --  2.4* 2.3* 3.2    Studies: DG Knee Complete 4 Views Right  Result Date: 10/05/2020 CLINICAL DATA:  Patellar fracture by report 3 weeks ago. EXAM: RIGHT KNEE - COMPLETE 4+ VIEW COMPARISON:  August 26, 2020 FINDINGS: Signs of patellar fracture with some increased sclerosis along the margins of the fracture. Alignment in terms of distraction is similar to the prior  exam. Signs of degenerative change in osteopenia about the knee with similar appearance. No joint effusion IMPRESSION: Signs of patellar fracture with some increased sclerosis along the margins of the fracture, compatible with subacute fracture. No change in 2-3 mm of distraction. Electronically Signed   By: Donzetta Kohut M.D.   On: 10/05/2020 11:56   DG Abd 2 Views  Result Date: 10/05/2020 CLINICAL DATA:  History of ischemic bowel disease resection for years ago. Abdominal pain, mostly on the right EXAM: ABDOMEN - 2 VIEW COMPARISON:  Two days ago FINDINGS: Favorable increase in colonic gas, especially in the ascending colon. The degree of gas distended small bowel loops on the left is not progressed and the largest loops are seen about bowel sutures and may be postoperative. No  concerning mass effect or gas collection. Airspace disease at the bases, known. IMPRESSION: Favorable increase in colonic gas. No progression of small bowel distension. Electronically Signed   By: Marnee Spring M.D.   On: 10/05/2020 07:45    Scheduled Meds: . feeding supplement  237 mL Oral BID BM  . fluticasone  1 spray Each Nare Daily  . melatonin  5 mg Oral QPM  . metoprolol tartrate  5 mg Intravenous Q8H  . pantoprazole (PROTONIX) IV  40 mg Intravenous Q24H   Continuous Infusions: . dextrose 5 % and 0.45% NaCl 100 mL/hr at 10/05/20 0734   PRN Meds: haloperidol lactate, hydrALAZINE, morphine injection, ondansetron **OR** ondansetron (ZOFRAN) IV  Time spent: 35 minutes  Author: Gillis Santa. MD [facility] 10/05/2020 2:53 PM  To reach On-call, see care teams to locate the attending and reach out to them via [email or web address]. If 7PM-7AM, please contact night-coverage If you still have difficulty reaching the attending provider, please page the [facility] (Director on Call) for [facility] on amion for assistance.

## 2020-10-05 NOTE — Evaluation (Signed)
Physical Therapy Evaluation Patient Details Name: Paula Luna MRN: [id number] DOB: 06/17/1925 Today's Date: 10/05/2020   History of Present Illness  Per MD notes: Pt is a 87 y.o. female with medical history significant for hypertension, history of A. 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  Pt also with orders for RLE WBAT per Dr. Odis Luster with KI donned at all times except for hygiene. MD assessment includes: Small bowel obstruction, Hypokalemia, agitation, and Hypophosphatemia.    Clinical Impression  Pt was pleasant and motivated to participate during the session.  Pt put forth good effort during the session and only required occasional min A during bed mobility training but did not require physical assist with transfers or ambulation.  Pt was able to amb >100 feet with slow cadence but was steady with mod lean on the RW for support.  Pt stated that she does not own a RW and instead uses a rollator at home but would benefit from a RW secondary to the increased need for physical support through her BUEs.  Pt will benefit from continued HHPT services upon discharge to safely address deficits listed in patient problem list for decreased caregiver assistance and eventual return to PLOF.      Follow Up Recommendations Home health PT;Supervision for mobility/OOB (Pt reports is currently receiving HHPT)    Equipment Recommendations  Rolling walker with 5" wheels (Need to confirm with familiy if pt has one already or not)    Recommendations for Other Services       Precautions / Restrictions Precautions Precautions: Fall Required Braces or Orthoses: Knee Immobilizer - Right Knee Immobilizer - Right: On at all times;Other (comment) (except for hygiene) Restrictions Weight Bearing  Restrictions: Yes RLE Weight Bearing: Weight bearing as tolerated Other Position/Activity Restrictions: No R knee ROM exercises      Mobility  Bed Mobility Overal bed mobility: Needs Assistance Bed Mobility: Supine to Sit;Sit to Supine     Supine to sit: Min assist Sit to supine: Min assist   General bed mobility comments: Min A for RLE and trunk control    Transfers Overall transfer level: Needs assistance Equipment used: Rolling walker (2 wheeled) Transfers: Sit to/from Stand Sit to Stand: Min guard         General transfer comment: Min verbal cues for hand and RLE positioning  Ambulation/Gait Ambulation/Gait assistance: Min guard Gait Distance (Feet): 125 Feet Assistive device: Rolling walker (2 wheeled) Gait Pattern/deviations: Step-through pattern;Decreased step length - right;Decreased step length - left Gait velocity: decreased   General Gait Details: Mod verbal cues for amb closer to the RW with more upright posture and for general safety during turns  Stairs            Wheelchair Mobility    Modified Rankin (Stroke Patients Only)       Balance Overall balance assessment: Needs assistance   Sitting balance-Leahy Scale: Fair     Standing balance support: Bilateral upper extremity supported;During functional activity Standing balance-Leahy Scale: Fair Standing balance comment: Mod lean on the RW for support                             Pertinent Vitals/Pain Pain Assessment: No/denies pain    Home Living Family/patient expects to be  discharged to:: Private residence Living Arrangements: Children Available Help at Discharge: Family;Friend(s);Available PRN/intermittently Type of Home: House Home Access: Ramped entrance     Home Layout: Two level;Able to live on main level with bedroom/bathroom Home Equipment: Gilmer Mor - single point;Walker - 4 wheels Additional Comments: Pt lives with daughter and has a friend who stays with her  daily while daughter is at work but only for 1-2 hours    Prior Function Level of Independence: Independent         Comments: Pt Ind with amb community distances without an AD until fall in bathroom "around a month ago" requiring KI with pt then ambulating limited community distances with a rollator, Ind with ADLs, no other fall history     Hand Dominance        Extremity/Trunk Assessment   Upper Extremity Assessment Upper Extremity Assessment: Generalized weakness    Lower Extremity Assessment Lower Extremity Assessment: Generalized weakness;RLE deficits/detail RLE Deficits / Details: RLE hip flex strength >/= 3/5 RLE: Unable to fully assess due to immobilization       Communication   Communication: No difficulties  Cognition Arousal/Alertness: Awake/alert Behavior During Therapy: WFL for tasks assessed/performed Overall Cognitive Status: No family/caregiver present to determine baseline cognitive functioning                                 General Comments: Pt had difficulty at times providing detailed history but followed commands well and conversation was appropriate to topic      General Comments      Exercises Total Joint Exercises Ankle Circles/Pumps: AROM;Strengthening;Both;5 reps;10 reps Heel Slides: AROM;Strengthening;Left;10 reps Hip ABduction/ADduction: Strengthening;Both;10 reps;5 reps Straight Leg Raises: Strengthening;Both;10 reps;5 reps Long Arc Quad: AROM;Strengthening;Left;10 reps Other Exercises Other Exercises: HEP education for BLE APs ad lib and BLE SLR and hip Abd/add x 10 each 3x/day   Assessment/Plan    PT Assessment Patient needs continued PT services  PT Problem List Decreased strength;Decreased activity tolerance;Decreased balance;Decreased mobility;Decreased knowledge of use of DME       PT Treatment Interventions DME instruction;Balance training;Gait training;Functional mobility training;Therapeutic  activities;Therapeutic exercise;Patient/family education    PT Goals (Current goals can be found in the Care Plan section)  Acute Rehab PT Goals Patient Stated Goal: To return home PT Goal Formulation: With patient Time For Goal Achievement: 10/18/20 Potential to Achieve Goals: Good    Frequency Min 2X/week   Barriers to discharge        Co-evaluation               AM-PAC PT "6 Clicks" Mobility  Outcome Measure Help needed turning from your back to your side while in a flat bed without using bedrails?: A Little Help needed moving from lying on your back to sitting on the side of a flat bed without using bedrails?: A Little Help needed moving to and from a bed to a chair (including a wheelchair)?: A Little Help needed standing up from a chair using your arms (e.g., wheelchair or bedside chair)?: A Little Help needed to walk in hospital room?: A Little Help needed climbing 3-5 steps with a railing? : A Little 6 Click Score: 18    End of Session Equipment Utilized During Treatment: Gait belt;Right knee immobilizer Activity Tolerance: Patient tolerated treatment well Patient left: in bed;with call bell/phone within reach;with bed alarm set Nurse Communication: Mobility status PT Visit Diagnosis: History of falling (Z91.81);Difficulty in walking, not  elsewhere classified (R26.2);Muscle weakness (generalized) (M62.81)    Time: 2542-7062 PT Time Calculation (min) (ACUTE ONLY): 49 min   Charges:   PT Evaluation $PT Eval Moderate Complexity: 1 Mod PT Treatments $Gait Training: 8-22 mins $Therapeutic Exercise: 8-22 mins        D. Scott Dorean Daniello PT, DPT 10/05/20, 3:03 PM

## 2020-10-05 NOTE — Progress Notes (Addendum)
[facility] SURGICAL PROGRESS NOTE (cpt [phone])  Hospital Day(s): 4.   Interval History: Patient seen and examined, no acute events or new complaints overnight. Patient reports she is feeling better this morning. She denies any fever, chills, nausea, emesis, abdominal pain, nor distension. CBC remains reassuring without leukocytosis. Renal function normal with sCr 0.64 and good UO. Mild hypokalemia to 3.3 and mild hypomagnesemia to 1.6. She was advanced to CLD yesterday (01/23) without issues and is tolerating well. PICC could not be placed over the weekend secondary to delirium, which is resolved now. She continues to pass flatus.   Review of Systems:  Constitutional: denies fever, chills  HEENT: denies cough or congestion  Respiratory: denies any shortness of breath  Cardiovascular: denies chest pain or palpitations  Gastrointestinal: denies abdominal pain, N/V, or diarrhea/and bowel function as per interval history Genitourinary: denies burning with urination or urinary frequency   Vital signs in last 24 hours: [min-max] current  Temp:  [97.6 F (36.4 C)-98.4 F (36.9 C)] 97.6 F (36.4 C) (01/24 0531) Pulse Rate:  [72-85] 85 (01/24 0531) Resp:  [15-16] 16 (01/24 0531) BP: (137-156)/(59-93) 149/93 (01/24 0531) SpO2:  [94 %-97 %] 97 % (01/24 0531)     Height: 4\' 11"  (149.9 cm) Weight: 54.4 kg BMI (Calculated): 24.22   Intake/Output last 2 shifts:  01/23 0701 - 01/24 0700 In: 1440.8 [I.V.:1150.3; IV Piggyback:290.5] Out: 750 [Urine:750]   Physical Exam:  Constitutional: alert, cooperative and no distress  HENT: normocephalic without obvious abnormality  Eyes: PERRL, EOM's grossly intact and symmetric  Respiratory: breathing non-labored at rest  Cardiovascular: regular rate and sinus rhythm  Gastrointestinal: soft, non-tender, and non-distended, no rebound/guarding  Labs:  CBC Latest Ref Rng & Units 10/05/2020 10/04/2020 10/03/2020  WBC 4.0 - 10.5 K/uL 5.9 8.2 9.7   Hemoglobin 12.0 - 15.0 g/dL 10/05/2020 11.0(L) 10.7(L)  Hematocrit 36.0 - 46.0 % 36.5 33.2(L) 33.8(L)  Platelets 150 - 400 K/uL 182 169 210   CMP Latest Ref Rng & Units 10/05/2020 10/04/2020 10/03/2020  Glucose 70 - 99 mg/dL 10/05/2020) 292(K) 462(M)  BUN 8 - 23 mg/dL 9 14 638(T)  Creatinine 0.44 - 1.00 mg/dL 77(N 1.65 7.90  Sodium 135 - 145 mmol/L 142 142 147(H)  Potassium 3.5 - 5.1 mmol/L 3.3(L) 3.3(L) 3.9  Chloride 98 - 111 mmol/L 106 106 107  CO2 22 - 32 mmol/L 27 27 27   Calcium 8.9 - 10.3 mg/dL 3.83) ) 9.5  Total Protein 6.5 - 8.1 g/dL - - -  Total Bilirubin 0.3 - 1.2 mg/dL - - -  Alkaline Phos 38 - 126 U/L - - -  AST 15 - 41 U/L - - -  ALT 0 - 44 U/L - - -     Imaging studies:   KUB (10/05/2020) personally reviewed which shows a small loop of dilated small bowel in the LUQ but overall improved from initial KUB and there is air throughout entire colon on exam today, and radiologist report reviewed below:  IMPRESSION: Favorable increase in colonic gas. No progression of small bowel distension.   Assessment/Plan: (ICD-10's: K23.609) 87 y.o. female with return of bowel function admitted with, now resolving, small bowel obstruction most likely secondary secondary to post-surgical adhesive disease.   - Will advance to full liquid diet this morning + nutritional supplementation; ADAT as long there is continued bowel function  - I think we can hold off on PICC / TPN for now as she is improving clinically  - Wean from IVF  resuscitation  - Monitor electrolytes; replete as needed   - Monitor abdominal examination; on-going bowel function  - Pain control prn (minimize narcotics); antiemetics prn  - Mobilize as feasible   - No need for surgical intervention   - Further management per primary service; we will follow    - Discharge Planning; Pending advancement of diet, potentially ready for DC in next 24 - 48 hours    All of the above findings and recommendations were discussed with the  patient, and the medical team, and all of patient's questions were answered to her expressed satisfaction.  -- Lynden Oxford, PA-C [facility] 10/05/2020, 7:21 AM [phone] M-F: 7am - 4pm

## 2020-10-06 ENCOUNTER — Telehealth: Payer: Self-pay | Admitting: Family Medicine

## 2020-10-06 LAB — CBC
HCT: 40.2 % (ref 36.0–46.0)
Hemoglobin: 13.1 g/dL (ref 12.0–15.0)
MCH: 25.2 pg — ABNORMAL LOW (ref 26.0–34.0)
MCHC: 32.6 g/dL (ref 30.0–36.0)
MCV: 77.5 fL — ABNORMAL LOW (ref 80.0–100.0)
Platelets: 206 10*3/uL (ref 150–400)
RBC: 5.19 MIL/uL — ABNORMAL HIGH (ref 3.87–5.11)
RDW: 14.9 % (ref 11.5–15.5)
WBC: 6.4 10*3/uL (ref 4.0–10.5)
nRBC: 0 % (ref 0.0–0.2)

## 2020-10-06 LAB — BASIC METABOLIC PANEL
Anion gap: 9 (ref 5–15)
BUN: 8 mg/dL (ref 8–23)
CO2: 25 mmol/L (ref 22–32)
Calcium: 8.6 mg/dL — ABNORMAL LOW (ref 8.9–10.3)
Chloride: 107 mmol/L (ref 98–111)
Creatinine, Ser: 0.52 mg/dL (ref 0.44–1.00)
GFR, Estimated: >60 mL/min (ref >60)
Glucose, Bld: 113 mg/dL — ABNORMAL HIGH (ref 70–99)
Potassium: 3.5 mmol/L (ref 3.5–5.1)
Sodium: 141 mmol/L (ref 135–145)

## 2020-10-06 LAB — MAGNESIUM: Magnesium: 2.3 mg/dL (ref 1.7–2.4)

## 2020-10-06 LAB — PHOSPHORUS: Phosphorus: 2.7 mg/dL (ref 2.5–4.6)

## 2020-10-06 NOTE — Progress Notes (Signed)
Occupational Therapy Treatment Patient Details Name: Paula Luna MRN: [id number] DOB: 1924/09/20 Today's Date: 10/06/2020    History of present illness Per MD notes: Pt is a 87 y.o. female with medical history significant for hypertension, history of A. 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  Pt also with orders for RLE WBAT per Dr. Odis Luster with KI donned at all times except for hygiene. MD assessment includes: Small bowel obstruction, Hypokalemia, agitation, and Hypophosphatemia.   OT comments  Upon entering the room, pt supine in bed and reports need for toileting. Pt continues to have tangential speech and is oriented to self only this session. Pt asking therapist to "go upstairs so I can get my bra on" and needing to be reoriented throughout session. Pt needing mod A for trunk and R LE to EOB. Pt standing with mod lifting assistance. Pt with very slow step to pattern into bathroom with RW and needing min A with assistance to manage RW at times as well. Pt seated on commode and toileting with mod A for clothing management and balance with hygiene before returning back to bed. Pt very fatigued at this point. Mod A to return to bed. OT adjusting brace for proper placement. OT notified RN of change for follow up recommendations based on session. All needs within reach and pt continues to benefit from OT intervention.   Follow Up Recommendations  SNF    Equipment Recommendations  Other (comment) (defer to next venue of care)       Precautions / Restrictions Precautions Precautions: Fall Required Braces or Orthoses: Knee Immobilizer - Right Knee Immobilizer - Right: On at all times;Other (comment) Restrictions Weight Bearing Restrictions: Yes RLE Weight Bearing: Weight bearing as  tolerated Other Position/Activity Restrictions: No R knee ROM exercises       Mobility Bed Mobility Overal bed mobility: Needs Assistance Bed Mobility: Supine to Sit;Sit to Supine     Supine to sit: Mod assist Sit to supine: Mod assist   General bed mobility comments: assist for R LE and trunk with min cuing for technique  Transfers Overall transfer level: Needs assistance Equipment used: Rolling walker (2 wheeled) Transfers: Sit to/from Stand Sit to Stand: Mod assist         General transfer comment: mod lifting assistance    Balance Overall balance assessment: Needs assistance Sitting-balance support: Feet supported Sitting balance-Leahy Scale: Fair     Standing balance support: Bilateral upper extremity supported;During functional activity Standing balance-Leahy Scale: Poor Standing balance comment: Mod lean on the RW for support                           ADL either performed or assessed with clinical judgement   ADL Overall ADL's : Needs assistance/impaired                         Toilet Transfer: Minimal assistance   Toileting- Clothing Manipulation and Hygiene: Moderate assistance Toileting - Clothing Manipulation Details (indicate cue type and reason): for balance with hygiene and clothing management             Vision Patient Visual Report: No change from baseline            Cognition Arousal/Alertness: Awake/alert  Behavior During Therapy: WFL for tasks assessed/performed Overall Cognitive Status: No family/caregiver present to determine baseline cognitive functioning                                 General Comments: Pt is oriented to self only this session. OT continues to reorient throughout. Mod multimodal cuing for safety with use of RW                   Pertinent Vitals/ Pain       Pain Assessment: No/denies pain  Home Living Family/patient expects to be discharged to:: Private  residence Living Arrangements: Children Available Help at Discharge: Family;Friend(s);Available PRN/intermittently Type of Home: House Home Access: Ramped entrance     Home Layout: Two level;Able to live on main level with bedroom/bathroom                              Frequency  Min 1X/week        Progress Toward Goals  OT Goals(current goals can now be found in the care plan section)  Progress towards OT goals: Progressing toward goals  Acute Rehab OT Goals Patient Stated Goal: "to go upstairs" OT Goal Formulation: With patient Time For Goal Achievement: 10/19/20 Potential to Achieve Goals: Fair  Plan Discharge plan needs to be updated       AM-PAC OT "6 Clicks" Daily Activity     Outcome Measure   Help from another person eating meals?: A Little Help from another person taking care of personal grooming?: A Little Help from another person toileting, which includes using toliet, bedpan, or urinal?: A Lot Help from another person bathing (including washing, rinsing, drying)?: A Little Help from another person to put on and taking off regular upper body clothing?: A Little Help from another person to put on and taking off regular lower body clothing?: A Lot 6 Click Score: 16    End of Session    OT Visit Diagnosis: Unsteadiness on feet (R26.81);Muscle weakness (generalized) (M62.81)   Activity Tolerance Patient limited by fatigue   Patient Left in bed;with call bell/phone within reach;with bed alarm set   Nurse Communication Mobility status;Precautions        Time: 8315-1761 OT Time Calculation (min): 33 min  Charges: OT General Charges $OT Visit: 1 Visit OT Treatments $Self Care/Home Management : 23-37 mins  Jackquline Denmark, MS, OTR/L , CBIS ascom [phone]  10/06/20, 4:02 PM

## 2020-10-06 NOTE — Telephone Encounter (Signed)
Daughter Paula Luna) called in and stated that mrs Paula Luna is in the hospital and she has a bowel blockage and broken kneecap and  being in the hospital has her disoriented, while in the hospital she snatched out the pcn line and nasal line, daughter wanted to know if Paula Luna can look at her x-ray . And she needs help with someone coming to her home.  And she not sure if she going to rehab or going home.

## 2020-10-06 NOTE — Plan of Care (Signed)
  Problem: Education: Goal: Knowledge of General Education information will improve Description: Including pain rating scale, medication(s)/side effects and non-pharmacologic comfort measures 10/06/2020 1446 by Ansel Bong, RN Outcome: Progressing 10/06/2020 1446 by Ansel Bong, RN Outcome: Progressing   Problem: Health Behavior/Discharge Planning: Goal: Ability to manage health-related needs will improve 10/06/2020 1446 by Ansel Bong, RN Outcome: Progressing 10/06/2020 1446 by Ansel Bong, RN Outcome: Progressing   Problem: Clinical Measurements: Goal: Ability to maintain clinical measurements within normal limits will improve 10/06/2020 1446 by Ansel Bong, RN Outcome: Progressing 10/06/2020 1446 by Ansel Bong, RN Outcome: Progressing Goal: Will remain free from infection 10/06/2020 1446 by Ansel Bong, RN Outcome: Progressing 10/06/2020 1446 by Ansel Bong, RN Outcome: Progressing Goal: Diagnostic test results will improve 10/06/2020 1446 by Ansel Bong, RN Outcome: Progressing 10/06/2020 1446 by Ansel Bong, RN Outcome: Progressing Goal: Respiratory complications will improve 10/06/2020 1446 by Ansel Bong, RN Outcome: Progressing 10/06/2020 1446 by Ansel Bong, RN Outcome: Progressing Goal: Cardiovascular complication will be avoided 10/06/2020 1446 by Ansel Bong, RN Outcome: Progressing 10/06/2020 1446 by Ansel Bong, RN Outcome: Progressing   Problem: Activity: Goal: Risk for activity intolerance will decrease 10/06/2020 1446 by Ansel Bong, RN Outcome: Progressing 10/06/2020 1446 by Ansel Bong, RN Outcome: Progressing   Problem: Nutrition: Goal: Adequate nutrition will be maintained 10/06/2020 1446 by Ansel Bong, RN Outcome: Progressing 10/06/2020 1446 by Ansel Bong, RN Outcome: Progressing   Problem: Coping: Goal: Level of anxiety will decrease 10/06/2020 1446 by Ansel Bong, RN Outcome:  Progressing 10/06/2020 1446 by Ansel Bong, RN Outcome: Progressing   Problem: Elimination: Goal: Will not experience complications related to bowel motility 10/06/2020 1446 by Ansel Bong, RN Outcome: Progressing 10/06/2020 1446 by Ansel Bong, RN Outcome: Progressing Goal: Will not experience complications related to urinary retention 10/06/2020 1446 by Ansel Bong, RN Outcome: Progressing 10/06/2020 1446 by Ansel Bong, RN Outcome: Progressing   Problem: Pain Managment: Goal: General experience of comfort will improve 10/06/2020 1446 by Ansel Bong, RN Outcome: Progressing 10/06/2020 1446 by Ansel Bong, RN Outcome: Progressing   Problem: Safety: Goal: Ability to remain free from injury will improve 10/06/2020 1446 by Ansel Bong, RN Outcome: Progressing 10/06/2020 1446 by Ansel Bong, RN Outcome: Progressing   Problem: Skin Integrity: Goal: Risk for impaired skin integrity will decrease 10/06/2020 1446 by Ansel Bong, RN Outcome: Progressing 10/06/2020 1446 by Ansel Bong, RN Outcome: Progressing

## 2020-10-06 NOTE — Telephone Encounter (Signed)
Can you call.  I know.  I get daily updates on the computer, and I am sorry to hear it.  I would talk to the doctors in the hospital about her labs, imaging, etc. They will know more what is going on now.  She does have a bowel obstruction when I look at the CT and x-rays.

## 2020-10-06 NOTE — Progress Notes (Signed)
PT Cancellation Note  Patient Details Name: Paula Luna MRN: [id number] DOB: 12-Oct-1924   Cancelled Treatment:    Reason Eval/Treat Not Completed: Fatigue/lethargy limiting ability to participate; Pt would not open her eyes upon attempt at PT session.  With verbal and tactile stimulation pt would mumble briefly but then fall back asleep. Nurse entered room and stated pt was awake for much of the night.  Will attempt to see pt at a future date/time as medically appropriate.     Ovidio Hanger PT, DPT 10/06/20, 11:20 AM

## 2020-10-06 NOTE — Telephone Encounter (Signed)
Paula Luna notified as instructed by telephone.  She feels like nothing is getting done. The bowel blockage is better so they are talking about discharging her home tomorrow.  Paula Luna states that Dr. Sudie Grumbling has now cancelled 2 appointments on them so Paula Luna's knee has not been looked at in a long time.  She did ask the doctors in the hospital to do an x-ray of her knee, which they did, but she doesn't know the results.   Paula Luna states she needs some help at home taking care of her mom.  She is still trying to work and take are of her mom and it is too much.  Paula Luna is very frustrated and I felt there was nothing I go do or say to help her.  I told her I would I ask Dr. Patsy Lager to call her so they can talk about what's best to do for Paula Luna moving forward.

## 2020-10-06 NOTE — Progress Notes (Signed)
PROGRESS NOTE    Paula CornsRoxanne D Luna  ZOX:[id number] DOB: 02-24-1925 DOA: 10/01/2020 PCP: Hannah Beatopland, [place], MD    Brief Narrative:  Paula HutchingRoxanne D Luna a 87 y.o.femalewith PMH of HTN, A. 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 Patient feels like she has a "knot"in her upper abdomen. She has not had a "good"bowel movement in days.  ED w/up: Showed small bowel obstruction.  Surgery consulted and patient admitted for further management as below.  As of 10/06/2020 patient is tolerating full liquids and diet has been advanced to soft by general surgery.  No further surgical intervention warranted.  Patient's daughter at bedside and concerned about lethargy and altered mentation.  Suspect hospital-acquired delirium.   Assessment & Plan:   Principal Problem:   SBO (small bowel obstruction) (HCC) Active Problems:   Essential hypertension   Atrial fibrillation (HCC)   Hypokalemia   Malnutrition of moderate degree  Small bowel obstruction Presented initially with abdominal pain, nausea, vomiting Imaging suggestive of mechanical obstruction NG tube was placed, patient self discontinued General surgery was following Diet advanced to soft and surgery signed off Seems to be tolerating p.o. At this time will defer PICC line and TPN Plan: Advance to soft diet per general surgery Supplemental IV fluids Bowel regimen Antiemetics and IV PPI Monitor for BM  Acute encephalopathy Suspect hospital-acquired delirium No obvious infectious or metabolic etiology Daughter concerned about patient's lethargy Per nursing patient is awake most of the night Plan: Delirium precautions Keep patient awake during the day, sleep at night Blinds open during the day Frequent reorienting measures Avoid  sedatives  Chronic atrial fibrillation On Xarelto and Lopressor  Essential hypertension Continue oral antihypertensive regimen As needed IV hydralazine  Hypokalemia Secondary to GI losses Monitor Chem-7 and replace as necessary  History of right patellar fracture Cast in place Orthopedic surgery weightbearing as tolerated Follow-up with orthopedics as outpatient PT and OT evaluations Currently recommending home health therapy At this time patient may not be stable to return back home Request therapy evaluations   DVT prophylaxis: Xarelto Code Status: DNR Family Communication: Daughter at bedside Disposition Plan: Status is: Inpatient  Remains inpatient appropriate because:Inpatient level of care appropriate due to severity of illness   Dispo: The patient is from: Home              Anticipated d/c is to: SNF              Anticipated d/c date is: 1 day              Patient currently is not medically stable to d/c.   Difficult to place patient No   Patient is approaching stability for discharge from medical standpoint.  Would like patient to have a BM prior to discharge.  Also dealing with some lethargy and decreased level of consciousness during the day.  Strongly suspect hospital-acquired delirium.  Will attempt to secure a safe disposition plan within 24 hours.      Consultants:   Neurosurgery, signed off  Procedures:   None  Antimicrobials:   None   Subjective: Patient seen and examined.  Weak and deconditioned.  Soft voice.  No specific complaints.  Objective: Vitals:   10/05/20 2004 10/06/20 0419 10/06/20 0800 10/06/20 1151  BP: 139/86 (!) 161/73 (!) 146/74 (!) 156/69  Pulse:  71 77 72 65  Resp: 18 20 (!) 22 (!) 21  Temp: 98.7 F (37.1 C) (!) 97.4 F (36.3 C) 97.7 F (36.5 C) 98 F (36.7 C)  TempSrc:   Oral Axillary  SpO2: 96% 95% 95% 96%  Weight:      Height:        Intake/Output Summary (Last 24 hours) at 10/06/2020 1525 Last data filed  at 10/06/2020 1300 Gross per 24 hour  Intake 240 ml  Output 2500 ml  Net -2260 ml   Filed Weights   10/01/20 0910  Weight: 54.4 kg    Examination:  General exam: No acute distress.  Appears frail Respiratory system: Poor respiratory effort.  Lungs clear.  Room air Cardiovascular system: S1 & S2 heard, RRR. No JVD, murmurs, rubs, gallops or clicks. No pedal edema. Gastrointestinal system: Soft, nondistended, positive bowel sounds. Central nervous system: Alert and oriented. No focal neurological deficits. Extremities: Symmetric 5 x 5 power. Skin: No rashes, lesions or ulcers Psychiatry: Judgement and insight appear normal. Mood & affect appropriate.     Data Reviewed: I have personally reviewed following labs and imaging studies  CBC: Recent Labs  Lab 10/02/20 0645 10/03/20 0453 10/04/20 0657 10/05/20 0554 10/06/20 0536  WBC 12.2* 9.7 8.2 5.9 6.4  HGB 11.7* 10.7* 11.0* 12.2 13.1  HCT 36.5 33.8* 33.2* 36.5 40.2  MCV 79.5* 80.1 78.5* 76.5* 77.5*  PLT 198 210 169 182 206   Basic Metabolic Panel: Recent Labs  Lab 10/02/20 0645 10/03/20 0453 10/04/20 0657 10/05/20 0554 10/06/20 0536  NA 144 147* 142 142 141  K 4.1 3.9 3.3* 3.3* 3.5  CL 105 107 106 106 107  CO2 23 27 27 27 25   GLUCOSE 118* 197* 141* 138* 113*  BUN 18 24* 14 9 8   CREATININE 0.80 0.79 0.66 0.64 0.52  CALCIUM 9.0 9.5 8.7* 8.4* 8.6*  MG  --  1.9 1.7 1.6* 2.3  PHOS  --  2.4* 2.3* 3.2 2.7   GFR: Estimated Creatinine Clearance: 33.1 mL/min (by C-G formula based on SCr of 0.52 mg/dL). Liver Function Tests: Recent Labs  Lab 10/01/20 0911 10/05/20 0554  AST 19  --   ALT 12  --   ALKPHOS 63  --   BILITOT 1.0  --   PROT 6.8  --   ALBUMIN 3.5 2.6*   Recent Labs  Lab 10/01/20 0911  LIPASE 49   No results for input(s): AMMONIA in the last 168 hours. Coagulation Profile: No results for input(s): INR, PROTIME in the last 168 hours. Cardiac Enzymes: No results for input(s): CKTOTAL, CKMB,  CKMBINDEX, TROPONINI in the last 168 hours. BNP (last 3 results) No results for input(s): PROBNP in the last 8760 hours. HbA1C: No results for input(s): HGBA1C in the last 72 hours. CBG: No results for input(s): GLUCAP in the last 168 hours. Lipid Profile: No results for input(s): CHOL, HDL, LDLCALC, TRIG, CHOLHDL, LDLDIRECT in the last 72 hours. Thyroid Function Tests: No results for input(s): TSH, T4TOTAL, FREET4, T3FREE, THYROIDAB in the last 72 hours. Anemia Panel: No results for input(s): VITAMINB12, FOLATE, FERRITIN, TIBC, IRON, RETICCTPCT in the last 72 hours. Sepsis Labs: Recent Labs  Lab 10/01/20 1513  LATICACIDVEN 1.3    Recent Results (from the past 240 hour(s))  SARS Coronavirus 2 by RT PCR (hospital order, performed in [facility] hospital lab) Nasopharyngeal Nasopharyngeal Swab     Status: None   Collection Time: 10/01/20  3:27 PM   Specimen: Nasopharyngeal Swab  Result Value Ref Range Status   SARS Coronavirus 2 NEGATIVE NEGATIVE Final    Comment: (NOTE) SARS-CoV-2 target nucleic acids are NOT DETECTED.  The SARS-CoV-2 RNA is generally detectable in upper and lower respiratory specimens during the acute phase of infection. The lowest concentration of SARS-CoV-2 viral copies this assay can detect is 250 copies / mL. A negative result does not preclude SARS-CoV-2 infection and should not be used as the sole basis for treatment or other patient management decisions.  A negative result may occur with improper specimen collection / handling, submission of specimen other than nasopharyngeal swab, presence of viral mutation(s) within the areas targeted by this assay, and inadequate number of viral copies (<250 copies / mL). A negative result must be combined with clinical observations, patient history, and epidemiological information.  Fact Sheet for Patients:   BoilerBrush.com.cy  Fact Sheet for Healthcare  Providers: https://pope.com/  This test is not yet approved or  cleared by the [place] FDA and has been authorized for detection and/or diagnosis of SARS-CoV-2 by FDA under an Emergency Use Authorization (EUA).  This EUA will remain in effect (meaning this test can be used) for the duration of the COVID-19 declaration under Section 564(b)(1) of the Act, 21 U.S.C. section 360bbb-3(b)(1), unless the authorization is terminated or revoked sooner.  Performed at [facility], [address] [place], [place] [address]          Radiology Studies: DG Knee Complete 4 Views Right  Result Date: 10/05/2020 CLINICAL DATA:  Patellar fracture by report 3 weeks ago. EXAM: RIGHT KNEE - COMPLETE 4+ VIEW COMPARISON:  August 26, 2020 FINDINGS: Signs of patellar fracture with some increased sclerosis along the margins of the fracture. Alignment in terms of distraction is similar to the prior exam. Signs of degenerative change in osteopenia about the knee with similar appearance. No joint effusion IMPRESSION: Signs of patellar fracture with some increased sclerosis along the margins of the fracture, compatible with subacute fracture. No change in 2-3 mm of distraction. Electronically Signed   By: Donzetta Kohut M.D.   On: 10/05/2020 11:56   DG Abd 2 Views  Result Date: 10/05/2020 CLINICAL DATA:  History of ischemic bowel disease resection for years ago. Abdominal pain, mostly on the right EXAM: ABDOMEN - 2 VIEW COMPARISON:  Two days ago FINDINGS: Favorable increase in colonic gas, especially in the ascending colon. The degree of gas distended small bowel loops on the left is not progressed and the largest loops are seen about bowel sutures and may be postoperative. No concerning mass effect or gas collection. Airspace disease at the bases, known. IMPRESSION: Favorable increase in colonic gas. No progression of small bowel distension. Electronically Signed   By:  Marnee Spring M.D.   On: 10/05/2020 07:45        Scheduled Meds: . amLODipine  5 mg Oral Daily  . cholecalciferol  2,000 Units Oral Daily  . feeding supplement  237 mL Oral BID BM  . fluticasone  1 spray Each Nare Daily  . lisinopril  40 mg Oral Daily  . melatonin  5 mg Oral QPM  . metoprolol succinate  25 mg Oral Daily  . pantoprazole (PROTONIX) IV  40 mg Intravenous Q24H  . Rivaroxaban  15 mg Oral Q supper   Continuous Infusions: . dextrose 5 % and 0.45% NaCl 100 mL/hr at 10/05/20 0734     LOS: 5 days    Time spent: 25 minutes    Dakiya Puopolo B  Georgeann Oppenheim, MD [facility] Pager [phone]  If 7PM-7AM, please contact night-coverage 10/06/2020, 3:25 PM

## 2020-10-06 NOTE — Progress Notes (Signed)
[facility] SURGICAL PROGRESS NOTE (cpt [phone])  Hospital Day(s): 5.   Interval History: Patient seen and examined, no acute events or new complaints overnight. Patient reports she is feeling okay this morning, and she denies abdominal pain, distension, nausea, or emesis. CBC remains reassuring without leukocytosis or anemia. Renal function remains normal, sCr - 0.52 and UO - 3.1L. No electrolyte derangements. She tolerated full liquids yesterday without issues. She continues to have bowel function. Working with therapies for previous right patellar fracture.   Review of Systems:  Constitutional: denies fever, chills  HEENT: denies cough or congestion  Respiratory: denies any shortness of breath  Cardiovascular: denies chest pain or palpitations  Gastrointestinal: denies abdominal pain, N/V, or diarrhea/and bowel function as per interval history Genitourinary: denies burning with urination or urinary frequency Musculoskeletal: + right knee pain (known patellar fracture prior to admission)   Vital signs in last 24 hours: [min-max] current  Temp:  [97.4 F (36.3 C)-98.7 F (37.1 C)] 97.4 F (36.3 C) (01/25 0419) Pulse Rate:  [60-77] 77 (01/25 0419) Resp:  [16-20] 20 (01/25 0419) BP: (139-189)/(70-98) 161/73 (01/25 0419) SpO2:  [93 %-96 %] 95 % (01/25 0419)     Height: 4\' 11"  (149.9 cm) Weight: 54.4 kg BMI (Calculated): 24.22   Intake/Output last 2 shifts:  01/24 0701 - 01/25 0700 In: 250 [I.V.:100; IV Piggyback:150] Out: 3150 [Urine:3150]   Physical Exam:  Constitutional: alert, cooperative and no distress  HENT: normocephalic without obvious abnormality  Eyes: PERRL, EOM's grossly intact and symmetric  Respiratory: breathing non-labored at rest  Cardiovascular: regular rate and sinus rhythm  Gastrointestinal: soft, non-tender, and non-distended, no rebound/guarding   Labs:  CBC Latest Ref Rng & Units 10/06/2020 10/05/2020 10/04/2020  WBC 4.0 - 10.5 K/uL 6.4 5.9  8.2  Hemoglobin 12.0 - 15.0 g/dL 10/06/2020 57.3 11.0(L)  Hematocrit 36.0 - 46.0 % 40.2 36.5 33.2(L)  Platelets 150 - 400 K/uL 206 182 169   CMP Latest Ref Rng & Units 10/06/2020 10/05/2020 10/04/2020  Glucose 70 - 99 mg/dL 10/06/2020) 254(Y) 706(C)  BUN 8 - 23 mg/dL 8 9 14   Creatinine 0.44 - 1.00 mg/dL 376(E 8.31  Sodium 135 - 145 mmol/L 141 142 142  Potassium 3.5 - 5.1 mmol/L 3.5 3.3(L) 3.3(L)  Chloride 98 - 111 mmol/L 107 106 106  CO2 22 - 32 mmol/L 25 27 27   Calcium 8.9 - 10.3 mg/dL 5.17) 6.16) )  Total Protein 6.5 - 8.1 g/dL - - -  Total Bilirubin 0.3 - 1.2 mg/dL - - -  Alkaline Phos 38 - 126 U/L - - -  AST 15 - 41 U/L - - -  ALT 0 - 44 U/L - - -     Imaging studies: No new pertinent imaging studies   Assessment/Plan: (ICD-10's: K65.609) 87 y.o. female with clinically resolved small bowel obstruction most likely secondary secondary to post-surgical adhesive disease   - I advanced soft diet this morning + nutritional supplementation  - Monitor abdominal examination; on-going bowel function             - Pain control prn (minimize narcotics); antiemetics prn             - Mobilize as feasible              - No need for surgical intervention              - Further management per primary service; we will sign off and be available as needed   -  Discharge Planning: Okay for discharge from surgical standpoint, she can follow up with PCP. She does NOT need surgical follow up   All of the above findings and recommendations were discussed with the patient, and the medical team, and all of patient's questions were answered to her  expressed satisfaction.  -- Lynden Oxford, PA-C [facility] 10/06/2020, 7:28 AM [phone] M-F: 7am - 4pm

## 2020-10-07 LAB — BASIC METABOLIC PANEL
Anion gap: 11 (ref 5–15)
BUN: 15 mg/dL (ref 8–23)
CO2: 25 mmol/L (ref 22–32)
Calcium: 8.9 mg/dL (ref 8.9–10.3)
Chloride: 104 mmol/L (ref 98–111)
Creatinine, Ser: 0.78 mg/dL (ref 0.44–1.00)
GFR, Estimated: >60 mL/min (ref >60)
Glucose, Bld: 91 mg/dL (ref 70–99)
Potassium: 3.3 mmol/L — ABNORMAL LOW (ref 3.5–5.1)
Sodium: 140 mmol/L (ref 135–145)

## 2020-10-07 LAB — CBC
HCT: 38.1 % (ref 36.0–46.0)
Hemoglobin: 12.5 g/dL (ref 12.0–15.0)
MCH: 25.6 pg — ABNORMAL LOW (ref 26.0–34.0)
MCHC: 32.8 g/dL (ref 30.0–36.0)
MCV: 78.1 fL — ABNORMAL LOW (ref 80.0–100.0)
Platelets: 215 10*3/uL (ref 150–400)
RBC: 4.88 MIL/uL (ref 3.87–5.11)
RDW: 15 % (ref 11.5–15.5)
WBC: 8.1 10*3/uL (ref 4.0–10.5)
nRBC: 0 % (ref 0.0–0.2)

## 2020-10-07 MED ORDER — POLYETHYLENE GLYCOL 3350 17 G PO PACK
17.0000 g | PACK | Freq: Every day | ORAL | Status: DC
  Administered 2020-10-07 – 2020-10-09 (×3): 17 g via ORAL
  Filled 2020-10-07 (×3): qty 1

## 2020-10-07 MED ORDER — ADULT MULTIVITAMIN W/MINERALS CH
1.0000 | ORAL_TABLET | Freq: Every day | ORAL | Status: DC
  Administered 2020-10-08 – 2020-10-09 (×2): 1 via ORAL
  Filled 2020-10-07 (×3): qty 1

## 2020-10-07 MED ORDER — SENNOSIDES-DOCUSATE SODIUM 8.6-50 MG PO TABS
1.0000 | ORAL_TABLET | Freq: Two times a day (BID) | ORAL | Status: DC
  Administered 2020-10-07 – 2020-10-09 (×4): 1 via ORAL
  Filled 2020-10-07 (×4): qty 1

## 2020-10-07 NOTE — TOC Initial Note (Signed)
Transition of Care [facility]) - Initial/Assessment Note    Patient Details  Name: Paula Luna MRN: [id number] Date of Birth: 07/14/1925  Transition of [facility]) CM/SW Contact:    Maree Krabbe, LCSW Phone Number: 10/07/2020, 2:36 PM  Clinical Narrative:   Pt's daughter present at bedside. Pt's daughter is agreeable for pt to go to SNF. Pt's daughter would prefer location in [place] or [place]. Pt's daughter agreeable to f/o to those facilities. CSW will provide bed offers once available.                 Expected Discharge Plan: Skilled Nursing Facility Barriers to Discharge: Continued Medical Work up   Patient Goals and CMS Choice Patient states their goals for this hospitalization and ongoing recovery are:: to get pt better   Choice offered to / list presented to : Adult Children  Expected Discharge Plan and Services Expected Discharge Plan: Skilled Nursing Facility In-house Referral: NA   Post Acute Care Choice: Skilled Nursing Facility Living arrangements for the past 2 months: Single Family Home                                      Prior Living Arrangements/Services Living arrangements for the past 2 months: Single Family Home Lives with:: Adult Children Patient language and need for interpreter reviewed:: Yes Do you feel safe going back to the place where you live?: Yes      Need for Family Participation in Patient Care: Yes (Comment) Care giver support system in place?: Yes (comment)   Criminal Activity/Legal Involvement Pertinent to Current Situation/Hospitalization: No - Comment as needed  Activities of Daily Living Home Assistive Devices/Equipment: None ADL Screening (condition at time of admission) Patient's cognitive ability adequate to safely complete daily activities?: Yes Is the patient deaf or have difficulty hearing?: No Does the patient have difficulty seeing, even when wearing glasses/contacts?: No Does the patient have difficulty  concentrating, remembering, or making decisions?: No Patient able to express need for assistance with ADLs?: Yes Does the patient have difficulty dressing or bathing?: No Independently performs ADLs?: Yes (appropriate for developmental age) Does the patient have difficulty walking or climbing stairs?: Yes Weakness of Legs: Right Weakness of Arms/Hands: None  Permission Sought/Granted Permission sought to share information with : Family Supports    Share Information with NAME: [name]     Permission granted to share info w Relationship: daughter     Emotional Assessment Appearance:: Appears stated age Attitude/Demeanor/Rapport: Unable to Assess Affect (typically observed): Unable to Assess Orientation: :  (UTA) Alcohol / Substance Use: Not Applicable Psych Involvement: No (comment)  Admission diagnosis:  SBO (small bowel obstruction) (HCC) [K56.609] Intractable vomiting with nausea, unspecified vomiting type [R11.2] Patient Active Problem List   Diagnosis Date Noted  . Malnutrition of moderate degree 10/05/2020  . SBO (small bowel obstruction) (HCC) 10/01/2020  . Hypokalemia 10/01/2020  . Atrial fibrillation (HCC) 04/26/2017  . 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 Macular degeneration of left eye 01/06/2015  . Essential hypertension 12/25/2013   PCP:  Hannah Beat, MD Pharmacy:   [facility] [address] [place] - [address] [place] [address] Phone: [phone] Fax: [phone]  [facility] [address] [place], [place] - [address] [place] [address] Phone: [phone] Fax: [phone]  Social Determinants of Health (SDOH) Interventions    Readmission Risk Interventions No flowsheet data found.

## 2020-10-07 NOTE — NC FL2 (Signed)
[place] MEDICAID FL2 LEVEL OF CARE SCREENING TOOL     IDENTIFICATION  Patient Name: Paula Luna Birthdate: 1925-06-05 Sex: female Admission Date (Current Location): 10/01/2020  [place] and [place] Number:  Chiropodist and Address:  [facility], [address], [place], [place] [address]      Provider Number: [id number]  Attending Physician Name and Address:  Tresa Moore, MD  Relative Name and Phone Number:       Current Level of Care: Hospital Recommended Level of Care: Skilled Nursing Facility Prior Approval Number:    Date Approved/Denied:   PASRR Number: [id number]  Discharge Plan: SNF    Current Diagnoses: Patient Active Problem List   Diagnosis Date Noted  . Malnutrition of moderate degree 10/05/2020  . SBO (small bowel obstruction) (HCC) 10/01/2020  . Hypokalemia 10/01/2020  . Atrial fibrillation (HCC) 04/26/2017  . 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 Macular degeneration of left eye 01/06/2015  . Essential hypertension 12/25/2013    Orientation RESPIRATION BLADDER Height & Weight      (UTA)  Normal Continent Weight: 120 lb (54.4 kg) Height:  4\' 11"  (149.9 cm)  BEHAVIORAL SYMPTOMS/MOOD NEUROLOGICAL BOWEL NUTRITION STATUS      Continent Diet (soft diet)  AMBULATORY STATUS COMMUNICATION OF NEEDS Skin   Extensive Assist Verbally Normal                       Personal Care Assistance Level of Assistance  Bathing,Feeding,Dressing Bathing Assistance: Maximum assistance Feeding assistance: Independent Dressing Assistance: Maximum assistance     Functional Limitations Info  Sight,Hearing,Speech Sight Info: Adequate Hearing Info: Adequate Speech Info: Adequate    SPECIAL CARE FACTORS FREQUENCY  PT (By licensed PT),OT (By licensed OT)     PT Frequency: 5x OT Frequency: 5x            Contractures Contractures Info: Not present     Additional Factors Info  Code Status,Allergies Code Status Info: DNR Allergies Info: Latex, Tetanus Toxoid, Alendronate Sodium           Current Medications (10/07/2020):  This is the current hospital active medication list Current Facility-Administered Medications  Medication Dose Route Frequency Provider Last Rate Last Admin  . amLODipine (NORVASC) tablet 5 mg  5 mg Oral Daily 10/09/2020, MD   5 mg at 10/07/20 1029  . cholecalciferol (VITAMIN D3) tablet 2,000 Units  2,000 Units Oral Daily 10/09/20, MD   2,000 Units at 10/07/20 1029  . dextrose 5 %-0.45 % sodium chloride infusion   Intravenous Continuous 10/09/20, NP 100 mL/hr at 10/05/20 0734 New Bag at 10/05/20 0734  . feeding supplement (ENSURE ENLIVE / ENSURE PLUS) liquid 237 mL  237 mL Oral BID BM 10/07/20, PA-C   237 mL at 10/07/20 1021  . fluticasone (FLONASE) 50 MCG/ACT nasal spray 1 spray  1 spray Each Nare Daily Agbata, Tochukwu, MD   1 spray at 10/04/20 1100  . haloperidol lactate (HALDOL) injection 2 mg  2 mg Intravenous Q6H PRN 10/06/20, MD   2 mg at 10/03/20 1147  . hydrALAZINE (APRESOLINE) injection 10 mg  10 mg Intravenous Q6H PRN Agbata, Tochukwu, MD   10 mg at 10/03/20 1710  . lisinopril (ZESTRIL) tablet 40 mg  40 mg Oral Daily 10/05/20, MD   40 mg at 10/07/20 1029  . melatonin tablet 5 mg  5 mg  Oral QPM Gillis Santa, MD   5 mg at 10/04/20 2103  . metoprolol succinate (TOPROL-XL) 24 hr tablet 25 mg  25 mg Oral Daily Gillis Santa, MD   25 mg at 10/07/20 1029  . metoprolol tartrate (LOPRESSOR) injection 5 mg  5 mg Intravenous Q8H PRN Gillis Santa, MD      . morphine 2 MG/ML injection 2 mg  2 mg Intravenous Q4H PRN Agbata, Tochukwu, MD   2 mg at 10/01/20 1723  . ondansetron (ZOFRAN) tablet 4 mg  4 mg Oral Q6H PRN Agbata, Tochukwu, MD       Or  . ondansetron (ZOFRAN) injection 4 mg  4 mg Intravenous Q6H PRN Agbata, Tochukwu, MD   4 mg at 10/01/20 1722  . pantoprazole  (PROTONIX) injection 40 mg  40 mg Intravenous Q24H Agbata, Tochukwu, MD   40 mg at 10/06/20 1755  . Rivaroxaban (XARELTO) tablet 15 mg  15 mg Oral Q supper Gillis Santa, MD   15 mg at 10/06/20 1755     Discharge Medications: Please see discharge summary for a list of discharge medications.  Relevant Imaging Results:  Relevant Lab Results:   Additional Information SSN:[id number]  Maree Krabbe, LCSW

## 2020-10-07 NOTE — Progress Notes (Signed)
PROGRESS NOTE    Paula Luna  NIO:[id number] DOB: [birth year] DOA: 10/01/2020 PCP: Paula Beat, MD    Brief Narrative:  Paula Luna a 87 y.o.femalewith PMH of HTN, A. 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 Patient feels like she has a "knot"in her upper abdomen. She has not had a "good"bowel movement in days.  ED w/up: Showed small bowel obstruction.  Surgery consulted and patient admitted for further management as below.  As of 10/06/2020 patient is tolerating full liquids and diet has been advanced to soft by general surgery.  No further surgical intervention warranted.  Patient's daughter at bedside and concerned about lethargy and altered mentation.  Suspect hospital-acquired delirium.  On my discussion with the patient's daughter the patient may be too much for her to care for at home.  Notified TOC.  Starting bed search for skilled nursing facility.  Patient remained stable.  No bowel movement reported yet.   Assessment & Plan:   Principal Problem:   SBO (small bowel obstruction) (HCC) Active Problems:   Essential hypertension   Atrial fibrillation (HCC)   Hypokalemia   Malnutrition of moderate degree  Small bowel obstruction Presented initially with abdominal pain, nausea, vomiting Imaging suggestive of mechanical obstruction NG tube was placed, patient self discontinued General surgery was following Diet advanced to soft and surgery signed off Seems to be tolerating p.o. At this time will defer PICC line and TPN Plan: Continue soft diet Continue maintenance fluids for today, can likely discontinue tomorrow Bowel regimen Antiemetics and IV PPI Monitor for BM  Acute encephalopathy Suspect hospital-acquired delirium No obvious infectious or  metabolic etiology Daughter concerned about patient's lethargy Per nursing patient is awake most of the night Plan: Delirium precautions Keep patient awake during the day, sleep at night Blinds open during the day Frequent reorienting measures Avoid sedatives  Chronic atrial fibrillation On Xarelto and Lopressor  Essential hypertension Continue oral antihypertensive regimen As needed IV hydralazine  Hypokalemia Secondary to GI losses Monitor Chem-7 and replace as necessary  History of right patellar fracture Cast in place Orthopedic surgery weightbearing as tolerated Follow-up with orthopedics as outpatient PT and OT evaluations Currently recommending home health therapy At this time patient may not be stable to return back home Request therapy evaluations   DVT prophylaxis: Xarelto Code Status: DNR Family Communication: Daughter at bedside 1/25; Daughter Paula Luna [phone] on 1/26 Disposition Plan: Status is: Inpatient  Remains inpatient appropriate because:Inpatient level of care appropriate due to severity of illness   Dispo: The patient is from: Home              Anticipated d/c is to: SNF              Anticipated d/c date is: 1 day              Patient currently is not medically stable to d/c.   Difficult to place patient No   Patient is approaching stability for discharge from medical standpoint.  Would like patient to have a BM prior to discharge. Encephalopathy improved.  Attempting to secure SNF disposition.  Bed search initiated.     Consultants:   Neurosurgery, signed off  Procedures:   None  Antimicrobials:   None   Subjective: Patient seen and examined.  Less confused this morning.  Still weak.  Objective: Vitals:   10/06/20 1625 10/06/20 1943 10/07/20 0501 10/07/20 0753  BP: 130/66 128/63 (!) 143/83 137/79  Pulse: 71 (!) 58 (!) 54 (!) 53  Resp: 18 16 15 18   Temp: 97.8 F (36.6 C) 97.8 F (36.6 C) 97.6 F (36.4 C) (!) 97.5 F  (36.4 C)  TempSrc: Oral Oral Oral Oral  SpO2: 93% 96% 97% 93%  Weight:      Height:        Intake/Output Summary (Last 24 hours) at 10/07/2020 1343 Last data filed at 10/07/2020 1008 Gross per 24 hour  Intake 480 ml  Output 800 ml  Net -320 ml   Filed Weights   10/01/20 0910  Weight: 54.4 kg    Examination:  General exam: No acute distress.  Appears frail Respiratory system: Poor respiratory effort.  Lungs clear.  Room air Cardiovascular system: S1 & S2 heard, RRR. No JVD, murmurs, rubs, gallops or clicks. No pedal edema. Gastrointestinal system: Soft, nondistended, positive bowel sounds. Central nervous system: Alert and oriented. No focal neurological deficits. Extremities: Symmetric 5 x 5 power. Skin: No rashes, lesions or ulcers Psychiatry: Judgement and insight appear normal. Mood & affect appropriate.     Data Reviewed: I have personally reviewed following labs and imaging studies  CBC: Recent Labs  Lab 10/03/20 0453 10/04/20 0657 10/05/20 0554 10/06/20 0536 10/07/20 0531  WBC 9.7 8.2 5.9 6.4 8.1  HGB 10.7* 11.0* 12.2 13.1 12.5  HCT 33.8* 33.2* 36.5 40.2 38.1  MCV 80.1 78.5* 76.5* 77.5* 78.1*  PLT 210 169 182 206 215   Basic Metabolic Panel: Recent Labs  Lab 10/03/20 0453 10/04/20 0657 10/05/20 0554 10/06/20 0536 10/07/20 0531  NA 147* 142 142 141 140  K 3.9 3.3* 3.3* 3.5 3.3*  CL 107 106 106 107 104  CO2 27 27 27 25 25   GLUCOSE 197* 141* 138* 113* 91  BUN 24* 14 9 8 15   CREATININE 0.79 0.66 0.64 0.52 0.78  CALCIUM 9.5 8.7* 8.4* 8.6* 8.9  MG 1.9 1.7 1.6* 2.3  --   PHOS 2.4* 2.3* 3.2 2.7  --    GFR: Estimated Creatinine Clearance: 32.4 mL/min (by C-G formula based on SCr of 0.78 mg/dL). Liver Function Tests: Recent Labs  Lab 10/01/20 0911 10/05/20 0554  AST 19  --   ALT 12  --   ALKPHOS 63  --   BILITOT 1.0  --   PROT 6.8  --   ALBUMIN 3.5 2.6*   Recent Labs  Lab 10/01/20 0911  LIPASE 49   No results for input(s): AMMONIA in  the last 168 hours. Coagulation Profile: No results for input(s): INR, PROTIME in the last 168 hours. Cardiac Enzymes: No results for input(s): CKTOTAL, CKMB, CKMBINDEX, TROPONINI in the last 168 hours. BNP (last 3 results) No results for input(s): PROBNP in the last 8760 hours. HbA1C: No results for input(s): HGBA1C in the last 72 hours. CBG: No results for input(s): GLUCAP in the last 168 hours. Lipid Profile: No results for input(s): CHOL, HDL, LDLCALC, TRIG, CHOLHDL, LDLDIRECT in the last 72 hours. Thyroid Function Tests: No results for input(s): TSH, T4TOTAL, FREET4, T3FREE, THYROIDAB in the last 72 hours. Anemia Panel: No results for input(s): VITAMINB12, FOLATE, FERRITIN, TIBC, IRON, RETICCTPCT in the last 72 hours. Sepsis Labs: Recent Labs  Lab 10/01/20 1513  LATICACIDVEN 1.3    Recent Results (from the past 240 hour(s))  SARS Coronavirus 2 by RT PCR (hospital order, performed in [facility]  hospital lab) Nasopharyngeal Nasopharyngeal Swab     Status: None   Collection Time: 10/01/20  3:27 PM   Specimen: Nasopharyngeal Swab  Result Value Ref Range Status   SARS Coronavirus 2 NEGATIVE NEGATIVE Final    Comment: (NOTE) SARS-CoV-2 target nucleic acids are NOT DETECTED.  The SARS-CoV-2 RNA is generally detectable in upper and lower respiratory specimens during the acute phase of infection. The lowest concentration of SARS-CoV-2 viral copies this assay can detect is 250 copies / mL. A negative result does not preclude SARS-CoV-2 infection and should not be used as the sole basis for treatment or other patient management decisions.  A negative result may occur with improper specimen collection / handling, submission of specimen other than nasopharyngeal swab, presence of viral mutation(s) within the areas targeted by this assay, and inadequate number of viral copies (<250 copies / mL). A negative result must be combined with clinical observations, patient history, and  epidemiological information.  Fact Sheet for Patients:   BoilerBrush.com.cy  Fact Sheet for Healthcare Providers: https://pope.com/  This test is not yet approved or  cleared by the [place] FDA and has been authorized for detection and/or diagnosis of SARS-CoV-2 by FDA under an Emergency Use Authorization (EUA).  This EUA will remain in effect (meaning this test can be used) for the duration of the COVID-19 declaration under Section 564(b)(1) of the Act, 21 U.S.C. section 360bbb-3(b)(1), unless the authorization is terminated or revoked sooner.  Performed at [facility], [address] [place], [place] [address]          Radiology Studies: No results found.      Scheduled Meds: . amLODipine  5 mg Oral Daily  . cholecalciferol  2,000 Units Oral Daily  . feeding supplement  237 mL Oral BID BM  . fluticasone  1 spray Each Nare Daily  . lisinopril  40 mg Oral Daily  . melatonin  5 mg Oral QPM  . metoprolol succinate  25 mg Oral Daily  . pantoprazole (PROTONIX) IV  40 mg Intravenous Q24H  . Rivaroxaban  15 mg Oral Q supper   Continuous Infusions: . dextrose 5 % and 0.45% NaCl 100 mL/hr at 10/05/20 0734     LOS: 6 days    Time spent: 25 minutes    Tresa Moore, MD [facility] Pager [phone]  If 7PM-7AM, please contact night-coverage 10/07/2020, 1:43 PM

## 2020-10-07 NOTE — Telephone Encounter (Signed)
I spoke with the patient's daughter and.  She is doing better, chart has been reviewed.  Bowel obstruction appears to be resolving, though the patient has not had a bowel movement.  General surgery has signed off, no indication for operative management.  And wanted to talk about her mom's knee x-ray, and this does look stable with some evidence of healing.  They are planning on skilled nursing placement after discharge.  SnF should coordinate with Dr. Jeannetta Ellis office for knee parameters.

## 2020-10-07 NOTE — Progress Notes (Signed)
Physical Therapy Treatment Patient Details Name: Paula Luna MRN: [id number] DOB: 01-30-1925 Today's Date: 10/07/2020    History of Present Illness Per MD notes: Pt is a 87 y.o. female with medical history significant for hypertension, history of A. 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  Pt also with orders for RLE WBAT per Dr. Odis Luster with KI donned at all times except for hygiene. MD assessment includes: Small bowel obstruction, Hypokalemia, agitation, and Hypophosphatemia.    PT Comments    Pt was pleasant and motivated to participate during the session.  Pt did not require physical assistance with any functional task this session.  Pt required extra time and effort with bed mobility tasks and cues for proper sequencing during sit to/from stand transfer training from various surfaces but was steady throughout.  Pt was able to amb 125' and then 25' with a RW with good stability with min cues for amb closer to the RW with upright posture.  Pt will benefit from HHPT services upon discharge to safely address deficits listed in patient problem list for decreased caregiver assistance and eventual return to PLOF.     Follow Up Recommendations  Home health PT;Supervision for mobility/OOB     Equipment Recommendations  Rolling walker with 5" wheels    Recommendations for Other Services       Precautions / Restrictions Precautions Precautions: Fall Required Braces or Orthoses: Knee Immobilizer - Right Knee Immobilizer - Right: On at all times;Other (comment) (Off for hygiene) Restrictions Weight Bearing Restrictions: Yes RLE Weight Bearing: Weight bearing as tolerated Other Position/Activity Restrictions: No R knee ROM exercises    Mobility  Bed Mobility Overal bed  mobility: Modified Independent Bed Mobility: Supine to Sit           General bed mobility comments: Extra time, effort, and use of the bed rail required but no physical assist needed  Transfers Overall transfer level: Needs assistance Equipment used: Rolling walker (2 wheeled) Transfers: Sit to/from Stand Sit to Stand: Min guard         General transfer comment: Mod verbal cues for sequencing with no physical assistance needed  Ambulation/Gait Ambulation/Gait assistance: Min guard Gait Distance (Feet): 125 Feet x 1, 25 Feet x 1 Assistive device: Rolling walker (2 wheeled) Gait Pattern/deviations: Step-through pattern;Decreased step length - right;Decreased step length - left Gait velocity: decreased   General Gait Details: Min verbal cues for amb closer to the RW with more upright posture and for general safety during turns   Optometrist    Modified Rankin (Stroke Patients Only)       Balance Overall balance assessment: Needs assistance Sitting-balance support: Feet supported Sitting balance-Leahy Scale: Good     Standing balance support: Bilateral upper extremity supported;During functional activity Standing balance-Leahy Scale: Fair Standing balance comment: Min lean on the RW for support but steady without LOB                            Cognition Arousal/Alertness: Awake/alert Behavior During Therapy: WFL for tasks assessed/performed Overall Cognitive Status: Within Functional Limits for tasks assessed  Exercises Total Joint Exercises Ankle Circles/Pumps: AROM;Strengthening;Both;5 reps;10 reps Gluteal Sets: Strengthening;Both;10 reps Long Arc Quad: AROM;Strengthening;Left;10 reps;5 reps    General Comments        Pertinent Vitals/Pain Pain Assessment: No/denies pain    Home Living                      Prior Function            PT  Goals (current goals can now be found in the care plan section) Progress towards PT goals: Progressing toward goals    Frequency    Min 2X/week      PT Plan Current plan remains appropriate    Co-evaluation              AM-PAC PT "6 Clicks" Mobility   Outcome Measure  Help needed turning from your back to your side while in a flat bed without using bedrails?: A Little Help needed moving from lying on your back to sitting on the side of a flat bed without using bedrails?: A Little Help needed moving to and from a bed to a chair (including a wheelchair)?: A Little Help needed standing up from a chair using your arms (e.g., wheelchair or bedside chair)?: A Little Help needed to walk in hospital room?: A Little Help needed climbing 3-5 steps with a railing? : A Little 6 Click Score: 18    End of Session Equipment Utilized During Treatment: Gait belt;Right knee immobilizer Activity Tolerance: Patient tolerated treatment well Patient left: in bed;with call bell/phone within reach;with family/visitor present Nurse Communication: Mobility status;Other (comment) (Pt left sitting on EOB per pt/daughter request so dtr could brush pt's hair, nursing notified) PT Visit Diagnosis: History of falling (Z91.81);Difficulty in walking, not elsewhere classified (R26.2);Muscle weakness (generalized) (M62.81)     Time: 1334-1400 PT Time Calculation (min) (ACUTE ONLY): 26 min  Charges:  $Gait Training: 8-22 mins $Therapeutic Exercise: 8-22 mins                     D. Scott Haldon Carley PT, DPT 10/07/20, 2:42 PM

## 2020-10-07 NOTE — Progress Notes (Signed)
Nutrition Follow Up Note   DOCUMENTATION CODES:   Non-severe (moderate) malnutrition in context of chronic illness  INTERVENTION:   Ensure Enlive po BID, each supplement provides 350 kcal and 20 grams of protein  Magic cup TID with meals, each supplement provides 290 kcal and 9 grams of protein  MVI po daily   NUTRITION DIAGNOSIS:   Moderate Malnutrition related to chronic illness (Ischemic bowel disease) as evidenced by mild fat depletion,moderate fat depletion,mild muscle depletion,moderate muscle depletion.  GOAL:   Patient will meet greater than or equal to 90% of their needs  -progressing   MONITOR:   PO intake,Supplement acceptance,Labs,Weight trends,TF tolerance,Skin  ASSESSMENT:   87 y.o. female with medical history significant for hypertension, A. fib, ischemic bowel disease status post small bowel resection 2018 (removed 64cm jejunum) who presents with SBO  Pt s/p PICC line placement 1/22 but pt removed PICC line and NGT secondary to confusion. TPN was never initiated. PICC line was unable to be replaced and then pt starting passing flatus and was able to be placed on a diet with supplements. Pt advanced to a soft diet on 1/25. Pt documented to be eating anywhere from sips/bites to 100% of meals. Pt ate 100% of her breakfast this morning which included eggs, sausage and english muffin but ate only bites of her lunch. Pt is drinking some Ensure supplements. Refeed labs improving. RD will add Magic Cups to meal trays to help her meet her estimated needs. No new weight since admit; pt is ordered for daily weights. SNF placement pending.   Medications reviewed and include: protonix, D3, melatonin, miralax, senokot, NaCl w/ 5% dextrose @50ml /hr  Labs reviewed: K 3.3(L) P 2.7 wnl, Mg 2.3 wnl- 1/25  Diet Order:   Diet Order            DIET SOFT Room service appropriate? Yes; Fluid consistency: Thin  Diet effective now                EDUCATION NEEDS:   Education  needs have been addressed  Skin:  Skin Assessment: Reviewed RN Assessment  Last BM:  12/21  Height:   Ht Readings from Last 1 Encounters:  10/01/20 4\' 11"  (1.499 m)    Weight:   Wt Readings from Last 1 Encounters:  10/01/20 54.4 kg    Ideal Body Weight:  44.5 kg  BMI:  Body mass index is 24.24 kg/m.  Estimated Nutritional Needs:   Kcal:  1200-1400kcal/day  Protein:  60-70g/day  Fluid:  1.1-1.3L/day  MS, RD, LDN Please refer to [facility] for RD and/or RD on-call/weekend/after hours pager

## 2020-10-08 LAB — BASIC METABOLIC PANEL
Anion gap: 7 (ref 5–15)
BUN: 21 mg/dL (ref 8–23)
CO2: 27 mmol/L (ref 22–32)
Calcium: 8.7 mg/dL — ABNORMAL LOW (ref 8.9–10.3)
Chloride: 104 mmol/L (ref 98–111)
Creatinine, Ser: 0.91 mg/dL (ref 0.44–1.00)
GFR, Estimated: 58 mL/min — ABNORMAL LOW (ref >60)
Glucose, Bld: 115 mg/dL — ABNORMAL HIGH (ref 70–99)
Potassium: 3.7 mmol/L (ref 3.5–5.1)
Sodium: 138 mmol/L (ref 135–145)

## 2020-10-08 LAB — CBC
HCT: 36.9 % (ref 36.0–46.0)
Hemoglobin: 12 g/dL (ref 12.0–15.0)
MCH: 25.3 pg — ABNORMAL LOW (ref 26.0–34.0)
MCHC: 32.5 g/dL (ref 30.0–36.0)
MCV: 77.8 fL — ABNORMAL LOW (ref 80.0–100.0)
Platelets: 223 10*3/uL (ref 150–400)
RBC: 4.74 MIL/uL (ref 3.87–5.11)
RDW: 14.8 % (ref 11.5–15.5)
WBC: 9.6 10*3/uL (ref 4.0–10.5)
nRBC: 0 % (ref 0.0–0.2)

## 2020-10-08 LAB — HEPATIC FUNCTION PANEL
ALT: 19 U/L (ref 0–44)
AST: 20 U/L (ref 15–41)
Albumin: 2.8 g/dL — ABNORMAL LOW (ref 3.5–5.0)
Alkaline Phosphatase: 68 U/L (ref 38–126)
Bilirubin, Direct: 0.1 mg/dL (ref 0.0–0.2)
Indirect Bilirubin: 0.4 mg/dL (ref 0.3–0.9)
Total Bilirubin: 0.5 mg/dL (ref 0.3–1.2)
Total Protein: 5.7 g/dL — ABNORMAL LOW (ref 6.5–8.1)

## 2020-10-08 LAB — ALBUMIN: Albumin: 2.7 g/dL — ABNORMAL LOW (ref 3.5–5.0)

## 2020-10-08 MED ORDER — PANTOPRAZOLE SODIUM 40 MG PO TBEC
40.0000 mg | DELAYED_RELEASE_TABLET | Freq: Every day | ORAL | Status: DC
  Administered 2020-10-08 – 2020-10-09 (×2): 40 mg via ORAL
  Filled 2020-10-08 (×2): qty 1

## 2020-10-08 NOTE — Care Management Important Message (Signed)
Important Message  Patient Details  Name: Paula Luna MRN: [id number] Date of Birth: 28-Jan-1925   Medicare Important Message Given:  Yes     Johnell Comings 10/08/2020, 1:49 PM

## 2020-10-08 NOTE — Progress Notes (Signed)
PROGRESS NOTE    Paula Luna  JSE:[id number] DOB: 1924/12/22 DOA: 10/01/2020 PCP: Hannah Beat, MD    Brief Narrative:  Paula Luna a 87 y.o.femalewith PMH of HTN, A. 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 Patient feels like she has a "knot"in her upper abdomen. She has not had a "good"bowel movement in days.  ED w/up: Showed small bowel obstruction.  Surgery consulted and patient admitted for further management as below.  As of 10/06/2020 patient is tolerating full liquids and diet has been advanced to soft by general surgery.  No further surgical intervention warranted.  Patient's daughter at bedside and concerned about lethargy and altered mentation.  Suspect hospital-acquired delirium.  On my discussion with the patient's daughter the patient may be too much for her to care for at home.  Notified TOC.  Starting bed search for skilled nursing facility.  Patient remained stable.  No bowel movement reported yet.   Assessment & Plan:   Principal Problem:   SBO (small bowel obstruction) (HCC) Active Problems:   Essential hypertension   Atrial fibrillation (HCC)   Hypokalemia   Malnutrition of moderate degree  Small bowel obstruction Presented initially with abdominal pain, nausea, vomiting Imaging suggestive of mechanical obstruction NG tube was placed, patient self discontinued General surgery was following Diet advanced to soft and surgery signed off Seems to be tolerating p.o. No indication for parenteral nutrition Plan: Continue soft diet DC IV fluids Gentle bowel regimen Antiemetics As needed Xanax Monitor for BM  Acute encephalopathy Suspect hospital-acquired delirium No obvious infectious or metabolic etiology Daughter concerned about  patient's lethargy Per nursing patient is awake most of the night Encephalopathy appears to be improving Plan: Delirium precautions Keep patient awake during the day, sleep at night Blinds open during the day Frequent reorienting measures Avoid sedatives  Chronic atrial fibrillation On Xarelto and Lopressor  Essential hypertension Continue oral antihypertensive regimen As needed IV hydralazine  Hypokalemia Secondary to GI losses Monitor Chem-7 and replace as necessary  History of right patellar fracture Cast in place Orthopedic surgery weightbearing as tolerated Follow-up with orthopedics as outpatient PT and OT evaluations Currently recommending home health therapy At this time patient may not be stable to return back home Request therapy evaluations Pending discharge to skilled nursing facility   DVT prophylaxis: Xarelto Code Status: DNR Family Communication: Daughter at bedside 1/27 Disposition Plan: Status is: Inpatient  Remains inpatient appropriate because:Unsafe d/c plan   Dispo: The patient is from: Home              Anticipated d/c is to: SNF              Anticipated d/c date is: 1 day              Patient currently is medically stable to d/c.   Difficult to place patient No   Plan to discharge to skilled nursing facility.  Patient essentially asymptomatic and medically stable for discharge at this time.           Consultants:   General surgery, signed off  Procedures:   None  Antimicrobials:   None   Subjective: Patient seen and examined.  Daughter at bedside.  Patient tolerating p.o. although small amounts.  No bowel movement yet.  No complaints  Objective: Vitals:  10/08/20 0528 10/08/20 0529 10/08/20 0833 10/08/20 1208  BP: 105/71  (!) 105/50 (!) 102/48  Pulse: (!) 45 78 (!) 57 (!) 57  Resp: 18  17 16   Temp: (!) 97.2 F (36.2 C)  (!) 97.4 F (36.3 C) 97.8 F (36.6 C)  TempSrc:   Oral Oral  SpO2: 96% 98% 95% 97%   Weight:      Height:        Intake/Output Summary (Last 24 hours) at 10/08/2020 1348 Last data filed at 10/08/2020 1305 Gross per 24 hour  Intake 360 ml  Output 2500 ml  Net -2140 ml   Filed Weights   10/01/20 0910 10/08/20 0500  Weight: 54.4 kg 54.9 kg    Examination:  General exam: No acute distress.  Appears frail Respiratory system: Poor respiratory effort.  Lungs clear.  Room air Cardiovascular system: S1 & S2 heard, RRR. No JVD, murmurs, rubs, gallops or clicks. No pedal edema. Gastrointestinal system: Soft, nondistended, positive bowel sounds. Central nervous system: Alert and oriented. No focal neurological deficits. Extremities: Symmetric 5 x 5 power. Skin: No rashes, lesions or ulcers Psychiatry: Judgement and insight appear normal. Mood & affect appropriate.     Data Reviewed: I have personally reviewed following labs and imaging studies  CBC: Recent Labs  Lab 10/04/20 0657 10/05/20 0554 10/06/20 0536 10/07/20 0531 10/08/20 0003  WBC 8.2 5.9 6.4 8.1 9.6  HGB 11.0* 12.2 13.1 12.5 12.0  HCT 33.2* 36.5 40.2 38.1 36.9  MCV 78.5* 76.5* 77.5* 78.1* 77.8*  PLT 169 182 206 215 223   Basic Metabolic Panel: Recent Labs  Lab 10/03/20 0453 10/04/20 0657 10/05/20 0554 10/06/20 0536 10/07/20 0531 10/08/20 0538  NA 147* 142 142 141 140 138  K 3.9 3.3* 3.3* 3.5 3.3* 3.7  CL 107 106 106 107 104 104  CO2 27 27 27 25 25 27   GLUCOSE 197* 141* 138* 113* 91 115*  BUN 24* 14 9 8 15 21   CREATININE 0.79 0.66 0.64 0.52 0.78 0.91  CALCIUM 9.5 8.7* 8.4* 8.6* 8.9 8.7*  MG 1.9 1.7 1.6* 2.3  --   --   PHOS 2.4* 2.3* 3.2 2.7  --   --    GFR: Estimated Creatinine Clearance: 28.6 mL/min (by C-G formula based on SCr of 0.91 mg/dL). Liver Function Tests: Recent Labs  Lab 10/05/20 0554 10/08/20 0003 10/08/20 0538  AST  --  20  --   ALT  --  19  --   ALKPHOS  --  68  --   BILITOT  --  0.5  --   PROT  --  5.7*  --   ALBUMIN 2.6* 2.8* 2.7*   No results for input(s):  LIPASE, AMYLASE in the last 168 hours. No results for input(s): AMMONIA in the last 168 hours. Coagulation Profile: No results for input(s): INR, PROTIME in the last 168 hours. Cardiac Enzymes: No results for input(s): CKTOTAL, CKMB, CKMBINDEX, TROPONINI in the last 168 hours. BNP (last 3 results) No results for input(s): PROBNP in the last 8760 hours. HbA1C: No results for input(s): HGBA1C in the last 72 hours. CBG: No results for input(s): GLUCAP in the last 168 hours. Lipid Profile: No results for input(s): CHOL, HDL, LDLCALC, TRIG, CHOLHDL, LDLDIRECT in the last 72 hours. Thyroid Function Tests: No results for input(s): TSH, T4TOTAL, FREET4, T3FREE, THYROIDAB in the last 72 hours. Anemia Panel: No results for input(s): VITAMINB12, FOLATE, FERRITIN, TIBC, IRON, RETICCTPCT in the last 72 hours. Sepsis Labs: Recent Labs  Lab 10/01/20 1513  LATICACIDVEN 1.3    Recent Results (from the past 240 hour(s))  SARS Coronavirus 2 by RT PCR (hospital order, performed in [facility] hospital lab) Nasopharyngeal Nasopharyngeal Swab     Status: None   Collection Time: 10/01/20  3:27 PM   Specimen: Nasopharyngeal Swab  Result Value Ref Range Status   SARS Coronavirus 2 NEGATIVE NEGATIVE Final    Comment: (NOTE) SARS-CoV-2 target nucleic acids are NOT DETECTED.  The SARS-CoV-2 RNA is generally detectable in upper and lower respiratory specimens during the acute phase of infection. The lowest concentration of SARS-CoV-2 viral copies this assay can detect is 250 copies / mL. A negative result does not preclude SARS-CoV-2 infection and should not be used as the sole basis for treatment or other patient management decisions.  A negative result may occur with improper specimen collection / handling, submission of specimen other than nasopharyngeal swab, presence of viral mutation(s) within the areas targeted by this assay, and inadequate number of viral copies (<250 copies / mL). A negative  result must be combined with clinical observations, patient history, and epidemiological information.  Fact Sheet for Patients:   BoilerBrush.com.cy  Fact Sheet for Healthcare Providers: https://pope.com/  This test is not yet approved or  cleared by the [place] FDA and has been authorized for detection and/or diagnosis of SARS-CoV-2 by FDA under an Emergency Use Authorization (EUA).  This EUA will remain in effect (meaning this test can be used) for the duration of the COVID-19 declaration under Section 564(b)(1) of the Act, 21 U.S.C. section 360bbb-3(b)(1), unless the authorization is terminated or revoked sooner.  Performed at [facility], [address] [place], [place] [address]          Radiology Studies: No results found.      Scheduled Meds: . amLODipine  5 mg Oral Daily  . cholecalciferol  2,000 Units Oral Daily  . feeding supplement  237 mL Oral BID BM  . fluticasone  1 spray Each Nare Daily  . lisinopril  40 mg Oral Daily  . melatonin  5 mg Oral QPM  . metoprolol succinate  25 mg Oral Daily  . multivitamin with minerals  1 tablet Oral Daily  . pantoprazole (PROTONIX) IV  40 mg Intravenous Q24H  . polyethylene glycol  17 g Oral Daily  . Rivaroxaban  15 mg Oral Q supper  . senna-docusate  1 tablet Oral BID   Continuous Infusions: . dextrose 5 % and 0.45% NaCl 50 mL/hr at 10/07/20 1427     LOS: 7 days    Time spent: 15 minutes    Tresa , MD [facility] Pager [phone]  If 7PM-7AM, please contact night-coverage 10/08/2020, 1:48 PM

## 2020-10-08 NOTE — Progress Notes (Signed)
PT Cancellation Note  Patient Details Name: Paula Luna MRN: [id number] DOB: 06/21/1925   Cancelled Treatment:    Reason Eval/Treat Not Completed: Other (comment): Pt found with dinner just arrived and pt beginning to eat.  Will attempt to see pt at a future date/time as medically appropriate.    Ovidio Hanger PT, DPT 10/08/20, 5:04 PM

## 2020-10-08 NOTE — TOC Progression Note (Addendum)
Transition of Care [facility]) - Progression Note    Patient Details  Name: ATHEA HALEY MRN: [id number] Date of Birth: [birth year]  [facility] CM/SW Contact  Maree Krabbe, LCSW Phone Number: 10/08/2020, 3:49 PM  Clinical Narrative:   Daughter has accepted bed offer at [facility]. CSW has started [place] for SNF and transport (must be provided by [facility]) at d/c.    Expected Discharge Plan: Skilled Nursing Facility Barriers to Discharge: Continued Medical Work up  Expected Discharge Plan and Services Expected Discharge Plan: Skilled Nursing Facility In-house Referral: NA   Post Acute Care Choice: Skilled Nursing Facility Living arrangements for the past 2 months: Single Family Home                                       Social Determinants of Health (SDOH) Interventions    Readmission Risk Interventions No flowsheet data found.

## 2020-10-09 DIAGNOSIS — Z87891 Personal history of nicotine dependence: Secondary | ICD-10-CM | POA: Diagnosis not present

## 2020-10-09 DIAGNOSIS — I4821 Permanent atrial fibrillation: Secondary | ICD-10-CM | POA: Diagnosis not present

## 2020-10-09 DIAGNOSIS — I4891 Unspecified atrial fibrillation: Secondary | ICD-10-CM | POA: Diagnosis not present

## 2020-10-09 DIAGNOSIS — Z7951 Long term (current) use of inhaled steroids: Secondary | ICD-10-CM | POA: Diagnosis not present

## 2020-10-09 DIAGNOSIS — R2681 Unsteadiness on feet: Secondary | ICD-10-CM | POA: Diagnosis not present

## 2020-10-09 DIAGNOSIS — I482 Chronic atrial fibrillation, unspecified: Secondary | ICD-10-CM | POA: Diagnosis not present

## 2020-10-09 DIAGNOSIS — L8941 Pressure ulcer of contiguous site of back, buttock and hip, stage 1: Secondary | ICD-10-CM | POA: Diagnosis not present

## 2020-10-09 DIAGNOSIS — H3530 Unspecified macular degeneration: Secondary | ICD-10-CM | POA: Diagnosis not present

## 2020-10-09 DIAGNOSIS — R488 Other symbolic dysfunctions: Secondary | ICD-10-CM | POA: Diagnosis not present

## 2020-10-09 DIAGNOSIS — S82009D Unspecified fracture of unspecified patella, subsequent encounter for closed fracture with routine healing: Secondary | ICD-10-CM | POA: Diagnosis not present

## 2020-10-09 DIAGNOSIS — M810 Age-related osteoporosis without current pathological fracture: Secondary | ICD-10-CM | POA: Diagnosis not present

## 2020-10-09 DIAGNOSIS — G9341 Metabolic encephalopathy: Secondary | ICD-10-CM | POA: Diagnosis not present

## 2020-10-09 DIAGNOSIS — K56609 Unspecified intestinal obstruction, unspecified as to partial versus complete obstruction: Secondary | ICD-10-CM | POA: Diagnosis not present

## 2020-10-09 DIAGNOSIS — R279 Unspecified lack of coordination: Secondary | ICD-10-CM | POA: Diagnosis not present

## 2020-10-09 DIAGNOSIS — E440 Moderate protein-calorie malnutrition: Secondary | ICD-10-CM | POA: Diagnosis not present

## 2020-10-09 DIAGNOSIS — Z7901 Long term (current) use of anticoagulants: Secondary | ICD-10-CM | POA: Diagnosis not present

## 2020-10-09 DIAGNOSIS — Z9181 History of falling: Secondary | ICD-10-CM | POA: Diagnosis not present

## 2020-10-09 DIAGNOSIS — R5381 Other malaise: Secondary | ICD-10-CM | POA: Diagnosis not present

## 2020-10-09 DIAGNOSIS — K56699 Other intestinal obstruction unspecified as to partial versus complete obstruction: Secondary | ICD-10-CM | POA: Diagnosis not present

## 2020-10-09 DIAGNOSIS — I10 Essential (primary) hypertension: Secondary | ICD-10-CM | POA: Diagnosis not present

## 2020-10-09 DIAGNOSIS — S82001D Unspecified fracture of right patella, subsequent encounter for closed fracture with routine healing: Secondary | ICD-10-CM | POA: Diagnosis not present

## 2020-10-09 DIAGNOSIS — K560 Paralytic ileus: Secondary | ICD-10-CM | POA: Diagnosis not present

## 2020-10-09 DIAGNOSIS — K5900 Constipation, unspecified: Secondary | ICD-10-CM | POA: Diagnosis not present

## 2020-10-09 DIAGNOSIS — S82091D Other fracture of right patella, subsequent encounter for closed fracture with routine healing: Secondary | ICD-10-CM | POA: Diagnosis not present

## 2020-10-09 DIAGNOSIS — M6281 Muscle weakness (generalized): Secondary | ICD-10-CM | POA: Diagnosis not present

## 2020-10-09 LAB — SARS CORONAVIRUS 2 BY RT PCR (HOSPITAL ORDER, PERFORMED IN ~~LOC~~ HOSPITAL LAB): SARS Coronavirus 2: NEGATIVE

## 2020-10-09 MED ORDER — POLYETHYLENE GLYCOL 3350 17 G PO PACK: 17.0000 g | PACK | Freq: Every day | ORAL | 0 refills | Status: DC | PRN

## 2020-10-09 MED ORDER — SENNOSIDES-DOCUSATE SODIUM 8.6-50 MG PO TABS: 1.0000 | ORAL_TABLET | Freq: Two times a day (BID) | ORAL | Status: DC

## 2020-10-09 NOTE — TOC Transition Note (Signed)
Transition of Care [facility]) - CM/SW Discharge Note   Patient Details  Name: Paula Luna MRN: [id number] Date of Birth: 06/30/1925  [facility]) CM/SW Contact:  Maree Krabbe, LCSW Phone Number: 10/09/2020, 2:06 PM   Clinical Narrative:   Clinical Social Worker facilitated patient discharge including contacting patient family and facility to confirm patient discharge plans.  Clinical information faxed to facility and family agreeable with plan.  CSW arranged ambulance transport via [facility] to [facility] (scheduled for 4:00) .  RN to call [phone] for report prior to discharge.      Final next level of care: Skilled Nursing Facility Barriers to Discharge: No Barriers Identified   Patient Goals and CMS Choice Patient states their goals for this hospitalization and ongoing recovery are:: to get pt better   Choice offered to / list presented to : Adult Children  Discharge Placement              Patient chooses bed at:  [facility]) Patient to be transferred to facility by: [facility] Name of family member notified: daughter, [name] Patient and family notified of of transfer: 10/09/20  Discharge Plan and Services In-house Referral: NA   Post Acute Care Choice: Skilled Nursing Facility                               Social Determinants of Health (SDOH) Interventions     Readmission Risk Interventions No flowsheet data found.

## 2020-10-09 NOTE — Discharge Summary (Signed)
Physician Discharge Summary  Paula Luna JJK:[id number] DOB: 08-07-1925 DOA: 10/01/2020  PCP: Hannah Beat, MD  Admit date: 10/01/2020 Discharge date: 10/09/2020  Admitted From: 8: Home Disposition: SNF  Recommendations for Outpatient Follow-up:  1. Follow up with PCP in 1-2 weeks 2. Follow-up with orthopedics for new fracture evaluations  Home Health: No Equipment/Devices: Yes Discharge Condition: Stable CODE STATUS: DNR Diet recommendation: Heart Healthy  Brief/Interim Summary: Paula Luna a 87 y.o.femalewith PMH of HTN, A. 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 Patient feels like she has a "knot"in her upper abdomen. She has not had a "good"bowel movement in days.  ED w/up: Showed small bowel obstruction. Surgery consulted and patient admitted for further management as below.  As of 10/06/2020 patient is tolerating full liquids and diet has been advanced to soft by general surgery.  No further surgical intervention warranted.  Patient's daughter at bedside and concerned about lethargy and altered mentation.  Suspect hospital-acquired delirium.  On my discussion with the patient's daughter the patient may be too much for her to care for at home.  Notified TOC.    Patient had a BM and remained stable.  Mentation slowly improving but not quite at baseline.  Suspect multifactorial etiology.  No gross knee fracture patient will go to skilled nursing facility for intensive rehabilitation and follow-up with orthopedics post discharge.  Does not need surgical follow-up for small bowel obstruction.  Patient will discharge to skilled nursing facility in stable condition.  Strongly recommend that the patient and family seek out options regarding increased level of home  care versus long-term assisted living when patient is ready for discharge from skilled nursing.  Discharge Diagnoses:  Principal Problem:   SBO (small bowel obstruction) (HCC) Active Problems:   Essential hypertension   Atrial fibrillation (HCC)   Hypokalemia   Malnutrition of moderate degree  Small bowel obstruction Presented initially with abdominal pain, nausea, vomiting Imaging suggestive of mechanical obstruction NG tube was placed, patient self discontinued General surgery was following Diet advanced to soft and surgery signed off Seems to be tolerating p.o. No indication for parenteral nutrition SBO resolved at time of discharge Patient had a BM Discharge to skilled nursing facility Continue gentle bowel regimen Soft diet, avoid constipation  Acute encephalopathy Suspect hospital-acquired delirium No obvious infectious or metabolic etiology Daughter concerned about patient's lethargy Per nursing patient is awake most of the night Encephalopathy appears to be improving Not quite baseline at time of discharge but approaching Continue delirium precautions post discharge Recommend that patient and family seek out possibility of long-term care after time in skilled nursing  Chronic atrial fibrillation On Xarelto and Lopressor  Essential hypertension Continue oral antihypertensive regimen  Hypokalemia Secondary to GI losses Stable at time of discharge  History of right patellar fracture Cast in place Orthopedic surgery weightbearing as tolerated Follow-up with orthopedics as outpatient PT and OT evaluations Patient was deemed appropriate for discharge to skilled nursing facility.  We'll continue intensive rehabilitation and follow-up with orthopedics post discharge   Discharge Instructions  Discharge Instructions    Diet - low sodium heart healthy   Complete by: As directed    Increase activity slowly   Complete by: As directed      Allergies as of  10/09/2020      Reactions  Latex Rash   Tetanus Toxoid Anaphylaxis   REACTION: anaphylaxis   Alendronate Sodium    REACTION: passed out      Medication List    TAKE these medications   amLODipine 5 MG tablet Commonly known as: NORVASC Take 1 tablet by mouth once daily   docusate sodium 100 MG capsule Commonly known as: COLACE Take 200 mg by mouth daily.   fluticasone 50 MCG/ACT nasal spray Commonly known as: FLONASE Use 2 spray(s) in each nostril once daily   ICaps Caps Take 1 capsule by mouth 2 (two) times daily.   lisinopril 40 MG tablet Commonly known as: ZESTRIL Take 1 tablet by mouth once daily   metoprolol succinate 25 MG 24 hr tablet Commonly known as: TOPROL-XL Take 1 tablet by mouth once daily   NON FORMULARY Vitamin D 50 mcg, take one daily by mouth   polyethylene glycol 17 g packet Commonly known as: MIRALAX / GLYCOLAX Take 17 g by mouth daily as needed.   Rivaroxaban 15 MG Tabs tablet Commonly known as: Xarelto TAKE 1 TABLET BY MOUTH ONCE DAILY WITH SUPPER What changed:   how much to take  how to take this  when to take this   senna-docusate 8.6-50 MG tablet Commonly known as: Senokot-S Take 1 tablet by mouth 2 (two) times daily.   traMADol 50 MG tablet Commonly known as: ULTRAM Take 1 tablet (50 mg total) by mouth every 8 (eight) hours as needed for moderate pain.   Vitamin D 50 MCG (2000 UT) Caps Take 1 capsule by mouth daily.            Durable Medical Equipment  (From admission, onward)         Start     Ordered   10/06/20 0956  For home use only DME 3 n 1  Once        10/06/20 0955   10/06/20 0956  For home use only DME Walker  Once       Question:  Patient needs a walker to treat with the following condition  Answer:  Weakness   10/06/20 0955          Contact information for after-discharge care    Destination    [facility] .   Service: Skilled Nursing Contact information: [address] [place] [address] [phone]                 Allergies  Allergen Reactions  . Latex Rash  . Tetanus Toxoid Anaphylaxis    REACTION: anaphylaxis  . Alendronate Sodium     REACTION: passed out    Consultations:  Surgery   Procedures/Studies: CT Abdomen Pelvis W Contrast  Result Date: 10/01/2020 EXAM: CT ABDOMEN AND PELVIS WITH CONTRAST TECHNIQUE: Multidetector CT imaging of the abdomen and pelvis was performed using the standard protocol following bolus administration of intravenous contrast. CONTRAST:  72mL OMNIPAQUE IOHEXOL 300 MG/ML  SOLN COMPARISON:  None. FINDINGS: Lower chest: Lung bases are clear. Hepatobiliary: No focal hepatic lesion.  No biliary duct dilatation. Pancreas: Pancreas is normal. No ductal dilatation. No pancreatic inflammation. Spleen: Normal spleen Adrenals/urinary tract: Adrenal glands normal. Bilateral renal cortical thinning. Bladder normal Stomach/Bowel: Stomach is filled with fluid. The duodenum is fluid-filled as well as the proximal small bowel. Proximal small bowel measures 3.2 cm in diameter. The distal small bowel is collapsed measuring 0.8 cm. No portal venous gas. No intraperitoneal free air. No pneumatosis intestinalis. There is an  enteric enteric anastomosis in the central abdomen. Transition point appears to be in the vicinity of this anastomosis. (Image 54/2). Colon rectosigmoid colon are collapsed. Stool in the rectum. Gas the rectum Vascular/Lymphatic: Abdominal aorta is normal caliber with atherosclerotic calcification. There is no retroperitoneal or periportal lymphadenopathy. No pelvic lymphadenopathy. Reproductive: Post hysterectomy.  Adnexa unremarkable Other: No free fluid. Musculoskeletal: No aggressive osseous lesion. IMPRESSION: 1. Mechanical small bowel obstruction with fluid-filled stomach, duodenum, and proximal small bowel with collapsed distal small bowel. 2. Potential site of obstruction at the enteric enteric  anastomosis in the mid bowel. 3. No portal venous gas or intraperitoneal free air. No pneumatosis intestinalis. Findings conveyed toEVAN BRADLER on 10/01/2020  at15:20. Electronically Signed   By: Genevive Bi M.D.   On: 10/01/2020 15:27   DG Chest Port 1 View  Result Date: 10/02/2020 CLINICAL DATA:  Central line placement EXAM: PORTABLE CHEST 1 VIEW COMPARISON:  03/19/2017 FINDINGS: Esophageal tube tip overlies the proximal stomach. Right upper extremity central venous catheter tip over the distal SVC. Chronic elevation right diaphragm. Patchy infiltrates at the left lower lung. Stable cardiomediastinal silhouette with aortic atherosclerosis. No pneumothorax IMPRESSION: 1. Right upper extremity central venous catheter tip overlies the distal SVC. 2. Patchy infiltrates at the left base. Electronically Signed   By: Jasmine Pang M.D.   On: 10/02/2020 20:01   DG Knee Complete 4 Views Right  Result Date: 10/05/2020 CLINICAL DATA:  Patellar fracture by report 3 weeks ago. EXAM: RIGHT KNEE - COMPLETE 4+ VIEW COMPARISON:  August 26, 2020 FINDINGS: Signs of patellar fracture with some increased sclerosis along the margins of the fracture. Alignment in terms of distraction is similar to the prior exam. Signs of degenerative change in osteopenia about the knee with similar appearance. No joint effusion IMPRESSION: Signs of patellar fracture with some increased sclerosis along the margins of the fracture, compatible with subacute fracture. No change in 2-3 mm of distraction. Electronically Signed   By: Donzetta Kohut M.D.   On: 10/05/2020 11:56   DG Abd 2 Views  Result Date: 10/05/2020 CLINICAL DATA:  History of ischemic bowel disease resection for years ago. Abdominal pain, mostly on the right EXAM: ABDOMEN - 2 VIEW COMPARISON:  Two days ago FINDINGS: Favorable increase in colonic gas, especially in the ascending colon. The degree of gas distended small bowel loops on the left is not progressed and the  largest loops are seen about bowel sutures and may be postoperative. No concerning mass effect or gas collection. Airspace disease at the bases, known. IMPRESSION: Favorable increase in colonic gas. No progression of small bowel distension. Electronically Signed   By: Marnee Spring M.D.   On: 10/05/2020 07:45   DG ABD ACUTE 2+V W 1V CHEST  Result Date: 10/03/2020 CLINICAL DATA:  Small bowel obstruction. EXAM: DG ABDOMEN ACUTE WITH 1 VIEW CHEST COMPARISON:  Abdominal radiograph 10/02/2020 FINDINGS: Interval removal enteric tube. Cardiomegaly. Aortic atherosclerosis. Bibasilar airspace opacities. Redemonstrated gaseous distended loops of small bowel within the left hemiabdomen. Contrast material within the urinary bladder. Lumbar spine degenerative changes. IMPRESSION: Redemonstrated gaseous distended loops of small bowel within the left hemiabdomen compatible with small bowel obstruction. Electronically Signed   By: Annia Belt M.D.   On: 10/03/2020 08:05   DG Abd Portable 1 View  Result Date: 10/01/2020 CLINICAL DATA:  NG tube placed EXAM: PORTABLE ABDOMEN - 1 VIEW COMPARISON:  None. FINDINGS: NG tube extends into the stomach.  Side port at the GE junction. IMPRESSION: NG tube  in stomach. Electronically Signed   By: Genevive BiStewart  Edmunds M.D.   On: 10/01/2020 18:26   DG Abd Portable 2V  Result Date: 10/02/2020 CLINICAL DATA:  Small bowel obstruction. EXAM: PORTABLE ABDOMEN - 2 VIEW COMPARISON:  October 01, 2020. FINDINGS: Dilated small bowel loops are seen in the left side of the abdomen concerning for small bowel obstruction. Distal tip of nasogastric tube is seen in proximal stomach. IMPRESSION: Dilated small bowel loops are noted in left side of the abdomen concerning for small bowel obstruction. Electronically Signed   By: Lupita RaiderJames  Green Jr M.D.   On: 10/02/2020 09:11   US EKG SITE RITE  Result Date: 10/02/2020 If Site Rite image not attached, placement could not be confirmed due to current cardiac  rhythm.   (Echo, Carotid, EGD, Colonoscopy, ERCP)    Subjective: Seen and examined on the day of discharge.  Sitting up in bed.  Little confused but overall stable.  Stable for discharge to skilled nursing.  Discharge Exam: Vitals:   10/09/20 0905 10/09/20 1132  BP:  132/74  Pulse: 75 (!) 59  Resp: 18 20  Temp: 97.8 F (36.6 C) 98.4 F (36.9 C)  SpO2: 97% 98%   Vitals:   10/09/20 0421 10/09/20 0453 10/09/20 0905 10/09/20 1132  BP:  (!) 143/78  132/74  Pulse:  70 75 (!) 59  Resp:  16 18 20   Temp:  98.4 F (36.9 C) 97.8 F (36.6 C) 98.4 F (36.9 C)  TempSrc:  Oral Oral Oral  SpO2:  97% 97% 98%  Weight: 54.9 kg     Height:        General: Pt is alert, awake, not in acute distress Cardiovascular: RRR, S1/S2 +, no rubs, no gallops Respiratory: CTA bilaterally, no wheezing, no rhonchi Abdominal: Soft, NT, ND, bowel sounds + Extremities: no edema, no cyanosis    The results of significant diagnostics from this hospitalization (including imaging, microbiology, ancillary and laboratory) are listed below for reference.     Microbiology: Recent Results (from the past 240 hour(s))  SARS Coronavirus 2 by RT PCR (hospital order, performed [facility]) Nasopharyngeal Nasopharyngeal Swab     Status: None   Collection Time: 10/01/20  3:27 PM   Specimen: Nasopharyngeal Swab  Result Value Ref Range Status   SARS Coronavirus 2 NEGATIVE NEGATIVE Final    Comment: (NOTE) SARS-CoV-2 target nucleic acids are NOT DETECTED.  The SARS-CoV-2 RNA is generally detectable in upper and lower respiratory specimens during the acute phase of infection. The lowest concentration of SARS-CoV-2 viral copies this assay can detect is 250 copies / mL. A negative result does not preclude SARS-CoV-2 infection and should not be used as the sole basis for treatment or other patient management decisions.  A negative result may occur with improper specimen collection / handling,  submission of specimen other than nasopharyngeal swab, presence of viral mutation(s) within the areas targeted by this assay, and inadequate number of viral copies (<250 copies / mL). A negative result must be combined with clinical observations, patient history, and epidemiological information.  Fact Sheet for Patients:   BoilerBrush.com.cyhttps://www.fda.gov/media/136312/download  Fact Sheet for Healthcare Providers: https://pope.com/https://www.fda.gov/media/136313/download  This test is not yet approved or  cleared by the [place] FDA and has been authorized for detection and/or diagnosis of SARS-CoV-2 by FDA under an Emergency Use Authorization (EUA).  This EUA will remain in effect (meaning this test can be used) for the duration of the COVID-19 declaration under Section 564(b)(1)  of the Act, 21 U.S.C. section 360bbb-3(b)(1), unless the authorization is terminated or revoked sooner.  Performed at [facility], [address] [place], [place] [address]   SARS Coronavirus 2 by RT PCR (hospital order, performed in [facility] hospital lab) Nasopharyngeal Nasopharyngeal Swab     Status: None   Collection Time: 10/09/20 12:01 PM   Specimen: Nasopharyngeal Swab  Result Value Ref Range Status   SARS Coronavirus 2 NEGATIVE NEGATIVE Final    Comment: (NOTE) SARS-CoV-2 target nucleic acids are NOT DETECTED.  The SARS-CoV-2 RNA is generally detectable in upper and lower respiratory specimens during the acute phase of infection. The lowest concentration of SARS-CoV-2 viral copies this assay can detect is 250 copies / mL. A negative result does not preclude SARS-CoV-2 infection and should not be used as the sole basis for treatment or other patient management decisions.  A negative result may occur with improper specimen collection / handling, submission of specimen other than nasopharyngeal swab, presence of viral mutation(s) within the areas targeted by this assay, and inadequate number of viral  copies (<250 copies / mL). A negative result must be combined with clinical observations, patient history, and epidemiological information.  Fact Sheet for Patients:   BoilerBrush.com.cy  Fact Sheet for Healthcare Providers: https://pope.com/  This test is not yet approved or  cleared by the [place] FDA and has been authorized for detection and/or diagnosis of SARS-CoV-2 by FDA under an Emergency Use Authorization (EUA).  This EUA will remain in effect (meaning this test can be used) for the duration of the COVID-19 declaration under Section 564(b)(1) of the Act, 21 U.S.C. section 360bbb-3(b)(1), unless the authorization is terminated or revoked sooner.  Performed at [facility], [address] [place], [place] [address]      Labs: BNP (last 3 results) No results for input(s): BNP in the last 8760 hours. Basic Metabolic Panel: Recent Labs  Lab 10/03/20 0453 10/04/20 0657 10/05/20 0554 10/06/20 0536 10/07/20 0531 10/08/20 0538  NA 147* 142 142 141 140 138  K 3.9 3.3* 3.3* 3.5 3.3* 3.7  CL 107 106 106 107 104 104  CO2 27 27 27 25 25 27   GLUCOSE 197* 141* 138* 113* 91 115*  BUN 24* 14 9 8 15 21   CREATININE 0.79 0.66 0.64 0.52 0.78 0.91  CALCIUM 9.5 8.7* 8.4* 8.6* 8.9 8.7*  MG 1.9 1.7 1.6* 2.3  --   --   PHOS 2.4* 2.3* 3.2 2.7  --   --    Liver Function Tests: Recent Labs  Lab 10/05/20 0554 10/08/20 0003 10/08/20 0538  AST  --  20  --   ALT  --  19  --   ALKPHOS  --  68  --   BILITOT  --  0.5  --   PROT  --  5.7*  --   ALBUMIN 2.6* 2.8* 2.7*   No results for input(s): LIPASE, AMYLASE in the last 168 hours. No results for input(s): AMMONIA in the last 168 hours. CBC: Recent Labs  Lab 10/04/20 0657 10/05/20 0554 10/06/20 0536 10/07/20 0531 10/08/20 0003  WBC 8.2 5.9 6.4 8.1 9.6  HGB 11.0* 12.2 13.1 12.5 12.0  HCT 33.2* 36.5 40.2 38.1 36.9  MCV 78.5* 76.5* 77.5* 78.1* 77.8*  PLT 169 182  206 215 223   Cardiac Enzymes: No results for input(s): CKTOTAL, CKMB, CKMBINDEX, TROPONINI in the last 168 hours. BNP: Invalid input(s): POCBNP CBG: No results for input(s): GLUCAP in the last  168 hours. D-Dimer No results for input(s): DDIMER in the last 72 hours. Hgb A1c No results for input(s): HGBA1C in the last 72 hours. Lipid Profile No results for input(s): CHOL, HDL, LDLCALC, TRIG, CHOLHDL, LDLDIRECT in the last 72 hours. Thyroid function studies No results for input(s): TSH, T4TOTAL, T3FREE, THYROIDAB in the last 72 hours.  Invalid input(s): FREET3 Anemia work up No results for input(s): VITAMINB12, FOLATE, FERRITIN, TIBC, IRON, RETICCTPCT in the last 72 hours. Urinalysis    Component Value Date/Time   COLORURINE YELLOW (A) 10/01/2020 1211   APPEARANCEUR HAZY (A) 10/01/2020 1211   LABSPEC 1.020 10/01/2020 1211   PHURINE 5.0 10/01/2020 1211   GLUCOSEU NEGATIVE 10/01/2020 1211   HGBUR SMALL (A) 10/01/2020 1211   BILIRUBINUR NEGATIVE 10/01/2020 1211   KETONESUR 20 (A) 10/01/2020 1211   PROTEINUR 30 (A) 10/01/2020 1211   NITRITE NEGATIVE 10/01/2020 1211   LEUKOCYTESUR NEGATIVE 10/01/2020 1211   Sepsis Labs Invalid input(s): PROCALCITONIN,  WBC,  LACTICIDVEN Microbiology Recent Results (from the past 240 hour(s))  SARS Coronavirus 2 by RT PCR (hospital order, performed in [facility] hospital lab) Nasopharyngeal Nasopharyngeal Swab     Status: None   Collection Time: 10/01/20  3:27 PM   Specimen: Nasopharyngeal Swab  Result Value Ref Range Status   SARS Coronavirus 2 NEGATIVE NEGATIVE Final    Comment: (NOTE) SARS-CoV-2 target nucleic acids are NOT DETECTED.  The SARS-CoV-2 RNA is generally detectable in upper and lower respiratory specimens during the acute phase of infection. The lowest concentration of SARS-CoV-2 viral copies this assay can detect is 250 copies / mL. A negative result does not preclude SARS-CoV-2 infection and should not be used as the sole  basis for treatment or other patient management decisions.  A negative result may occur with improper specimen collection / handling, submission of specimen other than nasopharyngeal swab, presence of viral mutation(s) within the areas targeted by this assay, and inadequate number of viral copies (<250 copies / mL). A negative result must be combined with clinical observations, patient history, and epidemiological information.  Fact Sheet for Patients:   BoilerBrush.com.cy  Fact Sheet for Healthcare Providers: https://pope.com/  This test is not yet approved or  cleared by the [place] FDA and has been authorized for detection and/or diagnosis of SARS-CoV-2 by FDA under an Emergency Use Authorization (EUA).  This EUA will remain in effect (meaning this test can be used) for the duration of the COVID-19 declaration under Section 564(b)(1) of the Act, 21 U.S.C. section 360bbb-3(b)(1), unless the authorization is terminated or revoked sooner.  Performed at [facility], [address] [place], [place] [address]   SARS Coronavirus 2 by RT PCR (hospital order, performed in [facility] hospital lab) Nasopharyngeal Nasopharyngeal Swab     Status: None   Collection Time: 10/09/20 12:01 PM   Specimen: Nasopharyngeal Swab  Result Value Ref Range Status   SARS Coronavirus 2 NEGATIVE NEGATIVE Final    Comment: (NOTE) SARS-CoV-2 target nucleic acids are NOT DETECTED.  The SARS-CoV-2 RNA is generally detectable in upper and lower respiratory specimens during the acute phase of infection. The lowest concentration of SARS-CoV-2 viral copies this assay can detect is 250 copies / mL. A negative result does not preclude SARS-CoV-2 infection and should not be used as the sole basis for treatment or other patient management decisions.  A negative result may occur with improper specimen collection / handling, submission of specimen  other than nasopharyngeal swab, presence of viral mutation(s) within  the areas targeted by this assay, and inadequate number of viral copies (<250 copies / mL). A negative result must be combined with clinical observations, patient history, and epidemiological information.  Fact Sheet for Patients:   BoilerBrush.com.cy  Fact Sheet for Healthcare Providers: https://pope.com/  This test is not yet approved or  cleared by the [place] FDA and has been authorized for detection and/or diagnosis of SARS-CoV-2 by FDA under an Emergency Use Authorization (EUA).  This EUA will remain in effect (meaning this test can be used) for the duration of the COVID-19 declaration under Section 564(b)(1) of the Act, 21 U.S.C. section 360bbb-3(b)(1), unless the authorization is terminated or revoked sooner.  Performed at [facility], [address] [place], [place] [address]      Time coordinating discharge: Over 30 minutes  SIGNED:   Tresa Moore, MD  [facility] 10/09/2020, 2:07 PM Pager   If 7PM-7AM, please contact night-coverage

## 2020-10-09 NOTE — TOC Progression Note (Signed)
Transition of Care [facility]) - Progression Note    Patient Details  Name: Paula Luna MRN: [id number] Date of Birth: 1925-03-07  [facility] CM/SW Contact  Maree Krabbe, LCSW Phone Number: 10/09/2020, 10:28 AM  Clinical Narrative:   Pt's daughter requesting SNF stating pt is too much for her to handle.    Expected Discharge Plan: Skilled Nursing Facility Barriers to Discharge: Continued Medical Work up  Expected Discharge Plan and Services Expected Discharge Plan: Skilled Nursing Facility In-house Referral: NA   Post Acute Care Choice: Skilled Nursing Facility Living arrangements for the past 2 months: Single Family Home                                       Social Determinants of Health (SDOH) Interventions    Readmission Risk Interventions No flowsheet data found.

## 2020-10-09 NOTE — Progress Notes (Signed)
Call back from receiving RN at [facility], report given verbally.

## 2020-10-09 NOTE — TOC Progression Note (Addendum)
Transition of Care [facility]) - Progression Note    Patient Details  Name: SAVANNHA WELLE MRN: [id number] Date of Birth: Nov 30, 1924  Transition of Care [facility]) CM/SW Contact  Maree Krabbe, LCSW Phone Number: 10/09/2020, 1:56 PM  Clinical Narrative:    [facility] for SNF denied, HTA offering peer to peer information provided to MD.  Peer to Peer completed and auth approved. Pt will dc to [place] today.  Expected Discharge Plan: Skilled Nursing Facility Barriers to Discharge: Continued Medical Work up  Expected Discharge Plan and Services Expected Discharge Plan: Skilled Nursing Facility In-house Referral: NA   Post Acute Care Choice: Skilled Nursing Facility Living arrangements for the past 2 months: Single Family Home                                       Social Determinants of Health (SDOH) Interventions    Readmission Risk Interventions No flowsheet data found.

## 2020-10-09 NOTE — Progress Notes (Signed)
Occupational Therapy Treatment Patient Details Name: Paula Luna MRN: [id number] DOB: February 24, 1925 Today's Date: 10/09/2020    History of present illness Per MD notes: Pt is a 87 y.o. female with medical history significant for hypertension, history of A. 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  Pt also with orders for RLE WBAT per Dr. Odis Luster with KI donned at all times except for hygiene. MD assessment includes: Small bowel obstruction, Hypokalemia, agitation, and Hypophosphatemia.   OT comments  Upon entering the room, pt supine in bed and agreeable to OT intervention. Pt requesting assistance to "clean up" because she will be leaving the hospital. OT set up basin and pt needing mod A for supine >sit to EOB. Pt washing UB and L LE while seated on EOB with set up A. Pt returning to supine to wash peri area with set up. Pt rolls with min A and needs assistance to wash buttocks. Hospital gown donned with min A and purewick placed. Pt needing mod multimodal cuing throughout session to attend to task and sequencing. Pt repositioned in bed for comfort with all needs within reach.    Follow Up Recommendations  SNF    Equipment Recommendations  Other (comment)       Precautions / Restrictions Precautions Precautions: Fall Required Braces or Orthoses: Knee Immobilizer - Right Knee Immobilizer - Right: On at all times;Other (comment) Restrictions Weight Bearing Restrictions: Yes Other Position/Activity Restrictions: No R knee ROM exercises       Mobility Bed Mobility Overal bed mobility: Needs Assistance Bed Mobility: Supine to Sit;Sit to Supine     Supine to sit: Mod assist Sit to supine: Mod assist   General bed mobility comments: assist with B LEs with min cuing for  proper technique       Balance Overall balance assessment: Needs assistance Sitting-balance support: Feet supported Sitting balance-Leahy Scale: Good                                     ADL either performed or assessed with clinical judgement   ADL Overall ADL's : Needs assistance/impaired         Upper Body Bathing: Set up;Sitting   Lower Body Bathing: Moderate assistance;Sitting/lateral leans;Bed level   Upper Body Dressing : Minimal assistance;Sitting Upper Body Dressing Details (indicate cue type and reason): to don hospital gown                         Vision Baseline Vision/History: No visual deficits Patient Visual Report: No change from baseline            Cognition Arousal/Alertness: Awake/alert Behavior During Therapy: WFL for tasks assessed/performed Overall Cognitive Status: Within Functional Limits for tasks assessed                                 General Comments: Pt continues to need mod multimodal cuing for redirection. She is hyperfocused on transfer to SNF this afternoon.                   Pertinent Vitals/ Pain       Pain Assessment: Faces Faces  Pain Scale: Hurts little more Pain Location: R knee Pain Descriptors / Indicators: Discomfort;Guarding Pain Intervention(s): Limited activity within patient's tolerance;Repositioned         Frequency  Min 1X/week        Progress Toward Goals  OT Goals(current goals can now be found in the care plan section)  Progress towards OT goals: Progressing toward goals  Acute Rehab OT Goals Patient Stated Goal: " I got to get ready to leave" OT Goal Formulation: With patient Time For Goal Achievement: 10/19/20 Potential to Achieve Goals: Fair  Plan Discharge plan needs to be updated       AM-PAC OT "6 Clicks" Daily Activity     Outcome Measure   Help from another person eating meals?: A Little Help from another person taking care of personal  grooming?: A Little Help from another person toileting, which includes using toliet, bedpan, or urinal?: A Lot Help from another person bathing (including washing, rinsing, drying)?: A Lot Help from another person to put on and taking off regular upper body clothing?: A Little Help from another person to put on and taking off regular lower body clothing?: A Lot 6 Click Score: 15    End of Session    OT Visit Diagnosis: Unsteadiness on feet (R26.81);Muscle weakness (generalized) (M62.81)   Activity Tolerance Patient limited by fatigue   Patient Left in bed;with call bell/phone within reach;with bed alarm set   Nurse Communication Mobility status;Precautions        Time: 5643-3295 OT Time Calculation (min): 39 min  Charges: OT General Charges $OT Visit: 1 Visit OT Treatments $Self Care/Home Management : 38-52 mins  Jackquline Denmark, MS, OTR/L , CBIS ascom [phone]  10/09/20, 3:06 PM

## 2020-10-09 NOTE — Progress Notes (Signed)
Attempted to call [facility] for report x3, unable to get in contact with receiving RN. Left message with front desk.

## 2020-10-12 DIAGNOSIS — K56609 Unspecified intestinal obstruction, unspecified as to partial versus complete obstruction: Secondary | ICD-10-CM | POA: Diagnosis not present

## 2020-10-12 DIAGNOSIS — I482 Chronic atrial fibrillation, unspecified: Secondary | ICD-10-CM | POA: Diagnosis not present

## 2020-10-12 DIAGNOSIS — R5381 Other malaise: Secondary | ICD-10-CM | POA: Diagnosis not present

## 2020-10-12 DIAGNOSIS — S82091D Other fracture of right patella, subsequent encounter for closed fracture with routine healing: Secondary | ICD-10-CM | POA: Diagnosis not present

## 2020-10-13 DIAGNOSIS — I10 Essential (primary) hypertension: Secondary | ICD-10-CM | POA: Diagnosis not present

## 2020-10-13 DIAGNOSIS — K5900 Constipation, unspecified: Secondary | ICD-10-CM | POA: Diagnosis not present

## 2020-10-13 DIAGNOSIS — K560 Paralytic ileus: Secondary | ICD-10-CM | POA: Diagnosis not present

## 2020-10-13 DIAGNOSIS — R2681 Unsteadiness on feet: Secondary | ICD-10-CM | POA: Diagnosis not present

## 2020-10-13 DIAGNOSIS — I4891 Unspecified atrial fibrillation: Secondary | ICD-10-CM | POA: Diagnosis not present

## 2020-10-13 DIAGNOSIS — E440 Moderate protein-calorie malnutrition: Secondary | ICD-10-CM | POA: Diagnosis not present

## 2020-10-13 DIAGNOSIS — M6281 Muscle weakness (generalized): Secondary | ICD-10-CM | POA: Diagnosis not present

## 2020-10-13 DIAGNOSIS — S82009D Unspecified fracture of unspecified patella, subsequent encounter for closed fracture with routine healing: Secondary | ICD-10-CM | POA: Diagnosis not present

## 2020-10-14 DIAGNOSIS — Z87891 Personal history of nicotine dependence: Secondary | ICD-10-CM | POA: Diagnosis not present

## 2020-10-14 DIAGNOSIS — Z7951 Long term (current) use of inhaled steroids: Secondary | ICD-10-CM | POA: Diagnosis not present

## 2020-10-14 DIAGNOSIS — M810 Age-related osteoporosis without current pathological fracture: Secondary | ICD-10-CM | POA: Diagnosis not present

## 2020-10-14 DIAGNOSIS — S82001D Unspecified fracture of right patella, subsequent encounter for closed fracture with routine healing: Secondary | ICD-10-CM | POA: Diagnosis not present

## 2020-10-14 DIAGNOSIS — I4821 Permanent atrial fibrillation: Secondary | ICD-10-CM | POA: Diagnosis not present

## 2020-10-14 DIAGNOSIS — Z9181 History of falling: Secondary | ICD-10-CM | POA: Diagnosis not present

## 2020-10-14 DIAGNOSIS — Z7901 Long term (current) use of anticoagulants: Secondary | ICD-10-CM | POA: Diagnosis not present

## 2020-10-14 DIAGNOSIS — L8941 Pressure ulcer of contiguous site of back, buttock and hip, stage 1: Secondary | ICD-10-CM | POA: Diagnosis not present

## 2020-10-14 DIAGNOSIS — I10 Essential (primary) hypertension: Secondary | ICD-10-CM | POA: Diagnosis not present

## 2020-10-14 DIAGNOSIS — H3530 Unspecified macular degeneration: Secondary | ICD-10-CM | POA: Diagnosis not present

## 2020-10-20 ENCOUNTER — Telehealth: Payer: Self-pay | Admitting: Family Medicine

## 2020-10-20 NOTE — Telephone Encounter (Signed)
I do not know who or where the toilet seat was ordered from.  I do not see a DME order in chart so I am not sure if this was ordered while patient was in hospital.

## 2020-10-20 NOTE — Telephone Encounter (Signed)
Daughter called and stated that mom is still in rehab for broken kneecap at [facility] and she received a phone call from the place that ordered her toilet seat. And she doesn't have the phone number, and she is not going to need it right now due to she is in rehab.

## 2020-10-20 NOTE — Telephone Encounter (Signed)
I do not know how to respond. I did not order a toilet seat for her. I suspect that the inpatient team did.

## 2020-10-21 NOTE — Telephone Encounter (Addendum)
Spoke with Paula Luna and advised I didn't see that we placed an order for a toilet seat so I don't know which medical supply company contacted her.  She said she tried to call them back when she got home from work that evening and they were already closed and now she has deleted the number.  I advised they will definitely call her back and she can advise them it is not currently needed.

## 2020-10-27 DIAGNOSIS — K560 Paralytic ileus: Secondary | ICD-10-CM | POA: Diagnosis not present

## 2020-10-27 DIAGNOSIS — S82009D Unspecified fracture of unspecified patella, subsequent encounter for closed fracture with routine healing: Secondary | ICD-10-CM | POA: Diagnosis not present

## 2020-10-27 DIAGNOSIS — I4891 Unspecified atrial fibrillation: Secondary | ICD-10-CM | POA: Diagnosis not present

## 2020-10-27 DIAGNOSIS — R2681 Unsteadiness on feet: Secondary | ICD-10-CM | POA: Diagnosis not present

## 2020-10-27 DIAGNOSIS — M6281 Muscle weakness (generalized): Secondary | ICD-10-CM | POA: Diagnosis not present

## 2020-10-27 DIAGNOSIS — I10 Essential (primary) hypertension: Secondary | ICD-10-CM | POA: Diagnosis not present

## 2020-10-27 DIAGNOSIS — E440 Moderate protein-calorie malnutrition: Secondary | ICD-10-CM | POA: Diagnosis not present

## 2020-10-27 DIAGNOSIS — K5900 Constipation, unspecified: Secondary | ICD-10-CM | POA: Diagnosis not present

## 2020-10-28 DIAGNOSIS — I482 Chronic atrial fibrillation, unspecified: Secondary | ICD-10-CM | POA: Diagnosis not present

## 2020-10-28 DIAGNOSIS — I10 Essential (primary) hypertension: Secondary | ICD-10-CM | POA: Diagnosis not present

## 2020-10-28 DIAGNOSIS — R5381 Other malaise: Secondary | ICD-10-CM | POA: Diagnosis not present

## 2020-10-28 DIAGNOSIS — K56609 Unspecified intestinal obstruction, unspecified as to partial versus complete obstruction: Secondary | ICD-10-CM | POA: Diagnosis not present

## 2020-10-29 DIAGNOSIS — I10 Essential (primary) hypertension: Secondary | ICD-10-CM | POA: Diagnosis not present

## 2020-10-29 DIAGNOSIS — S82009D Unspecified fracture of unspecified patella, subsequent encounter for closed fracture with routine healing: Secondary | ICD-10-CM | POA: Diagnosis not present

## 2020-10-29 DIAGNOSIS — K560 Paralytic ileus: Secondary | ICD-10-CM | POA: Diagnosis not present

## 2020-10-29 DIAGNOSIS — E440 Moderate protein-calorie malnutrition: Secondary | ICD-10-CM | POA: Diagnosis not present

## 2020-10-29 DIAGNOSIS — K5900 Constipation, unspecified: Secondary | ICD-10-CM | POA: Diagnosis not present

## 2020-10-29 DIAGNOSIS — I4891 Unspecified atrial fibrillation: Secondary | ICD-10-CM | POA: Diagnosis not present

## 2020-10-29 DIAGNOSIS — R2681 Unsteadiness on feet: Secondary | ICD-10-CM | POA: Diagnosis not present

## 2020-10-29 DIAGNOSIS — M6281 Muscle weakness (generalized): Secondary | ICD-10-CM | POA: Diagnosis not present

## 2020-10-30 DIAGNOSIS — H3530 Unspecified macular degeneration: Secondary | ICD-10-CM | POA: Diagnosis not present

## 2020-10-30 DIAGNOSIS — Z9181 History of falling: Secondary | ICD-10-CM | POA: Diagnosis not present

## 2020-10-30 DIAGNOSIS — S82014A Nondisplaced osteochondral fracture of right patella, initial encounter for closed fracture: Secondary | ICD-10-CM | POA: Diagnosis not present

## 2020-10-30 DIAGNOSIS — Z7951 Long term (current) use of inhaled steroids: Secondary | ICD-10-CM | POA: Diagnosis not present

## 2020-10-30 DIAGNOSIS — I4821 Permanent atrial fibrillation: Secondary | ICD-10-CM | POA: Diagnosis not present

## 2020-10-30 DIAGNOSIS — Z87891 Personal history of nicotine dependence: Secondary | ICD-10-CM | POA: Diagnosis not present

## 2020-10-30 DIAGNOSIS — S82001D Unspecified fracture of right patella, subsequent encounter for closed fracture with routine healing: Secondary | ICD-10-CM | POA: Diagnosis not present

## 2020-10-30 DIAGNOSIS — L8941 Pressure ulcer of contiguous site of back, buttock and hip, stage 1: Secondary | ICD-10-CM | POA: Diagnosis not present

## 2020-10-30 DIAGNOSIS — Z7901 Long term (current) use of anticoagulants: Secondary | ICD-10-CM | POA: Diagnosis not present

## 2020-10-30 DIAGNOSIS — I10 Essential (primary) hypertension: Secondary | ICD-10-CM | POA: Diagnosis not present

## 2020-10-30 DIAGNOSIS — M810 Age-related osteoporosis without current pathological fracture: Secondary | ICD-10-CM | POA: Diagnosis not present

## 2020-11-04 ENCOUNTER — Other Ambulatory Visit: Payer: Self-pay

## 2020-11-04 ENCOUNTER — Ambulatory Visit (INDEPENDENT_AMBULATORY_CARE_PROVIDER_SITE_OTHER): Payer: PPO | Admitting: Family Medicine

## 2020-11-04 ENCOUNTER — Encounter: Payer: Self-pay | Admitting: Family Medicine

## 2020-11-04 VITALS — BP 102/60 | HR 69 | Temp 98.7°F | Ht 58.6 in | Wt 123.2 lb

## 2020-11-04 DIAGNOSIS — R600 Localized edema: Secondary | ICD-10-CM | POA: Diagnosis not present

## 2020-11-04 DIAGNOSIS — L918 Other hypertrophic disorders of the skin: Secondary | ICD-10-CM

## 2020-11-04 DIAGNOSIS — E440 Moderate protein-calorie malnutrition: Secondary | ICD-10-CM

## 2020-11-04 DIAGNOSIS — R0602 Shortness of breath: Secondary | ICD-10-CM

## 2020-11-04 DIAGNOSIS — K56609 Unspecified intestinal obstruction, unspecified as to partial versus complete obstruction: Secondary | ICD-10-CM | POA: Diagnosis not present

## 2020-11-04 DIAGNOSIS — F05 Delirium due to known physiological condition: Secondary | ICD-10-CM | POA: Diagnosis not present

## 2020-11-04 NOTE — Progress Notes (Signed)
Paula Luna T. Laren Whaling, MD, CAQ Sports Medicine  Primary Care and Sports Medicine [facility] [address] [place], [id number]  Phone: [phone]  FAX: [phone]  Paula CornsRoxanne D Luna - 87 y.o. female  MRN [id number]  Date of Birth: 12-06-1924  Date: 11/04/2020  PCP: Paula Luna, Paula Apperson, MD  Referral: Paula Luna, Paula Deupree, MD  Chief Complaint  Patient presents with  . Hospitalization Follow-up    [facility] Discharge    This visit occurred during the SARS-CoV-2 public health emergency.  Safety protocols were in place, including screening questions prior to the visit, additional usage of staff PPE, and extensive cleaning of exam room while observing appropriate contact time as indicated for disinfecting solutions.   Subjective:   Paula CornsRoxanne D Luna is a 87 y.o. very pleasant female patient who presents with the following:  She is here for hospital follow-up and SnF follow-up:  F/u SBO.  She was admitted and followed clinically, and was followed by general surgery, and thankfully her bowel obstruction resolved without any form of surgical intervention. D/c to snf secondary to debility, home status, and recovery post-hosp  Patellar fraction: She has a immobilizer in place - knee, known fx, followed by Dr. Lerry PatersonGiofre.  She is still using a walker, but at this point per her report to me her fracture is healed.  She and her daughter have concerns about her in the home place when her daughter is at work.  She is also having a great deal of difficulty doing some of the in-home care with her in general.  She does have a standing order for home health in place, but we do need to reestablish this.  F/u labs Home care and hh in place?  Swollen legs -b  Skin Tag: R neck -he she does have a question about a skin tag on her anterior neck.  Admit date: 10/01/2020 Discharge date: 10/09/2020  Admitted From: 8: Home Disposition: SNF  Recommendations for  Outpatient Follow-up:  1. Follow up with PCP in 1-2 weeks 2. Follow-up with orthopedics for new fracture evaluations   Review of Systems is noted in the HPI, as appropriate  Objective:   BP 102/60   Pulse 69   Temp 98.7 F (37.1 C) (Temporal)   Ht 4' 10.6" (1.488 m)   Wt 123 lb 4 oz (55.9 kg)   SpO2 99%   BMI 25.23 kg/m   GEN: WDWN, NAD, Non-toxic HEENT: Atraumatic, Normocephalic. Neck supple. No masses. CV: RRR, No M/G/R. No JVD. No thrill. No extra heart sounds. PULM: CTA B, no wheezes, crackles, rhonchi. No retractions. No resp. distress. No accessory muscle use. EXTR: No c/c/1+ pitting edema of bilateral lower extremities. NEURO Normal gait.  PSYCH: Normally interactive. Conversant.   Laboratory and Imaging Data: CT Abdomen Pelvis W Contrast  Result Date: 10/01/2020 EXAM: CT ABDOMEN AND PELVIS WITH CONTRAST TECHNIQUE: Multidetector CT imaging of the abdomen and pelvis was performed using the standard protocol following bolus administration of intravenous contrast. CONTRAST:  75mL OMNIPAQUE IOHEXOL 300 MG/ML  SOLN COMPARISON:  None. FINDINGS: Lower chest: Lung bases are clear. Hepatobiliary: No focal hepatic lesion.  No biliary duct dilatation. Pancreas: Pancreas is normal. No ductal dilatation. No pancreatic inflammation. Spleen: Normal spleen Adrenals/urinary tract: Adrenal glands normal. Bilateral renal cortical thinning. Bladder normal Stomach/Bowel: Stomach is filled with fluid. The duodenum is fluid-filled as well as the proximal small bowel. Proximal small bowel measures 3.2 cm in diameter. The distal small bowel is collapsed  measuring 0.8 cm. No portal venous gas. No intraperitoneal free air. No pneumatosis intestinalis. There is an enteric enteric anastomosis in the central abdomen. Transition point appears to be in the vicinity of this anastomosis. (Image 54/2). Colon rectosigmoid colon are collapsed. Stool in the rectum. Gas the rectum Vascular/Lymphatic: Abdominal aorta  is normal caliber with atherosclerotic calcification. There is no retroperitoneal or periportal lymphadenopathy. No pelvic lymphadenopathy. Reproductive: Post hysterectomy.  Adnexa unremarkable Other: No free fluid. Musculoskeletal: No aggressive osseous lesion. IMPRESSION: 1. Mechanical small bowel obstruction with fluid-filled stomach, duodenum, and proximal small bowel with collapsed distal small bowel. 2. Potential site of obstruction at the enteric enteric anastomosis in the mid bowel. 3. No portal venous gas or intraperitoneal free air. No pneumatosis intestinalis. Findings conveyed toEVAN Luna on 10/01/2020  at15:20. Electronically Signed   By: Paula Luna M.D.   On: 10/01/2020 15:27   DG Chest Port 1 View  Result Date: 10/02/2020 CLINICAL DATA:  Central line placement EXAM: PORTABLE CHEST 1 VIEW COMPARISON:  03/19/2017 FINDINGS: Esophageal tube tip overlies the proximal stomach. Right upper extremity central venous catheter tip over the distal SVC. Chronic elevation right diaphragm. Patchy infiltrates at the left lower lung. Stable cardiomediastinal silhouette with aortic atherosclerosis. No pneumothorax IMPRESSION: 1. Right upper extremity central venous catheter tip overlies the distal SVC. 2. Patchy infiltrates at the left base. Electronically Signed   By: Paula Luna M.D.   On: 10/02/2020 20:01   DG Abd Portable 1 View  Result Date: 10/01/2020 CLINICAL DATA:  NG tube placed EXAM: PORTABLE ABDOMEN - 1 VIEW COMPARISON:  None. FINDINGS: NG tube extends into the stomach.  Side port at the GE junction. IMPRESSION: NG tube in stomach. Electronically Signed   By: Paula Luna M.D.   On: 10/01/2020 18:26   DG Abd Portable 2V  Result Date: 10/02/2020 CLINICAL DATA:  Small bowel obstruction. EXAM: PORTABLE ABDOMEN - 2 VIEW COMPARISON:  October 01, 2020. FINDINGS: Dilated small bowel loops are seen in the left side of the abdomen concerning for small bowel obstruction. Distal tip of  nasogastric tube is seen in proximal stomach. IMPRESSION: Dilated small bowel loops are noted in left side of the abdomen concerning for small bowel obstruction. Electronically Signed   By: Lupita Raider M.D.   On: 10/02/2020 09:11   [place] EKG SITE RITE  Result Date: 10/02/2020 If Site Rite image not attached, placement could not be confirmed due to current cardiac rhythm.  Results for orders placed or performed during the hospital encounter of 10/01/20  SARS Coronavirus 2 by RT PCR (hospital order, performed in [facility] hospital lab) Nasopharyngeal Nasopharyngeal Swab   Specimen: Nasopharyngeal Swab  Result Value Ref Range   SARS Coronavirus 2 NEGATIVE NEGATIVE  SARS Coronavirus 2 by RT PCR (hospital order, performed in [facility] hospital lab) Nasopharyngeal Nasopharyngeal Swab   Specimen: Nasopharyngeal Swab  Result Value Ref Range   SARS Coronavirus 2 NEGATIVE NEGATIVE  Lipase, blood  Result Value Ref Range   Lipase 49 11 - 51 U/L  Comprehensive metabolic panel  Result Value Ref Range   Sodium 139 135 - 145 mmol/L   Potassium 3.3 (L) 3.5 - 5.1 mmol/L   Chloride 97 (L) 98 - 111 mmol/L   CO2 27 22 - 32 mmol/L   Glucose, Bld 131 (H) 70 - 99 mg/dL   BUN 19 8 - 23 mg/dL   Creatinine, Ser 0.25 0.44 - 1.00 mg/dL   Calcium 9.5 8.9 - 42.7 mg/dL  Total Protein 6.8 6.5 - 8.1 g/dL   Albumin 3.5 3.5 - 5.0 g/dL   AST 19 15 - 41 U/L   ALT 12 0 - 44 U/L   Alkaline Phosphatase 63 38 - 126 U/L   Total Bilirubin 1.0 0.3 - 1.2 mg/dL   GFR, Estimated >69 >62 mL/min   Anion gap 15 5 - 15  CBC  Result Value Ref Range   WBC 7.8 4.0 - 10.5 K/uL   RBC 4.98 3.87 - 5.11 MIL/uL   Hemoglobin 12.6 12.0 - 15.0 g/dL   HCT 95.2 84.1 - 32.4 %   MCV 78.9 (L) 80.0 - 100.0 fL   MCH 25.3 (L) 26.0 - 34.0 pg   MCHC 32.1 30.0 - 36.0 g/dL   RDW 40.1 02.7 - 25.3 %   Platelets 232 150 - 400 K/uL   nRBC 0.0 0.0 - 0.2 %  Urinalysis, Complete w Microscopic Urine, Clean Catch  Result Value Ref Range    Color, Urine YELLOW (A) YELLOW   APPearance HAZY (A) CLEAR   Specific Gravity, Urine 1.020 1.005 - 1.030   pH 5.0 5.0 - 8.0   Glucose, UA NEGATIVE NEGATIVE mg/dL   Hgb urine dipstick SMALL (A) NEGATIVE   Bilirubin Urine NEGATIVE NEGATIVE   Ketones, ur 20 (A) NEGATIVE mg/dL   Protein, ur 30 (A) NEGATIVE mg/dL   Nitrite NEGATIVE NEGATIVE   Leukocytes,Ua NEGATIVE NEGATIVE   RBC / HPF 0-5 0 - 5 RBC/hpf   WBC, UA 6-10 0 - 5 WBC/hpf   Bacteria, UA RARE (A) NONE SEEN   Squamous Epithelial / LPF 6-10 0 - 5   Mucus PRESENT    Non Squamous Epithelial PRESENT (A) NONE SEEN  Lactic acid, plasma  Result Value Ref Range   Lactic Acid, Venous 1.3 0.5 - 1.9 mmol/L  CBC  Result Value Ref Range   WBC 12.2 (H) 4.0 - 10.5 K/uL   RBC 4.59 3.87 - 5.11 MIL/uL   Hemoglobin 11.7 (L) 12.0 - 15.0 g/dL   HCT 66.4 40.3 - 47.4 %   MCV 79.5 (L) 80.0 - 100.0 fL   MCH 25.5 (L) 26.0 - 34.0 pg   MCHC 32.1 30.0 - 36.0 g/dL   RDW 25.9 56.3 - 87.5 %   Platelets 198 150 - 400 K/uL   nRBC 0.0 0.0 - 0.2 %  Basic metabolic panel  Result Value Ref Range   Sodium 144 135 - 145 mmol/L   Potassium 4.1 3.5 - 5.1 mmol/L   Chloride 105 98 - 111 mmol/L   CO2 23 22 - 32 mmol/L   Glucose, Bld 118 (H) 70 - 99 mg/dL   BUN 18 8 - 23 mg/dL   Creatinine, Ser 6.43 0.44 - 1.00 mg/dL   Calcium 9.0 8.9 - 32.9 mg/dL   GFR, Estimated >51 >88 mL/min   Anion gap 16 (H) 5 - 15  Basic metabolic panel  Result Value Ref Range   Sodium 147 (H) 135 - 145 mmol/L   Potassium 3.9 3.5 - 5.1 mmol/L   Chloride 107 98 - 111 mmol/L   CO2 27 22 - 32 mmol/L   Glucose, Bld 197 (H) 70 - 99 mg/dL   BUN 24 (H) 8 - 23 mg/dL   Creatinine, Ser 4.16 0.44 - 1.00 mg/dL   Calcium 9.5 8.9 - 60.6 mg/dL   GFR, Estimated >30 >16 mL/min   Anion gap 13 5 - 15  CBC  Result Value Ref Range   WBC 9.7  4.0 - 10.5 K/uL   RBC 4.22 3.87 - 5.11 MIL/uL   Hemoglobin 10.7 (L) 12.0 - 15.0 g/dL   HCT 44.3 (L) 15.4 - 00.8 %   MCV 80.1 80.0 - 100.0 fL   MCH 25.4 (L)  26.0 - 34.0 pg   MCHC 31.7 30.0 - 36.0 g/dL   RDW 67.6 (H) 19.5 - 09.3 %   Platelets 210 150 - 400 K/uL   nRBC 0.0 0.0 - 0.2 %  Magnesium  Result Value Ref Range   Magnesium 1.9 1.7 - 2.4 mg/dL  Phosphorus  Result Value Ref Range   Phosphorus 2.4 (L) 2.5 - 4.6 mg/dL  Triglycerides  Result Value Ref Range   Triglycerides 88 <150 mg/dL  Basic metabolic panel  Result Value Ref Range   Sodium 142 135 - 145 mmol/L   Potassium 3.3 (L) 3.5 - 5.1 mmol/L   Chloride 106 98 - 111 mmol/L   CO2 27 22 - 32 mmol/L   Glucose, Bld 141 (H) 70 - 99 mg/dL   BUN 14 8 - 23 mg/dL   Creatinine, Ser 2.67 0.44 - 1.00 mg/dL   Calcium 8.7 (L) 8.9 - 10.3 mg/dL   GFR, Estimated >12 >45 mL/min   Anion gap 9 5 - 15  CBC  Result Value Ref Range   WBC 8.2 4.0 - 10.5 K/uL   RBC 4.23 3.87 - 5.11 MIL/uL   Hemoglobin 11.0 (L) 12.0 - 15.0 g/dL   HCT 80.9 (L) 98.3 - 38.2 %   MCV 78.5 (L) 80.0 - 100.0 fL   MCH 26.0 26.0 - 34.0 pg   MCHC 33.1 30.0 - 36.0 g/dL   RDW 50.5 39.7 - 67.3 %   Platelets 169 150 - 400 K/uL   nRBC 0.0 0.0 - 0.2 %  Magnesium  Result Value Ref Range   Magnesium 1.7 1.7 - 2.4 mg/dL  Phosphorus  Result Value Ref Range   Phosphorus 2.3 (L) 2.5 - 4.6 mg/dL  Basic metabolic panel  Result Value Ref Range   Sodium 142 135 - 145 mmol/L   Potassium 3.3 (L) 3.5 - 5.1 mmol/L   Chloride 106 98 - 111 mmol/L   CO2 27 22 - 32 mmol/L   Glucose, Bld 138 (H) 70 - 99 mg/dL   BUN 9 8 - 23 mg/dL   Creatinine, Ser 4.19 0.44 - 1.00 mg/dL   Calcium 8.4 (L) 8.9 - 10.3 mg/dL   GFR, Estimated >37 >90 mL/min   Anion gap 9 5 - 15  CBC  Result Value Ref Range   WBC 5.9 4.0 - 10.5 K/uL   RBC 4.77 3.87 - 5.11 MIL/uL   Hemoglobin 12.2 12.0 - 15.0 g/dL   HCT 24.0 97.3 - 53.2 %   MCV 76.5 (L) 80.0 - 100.0 fL   MCH 25.6 (L) 26.0 - 34.0 pg   MCHC 33.4 30.0 - 36.0 g/dL   RDW 99.2 42.6 - 83.4 %   Platelets 182 150 - 400 K/uL   nRBC 0.0 0.0 - 0.2 %  Albumin  Result Value Ref Range   Albumin 2.6 (L) 3.5 -  5.0 g/dL  Magnesium  Result Value Ref Range   Magnesium 1.6 (L) 1.7 - 2.4 mg/dL  Phosphorus  Result Value Ref Range   Phosphorus 3.2 2.5 - 4.6 mg/dL  Basic metabolic panel  Result Value Ref Range   Sodium 141 135 - 145 mmol/L   Potassium 3.5 3.5 - 5.1 mmol/L   Chloride 107 98 -  111 mmol/L   CO2 25 22 - 32 mmol/L   Glucose, Bld 113 (H) 70 - 99 mg/dL   BUN 8 8 - 23 mg/dL   Creatinine, Ser 9.51 0.44 - 1.00 mg/dL   Calcium 8.6 (L) 8.9 - 10.3 mg/dL   GFR, Estimated >88 >41 mL/min   Anion gap 9 5 - 15  CBC  Result Value Ref Range   WBC 6.4 4.0 - 10.5 K/uL   RBC 5.19 (H) 3.87 - 5.11 MIL/uL   Hemoglobin 13.1 12.0 - 15.0 g/dL   HCT 66.0 63.0 - 16.0 %   MCV 77.5 (L) 80.0 - 100.0 fL   MCH 25.2 (L) 26.0 - 34.0 pg   MCHC 32.6 30.0 - 36.0 g/dL   RDW 10.9 32.3 - 55.7 %   Platelets 206 150 - 400 K/uL   nRBC 0.0 0.0 - 0.2 %  Magnesium  Result Value Ref Range   Magnesium 2.3 1.7 - 2.4 mg/dL  Phosphorus  Result Value Ref Range   Phosphorus 2.7 2.5 - 4.6 mg/dL  Basic metabolic panel  Result Value Ref Range   Sodium 140 135 - 145 mmol/L   Potassium 3.3 (L) 3.5 - 5.1 mmol/L   Chloride 104 98 - 111 mmol/L   CO2 25 22 - 32 mmol/L   Glucose, Bld 91 70 - 99 mg/dL   BUN 15 8 - 23 mg/dL   Creatinine, Ser 3.22 0.44 - 1.00 mg/dL   Calcium 8.9 8.9 - 02.5 mg/dL   GFR, Estimated >42 >70 mL/min   Anion gap 11 5 - 15  CBC  Result Value Ref Range   WBC 8.1 4.0 - 10.5 K/uL   RBC 4.88 3.87 - 5.11 MIL/uL   Hemoglobin 12.5 12.0 - 15.0 g/dL   HCT 62.3 76.2 - 83.1 %   MCV 78.1 (L) 80.0 - 100.0 fL   MCH 25.6 (L) 26.0 - 34.0 pg   MCHC 32.8 30.0 - 36.0 g/dL   RDW 51.7 61.6 - 07.3 %   Platelets 215 150 - 400 K/uL   nRBC 0.0 0.0 - 0.2 %  CBC  Result Value Ref Range   WBC 9.6 4.0 - 10.5 K/uL   RBC 4.74 3.87 - 5.11 MIL/uL   Hemoglobin 12.0 12.0 - 15.0 g/dL   HCT 71.0 62.6 - 94.8 %   MCV 77.8 (L) 80.0 - 100.0 fL   MCH 25.3 (L) 26.0 - 34.0 pg   MCHC 32.5 30.0 - 36.0 g/dL   RDW 54.6 27.0 - 35.0 %    Platelets 223 150 - 400 K/uL   nRBC 0.0 0.0 - 0.2 %  Hepatic function panel  Result Value Ref Range   Total Protein 5.7 (L) 6.5 - 8.1 g/dL   Albumin 2.8 (L) 3.5 - 5.0 g/dL   AST 20 15 - 41 U/L   ALT 19 0 - 44 U/L   Alkaline Phosphatase 68 38 - 126 U/L   Total Bilirubin 0.5 0.3 - 1.2 mg/dL   Bilirubin, Direct 0.1 0.0 - 0.2 mg/dL   Indirect Bilirubin 0.4 0.3 - 0.9 mg/dL  Albumin  Result Value Ref Range   Albumin 2.7 (L) 3.5 - 5.0 g/dL  Basic metabolic panel  Result Value Ref Range   Sodium 138 135 - 145 mmol/L   Potassium 3.7 3.5 - 5.1 mmol/L   Chloride 104 98 - 111 mmol/L   CO2 27 22 - 32 mmol/L   Glucose, Bld 115 (H) 70 - 99 mg/dL  BUN 21 8 - 23 mg/dL   Creatinine, Ser 1.61 0.44 - 1.00 mg/dL   Calcium 8.7 (L) 8.9 - 10.3 mg/dL   GFR, Estimated 58 (L) >60 mL/min   Anion gap 7 5 - 15     Assessment and Plan:     ICD-10-CM   1. SBO (small bowel obstruction) (HCC)  K56.609 Basic metabolic panel    Brain natriuretic peptide    CBC with Differential/Platelet    Hepatic function panel    CANCELED: Basic metabolic panel    CANCELED: CBC with Differential/Platelet  2. Protein-calorie malnutrition, moderate (HCC)  E44.0 Basic metabolic panel    Brain natriuretic peptide    CBC with Differential/Platelet    Hepatic function panel    CANCELED: Hepatic function panel  3. Sundowning  F05 Basic metabolic panel    Brain natriuretic peptide    CBC with Differential/Platelet    Hepatic function panel  4. Bilateral lower extremity edema  R60.0 Basic metabolic panel    Brain natriuretic peptide    CBC with Differential/Platelet    Hepatic function panel    CANCELED: Basic metabolic panel    CANCELED: CBC with Differential/Platelet    CANCELED: Brain natriuretic peptide  5. Shortness of breath  R06.02 Basic metabolic panel    Brain natriuretic peptide    CBC with Differential/Platelet    Hepatic function panel    CANCELED: Basic metabolic panel    CANCELED: CBC with  Differential/Platelet    CANCELED: Brain natriuretic peptide  6. Skin tag  L91.8 Basic metabolic panel    Brain natriuretic peptide    CBC with Differential/Platelet    Hepatic function panel   Resolved small bowel obstruction, I think that she will do fine here. Protein calorie malnutrition, increase p.o. intake as much as possible. Skin tag, this is right in front of the internal carotid artery, declined with movement, and she understands.  Bilateral lower extremity edema.  I did check a cardiac BNP and it was elevated at 300.  She has not had an echocardiogram since 2018.  She is almost due for her 81-month cardiology follow-up.  I will contact the patient's family and alert them to this and I think that it is a appropriate for her to follow-up with her cardiologist nonurgently.  Orders Placed This Encounter  Procedures  . Basic metabolic panel  . Brain natriuretic peptide  . CBC with Differential/Platelet  . Hepatic function panel    Follow-up: No follow-ups on file.  Signed,  Elpidio Galea. Eila Runyan, MD   Outpatient Encounter Medications as of 11/04/2020  Medication Sig  . amLODipine (NORVASC) 5 MG tablet Take 1 tablet by mouth once daily  . Cholecalciferol (VITAMIN D) 50 MCG (2000 UT) CAPS Take 1 capsule by mouth daily.  Marland Kitchen docusate sodium (COLACE) 100 MG capsule Take 200 mg by mouth daily.  . fluticasone (FLONASE) 50 MCG/ACT nasal spray Use 2 spray(s) in each nostril once daily  . lisinopril (ZESTRIL) 40 MG tablet Take 1 tablet by mouth once daily  . metoprolol succinate (TOPROL-XL) 25 MG 24 hr tablet Take 25 mg by mouth daily.  . Multiple Vitamins-Minerals (ICAPS) CAPS Take 1 capsule by mouth 2 (two) times daily.   . polyethylene glycol (MIRALAX / GLYCOLAX) 17 g packet Take 17 g by mouth daily as needed.  . Rivaroxaban (XARELTO) 15 MG TABS tablet TAKE 1 TABLET BY MOUTH ONCE DAILY WITH SUPPER  . senna-docusate (SENOKOT-S) 8.6-50 MG tablet Take 1 tablet by mouth 2 (  two) times  daily.  . traMADol (ULTRAM) 50 MG tablet Take 1 tablet (50 mg total) by mouth every 8 (eight) hours as needed for moderate pain.  . [DISCONTINUED] metoprolol succinate (TOPROL-XL) 25 MG 24 hr tablet Take 1 tablet by mouth once daily (Patient taking differently: Take by mouth daily.)  . [DISCONTINUED] NON FORMULARY Vitamin D 50 mcg, take one daily by mouth (Patient not taking: No sig reported)   No facility-administered encounter medications on file as of 11/04/2020.

## 2020-11-05 ENCOUNTER — Telehealth: Payer: Self-pay

## 2020-11-05 ENCOUNTER — Other Ambulatory Visit (INDEPENDENT_AMBULATORY_CARE_PROVIDER_SITE_OTHER): Payer: PPO

## 2020-11-05 DIAGNOSIS — S82001D Unspecified fracture of right patella, subsequent encounter for closed fracture with routine healing: Secondary | ICD-10-CM | POA: Diagnosis not present

## 2020-11-05 DIAGNOSIS — F05 Delirium due to known physiological condition: Secondary | ICD-10-CM | POA: Diagnosis not present

## 2020-11-05 DIAGNOSIS — R0602 Shortness of breath: Secondary | ICD-10-CM

## 2020-11-05 DIAGNOSIS — K56609 Unspecified intestinal obstruction, unspecified as to partial versus complete obstruction: Secondary | ICD-10-CM | POA: Diagnosis not present

## 2020-11-05 DIAGNOSIS — L8941 Pressure ulcer of contiguous site of back, buttock and hip, stage 1: Secondary | ICD-10-CM | POA: Diagnosis not present

## 2020-11-05 DIAGNOSIS — E440 Moderate protein-calorie malnutrition: Secondary | ICD-10-CM | POA: Diagnosis not present

## 2020-11-05 DIAGNOSIS — Z7901 Long term (current) use of anticoagulants: Secondary | ICD-10-CM | POA: Diagnosis not present

## 2020-11-05 DIAGNOSIS — Z7951 Long term (current) use of inhaled steroids: Secondary | ICD-10-CM | POA: Diagnosis not present

## 2020-11-05 DIAGNOSIS — L918 Other hypertrophic disorders of the skin: Secondary | ICD-10-CM | POA: Diagnosis not present

## 2020-11-05 DIAGNOSIS — M810 Age-related osteoporosis without current pathological fracture: Secondary | ICD-10-CM | POA: Diagnosis not present

## 2020-11-05 DIAGNOSIS — I4821 Permanent atrial fibrillation: Secondary | ICD-10-CM | POA: Diagnosis not present

## 2020-11-05 DIAGNOSIS — Z9181 History of falling: Secondary | ICD-10-CM | POA: Diagnosis not present

## 2020-11-05 DIAGNOSIS — R600 Localized edema: Secondary | ICD-10-CM | POA: Diagnosis not present

## 2020-11-05 DIAGNOSIS — H3530 Unspecified macular degeneration: Secondary | ICD-10-CM | POA: Diagnosis not present

## 2020-11-05 DIAGNOSIS — Z87891 Personal history of nicotine dependence: Secondary | ICD-10-CM | POA: Diagnosis not present

## 2020-11-05 DIAGNOSIS — I10 Essential (primary) hypertension: Secondary | ICD-10-CM | POA: Diagnosis not present

## 2020-11-05 LAB — CBC WITH DIFFERENTIAL/PLATELET
Basophils Absolute: 0 10*3/uL (ref 0.0–0.1)
Basophils Relative: 0.3 % (ref 0.0–3.0)
Eosinophils Absolute: 0.1 10*3/uL (ref 0.0–0.7)
Eosinophils Relative: 2.2 % (ref 0.0–5.0)
HCT: 35.7 % — ABNORMAL LOW (ref 36.0–46.0)
Hemoglobin: 11.7 g/dL — ABNORMAL LOW (ref 12.0–15.0)
Lymphocytes Relative: 30.2 % (ref 12.0–46.0)
Lymphs Abs: 1.9 10*3/uL (ref 0.7–4.0)
MCHC: 32.7 g/dL (ref 30.0–36.0)
MCV: 78.6 fl (ref 78.0–100.0)
Monocytes Absolute: 0.6 10*3/uL (ref 0.1–1.0)
Monocytes Relative: 9.2 % (ref 3.0–12.0)
Neutro Abs: 3.6 10*3/uL (ref 1.4–7.7)
Neutrophils Relative %: 58.1 % (ref 43.0–77.0)
Platelets: 179 10*3/uL (ref 150.0–400.0)
RBC: 4.54 Mil/uL (ref 3.87–5.11)
RDW: 17.8 % — ABNORMAL HIGH (ref 11.5–15.5)
WBC: 6.3 10*3/uL (ref 4.0–10.5)

## 2020-11-05 LAB — HEPATIC FUNCTION PANEL
ALT: 15 U/L (ref 0–35)
AST: 17 U/L (ref 0–37)
Albumin: 3.6 g/dL (ref 3.5–5.2)
Alkaline Phosphatase: 83 U/L (ref 39–117)
Bilirubin, Direct: 0.1 mg/dL (ref 0.0–0.3)
Total Bilirubin: 0.4 mg/dL (ref 0.2–1.2)
Total Protein: 6.4 g/dL (ref 6.0–8.3)

## 2020-11-05 LAB — BASIC METABOLIC PANEL
BUN: 16 mg/dL (ref 6–23)
CO2: 29 mEq/L (ref 19–32)
Calcium: 9.5 mg/dL (ref 8.4–10.5)
Chloride: 105 mEq/L (ref 96–112)
Creatinine, Ser: 0.72 mg/dL (ref 0.40–1.20)
GFR: 71.74 mL/min (ref >60.00)
Glucose, Bld: 103 mg/dL — ABNORMAL HIGH (ref 70–99)
Potassium: 4 mEq/L (ref 3.5–5.1)
Sodium: 141 mEq/L (ref 135–145)

## 2020-11-05 LAB — BRAIN NATRIURETIC PEPTIDE: Pro B Natriuretic peptide (BNP): 333 pg/mL — ABNORMAL HIGH (ref 0.0–100.0)

## 2020-11-05 NOTE — Telephone Encounter (Signed)
Patients daughter ask that nurse give her a call. Patient is needing a order from provider for 20 hours of custodial benefits through [employer] to [facility], before anything can happen.    Please call and advise

## 2020-11-05 NOTE — Telephone Encounter (Signed)
Sent message to [facility] with [facility] about this before calling daughter to see if he has more information on this for me. Patient is enrolled with [facility] for services. Waiting reply before calling daughter.

## 2020-11-05 NOTE — Telephone Encounter (Signed)
I am not sure what this means exactly.  Can you see if [name] has anything specific that we need to do?

## 2020-11-05 NOTE — Telephone Encounter (Signed)
Anastasiya can you help with this?

## 2020-11-06 ENCOUNTER — Telehealth: Payer: Self-pay

## 2020-11-06 NOTE — Telephone Encounter (Signed)
Paula Luna returned call wanted to let you know that no order is needed in epic. He has reached out to [place] and she will be contacting you. If you would like you can reach out to her but no order needed.

## 2020-11-06 NOTE — Telephone Encounter (Signed)
Spoke with Research scientist (physical sciences) at [facility]. She will see if they have the staff for patient's address area to offer this service, if they do then they have to submit PA to her insurance for approval-can take 72 hours to 7 days. Patient does qualify because she was in rehab within past 30 days. I advised [name] that Herbert Seta would call her either way and let her know how to proceed. If they do not have the staff we can try a different HH service. Herbert Seta will call me back also with update.

## 2020-11-06 NOTE — Telephone Encounter (Signed)
Noted. I sent message through Epic to [facility] to let him know I received this information. I called Heather at the number he provided [phone], left message for her to call me back to discuss in details so I know what to tell the daughter.

## 2020-11-06 NOTE — Telephone Encounter (Signed)
Spoke with [name], patient's daughter, she is having issues with her computer and asked me to let you know so when results are reviewed by the provider to call her about it instead of sending mychart message.

## 2020-11-06 NOTE — Telephone Encounter (Signed)
Noted  

## 2020-11-09 ENCOUNTER — Telehealth: Payer: Self-pay | Admitting: Family Medicine

## 2020-11-09 DIAGNOSIS — Z7901 Long term (current) use of anticoagulants: Secondary | ICD-10-CM | POA: Diagnosis not present

## 2020-11-09 DIAGNOSIS — S82031A Displaced transverse fracture of right patella, initial encounter for closed fracture: Secondary | ICD-10-CM | POA: Diagnosis not present

## 2020-11-09 DIAGNOSIS — Z87891 Personal history of nicotine dependence: Secondary | ICD-10-CM | POA: Diagnosis not present

## 2020-11-09 DIAGNOSIS — Z9181 History of falling: Secondary | ICD-10-CM | POA: Diagnosis not present

## 2020-11-09 DIAGNOSIS — M80061D Age-related osteoporosis with current pathological fracture, right lower leg, subsequent encounter for fracture with routine healing: Secondary | ICD-10-CM | POA: Diagnosis not present

## 2020-11-09 DIAGNOSIS — I10 Essential (primary) hypertension: Secondary | ICD-10-CM | POA: Diagnosis not present

## 2020-11-09 DIAGNOSIS — H3530 Unspecified macular degeneration: Secondary | ICD-10-CM | POA: Diagnosis not present

## 2020-11-09 DIAGNOSIS — I4821 Permanent atrial fibrillation: Secondary | ICD-10-CM | POA: Diagnosis not present

## 2020-11-09 DIAGNOSIS — K56609 Unspecified intestinal obstruction, unspecified as to partial versus complete obstruction: Secondary | ICD-10-CM | POA: Diagnosis not present

## 2020-11-09 MED ORDER — FUROSEMIDE 20 MG PO TABS: 20.0000 mg | ORAL_TABLET | Freq: Every day | ORAL | 3 refills | Status: DC

## 2020-11-09 NOTE — Telephone Encounter (Signed)
Paula Luna,   I saw Paula Luna - common patient - last week, and I checked a cardiac BNP, since she had some LE edema.  She is supposed to see you in a month.  I went ahead and started her on Lasix 20 mg.  She is already on Metoprolol, lisinopril, and Xarelto with A. Fib.  ProBNP (last 3 results) Recent Labs    11/05/20 1422  PROBNP 333.0*   I told her daughter that I would speak to you, so your office could schedule an appointment for her.  Thanks, [employer]

## 2020-11-09 NOTE — Telephone Encounter (Signed)
Heather from [facility] called back and said they do not have staffing for patient's area. She called daughter and left her a message letting her know this and that she could check with [employer] insurance to see what other HH could do this and would be in network with [employer], but still an issue with staffing. Will wait to hear back from daughter if she needs our help in this still.

## 2020-11-09 NOTE — Telephone Encounter (Signed)
This is understood.  I think I have to call them myself.

## 2020-11-09 NOTE — Telephone Encounter (Signed)
See all of my other notes.

## 2020-11-09 NOTE — Telephone Encounter (Signed)
Patient's daughter [name] left a voicemail wanting her mom's lab results from last week.

## 2020-11-10 ENCOUNTER — Telehealth: Payer: Self-pay | Admitting: Family Medicine

## 2020-11-10 NOTE — Telephone Encounter (Signed)
Is there any way that you can call the company  Can you tell [name] that I have to my knowledge completed the forms.  It is difficult to receive any home health now due to massive workforce shortage.  Our office has done everything that I know to do.  We can ask again, but this is a Investment banker, operational issue.

## 2020-11-10 NOTE — Telephone Encounter (Signed)
Paula Luna.  Can you look into this for [place].  I know we faxed from to [facility] last week.

## 2020-11-10 NOTE — Telephone Encounter (Signed)
[facility] is not in network with Medicare. [facility] is able to help. I provided patient's and daughter's information and they will call daughter and speak with her about this and what the needs are. At this time nothing else is needed from [place] but they will call me back if that changes.

## 2020-11-10 NOTE — Telephone Encounter (Signed)
Patients daughter called in about her having 20 extra Custodial benefit  hours in home care. Patients daughter states that the last referral that was given they have yet to come out to help her. She is requesting a referral be sent to either one of the following: 1. [facility] [phone] phone #  2. [facility] [phone] Phone #

## 2020-11-11 DIAGNOSIS — H3530 Unspecified macular degeneration: Secondary | ICD-10-CM | POA: Diagnosis not present

## 2020-11-11 DIAGNOSIS — Z9181 History of falling: Secondary | ICD-10-CM | POA: Diagnosis not present

## 2020-11-11 DIAGNOSIS — S82031A Displaced transverse fracture of right patella, initial encounter for closed fracture: Secondary | ICD-10-CM | POA: Diagnosis not present

## 2020-11-11 DIAGNOSIS — K56609 Unspecified intestinal obstruction, unspecified as to partial versus complete obstruction: Secondary | ICD-10-CM | POA: Diagnosis not present

## 2020-11-11 DIAGNOSIS — Z87891 Personal history of nicotine dependence: Secondary | ICD-10-CM | POA: Diagnosis not present

## 2020-11-11 DIAGNOSIS — M80061D Age-related osteoporosis with current pathological fracture, right lower leg, subsequent encounter for fracture with routine healing: Secondary | ICD-10-CM | POA: Diagnosis not present

## 2020-11-11 DIAGNOSIS — I10 Essential (primary) hypertension: Secondary | ICD-10-CM | POA: Diagnosis not present

## 2020-11-11 DIAGNOSIS — Z7901 Long term (current) use of anticoagulants: Secondary | ICD-10-CM | POA: Diagnosis not present

## 2020-11-11 DIAGNOSIS — I4821 Permanent atrial fibrillation: Secondary | ICD-10-CM | POA: Diagnosis not present

## 2020-11-12 ENCOUNTER — Encounter: Payer: Self-pay | Admitting: Family Medicine

## 2020-11-12 ENCOUNTER — Telehealth: Payer: Self-pay | Admitting: Family Medicine

## 2020-11-12 DIAGNOSIS — S82031A Displaced transverse fracture of right patella, initial encounter for closed fracture: Secondary | ICD-10-CM | POA: Diagnosis not present

## 2020-11-12 DIAGNOSIS — I4821 Permanent atrial fibrillation: Secondary | ICD-10-CM | POA: Diagnosis not present

## 2020-11-12 DIAGNOSIS — Z87891 Personal history of nicotine dependence: Secondary | ICD-10-CM | POA: Diagnosis not present

## 2020-11-12 DIAGNOSIS — Z7901 Long term (current) use of anticoagulants: Secondary | ICD-10-CM | POA: Diagnosis not present

## 2020-11-12 DIAGNOSIS — H3530 Unspecified macular degeneration: Secondary | ICD-10-CM | POA: Diagnosis not present

## 2020-11-12 DIAGNOSIS — Z9181 History of falling: Secondary | ICD-10-CM | POA: Diagnosis not present

## 2020-11-12 DIAGNOSIS — K56609 Unspecified intestinal obstruction, unspecified as to partial versus complete obstruction: Secondary | ICD-10-CM | POA: Diagnosis not present

## 2020-11-12 DIAGNOSIS — I10 Essential (primary) hypertension: Secondary | ICD-10-CM | POA: Diagnosis not present

## 2020-11-12 DIAGNOSIS — M80061D Age-related osteoporosis with current pathological fracture, right lower leg, subsequent encounter for fracture with routine healing: Secondary | ICD-10-CM | POA: Diagnosis not present

## 2020-11-12 NOTE — Telephone Encounter (Signed)
[facility] called in wanting a order for nursing evaluation for skin concerns. EM

## 2020-11-12 NOTE — Telephone Encounter (Signed)
Verbal orders given to [facility] via telephone for nursing evaluation for skin concerns.

## 2020-11-12 NOTE — Telephone Encounter (Signed)
agree

## 2020-11-17 ENCOUNTER — Telehealth: Payer: Self-pay | Admitting: Family Medicine

## 2020-11-17 NOTE — Telephone Encounter (Signed)
[name] called in wanted to know about getting a referral for custodial care , mrs allaire is trying to access her 20hrs of custodial care through medicare advantage/ health team advantage , and the CPT code is  [phone].. fax number is [phone] attn: bruce mcreynolds

## 2020-11-18 DIAGNOSIS — M25561 Pain in right knee: Secondary | ICD-10-CM | POA: Diagnosis not present

## 2020-11-18 DIAGNOSIS — L989 Disorder of the skin and subcutaneous tissue, unspecified: Secondary | ICD-10-CM | POA: Diagnosis not present

## 2020-11-18 DIAGNOSIS — S82091A Other fracture of right patella, initial encounter for closed fracture: Secondary | ICD-10-CM | POA: Diagnosis not present

## 2020-11-18 DIAGNOSIS — W19XXXA Unspecified fall, initial encounter: Secondary | ICD-10-CM | POA: Diagnosis not present

## 2020-11-18 NOTE — Telephone Encounter (Signed)
Order for custodial care written on prescription pad and faxed to Rod Holler at [phone].

## 2020-11-18 NOTE — Telephone Encounter (Signed)
I am at a loss having tried multiple times to help and facilitate.  If you write out, I will sign and we can fax.

## 2020-11-20 DIAGNOSIS — W19XXXA Unspecified fall, initial encounter: Secondary | ICD-10-CM | POA: Diagnosis not present

## 2020-11-20 DIAGNOSIS — L989 Disorder of the skin and subcutaneous tissue, unspecified: Secondary | ICD-10-CM | POA: Diagnosis not present

## 2020-11-20 DIAGNOSIS — M25561 Pain in right knee: Secondary | ICD-10-CM | POA: Diagnosis not present

## 2020-11-20 DIAGNOSIS — S82091A Other fracture of right patella, initial encounter for closed fracture: Secondary | ICD-10-CM | POA: Diagnosis not present

## 2020-11-23 ENCOUNTER — Encounter: Payer: Self-pay | Admitting: Podiatry

## 2020-11-23 ENCOUNTER — Ambulatory Visit: Payer: PPO | Admitting: Podiatry

## 2020-11-23 ENCOUNTER — Other Ambulatory Visit: Payer: Self-pay

## 2020-11-23 DIAGNOSIS — B351 Tinea unguium: Secondary | ICD-10-CM

## 2020-11-23 DIAGNOSIS — M25561 Pain in right knee: Secondary | ICD-10-CM | POA: Diagnosis not present

## 2020-11-23 DIAGNOSIS — M79676 Pain in unspecified toe(s): Secondary | ICD-10-CM | POA: Diagnosis not present

## 2020-11-23 NOTE — Progress Notes (Signed)
Paula Luna presents today chief complaint of painfully elongated toenails 1 through 5 bilaterally.  Objective: Nails 1 through 5 bilaterally are thick yellow dystrophic-like mycotic painful palpation pulses are strongly palpable no open lesions or wounds are noted.  Assessment: Pain in limb secondary to onychomycosis.  Plan: Debridement of toenails 1 through 5 bilateral.

## 2020-11-25 DIAGNOSIS — W19XXXA Unspecified fall, initial encounter: Secondary | ICD-10-CM | POA: Diagnosis not present

## 2020-11-25 DIAGNOSIS — M25561 Pain in right knee: Secondary | ICD-10-CM | POA: Diagnosis not present

## 2020-11-25 DIAGNOSIS — L989 Disorder of the skin and subcutaneous tissue, unspecified: Secondary | ICD-10-CM | POA: Diagnosis not present

## 2020-11-25 DIAGNOSIS — S82091A Other fracture of right patella, initial encounter for closed fracture: Secondary | ICD-10-CM | POA: Diagnosis not present

## 2020-11-27 DIAGNOSIS — M25561 Pain in right knee: Secondary | ICD-10-CM | POA: Diagnosis not present

## 2020-11-27 DIAGNOSIS — W19XXXA Unspecified fall, initial encounter: Secondary | ICD-10-CM | POA: Diagnosis not present

## 2020-11-27 DIAGNOSIS — L989 Disorder of the skin and subcutaneous tissue, unspecified: Secondary | ICD-10-CM | POA: Diagnosis not present

## 2020-11-27 DIAGNOSIS — S82091A Other fracture of right patella, initial encounter for closed fracture: Secondary | ICD-10-CM | POA: Diagnosis not present

## 2020-12-01 ENCOUNTER — Telehealth: Payer: Self-pay

## 2020-12-01 NOTE — Telephone Encounter (Signed)
Verbal orders given to [facility] via telephone for mobile lumbar spine x-ray per Dr. Patsy Lager.

## 2020-12-01 NOTE — Telephone Encounter (Signed)
Received a call from [facility], PT stating that patient fell backwards onto her sacrum Sunday night. Since then, patient has had pain in her lower back (L5 region), which has affected the patients ability to bend or arise from a chair. Enrique Sack, PT is requesting verbal orders for a mobile X-ray of the patients back. Please advise.

## 2020-12-01 NOTE — Telephone Encounter (Signed)
Can  you help me order a mobile x-ray for a mobile lumbar spine x-ray series.   Let me know if anything is needed from me additionally.

## 2020-12-02 DIAGNOSIS — M25561 Pain in right knee: Secondary | ICD-10-CM | POA: Diagnosis not present

## 2020-12-02 DIAGNOSIS — L989 Disorder of the skin and subcutaneous tissue, unspecified: Secondary | ICD-10-CM | POA: Diagnosis not present

## 2020-12-02 DIAGNOSIS — W19XXXA Unspecified fall, initial encounter: Secondary | ICD-10-CM | POA: Diagnosis not present

## 2020-12-02 DIAGNOSIS — S82091A Other fracture of right patella, initial encounter for closed fracture: Secondary | ICD-10-CM | POA: Diagnosis not present

## 2020-12-04 DIAGNOSIS — M545 Low back pain, unspecified: Secondary | ICD-10-CM | POA: Diagnosis not present

## 2020-12-04 DIAGNOSIS — Z87891 Personal history of nicotine dependence: Secondary | ICD-10-CM | POA: Diagnosis not present

## 2020-12-04 DIAGNOSIS — Z9181 History of falling: Secondary | ICD-10-CM | POA: Diagnosis not present

## 2020-12-04 DIAGNOSIS — M5136 Other intervertebral disc degeneration, lumbar region: Secondary | ICD-10-CM | POA: Diagnosis not present

## 2020-12-04 DIAGNOSIS — S82091A Other fracture of right patella, initial encounter for closed fracture: Secondary | ICD-10-CM | POA: Diagnosis not present

## 2020-12-04 DIAGNOSIS — I10 Essential (primary) hypertension: Secondary | ICD-10-CM | POA: Diagnosis not present

## 2020-12-04 DIAGNOSIS — M25561 Pain in right knee: Secondary | ICD-10-CM | POA: Diagnosis not present

## 2020-12-04 DIAGNOSIS — Z7901 Long term (current) use of anticoagulants: Secondary | ICD-10-CM | POA: Diagnosis not present

## 2020-12-04 DIAGNOSIS — L989 Disorder of the skin and subcutaneous tissue, unspecified: Secondary | ICD-10-CM | POA: Diagnosis not present

## 2020-12-04 DIAGNOSIS — K56609 Unspecified intestinal obstruction, unspecified as to partial versus complete obstruction: Secondary | ICD-10-CM | POA: Diagnosis not present

## 2020-12-04 DIAGNOSIS — I4821 Permanent atrial fibrillation: Secondary | ICD-10-CM | POA: Diagnosis not present

## 2020-12-04 DIAGNOSIS — M80061D Age-related osteoporosis with current pathological fracture, right lower leg, subsequent encounter for fracture with routine healing: Secondary | ICD-10-CM | POA: Diagnosis not present

## 2020-12-04 DIAGNOSIS — H3530 Unspecified macular degeneration: Secondary | ICD-10-CM | POA: Diagnosis not present

## 2020-12-04 DIAGNOSIS — S82031A Displaced transverse fracture of right patella, initial encounter for closed fracture: Secondary | ICD-10-CM | POA: Diagnosis not present

## 2020-12-04 DIAGNOSIS — W19XXXA Unspecified fall, initial encounter: Secondary | ICD-10-CM | POA: Diagnosis not present

## 2020-12-05 ENCOUNTER — Other Ambulatory Visit: Payer: Self-pay | Admitting: Family Medicine

## 2020-12-07 NOTE — Telephone Encounter (Signed)
No fracture on x-ray.  Degenerative changes only.  [facility] notified by fax to have patient rest, can use ice on area and take tylenol as needed per Dr. Patsy Lager.

## 2020-12-08 NOTE — Telephone Encounter (Signed)
Left message for [name] that she can change her mom's furosemide 20 mg to as needed if she has swelling per Dr. Patsy Lager.

## 2020-12-08 NOTE — Telephone Encounter (Signed)
Paula Luna (DPR signed) said pt fell on 11/29/20 and pt continues the tylenol as needed for pain; the tylenol does help the back pain and pt only takes when needed for the pain. [name] has been giving pt the furosemide 20 mg daily and now the swelling in pts lower legs and ankles are gone and [name] wants to know if should continue giving furosemide 20 mg daily as scheduled or give the diuretic as prn when there is swelling. Paula Luna request cb after reviewed by DR Copland.

## 2020-12-08 NOTE — Telephone Encounter (Signed)
I would use now only prn if she has swelling

## 2020-12-09 ENCOUNTER — Telehealth: Payer: Self-pay

## 2020-12-09 DIAGNOSIS — L989 Disorder of the skin and subcutaneous tissue, unspecified: Secondary | ICD-10-CM | POA: Diagnosis not present

## 2020-12-09 DIAGNOSIS — M25561 Pain in right knee: Secondary | ICD-10-CM | POA: Diagnosis not present

## 2020-12-09 DIAGNOSIS — W19XXXA Unspecified fall, initial encounter: Secondary | ICD-10-CM | POA: Diagnosis not present

## 2020-12-09 DIAGNOSIS — S82091A Other fracture of right patella, initial encounter for closed fracture: Secondary | ICD-10-CM | POA: Diagnosis not present

## 2020-12-09 NOTE — Telephone Encounter (Signed)
[name] (DPR signed) left v/m requesting to speak with [name] CMA; [name] had been told that Dr Patsy Lager could write a letter that request that medicare supply assurance pads for pt; pt is using a lot of the pads and they are expensive. [name] request cb.

## 2020-12-09 NOTE — Telephone Encounter (Signed)
Can you help me here?

## 2020-12-09 NOTE — Telephone Encounter (Signed)
Spoke with [name].  She is asking for an order for Assurance or Depends pads size sm/med so Medicare will pay for them   I advised her to check with [facility] to see what supply company they would recommend to get these pads from.  Once she has a name of a company she would like to use, she will call me back and we can send in order for her.

## 2020-12-11 NOTE — Telephone Encounter (Signed)
[name] (DPR signed) left v/m for [name] CMA that insurance will not cover assurance pads so no need for rx.

## 2020-12-11 NOTE — Telephone Encounter (Signed)
Noted  

## 2020-12-14 ENCOUNTER — Telehealth: Payer: Self-pay | Admitting: Family Medicine

## 2020-12-14 NOTE — Telephone Encounter (Signed)
Home Health verbal orders-caller/Agency: [facility]  Callback number:  [phone]  Requesting OT/PT/Skilled nursing/Social Work/Speech:  Skilled Nursing  Reason:Evaluation for wound on her Buttocks  Frequency: Evaluation for now

## 2020-12-14 NOTE — Telephone Encounter (Signed)
Yes, I agree to all.

## 2020-12-14 NOTE — Telephone Encounter (Signed)
Left message giving verbal orders for Skilled Nursing for wound care on Paula Luna's buttocks per Dr. Patsy Lager.

## 2020-12-15 DIAGNOSIS — Z9181 History of falling: Secondary | ICD-10-CM | POA: Diagnosis not present

## 2020-12-15 DIAGNOSIS — H3530 Unspecified macular degeneration: Secondary | ICD-10-CM | POA: Diagnosis not present

## 2020-12-15 DIAGNOSIS — I10 Essential (primary) hypertension: Secondary | ICD-10-CM | POA: Diagnosis not present

## 2020-12-15 DIAGNOSIS — I4821 Permanent atrial fibrillation: Secondary | ICD-10-CM | POA: Diagnosis not present

## 2020-12-15 DIAGNOSIS — K56609 Unspecified intestinal obstruction, unspecified as to partial versus complete obstruction: Secondary | ICD-10-CM | POA: Diagnosis not present

## 2020-12-15 DIAGNOSIS — S82031A Displaced transverse fracture of right patella, initial encounter for closed fracture: Secondary | ICD-10-CM | POA: Diagnosis not present

## 2020-12-15 DIAGNOSIS — Z87891 Personal history of nicotine dependence: Secondary | ICD-10-CM | POA: Diagnosis not present

## 2020-12-15 DIAGNOSIS — M80061D Age-related osteoporosis with current pathological fracture, right lower leg, subsequent encounter for fracture with routine healing: Secondary | ICD-10-CM | POA: Diagnosis not present

## 2020-12-15 DIAGNOSIS — Z7901 Long term (current) use of anticoagulants: Secondary | ICD-10-CM | POA: Diagnosis not present

## 2020-12-16 ENCOUNTER — Telehealth: Payer: Self-pay | Admitting: Internal Medicine

## 2020-12-16 DIAGNOSIS — M80061D Age-related osteoporosis with current pathological fracture, right lower leg, subsequent encounter for fracture with routine healing: Secondary | ICD-10-CM | POA: Diagnosis not present

## 2020-12-16 DIAGNOSIS — H3530 Unspecified macular degeneration: Secondary | ICD-10-CM | POA: Diagnosis not present

## 2020-12-16 DIAGNOSIS — Z87891 Personal history of nicotine dependence: Secondary | ICD-10-CM | POA: Diagnosis not present

## 2020-12-16 DIAGNOSIS — S82031A Displaced transverse fracture of right patella, initial encounter for closed fracture: Secondary | ICD-10-CM | POA: Diagnosis not present

## 2020-12-16 DIAGNOSIS — Z7901 Long term (current) use of anticoagulants: Secondary | ICD-10-CM | POA: Diagnosis not present

## 2020-12-16 DIAGNOSIS — K56609 Unspecified intestinal obstruction, unspecified as to partial versus complete obstruction: Secondary | ICD-10-CM | POA: Diagnosis not present

## 2020-12-16 DIAGNOSIS — Z9181 History of falling: Secondary | ICD-10-CM | POA: Diagnosis not present

## 2020-12-16 DIAGNOSIS — I4821 Permanent atrial fibrillation: Secondary | ICD-10-CM | POA: Diagnosis not present

## 2020-12-16 DIAGNOSIS — I10 Essential (primary) hypertension: Secondary | ICD-10-CM | POA: Diagnosis not present

## 2020-12-16 NOTE — Telephone Encounter (Signed)
BP   118/51  HR 44-55  Patient not dizzy or lethargic per home health nurse.  This is not a one time episode of bradycardia . Bradycardia has been previously noted.    No call back needed this was just a notification call from [facility].

## 2020-12-16 NOTE — Telephone Encounter (Signed)
Thank you for the update.  F/u as scheduled later this month.  Yvonne Kendall, MD [facility]

## 2020-12-16 NOTE — Telephone Encounter (Signed)
Noted  

## 2020-12-23 DIAGNOSIS — H353124 Nonexudative age-related macular degeneration, left eye, advanced atrophic with subfoveal involvement: Secondary | ICD-10-CM | POA: Diagnosis not present

## 2020-12-23 DIAGNOSIS — H353112 Nonexudative age-related macular degeneration, right eye, intermediate dry stage: Secondary | ICD-10-CM | POA: Diagnosis not present

## 2020-12-30 ENCOUNTER — Other Ambulatory Visit: Payer: Self-pay

## 2020-12-30 ENCOUNTER — Ambulatory Visit: Payer: PPO | Admitting: Internal Medicine

## 2020-12-30 ENCOUNTER — Encounter: Payer: Self-pay | Admitting: Family

## 2020-12-30 ENCOUNTER — Ambulatory Visit: Payer: PPO | Admitting: Family

## 2020-12-30 VITALS — BP 128/64 | HR 61 | Ht 58.6 in | Wt 120.0 lb

## 2020-12-30 DIAGNOSIS — H3530 Unspecified macular degeneration: Secondary | ICD-10-CM | POA: Diagnosis not present

## 2020-12-30 DIAGNOSIS — I10 Essential (primary) hypertension: Secondary | ICD-10-CM

## 2020-12-30 DIAGNOSIS — Z87891 Personal history of nicotine dependence: Secondary | ICD-10-CM | POA: Diagnosis not present

## 2020-12-30 DIAGNOSIS — Z7901 Long term (current) use of anticoagulants: Secondary | ICD-10-CM | POA: Diagnosis not present

## 2020-12-30 DIAGNOSIS — I4821 Permanent atrial fibrillation: Secondary | ICD-10-CM | POA: Diagnosis not present

## 2020-12-30 DIAGNOSIS — R600 Localized edema: Secondary | ICD-10-CM

## 2020-12-30 DIAGNOSIS — I872 Venous insufficiency (chronic) (peripheral): Secondary | ICD-10-CM

## 2020-12-30 DIAGNOSIS — S82031A Displaced transverse fracture of right patella, initial encounter for closed fracture: Secondary | ICD-10-CM | POA: Diagnosis not present

## 2020-12-30 DIAGNOSIS — K56609 Unspecified intestinal obstruction, unspecified as to partial versus complete obstruction: Secondary | ICD-10-CM | POA: Diagnosis not present

## 2020-12-30 DIAGNOSIS — Z9181 History of falling: Secondary | ICD-10-CM | POA: Diagnosis not present

## 2020-12-30 DIAGNOSIS — M80061D Age-related osteoporosis with current pathological fracture, right lower leg, subsequent encounter for fracture with routine healing: Secondary | ICD-10-CM | POA: Diagnosis not present

## 2020-12-30 MED ORDER — METOPROLOL SUCCINATE ER 25 MG PO TB24: 12.5000 mg | ORAL_TABLET | Freq: Every day | ORAL | 1 refills | Status: DC

## 2020-12-30 MED ORDER — FUROSEMIDE 20 MG PO TABS: 20.0000 mg | ORAL_TABLET | ORAL | 2 refills | Status: DC

## 2020-12-30 NOTE — Patient Instructions (Addendum)
Medication Instructions:  Your physician has recommended you make the following change in your medication:   REDUCE Metoprolol Succinate to 12.5mg  (half tablet) daily  CHANGE Furosemide (Lasix) to 20mg  three times per week  *If you need a refill on your cardiac medications before your next appointment, please call your pharmacy*   Lab Work: Your physician recommends lab work today: BMP, BNP   If you have labs (blood work) drawn today and your tests are completely normal, you will receive your results only by: MyChart Message (if you have MyChart) OR . A paper copy in the mail If you have any lab test that is abnormal or we need to change your treatment, we will call you to review the results.   Testing/Procedures: Your EKG today showed rate controlled atrial fibrillation.   Follow-Up: At [facility], you and your health needs are our priority.  As part of our continuing mission to provide you with exceptional heart care, we have created designated Provider Care Teams.  These Care Teams include your primary Cardiologist (physician) and Advanced Practice Providers (APPs -  Physician Assistants and Nurse Practitioners) who all work together to provide you with the care you need, when you need it.  We recommend signing up for the patient portal called "MyChart".  Sign up information is provided on this After Visit Summary.  MyChart is used to connect with patients for Virtual Visits (Telemedicine).  Patients are able to view lab/test results, encounter notes, upcoming appointments, etc.  Non-urgent messages can be sent to your provider as well.   To learn more about what you can do with MyChart, go to [facility].    Your next appointment:   6 month(s)  The format for your next appointment:   In Person  Provider:   You may see [email or web address], MD or one of the following Advanced Practice Providers on your designated Care Team:    Yvonne Kendall, NP  Nicolasa Ducking,  PA-C  Eula Listen, PA-C  Cadence Marisue Ivan, Fransico Michael  [place], NP  Other Instructions   Heart Healthy Diet Recommendations: A low-salt diet is recommended. Meats should be grilled, baked, or boiled. Avoid fried foods. Focus on lean protein sources like fish or chicken with vegetables and fruits. The American Heart Association is a Gillian Shields!  American Heart Association Diet and Lifeystyle Recommendations   Exercise recommendations: The American Heart Association recommends 150 minutes of moderate intensity exercise weekly. Try 30 minutes of moderate intensity exercise 4-5 times per week. This could include walking, jogging, or swimming.  To prevent or reduce lower extremity swelling: . Eat a low salt diet. Salt makes the body hold onto extra fluid which causes swelling. . Sit with legs elevated. For example, in the recliner or on an ottoman.  . Wear knee-high compression stockings during the daytime. Ones labeled 15-20 mmHg provide good compression.   Chronic Venous Insufficiency Chronic venous insufficiency is a condition where the leg veins cannot effectively pump blood from the legs to the heart. This happens when the vein walls are either stretched, weakened, or damaged, or when the valves inside the vein are damaged. With the right treatment, you should be able to continue with an active life. This condition is also called venous stasis. What are the causes? Common causes of this condition include:  High blood pressure inside the veins (venous hypertension).  Sitting or standing too long, causing increased blood pressure in the leg veins.  A blood clot that blocks blood flow  in a vein (deep vein thrombosis, DVT).  Inflammation of a vein (phlebitis) that causes a blood clot to form.  Tumors in the pelvis that cause blood to back up. What increases the risk? The following factors may make you more likely to develop this condition:  Having a family history of this  condition.  Obesity.  Pregnancy.  Living without enough regular physical activity or exercise (sedentary lifestyle).  Smoking.  Having a job that requires long periods of standing or sitting in one place.  Being a certain age. Women in their 33s and 64s and men in their 30s are more likely to develop this condition. What are the signs or symptoms? Symptoms of this condition include:  Veins that are enlarged, bulging, or twisted (varicose veins).  Skin breakdown or ulcers.  Reddened skin or dark discoloration of skin on the leg between the knee and ankle.  Brown, smooth, tight, and painful skin just above the ankle, usually on the inside of the leg (lipodermatosclerosis).  Swelling of the legs. How is this diagnosed? This condition may be diagnosed based on:  Your medical history.  A physical exam.  Tests, such as: ? A procedure that creates an image of a blood vessel and nearby organs and provides information about blood flow through the blood vessel (duplex ultrasound). ? A procedure that tests blood flow (plethysmography). ? A procedure that looks at the veins using X-ray and dye (venogram). How is this treated? The goals of treatment are to help you return to an active life and to minimize pain or disability. Treatment depends on the severity of your condition, and it may include:  Wearing compression stockings. These can help relieve symptoms and help prevent your condition from getting worse. However, they do not cure the condition.   Follow these instructions at home:  Wear compression stockings as told by your health care provider. These stockings help to prevent blood clots and reduce swelling in your legs.  Take over-the-counter and prescription medicines only as told by your health care provider.  Stay active by exercising, walking, or doing different activities. Ask your health care provider what activities are safe for you and how much exercise you  need.  Drink enough fluid to keep your urine pale yellow.  Do not use any products that contain nicotine or tobacco, such as cigarettes, e-cigarettes, and chewing tobacco. If you need help quitting, ask your health care provider.  Keep all follow-up visits as told by your health care provider. This is important.      Contact a health care provider if you:  Have redness, swelling, or more pain in the affected area.  See a red streak or line that goes up or down from the affected area.  Have skin breakdown or skin loss in the affected area, even if the breakdown is small.  Get an injury in the affected area. Get help right away if:  You get an injury and an open wound in the affected area.  You have: ? Severe pain that does not get better with medicine. ? Sudden numbness or weakness in the foot or ankle below the affected area. ? Trouble moving your foot or ankle. ? A fever. ? Worse or persistent symptoms. ? Chest pain. ? Shortness of breath. Summary  Chronic venous insufficiency is a condition where the leg veins cannot effectively pump blood from the legs to the heart.  Chronic venous insufficiency occurs when the vein walls become stretched, weakened, or damaged, or  when valves within the vein are damaged.  Treatment depends on how severe your condition is. It often involves wearing compression stockings and may involve having a procedure.  Make sure you stay active by exercising, walking, or doing different activities. Ask your health care provider what activities are safe for you and how much exercise you need. This information is not intended to replace advice given to you by your health care provider. Make sure you discuss any questions you have with your health care provider. Document Revised: 05/22/2018 Document Reviewed: 05/22/2018 Elsevier Patient Education  2021 [employer].

## 2020-12-30 NOTE — Progress Notes (Signed)
Office Visit    Patient Name: Paula Luna Date of Encounter: 12/30/2020  PCP:  Hannah Beat, MD   [facility]  Cardiologist:  Yvonne Kendall, MD  Advanced Practice Provider:  No care team member to display Electrophysiologist:  None    Chief Complaint    Paula Luna is a 87 y.o. female with a hx of permanent atrial fibrillation, hypertension, ischemic bowel s/p small bowel resection, osteoporosis, anemia presents today for follow up of atrial fibrillation.   Past Medical History    Past Medical History:  Diagnosis Date  . Hypertension 12/25/2013  . Ischemic bowel disease (HCC)    a. 03/2017 abd pain-->internal herina and ischemic jejunum s/p ex lap and small bowel resection.  . Macular degeneration of left eye 01/06/2015  . Osteoporosis   . Permanent atrial fibrillation (HCC)    a. First noted 03/2017;  b. 07/2017 Echo: EF 60-65%, no rwma, mild MR, mildly to mod dil LA; c. 07/2017 48hr Holter: persistent Afib, avg HR of 66 (range 39-140 bpm);  d. CHA2DS2VASc = 4-->Xarelto initiated 07/2017.   Past Surgical History:  Procedure Laterality Date  . CLOSED REDUCTION PROXIMAL HUMERUS FRACTURE Left 2017  . LAPAROSCOPIC SMALL BOWEL RESECTION    . LAPAROTOMY N/A 03/18/2017   Procedure: EXPLORATORY LAPAROTOMY, SMALL BOWEL RESECTION;  Surgeon: Leafy Ro, MD;  Location: [facility] ORS;  Service: General;  Laterality: N/A;  . TOTAL ABDOMINAL HYSTERECTOMY      Allergies  Allergies  Allergen Reactions  . Latex Rash  . Tetanus Toxoid Anaphylaxis    REACTION: anaphylaxis  . Tramadol Other (See Comments)    Caused severe constipation---bowel obstruction  . Alendronate Sodium     REACTION: passed out    History of Present Illness    Paula Luna is a 87 y.o. female with a hx of permanent atrial fibrillation, hypertension, ischemic bowel s/p small bowel resection, osteoporosis, anemia last seen 06/15/20.  She has fallen December 2021 with  broken kneecap. She has seen Dr. Darrelyn Hillock with [facility]. It was broken only 3/4 of the way and did not require surgery per her daughter's report. She has been working with physical therapy. At follow up it had noted to be healing.   She was hospitalized January 2022 for abdominal pain and small bowel obstruction. No surgical intervention was required. She did have some hospital acquired delerium. She was discharged to SNF and spent 20 days at [facility] in [place]. She got home 10/29/20.   Lab work 11/05/20 creatinine 0.72, GFR 71.74, AST 17, ALT 15, Hb 11.7, Hct 35.7, BNP 333. She was seen by primary care with notation of lower extremity edema. She was started on Lasix 20mg  daily.   She presents today for follow up with her daughter. Reports she took Furosemide for 1 month then has taken intermittently since that time. Swelling was better on Furosemide but since discontinuing has increased. Endorses keeping feet up and eating low salt diet. Has not tried compression stockings. Reports no shortness of breath nor dyspnea on exertion. Reports no chest pain, pressure, or tightness. No orthopnea, PND. Reports no palpitations. Home health has been monitoring BP/HR at home with HR as low as 44-55 and BP as low as 110s/60s. Denies lightheadedness, dizziness, near syncope, syncope.   EKGs/Labs/Other Studies Reviewed:   The following studies were reviewed today:  Echo 07/2017 Left ventricle: The cavity size was normal. There was mild    concentric hypertrophy. Systolic function was  normal. The    estimated ejection fraction was in the range of 60% to 65%. Wall    motion was normal; there were no regional wall motion    abnormalities. The study was not technically sufficient to allow    evaluation of LV diastolic dysfunction due to atrial    fibrillation.  - Mitral valve: There was mild regurgitation.  - Left atrium: The atrium was mildly to moderately dilated.   Holter monitor 06/2017  The  patient was monitored for 48 hours.  The predominant rhythm was atrial fibrillation (100% burden) with an average rate of 66 bpm (range 39-140 bpm). The longest R-R interval wass 2.5 seconds.  There were occasional wide-complex beats that could represent PVCs and/or aberrancy.  No sustained ventricular arrhythmias were identified.   Atrial fibrillation with variable heart rate response. Occasional PVCs versus aberrancy.  EKG:  EKG is ordered today.  The ekg ordered today demonstrates rate controlled atrial fibrillation 61 bpm with no acute ST/T wave changes.   Recent Labs: 03/18/2020: TSH 2.12 10/06/2020: Magnesium 2.3 11/05/2020: ALT 15; BUN 16; Creatinine, Ser 0.72; Hemoglobin 11.7; Platelets 179.0; Potassium 4.0; Pro B Natriuretic peptide (BNP) 333.0; Sodium 141  Recent Lipid Panel    Component Value Date/Time   CHOL 166 03/18/2020 1007   TRIG 88 10/03/2020 0453   HDL 63.10 03/18/2020 1007   CHOLHDL 3 03/18/2020 1007   VLDL 16.8 03/18/2020 1007   LDLCALC 86 03/18/2020 1007   LDLDIRECT 101.2 05/20/2009 0000    Risk Assessment/Calculations:   CHA2DS2-VASc Score = 4  This indicates a 4.8% annual risk of stroke. The patient's score is based upon: CHF History: No HTN History: Yes Diabetes History: No Stroke History: No Vascular Disease History: No Age Score: 2 Gender Score: 1   Medications    Current Meds  Medication Sig  . amLODipine (NORVASC) 5 MG tablet Take 1 tablet by mouth once daily  . Cholecalciferol (VITAMIN D) 50 MCG (2000 UT) CAPS Take 1 capsule by mouth daily.  Marland Kitchen docusate sodium (COLACE) 100 MG capsule Take 200 mg by mouth daily.  . fluticasone (FLONASE) 50 MCG/ACT nasal spray Use 2 spray(s) in each nostril once daily  . lisinopril (ZESTRIL) 40 MG tablet Take 1 tablet by mouth once daily  . Multiple Vitamins-Minerals (ICAPS) CAPS Take 1 capsule by mouth 2 (two) times daily.   . Rivaroxaban (XARELTO) 15 MG TABS tablet TAKE 1 TABLET BY MOUTH ONCE DAILY WITH  SUPPER  . [DISCONTINUED] furosemide (LASIX) 20 MG tablet Take 20 mg by mouth daily as needed.  . [DISCONTINUED] metoprolol succinate (TOPROL-XL) 25 MG 24 hr tablet Take 1 tablet by mouth once daily     Review of Systems  All other systems reviewed and are otherwise negative except as noted above.  Physical Exam    VS:  BP 128/64   Pulse 61   Ht 4' 10.6" (1.488 m)   Wt 120 lb (54.4 kg)   BMI 24.57 kg/m  , BMI Body mass index is 24.57 kg/m.  Wt Readings from Last 3 Encounters:  12/30/20 120 lb (54.4 kg)  11/04/20 123 lb 4 oz (55.9 kg)  10/09/20 121 lb 0.5 oz (54.9 kg)    GEN: Well nourished, well developed, in no acute distress. HEENT: normal. Neck: Supple, no JVD, carotid bruits, or masses. Cardiac: IRIR, no murmurs, rubs, or gallops. No clubbing, cyanosis. Nonpitting bilateral pretibial edema.  Radials/PT 2+ and equal bilaterally.  Respiratory:  Respirations regular and unlabored, clear to auscultation  bilaterally. GI: Soft, nontender, nondistended. MS: No deformity or atrophy. Skin: Warm and dry, no rash. Varicose veins to bilateral lower extremity.  Neuro:  Strength and sensation are intact. Psych: Normal affect.  Assessment & Plan    1. Permanent atrial fibrillation on chronic anticoagulation - Rate controlled today by EKG 61bpm. Notes home heart rate as low as 44-55bpm. Reduce Toprol to 12.5mg  QD. Continue Xarelto 15mg  QD. Denies bleeding complications. CHA2DS2-VASc Score = 4 [CHF History: No, HTN History: Yes, Diabetes History: No, Stroke History: No, Vascular Disease History: No, Age Score: 2, Gender Score: 1].  Therefore, the patient's annual risk of stroke is 4.8 %.     2. HTN - BP well controlled. Continue current antihypertensive regimen.   3. Lower extremity edema / Venous insufficiency -  Improved with Lasix but has gotten worse since [facility] recommended change to PRN. No orthopnea, PND, shortness of breath - low suspicion for heart failure and no indication for  echocardiogram at this time. Change Lasix to 20mg  three times per week. BMP, BNP today to reassess. Encourage compression stockings, leg elevation, low salt diet.   Disposition: Follow up in 6 month(s) with Dr. [facility] or APP.   Signed, , NP 12/30/2020, 11:53 AM [facility]

## 2020-12-31 LAB — BASIC METABOLIC PANEL
BUN/Creatinine Ratio: 22 (ref 12–28)
BUN: 19 mg/dL (ref 10–36)
CO2: 21 mmol/L (ref 20–29)
Calcium: 9.5 mg/dL (ref 8.7–10.3)
Chloride: 102 mmol/L (ref 96–106)
Creatinine, Ser: 0.87 mg/dL (ref 0.57–1.00)
Glucose: 72 mg/dL (ref 65–99)
Potassium: 4.5 mmol/L (ref 3.5–5.2)
Sodium: 141 mmol/L (ref 134–144)
eGFR: 62 mL/min/{1.73_m2} (ref >59)

## 2020-12-31 LAB — BRAIN NATRIURETIC PEPTIDE: BNP: 308.8 pg/mL — ABNORMAL HIGH (ref 0.0–100.0)

## 2021-01-01 ENCOUNTER — Telehealth: Payer: Self-pay | Admitting: *Deleted

## 2021-01-01 NOTE — Telephone Encounter (Signed)
-----   Message from Alver Sorrow, NP sent at 12/31/2020  4:51 PM EDT ----- Stable kidney function. Normal electrolytes. BNP improved compared to previous but still elevated. Recommend Furosemide (Lasix) three times per week as discussed in clinic.

## 2021-01-01 NOTE — Telephone Encounter (Signed)
Attempted to call pt to review results and provider's recc below.  No answer. No voicemail set up, unable to leave message.  Will try to reach pt at later time.  Sent MyChart message with result and asking pt to call back with any questions.

## 2021-03-01 ENCOUNTER — Other Ambulatory Visit: Payer: Self-pay

## 2021-03-01 ENCOUNTER — Encounter: Payer: Self-pay | Admitting: Podiatry

## 2021-03-01 ENCOUNTER — Ambulatory Visit: Payer: PPO | Admitting: Podiatry

## 2021-03-01 ENCOUNTER — Other Ambulatory Visit: Payer: Self-pay | Admitting: Family Medicine

## 2021-03-01 DIAGNOSIS — B351 Tinea unguium: Secondary | ICD-10-CM

## 2021-03-01 DIAGNOSIS — M79676 Pain in unspecified toe(s): Secondary | ICD-10-CM

## 2021-03-01 NOTE — Progress Notes (Signed)
She presents with her daughter today chief complaint of painfully elongated toenails.  Objective: Toenails are long thick yellow dystrophic-like mycotic painful incurvated.  Pulses are palpable no open lesions or wounds.  Assessment: Pain limb secondary to onychomycosis.  Plan: Debridement of toenails 1 through 5 bilateral.

## 2021-03-14 ENCOUNTER — Other Ambulatory Visit: Payer: Self-pay | Admitting: Internal Medicine

## 2021-03-14 DIAGNOSIS — I4821 Permanent atrial fibrillation: Secondary | ICD-10-CM

## 2021-03-16 ENCOUNTER — Other Ambulatory Visit: Payer: Self-pay

## 2021-03-16 DIAGNOSIS — I4821 Permanent atrial fibrillation: Secondary | ICD-10-CM

## 2021-03-16 MED ORDER — AMLODIPINE BESYLATE 5 MG PO TABS: 5.0000 mg | ORAL_TABLET | Freq: Every day | ORAL | 0 refills | Status: DC

## 2021-03-16 NOTE — Telephone Encounter (Signed)
Refill Request.  

## 2021-03-16 NOTE — Telephone Encounter (Signed)
49f, 54.4 kg, lovw/walker 12/30/20, ccr 34 Creatinine, Ser 12/30/20 0.57 - 1.00 mg/dL 9.23

## 2021-03-16 NOTE — Telephone Encounter (Signed)
*  STAT* If patient is at the pharmacy, call can be transferred to refill team.   1. Which medications need to be refilled? (please list name of each medication and dose if known) Xarelto and Amlodipine  2. Which pharmacy/location (including street and city if local pharmacy) is medication to be sent to? [facility] [address]  3. Do they need a 30 day or 90 day supply? 30 day of Xarelto and 90 day of Amlodipine

## 2021-04-02 ENCOUNTER — Telehealth: Payer: Self-pay

## 2021-04-02 ENCOUNTER — Emergency Department: Payer: PPO

## 2021-04-02 ENCOUNTER — Inpatient Hospital Stay: Payer: PPO

## 2021-04-02 ENCOUNTER — Other Ambulatory Visit: Payer: Self-pay

## 2021-04-02 ENCOUNTER — Inpatient Hospital Stay
Admission: EM | Admit: 2021-04-02 | Discharge: 2021-04-06 | DRG: 389 | Disposition: A | Discharge status: Home Health | Payer: PPO | Attending: Surgery | Admitting: Surgery

## 2021-04-02 DIAGNOSIS — Z20822 Contact with and (suspected) exposure to covid-19: Secondary | ICD-10-CM | POA: Diagnosis present

## 2021-04-02 DIAGNOSIS — Z833 Family history of diabetes mellitus: Secondary | ICD-10-CM

## 2021-04-02 DIAGNOSIS — Z87891 Personal history of nicotine dependence: Secondary | ICD-10-CM | POA: Diagnosis not present

## 2021-04-02 DIAGNOSIS — H3530 Unspecified macular degeneration: Secondary | ICD-10-CM | POA: Diagnosis not present

## 2021-04-02 DIAGNOSIS — I4891 Unspecified atrial fibrillation: Secondary | ICD-10-CM

## 2021-04-02 DIAGNOSIS — R001 Bradycardia, unspecified: Secondary | ICD-10-CM | POA: Diagnosis not present

## 2021-04-02 DIAGNOSIS — Z90710 Acquired absence of both cervix and uterus: Secondary | ICD-10-CM | POA: Diagnosis not present

## 2021-04-02 DIAGNOSIS — S22080A Wedge compression fracture of T11-T12 vertebra, initial encounter for closed fracture: Secondary | ICD-10-CM | POA: Diagnosis not present

## 2021-04-02 DIAGNOSIS — K5939 Other megacolon: Secondary | ICD-10-CM | POA: Diagnosis not present

## 2021-04-02 DIAGNOSIS — K56609 Unspecified intestinal obstruction, unspecified as to partial versus complete obstruction: Secondary | ICD-10-CM | POA: Diagnosis present

## 2021-04-02 DIAGNOSIS — Z4682 Encounter for fitting and adjustment of non-vascular catheter: Secondary | ICD-10-CM | POA: Diagnosis not present

## 2021-04-02 DIAGNOSIS — M4186 Other forms of scoliosis, lumbar region: Secondary | ICD-10-CM | POA: Diagnosis present

## 2021-04-02 DIAGNOSIS — I119 Hypertensive heart disease without heart failure: Secondary | ICD-10-CM | POA: Diagnosis present

## 2021-04-02 DIAGNOSIS — Z7901 Long term (current) use of anticoagulants: Secondary | ICD-10-CM | POA: Diagnosis not present

## 2021-04-02 DIAGNOSIS — Z888 Allergy status to other drugs, medicaments and biological substances status: Secondary | ICD-10-CM

## 2021-04-02 DIAGNOSIS — R0902 Hypoxemia: Secondary | ICD-10-CM | POA: Diagnosis not present

## 2021-04-02 DIAGNOSIS — I4821 Permanent atrial fibrillation: Secondary | ICD-10-CM | POA: Diagnosis not present

## 2021-04-02 DIAGNOSIS — R42 Dizziness and giddiness: Secondary | ICD-10-CM | POA: Diagnosis not present

## 2021-04-02 DIAGNOSIS — Z9049 Acquired absence of other specified parts of digestive tract: Secondary | ICD-10-CM

## 2021-04-02 DIAGNOSIS — Z887 Allergy status to serum and vaccine status: Secondary | ICD-10-CM | POA: Diagnosis not present

## 2021-04-02 DIAGNOSIS — M810 Age-related osteoporosis without current pathological fracture: Secondary | ICD-10-CM | POA: Diagnosis present

## 2021-04-02 DIAGNOSIS — Z79899 Other long term (current) drug therapy: Secondary | ICD-10-CM

## 2021-04-02 DIAGNOSIS — I700 Atherosclerosis of aorta: Secondary | ICD-10-CM | POA: Diagnosis not present

## 2021-04-02 DIAGNOSIS — I10 Essential (primary) hypertension: Secondary | ICD-10-CM | POA: Diagnosis not present

## 2021-04-02 DIAGNOSIS — I455 Other specified heart block: Secondary | ICD-10-CM | POA: Diagnosis not present

## 2021-04-02 DIAGNOSIS — K5790 Diverticulosis of intestine, part unspecified, without perforation or abscess without bleeding: Secondary | ICD-10-CM | POA: Diagnosis not present

## 2021-04-02 DIAGNOSIS — Z91040 Latex allergy status: Secondary | ICD-10-CM

## 2021-04-02 DIAGNOSIS — M4854XA Collapsed vertebra, not elsewhere classified, thoracic region, initial encounter for fracture: Secondary | ICD-10-CM | POA: Diagnosis not present

## 2021-04-02 DIAGNOSIS — Z0189 Encounter for other specified special examinations: Secondary | ICD-10-CM

## 2021-04-02 DIAGNOSIS — R1084 Generalized abdominal pain: Secondary | ICD-10-CM | POA: Diagnosis not present

## 2021-04-02 DIAGNOSIS — Z885 Allergy status to narcotic agent status: Secondary | ICD-10-CM | POA: Diagnosis not present

## 2021-04-02 DIAGNOSIS — I959 Hypotension, unspecified: Secondary | ICD-10-CM | POA: Diagnosis not present

## 2021-04-02 DIAGNOSIS — K5651 Intestinal adhesions [bands], with partial obstruction: Secondary | ICD-10-CM | POA: Diagnosis not present

## 2021-04-02 DIAGNOSIS — N281 Cyst of kidney, acquired: Secondary | ICD-10-CM | POA: Diagnosis not present

## 2021-04-02 LAB — PROTIME-INR
INR: 1.6 — ABNORMAL HIGH (ref 0.8–1.2)
Prothrombin Time: 19.4 seconds — ABNORMAL HIGH (ref 11.4–15.2)

## 2021-04-02 LAB — CBC
HCT: 42.5 % (ref 36.0–46.0)
Hemoglobin: 14.2 g/dL (ref 12.0–15.0)
MCH: 26.7 pg (ref 26.0–34.0)
MCHC: 33.4 g/dL (ref 30.0–36.0)
MCV: 79.9 fL — ABNORMAL LOW (ref 80.0–100.0)
Platelets: 219 10*3/uL (ref 150–400)
RBC: 5.32 MIL/uL — ABNORMAL HIGH (ref 3.87–5.11)
RDW: 16.3 % — ABNORMAL HIGH (ref 11.5–15.5)
WBC: 9.2 10*3/uL (ref 4.0–10.5)
nRBC: 0 % (ref 0.0–0.2)

## 2021-04-02 LAB — RESP PANEL BY RT-PCR (FLU A&B, COVID) ARPGX2
Influenza A by PCR: NEGATIVE
Influenza B by PCR: NEGATIVE
SARS Coronavirus 2 by RT PCR: NEGATIVE

## 2021-04-02 LAB — COMPREHENSIVE METABOLIC PANEL
ALT: 9 U/L (ref 0–44)
AST: 27 U/L (ref 15–41)
Albumin: 4 g/dL (ref 3.5–5.0)
Alkaline Phosphatase: 92 U/L (ref 38–126)
Anion gap: 9 (ref 5–15)
BUN: 24 mg/dL — ABNORMAL HIGH (ref 8–23)
CO2: 26 mmol/L (ref 22–32)
Calcium: 9.5 mg/dL (ref 8.9–10.3)
Chloride: 102 mmol/L (ref 98–111)
Creatinine, Ser: 1 mg/dL (ref 0.44–1.00)
GFR, Estimated: 52 mL/min — ABNORMAL LOW (ref >60)
Glucose, Bld: 149 mg/dL — ABNORMAL HIGH (ref 70–99)
Potassium: 4.9 mmol/L (ref 3.5–5.1)
Sodium: 137 mmol/L (ref 135–145)
Total Bilirubin: 0.9 mg/dL (ref 0.3–1.2)
Total Protein: 7.3 g/dL (ref 6.5–8.1)

## 2021-04-02 LAB — URINALYSIS, COMPLETE (UACMP) WITH MICROSCOPIC
Bacteria, UA: NONE SEEN
Bilirubin Urine: NEGATIVE
Glucose, UA: NEGATIVE mg/dL
Hgb urine dipstick: NEGATIVE
Ketones, ur: NEGATIVE mg/dL
Leukocytes,Ua: NEGATIVE
Nitrite: NEGATIVE
Protein, ur: NEGATIVE mg/dL
Specific Gravity, Urine: >1.046 — ABNORMAL HIGH (ref 1.005–1.030)
pH: 5 (ref 5.0–8.0)

## 2021-04-02 LAB — LIPASE, BLOOD: Lipase: 37 U/L (ref 11–51)

## 2021-04-02 LAB — APTT: aPTT: 32 seconds (ref 24–36)

## 2021-04-02 MED ORDER — DIATRIZOATE MEGLUMINE & SODIUM 66-10 % PO SOLN
90.0000 mL | Freq: Once | ORAL | Status: AC
  Administered 2021-04-03: 90 mL via NASOGASTRIC

## 2021-04-02 MED ORDER — DOCUSATE SODIUM 100 MG PO CAPS: 100.0000 mg | ORAL_CAPSULE | Freq: Two times a day (BID) | ORAL | Status: DC | PRN

## 2021-04-02 MED ORDER — AMLODIPINE BESYLATE 5 MG PO TABS
5.0000 mg | ORAL_TABLET | Freq: Every day | ORAL | Status: DC
  Administered 2021-04-03 – 2021-04-06 (×4): 5 mg via ORAL
  Filled 2021-04-02 (×4): qty 1

## 2021-04-02 MED ORDER — ONDANSETRON 4 MG PO TBDP: 4.0000 mg | ORAL_TABLET | Freq: Four times a day (QID) | ORAL | Status: DC | PRN

## 2021-04-02 MED ORDER — DOCUSATE SODIUM 100 MG PO CAPS
200.0000 mg | ORAL_CAPSULE | Freq: Every day | ORAL | Status: DC
  Administered 2021-04-03 – 2021-04-06 (×4): 200 mg via ORAL
  Filled 2021-04-02 (×4): qty 2

## 2021-04-02 MED ORDER — FUROSEMIDE 20 MG PO TABS
20.0000 mg | ORAL_TABLET | ORAL | Status: DC
  Administered 2021-04-05: 20 mg via ORAL
  Filled 2021-04-02: qty 1

## 2021-04-02 MED ORDER — FLUTICASONE PROPIONATE 50 MCG/ACT NA SUSP: 1.0000 | Freq: Every day | NASAL | Status: DC

## 2021-04-02 MED ORDER — ONDANSETRON 4 MG PO TBDP
4.0000 mg | ORAL_TABLET | Freq: Once | ORAL | Status: AC
  Administered 2021-04-02: 4 mg via ORAL

## 2021-04-02 MED ORDER — FLUTICASONE PROPIONATE 50 MCG/ACT NA SUSP
1.0000 | Freq: Every day | NASAL | Status: DC
  Administered 2021-04-04 – 2021-04-06 (×3): 1 via NASAL
  Filled 2021-04-02: qty 16

## 2021-04-02 MED ORDER — ONDANSETRON 4 MG PO TBDP
ORAL_TABLET | ORAL | Status: AC
  Filled 2021-04-02: qty 1

## 2021-04-02 MED ORDER — HEPARIN BOLUS VIA INFUSION
2800.0000 [IU] | Freq: Once | INTRAVENOUS | Status: DC
  Filled 2021-04-02: qty 2800

## 2021-04-02 MED ORDER — HYDROCODONE-ACETAMINOPHEN 5-325 MG PO TABS
1.0000 | ORAL_TABLET | ORAL | Status: DC | PRN
  Administered 2021-04-03 (×2): 1 via ORAL
  Administered 2021-04-03: 2 via ORAL
  Filled 2021-04-02: qty 1
  Filled 2021-04-02: qty 2
  Filled 2021-04-02: qty 1

## 2021-04-02 MED ORDER — METOPROLOL SUCCINATE ER 25 MG PO TB24
12.5000 mg | ORAL_TABLET | Freq: Every day | ORAL | Status: DC
  Administered 2021-04-03: 12.5 mg via ORAL
  Filled 2021-04-02: qty 1
  Filled 2021-04-02: qty 0.5

## 2021-04-02 MED ORDER — ONDANSETRON HCL 4 MG/2ML IJ SOLN: 4.0000 mg | Freq: Four times a day (QID) | INTRAMUSCULAR | Status: DC | PRN

## 2021-04-02 MED ORDER — MORPHINE SULFATE (PF) 2 MG/ML IV SOLN
2.0000 mg | INTRAVENOUS | Status: DC | PRN
  Filled 2021-04-02: qty 1

## 2021-04-02 MED ORDER — HEPARIN (PORCINE) 25000 UT/250ML-% IV SOLN: 800.0000 [IU]/h | INTRAVENOUS | Status: DC

## 2021-04-02 MED ORDER — HEPARIN (PORCINE) 25000 UT/250ML-% IV SOLN
INTRAVENOUS | Status: AC
  Filled 2021-04-02: qty 250

## 2021-04-02 MED ORDER — HEPARIN BOLUS VIA INFUSION
2800.0000 [IU] | Freq: Once | INTRAVENOUS | Status: AC
  Administered 2021-04-02: 2800 [IU] via INTRAVENOUS
  Filled 2021-04-02: qty 2800

## 2021-04-02 MED ORDER — HEPARIN (PORCINE) 25000 UT/250ML-% IV SOLN
800.0000 [IU]/h | INTRAVENOUS | Status: DC
  Administered 2021-04-02: 800 [IU]/h via INTRAVENOUS
  Administered 2021-04-04: 950 [IU]/h via INTRAVENOUS
  Filled 2021-04-02: qty 250

## 2021-04-02 MED ORDER — LISINOPRIL 20 MG PO TABS
40.0000 mg | ORAL_TABLET | Freq: Every day | ORAL | Status: DC
  Administered 2021-04-03 – 2021-04-06 (×4): 40 mg via ORAL
  Filled 2021-04-02 (×4): qty 2

## 2021-04-02 MED ORDER — SODIUM CHLORIDE 0.9 % IV SOLN: Freq: Once | INTRAVENOUS | Status: AC

## 2021-04-02 MED ORDER — IOHEXOL 300 MG/ML  SOLN
75.0000 mL | Freq: Once | INTRAMUSCULAR | Status: AC | PRN
  Administered 2021-04-02: 75 mL via INTRAVENOUS
  Filled 2021-04-02: qty 75

## 2021-04-02 NOTE — ED Triage Notes (Signed)
Pt comes from home via EMS- was given 200 mls nacl en route. Pt is AOX4, NAD noted. Pt c/o N/V x2 since this morning. Pt denies CP, SOB, dizziness, diarrhea.

## 2021-04-02 NOTE — H&P (Signed)
Subjective:   CC: SBO  HPI:  Paula Luna is a 87 y.o. female who was consulted by Roxan Hockey for issue above.  Symptoms were first noted 1 day ago. Pain is minimal discomfort, confined to the periumbilical area, without radiation.  Associated with N/V, exacerbated by nothing specific.  Last BM was this am, small, discomfort and nausea started shortly after.    Also endorses hx of fall one week ago on bottom, no pain or complaints from it. Still walking with walker every day.     Past Medical History:  has a past medical history of Hypertension (12/25/2013), Ischemic bowel disease (HCC), Macular degeneration of left eye (01/06/2015), Osteoporosis, and Permanent atrial fibrillation (HCC).  Past Surgical History:  Past Surgical History:  Procedure Laterality Date   CLOSED REDUCTION PROXIMAL HUMERUS FRACTURE Left 2017   LAPAROSCOPIC SMALL BOWEL RESECTION     LAPAROTOMY N/A 03/18/2017   Procedure: EXPLORATORY LAPAROTOMY, SMALL BOWEL RESECTION;  Surgeon: Leafy Ro, MD;  Location: [facility] ORS;  Service: General;  Laterality: N/A;   TOTAL ABDOMINAL HYSTERECTOMY      Family History: family history includes Cancer in her father; Diabetes in her mother.  Social History:  reports that she has quit smoking. She has never used smokeless tobacco. She reports that she does not drink alcohol and does not use drugs.  Current Medications:  Prior to Admission medications   Medication Sig Start Date End Date Taking? Authorizing Provider  Rivaroxaban (XARELTO) 15 MG TABS tablet TAKE 1 TABLET BY MOUTH ONCE DAILY WITH SUPPER 03/16/21   Alver Sorrow, NP  amLODipine (NORVASC) 5 MG tablet Take 1 tablet (5 mg total) by mouth daily. 03/16/21   End, Cristal Deer, MD  Cholecalciferol (VITAMIN D) 50 MCG (2000 UT) CAPS Take 1 capsule by mouth daily.    [provider]  docusate sodium (COLACE) 100 MG capsule Take 200 mg by mouth daily.    [provider]  fluticasone (FLONASE) 50 MCG/ACT nasal  spray Use 2 spray(s) in each nostril once daily 03/01/21   Copland, Karleen Hampshire, MD  furosemide (LASIX) 20 MG tablet Take 1 tablet (20 mg total) by mouth 3 (three) times a week. 12/30/20   Alver Sorrow, NP  lisinopril (ZESTRIL) 40 MG tablet Take 1 tablet by mouth once daily 03/03/20   End, Cristal Deer, MD  metoprolol succinate (TOPROL-XL) 25 MG 24 hr tablet Take 0.5 tablets (12.5 mg total) by mouth daily. 12/30/20   Alver Sorrow, NP  Multiple Vitamins-Minerals (ICAPS) CAPS Take 1 capsule by mouth 2 (two) times daily.     [provider]    Allergies:  Allergies as of 04/02/2021 - Review Complete 04/02/2021  Allergen Reaction Noted   Latex Rash 11/07/2011   Tetanus toxoid Anaphylaxis 05/10/2010   Tramadol Other (See Comments) 12/30/2020   Alendronate sodium  07/12/2010    ROS:  General: Denies weight loss, weight gain, fatigue, fevers, chills, and night sweats. Eyes: Denies blurry vision, double vision, eye pain, itchy eyes, and tearing. Ears: Denies hearing loss, earache, and ringing in ears. Nose: Denies sinus pain, congestion, infections, runny nose, and nosebleeds. Mouth/throat: Denies hoarseness, sore throat, bleeding gums, and difficulty swallowing. Heart: Denies chest pain, palpitations, racing heart, irregular heartbeat, leg pain or swelling, and decreased activity tolerance. Respiratory: Denies breathing difficulty, shortness of breath, wheezing, cough, and sputum. GI: Denies change in appetite, heartburn, nausea, vomiting, constipation, diarrhea, and blood in stool. GU: Denies difficulty urinating, pain with urinating, urgency, frequency, blood in urine.  Musculoskeletal: Denies joint stiffness, pain, swelling, muscle weakness. Skin: Denies rash, itching, mass, tumors, sores, and boils Neurologic: Denies headache, fainting, dizziness, seizures, numbness, and tingling. Psychiatric: Denies depression, anxiety, difficulty sleeping, and memory loss. Endocrine: Denies heat  or cold intolerance, and increased thirst or urination. Blood/lymph: Denies easy bruising, easy bruising, and swollen glands     Objective:     BP (!) 128/58   Pulse 64   Temp 97.9 F (36.6 C) (Oral)   Resp 18   Ht 4\' 11"  (1.499 m)   SpO2 100%   BMI 24.24 kg/m   Constitutional :  alert, cooperative, appears stated age, and no distress  Lymphatics/Throat:  no asymmetry, masses, or scars  Respiratory:  clear to auscultation bilaterally  Cardiovascular:  regular rate and rhythm  Gastrointestinal: soft, non-tender; bowel sounds normal; no masses,  no organomegaly.   Musculoskeletal: Steady movement, sensation and motor intact BLE and symmetric.  Skin: Cool and moist.  Psychiatric: Normal affect, non-agitated, not confused       LABS:  CMP Latest Ref Rng & Units 04/02/2021 12/30/2020 11/05/2020  Glucose 70 - 99 mg/dL 11/07/2020) 72 295(J)  BUN 8 - 23 mg/dL 884(Z) 19 16  Creatinine 0.44 - 1.00 mg/dL 66(A 6.30 1.60  Sodium 135 - 145 mmol/L 137 141 141  Potassium 3.5 - 5.1 mmol/L 4.9 4.5 4.0  Chloride 98 - 111 mmol/L 102 102 105  CO2 22 - 32 mmol/L 26 21 29   Calcium 8.9 - 10.3 mg/dL 9.5 9.5 9.5  Total Protein 6.5 - 8.1 g/dL 7.3 - 6.4  Total Bilirubin 0.3 - 1.2 mg/dL 0.9 - 0.4  Alkaline Phos 38 - 126 U/L 92 - 83  AST 15 - 41 U/L 27 - 17  ALT 0 - 44 U/L 9 - 15   CBC Latest Ref Rng & Units 04/02/2021 11/05/2020 10/08/2020  WBC 4.0 - 10.5 K/uL 9.2 6.3 9.6  Hemoglobin 12.0 - 15.0 g/dL 11/07/2020 11.7(L) 12.0  Hematocrit 36.0 - 46.0 % 42.5 35.7(L) 36.9  Platelets 150 - 400 K/uL 219 179.0 223    RADS: CLINICAL DATA:  Bowel obstruction suspected   EXAM: CT ABDOMEN AND PELVIS WITH CONTRAST   TECHNIQUE: Multidetector CT imaging of the abdomen and pelvis was performed using the standard protocol following bolus administration of intravenous contrast.   CONTRAST:  10mL OMNIPAQUE IOHEXOL 300 MG/ML  SOLN   COMPARISON:  October 01, 2020   FINDINGS: Lower chest: Cardiomegaly.  Scattered  bibasilar atelectasis.   Hepatobiliary: No focal liver abnormality is seen. No gallstones, gallbladder wall thickening, or biliary dilatation.   Pancreas: Unremarkable. No pancreatic ductal dilatation or surrounding inflammatory changes.   Spleen: Normal in size without focal abnormality.   Adrenals/Urinary Tract: Adrenal glands are unremarkable. No hydronephrosis. Multiple parapelvic cysts. Bladder is unremarkable.   Stomach/Bowel: There is a small-bowel obstruction with transition point in the mid lower abdomen near site of multiple surgical sutures (series 5, image 23; series 2, image 55). Small bowel measures approximately 3.4 cm. No evidence of bowel ischemia. Diverticulosis.   Vascular/Lymphatic: Severe atherosclerotic calcifications. No new suspicious lymphadenopathy.   Reproductive: Status post hysterectomy. Unchanged 12 mm LEFT adnexal cyst   Other: No free air or free fluid.   Musculoskeletal: New severe compression fracture deformity of T11. Dextroscoliosis of the lumbar spine.   IMPRESSION: 1. There is a small bowel obstruction with transition point in the lower mid abdomen just upstream of multiple surgical sutures. 2. New compression fracture deformity of T11.  Aortic Atherosclerosis (ICD10-I70.0).     Electronically Signed   By: Meda Klinefelter MD   On: 04/02/2021 18:54   Assessment:   SBO- recurrent likely from adhesions from previous surgery Afib on xarelto Subacute T12 compression fx incidental- asymptomatic and no neuro deficit on exam  Plan:   NG, SBFT, IVF, NPO for SBO.  Will try to avoid repeat surgery as much as possible  Switch xarelto to hep drip in case additional intervention needed. Pharm consulted  Consulted Dr. Adriana Simas, neurosurg re: T12 compression fracture, explained asymptomatic nature and verteberal body width <1/3 of original width based on CT.  He recommended TLSO brace while in house, f/u as an outpt as long as she remains  stable.  HTN- continue home meds   DNR per previous report and patient verbalizing no change to status.  Will confirm with daughter once able to reach her.

## 2021-04-02 NOTE — Telephone Encounter (Signed)
[name] said at breakfast this morning pt did not eat anything but drank small amount of coffee and pt was complaining of abd pain where had bowel obstruction 09/2020. Right after drank coffee pt felt like she needed to have BM and went to bathroom. Pt could not have BM, was having abd pain; [name] said pt was feeling dizzy and then [name] could see pts eyes going to back of head. [name] applied cool cloth to back of neck and pt did lose consciousness for short time about 10:43. Pt came back around and pt has vomited x 3 in approx one hour; first vomitus was yellow and last 2 vomitus has been brown like when pt had the bowel obstruction. [name] said she thinks pt needs to go to ED and [name] wants her to go to [facility] ED. [name] first said she was going to take her by car but then [name] said she was getting nervous and could I call 911 for her and I advised yes. I called 911 and they will send EMS to pts home now. I called [name] and let her know and she was very appreciative and wanted to make sure that Dr Patsy Lager would be notified. I advised I would send Dr Patsy Lager and [name] CMA a note now. Armanda Heritage if pt condition changed or worsened prior to ambulance getting there to call 911 and let them know. [name] voiced understanding. Sending note to Dr Patsy Lager and [name] CMA.

## 2021-04-02 NOTE — ED Notes (Signed)
Ortho tech paged out, said it would be 2-3 hours.

## 2021-04-02 NOTE — Progress Notes (Addendum)
ANTICOAGULATION CONSULT NOTE  Pharmacy Consult for Heparin Indication: atrial fibrillation  Allergies  Allergen Reactions   Latex Rash   Tetanus Toxoid Anaphylaxis    REACTION: anaphylaxis   Tramadol Other (See Comments)    Caused severe constipation---bowel obstruction   Alendronate Sodium     REACTION: passed out    Patient Measurements: Height: 4\' 11"  (149.9 cm) IBW/kg (Calculated) : 43.2 Heparin Dosing Weight: 55.77 kg  Vital Signs: Temp: 97.9 F (36.6 C) (07/22 1318) Temp Source: Oral (07/22 1318) BP: 128/58 (07/22 1318) Pulse Rate: 64 (07/22 1318)  Labs: Recent Labs    04/02/21 1322  HGB 14.2  HCT 42.5  PLT 219  CREATININE 1.00    CrCl cannot be calculated (Unknown ideal weight.).   Medical History: Past Medical History:  Diagnosis Date   Hypertension 12/25/2013   Ischemic bowel disease (HCC)    a. 03/2017 abd pain-->internal herina and ischemic jejunum s/p ex lap and small bowel resection.   Macular degeneration of left eye 01/06/2015   Osteoporosis    Permanent atrial fibrillation (HCC)    a. First noted 03/2017;  b. 07/2017 Echo: EF 60-65%, no rwma, mild MR, mildly to mod dil LA; c. 07/2017 48hr Holter: persistent Afib, avg HR of 66 (range 39-140 bpm);  d. CHA2DS2VASc = 4-->Xarelto initiated 07/2017.    Medications:  Rivaroxaban 15 mg daily with supper - last dose (per daughter) was 7/21 around dinner time  Assessment: 87 yo female with medical history for HTN, afib (on Xarelto), and osteoporosis. Pt seen by surgery who noted small SBO on CT abdomen. Pharmacy has been consulted for heparin dosing and monitoring in case patient may need surgery.   Baseline aPTT and PT-INR added on. H/H and Plt WNL  Goal of Therapy:  Heparin level 0.3-0.7 units/ml - once correlating with aPTT aPTT 66-102 seconds Monitor platelets by anticoagulation protocol: Yes   Plan:  Give 2800 units bolus x 1 Start heparin infusion at 800 units/hr Check aPTT level in 8 hours  (would be due ~0600 but will order for 0500 to avoid having to stick patient twice) and anti-Xa daily while on heparin Will switch to monitoring anti-Xa level once correlating with aPTT Continue to monitor H&H and platelets  Cordarious Zeek O Cammi Consalvo 04/02/2021,8:21 PM

## 2021-04-02 NOTE — ED Provider Notes (Signed)
[facility] Emergency Department Provider Note    Event Date/Time   First MD Initiated Contact with Patient 04/02/21 1751     (approximate)  I have reviewed the triage vital signs and the nursing notes.   HISTORY  Chief Complaint Abdominal Pain    HPI Paula Luna is a 87 y.o. female with below listed past medical history presents to the ER for evaluation of nausea vomiting and abdominal discomfort starting this morning is not been able to keep anything down.  Does have a history of internal hernia and previous bowel resection and subsequent small bowel obstruction.  Past Medical History:  Diagnosis Date   Hypertension 12/25/2013   Ischemic bowel disease (HCC)    a. 03/2017 abd pain-->internal herina and ischemic jejunum s/p ex lap and small bowel resection.   Macular degeneration of left eye 01/06/2015   Osteoporosis    Permanent atrial fibrillation (HCC)    a. First noted 03/2017;  b. 07/2017 Echo: EF 60-65%, no rwma, mild MR, mildly to mod dil LA; c. 07/2017 48hr Holter: persistent Afib, avg HR of 66 (range 39-140 bpm);  d. CHA2DS2VASc = 4-->Xarelto initiated 07/2017.   Family History  Problem Relation Age of Onset   Diabetes Mother    Cancer Father        jaw   Past Surgical History:  Procedure Laterality Date   CLOSED REDUCTION PROXIMAL HUMERUS FRACTURE Left 2017   LAPAROSCOPIC SMALL BOWEL RESECTION     LAPAROTOMY N/A 03/18/2017   Procedure: EXPLORATORY LAPAROTOMY, SMALL BOWEL RESECTION;  Surgeon: Leafy Ro, MD;  Location: [facility] ORS;  Service: General;  Laterality: N/A;   TOTAL ABDOMINAL HYSTERECTOMY     Patient Active Problem List   Diagnosis Date Noted   Malnutrition of moderate degree 10/05/2020   SBO (small bowel obstruction) (HCC) 10/01/2020   Hypokalemia 10/01/2020   Atrial fibrillation (HCC) 04/26/2017   Ischemic bowel disease (HCC) 03/18/2017   Ischemic necrosis of small bowel (HCC)    Macular degeneration of left eye  01/06/2015   Essential hypertension 12/25/2013      Prior to Admission medications   Medication Sig Start Date End Date Taking? Authorizing Provider  Rivaroxaban (XARELTO) 15 MG TABS tablet TAKE 1 TABLET BY MOUTH ONCE DAILY WITH SUPPER 03/16/21   Alver Sorrow, NP  amLODipine (NORVASC) 5 MG tablet Take 1 tablet (5 mg total) by mouth daily. 03/16/21   End, Cristal Deer, MD  Cholecalciferol (VITAMIN D) 50 MCG (2000 UT) CAPS Take 1 capsule by mouth daily.    [provider]  docusate sodium (COLACE) 100 MG capsule Take 200 mg by mouth daily.    [provider]  fluticasone (FLONASE) 50 MCG/ACT nasal spray Use 2 spray(s) in each nostril once daily 03/01/21   Copland, Karleen Hampshire, MD  furosemide (LASIX) 20 MG tablet Take 1 tablet (20 mg total) by mouth 3 (three) times a week. 12/30/20   Alver Sorrow, NP  lisinopril (ZESTRIL) 40 MG tablet Take 1 tablet by mouth once daily 03/03/20   End, Cristal Deer, MD  metoprolol succinate (TOPROL-XL) 25 MG 24 hr tablet Take 0.5 tablets (12.5 mg total) by mouth daily. 12/30/20   Alver Sorrow, NP  Multiple Vitamins-Minerals (ICAPS) CAPS Take 1 capsule by mouth 2 (two) times daily.     [provider]    Allergies Latex, Tetanus toxoid, Tramadol, and Alendronate sodium    Social History Social History   Tobacco Use   Smoking status: Former  Smokeless tobacco: Never  Vaping Use   Vaping Use: Never used  Substance Use Topics   Alcohol use: No   Drug use: No    Review of Systems Patient denies headaches, rhinorrhea, blurry vision, numbness, shortness of breath, chest pain, edema, cough, abdominal pain, nausea, vomiting, diarrhea, dysuria, fevers, rashes or hallucinations unless otherwise stated above in HPI. ____________________________________________   PHYSICAL EXAM:  VITAL SIGNS: Vitals:   04/02/21 1318  BP: (!) 128/58  Pulse: 64  Resp: 18  Temp: 97.9 F (36.6 C)  SpO2: 100%    Constitutional: Alert and  oriented.  Eyes: Conjunctivae are normal.  Head: Atraumatic. Nose: No congestion/rhinnorhea. Mouth/Throat: Mucous membranes are moist.   Neck: No stridor. Painless ROM.  Cardiovascular: Normal rate, regular rhythm. Grossly normal heart sounds.  Good peripheral circulation. Respiratory: Normal respiratory effort.  No retractions. Lungs CTAB. Gastrointestinal: Soft and nontender. No distention. No abdominal bruits. No CVA tenderness. Genitourinary:  Musculoskeletal: No lower extremity tenderness nor edema.  No joint effusions. Neurologic:  Normal speech and language. No gross focal neurologic deficits are appreciated. No facial droop Skin:  Skin is warm, dry and intact. No rash noted. Psychiatric: Mood and affect are normal. Speech and behavior are normal.  ____________________________________________   LABS (all labs ordered are listed, but only abnormal results are displayed)  Results for orders placed or performed during the hospital encounter of 04/02/21 (from the past 24 hour(s))  Lipase, blood     Status: None   Collection Time: 04/02/21  1:22 PM  Result Value Ref Range   Lipase 37 11 - 51 U/L  Comprehensive metabolic panel     Status: Abnormal   Collection Time: 04/02/21  1:22 PM  Result Value Ref Range   Sodium 137 135 - 145 mmol/L   Potassium 4.9 3.5 - 5.1 mmol/L   Chloride 102 98 - 111 mmol/L   CO2 26 22 - 32 mmol/L   Glucose, Bld 149 (H) 70 - 99 mg/dL   BUN 24 (H) 8 - 23 mg/dL   Creatinine, Ser 8.25 0.44 - 1.00 mg/dL   Calcium 9.5 8.9 - 05.3 mg/dL   Total Protein 7.3 6.5 - 8.1 g/dL   Albumin 4.0 3.5 - 5.0 g/dL   AST 27 15 - 41 U/L   ALT 9 0 - 44 U/L   Alkaline Phosphatase 92 38 - 126 U/L   Total Bilirubin 0.9 0.3 - 1.2 mg/dL   GFR, Estimated 52 (L) >60 mL/min   Anion gap 9 5 - 15  CBC     Status: Abnormal   Collection Time: 04/02/21  1:22 PM  Result Value Ref Range   WBC 9.2 4.0 - 10.5 K/uL   RBC 5.32 (H) 3.87 - 5.11 MIL/uL   Hemoglobin 14.2 12.0 - 15.0 g/dL    HCT 97.6 73.4 - 19.3 %   MCV 79.9 (L) 80.0 - 100.0 fL   MCH 26.7 26.0 - 34.0 pg   MCHC 33.4 30.0 - 36.0 g/dL   RDW 79.0 (H) 24.0 - 97.3 %   Platelets 219 150 - 400 K/uL   nRBC 0.0 0.0 - 0.2 %   ____________________________________________  EKG My review and personal interpretation at Time: 13:27   Indication: abd pain  Rate: 60  Rhythm: afib Axis: normal Other: nonspecific st abn ____________________________________________  RADIOLOGY  I personally reviewed all radiographic images ordered to evaluate for the above acute complaints and reviewed radiology reports and findings.  These findings were personally discussed with the  patient.  Please see medical record for radiology report.  ____________________________________________   PROCEDURES  Procedure(s) performed:  Procedures    Critical Care performed: no ____________________________________________   INITIAL IMPRESSION / ASSESSMENT AND PLAN / ED COURSE  Pertinent labs & imaging results that were available during my care of the patient were reviewed by me and considered in my medical decision making (see chart for details).   DDX: sbo, enteritis, colitis, dehydration  Allyn D Pasley is a 87 y.o. who presents to the ED with nausea vomiting as described above.  CT imaging ordered for above differential as I am concerned for small bowel obstruction.  She not complaining of any pain right now denying any nausea or vomiting but just filled a emesis bag.  He is protecting her airway.  Clinical Course as of 04/02/21 1929  Caleen Essex Apr 02, 2021  1660 CT via my review is concerning for small bowel obstruction. [PR]  1916 Case discussed in consultation with Dr. Tonna Boehringer of general surgery who agrees with plan for NG tube and will admit patient for further medical management.  Have discussed with the patient and available family all diagnostics and treatments performed thus far and all questions were answered to the best of my  ability. The patient demonstrates understanding and agreement with plan.  [PR]    Clinical Course User Index [PR] Willy Eddy, MD    The patient was evaluated in Emergency Department today for the symptoms described in the history of present illness. He/she was evaluated in the context of the global COVID-19 pandemic, which necessitated consideration that the patient might be at risk for infection with the SARS-CoV-2 virus that causes COVID-19. Institutional protocols and algorithms that pertain to the evaluation of patients at risk for COVID-19 are in a state of rapid change based on information released by regulatory bodies including the CDC and federal and state organizations. These policies and algorithms were followed during the patient's care in the ED.  As part of my medical decision making, I reviewed the following data within the electronic MEDICAL RECORD NUMBER Nursing notes reviewed and incorporated, Labs reviewed, notes from prior ED visits and [place] Controlled Substance Database   ____________________________________________   FINAL CLINICAL IMPRESSION(S) / ED DIAGNOSES  Final diagnoses:  Small bowel obstruction (HCC)      NEW MEDICATIONS STARTED DURING THIS VISIT:  New Prescriptions   No medications on file     Note:  This document was prepared using Dragon voice recognition software and may include unintentional dictation errors.    Willy Eddy, MD 04/02/21 [phone]

## 2021-04-02 NOTE — Progress Notes (Signed)
Orthopedic Tech Progress Note Patient Details:  Paula Luna 03/09/1925 [id number] Ordered brace Patient ID: [name], female   DOB: 24-Feb-1925, 87 y.o.   MRN: [id number]  [name] 04/02/2021, 9:46 PM

## 2021-04-02 NOTE — ED Notes (Signed)
Pt was given 4 mg zofran by EMS en route.

## 2021-04-03 ENCOUNTER — Inpatient Hospital Stay: Payer: PPO

## 2021-04-03 DIAGNOSIS — I455 Other specified heart block: Secondary | ICD-10-CM

## 2021-04-03 LAB — MAGNESIUM: Magnesium: 2.2 mg/dL (ref 1.7–2.4)

## 2021-04-03 LAB — BASIC METABOLIC PANEL
Anion gap: 7 (ref 5–15)
BUN: 25 mg/dL — ABNORMAL HIGH (ref 8–23)
CO2: 28 mmol/L (ref 22–32)
Calcium: 9.1 mg/dL (ref 8.9–10.3)
Chloride: 106 mmol/L (ref 98–111)
Creatinine, Ser: 1 mg/dL (ref 0.44–1.00)
GFR, Estimated: 52 mL/min — ABNORMAL LOW (ref >60)
Glucose, Bld: 114 mg/dL — ABNORMAL HIGH (ref 70–99)
Potassium: 4.5 mmol/L (ref 3.5–5.1)
Sodium: 141 mmol/L (ref 135–145)

## 2021-04-03 LAB — HEPARIN LEVEL (UNFRACTIONATED): Heparin Unfractionated: >1.1 IU/mL — ABNORMAL HIGH (ref 0.30–0.70)

## 2021-04-03 LAB — APTT
aPTT: 112 seconds — ABNORMAL HIGH (ref 24–36)
aPTT: 78 seconds — ABNORMAL HIGH (ref 24–36)
aPTT: 43 seconds — ABNORMAL HIGH (ref 24–36)

## 2021-04-03 LAB — CBC
HCT: 39.7 % (ref 36.0–46.0)
Hemoglobin: 12.9 g/dL (ref 12.0–15.0)
MCH: 26.2 pg (ref 26.0–34.0)
MCHC: 32.5 g/dL (ref 30.0–36.0)
MCV: 80.7 fL (ref 80.0–100.0)
Platelets: 147 10*3/uL — ABNORMAL LOW (ref 150–400)
RBC: 4.92 MIL/uL (ref 3.87–5.11)
RDW: 16.4 % — ABNORMAL HIGH (ref 11.5–15.5)
WBC: 6.7 10*3/uL (ref 4.0–10.5)
nRBC: 0 % (ref 0.0–0.2)

## 2021-04-03 LAB — PHOSPHORUS: Phosphorus: 3.9 mg/dL (ref 2.5–4.6)

## 2021-04-03 MED ORDER — HEPARIN BOLUS VIA INFUSION
1600.0000 [IU] | Freq: Once | INTRAVENOUS | Status: AC
  Administered 2021-04-03: 1600 [IU] via INTRAVENOUS
  Filled 2021-04-03: qty 1600

## 2021-04-03 MED ORDER — MENTHOL 3 MG MT LOZG
1.0000 | LOZENGE | OROMUCOSAL | Status: DC | PRN
  Filled 2021-04-03: qty 9

## 2021-04-03 NOTE — Progress Notes (Signed)
ANTICOAGULATION CONSULT NOTE  Pharmacy Consult for Heparin Indication: atrial fibrillation  Allergies  Allergen Reactions   Latex Rash   Tetanus Toxoid Anaphylaxis    REACTION: anaphylaxis   Tramadol Other (See Comments)    Caused severe constipation---bowel obstruction   Alendronate Sodium     REACTION: passed out    Patient Measurements: Height: 4\' 11"  (149.9 cm) Weight: 56.2 kg (123 lb 14.4 oz) IBW/kg (Calculated) : 43.2 Heparin Dosing Weight: 55.77 kg  Vital Signs: Temp: 98.1 F (36.7 C) (07/23 0016) Temp Source: Oral (07/23 0016) BP: 120/73 (07/23 0016) Pulse Rate: 81 (07/23 0016)  Labs: Recent Labs    04/02/21 1322 04/03/21 0434  HGB 14.2 12.9  HCT 42.5 39.7  PLT 219 147*  APTT 32 112*  LABPROT 19.4*  --   INR 1.6*  --   HEPARINUNFRC  --  >1.10*  CREATININE 1.00 1.00     Estimated Creatinine Clearance: 26.3 mL/min (by C-G formula based on SCr of 1 mg/dL).   Medical History: Past Medical History:  Diagnosis Date   Hypertension 12/25/2013   Ischemic bowel disease (HCC)    a. 03/2017 abd pain-->internal herina and ischemic jejunum s/p ex lap and small bowel resection.   Macular degeneration of left eye 01/06/2015   Osteoporosis    Permanent atrial fibrillation (HCC)    a. First noted 03/2017;  b. 07/2017 Echo: EF 60-65%, no rwma, mild MR, mildly to mod dil LA; c. 07/2017 48hr Holter: persistent Afib, avg HR of 66 (range 39-140 bpm);  d. CHA2DS2VASc = 4-->Xarelto initiated 07/2017.    Medications:  Rivaroxaban 15 mg daily with supper - last dose (per daughter) was 7/21 around dinner time  Assessment: 87 yo female with medical history for HTN, afib (on Xarelto), and osteoporosis. Pt seen by surgery who noted small SBO on CT abdomen. Pharmacy has been consulted for heparin dosing and monitoring in case patient may need surgery.   Baseline aPTT and PT-INR added on. H/H and Plt WNL  Goal of Therapy:  Heparin level 0.3-0.7 units/ml - once correlating with  aPTT aPTT 66-102 seconds Monitor platelets by anticoagulation protocol: Yes   Plan:  7/23  @ 0434:   aPTT = 112 (elevated),  HL = > 1.1 Will decrease heparin drip to 750 units/hr and recheck aPTT 8 hrs after rate change.  Will continue to use aPTT to guide dosing until aPTT and HL correlate.    Yahayra Geis D 04/03/2021,6:19 AM

## 2021-04-03 NOTE — Progress Notes (Signed)
ANTICOAGULATION CONSULT NOTE  Pharmacy Consult for Heparin Indication: atrial fibrillation  Allergies  Allergen Reactions   Latex Rash   Tetanus Toxoid Anaphylaxis    REACTION: anaphylaxis   Tramadol Other (See Comments)    Caused severe constipation---bowel obstruction   Alendronate Sodium     REACTION: passed out    Patient Measurements: Height: 4\' 11"  (149.9 cm) Weight: 56.2 kg (123 lb 14.4 oz) IBW/kg (Calculated) : 43.2 Heparin Dosing Weight: 55.77 kg  Vital Signs: Temp: 97.9 F (36.6 C) (07/23 0746) Temp Source: Oral (07/23 0746) BP: 136/80 (07/23 0746) Pulse Rate: 70 (07/23 0746)  Labs: Recent Labs    04/02/21 1322 04/03/21 0434 04/03/21 1414  HGB 14.2 12.9  --   HCT 42.5 39.7  --   PLT 219 147*  --   APTT 32 112* 78*  LABPROT 19.4*  --   --   INR 1.6*  --   --   HEPARINUNFRC  --  >1.10*  --   CREATININE 1.00 1.00  --      Estimated Creatinine Clearance: 26.3 mL/min (by C-G formula based on SCr of 1 mg/dL).   Medical History: Past Medical History:  Diagnosis Date   Hypertension 12/25/2013   Ischemic bowel disease (HCC)    a. 03/2017 abd pain-->internal herina and ischemic jejunum s/p ex lap and small bowel resection.   Macular degeneration of left eye 01/06/2015   Osteoporosis    Permanent atrial fibrillation (HCC)    a. First noted 03/2017;  b. 07/2017 Echo: EF 60-65%, no rwma, mild MR, mildly to mod dil LA; c. 07/2017 48hr Holter: persistent Afib, avg HR of 66 (range 39-140 bpm);  d. CHA2DS2VASc = 4-->Xarelto initiated 07/2017.    Medications:  Rivaroxaban 15 mg daily with supper - last dose (per daughter) was 7/21 around dinner time  Assessment: 87 yo female with medical history for HTN, afib (on Xarelto), and osteoporosis. Pt seen by surgery who noted small SBO on CT abdomen. Pharmacy has been consulted for heparin dosing and monitoring in case patient may need surgery.   Baseline aPTT and PT-INR added on. H/H and Plt WNL  7/23 1414 aPTT 78    Goal of Therapy:  Heparin level 0.3-0.7 units/ml - once correlating with aPTT aPTT 66-102 seconds Monitor platelets by anticoagulation protocol: Yes   Plan:  aPTT is therapeutic. Will continue heparin infusion at 750 units/hr. Recheck aPTT in 8 hours. CBC daily while on heparin. Will continue to use aPTT to guide dosing until aPTT and HL correlate.    8/23, PharmD 04/03/2021,3:10 PM

## 2021-04-03 NOTE — Progress Notes (Signed)
ANTICOAGULATION CONSULT NOTE  Pharmacy Consult for Heparin Indication: atrial fibrillation  Allergies  Allergen Reactions   Latex Rash   Tetanus Toxoid Anaphylaxis    REACTION: anaphylaxis   Tramadol Other (See Comments)    Caused severe constipation---bowel obstruction   Alendronate Sodium     REACTION: passed out    Patient Measurements: Height: 4\' 11"  (149.9 cm) Weight: 56.2 kg (123 lb 14.4 oz) IBW/kg (Calculated) : 43.2 Heparin Dosing Weight: 55.77 kg  Vital Signs: Temp: 98.1 F (36.7 C) (07/23 1945) Temp Source: Oral (07/23 1555) BP: 114/61 (07/23 1555) Pulse Rate: 51 (07/23 1555)  Labs: Recent Labs    04/02/21 1322 04/03/21 0434 04/03/21 1414 04/03/21 2154  HGB 14.2 12.9  --   --   HCT 42.5 39.7  --   --   PLT 219 147*  --   --   APTT 32 112* 78* 43*  LABPROT 19.4*  --   --   --   INR 1.6*  --   --   --   HEPARINUNFRC  --  >1.10*  --   --   CREATININE 1.00 1.00  --   --      Estimated Creatinine Clearance: 26.3 mL/min (by C-G formula based on SCr of 1 mg/dL).   Medical History: Past Medical History:  Diagnosis Date   Hypertension 12/25/2013   Ischemic bowel disease (HCC)    a. 03/2017 abd pain-->internal herina and ischemic jejunum s/p ex lap and small bowel resection.   Macular degeneration of left eye 01/06/2015   Osteoporosis    Permanent atrial fibrillation (HCC)    a. First noted 03/2017;  b. 07/2017 Echo: EF 60-65%, no rwma, mild MR, mildly to mod dil LA; c. 07/2017 48hr Holter: persistent Afib, avg HR of 66 (range 39-140 bpm);  d. CHA2DS2VASc = 4-->Xarelto initiated 07/2017.    Medications:  Rivaroxaban 15 mg daily with supper - last dose (per daughter) was 7/21 around dinner time  Assessment: 87 yo female with medical history for HTN, afib (on Xarelto), and osteoporosis. Pt seen by surgery who noted small SBO on CT abdomen. Pharmacy has been consulted for heparin dosing and monitoring in case patient may need surgery.   Baseline aPTT and  PT-INR added on. H/H and Plt WNL  7/23 1414 aPTT 78   Goal of Therapy:  Heparin level 0.3-0.7 units/ml - once correlating with aPTT aPTT 66-102 seconds Monitor platelets by anticoagulation protocol: Yes   Plan:  7/23: APTT @ 2154 = 43 Will order heparin 1600 units IV X 1 bolus and increase drip rate to 950 units/hr. Will recheck aPTT 8 hrs after rate change.   Lashondra Vaquerano D, PharmD 04/03/2021,10:46 PM

## 2021-04-03 NOTE — Consult Note (Signed)
Medical Consultation   Paula Luna  UDJ:[id number]  DOB: 14-Jul-1925  DOA: 04/02/2021  PCP: Hannah Beat, MD   Requesting physician: Dr. Tonna Boehringer  Reason for consultation: Sinus Pause, bradycardia  History of Present Illness: Paula Luna is an 87 y.o. female with a history of permanent A. fib on Xarelto, currently on heparin in-house, macular degeneration, history of ischemic bowel, admitted to the surgical service on 7/22 for SBO being managed conservatively who I was asked to see in consultation for sinus pauses of 2 seconds and bradycardia in the 40s to 50s.  Patient had no complaints during these episodes.  She was awake and alert and denied chest pain, shortness of breath or palpitations.  Denied lightheadedness, visual disturbance beyond her baseline for her macular degeneration and denies one-sided weakness numbness or tingling or headache.  She stated that she was improving well from her SBO.  Patient had an incidental finding on admission of a subacute T12 compression fracture and was seen in consultation by neurosurgeon Dr. Adriana Simas who recommended TLSO brace while in-house.  At the present time she denies related complaints.   Review of Systems:   As per HPI otherwise 10 point review of systems negative.   Past Medical History: Past Medical History:  Diagnosis Date   Hypertension 12/25/2013   Ischemic bowel disease (HCC)    a. 03/2017 abd pain-->internal herina and ischemic jejunum s/p ex lap and small bowel resection.   Macular degeneration of left eye 01/06/2015   Osteoporosis    Permanent atrial fibrillation (HCC)    a. First noted 03/2017;  b. 07/2017 Echo: EF 60-65%, no rwma, mild MR, mildly to mod dil LA; c. 07/2017 48hr Holter: persistent Afib, avg HR of 66 (range 39-140 bpm);  d. CHA2DS2VASc = 4-->Xarelto initiated 07/2017.    Past Surgical History: Past Surgical History:  Procedure Laterality Date   CLOSED REDUCTION PROXIMAL HUMERUS FRACTURE  Left 2017   LAPAROSCOPIC SMALL BOWEL RESECTION     LAPAROTOMY N/A 03/18/2017   Procedure: EXPLORATORY LAPAROTOMY, SMALL BOWEL RESECTION;  Surgeon: Leafy Ro, MD;  Location: [facility] ORS;  Service: General;  Laterality: N/A;   TOTAL ABDOMINAL HYSTERECTOMY       Allergies:   Allergies  Allergen Reactions   Latex Rash   Tetanus Toxoid Anaphylaxis    REACTION: anaphylaxis   Tramadol Other (See Comments)    Caused severe constipation---bowel obstruction   Alendronate Sodium     REACTION: passed out     Social History:  reports that she has quit smoking. She has never used smokeless tobacco. She reports that she does not drink alcohol and does not use drugs.   Family History: Family History  Problem Relation Age of Onset   Diabetes Mother    Cancer Father        jaw    Physical Exam: Vitals:   04/03/21 0040 04/03/21 0746 04/03/21 1555 04/03/21 1945  BP:  136/80 114/61   Pulse:  70 (!) 51   Resp:  18 16   Temp:  97.9 F (36.6 C) 98 F (36.7 C) 98.1 F (36.7 C)  TempSrc:  Oral Oral   SpO2: 92% 96% 98%   Weight:      Height:        Constitutional:  Alert and awake, oriented x3, not in any acute distress. Eyes: PERLA, EOMI, irises appear normal, anicteric sclera,  ENMT: external ears and  nose appear normal,            Lips appears normal, oropharynx mucosa, tongue, posterior pharynx appear normal  Neck: neck appears normal, no masses, normal ROM, no thyromegaly, no JVD  CVS: S1-S2 clear, irregularly irregular, bradycardic no murmur rubs or gallops, no LE edema, normal pedal pulses  Respiratory:  clear to auscultation bilaterally, no wheezing, rales or rhonchi. Respiratory effort normal. No accessory muscle use.  Abdomen: soft nontender, nondistended, normal bowel sounds, no hepatosplenomegaly, no hernias  Musculoskeletal: : no cyanosis, clubbing or edema noted bilaterallyNeuro: Cranial nerves II-XII intact, strength, sensation, reflexes Psych: judgement and insight  appear normal, stable mood and affect, mental status Skin: no rashes or lesions or ulcers, no induration or nodules   Data reviewed:  I have personally reviewed following labs and imaging studies Labs:  CBC: Recent Labs  Lab 04/02/21 1322 04/03/21 0434  WBC 9.2 6.7  HGB 14.2 12.9  HCT 42.5 39.7  MCV 79.9* 80.7  PLT 219 147*    Basic Metabolic Panel: Recent Labs  Lab 04/02/21 1322 04/03/21 0434  NA 137 141  K 4.9 4.5  CL 102 106  CO2 26 28  GLUCOSE 149* 114*  BUN 24* 25*  CREATININE 1.00 1.00  CALCIUM 9.5 9.1  MG  --  2.2  PHOS  --  3.9   GFR Estimated Creatinine Clearance: 26.3 mL/min (by C-G formula based on SCr of 1 mg/dL). Liver Function Tests: Recent Labs  Lab 04/02/21 1322  AST 27  ALT 9  ALKPHOS 92  BILITOT 0.9  PROT 7.3  ALBUMIN 4.0   Recent Labs  Lab 04/02/21 1322  LIPASE 37   No results for input(s): AMMONIA in the last 168 hours. Coagulation profile Recent Labs  Lab 04/02/21 1322  INR 1.6*    Cardiac Enzymes: No results for input(s): CKTOTAL, CKMB, CKMBINDEX, TROPONINI in the last 168 hours. BNP: Invalid input(s): POCBNP CBG: No results for input(s): GLUCAP in the last 168 hours. D-Dimer No results for input(s): DDIMER in the last 72 hours. Hgb A1c No results for input(s): HGBA1C in the last 72 hours. Lipid Profile No results for input(s): CHOL, HDL, LDLCALC, TRIG, CHOLHDL, LDLDIRECT in the last 72 hours. Thyroid function studies No results for input(s): TSH, T4TOTAL, T3FREE, THYROIDAB in the last 72 hours.  Invalid input(s): FREET3 Anemia work up No results for input(s): VITAMINB12, FOLATE, FERRITIN, TIBC, IRON, RETICCTPCT in the last 72 hours. Urinalysis    Component Value Date/Time   COLORURINE YELLOW (A) 04/02/2021 1322   APPEARANCEUR CLEAR (A) 04/02/2021 1322   LABSPEC >1.046 (H) 04/02/2021 1322   PHURINE 5.0 04/02/2021 1322   GLUCOSEU NEGATIVE 04/02/2021 1322   HGBUR NEGATIVE 04/02/2021 1322   BILIRUBINUR NEGATIVE  04/02/2021 1322   KETONESUR NEGATIVE 04/02/2021 1322   PROTEINUR NEGATIVE 04/02/2021 1322   NITRITE NEGATIVE 04/02/2021 1322   LEUKOCYTESUR NEGATIVE 04/02/2021 1322     Microbiology Recent Results (from the past 240 hour(s))  Resp Panel by RT-PCR (Flu A&B, Covid)     Status: None   Collection Time: 04/02/21  9:00 PM   Specimen: Nasopharyngeal(NP) swabs in vial transport medium  Result Value Ref Range Status   SARS Coronavirus 2 by RT PCR NEGATIVE NEGATIVE Final    Comment: (NOTE) SARS-CoV-2 target nucleic acids are NOT DETECTED.  The SARS-CoV-2 RNA is generally detectable in upper respiratory specimens during the acute phase of infection. The lowest concentration of SARS-CoV-2 viral copies this assay can detect is 138 copies/mL. A  negative result does not preclude SARS-Cov-2 infection and should not be used as the sole basis for treatment or other patient management decisions. A negative result may occur with  improper specimen collection/handling, submission of specimen other than nasopharyngeal swab, presence of viral mutation(s) within the areas targeted by this assay, and inadequate number of viral copies(<138 copies/mL). A negative result must be combined with clinical observations, patient history, and epidemiological information. The expected result is Negative.  Fact Sheet for Patients:  BloggerCourse.com  Fact Sheet for Healthcare Providers:  SeriousBroker.it  This test is no t yet approved or cleared by the [place] FDA and  has been authorized for detection and/or diagnosis of SARS-CoV-2 by FDA under an Emergency Use Authorization (EUA). This EUA will remain  in effect (meaning this test can be used) for the duration of the COVID-19 declaration under Section 564(b)(1) of the Act, 21 U.S.C.section 360bbb-3(b)(1), unless the authorization is terminated  or revoked sooner.       Influenza A by PCR NEGATIVE  NEGATIVE Final   Influenza B by PCR NEGATIVE NEGATIVE Final    Comment: (NOTE) The Xpert Xpress SARS-CoV-2/FLU/RSV plus assay is intended as an aid in the diagnosis of influenza from Nasopharyngeal swab specimens and should not be used as a sole basis for treatment. Nasal washings and aspirates are unacceptable for Xpert Xpress SARS-CoV-2/FLU/RSV testing.  Fact Sheet for Patients: BloggerCourse.com  Fact Sheet for Healthcare Providers: SeriousBroker.it  This test is not yet approved or cleared by the [place] FDA and has been authorized for detection and/or diagnosis of SARS-CoV-2 by FDA under an Emergency Use Authorization (EUA). This EUA will remain in effect (meaning this test can be used) for the duration of the COVID-19 declaration under Section 564(b)(1) of the Act, 21 U.S.C. section 360bbb-3(b)(1), unless the authorization is terminated or revoked.  Performed at [facility], [address] [place], [place] [address]        Inpatient Medications:   Scheduled Meds:  amLODipine  5 mg Oral Daily   docusate sodium  200 mg Oral Daily   fluticasone  1 spray Each Nare Daily   furosemide  20 mg Oral Once per day on Mon Wed Fri   lisinopril  40 mg Oral Daily   metoprolol succinate  12.5 mg Oral Daily   Continuous Infusions:  heparin 750 Units/hr (04/03/21 0630)     Radiological Exams on Admission: CT ABDOMEN PELVIS W CONTRAST  Result Date: 04/02/2021 CLINICAL DATA:  Bowel obstruction suspected EXAM: CT ABDOMEN AND PELVIS WITH CONTRAST TECHNIQUE: Multidetector CT imaging of the abdomen and pelvis was performed using the standard protocol following bolus administration of intravenous contrast. CONTRAST:  64mL OMNIPAQUE IOHEXOL 300 MG/ML  SOLN COMPARISON:  October 01, 2020 FINDINGS: Lower chest: Cardiomegaly.  Scattered bibasilar atelectasis. Hepatobiliary: No focal liver abnormality is seen. No gallstones,  gallbladder wall thickening, or biliary dilatation. Pancreas: Unremarkable. No pancreatic ductal dilatation or surrounding inflammatory changes. Spleen: Normal in size without focal abnormality. Adrenals/Urinary Tract: Adrenal glands are unremarkable. No hydronephrosis. Multiple parapelvic cysts. Bladder is unremarkable. Stomach/Bowel: There is a small-bowel obstruction with transition point in the mid lower abdomen near site of multiple surgical sutures (series 5, image 23; series 2, image 55). Small bowel measures approximately 3.4 cm. No evidence of bowel ischemia. Diverticulosis. Vascular/Lymphatic: Severe atherosclerotic calcifications. No new suspicious lymphadenopathy. Reproductive: Status post hysterectomy. Unchanged 12 mm LEFT adnexal cyst Other: No free air or free fluid. Musculoskeletal: New severe compression fracture deformity of T11.  Dextroscoliosis of the lumbar spine. IMPRESSION: 1. There is a small bowel obstruction with transition point in the lower mid abdomen just upstream of multiple surgical sutures. 2. New compression fracture deformity of T11. Aortic Atherosclerosis (ICD10-I70.0). Electronically Signed   By: Meda KlinefelterStephanie  Peacock MD   On: 04/02/2021 18:54   DG Abd Portable 1V-Small Bowel Obstruction Protocol-initial, 8 hr delay  Result Date: 04/03/2021 CLINICAL DATA:  Small bowel obstruction, 8 hour delay EXAM: PORTABLE ABDOMEN - 1 VIEW COMPARISON:  Yesterday FINDINGS: Enteric contrast opacifies nondilated colon. No dilated small bowel is seen superimposed. Enteric tube is in good position over the stomach. Contrast still present in the urinary bladder. Osteopenia and lumbar spine degeneration with dextroscoliosis. IMPRESSION: Enteric contrast has reached the nondilated colon and small bowel/stomach distention is improved from yesterday. Electronically Signed   By: Marnee SpringJonathon  Watts M.D.   On: 04/03/2021 10:45   DG Abd Portable 1V-Small Bowel Protocol-Position Verification  Result Date:  04/02/2021 CLINICAL DATA:  NG TUBE PLACEMENT HISTORY OF HTN EXAM: PORTABLE ABDOMEN - 1 VIEW COMPARISON:  X-ray abdomen 10/05/2020 FINDINGS: Enteric tube with tip and side port overlying the gastric lumen. The bowel gas pattern is normal. No radio-opaque calculi or other significant radiographic abnormality are seen. Visualized hemi thoraces unremarkable.  Aortic calcification. IMPRESSION: Enteric tube in good position. Electronically Signed   By: Tish FredericksonMorgane  Naveau M.D.   On: 04/02/2021 23:00    Impression/Recommendations Consult for asymptomatic 2-second sinus pause and bradycardia to 40s to 6050s and 87 year old female with a history of permanent A. fib admitted to the surgical service for SBO  Sinus pause Atrial fibrillation with slow ventricular response (HCC) - Hold Toprol - Cardiology consult for additional recommendations - Continue heparin infusion to convert back to Xarelto when appropriate per surgery regarding oral intake  SBO and history of ischemic bowel - Continued management per surgery   Essential hypertension - Continue lisinopril  Subacute T12 fracture(incidental finding on admission) - TLSO brace per prior neurosurgical recommendation   Thank you for this consultation.  Our [facility] hospital[facility] team will follow the patient with you.   Time Spent: 5855  Andris BaumannHazel V Zabrina Brotherton M.D. [facility] 04/03/2021, 9:48 PM

## 2021-04-04 DIAGNOSIS — I4891 Unspecified atrial fibrillation: Secondary | ICD-10-CM

## 2021-04-04 DIAGNOSIS — R001 Bradycardia, unspecified: Secondary | ICD-10-CM

## 2021-04-04 DIAGNOSIS — I10 Essential (primary) hypertension: Secondary | ICD-10-CM

## 2021-04-04 LAB — HEPARIN LEVEL (UNFRACTIONATED): Heparin Unfractionated: 0.95 IU/mL — ABNORMAL HIGH (ref 0.30–0.70)

## 2021-04-04 LAB — BASIC METABOLIC PANEL
Anion gap: 5 (ref 5–15)
BUN: 21 mg/dL (ref 8–23)
CO2: 30 mmol/L (ref 22–32)
Calcium: 8.8 mg/dL — ABNORMAL LOW (ref 8.9–10.3)
Chloride: 103 mmol/L (ref 98–111)
Creatinine, Ser: 0.94 mg/dL (ref 0.44–1.00)
GFR, Estimated: 56 mL/min — ABNORMAL LOW (ref >60)
Glucose, Bld: 87 mg/dL (ref 70–99)
Potassium: 4.2 mmol/L (ref 3.5–5.1)
Sodium: 138 mmol/L (ref 135–145)

## 2021-04-04 LAB — CBC
HCT: 35.8 % — ABNORMAL LOW (ref 36.0–46.0)
Hemoglobin: 11.4 g/dL — ABNORMAL LOW (ref 12.0–15.0)
MCH: 25.8 pg — ABNORMAL LOW (ref 26.0–34.0)
MCHC: 31.8 g/dL (ref 30.0–36.0)
MCV: 81 fL (ref 80.0–100.0)
Platelets: 167 10*3/uL (ref 150–400)
RBC: 4.42 MIL/uL (ref 3.87–5.11)
RDW: 16.7 % — ABNORMAL HIGH (ref 11.5–15.5)
WBC: 6.3 10*3/uL (ref 4.0–10.5)
nRBC: 0 % (ref 0.0–0.2)

## 2021-04-04 LAB — APTT: aPTT: 157 seconds — ABNORMAL HIGH (ref 24–36)

## 2021-04-04 LAB — MAGNESIUM: Magnesium: 2.2 mg/dL (ref 1.7–2.4)

## 2021-04-04 LAB — PHOSPHORUS: Phosphorus: 3.6 mg/dL (ref 2.5–4.6)

## 2021-04-04 MED ORDER — HEPARIN (PORCINE) 25000 UT/250ML-% IV SOLN
800.0000 [IU]/h | INTRAVENOUS | Status: DC
  Administered 2021-04-04: 800 [IU]/h via INTRAVENOUS

## 2021-04-04 MED ORDER — RIVAROXABAN 15 MG PO TABS
15.0000 mg | ORAL_TABLET | Freq: Every day | ORAL | Status: DC
  Administered 2021-04-04 – 2021-04-06 (×3): 15 mg via ORAL
  Filled 2021-04-04 (×3): qty 1

## 2021-04-04 NOTE — Progress Notes (Signed)
ANTICOAGULATION CONSULT NOTE  Pharmacy Consult for Heparin Indication: atrial fibrillation  Allergies  Allergen Reactions   Latex Rash   Tetanus Toxoid Anaphylaxis    REACTION: anaphylaxis   Tramadol Other (See Comments)    Caused severe constipation---bowel obstruction   Alendronate Sodium     REACTION: passed out    Patient Measurements: Height: 4\' 11"  (149.9 cm) Weight: 56.2 kg (123 lb 14.4 oz) IBW/kg (Calculated) : 43.2 Heparin Dosing Weight: 55.77 kg  Vital Signs: Temp: 97.6 F (36.4 C) (07/24 0453) Temp Source: Oral (07/24 0453) BP: 120/48 (07/24 0453) Pulse Rate: 52 (07/24 0453)  Labs: Recent Labs    04/02/21 1322 04/03/21 0434 04/03/21 1414 04/03/21 2154 04/04/21 0427  HGB 14.2 12.9  --   --  11.4*  HCT 42.5 39.7  --   --  35.8*  PLT 219 147*  --   --  167  APTT 32 112* 78* 43* 157*  LABPROT 19.4*  --   --   --   --   INR 1.6*  --   --   --   --   HEPARINUNFRC  --  >1.10*  --   --  0.95*  CREATININE 1.00 1.00  --   --  0.94     Estimated Creatinine Clearance: 28 mL/min (by C-G formula based on SCr of 0.94 mg/dL).   Medical History: Past Medical History:  Diagnosis Date   Hypertension 12/25/2013   Ischemic bowel disease (HCC)    a. 03/2017 abd pain-->internal herina and ischemic jejunum s/p ex lap and small bowel resection.   Macular degeneration of left eye 01/06/2015   Osteoporosis    Permanent atrial fibrillation (HCC)    a. First noted 03/2017;  b. 07/2017 Echo: EF 60-65%, no rwma, mild MR, mildly to mod dil LA; c. 07/2017 48hr Holter: persistent Afib, avg HR of 66 (range 39-140 bpm);  d. CHA2DS2VASc = 4-->Xarelto initiated 07/2017.    Medications:  Rivaroxaban 15 mg daily with supper - last dose (per daughter) was 7/21 around dinner time  Assessment: 87 yo female with medical history for HTN, afib (on Xarelto), and osteoporosis. Pt seen by surgery who noted small SBO on CT abdomen. Pharmacy has been consulted for heparin dosing and monitoring  in case patient may need surgery.   Baseline aPTT and PT-INR added on. H/H and Plt WNL  7/23 1414 aPTT 78   Goal of Therapy:  Heparin level 0.3-0.7 units/ml - once correlating with aPTT aPTT 66-102 seconds Monitor platelets by anticoagulation protocol: Yes   Plan:  7/23: APTT @ 2154 = 43 Will order heparin 1600 units IV X 1 bolus and increase drip rate to 950 units/hr. Will recheck aPTT 8 hrs after rate change.   7/24 @ 0427:   APTT = 157,  HL = 0.95 Will hold heparin drip for 1 hr and restart @ 800 units/hr.  Will recheck aPTT 8 hrs after rate change.   Benjerman Molinelli D, PharmD 04/04/2021,7:09 AM

## 2021-04-04 NOTE — Progress Notes (Signed)
Subjective:  CC: Paula Luna is a 87 y.o. female  Hospital stay day 1,   SBO  HPI: No acute issues reported overnight. NG-tube placed and pending small bowel follow-through study  ROS:  General: Denies weight loss, weight gain, fatigue, fevers, chills, and night sweats. Heart: Denies chest pain, palpitations, racing heart, irregular heartbeat, leg pain or swelling, and decreased activity tolerance. Respiratory: Denies breathing difficulty, shortness of breath, wheezing, cough, and sputum. GI: Denies change in appetite, heartburn, nausea, vomiting, constipation, diarrhea, and blood in stool. GU: Denies difficulty urinating, pain with urinating, urgency, frequency, blood in urine.   Objective:   Temp:  [97.6 F (36.4 C)-98.1 F (36.7 C)] 97.6 F (36.4 C) (07/24 0453) Pulse Rate:  [51-52] 52 (07/24 0453) Resp:  [16-18] 18 (07/24 0453) BP: (114-120)/(48-61) 120/48 (07/24 0453) SpO2:  [98 %] 98 % (07/24 0453)     Height: 4\' 11"  (149.9 cm) Weight: 56.2 kg BMI (Calculated): 25.01   Intake/Output this shift:   Intake/Output Summary (Last 24 hours) at 04/04/2021 1246 Last data filed at 04/04/2021 1031 Gross per 24 hour  Intake 720 ml  Output --  Net 720 ml    Constitutional :  alert, cooperative, appears stated age, and no distress  Respiratory:  clear to auscultation bilaterally  Cardiovascular:  regular rate and rhythm  Gastrointestinal: soft, non-tender; bowel sounds normal; no masses,  no organomegaly.   Skin: Cool and moist.   Psychiatric: Normal affect, non-agitated, not confused       LABS:  CMP Latest Ref Rng & Units 04/04/2021 04/03/2021 04/02/2021  Glucose 70 - 99 mg/dL 87 04/04/2021) 272(Z)  BUN 8 - 23 mg/dL 21 366(Y) 40(H)  Creatinine 0.44 - 1.00 mg/dL 47(Q 2.59 5.63  Sodium 135 - 145 mmol/L 138 141 137  Potassium 3.5 - 5.1 mmol/L 4.2 4.5 4.9  Chloride 98 - 111 mmol/L 103 106 102  CO2 22 - 32 mmol/L 30 28 26   Calcium 8.9 - 10.3 mg/dL 8.75) 9.1 9.5  Total Protein  6.5 - 8.1 g/dL - - 7.3  Total Bilirubin 0.3 - 1.2 mg/dL - - 0.9  Alkaline Phos 38 - 126 U/L - - 92  AST 15 - 41 U/L - - 27  ALT 0 - 44 U/L - - 9   CBC Latest Ref Rng & Units 04/04/2021 04/03/2021 04/02/2021  WBC 4.0 - 10.5 K/uL 6.3 6.7 9.2  Hemoglobin 12.0 - 15.0 g/dL 11.4(L) 12.9 14.2  Hematocrit 36.0 - 46.0 % 35.8(L) 39.7 42.5  Platelets 150 - 400 K/uL 167 147(L) 219    RADS: N/a Assessment:   SBO.  Awaiting small bowel follow-through study.  Continue current management and monitor closely for now.

## 2021-04-04 NOTE — Consult Note (Signed)
Cardiology Consultation:   Patient ID: Paula Luna MRN: [id number]; DOB: 1925-01-29  Admit date: 04/02/2021 Date of Consult: 04/04/2021  PCP:  Hannah Beat, MD   [facility] Providers Cardiologist:  Yvonne Kendall, MD   { Patient Profile:   Paula Luna is a 87 y.o. female with a hx of atrial fibrillation  who is being seen 04/04/2021 for the evaluation of Dr Tonna Boehringer at the request of bradycardia, pauses .  History of Present Illness:   Paula Luna is a 87 yo with permanent atrial fib (on Xarelto at home ; heparin here)  She is followd by [facility] in clinic    Also hx of HTN.  Last seen in clinic in APril 2022 by PA  [facility] is admitted for SBO on surgiccal service   Plan for conservitie Rx   Called for SB and pauses of 2 seconds      Past Medical History:  Diagnosis Date   Hypertension 12/25/2013   Ischemic bowel disease (HCC)    a. 03/2017 abd pain-->internal herina and ischemic jejunum s/p ex lap and small bowel resection.   Macular degeneration of left eye 01/06/2015   Osteoporosis    Permanent atrial fibrillation (HCC)    a. First noted 03/2017;  b. 07/2017 Echo: EF 60-65%, no rwma, mild MR, mildly to mod dil LA; c. 07/2017 48hr Holter: persistent Afib, avg HR of 66 (range 39-140 bpm);  d. CHA2DS2VASc = 4-->Xarelto initiated 07/2017.    Past Surgical History:  Procedure Laterality Date   CLOSED REDUCTION PROXIMAL HUMERUS FRACTURE Left 2017   LAPAROSCOPIC SMALL BOWEL RESECTION     LAPAROTOMY N/A 03/18/2017   Procedure: EXPLORATORY LAPAROTOMY, SMALL BOWEL RESECTION;  Surgeon: Leafy Ro, MD;  Location: [facility] ORS;  Service: General;  Laterality: N/A;   TOTAL ABDOMINAL HYSTERECTOMY         Inpatient Medications: Scheduled Meds:  amLODipine  5 mg Oral Daily   docusate sodium  200 mg Oral Daily   fluticasone  1 spray Each Nare Daily   furosemide  20 mg Oral Once per day on Mon Wed Fri   lisinopril  40 mg Oral Daily   Rivaroxaban  15 mg Oral Q supper   Continuous  Infusions:  PRN Meds: docusate sodium, HYDROcodone-acetaminophen, menthol-cetylpyridinium, morphine injection, ondansetron **OR** ondansetron (ZOFRAN) IV  Allergies:    Allergies  Allergen Reactions   Latex Rash   Tetanus Toxoid Anaphylaxis    REACTION: anaphylaxis   Tramadol Other (See Comments)    Caused severe constipation---bowel obstruction   Alendronate Sodium     REACTION: passed out    Social History:   Social History   Socioeconomic History   Marital status: Widowed    Spouse name: Not on file   Number of children: Not on file   Years of education: Not on file   Highest education level: Not on file  Occupational History   Occupation: Musician: [email or web address]    Comment: 3 days a week  Tobacco Use   Smoking status: Former   Smokeless tobacco: Never  Vaping Use   Vaping Use: Never used  Substance and Sexual Activity   Alcohol use: No   Drug use: No   Sexual activity: Not Currently  Other Topics Concern   Not on file  Social History Narrative   Widowed after 64 years   Working some at [employer] in [employer] with daughter   Social Determinants of  Health   Financial Resource Strain: Not on file  Food Insecurity: Not on file  Transportation Needs: Not on file  Physical Activity: Not on file  Stress: Not on file  Social Connections: Not on file  Intimate Partner Violence: Not on file    Family History:    Family History  Problem Relation Age of Onset   Diabetes Mother    Cancer Father        jaw     ROS:  Please see the history of present illness.   All other ROS reviewed and negative.     Physical Exam/Data:   Vitals:   04/03/21 0746 04/03/21 1555 04/03/21 1945 04/04/21 0453  BP: 136/80 114/61  (!) 120/48  Pulse: 70 (!) 51  (!) 52  Resp: 18 16  18   Temp: 97.9 F (36.6 C) 98 F (36.7 C) 98.1 F (36.7 C) 97.6 F (36.4 C)  TempSrc: Oral Oral  Oral  SpO2: 96% 98%  98%  Weight:      Height:         Intake/Output Summary (Last 24 hours) at 04/04/2021 1151 Last data filed at 04/04/2021 1031 Gross per 24 hour  Intake 720 ml  Output --  Net 720 ml   Last 3 Weights 04/03/2021 04/02/2021 12/30/2020  Weight (lbs) 123 lb 14.4 oz 132 lb 120 lb  Weight (kg) 56.2 kg 59.875 kg 54.432 kg     Body mass index is 25.02 kg/m.  GeneralThin 87 yo  in no acute distress HEENT: normal Lymph: no adenopathy Neck: no JVD Endocrine:  No thryomegaly Vascular: No carotid bruits; FA pulses 2+ bilaterally without bruits  Cardiac:  Irreg rate and rhythm  no murmur  Lungs:  clear to auscultation bilaterally, Abd: soft, nontender, no hepatomegaly   + BS  Ext: no edema Musculoskeletal:  No deformities,  Skin: warm and dry  Neuro:  CNs 2-12 intact, no focal abnormalities noted Psych:  Normal affect   EKG:  The EKG was personally reviewed and demonstrates:  Atrial fibrillation   50 bpm   Septal MI    Telemetry:  Telemetry was personally reviewed and demonstrates:  Afib  rates 40s -60  Longes paus 2.9 sec    Relevant CV Studies: Echo 07/2017 Left ventricle: The cavity size was normal. There was mild    concentric hypertrophy. Systolic function was normal. The    estimated ejection fraction was in the range of 60% to 65%. Wall    motion was normal; there were no regional wall motion    abnormalities. The study was not technically sufficient to allow    evaluation of LV diastolic dysfunction due to atrial    fibrillation.  - Mitral valve: There was mild regurgitation.  - Left atrium: The atrium was mildly to moderately dilated.   Holter monitor 06/2017 The patient was monitored for 48 hours. The predominant rhythm was atrial fibrillation (100% burden) with an average rate of 66 bpm (range 39-140 bpm). The longest R-R interval wass 2.5 seconds. There were occasional wide-complex beats that could represent PVCs and/or aberrancy. No sustained ventricular arrhythmias were identified.   Atrial  fibrillation with variable heart rate response. Occasional PVCs versus aberrancy.  Laboratory Data:  High Sensitivity Troponin:  No results for input(s): TROPONINIHS in the last 720 hours.   Chemistry Recent Labs  Lab 04/02/21 1322 04/03/21 0434 04/04/21 0427  NA 137 141 138  K 4.9 4.5 4.2  CL 102 106 103  CO2 26 28 30  GLUCOSE 149* 114* 87  BUN 24* 25* 21  CREATININE 1.00 1.00 0.94  CALCIUM 9.5 9.1 8.8*  GFRNONAA 52* 52* 56*  ANIONGAP 9 7 5     Recent Labs  Lab 04/02/21 1322  PROT 7.3  ALBUMIN 4.0  AST 27  ALT 9  ALKPHOS 92  BILITOT 0.9   Hematology Recent Labs  Lab 04/02/21 1322 04/03/21 0434 04/04/21 0427  WBC 9.2 6.7 6.3  RBC 5.32* 4.92 4.42  HGB 14.2 12.9 11.4*  HCT 42.5 39.7 35.8*  MCV 79.9* 80.7 81.0  MCH 26.7 26.2 25.8*  MCHC 33.4 32.5 31.8  RDW 16.3* 16.4* 16.7*  PLT 219 147* 167   BNPNo results for input(s): BNP, PROBNP in the last 168 hours.  DDimer No results for input(s): DDIMER in the last 168 hours.   Radiology/Studies:  CT ABDOMEN PELVIS W CONTRAST  Result Date: 04/02/2021 CLINICAL DATA:  Bowel obstruction suspected EXAM: CT ABDOMEN AND PELVIS WITH CONTRAST TECHNIQUE: Multidetector CT imaging of the abdomen and pelvis was performed using the standard protocol following bolus administration of intravenous contrast. CONTRAST:  75mL OMNIPAQUE IOHEXOL 300 MG/ML  SOLN COMPARISON:  October 01, 2020 FINDINGS: Lower chest: Cardiomegaly.  Scattered bibasilar atelectasis. Hepatobiliary: No focal liver abnormality is seen. No gallstones, gallbladder wall thickening, or biliary dilatation. Pancreas: Unremarkable. No pancreatic ductal dilatation or surrounding inflammatory changes. Spleen: Normal in size without focal abnormality. Adrenals/Urinary Tract: Adrenal glands are unremarkable. No hydronephrosis. Multiple parapelvic cysts. Bladder is unremarkable. Stomach/Bowel: There is a small-bowel obstruction with transition point in the mid lower abdomen near  site of multiple surgical sutures (series 5, image 23; series 2, image 55). Small bowel measures approximately 3.4 cm. No evidence of bowel ischemia. Diverticulosis. Vascular/Lymphatic: Severe atherosclerotic calcifications. No new suspicious lymphadenopathy. Reproductive: Status post hysterectomy. Unchanged 12 mm LEFT adnexal cyst Other: No free air or free fluid. Musculoskeletal: New severe compression fracture deformity of T11. Dextroscoliosis of the lumbar spine. IMPRESSION: 1. There is a small bowel obstruction with transition point in the lower mid abdomen just upstream of multiple surgical sutures. 2. New compression fracture deformity of T11. Aortic Atherosclerosis (ICD10-I70.0). Electronically Signed   By: Meda KlinefelterStephanie  Peacock MD   On: 04/02/2021 18:54   DG Abd Portable 1V-Small Bowel Obstruction Protocol-initial, 8 hr delay  Result Date: 04/03/2021 CLINICAL DATA:  Small bowel obstruction, 8 hour delay EXAM: PORTABLE ABDOMEN - 1 VIEW COMPARISON:  Yesterday FINDINGS: Enteric contrast opacifies nondilated colon. No dilated small bowel is seen superimposed. Enteric tube is in good position over the stomach. Contrast still present in the urinary bladder. Osteopenia and lumbar spine degeneration with dextroscoliosis. IMPRESSION: Enteric contrast has reached the nondilated colon and small bowel/stomach distention is improved from yesterday. Electronically Signed   By: Marnee SpringJonathon  Watts M.D.   On: 04/03/2021 10:45   DG Abd Portable 1V-Small Bowel Protocol-Position Verification  Result Date: 04/02/2021 CLINICAL DATA:  NG TUBE PLACEMENT HISTORY OF HTN EXAM: PORTABLE ABDOMEN - 1 VIEW COMPARISON:  X-ray abdomen 10/05/2020 FINDINGS: Enteric tube with tip and side port overlying the gastric lumen. The bowel gas pattern is normal. No radio-opaque calculi or other significant radiographic abnormality are seen. Visualized hemi thoraces unremarkable.  Aortic calcification. IMPRESSION: Enteric tube in good position.  Electronically Signed   By: Tish FredericksonMorgane  Naveau M.D.   On: 04/02/2021 23:00     Assessment and Plan:   Bradycardia  Patient has had low heart rates during admit  BP has been OK  NO significant pauses   Overall appears  to be tolerating with no hemodynamic compromise     Follow      2  Atrial fibrllation  Hx permanent afib   Follow    On heparin now while in hosp   Will resume Xarelto when taking PO well  3  HTN  BP is controlled     4  GI   Hx SBO  Followed by surgery  5  T12 fx   Incidental findings   For questions or updates, please contact [facility] Please consult [email or web address] for contact info under    Signed, Dietrich Pates, MD  04/04/2021 11:51 AM

## 2021-04-04 NOTE — Telephone Encounter (Signed)
Admitted with SBO.

## 2021-04-04 NOTE — Progress Notes (Signed)
Subjective:  CC: Paula Luna is a 87 y.o. female  Hospital stay day 2,   SBO  HPI: No acute issues overnight.  Started passing flatus and tolerated clamp trial yesterday so was advanced to clear liquid diet.  Patient has no complaints this morning  ROS:  General: Denies weight loss, weight gain, fatigue, fevers, chills, and night sweats. Heart: Denies chest pain, palpitations, racing heart, irregular heartbeat, leg pain or swelling, and decreased activity tolerance. Respiratory: Denies breathing difficulty, shortness of breath, wheezing, cough, and sputum. GI: Denies change in appetite, heartburn, nausea, vomiting, constipation, diarrhea, and blood in stool. GU: Denies difficulty urinating, pain with urinating, urgency, frequency, blood in urine.   Objective:   Temp:  [97.6 F (36.4 C)-98.1 F (36.7 C)] 97.6 F (36.4 C) (07/24 0453) Pulse Rate:  [51-52] 52 (07/24 0453) Resp:  [16-18] 18 (07/24 0453) BP: (114-120)/(48-61) 120/48 (07/24 0453) SpO2:  [98 %] 98 % (07/24 0453)     Height: 4\' 11"  (149.9 cm) Weight: 56.2 kg BMI (Calculated): 25.01   Intake/Output this shift:   Intake/Output Summary (Last 24 hours) at 04/04/2021 1244 Last data filed at 04/04/2021 1031 Gross per 24 hour  Intake 720 ml  Output --  Net 720 ml    Constitutional :  alert, cooperative, appears stated age, and no distress  Respiratory:  clear to auscultation bilaterally  Cardiovascular:  regular rate and rhythm  Gastrointestinal: soft, non-tender; bowel sounds normal; no masses,  no organomegaly.   Skin: Cool and moist.   Psychiatric: Normal affect, non-agitated, not confused       LABS:  CMP Latest Ref Rng & Units 04/04/2021 04/03/2021 04/02/2021  Glucose 70 - 99 mg/dL 87 04/04/2021) 932(I)  BUN 8 - 23 mg/dL 21 712(W) 58(K)  Creatinine 0.44 - 1.00 mg/dL 99(I 3.38 2.50  Sodium 135 - 145 mmol/L 138 141 137  Potassium 3.5 - 5.1 mmol/L 4.2 4.5 4.9  Chloride 98 - 111 mmol/L 103 106 102  CO2 22 - 32 mmol/L  30 28 26   Calcium 8.9 - 10.3 mg/dL 5.39) 9.1 9.5  Total Protein 6.5 - 8.1 g/dL - - 7.3  Total Bilirubin 0.3 - 1.2 mg/dL - - 0.9  Alkaline Phos 38 - 126 U/L - - 92  AST 15 - 41 U/L - - 27  ALT 0 - 44 U/L - - 9   CBC Latest Ref Rng & Units 04/04/2021 04/03/2021 04/02/2021  WBC 4.0 - 10.5 K/uL 6.3 6.7 9.2  Hemoglobin 12.0 - 15.0 g/dL 11.4(L) 12.9 14.2  Hematocrit 36.0 - 46.0 % 35.8(L) 39.7 42.5  Platelets 150 - 400 K/uL 167 147(L) 219    RADS: N/a Assessment:   SBO.  Improving, tolerating clears.  Will advance to full liquid diet as she continues to pass flatus.  Appreciate hospitalist consult and radiology recommendations regarding her cardiac status.

## 2021-04-04 NOTE — Progress Notes (Addendum)
Chaplain responded to a spiritual care consult, pt requests prayer.  Chaplain facilitated storytelling and provided positive emotional regard and active listening.  Pt shared many stories about her young life, married life and several generations in her family.  Her daughter and grandson arrived.  Per daughter, pt seems better than at their last visit.  Per pt request, Chaplain provided prayer. Pt also expressed interest in a visit from her local parish, [address]  Chaplain will call to make that request.    Please contact for ongoing support as needed or requested.   Belia Heman, Chaplain  Pager: [phone]  Update: chaplain did contact pt's local parish [facility] [place]) with the request for a non-emergency visit.   04/04/21 1400  Clinical Encounter Type  Visited With Patient and family together  Visit Type Initial;Spiritual support;Social support  Referral From Patient  Consult/Referral To Chaplain  Spiritual Encounters  Spiritual Needs Prayer;Ritual  Stress Factors  Patient Stress Factors Health changes

## 2021-04-05 LAB — CBC
HCT: 36.7 % (ref 36.0–46.0)
Hemoglobin: 11.7 g/dL — ABNORMAL LOW (ref 12.0–15.0)
MCH: 25.4 pg — ABNORMAL LOW (ref 26.0–34.0)
MCHC: 31.9 g/dL (ref 30.0–36.0)
MCV: 79.6 fL — ABNORMAL LOW (ref 80.0–100.0)
Platelets: 171 10*3/uL (ref 150–400)
RBC: 4.61 MIL/uL (ref 3.87–5.11)
RDW: 16.4 % — ABNORMAL HIGH (ref 11.5–15.5)
WBC: 5.2 10*3/uL (ref 4.0–10.5)
nRBC: 0 % (ref 0.0–0.2)

## 2021-04-05 LAB — BASIC METABOLIC PANEL
Anion gap: 5 (ref 5–15)
BUN: 16 mg/dL (ref 8–23)
CO2: 26 mmol/L (ref 22–32)
Calcium: 8.6 mg/dL — ABNORMAL LOW (ref 8.9–10.3)
Chloride: 105 mmol/L (ref 98–111)
Creatinine, Ser: 0.92 mg/dL (ref 0.44–1.00)
GFR, Estimated: 58 mL/min — ABNORMAL LOW (ref >60)
Glucose, Bld: 109 mg/dL — ABNORMAL HIGH (ref 70–99)
Potassium: 4.1 mmol/L (ref 3.5–5.1)
Sodium: 136 mmol/L (ref 135–145)

## 2021-04-05 LAB — MAGNESIUM: Magnesium: 2.1 mg/dL (ref 1.7–2.4)

## 2021-04-05 LAB — PHOSPHORUS: Phosphorus: 3.3 mg/dL (ref 2.5–4.6)

## 2021-04-05 MED ORDER — ACETAMINOPHEN 325 MG PO TABS: 650.0000 mg | ORAL_TABLET | Freq: Four times a day (QID) | ORAL | Status: DC | PRN

## 2021-04-05 NOTE — Progress Notes (Signed)
Progress Note  Patient Name: Paula Luna Date of Encounter: 04/05/2021  Primary Cardiologist: End  Subjective   No chest pain, dyspnea, palpitations, dizziness, presyncope, or syncope. Has had flatus and BM. Hungry this morning.   Inpatient Medications    Scheduled Meds:  amLODipine  5 mg Oral Daily   docusate sodium  200 mg Oral Daily   fluticasone  1 spray Each Nare Daily   furosemide  20 mg Oral Once per day on Mon Wed Fri   lisinopril  40 mg Oral Daily   Rivaroxaban  15 mg Oral Q supper   Continuous Infusions:  PRN Meds: docusate sodium, HYDROcodone-acetaminophen, menthol-cetylpyridinium, morphine injection, ondansetron **OR** ondansetron (ZOFRAN) IV   Vital Signs    Vitals:   04/04/21 1938 04/05/21 0435 04/05/21 0436 04/05/21 0757  BP: (!) 107/50 (!) 100/52 (!) 100/53 (!) 152/55  Pulse: (!) 59 (!) 46 (!) 51 (!) 58  Resp: 15 16  18   Temp: 97.9 F (36.6 C) 97.8 F (36.6 C)  97.6 F (36.4 C)  TempSrc: Oral Oral  Oral  SpO2: 99% 95% 100% 96%  Weight:      Height:        Intake/Output Summary (Last 24 hours) at 04/05/2021 0858 Last data filed at 04/04/2021 1900 Gross per 24 hour  Intake 720 ml  Output 500 ml  Net 220 ml   Filed Weights   04/02/21 2044 04/03/21 0000  Weight: 59.9 kg 56.2 kg    Telemetry    Afib, upper 40s to 60s bpm, longest R-R interval 2 seconds - Personally Reviewed  ECG    No new tracings - Personally Reviewed  Physical Exam   GEN: No acute distress. Elderly appearing.  Neck: No JVD. Cardiac: Mildly bradycardic, IRIR, I/VI systolic murmur LSB, no rubs, or gallops.  Respiratory: Clear to auscultation bilaterally.  GI: Soft, nontender, non-distended, + bowel sounds.  MS: No edema; No deformity. Neuro:  Alert and oriented x 3; Nonfocal.  Psych: Normal affect.  Labs    Chemistry Recent Labs  Lab 04/02/21 1322 04/03/21 0434 04/04/21 0427 04/05/21 0441  NA 137 141 138 136  K 4.9 4.5 4.2 4.1  CL 102 106 103 105   CO2 26 28 30 26   GLUCOSE 149* 114* 87 109*  BUN 24* 25* 21 16  CREATININE 1.00 1.00 0.94 0.92  CALCIUM 9.5 9.1 8.8* 8.6*  PROT 7.3  --   --   --   ALBUMIN 4.0  --   --   --   AST 27  --   --   --   ALT 9  --   --   --   ALKPHOS 92  --   --   --   BILITOT 0.9  --   --   --   GFRNONAA 52* 52* 56* 58*  ANIONGAP 9 7 5 5      Hematology Recent Labs  Lab 04/03/21 0434 04/04/21 0427 04/05/21 0441  WBC 6.7 6.3 5.2  RBC 4.92 4.42 4.61  HGB 12.9 11.4* 11.7*  HCT 39.7 35.8* 36.7  MCV 80.7 81.0 79.6*  MCH 26.2 25.8* 25.4*  MCHC 32.5 31.8 31.9  RDW 16.4* 16.7* 16.4*  PLT 147* 167 171    Cardiac EnzymesNo results for input(s): TROPONINI in the last 168 hours. No results for input(s): TROPIPOC in the last 168 hours.   BNPNo results for input(s): BNP, PROBNP in the last 168 hours.   DDimer No results for input(s):  DDIMER in the last 168 hours.   Radiology    DG Abd Portable 1V-Small Bowel Obstruction Protocol-initial, 8 hr delay  Result Date: 04/03/2021 IMPRESSION: Enteric contrast has reached the nondilated colon and small bowel/stomach distention is improved from yesterday. Electronically Signed   By: Marnee Spring M.D.   On: 04/03/2021 10:45    Cardiac Studies   2D echo 07/2017: - Left ventricle: The cavity size was normal. There was mild    concentric hypertrophy. Systolic function was normal. The    estimated ejection fraction was in the range of 60% to 65%. Wall    motion was normal; there were no regional wall motion    abnormalities. The study was not technically sufficient to allow    evaluation of LV diastolic dysfunction due to atrial    fibrillation.  - Mitral valve: There was mild regurgitation.  - Left atrium: The atrium was mildly to moderately dilated.  __________  48-hour Holter monitor 07/2017: The patient was monitored for 48 hours. The predominant rhythm was atrial fibrillation (100% burden) with an average rate of 66 bpm (range 39-140 bpm). The  longest R-R interval wass 2.5 seconds. There were occasional wide-complex beats that could represent PVCs and/or aberrancy. No sustained ventricular arrhythmias were identified.   Atrial fibrillation with variable heart rate response. Occasional PVCs versus aberrancy.  Patient Profile     87 y.o. female with history of Permanent Afib on Xarelto, HTN, ischemic bowel s/p resection, osteoporosis, and anemia who was admitted with SBO and is being conservatively managed who we are seeing for sinus bradycardia and sinus pauses.   Assessment & Plan    1. Bradycardia/pause: -Noted history of asymptomatic Afib with bradycardic rates leading to the tapering of her Toprol XL to 12.5 mg at her visit in 12/2020 -Ventricular rates this admission stable in the upper 40s in the very early morning hours to the 60s bpm -Longest R-R interval 2 seconds -Continue to hold Toprol XL for now -No indication for transcutaneous pacing, temp wire, emergent pace maker -BP stable, no indication for dopamine or atropine at this time  2. Permanent Afib: -She remains in the Afib with known bradycardic rates -CHADS2VASc at least 4 (HTN, age x 2, sex category) -She has been restarted on PTA Xarelto  -PTA Toprol held  3. HTN: -Blood pressure stable to mildly elevated -PTA amlodipine, lisinopril, and Lasix  4. SBO: -Conservatively managed with patient passing gas and having had a BM last evening   For questions or updates, please contact [facility] Please consult [email or web address] for contact info under Cardiology/STEMI.    Signed, Eula Listen, PA-C [facility] Pager: [phone] 04/05/2021, 8:58 AM

## 2021-04-05 NOTE — Progress Notes (Signed)
Subjective:  CC: Paula Luna is a 87 y.o. female  Hospital stay day 3,   SBO  HPI: No acute issues overnight.  Tolerating diet, small BM reported  ROS:  General: Denies weight loss, weight gain, fatigue, fevers, chills, and night sweats. Heart: Denies chest pain, palpitations, racing heart, irregular heartbeat, leg pain or swelling, and decreased activity tolerance. Respiratory: Denies breathing difficulty, shortness of breath, wheezing, cough, and sputum. GI: Denies change in appetite, heartburn, nausea, vomiting, constipation, diarrhea, and blood in stool. GU: Denies difficulty urinating, pain with urinating, urgency, frequency, blood in urine.   Objective:   Temp:  [97.6 F (36.4 C)-98.2 F (36.8 C)] 98.2 F (36.8 C) (07/25 1622) Pulse Rate:  [46-67] 67 (07/25 1622) Resp:  [15-19] 19 (07/25 1622) BP: (100-152)/(50-65) 133/65 (07/25 1622) SpO2:  [95 %-100 %] 97 % (07/25 1622)     Height: 4\' 11"  (149.9 cm) Weight: 56.2 kg BMI (Calculated): 25.01   Intake/Output this shift:   Intake/Output Summary (Last 24 hours) at 04/05/2021 1832 Last data filed at 04/05/2021 1017 Gross per 24 hour  Intake 480 ml  Output 500 ml  Net -20 ml    Constitutional :  alert, cooperative, appears stated age, and no distress  Respiratory:  clear to auscultation bilaterally  Cardiovascular:  regular rate and rhythm  Gastrointestinal: soft, non-tender; bowel sounds normal; no masses,  no organomegaly.   Skin: Cool and moist.   Psychiatric: Normal affect, non-agitated, not confused       LABS:  CMP Latest Ref Rng & Units 04/05/2021 04/04/2021 04/03/2021  Glucose 70 - 99 mg/dL 04/05/2021) 87 962(X)  BUN 8 - 23 mg/dL 16 21 528(U)  Creatinine 0.44 - 1.00 mg/dL 13(K 4.40 1.02  Sodium 135 - 145 mmol/L 136 138 141  Potassium 3.5 - 5.1 mmol/L 4.1 4.2 4.5  Chloride 98 - 111 mmol/L 105 103 106  CO2 22 - 32 mmol/L 26 30 28   Calcium 8.9 - 10.3 mg/dL 7.25) ) 9.1  Total Protein 6.5 - 8.1 g/dL - - -   Total Bilirubin 0.3 - 1.2 mg/dL - - -  Alkaline Phos 38 - 126 U/L - - -  AST 15 - 41 U/L - - -  ALT 0 - 44 U/L - - -   CBC Latest Ref Rng & Units 04/05/2021 04/04/2021 04/03/2021  WBC 4.0 - 10.5 K/uL 5.2 6.3 6.7  Hemoglobin 12.0 - 15.0 g/dL 11.7(L) 11.4(L) 12.9  Hematocrit 36.0 - 46.0 % 36.7 35.8(L) 39.7  Platelets 150 - 400 K/uL 171 167 147(L)    RADS: N/a Assessment:   SBO.  Improving, tolerating clears.  Advance to regular and waiting placement per daughter request due to concern for around the clock care

## 2021-04-05 NOTE — Evaluation (Signed)
Physical Therapy Evaluation Patient Details Name: Paula Luna MRN: [id number] DOB: [birth year] Today's Date: 04/05/2021   History of Present Illness  Pt is a 87 y/o F admitted to surgical service on 04/02/21 for SBO being managed conservatively. Pt also with incidental finding of subacute T12 compression fx & neurosurgeon recommended TLSO brace. PMH: HTN, Ischemic bowel disease, L eye macular degeneration, osteoporosis, permanent a-fib  Clinical Impression  Pt seen for PT evaluation with pt initially declining use of TLSO 2/2 discomfort. Pt performs bed mobility with supervision with cuing for log rolling & use of bed rails. Pt assisted to [facility] with RW & CGA<>min assist for all transfers & pt has continent BM & void on toilet. PT assists pt with peri hygiene but pt does not maintain back precautions with activity. Pt then agreeable to donning TLSO & PT educated pt on proper positioning for increased comfort & educated pt on need to wear it OOB (pt reports she slept in it). Pt is able to ambulate in hallway to nurses station & back with RW & CGA with forward trunk lean & pushing RW too far out in front but pt with decreased awareness of use of AD as she uses a rollator at baseline. Pt would benefit from HHPT f/u at d/c but will continue to see pt acutely to progress mobility as able.    Follow Up Recommendations Home health PT;Supervision/Assistance - 24 hour    Equipment Recommendations  Rolling walker with 5" wheels    Recommendations for Other Services       Precautions / Restrictions Precautions Precautions: Back;Fall Required Braces or Orthoses: Spinal Brace Spinal Brace: Thoracolumbosacral orthotic Restrictions Weight Bearing Restrictions: No      Mobility  Bed Mobility Overal bed mobility: Needs Assistance Bed Mobility: Rolling;Sidelying to Sit Rolling: Supervision Sidelying to sit: Supervision;HOB elevated       General bed mobility comments: bed rails, cuing for log  rolling    Transfers Overall transfer level: Needs assistance Equipment used: Rolling walker (2 wheeled) Transfers: Sit to/from [employer] Sit to Stand: Min guard;Min assist Stand pivot transfers: Min guard;Min assist          Ambulation/Gait Ambulation/Gait assistance: Min guard Gait Distance (Feet): 120 Feet Assistive device: Rolling walker (2 wheeled) Gait Pattern/deviations: Trunk flexed;Decreased step length - right;Decreased step length - left;Decreased stride length Gait velocity: decreased   General Gait Details: extra time for obstacle avoidance  Stairs            Wheelchair Mobility    Modified Rankin (Stroke Patients Only)       Balance Overall balance assessment: Needs assistance Sitting-balance support: No upper extremity supported;Feet supported Sitting balance-Leahy Scale: Good     Standing balance support: Bilateral upper extremity supported;Single extremity supported;During functional activity Standing balance-Leahy Scale: Fair Standing balance comment: requires at least 1UE support & CGA<>min assist for standing balance                             Pertinent Vitals/Pain Pain Assessment:  (pt c/o discomfort with TLSO & PT adjusted it & ultimately doffed it at end of session while pt sits in recliner)    Home Living Family/patient expects to be discharged to:: Private residence Living Arrangements: Children (sitters) Available Help at Discharge: Family;Available 24 hours/day (sitters) Type of Home: House Home Access: Ramped entrance     Home Layout: Two level;Able to live on main level with bedroom/bathroom Home  Equipment: Gilmer Mor - single point (rollator) Additional Comments: Lives with daughter & has personal sitters    Prior Function Level of Independence: Needs assistance   Gait / Transfers Assistance Needed: ambulatory with rollator with someone close by providing supervision  ADL's / Homemaking  Assistance Needed: Daughter assists with bathing & dressing. Daughter manages household chores.  Comments: hx of 2 falls in past 6 months     Hand Dominance   Dominant Hand: Right    Extremity/Trunk Assessment   Upper Extremity Assessment Upper Extremity Assessment: Generalized weakness    Lower Extremity Assessment Lower Extremity Assessment: Generalized weakness    Cervical / Trunk Assessment Cervical / Trunk Assessment: Kyphotic  Communication   Communication: No difficulties  Cognition Arousal/Alertness: Awake/alert Behavior During Therapy: WFL for tasks assessed/performed Overall Cognitive Status: Within Functional Limits for tasks assessed                                        General Comments General comments (skin integrity, edema, etc.): Pt with incontinent BM smear then continent void & BM on BSC with PT assisting with peri hygiene.    Exercises     Assessment/Plan    PT Assessment Patient needs continued PT services  PT Problem List Decreased strength;Decreased mobility;Decreased knowledge of precautions;Decreased activity tolerance;Decreased balance;Decreased knowledge of use of DME       PT Treatment Interventions DME instruction;Therapeutic activities;Modalities;Gait training;Therapeutic exercise;Patient/family education;Balance training;Functional mobility training;Neuromuscular re-education;Manual techniques    PT Goals (Current goals can be found in the Care Plan section)  Acute Rehab PT Goals Patient Stated Goal: go home PT Goal Formulation: With patient Time For Goal Achievement: 04/19/21 Potential to Achieve Goals: Good    Frequency Min 2X/week   Barriers to discharge        Co-evaluation               AM-PAC PT "6 Clicks" Mobility  Outcome Measure Help needed turning from your back to your side while in a flat bed without using bedrails?: A Little Help needed moving from lying on your back to sitting on the side  of a flat bed without using bedrails?: A Little Help needed moving to and from a bed to a chair (including a wheelchair)?: A Little Help needed standing up from a chair using your arms (e.g., wheelchair or bedside chair)?: A Little Help needed to walk in hospital room?: A Little Help needed climbing 3-5 steps with a railing? : A Lot 6 Click Score: 17    End of Session Equipment Utilized During Treatment: Back brace Activity Tolerance: Patient tolerated treatment well Patient left: in chair;with call bell/phone within reach;with chair alarm set (reviewed use of call bell with pt able to feel nursing call button (pt with impaired vision) - notified nursing staff of pt's need for single push call button 2/2 impaired vision when one is available)   PT Visit Diagnosis: Unsteadiness on feet (R26.81);Difficulty in walking, not elsewhere classified (R26.2);Muscle weakness (generalized) (M62.81)    Time: 7902-4097 PT Time Calculation (min) (ACUTE ONLY): 44 min   Charges:   PT Evaluation $PT Eval Low Complexity: 1 Low PT Treatments $Therapeutic Activity: 23-37 mins        Aleda Grana, PT, DPT 04/05/21, 12:03 PM   Sandi Mariscal 04/05/2021, 11:59 AM

## 2021-04-05 NOTE — Care Management Important Message (Signed)
Important Message  Patient Details  Name: Paula Luna MRN: [id number] Date of Birth: [birth year]   Medicare Important Message Given:  Yes  Reviewed Medicare IM with patient in room and daughter, Roger Shelter, on phone.     Johnell Comings 04/05/2021, 1:22 PM

## 2021-04-06 ENCOUNTER — Telehealth: Payer: Self-pay

## 2021-04-06 LAB — MAGNESIUM: Magnesium: 2.2 mg/dL (ref 1.7–2.4)

## 2021-04-06 LAB — PHOSPHORUS: Phosphorus: 3.6 mg/dL (ref 2.5–4.6)

## 2021-04-06 MED ORDER — BISACODYL 10 MG RE SUPP
10.0000 mg | Freq: Once | RECTAL | Status: AC
  Administered 2021-04-06: 10 mg via RECTAL
  Filled 2021-04-06: qty 1

## 2021-04-06 NOTE — Telephone Encounter (Signed)
[name], patient's daughter, called wanting to let Dr Patsy Lager know that patient is been discharged today. Hospital is suppose to set patient up with [facility] for PT. Hospital provider also said patient needs 24 hour case, [name] states she can not do that with her work schedule but will be with patient when she is not working. [name] wanted to make sure Dr Patsy Lager was aware of everything. If Dr Patsy Lager knows of anyone that [name] could hire for part time to help patient with bathing and personal care to let her know. Thank you.

## 2021-04-06 NOTE — Discharge Instructions (Signed)
Wear Brace as instructed.    Followup with Dr. Adriana Simas for T12 fracture  Followup as needed with Dr. Everlene Farrier or Dr. Tonna Boehringer for small bowel obstruction

## 2021-04-06 NOTE — Progress Notes (Signed)
Discharge packet given to patients daughter. Questions answered. IV removed by day shift. TLSO brace sent with pt. Wheeled to pt transpo with daughter.

## 2021-04-06 NOTE — TOC Initial Note (Signed)
Transition of Care [facility]) - Initial/Assessment Note    Patient Details  Name: Paula Luna MRN: [id number] Date of Birth: September 01, 1925  Transition of Care [facility] CM/SW Contact:    Chapman Fitch, RN Phone Number: 04/06/2021, 3:41 PM  Clinical Narrative:                 Patient to discharge today Daughter to transport today after she gets off work at 730 Patient lives at home with daughter.  Has private care givers sometimes during the day. Daughter working on arranging increased time of sitters  PT has recommended home health Daughter agreeable.  [facility].  Referral made and accepted by Huntley Dec with [facility] Patient has rollator and cane in the home  Expected Discharge Plan: Home w Home Health Services Barriers to Discharge: No Barriers Identified   Patient Goals and CMS Choice        Expected Discharge Plan and Services Expected Discharge Plan: Home w Home Health Services         Expected Discharge Date: 04/06/21                         HH Arranged: OT, PT, Nurse's Aide HH Agency: [facility] Date [facility] Agency Contacted: 04/06/21   Representative spoke with at [facility] Agency: Huntley Dec  Prior Living Arrangements/Services   Lives with:: Adult Children              Current home services: DME    Activities of Daily Living Home Assistive Devices/Equipment: Wheelchair, Environmental consultant (specify type) (4-wheels) ADL Screening (condition at time of admission) Patient's cognitive ability adequate to safely complete daily activities?: Yes Is the patient deaf or have difficulty hearing?: No Does the patient have difficulty seeing, even when wearing glasses/contacts?: Yes (Left eye is cloudy) Does the patient have difficulty concentrating, remembering, or making decisions?: No Patient able to express need for assistance with ADLs?: Yes Does the patient have difficulty dressing or bathing?: Yes Independently performs ADLs?: No Communication: Independent Dressing (OT):  Needs assistance Is this a change from baseline?: Pre-admission baseline Grooming: Needs assistance Is this a change from baseline?: Pre-admission baseline Feeding: Independent Bathing: Needs assistance Is this a change from baseline?: Pre-admission baseline Toileting: Needs assistance Is this a change from baseline?: Pre-admission baseline In/Out Bed: Needs assistance Is this a change from baseline?: Change from baseline, expected to last >3 days Walks in Home: Independent with device (comment) (walker) Does the patient have difficulty walking or climbing stairs?: Yes Weakness of Legs: None Weakness of Arms/Hands: None  Permission Sought/Granted                  Emotional Assessment              Admission diagnosis:  Small bowel obstruction (HCC) [K56.609] SBO (small bowel obstruction) (HCC) [K56.609] Encounter for imaging study to confirm nasogastric (NG) tube placement [Z01.89] Patient Active Problem List   Diagnosis Date Noted   Sinus pause 04/03/2021   Malnutrition of moderate degree 10/05/2020   SBO (small bowel obstruction) (HCC) 10/01/2020   Hypokalemia 10/01/2020   Atrial fibrillation with slow ventricular response (HCC) 04/26/2017   Ischemic bowel disease (HCC) 03/18/2017   Ischemic necrosis of small bowel (HCC)    Macular degeneration of left eye 01/06/2015   Essential hypertension 12/25/2013   PCP:  Hannah Beat, MD Pharmacy:   [facility] [address], [place] - [address] [place] [address] Phone:  [phone] Fax: [phone]  [id number] - [place], [place] - [address] [place] [address] Phone: [phone] Fax: [phone]     Social Determinants of Health (SDOH) Interventions    Readmission Risk Interventions No flowsheet data found.

## 2021-04-07 ENCOUNTER — Telehealth: Payer: Self-pay | Admitting: Internal Medicine

## 2021-04-07 ENCOUNTER — Telehealth: Payer: Self-pay

## 2021-04-07 DIAGNOSIS — R55 Syncope and collapse: Secondary | ICD-10-CM

## 2021-04-07 NOTE — Telephone Encounter (Signed)
Tried calling the patient they didn't answer, No one answered the phone unable to leave an VM. Will try to calling again tomorrow.

## 2021-04-07 NOTE — Telephone Encounter (Signed)
Ok.  They will want a call from you.  I am glad she is doing better.  Normal urination and BM is most important after a bowel obstruction.  Follow-up with me next week.
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
## 2021-04-07 NOTE — Telephone Encounter (Signed)
Transition Care Management Follow-up Telephone Call Date of discharge and from where: 04/06/2021, [facility] How have you been since you were released from the hospital? Spoke with daughter, [name] (HIPPA verified). Patient is doing okay. At home resting right now. Any questions or concerns? Yes, very upset with her treatment during her hospital stay.   Items Reviewed: Did the pt receive and understand the discharge instructions provided? Yes  Medications obtained and verified? Yes  Other? No  Any new allergies since your discharge? No  Dietary orders reviewed? Yes Do you have support at home? Yes   Home Care and Equipment/Supplies: Were home health services ordered? not applicable If so, what is the name of the agency? N/A  Has the agency set up a time to come to the patient's home? not applicable Were any new equipment or medical supplies ordered?  No What is the name of the medical supply agency? N/A Were you able to get the supplies/equipment? not applicable Do you have any questions related to the use of the equipment /or supplies? No  Functional Questionnaire: (I = Independent and D = Dependent) ADLs: D  Bathing/Dressing- D  Meal Prep- D  Eating- I  Maintaining continence- I  Transferring/Ambulation- D  Managing Meds- D  Follow up appointments reviewed:  PCP Hospital f/u appt confirmed? No  Sent message to Dr. Patsy Lager this morning and waiting to hear back from his nurse about what he recommends. Specialist Hospital f/u appt confirmed? Yes  Scheduled to see Dr. Okey Dupre on 04/30/21 @ 10 am. Are transportation arrangements needed? No  If their condition worsens, is the pt aware to call PCP or go to the Emergency Dept.? Yes Was the patient provided with contact information for the PCP's office or ED? Yes Was to pt encouraged to call back with questions or concerns? Yes
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
## 2021-04-07 NOTE — Telephone Encounter (Signed)
Noted  

## 2021-04-07 NOTE — Discharge Summary (Signed)
Physician Discharge Summary  Patient ID: Paula Luna MRN: [id number] DOB/AGE: May 17, 1925 87 y.o.  Admit date: 04/02/2021 Discharge date: 04/06/2021  Admission Diagnoses: SBO  Discharge Diagnoses:  Same as above  Discharged Condition: good  Hospital Course: Admitted for above.  Decompression with NG tube.  Small bowel follow-through showed contrast in the colon.  Diet advanced slowly and patient eventually had a bowel movement and NG removed.  At time of discharge patient was able to tolerate a diet.  Daughter concerned about her repeated admissions and concern for lack of supervision at home so home health was organized.  Patient also had incidental T12 compression fracture that was noted during initial work-up with CT.  Dr. Adriana Simas neurosurgery consulted and recommended TLSO brace and follow-up as an outpatient basis.  This was arranged as well.  Consults:  Neurosurgeon  Discharge Exam: Blood pressure (!) 137/54, pulse 60, temperature 98.2 F (36.8 C), temperature source Oral, resp. rate 16, height 4\' 11"  (1.499 m), weight 56.2 kg, SpO2 98 %. General appearance: alert, cooperative, and no distress GI: soft, non-tender; bowel sounds normal; no masses,  no organomegaly  Disposition:  Discharge disposition: 06-Home-Health Care Svc       Discharge Instructions     Discharge patient   Complete by: As directed    Discharge disposition: 06-Home-Health Care Svc   Discharge patient date: 04/06/2021      Allergies as of 04/06/2021       Reactions   Latex Rash   Tetanus Toxoid Anaphylaxis   REACTION: anaphylaxis   Tramadol Other (See Comments)   Caused severe constipation---bowel obstruction   Alendronate Sodium    REACTION: passed out        Medication List     TAKE these medications    amLODipine 5 MG tablet Commonly known as: NORVASC Take 1 tablet (5 mg total) by mouth daily.   docusate sodium 100 MG capsule Commonly known as: COLACE Take 200 mg by mouth  daily.   fluticasone 50 MCG/ACT nasal spray Commonly known as: FLONASE Use 2 spray(s) in each nostril once daily   furosemide 20 MG tablet Commonly known as: LASIX Take 1 tablet (20 mg total) by mouth 3 (three) times a week.   ICaps Caps Take 1 capsule by mouth 2 (two) times daily.   lisinopril 40 MG tablet Commonly known as: ZESTRIL Take 1 tablet by mouth once daily   metoprolol succinate 25 MG 24 hr tablet Commonly known as: TOPROL-XL Take 0.5 tablets (12.5 mg total) by mouth daily.   Vitamin D 50 MCG (2000 UT) Caps Take 1 capsule by mouth daily.   Xarelto 15 MG Tabs tablet Generic drug: Rivaroxaban TAKE 1 TABLET BY MOUTH ONCE DAILY WITH SUPPER        Follow-up Information     04/08/2021, MD Follow up in 1 week(s).   Specialty: Neurosurgery Why: follow up T12 compression fracture. consult discussed with Dr. Lucy Chris information: [address] [place] [phone]                  Total time spent arranging discharge was >66min. Signed: 31m 04/07/2021, 8:10 PM

## 2021-04-07 NOTE — Telephone Encounter (Addendum)
Spoke with the patients daughter [name]. Apologized for their inpatient experience.  [name] sts that she does not want to file an official complaint.  [name] has several questions regarding home health services and around the clock care needed for the patient. [name] has called the pt pcp and she is awaiting a call back. Suezanne Jacquet we would defer to Dr. Patsy Lager.  [name] is at home with the patient today and the pt is doing ok. No recurring syncope. [name] wanted Dr. Okey Dupre updated regarding the episode that occurred yesterday evening after the pt returned home from the hospital.  Suezanne Jacquet that I will fwd the update to Dr. Okey Dupre as requested. Suezanne Jacquet of 911 precaution for recurring syncope.

## 2021-04-08 ENCOUNTER — Telehealth: Payer: Self-pay | Admitting: *Deleted

## 2021-04-08 DIAGNOSIS — H3530 Unspecified macular degeneration: Secondary | ICD-10-CM | POA: Diagnosis not present

## 2021-04-08 DIAGNOSIS — Z7951 Long term (current) use of inhaled steroids: Secondary | ICD-10-CM | POA: Diagnosis not present

## 2021-04-08 DIAGNOSIS — M810 Age-related osteoporosis without current pathological fracture: Secondary | ICD-10-CM | POA: Diagnosis not present

## 2021-04-08 DIAGNOSIS — I10 Essential (primary) hypertension: Secondary | ICD-10-CM | POA: Diagnosis not present

## 2021-04-08 DIAGNOSIS — Z9151 Personal history of suicidal behavior: Secondary | ICD-10-CM | POA: Diagnosis not present

## 2021-04-08 DIAGNOSIS — K56609 Unspecified intestinal obstruction, unspecified as to partial versus complete obstruction: Secondary | ICD-10-CM | POA: Diagnosis not present

## 2021-04-08 DIAGNOSIS — K6389 Other specified diseases of intestine: Secondary | ICD-10-CM | POA: Diagnosis not present

## 2021-04-08 DIAGNOSIS — I4821 Permanent atrial fibrillation: Secondary | ICD-10-CM | POA: Diagnosis not present

## 2021-04-08 DIAGNOSIS — Z7901 Long term (current) use of anticoagulants: Secondary | ICD-10-CM | POA: Diagnosis not present

## 2021-04-08 DIAGNOSIS — E46 Unspecified protein-calorie malnutrition: Secondary | ICD-10-CM | POA: Diagnosis not present

## 2021-04-08 NOTE — Telephone Encounter (Signed)
I spoke with the patient's daughter, [name]. I have advised her of Dr. Serita Kyle recommendations to wear a ZIO AT monitor x 2 weeks to monitor her syncope. Per [name], the patient is currently sleeping and she will need to discuss this with her when she wakes up. I have advised [name], that she may call us back at any time to let us know the patient's thoughts on the monitor. [name] voices understanding and was appreciative for the call back.

## 2021-04-08 NOTE — Telephone Encounter (Signed)
Verbal orders given to [facility] for PT 1 x week for 6 weeks.  Also advised if Pedialyte looks okay then it is probably okay to drink per Dr. Patsy Lager.

## 2021-04-08 NOTE — Telephone Encounter (Signed)
I am sorry to hear about Ms. Binegar's syncope and bad experience with recent hospital discharge.  I recommend that we have her wear a Zio AT for 2 weeks for further assessment of syncope.  If she has further syncope or other concerns, she should return to the ED immediately.  Yvonne Kendall, MD [facility]

## 2021-04-08 NOTE — Telephone Encounter (Signed)
[name] notified as instructed by telephone.  Hospital follow up scheduled with Dr. Patsy Lager 04/14/2021 at 9:40 am.  [name] wanted to let Dr. Patsy Lager know that Ms. Mulcahey is not wearing the back brace they sent her home with.  It digs into her neck but she seems fine without it.  Dr. Okey Dupre is going to send out a heart monitor for her to wear for a couple of week to see if anything is going on with her heart .  She states Ms. Luper passed out again the night she came home from the hospital.   She just wanted to make Dr. Patsy Lager aware of these things.

## 2021-04-08 NOTE — Telephone Encounter (Signed)
Verbal orders to approve PT approved.  They can drink Pedialyte.  No concerns.  Similar to like finding a bottle of Gatorade.

## 2021-04-08 NOTE — Telephone Encounter (Signed)
Orlene Erm, PT with [facility] called for 2 issues.   She was at pt's home now doing the initial PT assessment, they received orders from hospital. They need PCP to give verbal orders to continue PT 1x a week for 6 weeks.  While there family found a unopened bottle of Pedialyte in pt's garage and it doesn't have an expiration date on it at all that they can find. It's unopened but it's been in their garage for "a long time" family wanted to see if PCP thought it was okay to drink since there isn't an expiration date on it or should they throw it out.  Shemena request call back (can leave VM) CB # [phone]

## 2021-04-09 ENCOUNTER — Ambulatory Visit (INDEPENDENT_AMBULATORY_CARE_PROVIDER_SITE_OTHER): Payer: PPO

## 2021-04-09 DIAGNOSIS — R55 Syncope and collapse: Secondary | ICD-10-CM

## 2021-04-09 NOTE — Telephone Encounter (Signed)
Patient daughter returning call  Patient would like to go ahead with the ZIO monitor  Please have mailed to patient

## 2021-04-09 NOTE — Telephone Encounter (Signed)
Spoke to pt's daughter, Paula Luna.  Discussed initiating Zio AT monitor.  Verified pt's address and [name]'s contact information to provide emergency contact to [facility].  Paula Luna asks that we use both her cell and home number (her cell has not been dependable lately).  H: [phone]  C: [phone]  [name] proceeded to ask what the out of pocket cost would be for zio monitor.  Notified [name] that I would need to contact our billing department to answer this question.  Paula Luna states she will need to know cost before proceed with ordering zio.  Message has been sent to billing. Will discuss with pt once receive reply.

## 2021-04-09 NOTE — Telephone Encounter (Signed)
Patient has no oop expense.  Per [name] go ahead and order zio AT.

## 2021-04-09 NOTE — Telephone Encounter (Signed)
Spoke to [facility] [place] and notified per billing, no oop expense. [name] also states that she s/w [employer] and was told the same information. [name] agrees to proceed with zio monitor.  Zio AT monitor ordered x 2 weeks for syncope to be mailed to pt.  Reviewed instructions with [name] over phone and advised she call with any further questions.   [name] appreciative and voiced understanding. No further questions at this time.

## 2021-04-12 NOTE — Telephone Encounter (Signed)
Pt's daughter, Paula Luna, called in regarding referral. She states she was told by [facility] that it can take up to 14 days for this to be processed. She states she had requested it to be expedited when speaking with them previously. Paula Luna states she has to work and cannot stay at home, but [name] is needing 24 hour care. Please advise.    She is also requesting a referral for custodial care with [facility]. She states she previously had someone coming out for 20 hours a week to help and she is requesting to receive this service again.

## 2021-04-12 NOTE — Telephone Encounter (Signed)
Is there any way that you can help or know how to do this?  I do not how to put in an order for it in Epic.  I have never seen insurance pay for anything close to 24 hour care.  Usually cash-pay.

## 2021-04-13 ENCOUNTER — Telehealth: Payer: Self-pay | Admitting: Family Medicine

## 2021-04-13 DIAGNOSIS — I10 Essential (primary) hypertension: Secondary | ICD-10-CM | POA: Diagnosis not present

## 2021-04-13 DIAGNOSIS — I4821 Permanent atrial fibrillation: Secondary | ICD-10-CM | POA: Diagnosis not present

## 2021-04-13 DIAGNOSIS — Z9151 Personal history of suicidal behavior: Secondary | ICD-10-CM | POA: Diagnosis not present

## 2021-04-13 DIAGNOSIS — H3530 Unspecified macular degeneration: Secondary | ICD-10-CM | POA: Diagnosis not present

## 2021-04-13 DIAGNOSIS — M810 Age-related osteoporosis without current pathological fracture: Secondary | ICD-10-CM | POA: Diagnosis not present

## 2021-04-13 DIAGNOSIS — Z7901 Long term (current) use of anticoagulants: Secondary | ICD-10-CM | POA: Diagnosis not present

## 2021-04-13 DIAGNOSIS — K6389 Other specified diseases of intestine: Secondary | ICD-10-CM | POA: Diagnosis not present

## 2021-04-13 DIAGNOSIS — K56609 Unspecified intestinal obstruction, unspecified as to partial versus complete obstruction: Secondary | ICD-10-CM | POA: Diagnosis not present

## 2021-04-13 DIAGNOSIS — E46 Unspecified protein-calorie malnutrition: Secondary | ICD-10-CM | POA: Diagnosis not present

## 2021-04-13 DIAGNOSIS — Z7951 Long term (current) use of inhaled steroids: Secondary | ICD-10-CM | POA: Diagnosis not present

## 2021-04-13 NOTE — Telephone Encounter (Signed)
Pt just got out of hospital last Tuesday. Pt daughter wanting to know if the provider can put a monitor from a company that the cardiologist requested on the pt because she does not know how to put it on. Pt daughter also stated that there is a small box that comes with the monitor that has to be ten feet from the monitor. pt has to wear the monitor for two weeks.

## 2021-04-14 ENCOUNTER — Other Ambulatory Visit: Payer: Self-pay

## 2021-04-14 ENCOUNTER — Ambulatory Visit (INDEPENDENT_AMBULATORY_CARE_PROVIDER_SITE_OTHER): Payer: PPO | Admitting: Family Medicine

## 2021-04-14 ENCOUNTER — Encounter: Payer: Self-pay | Admitting: Family Medicine

## 2021-04-14 VITALS — BP 112/70 | HR 60 | Temp 98.4°F | Ht 58.6 in | Wt 117.5 lb

## 2021-04-14 DIAGNOSIS — S22080D Wedge compression fracture of T11-T12 vertebra, subsequent encounter for fracture with routine healing: Secondary | ICD-10-CM

## 2021-04-14 DIAGNOSIS — I4891 Unspecified atrial fibrillation: Secondary | ICD-10-CM | POA: Diagnosis not present

## 2021-04-14 DIAGNOSIS — R531 Weakness: Secondary | ICD-10-CM

## 2021-04-14 DIAGNOSIS — K56609 Unspecified intestinal obstruction, unspecified as to partial versus complete obstruction: Secondary | ICD-10-CM

## 2021-04-14 DIAGNOSIS — K219 Gastro-esophageal reflux disease without esophagitis: Secondary | ICD-10-CM

## 2021-04-14 MED ORDER — PANTOPRAZOLE SODIUM 40 MG PO TBEC: 40.0000 mg | DELAYED_RELEASE_TABLET | Freq: Every day | ORAL | 3 refills | Status: DC

## 2021-04-14 NOTE — Patient Instructions (Signed)
Stop Amlodipine 

## 2021-04-14 NOTE — Progress Notes (Signed)
Paula Friesen T. Zunairah Devers, MD, [place] at [facility] [place], [address]  Phone: [phone]  FAX: [phone]  Paula Luna - 87 y.o. female  MRN [id number]  Date of Birth: 02-06-1925  Date: 04/14/2021  PCP: Owens Loffler, MD  Referral: Owens Loffler, MD  Chief Complaint  Patient presents with   Hospitalization Follow-up    This visit occurred during the SARS-CoV-2 public health emergency.  Safety protocols were in place, including screening questions prior to the visit, additional usage of staff PPE, and extensive cleaning of exam room while observing appropriate contact time as indicated for disinfecting solutions.   Subjective:   Paula Luna is a 87 y.o. very pleasant female patient with Body mass index is 24.06 kg/m. who presents with the following:  She presents for hosp f/u for SBO.  She is here today with her daughter Paula Luna.  Admit date: 04/02/2021 Discharge date: 04/06/2021  Admitted and managed conservatively with NG tube.  SB follow-through with contrast in her colon.    Daughter spoke to MD's in hospital, and [facility] has been arranged. She has had RN assessment, and PT and OT are involved in the case.   11 compression noted, Dr. Lacinda Axon rec a TLSO brace.  She has not been able to tolerate it.  Drinking some water and juice.    Burping a lot. When eating says needs to rest for a minute.   Eats, but does not like some things.   [facility] has helped and already back. RN has been to assist. OT has been there.  She requests [place] additional help from [facility] as well as [facility] long-term   ? Likes to get washed every day.  Paula Luna works 4 hours three times a week.  Huge diarrhea after d/c from hospital.   [facility] and  Home health aide.  [facility]??? - question, 3 hours a week.   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 03/2017 abd pain-->internal herina and ischemic jejunum s/p ex lap and small bowel resection.   Macular degeneration of left eye 01/06/2015   Osteoporosis    Permanent atrial fibrillation ([place])    a. First noted 03/2017;  b. 07/2017 Echo: EF 60-65%, no rwma, mild MR, mildly to mod dil LA; c. 07/2017 48hr Holter: persistent Afib, avg HR of 66 (range 39-140 bpm);  d. CHA2DS2VASc = 4-->Xarelto initiated 07/2017.    Past Surgical History:  Procedure Laterality Date   CLOSED REDUCTION PROXIMAL HUMERUS FRACTURE Left 2017   LAPAROSCOPIC SMALL BOWEL RESECTION     LAPAROTOMY N/A 03/18/2017   Procedure: EXPLORATORY LAPAROTOMY, SMALL BOWEL RESECTION;  Surgeon: Jules Husbands, MD;  Location: [facility] ORS;  Service: General;  Laterality: N/A;   TOTAL ABDOMINAL HYSTERECTOMY      Family History  Problem Relation Age of Onset   Diabetes Mother    Cancer Father        jaw  Objective:   BP 112/70   Pulse 60   Temp 98.4 F (36.9 C) (Temporal)   Ht 4' 10.6" (1.488 m)   Wt 117 lb 8 oz (53.3 kg)   SpO2 100%   BMI 24.06 kg/m   GEN: No acute distress; alert,appropriate. PULM: Breathing comfortably in no respiratory distress PSYCH: Normally interactive.  CV:  irreg, irreg PULM: Normal respiratory rate, no accessory muscle use. No wheezes, crackles or rhonchi Tr LE edema  Laboratory and Imaging Data: Lab Review:  CBC EXTENDED Latest Ref Rng & Units 04/05/2021 04/04/2021 04/03/2021  WBC 4.0 - 10.5 K/uL 5.2 6.3 6.7  RBC 3.87 - 5.11 MIL/uL 4.61 4.42 4.92  HGB 12.0 - 15.0 g/dL 11.7(L) 11.4(L)  12.9  HCT 36.0 - 46.0 % 36.7 35.8(L) 39.7  PLT 150 - 400 K/uL 171 167 147(L)  NEUTROABS 1.4 - 7.7 K/uL - - -  LYMPHSABS 0.7 - 4.0 K/uL - - -    BMP Latest Ref Rng & Units 04/05/2021 04/04/2021 04/03/2021  Glucose 70 - 99 mg/dL 109(H) 87 114(H)  BUN 8 - 23 mg/dL 16 21 25(H)  Creatinine 0.44 - 1.00 mg/dL 0.92 0.94 1.00  BUN/Creat Ratio 12 - 28 - - -  Sodium 135 - 145 mmol/L 136 138 141  Potassium 3.5 - 5.1 mmol/L 4.1 4.2 4.5  Chloride 98 - 111 mmol/L 105 103 106  CO2 22 - 32 mmol/L _0 Calcium 8.9 - 10.3 mg/dL 8.6(L) 8.8(L) 9.1    Hepatic Function Latest Ref Rng & Units 04/02/2021 11/05/2020 10/08/2020  Total Protein 6.5 - 8.1 g/dL 7.3 6.4 -  Albumin 3.5 - 5.0 g/dL 4.0 3.6 2.7(L)  AST 15 - 41 U/L 27 17 -  ALT 0 - 44 U/L 9 15 -  Alk Phosphatase 38 - 126 U/L 92 83 -  Total Bilirubin 0.3 - 1.2 mg/dL 0.9 0.4 -  Bilirubin, Direct 0.0 - 0.3 mg/dL - 0.1 -    Lab Results  Component Value Date   CHOL 166 03/18/2020   Lab Results  Component Value Date   HDL 63.10 03/18/2020   Lab Results  Component Value Date   LDLCALC 86 03/18/2020   Lab Results  Component Value Date   TRIG 88 10/03/2020   Lab Results  Component Value Date   CHOLHDL 3 03/18/2020   No results for input(s): PSA in the last 72 hours. No results found for: HCVAB Lab Results  Component Value Date   VD25OH 52.40 03/18/2020   VD25OH 41.08 02/18/2019   VD25OH 19.00 (L) 02/13/2018     Lab Results  Component Value Date   HGBA1C 5.8 02/18/2019   HGBA1C 5.5 03/19/2017   Lab Results  Component Value Date   LDLCALC 86 03/18/2020   CREATININE 0.92 04/05/2021     Assessment and Plan:     ICD-10-CM   1. SBO (small bowel obstruction) ([place])  K56.609 Ambulatory referral to Home Health    2. Weakness  R53.1 Ambulatory referral to Home Health    3. Compression fracture of T11 vertebra with routine healing, subsequent encounter  S22.080D Ambulatory referral to [facility]    4. Gastroesophageal reflux  disease, unspecified whether esophagitis present  K21.9     5. Atrial fibrillation with slow ventricular response (HCC)  I48.91 Ambulatory referral to Home Health     Total encounter time: 40 minutes. This includes total time spent on the day of encounter.  On the day of the encounter, this includes extensive  chart review of notes, imaging, labs.  Additional time spent in consultation and questions answered by the patient's daughter and as well as the patient herself.  Small bowel obstruction resolving.  Eating and drinking.  Recommended that she eat anything that she would like and drink anything that she would like.  She continues to be weak, again recommended nutrition, and PT and OT are involved.  Burping with GERD.  Initiate Protonix.  T11 compression fracture, and this does not cause any pain.  There is no reason to use her TLSO.  A. fib, the hospital wanted the patient to use an event monitor.  She brings this into the office today.  I have no idea how to set this up, and I recommended that she call cardiology nursing.  She and her daughter and request home health assistance for placement of a home aide.  They have spoken to their insurance provider at health team and manage, and they are under the impression that they can get a home health aide through [facility] initially with more longer term from [facility].  All we can do is ask these questions and try to assist with arranging this if it is possible through her health insurance.  Home Health documentation: Face to face encounter documentation follows: Patient needs home health services for the following reasons:  Home health services needed: 1. HHRN assessment, complete.  [facility] is involved.  2.  PT and OT are involved. 3.  The patient requests acute home health aide right now, and more longer-term home health related through [facility]. Patient homebound for the following reasons who: Generalized debility at age 24  with macular degeneration and imbalance.  Meds ordered this encounter  Medications   pantoprazole (PROTONIX) 40 MG tablet    Sig: Take 1 tablet (40 mg total) by mouth daily.    Dispense:  90 tablet    Refill:  3   Medications Discontinued During This Encounter  Medication Reason   metoprolol succinate (TOPROL-XL) 25 MG 24 hr tablet Discontinued by provider   amLODipine (NORVASC) 5 MG tablet    Orders Placed This Encounter  Procedures   Ambulatory referral to [place]     Follow-up: Return in about 3 months (around 07/15/2021) for general health recheck.  Dragon [employer] One speech-to-text software was used for transcription in this dictation.  Possible transcriptional errors can occur using Editor, commissioning.   Signed,  Maud Deed. Naol Ontiveros, MD   Outpatient Encounter Medications as of 04/14/2021  Medication Sig   Cholecalciferol (VITAMIN D) 50 MCG (2000 UT) CAPS Take 1 capsule by mouth daily.   docusate sodium (COLACE) 100 MG capsule Take 200 mg by mouth daily.   fluticasone (FLONASE) 50 MCG/ACT nasal spray Use 2 spray(s) in each nostril once daily   furosemide (LASIX) 20 MG tablet Take 1 tablet (20 mg total) by mouth 3 (three) times a week.   lisinopril (ZESTRIL) 40 MG tablet Take 1 tablet by mouth once daily   Multiple Vitamins-Minerals (ICAPS) CAPS Take 1 capsule by mouth 2 (two) times daily.    pantoprazole (PROTONIX) 40 MG tablet Take 1 tablet (40 mg total) by mouth daily.   Rivaroxaban (XARELTO) 15 MG TABS tablet TAKE 1 TABLET BY MOUTH ONCE DAILY WITH SUPPER   [DISCONTINUED] amLODipine (NORVASC) 5 MG tablet Take 1 tablet (5 mg total) by mouth daily.   [DISCONTINUED] metoprolol succinate (TOPROL-XL) 25 MG 24 hr tablet Take 0.5 tablets (12.5 mg total) by mouth daily.  No facility-administered encounter medications on file as of 04/14/2021.

## 2021-04-14 NOTE — Telephone Encounter (Signed)
I do not know how.  Will discuss this AM.

## 2021-04-15 ENCOUNTER — Telehealth: Payer: Self-pay | Admitting: Internal Medicine

## 2021-04-15 DIAGNOSIS — R55 Syncope and collapse: Secondary | ICD-10-CM

## 2021-04-15 NOTE — Telephone Encounter (Signed)
*  STAT* If patient is at the pharmacy, call can be transferred to refill team.   1. Which medications need to be refilled? (please list name of each medication and dose if known)    Lisinopril 40 mg po q d   2. Which pharmacy/location (including street and city if local pharmacy) is medication to be sent to? [facility] [address] [place]   3. Do they need a 30 day or 90 day supply? 30

## 2021-04-15 NOTE — Telephone Encounter (Signed)
ZIO calling with abnormal EKG

## 2021-04-15 NOTE — Telephone Encounter (Signed)
Call received from [employer]. The patient came in today and had a ZIO ZT monitor placed.   Monitor ordered by Dr. Okey Dupre on 04/08/21 after the patient's daughter called the office stating the patient had been in the hospital for syncope and had another syncopal event at home.  See 04/07/21 phone note.  Per [employer], the patient had her 1st run of atrial fibrillation at 1158 am. HR- 64 bpm & the strip ran for 90 seconds.  In reviewing the patient's chart, she has known permanent atrial fibrillation and is on long term anticoagulation (Xarelto 15 mg once daily).    Unsure if notification criteria need to be adjusted for this patient due to her permanent AF.  To Dr. Okey Dupre to review/ advise.

## 2021-04-15 NOTE — Telephone Encounter (Signed)
Se 04/15/2021 OV notes.

## 2021-04-16 ENCOUNTER — Telehealth: Payer: Self-pay | Admitting: Internal Medicine

## 2021-04-16 DIAGNOSIS — R55 Syncope and collapse: Secondary | ICD-10-CM | POA: Diagnosis not present

## 2021-04-16 MED ORDER — LISINOPRIL 40 MG PO TABS: 40.0000 mg | ORAL_TABLET | Freq: Every day | ORAL | 2 refills | Status: DC

## 2021-04-16 NOTE — Telephone Encounter (Signed)
Patient has h/o permanent a-fib.  Notification criteria should be changed to alert [place] for HR < 35 bpm, pause greater than 3 seconds, or HR > 150 bpm for more than 1 minute.  No medication changes at this time.  Yvonne Kendall, MD [facility]

## 2021-04-16 NOTE — Telephone Encounter (Signed)
Call sent directly to this RN from Scheduling.  Zio rep calling with an alert for the patient:  Slow A-fib at 40 bpm that lasted ~ 60 seconds. This occurred at 1:00 am.  This was an automatic alert.   The patient has permanent Atrial Fib.  To Dr. Okey Dupre to review.

## 2021-04-16 NOTE — Telephone Encounter (Signed)
Requested Prescriptions   Signed Prescriptions Disp Refills   lisinopril (ZESTRIL) 40 MG tablet 30 tablet 2    Sig: Take 1 tablet (40 mg total) by mouth daily.    Authorizing Provider: END, CHRISTOPHER    Ordering User: Kendrick Fries

## 2021-04-16 NOTE — Telephone Encounter (Signed)
Spoke with Sharyl Nimrod with [employer] and pt's notification criteria have been changed to alert [place] for HR < 35 bpm, pause greater than 3 seconds, or HR > 150 bpm for more than 1 minute.

## 2021-04-16 NOTE — Telephone Encounter (Signed)
  1. Is this related to a heart monitor you are wearing?  (If the patient says no, please ask     if they are caling about ICD/pacemaker.) yes  2. What is your issue?? Abnormal zio per I rhythm  (If the patient is calling for results of the heart monitor this     message should be sent to nurse.)   Please route to covering RN/CMA/RMA for results. Route to monitor technicians or your monitor tech representative for your site for any technical concerns

## 2021-04-20 DIAGNOSIS — E46 Unspecified protein-calorie malnutrition: Secondary | ICD-10-CM | POA: Diagnosis not present

## 2021-04-20 DIAGNOSIS — Z7901 Long term (current) use of anticoagulants: Secondary | ICD-10-CM | POA: Diagnosis not present

## 2021-04-20 DIAGNOSIS — K6389 Other specified diseases of intestine: Secondary | ICD-10-CM | POA: Diagnosis not present

## 2021-04-20 DIAGNOSIS — I4821 Permanent atrial fibrillation: Secondary | ICD-10-CM | POA: Diagnosis not present

## 2021-04-20 DIAGNOSIS — Z7951 Long term (current) use of inhaled steroids: Secondary | ICD-10-CM | POA: Diagnosis not present

## 2021-04-20 DIAGNOSIS — M810 Age-related osteoporosis without current pathological fracture: Secondary | ICD-10-CM | POA: Diagnosis not present

## 2021-04-20 DIAGNOSIS — Z9151 Personal history of suicidal behavior: Secondary | ICD-10-CM | POA: Diagnosis not present

## 2021-04-20 DIAGNOSIS — I10 Essential (primary) hypertension: Secondary | ICD-10-CM | POA: Diagnosis not present

## 2021-04-20 DIAGNOSIS — K56609 Unspecified intestinal obstruction, unspecified as to partial versus complete obstruction: Secondary | ICD-10-CM | POA: Diagnosis not present

## 2021-04-20 DIAGNOSIS — H3530 Unspecified macular degeneration: Secondary | ICD-10-CM | POA: Diagnosis not present

## 2021-04-23 NOTE — Telephone Encounter (Signed)
Update --  Paula Luna has accepted this patient and is starting services next week ( 04/26/21 week) They are also looking into [place] to see if they can find any information

## 2021-04-26 NOTE — Telephone Encounter (Signed)
Mrs. Ane Payment called in and stated that [facility] came last week but didn't do anything and she was waiting to get washed up. And wanted to know about the referral for custodial care with [facility]. She states she previously had someone coming out for 20 hours a week to help and she is requesting to receive this service again.

## 2021-04-27 ENCOUNTER — Telehealth: Payer: Self-pay | Admitting: Family Medicine

## 2021-04-27 DIAGNOSIS — E441 Mild protein-calorie malnutrition: Secondary | ICD-10-CM

## 2021-04-27 DIAGNOSIS — R2689 Other abnormalities of gait and mobility: Secondary | ICD-10-CM

## 2021-04-27 DIAGNOSIS — H543 Unqualified visual loss, both eyes: Secondary | ICD-10-CM

## 2021-04-27 DIAGNOSIS — R54 Age-related physical debility: Secondary | ICD-10-CM

## 2021-04-27 NOTE — Telephone Encounter (Signed)
agree

## 2021-04-27 NOTE — Telephone Encounter (Signed)
Left message for Paula Luna giving verbal orders for custodial care.

## 2021-04-27 NOTE — Telephone Encounter (Signed)
Home Health verbal orders Caller Name: bruce Agency Name: [facility] number: [phone], fax [phone]  Requesting OT/PT/Skilled nursing/Social Work/Speech: custodial care  Reason: had a recent hospital visit,  Frequency:   Please forward to [facility] pool or providers CMA

## 2021-04-27 NOTE — Telephone Encounter (Addendum)
I was able to track down a number for [facility] [phone]  Called and they were able to locate her account Requested services be started back ASAP They are going to reach out to the Daughter today  They are shooting for the eval/assessment 04/27/21 They will fax any orders needed to Dr Patsy Lager  Called Daughter [facility]) - she is aware  Nothing further needed.

## 2021-04-28 ENCOUNTER — Telehealth: Payer: Self-pay | Admitting: Family Medicine

## 2021-04-28 NOTE — Telephone Encounter (Signed)
Please write order for custodial care with Dx code so I can fax to [place].

## 2021-04-28 NOTE — Chronic Care Management (AMB) (Signed)
  Chronic Care Management   Note  04/28/2021 Name: Paula Luna MRN: [id number] DOB: Jul 24, 1925  Paula Luna is a 87 y.o. year old female who is a primary care patient of Copland, Karleen Hampshire, MD. I reached out to [name] by phone today in response to a referral sent by Paula Luna's PCP, Copland, Karleen Hampshire, MD.   Paula Luna was given information about Chronic Care Management services today including:  CCM service includes personalized support from designated clinical staff supervised by her physician, including individualized plan of care and coordination with other care providers 24/7 contact phone numbers for assistance for urgent and routine care needs. Service will only be billed when office clinical staff spend 20 minutes or more in a month to coordinate care. Only one practitioner may furnish and bill the service in a calendar month. The patient may stop CCM services at any time (effective at the end of the month) by phone call to the office staff.   Paula Luna  verbally agreed to assistance and services provided by embedded care coordination/care management team today.  Follow up plan:   Paula Luna, Paula Luna

## 2021-04-28 NOTE — Telephone Encounter (Signed)
Please write order on prescription pad that states: Custodial Care and include Dx codes.

## 2021-04-28 NOTE — Telephone Encounter (Signed)
[name] notified by telephone that it is okay to give her mother a glass of warm water with lemon every morning per Dr. Patsy Lager.

## 2021-04-28 NOTE — Telephone Encounter (Signed)
ICD-10-CM   1. Age-related physical debility  R54     2. Mild protein-calorie malnutrition (HCC)  E44.1     3. Balance problem  R26.89     4. Decreased vision in both eyes  H54.3

## 2021-04-28 NOTE — Telephone Encounter (Signed)
Pt daughter called wanting to know if it was ok to give her mother a glass of warm water with lemon every morning

## 2021-04-28 NOTE — Telephone Encounter (Signed)
Orders faxed to [phone].

## 2021-04-28 NOTE — Telephone Encounter (Signed)
yes

## 2021-04-28 NOTE — Telephone Encounter (Signed)
[name] called he needs a written order for services  Callback number: [phone], fax [phone]

## 2021-04-28 NOTE — Telephone Encounter (Signed)
Done, on desk.

## 2021-04-30 ENCOUNTER — Other Ambulatory Visit: Payer: Self-pay

## 2021-04-30 ENCOUNTER — Ambulatory Visit: Payer: PPO | Admitting: Internal Medicine

## 2021-04-30 ENCOUNTER — Encounter: Payer: Self-pay | Admitting: Internal Medicine

## 2021-04-30 VITALS — BP 130/80 | HR 78 | Wt 119.0 lb

## 2021-04-30 DIAGNOSIS — I4821 Permanent atrial fibrillation: Secondary | ICD-10-CM

## 2021-04-30 DIAGNOSIS — I10 Essential (primary) hypertension: Secondary | ICD-10-CM

## 2021-04-30 DIAGNOSIS — R600 Localized edema: Secondary | ICD-10-CM

## 2021-04-30 NOTE — Progress Notes (Signed)
Follow-up Outpatient Visit Date: 04/30/2021  Primary Care Provider: Hannah Beat, MD [address] [place] [address]  Chief Complaint: Follow-up syncope and bradycardia  HPI:  Paula Luna is a 87 y.o. female with history of permanent atrial fibrillation, hypertension, ischemic bowel status post bowel resection with recent hospitalization for SBO, osteoporosis, and anemia, who presents for follow-up of syncope.  I last saw her during her hospitalization last month with small bowel obstruction that was managed conservatively.  Hospital course was complicated by bradycardia.  Metoprolol succinate was held.  Shortly after discharge, the patient had a syncopal episode at home prompting [place] to place a 14-day event monitor for further assessment.  Episodes of slow atrial fibrillation were noted with ventricular rates down to 40 bpm for approximately 60 seconds while the patient was asleep.  Today, Paula Luna reports that she has been doing fairly well.  She is ambulating some with her walker and feels like her strength is improving.  She has not had any further syncope or significant lightheadedness.  She notes mild shortness of breath when doing a few "laps" around the house with her walker.  She has not had any chest pain or palpitations.  Blood pressure has been normal, noting that she is off both amlodipine and metoprolol at this time.  She remains on lisinopril.  Her appetite has been good.  She is staying well-hydrated.  She has not had any abdominal pain or nausea.  She denies bleeding.  --------------------------------------------------------------------------------------------------  Past Medical History:  Diagnosis Date   Hypertension 12/25/2013   Ischemic bowel disease (HCC)    a. 03/2017 abd pain-->internal herina and ischemic jejunum s/p ex lap and small bowel resection.   Macular degeneration of left eye 01/06/2015   Osteoporosis    Permanent atrial fibrillation (HCC)     a. First noted 03/2017;  b. 07/2017 Echo: EF 60-65%, no rwma, mild MR, mildly to mod dil LA; c. 07/2017 48hr Holter: persistent Afib, avg HR of 66 (range 39-140 bpm);  d. CHA2DS2VASc = 4-->Xarelto initiated 07/2017.   Past Surgical History:  Procedure Laterality Date   CLOSED REDUCTION PROXIMAL HUMERUS FRACTURE Left 2017   LAPAROSCOPIC SMALL BOWEL RESECTION     LAPAROTOMY N/A 03/18/2017   Procedure: EXPLORATORY LAPAROTOMY, SMALL BOWEL RESECTION;  Surgeon: Leafy Ro, MD;  Location: [facility] ORS;  Service: General;  Laterality: N/A;   TOTAL ABDOMINAL HYSTERECTOMY      Current Meds  Medication Sig   Cholecalciferol (VITAMIN D) 50 MCG (2000 UT) CAPS Take 1 capsule by mouth daily.   docusate sodium (COLACE) 100 MG capsule Take 200 mg by mouth daily.   fluticasone (FLONASE) 50 MCG/ACT nasal spray Use 2 spray(s) in each nostril once daily   furosemide (LASIX) 20 MG tablet Take 20mg  3 days per week PRN   lisinopril (ZESTRIL) 40 MG tablet Take 1 tablet (40 mg total) by mouth daily.   Multiple Vitamins-Minerals (ICAPS) CAPS Take 1 capsule by mouth 2 (two) times daily.    pantoprazole (PROTONIX) 40 MG tablet Take 1 tablet (40 mg total) by mouth daily.   Rivaroxaban (XARELTO) 15 MG TABS tablet TAKE 1 TABLET BY MOUTH ONCE DAILY WITH SUPPER    Allergies: Latex, Tetanus toxoid, Tramadol, and Alendronate sodium  Social History   Tobacco Use   Smoking status: Former   Smokeless tobacco: Never  Vaping Use   Vaping Use: Never used  Substance Use Topics   Alcohol use: No   Drug use: No  Family History  Problem Relation Age of Onset   Diabetes Mother    Cancer Father        jaw    Review of Systems: A 12-system review of systems was performed and was negative except as noted in the HPI.  --------------------------------------------------------------------------------------------------  Physical Exam: BP 130/80 (BP Location: Left Arm, Patient Position: Sitting, Cuff Size: Normal)   Pulse  78   Wt 119 lb (54 kg)   BMI 24.36 kg/m   General:  NAD.  Accompanied by her daughter. Neck: No JVD or HJR. Lungs: Clear to auscultation bilaterally without wheezes or crackles. Heart: Irregularly irregular rhythm with 1/6 systolic murmur. Abdomen: Soft, nontender, nondistended. Extremities: No lower extremity edema.  EKG: Atrial fibrillation with poor R wave progression.  Lab Results  Component Value Date   WBC 5.2 04/05/2021   HGB 11.7 (L) 04/05/2021   HCT 36.7 04/05/2021   MCV 79.6 (L) 04/05/2021   PLT 171 04/05/2021    Lab Results  Component Value Date   NA 136 04/05/2021   K 4.1 04/05/2021   CL 105 04/05/2021   CO2 26 04/05/2021   BUN 16 04/05/2021   CREATININE 0.92 04/05/2021   GLUCOSE 109 (H) 04/05/2021   ALT 9 04/02/2021    Lab Results  Component Value Date   CHOL 166 03/18/2020   HDL 63.10 03/18/2020   LDLCALC 86 03/18/2020   LDLDIRECT 101.2 05/20/2009   TRIG 88 10/03/2020   CHOLHDL 3 03/18/2020    --------------------------------------------------------------------------------------------------  ASSESSMENT AND PLAN: Permanent atrial fibrillation: Ventricular rate is normal today.  Preliminary review of event monitor notifications indicate only brief episodes of bradycardia, predominantly when asleep.  We will await final report from her event monitor, which she removed yesterday.  Continue to avoid rate controlling agents.  Continue indefinite anticoagulation with rivaroxaban 15 mg daily as long as falls or bleeding do not become a recurrent issue (creatinine clearance hovers around 50).  Hypertension: Blood pressure upper normal today.  Notably, metoprolol and amlodipine are on hold in the setting of recent hypotension and syncope associated with hospitalization for small bowel obstruction.  We will defer restarting these medications at this time.  Continue lisinopril 40 mg daily.  Edema: Minimal edema noted at this time.  Continue as needed  furosemide.  Follow-up: Return to clinic in 3 months.  Yvonne Kendall, MD 05/01/2021 2:45 PM

## 2021-04-30 NOTE — Patient Instructions (Signed)
Medication Instructions:  Your physician recommends that you continue on your current medications as directed. Please refer to the Current Medication list given to you today.  *If you need a refill on your cardiac medications before your next appointment, please call your pharmacy*   Lab Work: None ordered   If you have labs (blood work) drawn today and your tests are completely normal, you will receive your results only by: MyChart Message (if you have MyChart) OR A paper copy in the mail If you have any lab test that is abnormal or we need to change your treatment, we will call you to review the results.   Testing/Procedures: None ordered  Follow-Up: At [facility], you and your health needs are our priority.  As part of our continuing mission to provide you with exceptional heart care, we have created designated Provider Care Teams.  These Care Teams include your primary Cardiologist (physician) and Advanced Practice Providers (APPs -  Physician Assistants and Nurse Practitioners) who all work together to provide you with the care you need, when you need it.  We recommend signing up for the patient portal called "MyChart".  Sign up information is provided on this After Visit Summary.  MyChart is used to connect with patients for Virtual Visits (Telemedicine).  Patients are able to view lab/test results, encounter notes, upcoming appointments, etc.  Non-urgent messages can be sent to your provider as well.   To learn more about what you can do with MyChart, go to [email or web address].    Your next appointment:   3 month(s)  The format for your next appointment:   In Person  Provider:   You may see Christopher End, MD  or one of the following Advanced Practice Providers on your designated Care Team:   Christopher Berge, NP Ryan Dunn, PA-C Jacquelyn Visser, PA-C Cadence Furth, PA-C    

## 2021-05-01 ENCOUNTER — Encounter: Payer: Self-pay | Admitting: Internal Medicine

## 2021-05-01 DIAGNOSIS — R600 Localized edema: Secondary | ICD-10-CM | POA: Insufficient documentation

## 2021-05-06 NOTE — Telephone Encounter (Signed)
Order re-faxed as requested.  Spoke with [name] and he confirmed they received it.

## 2021-05-06 NOTE — Telephone Encounter (Signed)
Received a call from Rod Holler with [facility] They never received the notes that were faxed 04/28/21. Requesting these be refaxed to that same number. He also states that if you could call him once those are faxed just so that he can keep an eye out.  Phone [phone]

## 2021-05-13 ENCOUNTER — Telehealth: Payer: Self-pay | Admitting: Internal Medicine

## 2021-05-13 DIAGNOSIS — K6389 Other specified diseases of intestine: Secondary | ICD-10-CM | POA: Diagnosis not present

## 2021-05-13 DIAGNOSIS — Z7951 Long term (current) use of inhaled steroids: Secondary | ICD-10-CM | POA: Diagnosis not present

## 2021-05-13 DIAGNOSIS — I4821 Permanent atrial fibrillation: Secondary | ICD-10-CM | POA: Diagnosis not present

## 2021-05-13 DIAGNOSIS — Z7901 Long term (current) use of anticoagulants: Secondary | ICD-10-CM | POA: Diagnosis not present

## 2021-05-13 DIAGNOSIS — E46 Unspecified protein-calorie malnutrition: Secondary | ICD-10-CM | POA: Diagnosis not present

## 2021-05-13 DIAGNOSIS — Z9151 Personal history of suicidal behavior: Secondary | ICD-10-CM | POA: Diagnosis not present

## 2021-05-13 DIAGNOSIS — I10 Essential (primary) hypertension: Secondary | ICD-10-CM | POA: Diagnosis not present

## 2021-05-13 DIAGNOSIS — H3530 Unspecified macular degeneration: Secondary | ICD-10-CM | POA: Diagnosis not present

## 2021-05-13 DIAGNOSIS — M810 Age-related osteoporosis without current pathological fracture: Secondary | ICD-10-CM | POA: Diagnosis not present

## 2021-05-13 DIAGNOSIS — K56609 Unspecified intestinal obstruction, unspecified as to partial versus complete obstruction: Secondary | ICD-10-CM | POA: Diagnosis not present

## 2021-05-13 NOTE — Telephone Encounter (Signed)
Please call with monitor results °

## 2021-05-13 NOTE — Telephone Encounter (Signed)
Yvonne Kendall, MD  05/12/2021  7:56 AM EDT Back to Top    Please let Ms. Bednarz know that her event monitor showed continued atrial fibrillation with borderline low heart rates.  There were no excessive drops in her heart rate.  I recommend that she continue her current medications and follow-up as previously arranged.   The patient's daughter, [name], has been notified of the result and verbalized understanding. Ok per [employer]. All questions (if any) were answered.

## 2021-06-02 ENCOUNTER — Ambulatory Visit: Payer: PPO | Admitting: Podiatry

## 2021-06-02 ENCOUNTER — Encounter: Payer: Self-pay | Admitting: Podiatry

## 2021-06-02 ENCOUNTER — Other Ambulatory Visit: Payer: Self-pay | Admitting: Internal Medicine

## 2021-06-02 ENCOUNTER — Other Ambulatory Visit: Payer: Self-pay

## 2021-06-02 DIAGNOSIS — B351 Tinea unguium: Secondary | ICD-10-CM | POA: Diagnosis not present

## 2021-06-02 DIAGNOSIS — M79676 Pain in unspecified toe(s): Secondary | ICD-10-CM

## 2021-06-02 NOTE — Progress Notes (Signed)
She presents today chief complaint of painfully elongated toenails.  Objective: Pulses remain palpable toenails are long thick yellow dystrophic-like mycotic painful palpation.  Assessment: Pain limb secondary onychomycosis.  Plan: Debridement of toenails 1 through 5 bilateral.

## 2021-06-04 ENCOUNTER — Telehealth: Payer: Self-pay | Admitting: Internal Medicine

## 2021-06-04 NOTE — Telephone Encounter (Signed)
Pt's age 87, wt 54 kg, SCr 0.92 on 04/05/21, CrCl 31.87, last ov w/ Dr. Okey Dupre 04/30/21. Pt is on appropriate dosage of Xarelto 15 mg once daily.

## 2021-06-04 NOTE — Telephone Encounter (Signed)
Patient calling the office for samples of medication:   1.  What medication and dosage are you requesting samples for? Xarelto 15 MG  2.  Are you currently out of this medication? Will be soon - in the donut hole and would like possibly some samples so they do not have to pay for the 3 month supply.  Would like to discuss possible assistance.

## 2021-06-07 NOTE — Telephone Encounter (Signed)
Patient daughter calling to check status of samples

## 2021-06-08 NOTE — Telephone Encounter (Signed)
I have spoke with pt's daughter and she has expressed her mother's concerns. Pt is having issues being able to afford her Xarelto at this time. Pt is currently in a donut hole and is having to pay multiple hospital bills and increase in her Medicare and her xarelto cost has since doubled.  Pt is having a financial hardship at this time. I have recommended for pt to apply for patient assistance program. Pt's daughter mentioned that pt didn't qualify last yr but that she would be willing to reapply. I have placed samples upfront and also provided coupon cards to see if this may help until pt submit's her application.  Xarelto 15 mg  4 bottles  LOT: [id number] EXP: 12/2022

## 2021-06-09 ENCOUNTER — Other Ambulatory Visit: Payer: Self-pay | Admitting: Family Medicine

## 2021-06-11 ENCOUNTER — Ambulatory Visit: Payer: PPO

## 2021-06-18 ENCOUNTER — Other Ambulatory Visit: Payer: Self-pay

## 2021-06-18 ENCOUNTER — Ambulatory Visit (INDEPENDENT_AMBULATORY_CARE_PROVIDER_SITE_OTHER): Payer: PPO

## 2021-06-18 DIAGNOSIS — Z23 Encounter for immunization: Secondary | ICD-10-CM

## 2021-06-25 ENCOUNTER — Telehealth: Payer: Self-pay | Admitting: Internal Medicine

## 2021-06-25 DIAGNOSIS — R040 Epistaxis: Secondary | ICD-10-CM

## 2021-06-25 NOTE — Telephone Encounter (Signed)
Left message for patient to call back  

## 2021-06-25 NOTE — Telephone Encounter (Signed)
Patient daughter [name] calling  States that at 3am this morning patient woke up with a bloody nose [name] used bleedstop gauze and it took about 20 minutes to stop States this also happened about 3 weeks ago Would like to discuss with nurse Please call

## 2021-06-25 NOTE — Telephone Encounter (Signed)
Patient returning call.

## 2021-06-28 NOTE — Telephone Encounter (Signed)
Spoke with pt's daughter, [name] and notified of Dr. Serita Kyle recc.  [name] agrees to take pt to ENT to evaluate the cause of nose bleeds.  Referral placed to [facility].

## 2021-06-28 NOTE — Telephone Encounter (Signed)
I recommend that Paula Luna see an ENT specialist to evaluate cause of her nosebleeds.  If remains a recurrent problem, we will likely have to stop her rivaroxaban.  Yvonne Kendall, MD [facility]

## 2021-06-28 NOTE — Telephone Encounter (Signed)
Spoke with pt's daughter, [name].  Pt has had 3 nosebleeds in the past 3 weeks.  First was 3 weeks ago.  Second was Friday 10/14 and clotted after 20 minutes.  Last episode was yesterday 10/16 and clotted after 10 minutes using "bleedstop" gauze.   Denies any other s/s bleeding.  No bleeding seen on brief undergarment when changed or in toilet.  Pt continues Xarelto 15 mg daily.   Advised to use saline spray during this time of year with dry air to help prevent.  Continue Xarelto and I will make Dr. Okey Dupre aware for further recc.  [name] voiced understanding.

## 2021-06-28 NOTE — Telephone Encounter (Signed)
Patient daughter returning call States patient had another bloody nose this weekend Please call to discuss

## 2021-06-30 ENCOUNTER — Telehealth: Payer: Self-pay | Admitting: Family Medicine

## 2021-06-30 NOTE — Telephone Encounter (Signed)
Has something changed with Paula Luna?  Hospice is for people who are terminal and not expected to live more than 6 months.

## 2021-06-30 NOTE — Telephone Encounter (Signed)
Pt daughter called in requesting to get a referral to hospice for pt care .Marland Kitchen needs help to care for pt . Please Advise [phone]

## 2021-07-01 NOTE — Telephone Encounter (Signed)
I understand their very difficult position.  I do not think there are any easy answers.  I cannot involve hospice unless someone does have a terminal condition or they have a life-expectancy of less than 6 months.

## 2021-07-01 NOTE — Telephone Encounter (Signed)
Left message for Paula Luna that Dr. Patsy Lager says he understands their very difficult position. He doesn't think there are any easy answers and unfortunately he cannot involve hospice unless someone does have a terminal condition or they have a life-expectancy of less than 6 months.

## 2021-07-01 NOTE — Telephone Encounter (Signed)
Spoke with [name] and advised her that with a hospice referral, the patient has to have a 6 month or less life expectancy.  The only change in health is patient is having nose bleeds and they are waiting on an appointment for her to be evaluated for this with ENT.   [name] states she just needs some help taking care of her mom.  She has to work to make ends meet.  [name] states she is overwhelmed, exhausted and feels like she is falling about.  She has lost 25 lbs.  She has called around to try and find someone to come help take care of her mom while she works but it was too expensive.   She states if nothing can be done then she will continue to try and hang on.  Please advise.

## 2021-07-13 NOTE — Progress Notes (Signed)
Chronic Care Management Pharmacy Note  07/19/2021 Name:  Paula Luna MRN:  [id number] DOB:  [birth year]  Summary: Initial CCM visit. Chief concern - needs a sitter while daughter is work. She also needs assistance with medication cost. She is using [employer], maintenance for Xarelto, would likely not be eligible for PAP. She has difficulty affording Preservision as well. Discussed using store coupons. No other health concerns identified. HTN, GERD, and AFIB are stable, controlled.  Recommendations/Changes made from today's visit: Refer to CCM social worker for resources  Plan: CCM follow up 12 months (with [name]) or sooner as needed  Subjective: Paula Luna is an 87 y.o. year old Luna who is a primary patient of Copland, Frederico Hamman, MD.  The CCM team was consulted for assistance with disease management and care coordination needs.    Engaged with patient by telephone for initial visit in response to provider referral for pharmacy case management and/or care coordination services.   Consent to Services:  The patient was given the following information about Chronic Care Management services today, agreed to services, and gave verbal consent: 1. CCM service includes personalized support from designated clinical staff supervised by the primary care provider, including individualized plan of care and coordination with other care providers 2. 24/7 contact phone numbers for assistance for urgent and routine care needs. 3. Service will only be billed when office clinical staff spend 20 minutes or more in a month to coordinate care. 4. Only one practitioner may furnish and bill the service in a calendar month. 5.The patient may stop CCM services at any time (effective at the end of the month) by phone call to the office staff. 6. The patient will be responsible for cost sharing (co-pay) of up to 20% of the service fee (after annual deductible is met). Patient agreed to services and consent  obtained.  Patient Care Team: Owens Loffler, MD as PCP - General End, Harrell Gave, MD as PCP - Cardiology (Cardiology) Debbora Dus, [facility] as Pharmacist (Pharmacist)  Recent office visits: 04/14/21 - PCP - Patient presented for hospital follow up for bowel obstruction and burping with GERD.  Initiate Protonix 40 mg take 1 tablet daily. Home Health referral, follow up 3 months.   Recent consult visits:  06/02/21 - Podiatry - Patient presented for toe nail trimming, no medication changes  04/30/21 - Cardiology - Patient presented for follow up hospital course and syncope-  Notably, metoprolol and amlodipine are on hold in the setting of recent hypotension and syncope associated with hospitalization for small bowel obstruction.  We will defer restarting these medications at this time. Follow up 3 months 03/01/21 - Podiatry - Patient presented for toe nail trimming,no medication changes.   Hospital visits:  04/02/21 thru 04/06/21-[facility] hospital-Patient presented for abdominal pain CT-scan of the abdomen.Small bowel obstruction, subacute T12 fracture. Admission, NG tube placement, continue home medications, wear lumbar support while in house.   Objective:  Lab Results  Component Value Date   CREATININE 0.92 04/05/2021   BUN 16 04/05/2021   GFR 71.74 11/05/2020   GFRNONAA 58 (L) 04/05/2021   GFRAA >60 08/27/2019   NA 136 04/05/2021   K 4.1 04/05/2021   CALCIUM 8.6 (L) 04/05/2021   CO2 26 04/05/2021   GLUCOSE 109 (H) 04/05/2021    Lab Results  Component Value Date/Time   HGBA1C 5.8 02/18/2019 09:25 AM   HGBA1C 5.5 03/19/2017 04:17 AM   GFR 71.74 11/05/2020 02:22 PM   GFR 59.91 (L) 03/18/2020 10:07 AM  Lab Results  Component Value Date   CHOL 166 03/18/2020   HDL 63.10 03/18/2020   LDLCALC 86 03/18/2020   LDLDIRECT 101.2 05/20/2009   TRIG 88 10/03/2020   CHOLHDL 3 03/18/2020    Hepatic Function Latest Ref Rng & Units 04/02/2021 11/05/2020 10/08/2020  Total Protein 6.5 - 8.1  g/dL 7.3 6.4 -  Albumin 3.5 - 5.0 g/dL 4.0 3.6 2.7(L)  AST 15 - 41 U/L 27 17 -  ALT 0 - 44 U/L 9 15 -  Alk Phosphatase 38 - 126 U/L 92 83 -  Total Bilirubin 0.3 - 1.2 mg/dL 0.9 0.4 -  Bilirubin, Direct 0.0 - 0.3 mg/dL - 0.1 -    Lab Results  Component Value Date/Time   TSH 2.12 03/18/2020 10:07 AM   TSH 1.29 02/13/2018 12:54 PM    CBC Latest Ref Rng & Units 04/05/2021 04/04/2021 04/03/2021  WBC 4.0 - 10.5 K/uL 5.2 6.3 6.7  Hemoglobin 12.0 - 15.0 g/dL 11.7(L) 11.4(L) 12.9  Hematocrit 36.0 - 46.0 % 36.7 35.8(L) 39.7  Platelets 150 - 400 K/uL 171 167 147(L)    Lab Results  Component Value Date/Time   VD25OH 52.40 03/18/2020 10:07 AM   VD25OH 41.08 02/18/2019 09:25 AM    Clinical ASCVD: No  The ASCVD Risk score (Arnett DK, et al., 2019) failed to calculate for the following reasons:   The 2019 ASCVD risk score is only valid for ages 53 to 38    Depression screen PHQ 2/9 03/25/2020 02/18/2019 02/13/2018  Decreased Interest 0 0 0  Down, Depressed, Hopeless 0 0 0  PHQ - 2 Score 0 0 0  Altered sleeping - 0 0  Tired, decreased energy - 0 0  Change in appetite - 0 0  Feeling bad or failure about yourself  - 0 0  Trouble concentrating - 0 0  Moving slowly or fidgety/restless - 0 0  Suicidal thoughts - 0 0  PHQ-9 Score - 0 0  Difficult doing work/chores - Not difficult at all Not difficult at all  Some recent data might be hidden    Social History   Tobacco Use  Smoking Status Former  Smokeless Tobacco Never   BP Readings from Last 3 Encounters:  04/30/21 130/80  04/14/21 112/70  04/06/21 (!) 137/54   Pulse Readings from Last 3 Encounters:  04/30/21 78  04/14/21 60  04/06/21 60   Wt Readings from Last 3 Encounters:  04/30/21 119 lb (54 kg)  04/14/21 117 lb 8 oz (53.3 kg)  04/03/21 123 lb 14.4 oz (56.2 kg)   BMI Readings from Last 3 Encounters:  04/30/21 24.36 kg/m  04/14/21 24.06 kg/m  04/03/21 25.02 kg/m    Assessment/Interventions: Review of patient past  medical history, allergies, medications, health status, including review of consultants reports, laboratory and other test data, was performed as part of comprehensive evaluation and provision of chronic care management services.   SDOH:  (Social Determinants of Health) assessments and interventions performed: Yes SDOH Interventions    Flowsheet Row Most Recent Value  SDOH Interventions   Financial Strain Interventions Intervention Not Indicated      SDOH Screenings   Alcohol Screen: Not on file  Depression (OHF2-9): Not on file  Financial Resource Strain: Medium Risk   Difficulty of Paying Living Expenses: Somewhat hard  Food Insecurity: Not on file  Housing: Not on file  Physical Activity: Not on file  Social Connections: Not on file  Stress: Not on file  Tobacco Use: Medium Risk  Smoking Tobacco Use: Former   Smokeless Tobacco Use: Never   Passive Exposure: Not on file  Transportation Needs: Not on file    [place]  Allergies  Allergen Reactions   Latex Rash   Tetanus Toxoid Anaphylaxis    REACTION: anaphylaxis   Tramadol Other (See Comments)    Caused severe constipation---bowel obstruction   Alendronate Sodium     REACTION: passed out    Medications Reviewed Today     Reviewed by Debbora Dus, [facility] (Pharmacist) on 07/19/21 at 0854  Med List Status: <None>   Medication Order Taking? Sig Documenting Provider Last Dose Status Informant  Cholecalciferol (VITAMIN D) 50 MCG (2000 UT) CAPS [id number] Yes Take 1 capsule by mouth daily. [provider] Taking Active Child  docusate sodium (COLACE) 100 MG capsule [id number] Yes Take 100 mg by mouth daily. [provider] Taking Active Child  fluticasone (FLONASE) 50 MCG/ACT nasal spray [id number] No Use 2 spray(s) in each nostril once daily  Patient not taking: Reported on 07/19/2021   Copland, Frederico Hamman, MD Not Taking Active   furosemide (LASIX) 20 MG tablet [id number] Yes Take 54m 3 days per week  PRN [provider] Taking Active   lisinopril (ZESTRIL) 40 MG tablet [id number] Take 1 tablet by mouth once daily End, Christopher, MD Taking Active   Multiple Vitamins-Minerals (ICAPS) CAPS [id number] Take 1 capsule by mouth 2 (two) times daily.  [provider] Taking Active Child  pantoprazole (PROTONIX) 40 MG tablet [id number] Take 1 tablet (40 mg total) by mouth daily. 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[place]    Macular degeneration of left eye 01/06/2015   Essential hypertension 12/25/2013    Immunization History  Administered Date(s) Administered   Fluad Quad(high Dose 65+) 06/15/2020, 06/18/2021   Influenza Split 07/12/2012, 06/28/2013   Influenza Whole 08/20/2007, 05/10/2010   Influenza, High Dose Seasonal PF 08/14/2014, 07/29/2015, 05/22/2016, 06/10/2017, 06/20/2018   Influenza,inj,Quad PF,6+ Mos 05/07/2019   PFIZER(Purple Top)SARS-COV-2 Vaccination 11/29/2019, 12/20/2019   Pneumococcal Conjugate-13 12/25/2013   Pneumococcal Polysaccharide-23 05/10/2010   Zoster Recombinat (Shingrix) 03/06/2018   Zoster, Live 10/15/2012    Conditions to be addressed/monitored:  Hypertension and Atrial Fibrillation  [place] Updates made by ADebbora Dus RRamonasince 07/19/2021 12:00 AM     Problem: CHL AMB "PATIENT-SPECIFIC PROBLEM"      Long-Range Goal: CB[place]  Start Date: 07/19/2021  Priority: High  Note:   Current Barriers:  Caregiver burden - needs assistance while daughter is at work  Pharmacist Clinical  Goal(s):  Patient will contact provider office for questions/concerns as evidenced notation of same in electronic health record through collaboration with PharmD and provider.   Interventions: 1:1 collaboration with COwens Loffler MD regarding development and update of comprehensive plan of care as evidenced by provider attestation and co-signature Inter-disciplinary care team collaboration (see longitudinal plan of care) Comprehensive medication review performed; medication list updated in electronic medical record  Hypertension (BP goal <140/90) -Controlled -  clinic readings within goal -Current treatment: Lisinopril 40 mg - 1 tablet daily -Medications previously tried: amlodipine (stopped in hospital) -Current home readings: does not check at home, in past monitors were inaccurate per daughter report. She would like to be able to check at home on occasion. -Current dietary habits: great appetite, patient's daughter prepares meals; Eats yogurt, fruits, applesauce, Ensure, crackers in addition to meals. -Current exercise habits: patient walks 6-7 laps around house each day  -Denies hypotensive/hypertensive symptoms; No falls or imbalance. Patient is very careful. She has had 2 falls in the past year. -Educated on Symptoms of hypotension and importance of maintaining adequate hydration; -We discussed, she may bring BP monitor to office to verify accuracy. She will take to upcoming cardiology appt. -Recommended to continue current medication  Atrial Fibrillation (Goal: prevent stroke and major bleeding) -Managed by cardiology -Pt's daughter reports nose bleeds have improved. She has an upcoming appt with cardiology to discuss continuation of Xarelto. We discussed Xarelto cost concern. They get Xarelto samples for part of the year from Cardiology, then fill with [employer] program. She would have to spend more OOP to meet criteria for Xarelto assistance program. I suggest they continue  with current options.  -CHADSVASC: 4  -Current treatment: Rate control: None Anticoagulation: Xarelto 15 mg - 1 tablet daily with supper -Medications previously tried: none -Home BP and HR readings: none   -Counseled on importance of adherence to anticoagulant exactly as prescribed; avoidance of NSAIDs due to increased bleeding risk with anticoagulants; She only takes Tylenol. -Recommended to continue current medication  GERD -Controlled - per daughter report, belching has improved  -Current treatment: Pantoprazole 40 mg - 1 tablet daily -Medications previously tried: none reported -Recommended to continue current medication  Other - takes furosemide 20 mg if ankle swelling, less than once weekly  Other/OTCs Vitamin D3 - 1 in evening Stool softener - 1 each morning Preservision - 1 in morning and 1 in afternoon Systane eye relief - 1 once daily She stopped Flonase due to runny nose. Tylenol - rare PRN  Patient Goals/Self-Care Activities Patient will:  - take medications as prescribed as evidenced by patient report and record review - contact CCM team with any health concerns  Follow Up Plan: Telephone follow up appointment with care management team member scheduled for: 12 months - Will contact SW regarding daughter's concerns for a sitter      Medication Assistance:  Reports concern about Xarelto cost, usually purchases 2-4 months out of year and gets samples the rest of the year.  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 The Preservision is also expensive. We discussed limited options for OTC Preservision. Coupons may be available. Unlikely eligible for Xarelto assistance due to her OOP spent.  Compliance/Adherence/Medication fill history: Care Gaps: None  Star-Rating Drugs: Medication:                Last Fill:         Day Supply Lisinopril 46m            07/11/21          90  [facility]  [facility]  [facility] [place]Phone: [phone]: [address]Phone: [phone]: [phone] Uses pill box? Yes - daughter  fills pillbox for breakfast, dinner time, and evening. Pt endorses 100% compliance  She is doing well with current pharmacy at this time. She gets her OTC medications from [place] due to cost.  Care Plan and Follow Up Patient Decision:  Patient agrees to Care Plan and Follow-up.  Debbora Dus, PharmD Clinical Pharmacist [facility] at [facility] [phone]

## 2021-07-14 ENCOUNTER — Telehealth: Payer: Self-pay

## 2021-07-14 DIAGNOSIS — H02831 Dermatochalasis of right upper eyelid: Secondary | ICD-10-CM | POA: Diagnosis not present

## 2021-07-14 DIAGNOSIS — H524 Presbyopia: Secondary | ICD-10-CM | POA: Diagnosis not present

## 2021-07-14 DIAGNOSIS — H43393 Other vitreous opacities, bilateral: Secondary | ICD-10-CM | POA: Diagnosis not present

## 2021-07-14 DIAGNOSIS — H0100A Unspecified blepharitis right eye, upper and lower eyelids: Secondary | ICD-10-CM | POA: Diagnosis not present

## 2021-07-14 DIAGNOSIS — H353112 Nonexudative age-related macular degeneration, right eye, intermediate dry stage: Secondary | ICD-10-CM | POA: Diagnosis not present

## 2021-07-14 DIAGNOSIS — H02834 Dermatochalasis of left upper eyelid: Secondary | ICD-10-CM | POA: Diagnosis not present

## 2021-07-14 DIAGNOSIS — H0288B Meibomian gland dysfunction left eye, upper and lower eyelids: Secondary | ICD-10-CM | POA: Diagnosis not present

## 2021-07-14 DIAGNOSIS — H52201 Unspecified astigmatism, right eye: Secondary | ICD-10-CM | POA: Diagnosis not present

## 2021-07-14 DIAGNOSIS — H11823 Conjunctivochalasis, bilateral: Secondary | ICD-10-CM | POA: Diagnosis not present

## 2021-07-14 DIAGNOSIS — H0288A Meibomian gland dysfunction right eye, upper and lower eyelids: Secondary | ICD-10-CM | POA: Diagnosis not present

## 2021-07-14 DIAGNOSIS — H353124 Nonexudative age-related macular degeneration, left eye, advanced atrophic with subfoveal involvement: Secondary | ICD-10-CM | POA: Diagnosis not present

## 2021-07-14 DIAGNOSIS — H35033 Hypertensive retinopathy, bilateral: Secondary | ICD-10-CM | POA: Diagnosis not present

## 2021-07-14 DIAGNOSIS — H0100B Unspecified blepharitis left eye, upper and lower eyelids: Secondary | ICD-10-CM | POA: Diagnosis not present

## 2021-07-14 NOTE — Chronic Care Management (AMB) (Addendum)
Chronic Care Management Pharmacy Assistant   Name: HEDAYA LATENDRESSE  MRN: [id number] DOB: 12/02/1924  Paula Luna is an 87 y.o. year old female who presents for her initial CCM visit with the clinical pharmacist.  Reason for Encounter: Initial Questions  Conditions to be addressed/monitored: HTN  Recent office visits:  04/14/21-PCP-Patient presented for hospital follow up for bowel obstruction and burping with GERD.  Initiate Protonix 40 mg take 1 tablet daily. Home Health referral, follow up 3 months.  Recent consult visits:  06/02/21-Podiatry-Patient presented for toe nail trimming, no medication changes  04/30/21-Cardiology- Patient presented for follow up hospital course and syncope-  Notably, metoprolol and amlodipine are on hold in the setting of recent hypotension and syncope associated with hospitalization for small bowel obstruction.  We will defer restarting these medications at this time. Follow up 3 months 03/01/21-Podiatry-Patient presented for toe nail trimming,no medication changes.  Hospital visits:  04/02/21 thru 04/06/21-[facility] hospital-Patient presented for abdominal pain CT-scan of the abdomen.Small bowel obstruction,subacute T12 fracture.Admission,NG tube placement, continue home medications, wear lumbar support while in house.  Medications: Outpatient Encounter Medications as of 07/14/2021  Medication Sig   Cholecalciferol (VITAMIN D) 50 MCG (2000 UT) CAPS Take 1 capsule by mouth daily.   docusate sodium (COLACE) 100 MG capsule Take 200 mg by mouth daily.   fluticasone (FLONASE) 50 MCG/ACT nasal spray Use 2 spray(s) in each nostril once daily   furosemide (LASIX) 20 MG tablet Take 20mg  3 days per week PRN   lisinopril (ZESTRIL) 40 MG tablet Take 1 tablet by mouth once daily   Multiple Vitamins-Minerals (ICAPS) CAPS Take 1 capsule by mouth 2 (two) times daily.    pantoprazole (PROTONIX) 40 MG tablet Take 1 tablet (40 mg total) by mouth daily.   Rivaroxaban  (XARELTO) 15 MG TABS tablet TAKE 1 TABLET BY MOUTH ONCE DAILY WITH SUPPER   No facility-administered encounter medications on file as of 07/14/2021.    Lab Results  Component Value Date/Time   HGBA1C 5.8 02/18/2019 09:25 AM   HGBA1C 5.5 03/19/2017 04:17 AM     BP Readings from Last 3 Encounters:  04/30/21 130/80  04/14/21 112/70  04/06/21 (!) 137/54    Patient contacted to review initial questions prior to visit with 04/08/21.  Have you seen any other providers since your last visit with PCP? Yes Podiatry and Cardiology  Any changes in your medications or health? Yes The daughter reports Vernisha experienced 4 nosebleeds in 2 weeks  Any side effects from any medications? No  Do you have an symptoms or problems not managed by your medications? No  Any concerns about your health right now? Yes  Has your provider asked that you check blood pressure, blood sugar, or follow special diet at home? No  Do you get any type of exercise on a regular basis? No  Can you think of a goal you would like to reach for your health? Yes The patient is planning on going to ENT end of month.  Do you have any problems getting your medications? No  The patient reports the Xarelto is expensve.The Cardiologist believes this is the best blood thinner for the patient.  Is there anything that you would like to discuss during the appointment? No   Spoke with patient and reminded them to have all medications, supplements and any blood glucose and blood pressure readings available for review with pharmacist, at their telephone visit on 07/19/21 at 8:30am  The Daughter 85/7/22 will be  speaking on the patients behalf.   Star Rating Drugs:  Medication:  Last Fill: Day Supply Lisinopril 40mg  07/11/21 90  Care Gaps: Annual wellness visit in last year? No Most Recent BP reading:130/80  78-P 07/13/21, CPP notified  Phil Dopp, [facility] Clincal Pharmacy Assistant [phone]  I have reviewed the  care management and care coordination activities outlined in this encounter and I am certifying that I agree with the content of this note. No further action required.  [phone], PharmD Clinical Pharmacist [facility] at [facility] [phone]

## 2021-07-19 ENCOUNTER — Other Ambulatory Visit: Payer: Self-pay

## 2021-07-19 ENCOUNTER — Ambulatory Visit (INDEPENDENT_AMBULATORY_CARE_PROVIDER_SITE_OTHER): Payer: PPO

## 2021-07-19 DIAGNOSIS — I4821 Permanent atrial fibrillation: Secondary | ICD-10-CM

## 2021-07-19 DIAGNOSIS — R54 Age-related physical debility: Secondary | ICD-10-CM

## 2021-07-19 DIAGNOSIS — I10 Essential (primary) hypertension: Secondary | ICD-10-CM

## 2021-07-19 NOTE — Patient Instructions (Addendum)
July 19, 2021  Dear Paula Luna,  It was a pleasure meeting you during our initial appointment on July 19, 2021. Below is a summary of the goals we discussed and components of chronic care management. Please contact me anytime with questions or concerns.   Visit Information   Patient Care Plan: CCM Pharmacy Care Plan     Problem Identified: CHL AMB "PATIENT-SPECIFIC PROBLEM"      Long-Range Goal: CCM Pharmacy Care Plan   Start Date: 07/19/2021  Priority: High  Note:   Current Barriers:  Caregiver burden - needs assistance while daughter is at work  Pharmacist Clinical Goal(s):  Patient will contact provider office for questions/concerns as evidenced notation of same in electronic health record through collaboration with PharmD and provider.   Interventions: 1:1 collaboration with Copland, Frederico Hamman, MD regarding development and update of comprehensive plan of care as evidenced by provider attestation and co-signature Inter-disciplinary care team collaboration (see longitudinal plan of care) Comprehensive medication review performed; medication list updated in electronic medical record  Hypertension (BP goal <140/90) -Controlled - clinic readings within goal -Current treatment: Lisinopril 40 mg - 1 tablet daily -Medications previously tried: amlodipine (stopped in hospital) -Current home readings: does not check at home, in past monitors were inaccurate per daughter report. She would like to be able to check at home on occasion. -Current dietary habits: great appetite, patient's daughter prepares meals; Eats yogurt, fruits, applesauce, Ensure, crackers in addition to meals. -Current exercise habits: patient walks 6-7 laps around house each day  -Denies hypotensive/hypertensive symptoms; No falls or imbalance. Patient is very careful. She has had 2 falls in the past year. -Educated on Symptoms of hypotension and importance of maintaining adequate hydration; -We  discussed, she may bring BP monitor to office to verify accuracy. She will take to upcoming cardiology appt. -Recommended to continue current medication  Atrial Fibrillation (Goal: prevent stroke and major bleeding) -Managed by cardiology -Pt's daughter reports nose bleeds have improved. She has an upcoming appt with cardiology to discuss continuation of Xarelto. We discussed Xarelto cost concern. They get Xarelto samples for part of the year from Cardiology, then fill with [employer] program. She would have to spend more OOP to meet criteria for Xarelto assistance program. I suggest they continue with current options.  -CHADSVASC: 4  -Current treatment: Rate control: None Anticoagulation: Xarelto 15 mg - 1 tablet daily with supper -Medications previously tried: none -Home BP and HR readings: none   -Counseled on importance of adherence to anticoagulant exactly as prescribed; avoidance of NSAIDs due to increased bleeding risk with anticoagulants; She only takes Tylenol. -Recommended to continue current medication  GERD -Controlled - per daughter report, belching has improved  -Current treatment: Pantoprazole 40 mg - 1 tablet daily -Medications previously tried: none reported -Recommended to continue current medication  Other - takes furosemide 20 mg if ankle swelling, less than once weekly  Other/OTCs Vitamin D3 - 1 in evening Stool softener - 1 each morning Preservision - 1 in morning and 1 in afternoon Systane eye relief - 1 once daily She stopped Flonase due to runny nose. Tylenol - rare PRN  Patient Goals/Self-Care Activities Patient will:  - take medications as prescribed as evidenced by patient report and record review - contact CCM team with any health concerns  Follow Up Plan: Telephone follow up appointment with care management team member scheduled for: 12 months - Will contact SW regarding daughter's concerns for a sitter     [name] was  given  information about Chronic Care Management services today including:  CCM service includes personalized support from designated clinical staff supervised by her physician, including individualized plan of care and coordination with other care providers 24/7 contact phone numbers for assistance for urgent and routine care needs. Standard insurance, coinsurance, copays and deductibles apply for chronic care management only during months in which we provide at least 20 minutes of these services. Most insurances cover these services at 100%, however patients may be responsible for any copay, coinsurance and/or deductible if applicable. This service may help you avoid the need for more expensive face-to-face services. Only one practitioner may furnish and bill the service in a calendar month. The patient may stop CCM services at any time (effective at the end of the month) by phone call to the office staff.  Patient agreed to services and verbal consent obtained.   Patient verbalizes understanding of instructions provided today and agrees to view in MyChart.   Phil Dopp, PharmD Clinical Pharmacist [facility] at [facility] [phone]

## 2021-07-20 ENCOUNTER — Telehealth: Payer: Self-pay | Admitting: *Deleted

## 2021-07-20 NOTE — Chronic Care Management (AMB) (Signed)
  Chronic Care Management   Note  07/20/2021 Name: Paula Luna MRN: [id number] DOB: December 21, 1924  Paula Luna is a 87 y.o. year old female who is a primary care patient of Copland, Karleen Hampshire, MD. Paula Luna is currently enrolled in care management services. An additional referral for Licensed Clinical SW was placed.   Follow up plan: Telephone appointment with care management team member scheduled for: 07/28/2021  Burman Nieves, CCMA Care Guide, Embedded Care Coordination [facility]  Care Management  Direct Dial: [phone]

## 2021-07-28 ENCOUNTER — Ambulatory Visit: Payer: PPO | Admitting: *Deleted

## 2021-07-28 DIAGNOSIS — R54 Age-related physical debility: Secondary | ICD-10-CM

## 2021-07-28 DIAGNOSIS — I4821 Permanent atrial fibrillation: Secondary | ICD-10-CM

## 2021-07-28 NOTE — Chronic Care Management (AMB) (Signed)
Chronic Care Management    Clinical Social Work Note  07/28/2021 Name: NICHOLLE FALZON MRN: [id number] DOB: 1925/08/05  [name] Linn is a 87 y.o. year old female who is a primary care patient of Copland, Karleen Hampshire, MD. The CCM team was consulted to assist the patient with chronic disease management and/or care coordination needs related to: [employer] , Caregiver Stress, and Financial Difficulties related to limited income .   Engaged with patient by telephone for initial visit in response to provider referral for social work chronic care management and care coordination services.   Consent to Services:  The patient was given information about Chronic Care Management services, agreed to services, and gave verbal consent prior to initiation of services.  Please see initial visit note for detailed documentation.   Patient agreed to services and consent obtained.   Assessment: Review of patient past medical history, allergies, medications, and health status, including review of relevant consultants reports was performed today as part of a comprehensive evaluation and provision of chronic care management and care coordination services.     SDOH (Social Determinants of Health) assessments and interventions performed:  SDOH Interventions    Flowsheet Row Most Recent Value  SDOH Interventions   Food Insecurity Interventions Intervention Not Indicated  Transportation Interventions Intervention Not Indicated        Advanced Directives Status: See Care Plan for related entries.  CCM Care Plan  Allergies  Allergen Reactions   Latex Rash   Tetanus Toxoid Anaphylaxis    REACTION: anaphylaxis   Tramadol Other (See Comments)    Caused severe constipation---bowel obstruction   Alendronate Sodium     REACTION: passed out    Outpatient Encounter Medications as of 07/28/2021  Medication Sig   Cholecalciferol (VITAMIN D) 50 MCG (2000 UT) CAPS Take 1 capsule by mouth daily.    docusate sodium (COLACE) 100 MG capsule Take 100 mg by mouth daily.   fluticasone (FLONASE) 50 MCG/ACT nasal spray Use 2 spray(s) in each nostril once daily (Patient not taking: Reported on 07/19/2021)   furosemide (LASIX) 20 MG tablet Take 20mg  3 days per week PRN   lisinopril (ZESTRIL) 40 MG tablet Take 1 tablet by mouth once daily   Multiple Vitamins-Minerals (ICAPS) CAPS Take 1 capsule by mouth 2 (two) times daily.    pantoprazole (PROTONIX) 40 MG tablet Take 1 tablet (40 mg total) by mouth daily.   Rivaroxaban (XARELTO) 15 MG TABS tablet TAKE 1 TABLET BY MOUTH ONCE DAILY WITH SUPPER   No facility-administered encounter medications on file as of 07/28/2021.    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   Ischemic necrosis of small bowel (HCC)    Macular degeneration of left eye 01/06/2015   Essential hypertension 12/25/2013    Conditions to be addressed/monitored: Atrial Fibrillation and caregiver stress ; Financial constraints related to limited income, ADL IADL limitations, and Limited access to caregiver  Care Plan : LCSW Plan of Care  Updates made by 12/27/2013, LCSW since 07/28/2021 12:00 AM     Problem: Lack of resources and support for optimized safety and Quality of Life   Priority: High     Long-Range Goal: Improve overall safety, quality of life through support and resources   Start Date: 07/28/2021  Expected End Date: 10/11/2021  This Visit's Progress: On  track  Priority: High  Note:   Current barriers:    Financial constraints related to limited income, ADL IADL limitations, and Limited access to caregiver Clinical Goals: Patient will work with CSW to address needs related to improving care, safety and support Clinical Interventions:   CSW spoke with pt's HCPOA/daughter, [name], who pt  lives with . Per daughter, she is overwhelmed with the caregiving and overall needs related to caring for pt at home alone.  CSW inquired about pt's income and based on info provided pt will be eligible for Medicaid. Daughter to apply at [place]- advised daughter what to take, etc.  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  If/when pt gets approved for Medicaid she will be able to also apply for the [facility] program for personal care services in the home.    Assessment of needs, barriers , agencies contacted, as well as how impacting  Solution-Focused Strategies  Active listening / Reflection utilized  Emotional Support Provided Problem [place] strategies reviewed Caregiver stress acknowledged  Review various resources, discussed options and provided patient information about  Department of Orthoptist ( apply for [employer] and Physicist, medical ) [place] (PCS) 1:1 collaboration with primary care provider regarding development and update of comprehensive plan of care as evidenced by provider attestation and co-signature Inter-disciplinary care team collaboration (see longitudinal plan of care) Patient Goals/Self-Care Activities: Over the next 60 days Go to  Department of Social Services [phone] to apply for Medicaid and food stamps        Follow Up Plan: Appointment scheduled for SW follow up with client by phone on: 08/31/21      Eduard Clos MSW, [place] Licensed Clinical Social Worker [place] [phone]

## 2021-07-28 NOTE — Patient Instructions (Signed)
Visit Information   PATIENT GOALS/PLAN OF CARE:  Care Plan : LCSW Plan of Care  Updates made by Deirdre Peer, LCSW since 07/28/2021 12:00 AM     Problem: Lack of resources and support for optimized safety and Quality of Life   Priority: High     Long-Range Goal: Improve overall safety, quality of life through support and resources   Start Date: 07/28/2021  Expected End Date: 10/11/2021  This Visit's Progress: On track  Priority: High  Note:   Current barriers:    Financial constraints related to limited income, ADL IADL limitations, and Limited access to caregiver Clinical Goals: Patient will work with CSW to address needs related to improving care, safety and support Clinical Interventions:   CSW spoke with pt's HCPOA/daughter, Paula Luna, who pt lives with . Per daughter, she is overwhelmed with the caregiving and overall needs related to caring for pt at home alone.  CSW inquired about pt's income and based on info provided pt will be eligible for Medicaid. Daughter to apply at [place]- advised daughter what to take, etc.  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  If/when pt gets approved for Medicaid she will be able to also apply for the [facility] program for personal care services in the home.    Assessment of needs, barriers , agencies contacted, as well as how impacting  Solution-Focused Strategies  Active listening / Reflection utilized  Emotional Support Provided Problem [place] strategies reviewed Caregiver stress acknowledged  Review various resources, discussed options and provided patient information about  Department of Orthoptist ( apply for [employer] and Physicist, medical ) [place] (PCS) 1:1 collaboration with primary care provider regarding development and update of comprehensive plan of care as evidenced by provider attestation and  co-signature Inter-disciplinary care team collaboration (see longitudinal plan of care) Patient Goals/Self-Care Activities: Over the next 60 days Go to  Department of Social Services [phone] to apply for Medicaid and food stamps       Consent to CCM Services: Paula Luna was given information about Chronic Care Management services including:  CCM service includes personalized support from designated clinical staff supervised by her physician, including individualized plan of care and coordination with other care providers 24/7 contact phone numbers for assistance for urgent and routine care needs. Service will only be billed when office clinical staff spend 20 minutes or more in a month to coordinate care. Only one practitioner may furnish and bill the service in a calendar month. The patient may stop CCM services at any time (effective at the end of the month) by phone call to the office staff. The patient will be responsible for cost sharing (co-pay) of up to 20% of the service fee (after annual deductible is met).  Patient agreed to services and verbal consent obtained.   Patient verbalizes understanding of instructions provided today and agrees to view in [place].   Telephone follow up appointment with care management team member scheduled for:08/31/21 Eduard Clos MSW, LCSW Licensed Clinical Social Worker [place] [phone]

## 2021-07-29 ENCOUNTER — Ambulatory Visit (INDEPENDENT_AMBULATORY_CARE_PROVIDER_SITE_OTHER): Payer: PPO | Admitting: Internal Medicine

## 2021-07-29 ENCOUNTER — Other Ambulatory Visit: Payer: Self-pay

## 2021-07-29 ENCOUNTER — Encounter: Payer: Self-pay | Admitting: Internal Medicine

## 2021-07-29 VITALS — BP 144/90 | HR 71 | Ht <= 58 in | Wt 122.0 lb

## 2021-07-29 DIAGNOSIS — R600 Localized edema: Secondary | ICD-10-CM

## 2021-07-29 DIAGNOSIS — R55 Syncope and collapse: Secondary | ICD-10-CM

## 2021-07-29 DIAGNOSIS — I10 Essential (primary) hypertension: Secondary | ICD-10-CM

## 2021-07-29 DIAGNOSIS — I4821 Permanent atrial fibrillation: Secondary | ICD-10-CM | POA: Diagnosis not present

## 2021-07-29 NOTE — Progress Notes (Signed)
Follow-up Outpatient Visit Date: 07/29/2021  Primary Care Provider: Hannah Beat, MD [address] [place] [address]  Chief Complaint: Follow-up atrial fibrillation and syncope  HPI:  Paula Luna is a 87 y.o. female with history of permanent atrial fibrillation, hypertension, ischemic bowel status post bowel resection with recent hospitalization for SBO, osteoporosis, and anemia, who presents for follow-up of atrial fibrillation and syncope.  I last saw her in August following hospitalization for small bowel obstruction complicated by syncope.  Event monitor showed atrial fibrillation with borderline slow ventricular response though no prolonged pauses were observed.  A single 4 beat run of NSVT was also noted.  Today, Paula Luna reports that she is feeling fairly well.  She has not had any further syncope but is concerned that recurrent bowel obstruction and/or constipation could precipitate this.  Her bowels have been regular recently.  She notes some lightheadedness when she walks for extended periods.  She denies chest pain, shortness of breath, and palpitations.  She has minimal ankle edema.  She has had 4 episodes of epistaxis and is scheduled for ENT evaluation in the next few weeks.  She remains compliant with rivaroxaban.  --------------------------------------------------------------------------------------------------  Past Medical History:  Diagnosis Date   Hypertension 12/25/2013   Ischemic bowel disease (HCC)    a. 03/2017 abd pain-->internal herina and ischemic jejunum s/p ex lap and small bowel resection.   Macular degeneration of left eye 01/06/2015   Osteoporosis    Permanent atrial fibrillation (HCC)    a. First noted 03/2017;  b. 07/2017 Echo: EF 60-65%, no rwma, mild MR, mildly to mod dil LA; c. 07/2017 48hr Holter: persistent Afib, avg HR of 66 (range 39-140 bpm);  d. CHA2DS2VASc = 4-->Xarelto initiated 07/2017.   Past Surgical History:  Procedure  Laterality Date   CLOSED REDUCTION PROXIMAL HUMERUS FRACTURE Left 2017   LAPAROSCOPIC SMALL BOWEL RESECTION     LAPAROTOMY N/A 03/18/2017   Procedure: EXPLORATORY LAPAROTOMY, SMALL BOWEL RESECTION;  Surgeon: Leafy Ro, MD;  Location: [facility] ORS;  Service: General;  Laterality: N/A;   TOTAL ABDOMINAL HYSTERECTOMY      Current Meds  Medication Sig   Cholecalciferol (VITAMIN D) 50 MCG (2000 UT) CAPS Take 1 capsule by mouth daily.   docusate sodium (COLACE) 100 MG capsule Take 100 mg by mouth daily.   fluticasone (FLONASE) 50 MCG/ACT nasal spray Place 2 sprays into both nostrils daily as needed for allergies or rhinitis.   furosemide (LASIX) 20 MG tablet Take 20mg  3 days per week PRN   lisinopril (ZESTRIL) 40 MG tablet Take 1 tablet by mouth once daily   Multiple Vitamins-Minerals (ICAPS) CAPS Take 1 capsule by mouth 2 (two) times daily.    pantoprazole (PROTONIX) 40 MG tablet Take 1 tablet (40 mg total) by mouth daily.   Rivaroxaban (XARELTO) 15 MG TABS tablet TAKE 1 TABLET BY MOUTH ONCE DAILY WITH SUPPER    Allergies: Latex, Tetanus toxoid, Tramadol, and Alendronate sodium  Social History   Tobacco Use   Smoking status: Former   Smokeless tobacco: Never  Use: Never used  Substance Use Topics   Alcohol use: No   Drug use: No    Family History  Problem Relation Age of Onset   Diabetes Mother    Cancer Father        jaw    Review of Systems: A 12-system review of systems was performed and was negative except as noted in the HPI.  --------------------------------------------------------------------------------------------------  Physical Exam: BP (!) 144/90 (BP Location: Left Arm, Patient Position: Sitting, Cuff Size: Normal)   Pulse 71   Ht 4\' 10"  (1.473 m)   Wt 122 lb (55.3 kg)   BMI 25.50 kg/m   General:  NAD. Neck: No JVD or HJR. Lungs: Clear to auscultation bilaterally without wheezes or crackles. Heart: Irregularly irregular rhythm without  murmurs. Abdomen: Soft, nontender, nondistended. Extremities: No lower extremity edema.  EKG: Atrial fibrillation (ventricular rate 71 bpm).  No significant change from prior tracing on 04/30/2021.  Lab Results  Component Value Date   WBC 5.2 04/05/2021   HGB 11.7 (L) 04/05/2021   HCT 36.7 04/05/2021   MCV 79.6 (L) 04/05/2021   PLT 171 04/05/2021    Lab Results  Component Value Date   NA 136 04/05/2021   K 4.1 04/05/2021   CL 105 04/05/2021   CO2 26 04/05/2021   BUN 16 04/05/2021   CREATININE 0.92 04/05/2021   GLUCOSE 109 (H) 04/05/2021   ALT 9 04/02/2021    Lab Results  Component Value Date   CHOL 166 03/18/2020   HDL 63.10 03/18/2020   LDLCALC 86 03/18/2020   LDLDIRECT 101.2 05/20/2009   TRIG 88 10/03/2020   CHOLHDL 3 03/18/2020    --------------------------------------------------------------------------------------------------  ASSESSMENT AND PLAN: Permanent atrial fibrillation: Ventricular rates adequately controlled today.  Event monitor this summer showed average low normal ventricular rates, though wide variation in heart rates noted.  We will defer medication changes today.  We discussed switching from rivaroxaban to apixaban, though Paula Luna wishes to continue her current medications.  I will check a BMP and CBC today to ensure stable renal function/GFR as well as hemoglobin in the setting of epistaxis.  Syncope: Most likely precipitated by bradycardia/hypotension in the setting of bowel obstruction this summer.  No further events noted.  No prolonged pauses noted on event monitor.  No further work-up recommended at this time.  AV nodal blocking agents will continue to be avoided.  Hypertension: Blood pressure mildly elevated today.  Given intermittent lightheadedness, prior syncope, age, and fall risk, we will allow for modest permissive hypertension.  No medication changes today.  Leg edema: No significant edema noted today.  Continue as needed  furosemide with BMP today to assess renal function and electrolytes.  Follow-up: Return to clinic in 6 months.  Sofie Hartigan, MD 07/29/2021 1:28 PM

## 2021-07-29 NOTE — Patient Instructions (Signed)
Medication Instructions:   Your physician recommends that you continue on your current medications as directed. Please refer to the Current Medication list given to you today.  *If you need a refill on your cardiac medications before your next appointment, please call your pharmacy*   Lab Work:  Today: BMET, CBC  If you have labs (blood work) drawn today and your tests are completely normal, you will receive your results only by: MyChart Message (if you have MyChart) OR A paper copy in the mail If you have any lab test that is abnormal or we need to change your treatment, we will call you to review the results.   Testing/Procedures:  None ordered   Follow-Up: At [facility], you and your health needs are our priority.  As part of our continuing mission to provide you with exceptional heart care, we have created designated Provider Care Teams.  These Care Teams include your primary Cardiologist (physician) and Advanced Practice Providers (APPs -  Physician Assistants and Nurse Practitioners) who all work together to provide you with the care you need, when you need it.  We recommend signing up for the patient portal called "MyChart".  Sign up information is provided on this After Visit Summary.  MyChart is used to connect with patients for Virtual Visits (Telemedicine).  Patients are able to view lab/test results, encounter notes, upcoming appointments, etc.  Non-urgent messages can be sent to your provider as well.   To learn more about what you can do with MyChart, go to [email or web address].    Your next appointment:   6 month(s)  The format for your next appointment:   In Person  Provider:   You may see Yvonne Kendall, MD or one of the following Advanced Practice Providers on your designated Care Team:   Nicolasa Ducking, NP Eula Listen, PA-C Cadence Fransico Michael, [place]

## 2021-07-30 ENCOUNTER — Telehealth: Payer: Self-pay | Admitting: *Deleted

## 2021-07-30 LAB — BASIC METABOLIC PANEL
BUN/Creatinine Ratio: 23 (ref 12–28)
BUN: 18 mg/dL (ref 10–36)
CO2: 24 mmol/L (ref 20–29)
Calcium: 9.5 mg/dL (ref 8.7–10.3)
Chloride: 104 mmol/L (ref 96–106)
Creatinine, Ser: 0.8 mg/dL (ref 0.57–1.00)
Glucose: 118 mg/dL — ABNORMAL HIGH (ref 70–99)
Potassium: 4.9 mmol/L (ref 3.5–5.2)
Sodium: 141 mmol/L (ref 134–144)
eGFR: 68 mL/min/{1.73_m2} (ref >59)

## 2021-07-30 LAB — CBC
Hematocrit: 39.7 % (ref 34.0–46.6)
Hemoglobin: 12.8 g/dL (ref 11.1–15.9)
MCH: 24.7 pg — ABNORMAL LOW (ref 26.6–33.0)
MCHC: 32.2 g/dL (ref 31.5–35.7)
MCV: 77 fL — ABNORMAL LOW (ref 79–97)
Platelets: 206 10*3/uL (ref 150–450)
RBC: 5.18 x10E6/uL (ref 3.77–5.28)
RDW: 14.6 % (ref 11.7–15.4)
WBC: 4.9 10*3/uL (ref 3.4–10.8)

## 2021-07-30 NOTE — Telephone Encounter (Signed)
Spoke to pt's daughter, Paula Luna, [place] approved.  Notified of lab results and Dr. Serita Kyle recc.  [name] voiced understanding.   Paula Luna states she would likely agree to switching to apixaban 2.5 mg twice daily.  She states pt has enough rivaroxaban to last until early January.  She does not want me to send in new Rx of apixaban just yet and would like to call me back next week to confirm.  At time of [name]'s return call, will then leave samples of apixaban 2.5 mg to bridge as well as 30 day new start coupon.  She does understand that pt would not start apixaban until rivaroxaban has been exhausted.  Paula Luna states she is not concerned with cost, only wants to make sure this is what she wants to do and will call me back early next week.  Paula Luna has no further questions at this time.

## 2021-07-30 NOTE — Telephone Encounter (Signed)
-----   Message from Yvonne Kendall, MD sent at 07/30/2021  7:01 AM EST ----- Please let Paula Luna know that her blood counts are stable.  Her kidney function is actually a little bit better compared with 3 months ago.  Given that her GFR is above 50, we would ideally increase rivaroxaban to 20 mg daily.  However, given her age, fluctuating kidney status, and epistaxis, I think it might be safer to transition her to apixaban 2.5 mg twice daily.  This may provide a more favorable stroke prevention and bleeding risk profile.  I think it is reasonable for her to exhaust her current supply of rivaroxaban and then switch to apixaban 2.5 mg twice daily, if she is willing to make this transition.  If needed, we can provide some apixaban samples to help bridge her until the new year if there are cost concerns regarding switching therapies at this time.

## 2021-08-10 NOTE — Telephone Encounter (Signed)
Patient daughter calling to discuss med and advise below.   Please call to discuss after tomorrow as patient is having ent eval and wants to decide for sure after .

## 2021-08-11 DIAGNOSIS — R54 Age-related physical debility: Secondary | ICD-10-CM | POA: Diagnosis not present

## 2021-08-11 DIAGNOSIS — I10 Essential (primary) hypertension: Secondary | ICD-10-CM | POA: Diagnosis not present

## 2021-08-11 DIAGNOSIS — I4821 Permanent atrial fibrillation: Secondary | ICD-10-CM | POA: Diagnosis not present

## 2021-08-11 DIAGNOSIS — J340 Abscess, furuncle and carbuncle of nose: Secondary | ICD-10-CM | POA: Diagnosis not present

## 2021-08-11 DIAGNOSIS — R040 Epistaxis: Secondary | ICD-10-CM | POA: Diagnosis not present

## 2021-08-11 NOTE — Telephone Encounter (Signed)
Attempted to call pt's daughter, [name], back. No answer. Lmtcb.

## 2021-08-13 NOTE — Telephone Encounter (Signed)
Daughter calling to report ENT appt went well .  Patient cauterized in office and was given medications to assist with bleeding and controlling.    Patient does not want to make a change in meds unless Dr. Okey Luna feels it is absolutely necessary.    Please call to discuss

## 2021-08-18 NOTE — Telephone Encounter (Signed)
Called and spoke with pt's daughter, [name].  Both pt and daughter would prefer to stay on current dose Xarelto 15 mg daily, unless Dr. Okey Dupre feels it is absolutely necessary.  [name] states pt has not had any nose bleeds since cauterization at ENT.  Pt is hesitant to change medications while she is now feeling well.  Notified [name] that I will make Dr. Okey Dupre aware and will call her with his recc.

## 2021-08-18 NOTE — Telephone Encounter (Signed)
As long as Ms. Durnell does not have further nose bleeds, we can continue current Xarelto regimen.  Yvonne Kendall, MD [facility]

## 2021-08-19 NOTE — Telephone Encounter (Signed)
Spoke with Paula Luna and notified of Dr. Serita Kyle recc below.  [name] voiced understanding and has no further questions.

## 2021-08-31 ENCOUNTER — Ambulatory Visit (INDEPENDENT_AMBULATORY_CARE_PROVIDER_SITE_OTHER): Payer: PPO | Admitting: *Deleted

## 2021-08-31 DIAGNOSIS — I10 Essential (primary) hypertension: Secondary | ICD-10-CM

## 2021-08-31 DIAGNOSIS — R2689 Other abnormalities of gait and mobility: Secondary | ICD-10-CM

## 2021-08-31 DIAGNOSIS — R54 Age-related physical debility: Secondary | ICD-10-CM

## 2021-08-31 DIAGNOSIS — H543 Unqualified visual loss, both eyes: Secondary | ICD-10-CM

## 2021-08-31 NOTE — Progress Notes (Signed)
Patient receive samples from another office and is enrolled in a discount program for Xarelto.

## 2021-08-31 NOTE — Chronic Care Management (AMB) (Signed)
Chronic Care Management    Clinical Social Work Note  08/31/2021 Name: Paula Luna MRN: [id number] DOB: 10/22/1924  Paula Luna is a 87 y.o. year old female who is a primary care patient of Copland, Karleen Hampshire, MD. The CCM team was consulted to assist the patient with chronic disease management and/or care coordination needs related to: [employer] , Level of Care Concerns, Caregiver Stress, and Financial Difficulties related to limited income .   Engaged with patient by telephone for follow up visit in response to provider referral for social work chronic care management and care coordination services.   Consent to Services:  The patient was given information about Chronic Care Management services, agreed to services, and gave verbal consent prior to initiation of services.  Please see initial visit note for detailed documentation.   Patient agreed to services and consent obtained.   Assessment: Review of patient past medical history, allergies, medications, and health status, including review of relevant consultants reports was performed today as part of a comprehensive evaluation and provision of chronic care management and care coordination services.     SDOH (Social Determinants of Health) assessments and interventions performed:    Advanced Directives Status: See Care Plan for related entries.  CCM Care Plan  Allergies  Allergen Reactions   Latex Rash   Tetanus Toxoid Anaphylaxis    REACTION: anaphylaxis   Tramadol Other (See Comments)    Caused severe constipation---bowel obstruction   Alendronate Sodium     REACTION: passed out    Outpatient Encounter Medications as of 08/31/2021  Medication Sig   Cholecalciferol (VITAMIN D) 50 MCG (2000 UT) CAPS Take 1 capsule by mouth daily.   docusate sodium (COLACE) 100 MG capsule Take 100 mg by mouth daily.   fluticasone (FLONASE) 50 MCG/ACT nasal spray Place 2 sprays into both nostrils daily as needed for allergies  or rhinitis.   furosemide (LASIX) 20 MG tablet Take 20mg  3 days per week PRN   lisinopril (ZESTRIL) 40 MG tablet Take 1 tablet by mouth once daily   Multiple Vitamins-Minerals (ICAPS) CAPS Take 1 capsule by mouth 2 (two) times daily.    pantoprazole (PROTONIX) 40 MG tablet Take 1 tablet (40 mg total) by mouth daily.   Rivaroxaban (XARELTO) 15 MG TABS tablet TAKE 1 TABLET BY MOUTH ONCE DAILY WITH SUPPER   No facility-administered encounter medications on file as of 08/31/2021.    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   Ischemic necrosis of small bowel (HCC)    Macular degeneration of left eye 01/06/2015   Essential hypertension 12/25/2013   VASOVAGAL SYNCOPE 05/20/2009    Conditions to be addressed/monitored: Osteoporosis; Financial constraints related to income, Level of care concerns, Limited access to caregiver, and Lacks knowledge of community resource:    Care Plan : LCSW Plan of Care  Updates made by 07/20/2009, LCSW since 08/31/2021 12:00 AM     Problem: Lack of resources and support for optimized safety and Quality of Life   Priority: High     Long-Range Goal: Improve overall safety, quality of life through support and resources   Start Date: 07/28/2021  Expected End Date: 10/11/2021  This Visit's Progress: On track  Recent Progress: On track  Priority: High  Note:   Current barriers:    Financial constraints related  to limited income, ADL IADL limitations, and Limited access to caregiver Clinical Goals: Patient will work with CSW to address needs related to improving care, safety and support Clinical Interventions:   CSW spoke with pt's HCPOA/daughter, Paula Luna, who reports her mother is doing well- "she will be 89 next month".  Daughter reports her mother has not wanted to look into her  eligibility for Medicaid- as previously discussed pt likely meets the financial guidelines and this would help with her outstanding medical bills (to [facility]). When/if approved for Medicaid, she will be able to also apply for the [facility] program for personal care services in the home.    Assessment of needs, barriers , agencies contacted, as well as how impacting  Solution-Focused Strategies  Active listening / Reflection utilized  Emotional Support Provided Problem [place] strategies reviewed Caregiver stress acknowledged  Review various resources, discussed options and provided patient information about  Department of Orthoptist ( apply for [employer] and Physicist, medical ) [place] (PCS) 1:1 collaboration with primary care provider regarding development and update of comprehensive plan of care as evidenced by provider attestation and co-signature Inter-disciplinary care team collaboration (see longitudinal plan of care) Patient Goals/Self-Care Activities: Over the next 60 days Go to  Department of Social Services [phone] to apply for Medicaid and food stamps        Follow Up Plan: Appointment scheduled for SW follow up with client by phone on: 09/28/21      Eduard Clos MSW, [place] Licensed Clinical Social Worker [place] [phone]

## 2021-08-31 NOTE — Patient Instructions (Signed)
Visit Information  Thank you for taking time to visit with me today. Please don't hesitate to contact me if I can be of assistance to you before our next scheduled telephone appointment.  Following are the goals we discussed today:  (Copy and paste patient goals from clinical care plan here)  Our next appointment is by telephone on 09/28/21 at 10am  Please call the care guide team at [phone] if you need to cancel or reschedule your appointment.   If you are experiencing a Mental Health or Behavioral Health Crisis or need someone to talk to, please call the Suicide and Crisis Lifeline: 988 call 1-800-273-TALK (toll free, 24 hour hotline) call 911   The patient verbalized understanding of instructions, educational materials, and care plan provided today and declined offer to receive copy of patient instructions, educational materials, and care plan.    Reece Levy MSW, LCSW Licensed Clinical Social Worker [facility] [phone]

## 2021-09-01 ENCOUNTER — Other Ambulatory Visit: Payer: Self-pay | Admitting: Internal Medicine

## 2021-09-01 ENCOUNTER — Encounter: Payer: Self-pay | Admitting: Podiatry

## 2021-09-01 ENCOUNTER — Other Ambulatory Visit: Payer: Self-pay

## 2021-09-01 ENCOUNTER — Ambulatory Visit: Payer: PPO | Admitting: Podiatry

## 2021-09-01 DIAGNOSIS — B351 Tinea unguium: Secondary | ICD-10-CM

## 2021-09-01 DIAGNOSIS — M79676 Pain in unspecified toe(s): Secondary | ICD-10-CM | POA: Diagnosis not present

## 2021-09-01 DIAGNOSIS — I4821 Permanent atrial fibrillation: Secondary | ICD-10-CM

## 2021-09-01 NOTE — Progress Notes (Signed)
She presents today with her daughter chief complaint of painful elongated toenails.  Objective: Toenails are long thick yellow dystrophic Lee mycotic painful palpation.  Assessment: Pain limb secondary to onychomycosis.  Plan: Debridement of toenails 1 through 5 bilateral.

## 2021-09-02 NOTE — Telephone Encounter (Signed)
Please review

## 2021-09-02 NOTE — Telephone Encounter (Signed)
Prescription refill request for Xarelto received.  Indication: Atrial fib Last office visit: 07/29/21  C End MD Weight: 55.3kg Age: 87 Scr: 0.80 on 07/29/21 CrCl: 37.54  Based on above findings Xarelto 15mg  daily (Renal) is the appropriate dose.  Refill approved.

## 2021-09-07 ENCOUNTER — Telehealth: Payer: Self-pay

## 2021-09-07 NOTE — Chronic Care Management (AMB) (Addendum)
° ° °  Chronic Care Management Pharmacy Assistant   Name: Paula Luna  MRN: [id number] DOB: [birth year]  Reason for Encounter: General Disease State   Recent office visits:  07/28/21-Family Medicine-Social Worker telephone call, no medication changes  Recent consult visits:  09/01/21-Podiatry-Patient presented for toe nail trimming,no medication changes 07/29/21-Cardiology-Patient presented for follow up atrial fibrillation.  Hospital visits:  None in previous 6 months  Medications: Outpatient Encounter Medications as of 09/07/2021  Medication Sig   Rivaroxaban (XARELTO) 15 MG TABS tablet TAKE 1 TABLET BY MOUTH ONCE DAILY WITH SUPPER   Cholecalciferol (VITAMIN D) 50 MCG (2000 UT) CAPS Take 1 capsule by mouth daily.   docusate sodium (COLACE) 100 MG capsule Take 100 mg by mouth daily.   fluticasone (FLONASE) 50 MCG/ACT nasal spray Place 2 sprays into both nostrils daily as needed for allergies or rhinitis.   furosemide (LASIX) 20 MG tablet Take 20mg  3 days per week PRN   gentamicin ointment (GARAMYCIN) 0.1 % Apply topically 3 (three) times daily.   lisinopril (ZESTRIL) 40 MG tablet Take 1 tablet by mouth once daily   Multiple Vitamins-Minerals (ICAPS) CAPS Take 1 capsule by mouth 2 (two) times daily.    pantoprazole (PROTONIX) 40 MG tablet Take 1 tablet (40 mg total) by mouth daily.   No facility-administered encounter medications on file as of 09/07/2021.       Contacted Paula Luna on 09/08/21 for general disease state and medication adherence call. Spoke with the daughter 09/10/21 on the patients behalf.  Patient is not > 5 days past due for refill on the following medications per chart history:  Star Medications: Medication Name/mg Last Fill Days Supply Lisinopril 40mg   07/11/21 90   What concerns do you have about your medications? The patient has no concerns at this time   The patient denies side effects with her medications.   How often do you forget or  accidentally miss a dose? Never  Do you use a pillbox? Yes  Are you having any problems getting your medications from your pharmacy? No- Xarelto is expensive,however, the patient has enough to get through January 2023.  Has the cost of your medications been a concern? Yes  Xarelto is expensive at this time, however first of the year it will be affordable for the patient .   Since last visit with CPP, the following interventions have been made: 07/29/21-Cardiology-discussed switching blood thinner medication, patient prefers to stay with current Xarelto use  The patient has not had an ED visit since last contact.   The patient denies problems with their health. The patient reports after ENT evaluation and treatment she reports no nose bleeds.  she denies  concerns or questions for February 2023, Pharm. D at this time.   Counseled patient on:  07/31/21 job taking medications, Importance of taking medication daily without missed doses, Benefits of adherence packaging or a pillbox, and Access to CCM team for any cost, medication or pharmacy concerns.   Care Gaps: Annual wellness visit in last year? No Most Recent BP reading:144/90  71-P  07/29/21  No appointments scheduled within the next 30 days.  [place], CPP notified  07/31/21, [facility] Pharm[facility] Assistant [phone]  I have reviewed the care management and care coordination activities outlined in this encounter and I am certifying that I agree with the content of this note. No further action required.  [facility], PharmD Clinical Pharmacist [facility] [phone]

## 2021-09-10 ENCOUNTER — Telehealth: Payer: Self-pay

## 2021-09-10 NOTE — Telephone Encounter (Addendum)
I spoke with [name] and pt did fall on 09/09/21 while trying to apply break on walker and while pt was pushing down on break pt fell to floor hitting a table with her head and then a glass bottle that did not break fell off table and hit pt on lt side of head at hair line. [name] said she had just stepped out of room when this happened. [name] said pt did not lose consciousness and [name] helped pt up into chair. Applied ice on and off all last night and still using ice on and off this morning. [name] said that pt slept well last night and was easy to get up this morning and pt is doing her usual routine this morning without issues. [name] said the knot on head is near the hair line and about the size of 1/2 of a golf ball. No H/A, no dizziness, no vision changes. Advised due to fall and pt has a knot on head and pt is 89 take pt to UC or ED at least for eval. [name] said prefers not to go to ED or UC since pt is doing OK. UC & ED precautions given and [name] voiced understanding and [name] said any changes prior to appt on 09/14/20 she will take pt to [facility] or ED. I advised [name] will send note to Dr Milinda Antis for review and if any changes or other recommendations someone will call [name] back. [name] voiced understanding.Sending note to Dr Milinda Antis, [facility] CMA and will teams [place].

## 2021-09-10 NOTE — Telephone Encounter (Signed)
Agree with ER/UC precautions Will see her then

## 2021-09-11 DIAGNOSIS — I10 Essential (primary) hypertension: Secondary | ICD-10-CM

## 2021-09-11 DIAGNOSIS — R54 Age-related physical debility: Secondary | ICD-10-CM

## 2021-09-14 ENCOUNTER — Encounter: Payer: Self-pay | Admitting: Family Medicine

## 2021-09-14 ENCOUNTER — Ambulatory Visit (INDEPENDENT_AMBULATORY_CARE_PROVIDER_SITE_OTHER): Payer: PPO | Admitting: Family Medicine

## 2021-09-14 ENCOUNTER — Ambulatory Visit (INDEPENDENT_AMBULATORY_CARE_PROVIDER_SITE_OTHER)
Admission: RE | Admit: 2021-09-14 | Discharge: 2021-09-14 | Disposition: A | Discharge status: Home / Self Care | Payer: PPO | Source: Ambulatory Visit | Attending: Family Medicine | Admitting: Family Medicine

## 2021-09-14 ENCOUNTER — Other Ambulatory Visit: Payer: Self-pay

## 2021-09-14 VITALS — BP 136/78 | HR 55 | Temp 98.0°F | Ht <= 58 in | Wt 121.1 lb

## 2021-09-14 DIAGNOSIS — K5900 Constipation, unspecified: Secondary | ICD-10-CM | POA: Insufficient documentation

## 2021-09-14 DIAGNOSIS — M4126 Other idiopathic scoliosis, lumbar region: Secondary | ICD-10-CM | POA: Diagnosis not present

## 2021-09-14 DIAGNOSIS — M545 Low back pain, unspecified: Secondary | ICD-10-CM | POA: Diagnosis not present

## 2021-09-14 DIAGNOSIS — S0990XA Unspecified injury of head, initial encounter: Secondary | ICD-10-CM | POA: Diagnosis not present

## 2021-09-14 NOTE — Telephone Encounter (Signed)
[facility] Night - Client Nonclinical Telephone Record  [facility] Client [facility] Night - Client Client Site [facility] [place] - Night Provider Hannah Beat - MD Contact Type Call Who Is Calling Patient / Member / Family / Caregiver Caller Name Clint Bolder Phone Number [phone] Patient Name Paula Luna Patient DOB 1925-08-02 Call Type Message Only Information Provided Reason for Call Request for General Office Information Initial Comment Caller states her mom fell last week, has an appt. on Tuesday at 1030 and needs to know the location. Disp. Time Disposition Final User 09/12/2021 12:13:53 PM General Information Provided Yes Michell Heinrich Call Closed By: Michell Heinrich Transaction Date/Time: 09/12/2021 12:09:48 PM (ET

## 2021-09-14 NOTE — Progress Notes (Signed)
Subjective:    Patient ID: Paula Luna, female    DOB: 07-02-1925, 88 y.o.   MRN: [id number]  This visit occurred during the SARS-CoV-2 public health emergency.  Safety protocols were in place, including screening questions prior to the visit, additional usage of staff PPE, and extensive cleaning of exam room while observing appropriate contact time as indicated for disinfecting solutions.   HPI 88 yo pt of Dr Lorelei Pont presents with a fall/head injury She takes xarelto for a fib   Wt Readings from Last 3 Encounters:  09/14/21 121 lb 2 oz (54.9 kg)  07/29/21 122 lb (55.3 kg)  04/30/21 119 lb (54 kg)   25.32 kg/m Bp is stable  BP Readings from Last 3 Encounters:  09/14/21 136/78  07/29/21 (!) 144/90  04/30/21 130/80   Pulse Readings from Last 3 Encounters:  09/14/21 (!) 55  07/29/21 71  04/30/21 78    She fell on 12/29 as she tried to apply the break on her walker  [address] to floor-head hit table and glass bottle  No LOC , go tup with assist Was advised ER or UC-declined because she felt ok  Had a knot on head near hair line  A lot of bruising now over forehead worse on the left   Head does not hurt now  Head hurt very briefly  Not dizzy Not nauseated  No personality change   Back hurts a bit /low since fall  Takes a stool softener  1-2 daily  Not having bowel movement  Has had SBO in the past  Feels the urge to go/some gas  Was not drinking as much as she normally does   Juice and water with lemon juice   H/o T11 fracture/incidental seen on CT  Could not take alendronate in the past  Patient Active Problem List   Diagnosis Date Noted   Head injury 09/14/2021   Lumbar pain 09/14/2021   Leg edema 05/01/2021   SBO (small bowel obstruction) ([place]) 10/01/2020   Permanent atrial fibrillation ([place]) 04/26/2017   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 03/2017 abd pain-->internal herina and ischemic jejunum s/p ex lap and small bowel resection.   Macular degeneration of left eye 01/06/2015   Osteoporosis    Permanent atrial fibrillation ([place])    a. First noted 03/2017;  b. 07/2017 Echo: EF 60-65%, no rwma, mild MR, mildly to mod dil LA; c. 07/2017 48hr Holter: persistent Afib, avg HR of 66 (range 39-140 bpm);  d. CHA2DS2VASc = 4-->Xarelto initiated 07/2017.   Past Surgical History:  Procedure Laterality Date   CLOSED REDUCTION PROXIMAL HUMERUS FRACTURE Left 2017   LAPAROSCOPIC SMALL BOWEL RESECTION     LAPAROTOMY N/A 03/18/2017   Procedure: EXPLORATORY LAPAROTOMY, SMALL BOWEL RESECTION;  Surgeon: Jules Husbands, MD;  Location: [facility] ORS;  Service: General;  Laterality: N/A;   TOTAL ABDOMINAL HYSTERECTOMY     Social History   Tobacco Use   Smoking status: Former   Smokeless tobacco: Never  Vaping Use   Vaping Use: Never used  Substance Use Topics   Alcohol use: No   Drug use: No   Family History  Problem Relation Age of Onset   Diabetes Mother  Cancer Father        jaw   Allergies  Allergen Reactions   Latex Rash   Tetanus Toxoid Anaphylaxis    REACTION: anaphylaxis   Tramadol Other (See Comments)    Caused severe constipation---bowel obstruction   Alendronate Sodium     REACTION: passed out   Current Outpatient Medications on File Prior to Visit  Medication Sig Dispense Refill   Cholecalciferol (VITAMIN D) 50 MCG (2000 UT) CAPS Take 1 capsule by mouth daily.     docusate sodium (COLACE) 100 MG capsule Take 100 mg by mouth daily.     furosemide (LASIX) 20 MG tablet Take 20mg  3 days per week PRN     gentamicin ointment (GARAMYCIN) 0.1 % Apply topically 3 (three) times daily.     lisinopril (ZESTRIL) 40 MG tablet Take 1 tablet by mouth once daily 90 tablet 0    Multiple Vitamins-Minerals (ICAPS) CAPS Take 1 capsule by mouth 2 (two) times daily.      pantoprazole (PROTONIX) 40 MG tablet Take 1 tablet (40 mg total) by mouth daily. 90 tablet 3   Rivaroxaban (XARELTO) 15 MG TABS tablet TAKE 1 TABLET BY MOUTH ONCE DAILY WITH SUPPER 30 tablet 5   No current facility-administered medications on file prior to visit.    Review of Systems  Constitutional:  Negative for activity change, appetite change, fatigue, fever and unexpected weight change.  HENT:  Negative for congestion, ear pain, rhinorrhea, sinus pressure and sore throat.   Eyes:  Negative for pain, redness and visual disturbance.  Respiratory:  Negative for cough, shortness of breath and wheezing.   Cardiovascular:  Negative for chest pain and palpitations.  Gastrointestinal:  Negative for abdominal pain, blood in stool, constipation and diarrhea.  Endocrine: Negative for polydipsia and polyuria.  Genitourinary:  Negative for dysuria, frequency and urgency.  Musculoskeletal:  Positive for back pain. Negative for arthralgias and myalgias.  Skin:  Negative for pallor and rash.  Allergic/Immunologic: Negative for environmental allergies.  Neurological:  Negative for dizziness, tremors, syncope, facial asymmetry, speech difficulty, weakness, light-headedness, numbness and headaches.  Hematological:  Negative for adenopathy. Does not bruise/bleed easily.  Psychiatric/Behavioral:  Negative for behavioral problems, confusion, decreased concentration and dysphoric mood. The patient is not nervous/anxious.       Objective:   Physical Exam Constitutional:      General: She is not in acute distress.    Appearance: Normal appearance. She is well-developed and normal weight. She is not ill-appearing or diaphoretic.  HENT:     Head: Normocephalic and atraumatic.     Nose: Nose normal. No congestion.     Mouth/Throat:     Mouth: Mucous membranes are moist.     Pharynx: Oropharynx is clear.  Eyes:      General:        Right eye: No discharge.        Left eye: No discharge.     Extraocular Movements: Extraocular movements intact.     Conjunctiva/sclera: Conjunctivae normal.     Pupils: Pupils are equal, round, and reactive to light.  Neck:     Thyroid: No thyromegaly.     Vascular: No carotid bruit or JVD.  Cardiovascular:     Rate and Rhythm: Regular rhythm. Bradycardia present.     Heart sounds: Normal heart sounds.    No gallop.  Pulmonary:     Effort: Pulmonary effort is normal. No respiratory distress.     Breath sounds: Normal breath sounds. No wheezing  or rales.  Abdominal:     General: Bowel sounds are normal. There is no distension or abdominal bruit.     Palpations: Abdomen is soft. There is no mass.     Tenderness: There is no abdominal tenderness. There is no guarding or rebound.     Comments: Nl bs 4 Q  Musculoskeletal:     Cervical back: Normal range of motion and neck supple. No tenderness.     Right lower leg: No edema.     Left lower leg: No edema.     Comments: Some midline and R lateral tenderness in upper lumbar area  Able to get up from chair and ambulate Some loss of lordosis but no overt deformity    Lymphadenopathy:     Cervical: No cervical adenopathy.  Skin:    General: Skin is warm and dry.     Coloration: Skin is not pale.     Findings: Bruising present. No erythema or rash.     Comments: Old bruising across frontal head and lower on the L  Bump on head - L crown area, soft and mildly tender  No skin interruption   Neurological:     Mental Status: She is alert and oriented to person, place, and time.     Cranial Nerves: Cranial nerves 2-12 are intact.     Sensory: Sensation is intact.     Motor: Motor function is intact. No weakness, abnormal muscle tone or pronator drift.     Coordination: Coordination is intact. Coordination normal. Finger-Nose-Finger Test normal.     Gait: Gait is intact.     Deep Tendon Reflexes: Reflexes are normal and  symmetric. Reflexes normal.     Comments: Gait is steady with assist and also walker  Psychiatric:        Mood and Affect: Mood normal.        Cognition and Memory: Cognition normal.     Comments: Quiet, pleasant  Answers questions appropriately  Supportive family present           Assessment & Plan:   Problem List Items Addressed This Visit       Other   Head injury - Primary    Fall on 12/29-hitting table/bottle with L side of head, no LOC and no c/o headache  (pt declined ER or UC) Some slowed concentration  Reassuring exam  Disc need for close monitoring for subdural hematoma symptoms in light of chronic anticoagulant use  Low threshold to image with CT should anything change  Family will watch closely  Can use cool compress on head and expect gradual improvement in bruising        Lumbar pain    Some discomfort and tenderness over upper to mid LS today since fall  Is mobile and takes some tylenol H/o osteopenia and past T11 comp fx LS xray ordered today  Adv use of ice heat and avoidance of another fall or impact       Relevant Orders   DG Lumbar Spine Complete (Completed)   Constipation    Acute on chronic in setting of past sbo No nausea currently, is passing gas and has urge to go Taking stool softener Recommend adding miralax at least once daily  Disc imp of fluids  Update if not starting to improve in a week or if worsening

## 2021-09-14 NOTE — Assessment & Plan Note (Signed)
Acute on chronic in setting of past sbo No nausea currently, is passing gas and has urge to go Taking stool softener Recommend adding miralax at least once daily  Disc imp of fluids  Update if not starting to improve in a week or if worsening

## 2021-09-14 NOTE — Assessment & Plan Note (Signed)
Some discomfort and tenderness over upper to mid LS today since fall  Is mobile and takes some tylenol H/o osteopenia and past T11 comp fx LS xray ordered today  Adv use of ice heat and avoidance of another fall or impact

## 2021-09-14 NOTE — Assessment & Plan Note (Signed)
Fall on 12/29-hitting table/bottle with L side of head, no LOC and no c/o headache  (pt declined ER or UC) Some slowed concentration  Reassuring exam  Disc need for close monitoring for subdural hematoma symptoms in light of chronic anticoagulant use  Low threshold to image with CT should anything change  Family will watch closely  Can use cool compress on head and expect gradual improvement in bruising

## 2021-09-14 NOTE — Patient Instructions (Addendum)
Miralax is safe for use along with the stool softener Prune juice is helpful also   Xray of lumbar spine today   Watch for  Headache Dizziness  Profound vision change  Nausea/vomiting Personality change Confusion   Let us know   Rest your brain- limit screens/reading -when you are tired take breaks  Tylenol is ok

## 2021-09-14 NOTE — Telephone Encounter (Signed)
I spoke with  Johnsie Cancel signed and she is aware pts appt is at [facility] on [facility]. Nothing further needed.

## 2021-09-21 ENCOUNTER — Telehealth: Payer: Self-pay | Admitting: Family Medicine

## 2021-09-21 NOTE — Telephone Encounter (Signed)
[name] notified as instructed by telephone.  [name] ask if she should give her mom some Pedialyte in case she got dehydrated from all the loose stools she has had.  I recommended that she just make sure her mom is drinking plenty of water.

## 2021-09-21 NOTE — Telephone Encounter (Signed)
[name] called, re: mom's  her bowels are moving after about 5 days of constipation, gave miralax and it is working but now bowel movements are really loose, and she went 4 times to the bathroom yesterday so today  [name] would like to try the following:  No miralax One stool softener (had been giving her two during constipation period) Stop prune juice  Continue to give orange juice  [name] wants to know if this is okay? [name] works from 11-3 and is unable to take calls at work. Would prefer a call before or after

## 2021-09-28 ENCOUNTER — Telehealth: Payer: PPO

## 2021-09-29 ENCOUNTER — Telehealth: Payer: Self-pay | Admitting: *Deleted

## 2021-09-29 ENCOUNTER — Encounter: Payer: Self-pay | Admitting: Family Medicine

## 2021-09-29 ENCOUNTER — Other Ambulatory Visit: Payer: Self-pay

## 2021-09-29 ENCOUNTER — Ambulatory Visit (INDEPENDENT_AMBULATORY_CARE_PROVIDER_SITE_OTHER): Payer: PPO | Admitting: Family Medicine

## 2021-09-29 VITALS — BP 130/80 | HR 71 | Temp 98.2°F | Ht <= 58 in | Wt 119.2 lb

## 2021-09-29 DIAGNOSIS — E782 Mixed hyperlipidemia: Secondary | ICD-10-CM

## 2021-09-29 DIAGNOSIS — N1831 Chronic kidney disease, stage 3a: Secondary | ICD-10-CM

## 2021-09-29 DIAGNOSIS — Z79899 Other long term (current) drug therapy: Secondary | ICD-10-CM | POA: Diagnosis not present

## 2021-09-29 DIAGNOSIS — Z0000 Encounter for general adult medical examination without abnormal findings: Secondary | ICD-10-CM | POA: Diagnosis not present

## 2021-09-29 LAB — HEPATIC FUNCTION PANEL
ALT: 32 U/L (ref 0–35)
AST: 27 U/L (ref 0–37)
Albumin: 3.9 g/dL (ref 3.5–5.2)
Alkaline Phosphatase: 103 U/L (ref 39–117)
Bilirubin, Direct: 0.1 mg/dL (ref 0.0–0.3)
Total Bilirubin: 0.6 mg/dL (ref 0.2–1.2)
Total Protein: 6.9 g/dL (ref 6.0–8.3)

## 2021-09-29 LAB — BASIC METABOLIC PANEL
BUN: 17 mg/dL (ref 6–23)
CO2: 26 mEq/L (ref 19–32)
Calcium: 9.1 mg/dL (ref 8.4–10.5)
Chloride: 101 mEq/L (ref 96–112)
Creatinine, Ser: 1.11 mg/dL (ref 0.40–1.20)
GFR: 42.41 mL/min — ABNORMAL LOW (ref >60.00)
Glucose, Bld: 82 mg/dL (ref 70–99)
Potassium: 4.2 mEq/L (ref 3.5–5.1)
Sodium: 136 mEq/L (ref 135–145)

## 2021-09-29 LAB — CBC WITH DIFFERENTIAL/PLATELET
Basophils Absolute: 0.1 10*3/uL (ref 0.0–0.1)
Basophils Relative: 0.9 % (ref 0.0–3.0)
Eosinophils Absolute: 0.1 10*3/uL (ref 0.0–0.7)
Eosinophils Relative: 1.8 % (ref 0.0–5.0)
HCT: 33.9 % — ABNORMAL LOW (ref 36.0–46.0)
Hemoglobin: 10.9 g/dL — ABNORMAL LOW (ref 12.0–15.0)
Lymphocytes Relative: 36.3 % (ref 12.0–46.0)
Lymphs Abs: 2.1 10*3/uL (ref 0.7–4.0)
MCHC: 32.2 g/dL (ref 30.0–36.0)
MCV: 91.1 fl (ref 78.0–100.0)
Monocytes Absolute: 0.4 10*3/uL (ref 0.1–1.0)
Monocytes Relative: 7.5 % (ref 3.0–12.0)
Neutro Abs: 3.1 10*3/uL (ref 1.4–7.7)
Neutrophils Relative %: 53.5 % (ref 43.0–77.0)
Platelets: 176 10*3/uL (ref 150.0–400.0)
RBC: 3.72 Mil/uL — ABNORMAL LOW (ref 3.87–5.11)
RDW: 12.6 % (ref 11.5–15.5)
WBC: 5.8 10*3/uL (ref 4.0–10.5)

## 2021-09-29 LAB — LIPID PANEL
Cholesterol: 155 mg/dL (ref 0–200)
HDL: 44.5 mg/dL (ref >39.00)
LDL Cholesterol: 81 mg/dL (ref 0–99)
NonHDL: 110.29
Total CHOL/HDL Ratio: 3
Triglycerides: 145 mg/dL (ref 0.0–149.0)
VLDL: 29 mg/dL (ref 0.0–40.0)

## 2021-09-29 NOTE — Telephone Encounter (Signed)
°  Care Management   Follow Up Note   09/29/2021 Name: Paula Luna MRN: [id number] DOB: 06-04-1925   Referred by: Hannah Beat, MD Reason for referral : No chief complaint on file.   An unsuccessful telephone outreach was attempted today. The patient was referred to the case management team for assistance with care management and care coordination.   Follow Up Plan: The care management team will reach out to the patient again over the next 10 days.   Reece Levy MSW, LCSW Licensed Clinical Social Worker [facility]   [phone]

## 2021-09-29 NOTE — Progress Notes (Signed)
Paula Luna T. Desiree Fleming, MD, CAQ Sports Medicine [facility] [address] [place], [id number]  Phone: [phone]   FAX: [phone]  Paula Luna Linebaugh - 88 y.o. female   MRN [id number]   Date of Birth: 02/03/1925  Date: 09/29/2021   PCP: Hannah Beatopland, Niklaus Mamaril, MD   Referral: Hannah Beatopland, Satomi Buda, MD  Chief Complaint  Patient presents with   Medicare Wellness    This visit occurred during the SARS-CoV-2 public health emergency.  Safety protocols were in place, including screening questions prior to the visit, additional usage of staff PPE, and extensive cleaning of exam room while observing appropriate contact time as indicated for disinfecting solutions.   Patient Care Team: Hannah Beatopland, Derrin Currey, MD as PCP - General End, Cristal Deerhristopher, MD as PCP - Cardiology (Cardiology) Phil DoppAdams, Michelle, [facility] as Pharmacist (Pharmacist) Buck Mamaldwell, Janet P, LCSW as Social Worker (Licensed Clinical Social Worker) Subjective:   Paula Luna Canizalez is a 88 y.o. pleasant patient who presents for a medicare wellness examination:  Health Maintenance Summary Reviewed and updated, unless pt declines services.  Tobacco History Reviewed. Non-smoker Alcohol: No concerns, no excessive use Exercise Habits: walking with walker STD concerns: none Drug Use: None Birth control method: n/a Menses regular: n/a Lumps or breast concerns: no Breast Cancer Family History: no  Covid bivalent booster    Health Maintenance  Topic Date Due   Zoster Vaccines- Shingrix (2 of 2) 05/01/2018   COVID-19 Vaccine (3 - Pfizer risk series) 01/17/2020   Pneumonia Vaccine 6365+ Years old  Completed   INFLUENZA VACCINE  Completed   DEXA SCAN  Completed   HPV VACCINES  Aged Out   Immunization History  Administered Date(s) Administered   Fluad Quad(high Dose 65+) 06/15/2020, 06/18/2021   Influenza Split 07/12/2012, 06/28/2013   Influenza Whole 08/20/2007, 05/10/2010   Influenza, High Dose Seasonal PF  08/14/2014, 07/29/2015, 05/22/2016, 06/10/2017, 06/20/2018   Influenza,inj,Quad PF,6+ Mos 05/07/2019   PFIZER(Purple Top)SARS-COV-2 Vaccination 11/29/2019, 12/20/2019   Pneumococcal Conjugate-13 12/25/2013   Pneumococcal Polysaccharide-23 05/10/2010   Zoster Recombinat (Shingrix) 03/06/2018   Zoster, Live 10/15/2012    Patient Active Problem List   Diagnosis Date Noted   CKD (chronic kidney disease) stage 3, GFR 30-59 ml/min (HCC) 10/04/2021   SBO (small bowel obstruction) (HCC) 10/01/2020   Permanent atrial fibrillation (HCC) 04/26/2017   Ischemic bowel disease (HCC) 03/18/2017   Macular degeneration of left eye 01/06/2015   Essential hypertension 12/25/2013    Past Medical History:  Diagnosis Date   CKD (chronic kidney disease) stage 3, GFR 30-59 ml/min (HCC) 10/04/2021   Hypertension 12/25/2013   Ischemic bowel disease (HCC)    a. 03/2017 abd pain-->internal herina and ischemic jejunum s/p ex lap and small bowel resection.   Macular degeneration of left eye 01/06/2015   Osteoporosis    Permanent atrial fibrillation (HCC)    a. First noted 03/2017;  b. 07/2017 Echo: EF 60-65%, no rwma, mild MR, mildly to mod dil LA; c. 07/2017 48hr Holter: persistent Afib, avg HR of 66 (range 39-140 bpm);  Luna. CHA2DS2VASc = 4-->Xarelto initiated 07/2017.    Past Surgical History:  Procedure Laterality Date   CLOSED REDUCTION PROXIMAL HUMERUS FRACTURE Left 2017   LAPAROSCOPIC SMALL BOWEL RESECTION     LAPAROTOMY N/A 03/18/2017   Procedure: EXPLORATORY LAPAROTOMY, SMALL BOWEL RESECTION;  Surgeon: Leafy RoPabon, Diego F, MD;  Location: [facility] ORS;  Service: General;  Laterality: N/A;   TOTAL ABDOMINAL HYSTERECTOMY      Family History  Problem  Relation Age of Onset   Diabetes Mother    Cancer Father        jaw    Past Medical History, Surgical History, Social History, Family History, Problem List, Medications, and Allergies have been reviewed and updated if relevant.  Review of Systems: Pertinent  positives are listed above.  Otherwise, a full 14 point review of systems has been done in full and it is negative except where it is noted positive.  Objective:   BP 130/80    Pulse 71    Temp 98.2 F (36.8 C) (Temporal)    Ht 4\' 10"  (1.473 m)    Wt 119 lb 4 oz (54.1 kg)    SpO2 97%    BMI 24.92 kg/m  Fall Risk 04/04/2021 04/05/2021 04/05/2021 04/06/2021 09/29/2021  Falls in the past year? - - - - 1  Was there an injury with Fall? - - - - 1  Fall Risk Category Calculator - - - - 3  Fall Risk Category - - - - High  Patient Fall Risk Level High fall risk High fall risk High fall risk High fall risk -  Patient at Risk for Falls Due to - - - - -  Patient at Risk for Falls Due to - - - - -   Ideal Body Weight: Weight in (lb) to have BMI = 25: 119.4 Hearing Screening  Method: Audiometry   500Hz  1000Hz  2000Hz  4000Hz   Right ear 20 20 20  0  Left ear 20 20 20  0  Vision Screening - Comments:: Wears Glasses-Eye Exam 07/2021 [place] Depression screen [facility] 2/9 09/29/2021 03/25/2020 02/18/2019 02/13/2018  Decreased Interest 0 0 0 0  Down, Depressed, Hopeless 0 0 0 0  PHQ - 2 Score 0 0 0 0  Altered sleeping - - 0 0  Tired, decreased energy - - 0 0  Change in appetite - - 0 0  Feeling bad or failure about yourself  - - 0 0  Trouble concentrating - - 0 0  Moving slowly or fidgety/restless - - 0 0  Suicidal thoughts - - 0 0  PHQ-9 Score - - 0 0  Difficult doing work/chores - - Not difficult at all Not difficult at all  Some recent data might be hidden     GEN: well developed, well nourished, no acute distress Eyes: conjunctiva and lids normal, PERRLA, EOMI ENT: TM clear, nares clear, oral exam WNL Neck: supple, no lymphadenopathy, no thyromegaly, no JVD Pulm: clear to auscultation and percussion, respiratory effort normal CV: irreg, irreg Chest: no scars, masses, no lumps BREAST: no lumps, no axillary LAD, no nipple discharge GI: soft, non-tender; no hepatosplenomegaly, masses; active bowel sounds  all quadrants GU: Normal external female genitalia. Cervix appears intact without lesions or irritation. Vaginal canal normal without ulceration or lesion. Cervix NT to exam. Ovaries neither enlarged nor tender. (Chaperoned examination by female staff) Lymph: no cervical, axillary or inguinal adenopathy MSK: gait normal, muscle tone and strength WNL, no joint swelling, effusions, discoloration, crepitus  SKIN: clear, good turgor, color WNL, no rashes, lesions, or ulcerations Neuro: normal mental status, normal strength, sensation, and motion Psych: alert; oriented to person, place and time, normally interactive and not anxious or depressed in appearance.  All labs reviewed with patient.  Results for orders placed or performed in visit on 09/29/21  Basic metabolic panel  Result Value Ref Range   Sodium 136 135 - 145 mEq/L   Potassium 4.2 3.5 - 5.1 mEq/L   Chloride  101 96 - 112 mEq/L   CO2 26 19 - 32 mEq/L   Glucose, Bld 82 70 - 99 mg/dL   BUN 17 6 - 23 mg/dL   Creatinine, Ser 3.41 0.40 - 1.20 mg/dL   GFR 96.22 (L) >29.79 mL/min   Calcium 9.1 8.4 - 10.5 mg/dL  CBC with Differential/Platelet  Result Value Ref Range   WBC 5.8 4.0 - 10.5 K/uL   RBC 3.72 (L) 3.87 - 5.11 Mil/uL   Hemoglobin 10.9 (L) 12.0 - 15.0 g/dL   HCT 89.2 (L) 11.9 - 41.7 %   MCV 91.1 78.0 - 100.0 fl   MCHC 32.2 30.0 - 36.0 g/dL   RDW 40.8 14.4 - 81.8 %   Platelets 176.0 150.0 - 400.0 K/uL   Neutrophils Relative % 53.5 43.0 - 77.0 %   Lymphocytes Relative 36.3 12.0 - 46.0 %   Monocytes Relative 7.5 3.0 - 12.0 %   Eosinophils Relative 1.8 0.0 - 5.0 %   Basophils Relative 0.9 0.0 - 3.0 %   Neutro Abs 3.1 1.4 - 7.7 K/uL   Lymphs Abs 2.1 0.7 - 4.0 K/uL   Monocytes Absolute 0.4 0.1 - 1.0 K/uL   Eosinophils Absolute 0.1 0.0 - 0.7 K/uL   Basophils Absolute 0.1 0.0 - 0.1 K/uL  Hepatic function panel  Result Value Ref Range   Total Bilirubin 0.6 0.2 - 1.2 mg/dL   Bilirubin, Direct 0.1 0.0 - 0.3 mg/dL   Alkaline  Phosphatase 103 39 - 117 U/L   AST 27 0 - 37 U/L   ALT 32 0 - 35 U/L   Total Protein 6.9 6.0 - 8.3 g/dL   Albumin 3.9 3.5 - 5.2 g/dL  Lipid panel  Result Value Ref Range   Cholesterol 155 0 - 200 mg/dL   Triglycerides 563.1 0.0 - 149.0 mg/dL   HDL 49.70 >26.37 mg/dL   VLDL 85.8 0.0 - 85.0 mg/dL   LDL Cholesterol 81 0 - 99 mg/dL   Total CHOL/HDL Ratio 3    NonHDL 110.29     DG Lumbar Spine Complete  Result Date: 09/14/2021 CLINICAL DATA:  87 year old female status post fall with back pain. History of compression fracture. EXAM: LUMBAR SPINE - COMPLETE 4+ VIEW COMPARISON:  CT Abdomen and Pelvis 04/02/2021. FINDINGS: Normal lumbar segmentation. Moderate dextroconvex lumbar scoliosis. Lumbar vertebral height and alignment appears stable since last year with severe chronic disc and endplate degeneration at L3-L4 and L4-L5 eccentric to the left. Chronic T11 compression fracture. T12 superior endplate concave appearance seems increased since July. Extensive Aortoiliac calcified atherosclerosis. Negative visible bowel gas pattern. IMPRESSION: 1. No acute osseous abnormality identified in the lumbar spine, but suspicion of mild T12 compression fracture new since July. If specific therapy is desired, Lumbar MRI or Nuclear Medicine Whole-body Bone Scan would best determine acuity and candidacy for vertebroplasty. 2. Chronic T11 compression fracture. 3.  Aortic Atherosclerosis (ICD10-I70.0). Electronically Signed   By: Odessa Fleming M.Luna.   On: 09/14/2021 11:34    Assessment and Plan:     ICD-10-CM   1. Healthcare maintenance  Z00.00     2. Mixed hyperlipidemia  E78.2 Lipid panel    3. Encounter for long-term (current) use of medications  Z79.899 Basic metabolic panel    CBC with Differential/Platelet    Hepatic function panel    4. Stage 3a chronic kidney disease (HCC)  N18.31      She is doing fairly well at 68, and mentally she is very with it.  She is walking now with a walker, but she can do  basically everything on the forced floor of her home, and she does have some additional help coming in 2 days a week.  She is mildly anemic, she does have some stable chronic kidney disease.  Me that all this is reassuring.  Health Maintenance Exam: The patient's preventative maintenance and recommended screening tests for an annual wellness exam were reviewed in full today. Brought up to date unless services declined.  Counselled on the importance of diet, exercise, and its role in overall health and mortality. The patient's FH and SH was reviewed, including their home life, tobacco status, and drug and alcohol status.  Follow-up in 1 year for physical exam or additional follow-up below.  I have personally reviewed the Medicare Annual Wellness questionnaire and have noted 1. The patient's medical and social history 2. Their use of alcohol, tobacco or illicit drugs 3. Their current medications and supplements 4. The patient's functional ability including ADL's, fall risks, home safety risks and hearing or visual             impairment. 5. Diet and physical activities 6. Evidence for depression or mood disorders 7. Reviewed Updated provider list, see scanned forms and [facility] Snapshot.  8. Reviewed whether or not the patient has HCPOA or living will, and discussed what this means with the patient.  Recommended she bring in a copy for his chart in [facility].  The patients weight, height, BMI and visual acuity have been recorded in the chart I have made referrals, counseling and provided education to the patient based review of the above and I have provided the pt with a written personalized care plan for preventive services.  I have provided the patient with a copy of your personalized plan for preventive services. Instructed to take the time to review along with their updated medication list.  Follow-up: No follow-ups on file.  Future Appointments  Date Time Provider Department Center  10/11/2021   9:00 AM [facility] [place] SOCIAL [facility]  12/01/2021  1:45 PM [place], Max T, DPM [facility]  01/26/2022  9:20 AM End, Cristal Deer, MD [facility]  07/20/2022  1:00 PM [facility] PHARMACIST 2 [facility]    No orders of the defined types were placed in this encounter.  There are no discontinued medications. Orders Placed This Encounter  Procedures   Basic metabolic panel   CBC with Differential/Platelet   Hepatic function panel   Lipid panel    Signed,  Airen Dales T. Krrish Freund, MD   Allergies as of 09/29/2021       Reactions   Latex Rash   Tetanus Toxoid Anaphylaxis   REACTION: anaphylaxis   Tramadol Other (See Comments)   Caused severe constipation---bowel obstruction   Alendronate Sodium    REACTION: passed out        Medication List        Accurate as of September 29, 2021 11:59 PM. If you have any questions, ask your nurse or doctor.          docusate sodium 100 MG capsule Commonly known as: COLACE Take 100 mg by mouth daily.   furosemide 20 MG tablet Commonly known as: LASIX Take 20mg  3 days per week PRN   gentamicin ointment 0.1 % Commonly known as: GARAMYCIN Apply topically 3 (three) times daily.   ICaps Caps Take 1 capsule by mouth 2 (two) times daily.   lisinopril 40 MG tablet Commonly known as: ZESTRIL Take  1 tablet by mouth once daily   pantoprazole 40 MG tablet Commonly known as: PROTONIX Take 1 tablet (40 mg total) by mouth daily.   Vitamin Luna 50 MCG (2000 UT) Caps Take 1 capsule by mouth daily.   Xarelto 15 MG Tabs tablet Generic drug: Rivaroxaban TAKE 1 TABLET BY MOUTH ONCE DAILY WITH SUPPER

## 2021-10-01 NOTE — Chronic Care Management (AMB) (Signed)
°  Care Management   Note  10/01/2021 Name: Paula Luna MRN: [id number] DOB: 02-Mar-1925  Paula Luna is a 88 y.o. year old female who is a primary care patient of Copland, Karleen Hampshire, MD and is actively engaged with the care management team. I reached out to [name] by phone today to assist with re-scheduling a follow up visit with the Licensed Clinical Social Worker  Follow up plan: Telephone appointment with care management team member scheduled for: 10/11/2021  Burman Nieves, CCMA Care Guide, Embedded Care Coordination [facility]   Care Management  Direct Dial: [phone]

## 2021-10-04 ENCOUNTER — Encounter: Payer: Self-pay | Admitting: Family Medicine

## 2021-10-04 DIAGNOSIS — N183 Chronic kidney disease, stage 3 unspecified: Secondary | ICD-10-CM | POA: Insufficient documentation

## 2021-10-04 HISTORY — DX: Chronic kidney disease, stage 3 unspecified: N18.30

## 2021-10-05 ENCOUNTER — Telehealth: Payer: Self-pay | Admitting: Family Medicine

## 2021-10-05 NOTE — Telephone Encounter (Signed)
Please triage for more information.

## 2021-10-05 NOTE — Telephone Encounter (Signed)
I spoke with [name] (DPR signed); [name] said starting 4 - 6 wks ago pt started on and off with runny nose. No pattern and nothing can be pin pointed that causes the runny nose.[name] said runny nose is usually during the day. No fever, body aches, chills, H/A, cough, SOB, S/T, head or sinus congestion. [name] said the pt is not sick but is more of an annoyance with the runny nose on and off. If pt sneezes or blows her nose which might be one time a day the mucus is clear. Pt has taken OTC [facility] allergy relief med but it did not help at all. [name] said pt has been eating more homemade soup due to constipation and that seems to help pt stay warm also but when pt eats soup her nose runs. [name] wonders if could be due to weather changes between warm and cold or if her house is too dry. [name] said she wants med either prescription or OTC that Dr Lorelei Pont would suggest to dry up pts nose. Sending note to Dr Lorelei Pont and Butch Penny CMA. [name] request cb after reviewed by Dr Lorelei Pont.[place]

## 2021-10-05 NOTE — Telephone Encounter (Signed)
Pt daughter called stating that pt nose keeps runny,pt not congested or anything just has a runny nose. Please advise.

## 2021-10-06 NOTE — Telephone Encounter (Signed)
[name] notified as instructed by telephone.  Flonase added to medication list.

## 2021-10-06 NOTE — Telephone Encounter (Signed)
Probably nothing to worry about.  I would blow when runny.  They can try some nasal fluticasone once daily - 2 sprays each nostril.

## 2021-10-11 ENCOUNTER — Ambulatory Visit (INDEPENDENT_AMBULATORY_CARE_PROVIDER_SITE_OTHER): Payer: PPO | Admitting: *Deleted

## 2021-10-11 DIAGNOSIS — S22080D Wedge compression fracture of T11-T12 vertebra, subsequent encounter for fracture with routine healing: Secondary | ICD-10-CM

## 2021-10-11 DIAGNOSIS — E782 Mixed hyperlipidemia: Secondary | ICD-10-CM

## 2021-10-11 DIAGNOSIS — N1831 Chronic kidney disease, stage 3a: Secondary | ICD-10-CM

## 2021-10-11 DIAGNOSIS — I10 Essential (primary) hypertension: Secondary | ICD-10-CM

## 2021-10-11 NOTE — Chronic Care Management (AMB) (Signed)
Chronic Care Management    Clinical Social Work Note  10/11/2021 Name: Paula Luna MRN: [id number] DOB: Apr 14, 1925  Paula Luna is a 88 y.o. year old female who is a primary care patient of Copland, Frederico Hamman, MD. The CCM team was consulted to assist the patient with chronic disease management and/or care coordination needs related to: [employer] .   Engaged with patient by telephone for follow up visit in response to provider referral for social work chronic care management and care coordination services.   Consent to Services:  The patient was given information about Chronic Care Management services, agreed to services, and gave verbal consent prior to initiation of services.  Please see initial visit note for detailed documentation.   Patient agreed to services and consent obtained.   Assessment: Review of patient past medical history, allergies, medications, and health status, including review of relevant consultants reports was performed today as part of a comprehensive evaluation and provision of chronic care management and care coordination services.     SDOH (Social Determinants of Health) assessments and interventions performed:    Advanced Directives Status: Not addressed in this encounter.  CCM Care Plan  Allergies  Allergen Reactions   Latex Rash   Tetanus Toxoid Anaphylaxis    REACTION: anaphylaxis   Tramadol Other (See Comments)    Caused severe constipation---bowel obstruction   Alendronate Sodium     REACTION: passed out    Outpatient Encounter Medications as of 10/11/2021  Medication Sig   Cholecalciferol (VITAMIN D) 50 MCG (2000 UT) CAPS Take 1 capsule by mouth daily.   docusate sodium (COLACE) 100 MG capsule Take 100 mg by mouth daily.   fluticasone (FLONASE) 50 MCG/ACT nasal spray Place 2 sprays into both nostrils daily.   furosemide (LASIX) 20 MG tablet Take 20mg  3 days per week PRN   gentamicin ointment (GARAMYCIN) 0.1 % Apply topically 3  (three) times daily.   lisinopril (ZESTRIL) 40 MG tablet Take 1 tablet by mouth once daily   Multiple Vitamins-Minerals (ICAPS) CAPS Take 1 capsule by mouth 2 (two) times daily.    pantoprazole (PROTONIX) 40 MG tablet Take 1 tablet (40 mg total) by mouth daily.   Rivaroxaban (XARELTO) 15 MG TABS tablet TAKE 1 TABLET BY MOUTH ONCE DAILY WITH SUPPER   No facility-administered encounter medications on file as of 10/11/2021.    Patient Active Problem List   Diagnosis Date Noted   CKD (chronic kidney disease) stage 3, GFR 30-59 ml/min (HCC) 10/04/2021   SBO (small bowel obstruction) ([place]) 10/01/2020   Permanent atrial fibrillation ([place]) 04/26/2017   Ischemic bowel disease ([place]) 03/18/2017   Macular degeneration of left eye 01/06/2015   Essential hypertension 12/25/2013    Conditions to be addressed/monitored: Atrial Fibrillation; Limited social support, Social Isolation, and Limited access to caregiver  Care Plan : LCSW Plan of Care  Updates made by Deirdre Peer, LCSW since 10/11/2021 12:00 AM     Problem: Lack of resources and support for optimized safety and Quality of Life   Priority: High     Long-Range Goal: Improve overall safety, quality of life through support and resources   Start Date: 07/28/2021  Expected End Date: 12/09/2021  This Visit's Progress: On track  Recent Progress: On track  Priority: High  Note:   Current barriers:    Financial constraints related to limited income, ADL IADL limitations, and Limited access to caregiver Clinical Goals: Patient will work with CSW to address needs related to improving  care, safety and support Clinical Interventions:   CSW spoke with pt's HCPOA/daughter, [name], who reports her mother is doing well-  pt turned 89 and they were able to have a small family party which pt enjoyed; "mom even got up with her walker to shake her booty to some swing music".  Daughter does not want to "push or upset mom" so has not applied for Medicaid.  They have hired some caregiver support which is a big help.  Daughter denies any current concerns and agreeable to follow up call in March. Assessment of needs, barriers , agencies contacted, as well as how impacting  Solution-Focused Strategies  Active listening / Reflection utilized  Emotional Support Provided Problem [place] strategies reviewed Caregiver stress acknowledged  Review various resources, discussed options and provided patient information about  Department of Orthoptist ( apply for [employer] and Physicist, medical ) [place] (PCS) 1:1 collaboration with primary care provider regarding development and update of comprehensive plan of care as evidenced by provider attestation and co-signature Inter-disciplinary care team collaboration (see longitudinal plan of care) Patient Goals/Self-Care Activities: Over the next 60 days Go to  Department of Social Services [phone] to apply for Medicaid and food stamps        Follow Up Plan: Appointment scheduled for SW follow up with client by phone on: 11/15/21     Eduard Clos MSW, [employer] Licensed Clinical Social Worker [place]   [phone]

## 2021-10-11 NOTE — Patient Instructions (Signed)
Visit Information  Thank you for taking time to visit with me today. Please don't hesitate to contact me if I can be of assistance to you before our next scheduled telephone appointment.  Following are the goals we discussed today:  (Copy and paste patient goals from clinical care plan here)  Our next appointment is by telephone on   11/15/21 at  10:30  Please call the care guide team at [phone] if you need to cancel or reschedule your appointment.   If you are experiencing a Mental Health or Behavioral Health Crisis or need someone to talk to, please call 1-800-273-TALK (toll free, 24 hour hotline) go to [facility] [address] [place] [phone]) call 911   The patient verbalized understanding of instructions, educational materials, and care plan provided today and declined offer to receive copy of patient instructions, educational materials, and care plan.  Reece Levy MSW, LCSW Licensed Clinical Social Worker [facility] [place]   [phone]

## 2021-10-12 DIAGNOSIS — N1831 Chronic kidney disease, stage 3a: Secondary | ICD-10-CM | POA: Diagnosis not present

## 2021-10-12 DIAGNOSIS — I10 Essential (primary) hypertension: Secondary | ICD-10-CM

## 2021-10-12 DIAGNOSIS — E782 Mixed hyperlipidemia: Secondary | ICD-10-CM | POA: Diagnosis not present

## 2021-10-17 ENCOUNTER — Other Ambulatory Visit: Payer: Self-pay | Admitting: Internal Medicine

## 2021-10-18 NOTE — Telephone Encounter (Signed)
°*  STAT* If patient is at the pharmacy, call can be transferred to refill team.   1. Which medications need to be refilled? (please list name of each medication and dose if known) lisinopril 40 MG 1 tablet daily   2. Which pharmacy/location (including street and city if local pharmacy) is medication to be sent to? [facility] on [address]  3. Do they need a 30 day or 90 day supply? 90 day

## 2021-11-12 ENCOUNTER — Ambulatory Visit (INDEPENDENT_AMBULATORY_CARE_PROVIDER_SITE_OTHER): Payer: PPO | Admitting: *Deleted

## 2021-11-12 DIAGNOSIS — S22080D Wedge compression fracture of T11-T12 vertebra, subsequent encounter for fracture with routine healing: Secondary | ICD-10-CM

## 2021-11-12 DIAGNOSIS — I10 Essential (primary) hypertension: Secondary | ICD-10-CM

## 2021-11-12 NOTE — Chronic Care Management (AMB) (Signed)
?Chronic Care Management  ? ? Clinical Social Work Note ? ?11/12/2021 ?Name: Paula Luna MRN: [id number] DOB: 1925-01-21 ? ?Paula Luna is a 88 y.o. year old female who is a primary care patient of Copland, Karleen Hampshire, MD. The CCM team was consulted to assist the patient with chronic disease management and/or care coordination needs related to: [employer] , Level of Care Concerns, Caregiver Stress, and Financial Difficulties related to limited income .  ? ?Engaged with patient face to face for follow up visit in response to provider referral for social work chronic care management and care coordination services.  ? ?Consent to Services:  ?The patient was given information about Chronic Care Management services, agreed to services, and gave verbal consent prior to initiation of services.  Please see initial visit note for detailed documentation.  ? ?Patient agreed to services and consent obtained.  ? ?Assessment: Review of patient past medical history, allergies, medications, and health status, including review of relevant consultants reports was performed today as part of a comprehensive evaluation and provision of chronic care management and care coordination services.    ? ?SDOH (Social Determinants of Health) assessments and interventions performed:   ? ?Advanced Directives Status: See Care Plan for related entries. ? ?CCM Care Plan ? ?Allergies  ?Allergen Reactions  ? Latex Rash  ? Tetanus Toxoid Anaphylaxis  ?  REACTION: anaphylaxis  ? Tramadol Other (See Comments)  ?  Caused severe constipation---bowel obstruction  ? Alendronate Sodium   ?  REACTION: passed out  ? ? ?Outpatient Encounter Medications as of 11/12/2021  ?Medication Sig  ? Cholecalciferol (VITAMIN D) 50 MCG (2000 UT) CAPS Take 1 capsule by mouth daily.  ? docusate sodium (COLACE) 100 MG capsule Take 100 mg by mouth daily.  ? fluticasone (FLONASE) 50 MCG/ACT nasal spray Place 2 sprays into both nostrils daily.  ? furosemide (LASIX) 20 MG  tablet Take 20mg  3 days per week PRN  ? gentamicin ointment (GARAMYCIN) 0.1 % Apply topically 3 (three) times daily.  ? lisinopril (ZESTRIL) 40 MG tablet Take 1 tablet by mouth once daily  ? Multiple Vitamins-Minerals (ICAPS) CAPS Take 1 capsule by mouth 2 (two) times daily.   ? pantoprazole (PROTONIX) 40 MG tablet Take 1 tablet (40 mg total) by mouth daily.  ? Rivaroxaban (XARELTO) 15 MG TABS tablet TAKE 1 TABLET BY MOUTH ONCE DAILY WITH SUPPER  ? ?No facility-administered encounter medications on file as of 11/12/2021.  ? ? ?Patient Active Problem List  ? Diagnosis Date Noted  ? CKD (chronic kidney disease) stage 3, GFR 30-59 ml/min (HCC) 10/04/2021  ? SBO (small bowel obstruction) (HCC) 10/01/2020  ? Permanent atrial fibrillation (HCC) 04/26/2017  ? Ischemic bowel disease (HCC) 03/18/2017  ? Macular degeneration of left eye 01/06/2015  ? Essential hypertension 12/25/2013  ? ? ?Conditions to be addressed/monitored: Osteoarthritis; Housing barriers and Lacks knowledge of community resource:   ? ?Care Plan : LCSW Plan of Care  ?Updates made by 12/27/2013, LCSW since 11/12/2021 12:00 AM  ?  ? ?Problem: Lack of resources and support for optimized safety and Quality of Life   ?Priority: High  ?  ? ?Long-Range Goal: Improve overall safety, quality of life through support and resources   ?Start Date: 07/28/2021  ?Expected End Date: 02/08/2022  ?This Visit's Progress: On track  ?Recent Progress: On track  ?Priority: High  ?Note:   ?Current barriers:   ? Financial constraints related to limited income, ADL IADL limitations, and Limited access  to caregiver ?Clinical Goals: Patient will work with CSW to address needs related to improving care, safety and support ?Clinical Interventions:  ? ?11/11/21- CSW spoke with pt's HCPOA/daughter, [name], who reports her mother is doing well-  pt has a caregiver 2 days per week which allows daughter to have some respite/support; as well, pt enjoys the caregiver.   ?Daughter does share they  both have been dealing with runny nose they cannot get rid of- encouraged daughter to follow up with PCP if not resolved soon.  ?Daughter feels things are in a good place- would like to find something she can do in the home (or maybe outside the home) to improve her activity/stimulation and overall quality of life.  ?Pt received a "plush doggy" that has eyes that light up as a 89th birthday gift  in January. Daughter shares that she carries the toy doggy around with her in the walker and talks to it.  ?Validated daughter's comfort in knowing her mother is doing the best she can "at her age".  ? ?Daughter denies any current concerns and agreeable to follow up call in March. ?Assessment of needs, barriers , agencies contacted, as well as how impacting  ?Solution-Focused Strategies  ?Active listening / Reflection utilized  ?Emotional Support Provided ?Problem Solving /Task Center strategies reviewed ?Caregiver stress acknowledged  ?Review various resources, discussed options and provided patient information about  ?Department of Social Services ( apply for [employer] and [employer] ) ?Personal Care Services [facility]) ?1:1 collaboration with primary care provider regarding development and update of comprehensive plan of care as evidenced by provider attestation and co-signature ?Inter-disciplinary care team collaboration (see longitudinal plan of care) ?Patient Goals/Self-Care Activities: Over the next 60 days ?Go to  Department of Social Services [phone] to apply for Medicaid and food stamps ?  ?  ?  ? ?Follow Up Plan: Appointment scheduled for SW follow up with client by phone on: 12/24/21 ?     ?Reece Levy MSW, LCSW ?Licensed Clinical Social Worker ?[facility]   ?[phone]  ? ? ?

## 2021-11-12 NOTE — Patient Instructions (Signed)
Visit Information ? ?Thank you for taking time to visit with me today. Please don't hesitate to contact me if I can be of assistance to you before our next scheduled telephone appointment. ? ?Our next appointment is by telephone on 12/24/21  ? ?Please call the care guide team at [phone] if you need to cancel or reschedule your appointment.  ? ?If you are experiencing a Mental Health or Green or need someone to talk to, please call 911  ? ?Patient verbalizes understanding of instructions and care plan provided today and agrees to view in [place]. Active MyChart status confirmed with patient.   ? ?Eduard Clos MSW, LCSW ?Licensed Clinical Social Worker ?[place]   ?[phone]  ?

## 2021-11-15 ENCOUNTER — Telehealth: Payer: PPO

## 2021-11-15 MED ORDER — IPRATROPIUM BROMIDE 0.06 % NA SOLN: 2.0000 | Freq: Four times a day (QID) | NASAL | 3 refills | Status: DC

## 2021-11-15 NOTE — Telephone Encounter (Signed)
[name] notified as instructed by telephone.  Instructed to use both the Flonase and then Atrovent NS as needed.  [name] states understanding.  ?

## 2021-11-15 NOTE — Addendum Note (Signed)
Addended by: Hannah Beat on: 11/15/2021 02:10 PM ? ? Modules accepted: Orders ? ?

## 2021-11-15 NOTE — Telephone Encounter (Signed)
Pt daughter called stating that pt nose is still running. Pt daughter states that it hasn't been any change. Please advise. ?

## 2021-11-15 NOTE — Telephone Encounter (Signed)
I sent in some Atrovent nasal spray for her to try as needed. ?

## 2021-12-01 ENCOUNTER — Ambulatory Visit: Payer: PPO | Admitting: Podiatry

## 2021-12-01 ENCOUNTER — Encounter: Payer: Self-pay | Admitting: Podiatry

## 2021-12-01 ENCOUNTER — Other Ambulatory Visit: Payer: Self-pay

## 2021-12-01 DIAGNOSIS — M79676 Pain in unspecified toe(s): Secondary | ICD-10-CM

## 2021-12-01 DIAGNOSIS — B351 Tinea unguium: Secondary | ICD-10-CM | POA: Diagnosis not present

## 2021-12-01 NOTE — Progress Notes (Signed)
She presents today with her daughter chief complaint of painful elongated toenails. ? ?Objective: Vital signs are stable she is alert and oriented x3 mild edema about the bilateral lower extremity and the pulses are palpable bilateral.  No open lesions or wounds are noted but toenails are long thick yellow dystrophic clinically mycotic sharply incurvated particularly hallux right. ? ?Assessment: Pain in limb secondary to onychomycosis bilateral. ? ?Plan: Debridement of toenails 1 through 5 bilateral slant backs to the tibial fibular border the hallux right.  I will follow-up with her in 3 months. ?

## 2021-12-10 DIAGNOSIS — I10 Essential (primary) hypertension: Secondary | ICD-10-CM

## 2022-01-12 DIAGNOSIS — H353134 Nonexudative age-related macular degeneration, bilateral, advanced atrophic with subfoveal involvement: Secondary | ICD-10-CM | POA: Diagnosis not present

## 2022-01-12 DIAGNOSIS — H3589 Other specified retinal disorders: Secondary | ICD-10-CM | POA: Diagnosis not present

## 2022-01-17 ENCOUNTER — Telehealth: Payer: Self-pay | Admitting: Internal Medicine

## 2022-01-17 ENCOUNTER — Other Ambulatory Visit: Payer: Self-pay | Admitting: Internal Medicine

## 2022-01-17 NOTE — Telephone Encounter (Signed)
? ?*  STAT* If patient is at the pharmacy, call can be transferred to refill team. ? ? ?1. Which medications need to be refilled? (please list name of each medication and dose if known) lisinopril (ZESTRIL) 40 MG tablet ? ?2. Which pharmacy/location (including street and city if local pharmacy) is medication to be sent to? [facility] [address] [place] - [address] ? ?3. Do they need a 30 day or 90 day supply? 90 days ? ?Per pt's daughter, pt only have 2 pills left. Needs refill today  ?

## 2022-01-17 NOTE — Telephone Encounter (Signed)
lisinopril (ZESTRIL) 40 MG tablet 90 tablet 0 01/17/2022    ?Sig: Take 1 tablet by mouth once daily   ? ?Pharmacy ? ?[facility] [address], [place] - [address]  ? ? ? ?

## 2022-01-19 ENCOUNTER — Telehealth: Payer: Self-pay

## 2022-01-19 NOTE — Chronic Care Management (AMB) (Signed)
    Chronic Care Management Pharmacy Assistant   Name: Paula Luna  MRN: [id number] DOB: [birth year]   Reason for Encounter:General Adherence     Recent office visits:  11/12/21-Paula Luna,MSW,LCSW- phone call follow up  10/11/21-Paula Luna,MSW,LCSW- phone call follow up  09/29/21-Paula Copland,MD(PCP)  Recent consult visits:  01/12/22-Paula Tsamis,MD(Ophthalmology)f/u macular degeneration, continue areds 2, f/u 6 months 12/01/21-Paula Luna,DPM-toenail trimming  Hospital visits:  None in previous 6 months  Medications: Outpatient Encounter Medications as of 01/19/2022  Medication Sig   lisinopril (ZESTRIL) 40 MG tablet Take 1 tablet by mouth once daily   Cholecalciferol (VITAMIN D) 50 MCG (2000 UT) CAPS Take 1 capsule by mouth daily.   docusate sodium (COLACE) 100 MG capsule Take 100 mg by mouth daily.   fluticasone (FLONASE) 50 MCG/ACT nasal spray Place 2 sprays into both nostrils daily.   furosemide (LASIX) 20 MG tablet Take 20mg  3 days per week PRN   gentamicin ointment (GARAMYCIN) 0.1 % Apply topically 3 (three) times daily.   ipratropium (ATROVENT) 0.06 % nasal spray Place 2 sprays into both nostrils 4 (four) times daily.   Multiple Vitamins-Minerals (ICAPS) CAPS Take 1 capsule by mouth 2 (two) times daily.    pantoprazole (PROTONIX) 40 MG tablet Take 1 tablet (40 mg total) by mouth daily.   Rivaroxaban (XARELTO) 15 MG TABS tablet TAKE 1 TABLET BY MOUTH ONCE DAILY WITH SUPPER   No facility-administered encounter medications on file as of 01/19/2022.       Contacted Paula Luna on 01/21/22 for general disease state and medication adherence call.   Patient is not more than 5 days past due for refill on the following medications per chart history:  [facility] Medications: Medication Name/mg Last Fill Days Supply Lisinopril 40mg   01/18/22  90   What concerns do you have about your medications? No concerns at this time   The patient denies side effects with  their medications.   How often do you forget or accidentally miss a dose? Never Daughter has organized system that works for the patient   Do you use a pillbox? Yes  the patient has eye sight issues so the daughter puts out   Are you having any problems getting your medications from your pharmacy? No  Has the cost of your medications been a concern? Xarelto is costly, but manageable  Since last visit with CPP, the following interventions have been made. Cost concerns have been discussed with the patient. The patient is managing to pay and does not want any alternative medication.  The patient has not had an ED visit since last contact.   The patient reports the following and denies problems with their health. Eye sight continues to weaken- and the patient reports she continues to have a runny nose despite using 2 nasal sprays   Patient denies concerns or questions for , PharmD at this time.   Counseled patient on:  03/20/22 job taking medications, Importance of taking medication daily without missed doses, Benefits of adherence packaging or a pillbox, and Access to CCM team for any cost, medication or pharmacy concerns.   Care Gaps: Annual wellness visit in last year? Yes Most Recent BP reading:130/80  71-P  09/29/21   Upcoming appointments: CCM appointment on 07/20/22   10/01/21, CPP notified  13/8/23, [facility] concierge  [phone]

## 2022-01-26 ENCOUNTER — Ambulatory Visit: Payer: PPO | Admitting: Internal Medicine

## 2022-01-26 ENCOUNTER — Encounter: Payer: Self-pay | Admitting: Internal Medicine

## 2022-01-26 VITALS — BP 150/88 | HR 86 | Ht <= 58 in | Wt 114.0 lb

## 2022-01-26 DIAGNOSIS — I4821 Permanent atrial fibrillation: Secondary | ICD-10-CM | POA: Diagnosis not present

## 2022-01-26 DIAGNOSIS — R600 Localized edema: Secondary | ICD-10-CM

## 2022-01-26 DIAGNOSIS — I10 Essential (primary) hypertension: Secondary | ICD-10-CM

## 2022-01-26 NOTE — Patient Instructions (Signed)

## 2022-01-26 NOTE — Progress Notes (Signed)
? ?Follow-up Outpatient Visit ?Date: 01/26/2022 ? ?Primary Care Provider: ?Hannah Beat, MD ?[address] [place] ?[place] [address] ? ?Chief Complaint: Follow-up atrial fibrillation ? ?HPI:  Paula Luna is a 88 y.o. female with history of permanent atrial fibrillation, hypertension, ischemic bowel status post bowel resection with recent hospitalization for SBO, osteoporosis, and anemia, who presents for follow-up of atrial fibrillation and syncope.  I last saw her in November, at which time she was doing fairly well.  She had not had any further syncopal episodes but remain concerned about the possibility of recurrent bowel obstructions (this was the precipitant of her syncopal episode).  She reported 4 episodes of epistaxis and was scheduled to see ENT for further evaluation.  We agreed to continue her on rivaroxaban. ? ?Today, Paula Luna feels fairly well though she has noted more leg edema recently.  She tries to elevate her legs whenever she is sitting and intermittently uses furosemide.  She does not wear compression stockings.  She denies chest pain and shortness of breath.  After our last visit, she saw Dr. Jenne Campus who performed nasal cauterization.  She has not had any further epistaxis.  She notes occasional palpitations after she exercises.  She also has brief orthostatic lightheadedness when she gets up at night.  This typically abates after a few seconds.  She has not fallen or passed out. ? ?-------------------------------------------------------------------------------------------------- ? ?Past Medical History:  ?Diagnosis Date  ? CKD (chronic kidney disease) stage 3, GFR 30-59 ml/min (HCC) 10/04/2021  ? Hypertension 12/25/2013  ? Ischemic bowel disease (HCC)   ? a. 03/2017 abd pain-->internal herina and ischemic jejunum s/p ex lap and small bowel resection.  ? Macular degeneration of left eye 01/06/2015  ? Osteoporosis   ? Permanent atrial fibrillation (HCC)   ? a. First noted 03/2017;  b.  07/2017 Echo: EF 60-65%, no rwma, mild MR, mildly to mod dil LA; c. 07/2017 48hr Holter: persistent Afib, avg HR of 66 (range 39-140 bpm);  d. CHA2DS2VASc = 4-->Xarelto initiated 07/2017.  ? ?Past Surgical History:  ?Procedure Laterality Date  ? CLOSED REDUCTION PROXIMAL HUMERUS FRACTURE Left 2017  ? LAPAROSCOPIC SMALL BOWEL RESECTION    ? LAPAROTOMY N/A 03/18/2017  ? Procedure: EXPLORATORY LAPAROTOMY, SMALL BOWEL RESECTION;  Surgeon: Leafy Ro, MD;  Location: [facility] ORS;  Service: General;  Laterality: N/A;  ? TOTAL ABDOMINAL HYSTERECTOMY    ? ? ?Current Meds  ?Medication Sig  ? Cholecalciferol (VITAMIN D) 50 MCG (2000 UT) CAPS Take 1 capsule by mouth daily.  ? docusate sodium (COLACE) 100 MG capsule Take 100 mg by mouth daily.  ? fluticasone (FLONASE) 50 MCG/ACT nasal spray Place 2 sprays into both nostrils daily.  ? furosemide (LASIX) 20 MG tablet Take 20mg  3 days per week PRN  ? ipratropium (ATROVENT) 0.06 % nasal spray Place 2 sprays into both nostrils 4 (four) times daily.  ? lisinopril (ZESTRIL) 40 MG tablet Take 1 tablet by mouth once daily  ? Multiple Vitamins-Minerals (ICAPS PO) Take 1 capsule by mouth daily.  ? pantoprazole (PROTONIX) 40 MG tablet Take 1 tablet (40 mg total) by mouth daily.  ? Rivaroxaban (XARELTO) 15 MG TABS tablet TAKE 1 TABLET BY MOUTH ONCE DAILY WITH SUPPER  ? ? ?Allergies: Latex, Tetanus toxoid, Tramadol, and Alendronate sodium ? ?Social History  ? ?Tobacco Use  ? Smoking status: Former  ? Smokeless tobacco: Never  ?Vaping Use  ? Vaping Use: Never used  ?Substance Use Topics  ? Alcohol use: No  ?  Drug use: No  ? ? ?Family History  ?Problem Relation Age of Onset  ? Diabetes Mother   ? Cancer Father   ?     jaw  ? ? ?Review of Systems: ?A 12-system review of systems was performed and was negative except as noted in the HPI. ? ?-------------------------------------------------------------------------------------------------- ? ?Physical Exam: ?BP (!) 150/88 (BP Location: Left Arm,  Patient Position: Sitting, Cuff Size: Normal)   Pulse 86   Ht 4\' 10"  (1.473 m)   Wt 114 lb (51.7 kg)   SpO2 96%   BMI 23.83 kg/m?  ? ?General:  NAD.  Accompanied by her daughter. ?Neck: No JVD or HJR. ?Lungs: Clear to auscultation bilaterally without wheezes or crackles. ?Heart: Irregularly irregular rhythm without murmurs, rubs, or gallops. ?Abdomen: Soft, nontender, nondistended. ?Extremities: 1+ pretibial edema bilaterally.  Varicose veins noted in both calves. ? ?EKG: Atrial fibrillation with poor R wave progression.  No significant change from prior tracing on 07/29/2021. ? ?Lab Results  ?Component Value Date  ? WBC 5.8 09/29/2021  ? HGB 10.9 (L) 09/29/2021  ? HCT 33.9 (L) 09/29/2021  ? MCV 91.1 09/29/2021  ? PLT 176.0 09/29/2021  ? ? ?Lab Results  ?Component Value Date  ? NA 136 09/29/2021  ? K 4.2 09/29/2021  ? CL 101 09/29/2021  ? CO2 26 09/29/2021  ? BUN 17 09/29/2021  ? CREATININE 1.11 09/29/2021  ? GLUCOSE 82 09/29/2021  ? ALT 32 09/29/2021  ? ? ?Lab Results  ?Component Value Date  ? CHOL 155 09/29/2021  ? HDL 44.50 09/29/2021  ? LDLCALC 81 09/29/2021  ? LDLDIRECT 101.2 05/20/2009  ? TRIG 145.0 09/29/2021  ? CHOLHDL 3 09/29/2021  ? ? ?-------------------------------------------------------------------------------------------------- ? ?ASSESSMENT AND PLAN: ?Permanent atrial fibrillation: ?Ventricular rate remains adequately controlled.  Minimal symptoms reported.  We will plan to continue indefinite anticoagulation with rivaroxaban 15 mg daily.  No need for AV nodal blocking agents at this time. ? ?Hypertension: ?Blood pressure mildly elevated today, though Paula Luna has not taken her morning medications yet, including lisinopril.  Blood pressures are typically better when checked at other providers.  We will defer medication changes today. ? ?Leg edema: ?Likely driven primarily by venous insufficiency.  I have encouraged Paula Luna to continue elevating her legs when possible as well as to consider  using compression stockings as needed.  She should continue to use as needed furosemide as well for worsening edema.  She should continue to minimize her sodium intake. ? ?Follow-up: Return to clinic in 6 months. ? ?Sofie Hartigan, MD ?01/26/2022 ?9:22 AM ? ?

## 2022-03-02 ENCOUNTER — Ambulatory Visit: Payer: PPO | Admitting: Podiatry

## 2022-03-02 ENCOUNTER — Encounter: Payer: Self-pay | Admitting: Podiatry

## 2022-03-02 DIAGNOSIS — B351 Tinea unguium: Secondary | ICD-10-CM | POA: Diagnosis not present

## 2022-03-02 DIAGNOSIS — M79676 Pain in unspecified toe(s): Secondary | ICD-10-CM

## 2022-03-02 NOTE — Progress Notes (Signed)
She presents today with her daughter chief complaint of painfully elongated toenails.  Objective: Toenails are long thick yellow dystrophic-like mycotic sharply incurvated tender on palpation as well as debridement.  No open lesions or wounds are noted.  Pulses remain palpable.  Minimal edema to the bilateral lower extremities.  Assessment: Pain in limb secondary to onychomycosis.  Plan: Debridement of toenails 1 through 5 bilateral.  Follow-up 3 months

## 2022-03-04 ENCOUNTER — Other Ambulatory Visit: Payer: Self-pay | Admitting: Internal Medicine

## 2022-03-04 DIAGNOSIS — I4821 Permanent atrial fibrillation: Secondary | ICD-10-CM

## 2022-03-21 ENCOUNTER — Telehealth: Payer: Self-pay

## 2022-03-21 NOTE — Telephone Encounter (Signed)
F/u in office.  I am not sure exactly.

## 2022-03-21 NOTE — Telephone Encounter (Signed)
Patient is scheduled   

## 2022-03-21 NOTE — Telephone Encounter (Signed)
Please schedule office visit with Dr. Patsy Lager to evaluate/.

## 2022-03-21 NOTE — Telephone Encounter (Signed)
Patient's daughter is calling in stating that Paula Luna has issues with acid reflux. This past weekend she was eating dinner and Paula Luna took a couple of bites of food and just started having this burping fit. The burping fit was on and off for 15 minutes, afterwards Paula Luna had told her daughter to get her a trash can as she felt like she needed to puke. Paula Luna states that her mom puked up like clear stuff, she said it looked like a balloon and Paula Luna said she had to grab napkins to help Paula Luna get it out. Paula Luna wanted to know if they need to come in to discuss this or if Dr.Copland could explain what it was.

## 2022-03-24 ENCOUNTER — Encounter: Payer: Self-pay | Admitting: Family Medicine

## 2022-03-24 ENCOUNTER — Ambulatory Visit (INDEPENDENT_AMBULATORY_CARE_PROVIDER_SITE_OTHER): Payer: PPO | Admitting: *Deleted

## 2022-03-24 ENCOUNTER — Ambulatory Visit (INDEPENDENT_AMBULATORY_CARE_PROVIDER_SITE_OTHER): Payer: PPO | Admitting: Family Medicine

## 2022-03-24 VITALS — BP 130/70 | HR 75 | Temp 97.5°F | Ht <= 58 in | Wt 112.0 lb

## 2022-03-24 DIAGNOSIS — I4821 Permanent atrial fibrillation: Secondary | ICD-10-CM

## 2022-03-24 DIAGNOSIS — K219 Gastro-esophageal reflux disease without esophagitis: Secondary | ICD-10-CM | POA: Diagnosis not present

## 2022-03-24 DIAGNOSIS — E782 Mixed hyperlipidemia: Secondary | ICD-10-CM

## 2022-03-24 DIAGNOSIS — S22080D Wedge compression fracture of T11-T12 vertebra, subsequent encounter for fracture with routine healing: Secondary | ICD-10-CM

## 2022-03-24 NOTE — Progress Notes (Signed)
    Paula Jacquet T. Paula Jezewski, MD, CAQ Sports Medicine [facility] [address] [place], [address]  Phone: [phone]  FAX: [phone]  Paula Luna - 88 y.o. female  MRN [id number]  Date of Birth: [birth year]  Date: 03/24/2022  PCP: Hannah Beat, MD  Referral: Hannah Beat, MD  Chief Complaint  Patient presents with   Gastroesophageal Reflux   Subjective:   Paula Luna is a 88 y.o. very pleasant female patient with Body mass index is 23.41 kg/m. who presents with the following:  Pleasant 88 year old lady who presents with some ongoing issues with reflux.  She is generally quite pleasant and in good health for age.  Sometimes feels like going to vomit.  No food came out.  Big long baloon, and hung into the basin.  More and more came up for about 4 times.   No dysphagia or note.  Will feel a bit of a pinch sometimes.  No food at all was in it.  First time - threw up a bit one time before  When wakes up in the morning.  Feels a bit of a click in the throat.   Ongoing for many months now - nasal drainage.   Review of Systems is noted in the HPI, as appropriate  Objective:   BP 130/70   Pulse 75   Temp (!) 97.5 F (36.4 C) (Oral)   Ht 4\' 10"  (1.473 m)   Wt 112 lb (50.8 kg)   SpO2 98%   BMI 23.41 kg/m   GEN: No acute distress; alert,appropriate. PULM: Breathing comfortably in no respiratory distress PSYCH: Normally interactive.  CV: RRR, no m/g/r   Laboratory and Imaging Data:  Assessment and Plan:     ICD-10-CM   1. Gastroesophageal reflux disease without esophagitis  K21.9      No signs of dysphagia.  Sounds to be significantly worsened reflux occ.  Cont PPI.    Unsure why ongoing rhinnorhea.  No significant allergy history and no recent illness.   [employer] One speech-to-text software was used for transcription in this dictation.  Possible transcriptional errors can occur using .    Signed,  Animal nutritionist. Trooper Olander, MD   Outpatient Encounter Medications as of 03/24/2022  Medication Sig   Cholecalciferol (VITAMIN D) 50 MCG (2000 UT) CAPS Take 1 capsule by mouth daily.   docusate sodium (COLACE) 100 MG capsule Take 100 mg by mouth daily.   fluticasone (FLONASE) 50 MCG/ACT nasal spray Place 2 sprays into both nostrils daily.   furosemide (LASIX) 20 MG tablet Take 20mg  3 days per week PRN   ipratropium (ATROVENT) 0.06 % nasal spray Place 2 sprays into both nostrils 4 (four) times daily.   lisinopril (ZESTRIL) 40 MG tablet Take 1 tablet by mouth once daily   Multiple Vitamins-Minerals (ICAPS PO) Take 1 capsule by mouth daily.   pantoprazole (PROTONIX) 40 MG tablet Take 1 tablet (40 mg total) by mouth daily.   Rivaroxaban (XARELTO) 15 MG TABS tablet TAKE 1 TABLET BY MOUTH ONCE DAILY WITH SUPPER   No facility-administered encounter medications on file as of 03/24/2022.

## 2022-03-24 NOTE — Chronic Care Management (AMB) (Signed)
Chronic Care Management    Clinical Social Work Note  03/24/2022 Name: Paula Luna MRN: [id number] DOB: 1924-12-05  Paula Luna is a 88 y.o. year old female who is a primary care patient of Copland, Karleen Hampshire, MD. The CCM team was consulted to assist the patient with chronic disease management and/or care coordination needs related to: [employer] , Level of Care Concerns, and Caregiver Stress.   Engaged with patient by telephone for follow up visit in response to provider referral for social work chronic care management and care coordination services.   Consent to Services:  The patient was given information about Chronic Care Management services, agreed to services, and gave verbal consent prior to initiation of services.  Please see initial visit note for detailed documentation.   Patient agreed to services and consent obtained.   Assessment: Review of patient past medical history, allergies, medications, and health status, including review of relevant consultants reports was performed today as part of a comprehensive evaluation and provision of chronic care management and care coordination services.     SDOH (Social Determinants of Health) assessments and interventions performed:    Advanced Directives Status: See Care Plan for related entries.  CCM Care Plan  Allergies  Allergen Reactions   Latex Rash   Tetanus Toxoid Anaphylaxis    REACTION: anaphylaxis   Tramadol Other (See Comments)    Caused severe constipation---bowel obstruction   Alendronate Sodium     REACTION: passed out    Outpatient Encounter Medications as of 03/24/2022  Medication Sig   Cholecalciferol (VITAMIN D) 50 MCG (2000 UT) CAPS Take 1 capsule by mouth daily.   docusate sodium (COLACE) 100 MG capsule Take 100 mg by mouth daily.   fluticasone (FLONASE) 50 MCG/ACT nasal spray Place 2 sprays into both nostrils daily.   furosemide (LASIX) 20 MG tablet Take 20mg  3 days per week PRN    ipratropium (ATROVENT) 0.06 % nasal spray Place 2 sprays into both nostrils 4 (four) times daily.   lisinopril (ZESTRIL) 40 MG tablet Take 1 tablet by mouth once daily   Multiple Vitamins-Minerals (ICAPS PO) Take 1 capsule by mouth daily.   pantoprazole (PROTONIX) 40 MG tablet Take 1 tablet (40 mg total) by mouth daily.   Rivaroxaban (XARELTO) 15 MG TABS tablet TAKE 1 TABLET BY MOUTH ONCE DAILY WITH SUPPER   No facility-administered encounter medications on file as of 03/24/2022.    Patient Active Problem List   Diagnosis Date Noted   CKD (chronic kidney disease) stage 3, GFR 30-59 ml/min (HCC) 10/04/2021   Leg edema 05/01/2021   SBO (small bowel obstruction) (HCC) 10/01/2020   Permanent atrial fibrillation (HCC) 04/26/2017   Ischemic bowel disease (HCC) 03/18/2017   Macular degeneration of left eye 01/06/2015   Essential hypertension 12/25/2013    Conditions to be addressed/monitored: Atrial Fibrillation; Level of care concerns, Social Isolation, and Limited access to caregiver  Care Plan : LCSW Plan of Care  Updates made by 12/27/2013, LCSW since 03/24/2022 12:00 AM     Problem: Lack of resources and support for optimized safety and Quality of Life   Priority: High     Long-Range Goal: Improve overall safety, quality of life through support and resources Completed 03/24/2022  Start Date: 07/28/2021  Expected End Date: 02/08/2022  This Visit's Progress: On track  Recent Progress: On track  Priority: High  Note:   Current barriers:    Financial constraints related to limited income, ADL IADL limitations, and Limited  access to caregiver Clinical Goals: Patient will work with CSW to address needs related to improving care, safety and support Clinical Interventions:  03/24/22- CSW spoke with pt's daughter, [name], who indicates things have been going fairly well aside from a pt burping and "vomiting a balloon like bubble" which they will see PCP this afternoon about. Given no  active or current CSW needs CSW will sign off and advised daughter that PCP can place new orders for assistance if needed.    11/11/21- CSW spoke with pt's HCPOA/daughter, [name], who reports her mother is doing well-  pt has a caregiver 2 days per week which allows daughter to have some respite/support; as well, pt enjoys the caregiver.   Daughter does share they both have been dealing with runny nose they cannot get rid of- encouraged daughter to follow up with PCP if not resolved soon.  Daughter feels things are in a good place- would like to find something she can do in the home (or maybe outside the home) to improve her activity/stimulation and overall quality of life.  Pt received a "plush doggy" that has eyes that light up as a 89th birthday gift  in January. Daughter shares that she carries the toy doggy around with her in the walker and talks to it.  Validated daughter's comfort in knowing her mother is doing the best she can "at her age".   Daughter denies any current concerns and agreeable to follow up call in March. Assessment of needs, barriers , agencies contacted, as well as how impacting  Solution-Focused Strategies  Active listening / Reflection utilized  Emotional Support Provided Problem Solving /Task Center strategies reviewed Caregiver stress acknowledged  Review various resources, discussed options and provided patient information about  [employer] of [employer], site ( apply for [employer] and [employer] executive ) Personal Care Services (PCS) 1:1 collaboration with primary care provider regarding development and update of comprehensive plan of care as evidenced by provider attestation and co-signature Inter-disciplinary care team collaboration (see longitudinal plan of care) Patient Goals/Self-Care Activities: Over the next 60 days Go to  Department of Social Services [phone] to apply for Medicaid and food stamps        Follow Up Plan:  PCP to place new orders for CSW  assistance PRN      Reece Levy MSW, LCSW Licensed Clinical Social Worker [facility]   [phone]

## 2022-04-03 ENCOUNTER — Other Ambulatory Visit: Payer: Self-pay | Admitting: Family Medicine

## 2022-04-07 ENCOUNTER — Telehealth: Payer: Self-pay | Admitting: Family Medicine

## 2022-04-07 NOTE — Telephone Encounter (Signed)
Can you get more information?

## 2022-04-07 NOTE — Telephone Encounter (Signed)
Patient's daughter would like to speak with someone about her mother's recent behavior. She is concerned with possible dementia and other issues

## 2022-04-07 NOTE — Telephone Encounter (Signed)
Spoke with [name]. She states she works, pays the bills and has to take care of her mom and she is tired and about to loose it.   She states her mom will sleep on and off all day, then she will wake up and ask [name] to turn up the TV so she can hear it.  [name] tells her she doesn't need to turn up the TV because she is sleeping.  She states Ms Begeman called her into the room the other night and told her she couldn't remember what the doctor told her to do about the thing on her chest.  She didn't know what her mom was talking about so she told her mom she had to work the next day and went to bed.  The next day she talked to her mom and ask her again to tell her what she meant  about the area on her chest.  [name] thinks her mom was talking about the skin tag on her neck that Dr. Patsy Lager wouldn't remove due to it being so close to an artery.  [name] states they argue over something stupid everyday.   [name] states she just has a lot on her plate.  I advised her that I didn't feel there was a lot Dr. Patsy Lager would be able to do over these concerns but did recommend that maybe she make an appointment with her PCP to discuss her stress level at home.  [name] states that if she doesn't hear anything back from Dr. Patsy Lager, then she would call back to make an appointment with her PCP.

## 2022-04-11 ENCOUNTER — Other Ambulatory Visit: Payer: Self-pay | Admitting: Internal Medicine

## 2022-04-11 DIAGNOSIS — E782 Mixed hyperlipidemia: Secondary | ICD-10-CM | POA: Diagnosis not present

## 2022-04-11 DIAGNOSIS — I4821 Permanent atrial fibrillation: Secondary | ICD-10-CM | POA: Diagnosis not present

## 2022-06-08 ENCOUNTER — Encounter: Payer: Self-pay | Admitting: Podiatry

## 2022-06-08 ENCOUNTER — Ambulatory Visit: Payer: PPO | Admitting: Podiatry

## 2022-06-08 DIAGNOSIS — B351 Tinea unguium: Secondary | ICD-10-CM

## 2022-06-08 DIAGNOSIS — M79676 Pain in unspecified toe(s): Secondary | ICD-10-CM | POA: Diagnosis not present

## 2022-06-08 NOTE — Progress Notes (Signed)
She presents today with her daughter chief complaint of painfully elongated toenails.  Objective: Toenails are long thick yellow dystrophic clinical mycotic pulses remain palpable.  No open lesions or wounds.  Assessment: Pain in limb secondary onychomycosis.  Plan: Debridement of toenails 1 through 5 bilateral.

## 2022-06-14 ENCOUNTER — Telehealth: Payer: Self-pay | Admitting: Internal Medicine

## 2022-06-14 NOTE — Telephone Encounter (Signed)
Pt c/o medication issue:  1. Name of Medication: Rivaroxaban (XARELTO) 15 MG TABS tablet  2. How are you currently taking this medication (dosage and times per day)? As prescribed  3. Are you having a reaction (difficulty breathing--STAT)?   4. What is your medication issue?  Pt's daughter states that pt has gone into donut hole and the price has gone up considerably and they can no longer afford this medication and need to know what to do and what options they have to help pay for this medication. Daughter just picked up med today. 130 pills, but will need help for next refill.

## 2022-06-14 NOTE — Telephone Encounter (Signed)
**  Correction - Pt's daughter picked up 30 pills today, not 130.

## 2022-06-14 NOTE — Telephone Encounter (Signed)
Called patient's daughter back about message. Gave her the number for [facility]. Informed her that a message would be sent to [facility] office to see if they have samples for her to pick up, because she will run out of xarelto before the patient's next visit. She stated that patient is in the doughnut hole and there is no way she can afford the monthly cost now. Will see if she can get samples.

## 2022-06-15 ENCOUNTER — Ambulatory Visit (INDEPENDENT_AMBULATORY_CARE_PROVIDER_SITE_OTHER): Payer: PPO

## 2022-06-15 DIAGNOSIS — Z23 Encounter for immunization: Secondary | ICD-10-CM

## 2022-06-15 NOTE — Telephone Encounter (Addendum)
Patient takes Xarelto 15mg . We are currently out of Xarelto 15mg  samples. Called the drug rep Ronalee Belts. He will drop off samples this afternoon.

## 2022-06-17 NOTE — Telephone Encounter (Signed)
I called and spoke with the patient's daughter, Webb Silversmith. I have advised her that we have received some Xarelto samples and are able to give her about 3 weeks worth of samples.  I have advised Webb Silversmith, that when they pick up the samples, I am also including a brochure for [employer] that should allow the patient to pay no more than $85 out of pocket for her Xarelto to get her through the donut hole.   Webb Silversmith is also aware that we cannot sample the patient until the end of the year, so they will need to call [employer] as soon as they can.  Webb Silversmith voices understanding and is agreeable.  Samples Given: Xarelto 15 mg Lot: [id number] Exp: 04/2023 # 3 bottles

## 2022-06-17 NOTE — Telephone Encounter (Signed)
Pt daughter called stating she forgot to pick the medication up, she states she will pick them up Monday

## 2022-06-17 NOTE — Telephone Encounter (Signed)
Noted  

## 2022-06-20 NOTE — Telephone Encounter (Signed)
Pt daughter called back stating she's coming from 3-3:30 pm and wanted someone to be aware

## 2022-06-22 ENCOUNTER — Telehealth: Payer: Self-pay | Admitting: Family Medicine

## 2022-06-22 NOTE — Telephone Encounter (Signed)
Pt's daughter, [name], called stating pt has had a runny nose going on for months & she is now currently a lot lately. [name] wanted Copland to know & was wondering is there something the pt can take to stop the pt's runny nose or could something be called in? Call back # [phone]

## 2022-06-22 NOTE — Telephone Encounter (Signed)
Left message for [name] that per Dr. Lorelei Pont, he suspects that this is coming from fall allergy season.  She can try some Flonase and Atrovent nasal spray that he has given he in the past.   She could also try an over-the-counter Zyrtec to see if this helps, as well.  Aside from this, there is really not much else she can do for runny nose or allergies.  I ask that [name] call me back if she has any questions.

## 2022-06-22 NOTE — Telephone Encounter (Signed)
Please call.  My suspicion is that the fall allergy season is probably playing a role.  I have given her some Flonase as well as some Atrovent (ipratropium nasa) before, and I would try both of these.  She could also try an over-the-counter Zyrtec to see if this helps, as well.  Aside from this, there is really not much else she can do for runny nose or allergies.

## 2022-06-22 NOTE — Telephone Encounter (Signed)
Patient daughter Paula Luna called back in and relayed message below. She stated they have tried the nasal spray but will try the Zyrtec and if it doesn't work she will call back.

## 2022-07-02 ENCOUNTER — Other Ambulatory Visit: Payer: Self-pay | Admitting: Internal Medicine

## 2022-07-14 ENCOUNTER — Other Ambulatory Visit: Payer: Self-pay | Admitting: Internal Medicine

## 2022-07-14 DIAGNOSIS — I4821 Permanent atrial fibrillation: Secondary | ICD-10-CM

## 2022-07-14 NOTE — Telephone Encounter (Signed)
Prescription refill request for Xarelto received.  Indication:Afib Last office visit:5/23 Weight:50.8 kg Age:88 Scr:1.1 CrCl:24.53  ml/min  Prescription refilled

## 2022-07-14 NOTE — Telephone Encounter (Signed)
Refill Request.  

## 2022-07-15 ENCOUNTER — Telehealth: Payer: Self-pay

## 2022-07-15 NOTE — Chronic Care Management (AMB) (Signed)
    Chronic Care Management Pharmacy Assistant   Name: Paula Luna  MRN: [id number] DOB: 04-Jul-1925   Reason for Encounter: Reminder Call   Conditions to be addressed/monitored: Atrial Fibrillation, HTN, and CKD Stage 3  Recent office visits:  03/24/22-Paula Copland,MD(PCP)-gastric reflux,continue PPI  Recent consult visits:  06/08/22-Paula Luna,DPM-Toenail trimming  01/26/22-Paula End,MD(cardio)-f/u Afib.continue xarelto,continue with leg elevation and compression hose, no medication changes, f/u 6 months   Hospital visits:  None in previous 6 months  Medications: Outpatient Encounter Medications as of 07/15/2022  Medication Sig   Cholecalciferol (VITAMIN D) 50 MCG (2000 UT) CAPS Take 1 capsule by mouth daily.   docusate sodium (COLACE) 100 MG capsule Take 100 mg by mouth daily.   fluticasone (FLONASE) 50 MCG/ACT nasal spray Place 2 sprays into both nostrils daily.   furosemide (LASIX) 20 MG tablet Take 20mg  3 days per week PRN   ipratropium (ATROVENT) 0.06 % nasal spray Place 2 sprays into both nostrils 4 (four) times daily.   lisinopril (ZESTRIL) 40 MG tablet Take 1 tablet by mouth once daily   Multiple Vitamins-Minerals (ICAPS PO) Take 1 capsule by mouth daily.   pantoprazole (PROTONIX) 40 MG tablet Take 1 tablet by mouth once daily   XARELTO 15 MG TABS tablet TAKE 1 TABLET BY MOUTH EVERY DAY WITH SUPPER   No facility-administered encounter medications on file as of 07/15/2022.    Paula Luna was contacted to remind of upcoming telephone visit with Paula Luna on 07/20/22 at 1:30. Patient was reminded to have any blood glucose and blood pressure readings available for review at appointment.   Patient confirmed appointment. Daughter Paula Luna confirmed  Are you having any problems with your medications? No   Do you have any concerns you like to discuss with the pharmacist? No  CCM referral has been placed prior to visit?  Yes   Star Rating  Drugs: Medication:  Last Fill: Day Supply Lisinopril 40mg  07/05/22 [place], CPP notified  Avel Sensor, [place]  [phone]

## 2022-07-20 ENCOUNTER — Telehealth: Payer: PPO

## 2022-07-20 ENCOUNTER — Ambulatory Visit: Payer: PPO | Admitting: Pharmacist

## 2022-07-20 DIAGNOSIS — I10 Essential (primary) hypertension: Secondary | ICD-10-CM

## 2022-07-20 DIAGNOSIS — I4821 Permanent atrial fibrillation: Secondary | ICD-10-CM

## 2022-07-20 DIAGNOSIS — K219 Gastro-esophageal reflux disease without esophagitis: Secondary | ICD-10-CM

## 2022-07-20 DIAGNOSIS — N1831 Chronic kidney disease, stage 3a: Secondary | ICD-10-CM

## 2022-07-20 NOTE — Progress Notes (Signed)
Chronic Care Management Pharmacy Note  07/20/2022 Name:  Paula Luna MRN:  [id number] DOB:  March 03, 1925  Summary: CCM F/U visit -Reviewed medications; pt affirms compliance as prescribed -Pt gets Xarelto through discount program [place]; she denies bleeding -Pt reports chronic runny nose (for months now); no benefit with Flonase, Atrovent, or cetirizine though she has not used these all at once at not on a daily basis  Recommendations/Changes made from today's visit: -Advised combo therapy for rhinitis - nasal spray BID and cetirizine daily -Consider allergy specialist or ENT if rhinitis does not improve  Plan: -[place] will call patient 3 months for general update -Pharmacist follow up PRN -Cardiology appt 07/27/22 -PCP annual due Jan 2024 (not scheduled)    Subjective: Paula Luna is an 88 y.o. year old female who is a primary patient of Copland, Frederico Hamman, MD.  The CCM team was consulted for assistance with disease management and care coordination needs.    Engaged with patient by telephone for follow up visit in response to provider referral for pharmacy case management and/or care coordination services.   Consent to Services:  The patient was given information about Chronic Care Management services, agreed to services, and Luna verbal consent prior to initiation of services.  Please see initial visit note for detailed documentation.   Patient Care Team: Paula Loffler, MD as PCP - General End, Harrell Gave, MD as PCP - Cardiology (Cardiology) Paula Luna, [facility] as Pharmacist (Pharmacist)   Patient lives with her daughter Paula Luna. All appointments need to be on Wednesdays as that is Paula Luna's day off work.  Recent office visits: 03/24/22 Dr Paula Luna OV: GERD - continue PPI  Recent consult visits: 06/08/22-Paula Luna,DPM - Toenail trimming   01/26/22 Dr End (Cardiology): f/u Afib.continue xarelto,continue with leg elevation and compression  hose, no medication changes, f/u 6 months   07/29/21 Dr End (Cardiology): recommended changing Xarelto to Eliquis 2.5 mg in light of nosebleeds and fluctuating kidney function. Ultimately pt decided to stay on Xarelto as nosebleeds resolved after seeing ENT.  Hospital visits: None in previous 6 months   Objective:  Lab Results  Component Value Date   CREATININE 1.11 09/29/2021   BUN 17 09/29/2021   GFR 42.41 (L) 09/29/2021   EGFR 68 07/29/2021   GFRNONAA 58 (L) 04/05/2021   GFRAA >60 08/27/2019   NA 136 09/29/2021   K 4.2 09/29/2021   CALCIUM 9.1 09/29/2021   CO2 26 09/29/2021   GLUCOSE 82 09/29/2021    Lab Results  Component Value Date/Time   HGBA1C 5.8 02/18/2019 09:25 AM   HGBA1C 5.5 03/19/2017 04:17 AM   GFR 42.41 (L) 09/29/2021 01:05 PM   GFR 71.74 11/05/2020 02:22 PM    Last diabetic Eye exam: No results found for: "HMDIABEYEEXA"  Last diabetic Foot exam: No results found for: "HMDIABFOOTEX"   Lab Results  Component Value Date   CHOL 155 09/29/2021   HDL 44.50 09/29/2021   LDLCALC 81 09/29/2021   LDLDIRECT 101.2 05/20/2009   TRIG 145.0 09/29/2021   CHOLHDL 3 09/29/2021       Latest Ref Rng & Units 09/29/2021    1:05 PM 04/02/2021    1:22 PM 11/05/2020    2:22 PM  Hepatic Function  Total Protein 6.0 - 8.3 g/dL 6.9  7.3  6.4   Albumin 3.5 - 5.2 g/dL 3.9  4.0  3.6   AST 0 - 37 U/L _0 ALT 0 -  35 U/L 32  9  15   Alk Phosphatase 39 - 117 U/L 103  92  83   Total Bilirubin 0.2 - 1.2 mg/dL 0.6  0.9  0.4   Bilirubin, Direct 0.0 - 0.3 mg/dL 0.1   0.1     Lab Results  Component Value Date/Time   TSH 2.12 03/18/2020 10:07 AM   TSH 1.29 02/13/2018 12:54 PM       Latest Ref Rng & Units 09/29/2021    1:05 PM 07/29/2021   10:22 AM 04/05/2021    4:41 AM  CBC  WBC 4.0 - 10.5 K/uL 5.8  4.9  5.2   Hemoglobin 12.0 - 15.0 g/dL 10.9  12.8  11.7   Hematocrit 36.0 - 46.0 % 33.9  39.7  36.7   Platelets 150.0 - 400.0 K/uL 176.0  206  171     Lab Results   Component Value Date/Time   VD25OH 52.40 03/18/2020 10:07 AM   VD25OH 41.08 02/18/2019 09:25 AM    Clinical ASCVD: No  The ASCVD Risk score (Arnett DK, et al., 2019) failed to calculate for the following reasons:   The 2019 ASCVD risk score is only valid for ages 57 to 19       09/29/2021   12:13 PM 03/25/2020   11:34 AM 02/18/2019    8:57 PM  Depression screen PHQ 2/9  Decreased Interest 0 0 0  Down, Depressed, Hopeless 0 0 0  PHQ - 2 Score 0 0 0  Altered sleeping   0  Tired, decreased energy   0  Change in appetite   0  Feeling bad or failure about yourself    0  Trouble concentrating   0  Moving slowly or fidgety/restless   0  Suicidal thoughts   0  PHQ-9 Score   0  Difficult doing work/chores   Not difficult at all    CHA2DS2/VAS Stroke Risk Points  Current as of 4 days ago     4 >= 2 Points: High Risk  1 - 1.99 Points: Medium Risk  0 Points: Low Risk    No Change     Points Metrics  0 Has Congestive Heart Failure:  No    Current as of 4 days ago  0 Has Vascular Disease:  No    Current as of 4 days ago  1 Has Hypertension:  Yes    Current as of 4 days ago  2 Age:  88    Current as of 4 days ago  0 Has Diabetes:  No    Current as of 4 days ago  0 Had Stroke:  No  Had TIA:  No  Had Thromboembolism:  No    Current as of 4 days ago  1 Female:  Yes    Current as of 4 days ago    Social History   Tobacco Use  Smoking Status Former  Smokeless Tobacco Never   BP Readings from Last 3 Encounters:  03/24/22 130/70  01/26/22 (!) 150/88  09/29/21 130/80   Pulse Readings from Last 3 Encounters:  03/24/22 75  01/26/22 86  09/29/21 71   Wt Readings from Last 3 Encounters:  03/24/22 112 lb (50.8 kg)  01/26/22 114 lb (51.7 kg)  09/29/21 119 lb 4 oz (54.1 kg)   BMI Readings from Last 3 Encounters:  03/24/22 23.41 kg/m  01/26/22 23.83 kg/m  09/29/21 24.92 kg/m    Assessment/Interventions: Review of patient past medical history, allergies, medications,  health status, including review of consultants reports, laboratory and other test data, was performed as part of comprehensive evaluation and provision of chronic care management services.   SDOH:  (Social Determinants of Health) assessments and interventions performed: No SDOH Interventions    Flowsheet Row Chronic Care Management from 07/28/2021 in [place] [facility] [place] Management from 07/19/2021 in [place] [facility] [place] from 02/18/2019 in [place] [facility] [place] Interventions     Food Insecurity Interventions Intervention Not Indicated -- --  Transportation Interventions Intervention Not Indicated -- --  Depression Interventions/Treatment  -- -- SAY3-0 Score <4 Follow-up Not Indicated  Financial Strain Interventions -- Intervention Not Indicated --      [place]: No Food Insecurity (07/28/2021)  Transportation Needs: No Transportation Needs (07/28/2021)  Depression (PHQ2-9): Low Risk  (09/29/2021)  Financial Resource Strain: Medium Risk (07/19/2021)  Tobacco Use: Medium Risk (06/08/2022)    CCM Care Plan  Allergies  Allergen Reactions   Latex Rash   Tetanus Toxoid Anaphylaxis    REACTION: anaphylaxis   Tramadol Other (See Comments)    Caused severe constipation---bowel obstruction   Alendronate Sodium     REACTION: passed out    Medications Reviewed Today     Reviewed by Paula Luna, [facility] (Pharmacist) on 07/20/22 at 1351  Med List Status: <None>   Medication Order Taking? Sig Documenting Provider Last Dose Status Informant  cetirizine (ZYRTEC) 10 MG tablet [id number] Yes Take 10 mg by mouth daily. [provider] Taking Active   Cholecalciferol (VITAMIN D) 50 MCG (2000 UT) CAPS [id number] Yes Take 1 capsule by mouth daily. [provider] Taking Active Child  docusate sodium (COLACE) 100 MG capsule [id number] Yes Take 100 mg by mouth daily. [provider] Taking Active Child  fluticasone (FLONASE) 50 MCG/ACT nasal spray [id number] Yes Place 2 sprays into both nostrils daily. [provider] Taking Active   furosemide (LASIX) 20 MG tablet [id number] Yes Take 17m 3 days per week PRN [provider] Taking Active   ipratropium (ATROVENT) 0.06 % nasal spray [id number] Place 2 sprays into both nostrils 4 (four) times daily. Copland, SFrederico Hamman MD Taking Active   lisinopril (ZESTRIL) 40 MG tablet [id number] Take 1 tablet by mouth once daily End, Christopher, MD Taking Active   Multiple Vitamins-Minerals (ICAPS PO) [id number] Take 1 capsule by mouth daily. [provider] Taking Active   pantoprazole (PROTONIX) 40 MG tablet [id number] Take 1 tablet by mouth once daily Copland, Spencer, MD Taking Active   XARELTO 15 MG TABS tablet [id number] TAKE 1 TABLET BY MOUTH EVERY DAY WITH SUPPER End, CHarrell Gave MD Taking Active             Patient Active Problem List   Diagnosis Date Noted   CKD (chronic kidney disease) stage 3, GFR 30-59 ml/min ([place] 10/04/2021   Leg edema 05/01/2021   SBO (small bowel obstruction) ([place] 10/01/2020   Permanent atrial fibrillation ([place] 04/26/2017   Ischemic bowel disease ([place] 03/18/2017   Macular degeneration of left eye 01/06/2015   Essential hypertension 12/25/2013    Immunization History  Administered Date(s) Administered   Fluad Quad(high Dose 65+) 06/15/2020, 06/18/2021, 06/15/2022   Influenza Split 07/12/2012, 06/28/2013   Influenza Whole 08/20/2007, 05/10/2010   Influenza, High Dose Seasonal PF 08/14/2014, 07/29/2015, 05/22/2016, 06/10/2017, 06/20/2018   Influenza,inj,Quad PF,6+ Mos 05/07/2019   PFIZER(Purple Top)SARS-COV-2 Vaccination 11/29/2019, 12/20/2019   Pneumococcal Conjugate-13  12/25/2013   Pneumococcal Polysaccharide-23 05/10/2010   Zoster Recombinat (Shingrix) 03/06/2018   Zoster, Live 10/15/2012    Conditions to be addressed/monitored:   Hypertension, Atrial Fibrillation, and Chronic Kidney Disease  Care Plan : [place]  Updates made by Paula Luna, [place] since 07/20/2022 12:00 AM     Problem: Hypertension, Atrial Fibrillation, and Chronic Kidney Disease   Priority: High     Long-Range Goal: Disease mgmt   Start Date: 07/19/2021  Expected End Date: 07/20/2022  This Visit's Progress: On track  Priority: High  Note:   Current Barriers:  None identified  Pharmacist Clinical Goal(s):  Patient will contact provider office for questions/concerns as evidenced notation of same in electronic health record through collaboration with PharmD and provider.   Interventions: 1:1 collaboration with Paula Loffler, MD regarding development and update of comprehensive plan of care as evidenced by provider attestation and co-signature Inter-disciplinary care team collaboration (see longitudinal plan of care) Comprehensive medication review performed; medication list updated in electronic medical record  Hypertension (BP goal <140/90) -Controlled - per clinic readings; Denies hypotensive/hypertensive symptoms -Current home readings: none available -Current treatment: Lisinopril 40 mg daily - Appropriate, Effective, Safe, Accessible Furosemide 20 mg 3x weekly PRN - no recent fills -Medications previously tried: n/a  -Educated on BP goals and benefits of medications for prevention of heart attack, stroke and kidney damage; -Counseled to monitor BP at home periodically -Recommended to continue current medication  Atrial Fibrillation (Goal: prevent stroke and major bleeding) -Controlled - per cardiology. Denies bleeding issues (hx of nosebleeds); -CHADSVASC: 4 -Current treatment: Xarelto 15 mg daily (XWM) - Appropriate, Effective, Safe, Accessible -Medications previously tried: n/a -Counseled on bleeding risk associated with Xarelto and importance of self-monitoring for signs/symptoms of bleeding; avoidance  of NSAIDs due to increased bleeding risk with anticoagulants; -Recommended to continue current medication  Chronic Kidney Disease Stage 3b  -All medications assessed for renal dosing and appropriateness in chronic kidney disease. -Recommended to continue current medication  GERD (Goal: minimize symptoms of reflux ) -Controlled -Hx of Bleeds/ulcers: No -Current treatment  Pantoprazole 40 mg daily - Appropriate, Effective, Safe, Accessible -Medications previously tried: none reported  -Recommended to continue current medication  Health Maintenance -Vaccine gaps: Shingrix #2 -Concern for dementia -Runny nose for months; nasal sprays and OTC allergy medications have not helped at all but she is not taking them consistenly. Advised combo therapy with nasal spray (2 sprays BID) plus cetirizine daily. -Current therapy:  Docusate 100 mg Vitamin D 2000 IU Preservision Flonase Atrovent Cetirizine 10 mg - not taking daily   Patient Goals/Self-Care Activities Patient will:  - take medications as prescribed as evidenced by patient report and record review focus on medication adherence by routine      Medication Assistance:  Xarelto - XWM discount program  Compliance/Adherence/Medication fill history: Care Gaps: None  Star-Rating Drugs: Lisinopril - PDC 99%  Medication Access: Within the past 30 days, how often has patient missed a dose of medication? 0 Is a pillbox or other method used to improve adherence? No  Factors that may affect medication adherence? no barriers identified Are meds synced by current pharmacy? No  Are meds delivered by current pharmacy? No  Does patient experience delays in picking up medications due to transportation concerns? No   Upstream Services Reviewed: Is patient disadvantaged to use [facility]?: No  Current Rx insurance plan: HTA Name and location of Current pharmacy:  [facility] [place], [address]  [place] [address] Phone: [phone] Fax: [phone]  [facility] Pharmacy services reviewed with patient today?: No  Patient requests to transfer care to Upstream Pharmacy?: No  Reason patient declined to change pharmacies: Not mentioned at this visit   Care Plan and Follow Up Patient Decision:  Patient agrees to Care Plan and Follow-up.  Plan: The patient has been provided with contact information for the care management team and has been advised to call with any health related questions or concerns.   [name], PharmD, BCACP Clinical Pharmacist [facility] at [facility] [phone]

## 2022-07-20 NOTE — Patient Instructions (Signed)
Visit Information  Phone number for Pharmacist: [phone]   Goals Addressed   None     Care Plan : CCM Pharmacy Care Plan  Updates made by Kathyrn Sheriff, RPH since 07/20/2022 12:00 AM     Problem: Hypertension, Atrial Fibrillation, and Chronic Kidney Disease   Priority: High     Long-Range Goal: Disease mgmt   Start Date: 07/19/2021  Expected End Date: 07/20/2022  This Visit's Progress: On track  Priority: High  Note:   Current Barriers:  None identified  Pharmacist Clinical Goal(s):  Patient will contact provider office for questions/concerns as evidenced notation of same in electronic health record through collaboration with PharmD and provider.   Interventions: 1:1 collaboration with Hannah Beat, MD regarding development and update of comprehensive plan of care as evidenced by provider attestation and co-signature Inter-disciplinary care team collaboration (see longitudinal plan of care) Comprehensive medication review performed; medication list updated in electronic medical record  Hypertension (BP goal <140/90) -Controlled - per clinic readings; Denies hypotensive/hypertensive symptoms -Current home readings: none available -Current treatment: Lisinopril 40 mg daily - Appropriate, Effective, Safe, Accessible Furosemide 20 mg 3x weekly PRN - no recent fills -Medications previously tried: n/a  -Educated on BP goals and benefits of medications for prevention of heart attack, stroke and kidney damage; -Counseled to monitor BP at home periodically -Recommended to continue current medication  Atrial Fibrillation (Goal: prevent stroke and major bleeding) -Controlled - per cardiology. Denies bleeding issues (hx of nosebleeds); -CHADSVASC: 4 -Current treatment: Xarelto 15 mg daily (XWM) - Appropriate, Effective, Safe, Accessible -Medications previously tried: n/a -Counseled on bleeding risk associated with Xarelto and importance of self-monitoring for  signs/symptoms of bleeding; avoidance of NSAIDs due to increased bleeding risk with anticoagulants; -Recommended to continue current medication  Chronic Kidney Disease Stage 3b  -All medications assessed for renal dosing and appropriateness in chronic kidney disease. -Recommended to continue current medication  GERD (Goal: minimize symptoms of reflux ) -Controlled -Hx of Bleeds/ulcers: No -Current treatment  Pantoprazole 40 mg daily - Appropriate, Effective, Safe, Accessible -Medications previously tried: none reported  -Recommended to continue current medication  Health Maintenance -Vaccine gaps: Shingrix #2 -Concern for dementia -Runny nose for months; nasal sprays and OTC allergy medications have not helped at all but she is not taking them consistenly. Advised combo therapy with nasal spray (2 sprays BID) plus cetirizine daily. -Current therapy:  Docusate 100 mg Vitamin D 2000 IU Preservision Flonase Atrovent Cetirizine 10 mg - not taking daily   Patient Goals/Self-Care Activities Patient will:  - take medications as prescribed as evidenced by patient report and record review focus on medication adherence by routine      Patient verbalizes understanding of instructions and care plan provided today and agrees to view in MyChart. Active MyChart status and patient understanding of how to access instructions and care plan via MyChart confirmed with patient.    Telephone follow up appointment with pharmacy team member scheduled for: PRN  Al Corpus, PharmD, BCACP Clinical Pharmacist [facility] at [facility] [phone]

## 2022-07-27 ENCOUNTER — Encounter: Payer: Self-pay | Admitting: Internal Medicine

## 2022-07-27 ENCOUNTER — Ambulatory Visit: Payer: PPO | Attending: Internal Medicine | Admitting: Internal Medicine

## 2022-07-27 ENCOUNTER — Other Ambulatory Visit
Admission: RE | Admit: 2022-07-27 | Discharge: 2022-07-27 | Disposition: A | Discharge status: Home / Self Care | Payer: PPO | Source: Ambulatory Visit | Attending: Internal Medicine | Admitting: Internal Medicine

## 2022-07-27 VITALS — BP 120/80 | Ht <= 58 in | Wt 111.0 lb

## 2022-07-27 DIAGNOSIS — M7989 Other specified soft tissue disorders: Secondary | ICD-10-CM

## 2022-07-27 DIAGNOSIS — I4821 Permanent atrial fibrillation: Secondary | ICD-10-CM | POA: Insufficient documentation

## 2022-07-27 DIAGNOSIS — K5900 Constipation, unspecified: Secondary | ICD-10-CM | POA: Diagnosis not present

## 2022-07-27 DIAGNOSIS — Z13220 Encounter for screening for lipoid disorders: Secondary | ICD-10-CM

## 2022-07-27 DIAGNOSIS — I10 Essential (primary) hypertension: Secondary | ICD-10-CM

## 2022-07-27 DIAGNOSIS — R0989 Other specified symptoms and signs involving the circulatory and respiratory systems: Secondary | ICD-10-CM | POA: Diagnosis not present

## 2022-07-27 LAB — COMPREHENSIVE METABOLIC PANEL
ALT: 11 U/L (ref 0–44)
AST: 19 U/L (ref 15–41)
Albumin: 3.7 g/dL (ref 3.5–5.0)
Alkaline Phosphatase: 78 U/L (ref 38–126)
Anion gap: 9 (ref 5–15)
BUN: 15 mg/dL (ref 8–23)
CO2: 27 mmol/L (ref 22–32)
Calcium: 9.5 mg/dL (ref 8.9–10.3)
Chloride: 106 mmol/L (ref 98–111)
Creatinine, Ser: 0.86 mg/dL (ref 0.44–1.00)
GFR, Estimated: >60 mL/min (ref >60)
Glucose, Bld: 110 mg/dL — ABNORMAL HIGH (ref 70–99)
Potassium: 4.6 mmol/L (ref 3.5–5.1)
Sodium: 142 mmol/L (ref 135–145)
Total Bilirubin: 0.8 mg/dL (ref 0.3–1.2)
Total Protein: 6.8 g/dL (ref 6.5–8.1)

## 2022-07-27 LAB — CBC
HCT: 39.2 % (ref 36.0–46.0)
Hemoglobin: 12.4 g/dL (ref 12.0–15.0)
MCH: 24.7 pg — ABNORMAL LOW (ref 26.0–34.0)
MCHC: 31.6 g/dL (ref 30.0–36.0)
MCV: 78.1 fL — ABNORMAL LOW (ref 80.0–100.0)
Platelets: 205 10*3/uL (ref 150–400)
RBC: 5.02 MIL/uL (ref 3.87–5.11)
RDW: 15.7 % — ABNORMAL HIGH (ref 11.5–15.5)
WBC: 5 10*3/uL (ref 4.0–10.5)
nRBC: 0 % (ref 0.0–0.2)

## 2022-07-27 LAB — LIPID PANEL
Cholesterol: 182 mg/dL (ref 0–200)
HDL: 78 mg/dL (ref >40)
LDL Cholesterol: 92 mg/dL (ref 0–99)
Total CHOL/HDL Ratio: 2.3 RATIO
Triglycerides: 59 mg/dL (ref <150)
VLDL: 12 mg/dL (ref 0–40)

## 2022-07-27 NOTE — Patient Instructions (Signed)
Medication Instructions:   Your physician recommends that you continue on your current medications as directed. Please refer to the Current Medication list given to you today.   *If you need a refill on your cardiac medications before your next appointment, please call your pharmacy*   Lab Work:  Please go to the [facility] after your appointment today for a CBC, CMP, and a Lipid panel lab draw.     Follow-Up: At [facility], you and your health needs are our priority.  As part of our continuing mission to provide you with exceptional heart care, we have created designated Provider Care Teams.  These Care Teams include your primary Cardiologist (physician) and Advanced Practice Providers (APPs -  Physician Assistants and Nurse Practitioners) who all work together to provide you with the care you need, when you need it.  We recommend signing up for the patient portal called "MyChart".  Sign up information is provided on this After Visit Summary.  MyChart is used to connect with patients for Virtual Visits (Telemedicine).  Patients are able to view lab/test results, encounter notes, upcoming appointments, etc.  Non-urgent messages can be sent to your provider as well.   To learn more about what you can do with MyChart, go to [email or web address].    Your next appointment:   6 month(s)  The format for your next appointment:   In Person  Provider:   You may see Yvonne Kendall, MD or one of the following Advanced Practice Providers on your designated Care Team:   Nicolasa Ducking, NP Eula Listen, PA-C Cadence Fransico Michael, PA-C Charlsie Quest, NP    Other Instructions   Important Information About Sugar

## 2022-07-27 NOTE — Progress Notes (Unsigned)
Follow-up Outpatient Visit Date: 07/27/2022  Primary Care Provider: Owens Loffler, MD [place] [address]  Chief Complaint: Follow-up atrial fibrillation  HPI:  Paula Luna is a 88 y.o. female with history of permanent atrial fibrillation, hypertension, ischemic bowel status post bowel resection with recent hospitalization for SBO, osteoporosis, and anemia, who presents for follow-up of atrial fibrillation.  I last saw her in May, at which time she was doing fairly well but complained of more leg edema (attributed to venous insufficiency).  She had not had any further epistaxis after cauterization by Dr. Tami Ribas.  She continued to exercise some and reported sporadic palpitations after activity.  She also had transient orthostatic lightheadedness when getting up at night.  She had not fallen or passed out.  We did not make any medication changes or pursue additional testing.  Today, Paula Luna reports that she has been feeling fairly well.  She continues to have some bowel issues but is using a stool softener to help prevent constipation.  She notes some swelling and purplish discoloration in her feet and toes at times, though this resolves with elevation.  She does not have any pain in her legs.  She also denies chest pain, shortness of breath, and palpitations.  She remains compliant with rivaroxaban.  She has not fallen or noticed any bleeding.  --------------------------------------------------------------------------------------------------  Past Medical History:  Diagnosis Date   CKD (chronic kidney disease) stage 3, GFR 30-59 ml/min ([place]) 10/04/2021   Hypertension 12/25/2013   Ischemic bowel disease ([place])    a. 03/2017 abd pain-->internal herina and ischemic jejunum s/p ex lap and small bowel resection.   Macular degeneration of left eye 01/06/2015   Osteoporosis    Permanent atrial fibrillation ([place])    a. First noted 03/2017;  b. 07/2017 Echo: EF 60-65%, no  rwma, mild MR, mildly to mod dil LA; c. 07/2017 48hr Holter: persistent Afib, avg HR of 66 (range 39-140 bpm);  d. CHA2DS2VASc = 4-->Xarelto initiated 07/2017.   Past Surgical History:  Procedure Laterality Date   CLOSED REDUCTION PROXIMAL HUMERUS FRACTURE Left 2017   LAPAROSCOPIC SMALL BOWEL RESECTION     LAPAROTOMY N/A 03/18/2017   Procedure: EXPLORATORY LAPAROTOMY, SMALL BOWEL RESECTION;  Surgeon: Jules Husbands, MD;  Location: [facility] ORS;  Service: General;  Laterality: N/A;   TOTAL ABDOMINAL HYSTERECTOMY      Current Meds  Medication Sig   cetirizine (ZYRTEC) 10 MG tablet Take 10 mg by mouth daily.   Cholecalciferol (VITAMIN D) 50 MCG (2000 UT) CAPS Take 1 capsule by mouth daily.   docusate sodium (COLACE) 100 MG capsule Take 100 mg by mouth daily.   fluticasone (FLONASE) 50 MCG/ACT nasal spray Place 2 sprays into both nostrils daily.   furosemide (LASIX) 20 MG tablet Take 20mg  3 days per week PRN   ipratropium (ATROVENT) 0.06 % nasal spray Place 2 sprays into both nostrils 4 (four) times daily.   lisinopril (ZESTRIL) 40 MG tablet Take 1 tablet by mouth once daily   Multiple Vitamins-Minerals (ICAPS PO) Take 1 capsule by mouth daily.   pantoprazole (PROTONIX) 40 MG tablet Take 1 tablet by mouth once daily   XARELTO 15 MG TABS tablet TAKE 1 TABLET BY MOUTH EVERY DAY WITH SUPPER    Allergies: Latex, Tetanus toxoid, Tramadol, and Alendronate sodium  Social History   Tobacco Use   Smoking status: Former   Smokeless tobacco: Never  Vaping Use   Vaping Use: Never used  Substance Use  Topics   Alcohol use: No   Drug use: No    Family History  Problem Relation Age of Onset   Diabetes Mother    Cancer Father        jaw    Review of Systems: A 12-system review of systems was performed and was negative except as noted in the HPI.  --------------------------------------------------------------------------------------------------  Physical Exam: BP 120/80 (BP Location: Left  Arm, Patient Position: Sitting, Cuff Size: Normal)   Ht 4\' 10"  (1.473 m)   Wt 111 lb (50.3 kg)   BMI 23.20 kg/m   General:  NAD. Neck: No JVD or HJR. Lungs: Clear to auscultation bilaterally without wheezes or crackles. Heart: Irregularly irregular rhythm with 1/6 systolic murmur.  No rubs or gallops. Abdomen: Soft with mild left lower quadrant tenderness.  No rebound or guarding. Extremities: Varicose veins noted with trace pretibial edema bilaterally.  Pedal pulses are 1+ bilaterally.  EKG: Atrial fibrillation with borderline LVH and poor R wave progression in V1 and V2.  No significant change from prior tracing on 01/26/2022.  Lab Results  Component Value Date   WBC 5.0 07/27/2022   HGB 12.4 07/27/2022   HCT 39.2 07/27/2022   MCV 78.1 (L) 07/27/2022   PLT 205 07/27/2022    Lab Results  Component Value Date   NA 142 07/27/2022   K 4.6 07/27/2022   CL 106 07/27/2022   CO2 27 07/27/2022   BUN 15 07/27/2022   CREATININE 0.86 07/27/2022   GLUCOSE 110 (H) 07/27/2022   ALT 11 07/27/2022    Lab Results  Component Value Date   CHOL 182 07/27/2022   HDL 78 07/27/2022   LDLCALC 92 07/27/2022   LDLDIRECT 101.2 05/20/2009   TRIG 59 07/27/2022   CHOLHDL 2.3 07/27/2022    --------------------------------------------------------------------------------------------------  ASSESSMENT AND PLAN: Permanent atrial fibrillation: [name] remains adequately rate controlled without any AV nodal blocking agents and is asymptomatic.  Continue anticoagulation with rivaroxaban 15 mg daily.  Samples provided today to help ensure that she has adequate supply until the new year when she is no longer in the donut hole.  We will check a CBC and CMP today.  Hypertension: Blood pressure well controlled today.  Continue lisinopril 40 mg daily.  Leg swelling and decreased pedal pulses: Chronic and most likely driven by venous insufficiency.  Continue using furosemide as needed.  Leg elevation  encouraged.  We discussed obtaining ABIs to exclude concurrent arterial insufficiency, but given lack of claudication or leg wounds, she would not qualify for intervention anyway.  We will check her lipids today for risk assessment.  Otherwise, we will defer additional testing unless she becomes symptomatic.  Constipation: Continue using stool softener.  I encouraged use of a a laxative such as MiraLAX as needed as well to help prevent constipation as this has led to serious bowel problems in the past.  Follow-up: Return to clinic in 6 months.  Sofie Hartigan, MD 07/28/2022 7:45 AM

## 2022-07-28 ENCOUNTER — Encounter: Payer: Self-pay | Admitting: Internal Medicine

## 2022-07-28 DIAGNOSIS — M7989 Other specified soft tissue disorders: Secondary | ICD-10-CM | POA: Insufficient documentation

## 2022-07-28 DIAGNOSIS — R0989 Other specified symptoms and signs involving the circulatory and respiratory systems: Secondary | ICD-10-CM | POA: Insufficient documentation

## 2022-08-03 DIAGNOSIS — H353134 Nonexudative age-related macular degeneration, bilateral, advanced atrophic with subfoveal involvement: Secondary | ICD-10-CM | POA: Diagnosis not present

## 2022-08-03 DIAGNOSIS — H43393 Other vitreous opacities, bilateral: Secondary | ICD-10-CM | POA: Diagnosis not present

## 2022-08-03 DIAGNOSIS — H02831 Dermatochalasis of right upper eyelid: Secondary | ICD-10-CM | POA: Diagnosis not present

## 2022-08-03 DIAGNOSIS — Z961 Presence of intraocular lens: Secondary | ICD-10-CM | POA: Diagnosis not present

## 2022-08-03 DIAGNOSIS — H0100A Unspecified blepharitis right eye, upper and lower eyelids: Secondary | ICD-10-CM | POA: Diagnosis not present

## 2022-08-03 DIAGNOSIS — H35033 Hypertensive retinopathy, bilateral: Secondary | ICD-10-CM | POA: Diagnosis not present

## 2022-08-03 DIAGNOSIS — H3554 Dystrophies primarily involving the retinal pigment epithelium: Secondary | ICD-10-CM | POA: Diagnosis not present

## 2022-08-03 DIAGNOSIS — H353114 Nonexudative age-related macular degeneration, right eye, advanced atrophic with subfoveal involvement: Secondary | ICD-10-CM | POA: Insufficient documentation

## 2022-08-03 DIAGNOSIS — H524 Presbyopia: Secondary | ICD-10-CM | POA: Diagnosis not present

## 2022-08-03 DIAGNOSIS — H02834 Dermatochalasis of left upper eyelid: Secondary | ICD-10-CM | POA: Diagnosis not present

## 2022-08-03 DIAGNOSIS — H0100B Unspecified blepharitis left eye, upper and lower eyelids: Secondary | ICD-10-CM | POA: Diagnosis not present

## 2022-08-03 DIAGNOSIS — H04123 Dry eye syndrome of bilateral lacrimal glands: Secondary | ICD-10-CM | POA: Diagnosis not present

## 2022-08-03 DIAGNOSIS — H52201 Unspecified astigmatism, right eye: Secondary | ICD-10-CM | POA: Diagnosis not present

## 2022-08-10 ENCOUNTER — Telehealth: Payer: Self-pay | Admitting: Family Medicine

## 2022-08-10 NOTE — Telephone Encounter (Signed)
Patient came in office and dropped off Placard paperwork for PCP to fill out and has been placed in PCP folder. Call back number [phone].

## 2022-08-10 NOTE — Telephone Encounter (Signed)
Handicap Placard Application placed in Dr. Copland's office in box to complete. 

## 2022-08-10 NOTE — Telephone Encounter (Signed)
done

## 2022-08-10 NOTE — Telephone Encounter (Signed)
Left message for Paula Luna that handicap placard application is ready to be picked up at the front desk.

## 2022-09-07 ENCOUNTER — Ambulatory Visit: Payer: PPO | Admitting: Podiatry

## 2022-09-20 ENCOUNTER — Encounter: Payer: Self-pay | Admitting: *Deleted

## 2022-09-20 ENCOUNTER — Telehealth: Payer: Self-pay | Admitting: Family Medicine

## 2022-09-20 NOTE — Telephone Encounter (Signed)
MyChart message sent to patient as Paula Luna requested.

## 2022-09-20 NOTE — Telephone Encounter (Signed)
Please call or send mychart  It is hard for me to know without more information.  Is she talking about a nasal spray?  That would be ok.  If it is a pill with a decongestant in it, then that should be avoided.

## 2022-09-20 NOTE — Telephone Encounter (Signed)
Pt's daughter, [name], called stating herself & the pt have been dealing with a cough. [name] stated the recently got "Nasal Relief" from [facility] & started taking it for herself. [name] stated the pt is currently on lisinopril (ZESTRIL) 40 MG tablet & pantoprazole (PROTONIX) 40 MG tablet. [name] is asking if it'll be okay to give the Nasal Relief to the pt? [name] requested a [employer] with Copland's response due to her being at work & cannot accept calls. Call back # [phone]

## 2022-09-21 ENCOUNTER — Encounter: Payer: Self-pay | Admitting: *Deleted

## 2022-09-21 NOTE — Telephone Encounter (Addendum)
Left v/m requesting  cb from Webb Silversmith sending note to [place] triage and [facility] pool. If I am out of office please ck with  [facility] LPN or Butch Penny CMA.

## 2022-09-21 NOTE — Telephone Encounter (Signed)
I spoke with [name] (DPR signed) and said for several months (approx 9 months) pt has had a runny nose with clear mucus. No other symptoms; no cough, no fever, no S/T, and no H/A or dizziness. [name] has noticed at night when pt sitting and eating supper meal pt nose seems to run more. [name] purchased [facility] Allergy relief capsules with DM  but have not given to pt. Delton See of Dr Serita Grit remarks and instruction in this note and [name] voiced understanding and will continue to try the ipratropium nasal spray and fluticasone nasal spray to see if that will help at all. [name] said some days the nasal sprays seem to help and other days they do not help at all. [name] is not sure why one day will help runny nose and other days not help at all. [name] request any help or suggestions at all and request cb after reviewed by Dr Lorelei [facility] [address]. Sending note to Dr Lorelei Pont and [facility] pool.

## 2022-09-21 NOTE — Telephone Encounter (Signed)
Left v/m requesting cb to [facility]. 

## 2022-09-21 NOTE — Telephone Encounter (Signed)
[name] notified as instructed by telephone.  Information also sent to Ms. Shisler's MyChart so she could have the spelling of the decongestants.

## 2022-09-21 NOTE — Telephone Encounter (Signed)
Please call them as they request  Allergy relief with DM is probably ok, but she should not get anything with "D", pseudoephedrine, or phenylephrine.

## 2022-09-21 NOTE — Telephone Encounter (Signed)
Left v/m for [name] to call [facility] for more info. Sending note to [place] triage and [facility] pool.

## 2022-09-21 NOTE — Telephone Encounter (Signed)
Pt's daughter, [name], called back returning Rena's missed call. Spoke to rena, she said she'd return pt's call back. Call back # [phone]

## 2022-09-21 NOTE — Telephone Encounter (Signed)
Pt's daughter, [name], called back to let Dr. Lorelei Pont know that the "Nasal relief" wasn't a nasal spray, its a tablet. [name] requested a call back from Butch Penny to discuss more. Call back # [phone]

## 2022-10-02 ENCOUNTER — Other Ambulatory Visit: Payer: Self-pay | Admitting: Family Medicine

## 2022-10-09 ENCOUNTER — Other Ambulatory Visit: Payer: Self-pay | Admitting: Internal Medicine

## 2022-10-13 ENCOUNTER — Telehealth: Payer: Self-pay

## 2022-10-13 NOTE — Progress Notes (Signed)
Care Management & Coordination Services Pharmacy Team  Reason for Encounter: General adherence update   Contacted patient for general health update and medication adherence call.  Spoke with family on 10/21/2022  Spoke with daughter [name]   What concerns do you have about your medications? No concerns at this time -patient reports taking antivirals recently with no side effects.  The patient denies side effects with their medications.   How often do you forget or accidentally miss a dose? Never  Do you use a pillbox? Yes  Are you having any problems getting your medications from your pharmacy? No  Has the cost of your medications been a concern? No  Since last visit with PharmD, the following interventions have been made. Patient reports no runny nose, no current allergy symptoms  The patient has not had an ED visit since last contact.   The patient denies problems with their health.   Patient denies concerns or questions for [name] , PharmD at this time.   Counseled patient on: [place] job taking medications, Importance of taking medication daily without missed doses, Benefits of adherence packaging or a pillbox, and Access to carecoordination team for any cost, medication or pharmacy concerns.   Chart Updates:  Recent office visits:  10/17/22-Richard Letvak,MD(fam med)-fever,positive Covid-start molnupiravir 868m 2 times daily   Recent consult visits:  08/03/22-James Forsey,MD(ophth)-f/u macular degeneration -no medication changes,f/u 6 months 07/27/22-Christopher End,MD(cardio)-f/u Afib.labs (Labs are stable other than slight improvement in hemoglobin that is now in the normal range.  Normal kidney function, liver function, and electrolyte Cholesterol reasonable f/u 6 months  Hospital visits:  None in previous 6 months  Medications: Outpatient Encounter Medications as of 10/13/2022  Medication Sig   cetirizine (ZYRTEC) 10 MG tablet Take 10 mg by mouth daily.    Cholecalciferol (VITAMIN D) 50 MCG (2000 UT) CAPS Take 1 capsule by mouth daily.   docusate sodium (COLACE) 100 MG capsule Take 100 mg by mouth daily.   fluticasone (FLONASE) 50 MCG/ACT nasal spray Place 2 sprays into both nostrils daily.   furosemide (LASIX) 20 MG tablet Take 246m3 days per week PRN   ipratropium (ATROVENT) 0.06 % nasal spray Place 2 sprays into both nostrils 4 (four) times daily.   lisinopril (ZESTRIL) 40 MG tablet Take 1 tablet by mouth once daily   Multiple Vitamins-Minerals (ICAPS PO) Take 1 capsule by mouth daily.   pantoprazole (PROTONIX) 40 MG tablet Take 1 tablet by mouth once daily   XARELTO 15 MG TABS tablet TAKE 1 TABLET BY MOUTH EVERY DAY WITH SUPPER   No facility-administered encounter medications on file as of 10/13/2022.    Recent vitals BP Readings from Last 3 Encounters:  07/27/22 120/80  03/24/22 130/70  01/26/22 (!) 150/88   Pulse Readings from Last 3 Encounters:  03/24/22 75  01/26/22 86  09/29/21 71   Wt Readings from Last 3 Encounters:  07/27/22 111 lb (50.3 kg)  03/24/22 112 lb (50.8 kg)  01/26/22 114 lb (51.7 kg)   BMI Readings from Last 3 Encounters:  07/27/22 23.20 kg/m  03/24/22 23.41 kg/m  01/26/22 23.83 kg/m    Recent lab results    Component Value Date/Time   NA 142 07/27/2022 1229   NA 141 07/29/2021 1022   K 4.6 07/27/2022 1229   CL 106 07/27/2022 1229   CO2 27 07/27/2022 1229   GLUCOSE 110 (H) 07/27/2022 1229   BUN 15 07/27/2022 1229   BUN 18 07/29/2021 1022   CREATININE 0.86 07/27/2022  1229   CALCIUM 9.5 07/27/2022 1229    Lab Results  Component Value Date   CREATININE 0.86 07/27/2022   GFR 42.41 (L) 09/29/2021   EGFR 68 07/29/2021   GFRNONAA >60 07/27/2022   GFRAA >60 08/27/2019   Lab Results  Component Value Date/Time   HGBA1C 5.8 02/18/2019 09:25 AM   HGBA1C 5.5 03/19/2017 04:17 AM    Lab Results  Component Value Date   CHOL 182 07/27/2022   HDL 78 07/27/2022   LDLCALC 92 07/27/2022   LDLDIRECT  101.2 05/20/2009   TRIG 59 07/27/2022   CHOLHDL 2.3 07/27/2022    Care Gaps: Annual wellness visit in last year? Yes   Star Rating Drugs:  Medication:  Last Fill: Day Supply Lisinopril 70m 10/10/22 90   LCharlene Brooke PharmD notified  [facility] Sensor CLowellAssistant [phone]

## 2022-10-17 ENCOUNTER — Telehealth: Payer: Self-pay | Admitting: Internal Medicine

## 2022-10-17 ENCOUNTER — Ambulatory Visit (INDEPENDENT_AMBULATORY_CARE_PROVIDER_SITE_OTHER): Payer: PPO | Admitting: Internal Medicine

## 2022-10-17 ENCOUNTER — Encounter: Payer: Self-pay | Admitting: Internal Medicine

## 2022-10-17 VITALS — BP 124/80 | HR 84 | Temp 98.0°F | Ht <= 58 in | Wt 106.0 lb

## 2022-10-17 DIAGNOSIS — U071 COVID-19: Secondary | ICD-10-CM | POA: Insufficient documentation

## 2022-10-17 DIAGNOSIS — R509 Fever, unspecified: Secondary | ICD-10-CM

## 2022-10-17 LAB — POC COVID19 BINAXNOW: SARS Coronavirus 2 Ag: POSITIVE — AB

## 2022-10-17 MED ORDER — MOLNUPIRAVIR 200 MG PO CAPS: 4.0000 | ORAL_CAPSULE | Freq: Two times a day (BID) | ORAL | 0 refills | Status: DC

## 2022-10-17 NOTE — Assessment & Plan Note (Signed)
Mild symptoms but just started High risk due to age Can't take paxlovid with the xarelto---will try molnupiravir (told her to stop if any apparent GI problems) Analgesics, OTC cough meds prn ER if SOB

## 2022-10-17 NOTE — Progress Notes (Signed)
Subjective:    Patient ID: Paula Luna, female    DOB: Jan 27, 1925, 89 y.o.   MRN: [id number]  HPI Here with daughter due to respiratory illness  Started feeling sick this morning Left side sore throat--better now Daughter just with COVID last week--tried to quarantine as much as she could No headache Occasional cough due to dripping nose No SOB Did have fever this morning No myalgias  Did have cold feeling  Hasn't taken anything for this  Current Outpatient Medications on File Prior to Visit  Medication Sig Dispense Refill   cetirizine (ZYRTEC) 10 MG tablet Take 10 mg by mouth daily.     Cholecalciferol (VITAMIN D) 50 MCG (2000 UT) CAPS Take 1 capsule by mouth daily.     docusate sodium (COLACE) 100 MG capsule Take 100 mg by mouth daily.     fluticasone (FLONASE) 50 MCG/ACT nasal spray Place 2 sprays into both nostrils daily.     furosemide (LASIX) 20 MG tablet Take 20mg  3 days per week PRN     ipratropium (ATROVENT) 0.06 % nasal spray Place 2 sprays into both nostrils 4 (four) times daily. 15 mL 3   lisinopril (ZESTRIL) 40 MG tablet Take 1 tablet by mouth once daily 90 tablet 0   Multiple Vitamins-Minerals (ICAPS PO) Take 1 capsule by mouth daily.     pantoprazole (PROTONIX) 40 MG tablet Take 1 tablet by mouth once daily 90 tablet 3   XARELTO 15 MG TABS tablet TAKE 1 TABLET BY MOUTH EVERY DAY WITH SUPPER 90 tablet 1   No current facility-administered medications on file prior to visit.    Allergies  Allergen Reactions   Latex Rash   Tetanus Toxoid Anaphylaxis    REACTION: anaphylaxis   Tramadol Other (See Comments)    Caused severe constipation---bowel obstruction   Alendronate Sodium     REACTION: passed out    Past Medical History:  Diagnosis Date   CKD (chronic kidney disease) stage 3, GFR 30-59 ml/min ([place]) 10/04/2021   Hypertension 12/25/2013   Ischemic bowel disease ([place])    a. 03/2017 abd pain-->internal herina and ischemic jejunum s/p ex lap and small  bowel resection.   Macular degeneration of left eye 01/06/2015   Osteoporosis    Permanent atrial fibrillation ([place])    a. First noted 03/2017;  b. 07/2017 Echo: EF 60-65%, no rwma, mild MR, mildly to mod dil LA; c. 07/2017 48hr Holter: persistent Afib, avg HR of 66 (range 39-140 bpm);  d. CHA2DS2VASc = 4-->Xarelto initiated 07/2017.    Past Surgical History:  Procedure Laterality Date   CLOSED REDUCTION PROXIMAL HUMERUS FRACTURE Left 2017   LAPAROSCOPIC SMALL BOWEL RESECTION     LAPAROTOMY N/A 03/18/2017   Procedure: EXPLORATORY LAPAROTOMY, SMALL BOWEL RESECTION;  Surgeon: Jules Husbands, MD;  Location: [facility] ORS;  Service: General;  Laterality: N/A;   TOTAL ABDOMINAL HYSTERECTOMY      Family History  Problem Relation Age of Onset   Diabetes Mother    Cancer Father        jaw    Social History   Socioeconomic History   Marital status: Widowed    Spouse name: Not on file   Number of children: Not on file   Years of education: Not on file   Highest education level: Not on file  Occupational History   Occupation: Greeter    Employer: [employer]    Comment: 3 days a week  Tobacco Use   Smoking status: Former  Smokeless tobacco: Never  Vaping Use   Vaping Use: Never used  Substance and Sexual Activity   Alcohol use: No   Drug use: No   Sexual activity: Not Currently  Other Topics Concern   Not on file  Social History Narrative   Widowed after 69 years   Working some at [employer] in [employer] with daughter   Social Determinants of Health   Financial Resource Strain: Medium Risk (07/19/2021)   Overall Financial Resource Strain (CARDIA)    Difficulty of Paying Living Expenses: Somewhat hard  Food Insecurity: No Food Insecurity (07/28/2021)   Hunger Vital Sign    Worried About Running Out of Food in the Last Year: Never true    Ran Out of Food in the Last Year: Never true  Transportation Needs: No Transportation Needs (07/28/2021)   PRAPARE -  Hydrologist (Medical): No    Lack of Transportation (Non-Medical): No  Physical Activity: Not on file  Stress: Not on file  Social Connections: Not on file  Intimate Partner Violence: Not on file   Review of Systems No change in smell or taste Vomited once this AM after breakfast--then felt okay after Appetite off now Drinking okay    Objective:   Physical Exam Constitutional:      Appearance: Normal appearance.  HENT:     Head:     Comments: No sinus tenderness    Right Ear: Tympanic membrane and ear canal normal.     Left Ear: Tympanic membrane and ear canal normal.     Mouth/Throat:     Pharynx: No oropharyngeal exudate or posterior oropharyngeal erythema.  Pulmonary:     Effort: Pulmonary effort is normal.     Breath sounds: Normal breath sounds. No wheezing or rales.  Musculoskeletal:     Cervical back: Neck supple.  Lymphadenopathy:     Cervical: No cervical adenopathy.  Neurological:     Mental Status: She is alert.            Assessment & Plan:

## 2022-10-17 NOTE — Telephone Encounter (Signed)
Pt c/o medication issue:  1. Name of Medication: Molnupiravir  2. How are you currently taking this medication (dosage and times per day)?   3. Are you having a reaction (difficulty breathing--STAT)?   4. What is your medication issue? Pt's daughter is calling to say that a doctor today gave her this medication due to covid and they are concerned about med and would like to get advice as to if she needs to take this. Please advise.

## 2022-10-18 ENCOUNTER — Telehealth: Payer: Self-pay

## 2022-10-18 NOTE — Telephone Encounter (Signed)
Noted  

## 2022-10-18 NOTE — Telephone Encounter (Signed)
No interactions between molnupiravir and her current medications

## 2022-10-18 NOTE — Telephone Encounter (Signed)
Yes, she is high risk, and she should definitely take it.

## 2022-10-18 NOTE — Telephone Encounter (Signed)
[facility] Night - Client TELEPHONE ADVICE RECORD [facility] Patient Name: Paula Luna Gender: Female DOB: 1925-01-17 Age: 89 Y 21 D Return Phone Number: [phone] (Primary) [place]  [address] Client [facility] Night - Client Client Site [place] Provider Owens Loffler - MD Contact Type Call Who Is Calling Patient / Member / Family / Caregiver Call Type Triage / Clinical Caller Name Tomi Likens Relationship To Patient Daughter Return Phone Number [phone] (Primary) Chief Complaint Medication Question (non symptomatic) Reason for Call Medication Question / Request Initial Comment Caller states her mother was diagnosed with covid and she was prescribed some medication . She would like to speak with her doctor before taking any medication . Translation No No Triage Reason Other Nurse Assessment Nurse: Dellie Catholic, RN, Cindy Date/Time (Eastern Time): 10/17/2022 5:13:43 PM Confirm and document reason for call. If symptomatic, describe symptoms. ---Caller states her mother was diagnosed with Covid and she was prescribed some medication . She would like to speak with her doctor before taking any medication . Mother has sore throat and fever just left Dr's office Does the patient have any new or worsening symptoms? ---Yes Will a triage be completed? ---No Select reason for no triage. ---Other Please document clinical information provided and list any resource used. ---Just left Dr Gabriel Carina . Disp. Time Eilene Ghazi Time) Disposition Final User 10/17/2022 5:17:52 PM Clinical Call Yes Dellie Catholic, RN, Jenny Reichmann Final Disposition 10/17/2022 5:17:52 PM Clinical Call Yes Dellie Catholic, RN, Jenny Reichmann PLEASE NOTE: All timestamps contained within this report are represented as [place] Standard Time. CONFIDENTIALTY NOTICE: This fax transmission is intended only for the addressee. It  contains information that is legally privileged, confidential or otherwise protected from use or disclosure. If you are not the intended recipient, you are strictly prohibited from reviewing, disclosing, copying using or disseminating any of this information or taking any action in reliance on or regarding this information. If you have received this fax in error, please notify us immediately by telephone so that we can arrange for its return to [place]. Phone: [phone], Toll-Free: [phone], Fax: [phone] Page: 2 of 2 Call Id: [id number] Comments User: Gillian Shields, RN Date/Time Eilene Ghazi Time): 10/17/2022 5:17:29 PM Did not want to be triaged considering just left Dr. office . Would like to speak with Dr. Edilia Bo only . Please have Dr . Edilia Bo call her in the morning in regards to a new prescription

## 2022-10-18 NOTE — Telephone Encounter (Signed)
We have already addressed this and recommended antivirals to her.

## 2022-10-18 NOTE — Telephone Encounter (Signed)
If her PCP is recommending molnupiravir for treatment of COVID-19, I think it would be reasonable to proceed with this therapy.  I would appreciate our pharmacy team reviewing her current medications, particularly rivaroxaban, to make sure there aren't any interactions with monupiravir.  Thanks.  Nelva Bush, MD [facility]

## 2022-10-18 NOTE — Telephone Encounter (Signed)
[name] notified as instructed by telephone.  She states he mom refuses to take the antiviral.  I told [name] we can't make her take it but that I would make sure she know you spoke with our office and Dr. Lorelei Pont definitely thinks she should take it.  It she still refuses,  then just to symptomatic care with Tylenol for fever and Mucinex for cough.  Make sure she is getting plenty of rest and staying hydrated.  Will forward to Dr. Lorelei Pont for any other recommendations.

## 2022-10-18 NOTE — Telephone Encounter (Signed)
See Phone Encounter from yesterday sent to Dr. Saunders Revel.

## 2022-10-18 NOTE — Telephone Encounter (Signed)
I do not have any other additional recommendations.

## 2022-10-18 NOTE — Telephone Encounter (Signed)
Patient would still like the opinion of Dr Lorelei Pont as to if she should take the antiviral that was prescribed by Dr Silvio Pate for her covid?

## 2022-10-18 NOTE — Telephone Encounter (Signed)
Per chart review tab pt had office visit with Dr Silvio Pate on 10/17/22. Per access note only wants to speak with Dr Lorelei Pont who is out of office today. Sending note to Dr Silvio Pate who is in office today, Dr Lorelei Pont and [place] pool.

## 2022-10-19 ENCOUNTER — Ambulatory Visit: Payer: PPO | Admitting: Podiatry

## 2022-10-24 NOTE — Telephone Encounter (Signed)
Spoke to pt's daughter. Gave her the message from Dr Silvio Pate about the antihistamines.

## 2022-10-24 NOTE — Telephone Encounter (Signed)
Daughter called in today stating that her mom(patient) is done with her antivirals from her covid diagnosis,however she is still having a runny nose an raspy voice. She would like to know if theres anything over the counter that she can pick up to help with her runny nose and raspy voice?

## 2022-10-31 ENCOUNTER — Telehealth: Payer: Self-pay | Admitting: Family Medicine

## 2022-10-31 NOTE — Telephone Encounter (Signed)
Yes, this is ok.

## 2022-10-31 NOTE — Telephone Encounter (Signed)
Patients daughter called and said that she thinks patient is showing signs of dementia and she said she wanted see if it would be ok on the AWV 11/16/22 if patient can just be in the room by herself so she wont be looking to her daughter for the answer, She said she can be sitting in the lobby or a different room and they can let her know sometime after. IS this okay. Call back is [phone]. She said she gets off work after [employer]

## 2022-10-31 NOTE — Telephone Encounter (Signed)
[name] notified by telephone that Dr. Lorelei Pont is fine seeing Paula Luna alone at her upcoming office visit as requested.

## 2022-11-07 ENCOUNTER — Other Ambulatory Visit: Payer: Self-pay | Admitting: Internal Medicine

## 2022-11-07 DIAGNOSIS — I4821 Permanent atrial fibrillation: Secondary | ICD-10-CM

## 2022-11-07 NOTE — Telephone Encounter (Signed)
Prescription refill request for Xarelto received.  Indication: a fib Last office visit: 07/27/22 Weight: 48kg Age: 89 Scr: 0.86 07/27/22 CrCl: 29 mL/min

## 2022-11-16 ENCOUNTER — Ambulatory Visit (INDEPENDENT_AMBULATORY_CARE_PROVIDER_SITE_OTHER): Payer: PPO

## 2022-11-16 ENCOUNTER — Telehealth: Payer: Self-pay | Admitting: Family Medicine

## 2022-11-16 VITALS — Ht 59.0 in | Wt 106.0 lb

## 2022-11-16 DIAGNOSIS — Z5941 Food insecurity: Secondary | ICD-10-CM

## 2022-11-16 DIAGNOSIS — Z0000 Encounter for general adult medical examination without abnormal findings: Secondary | ICD-10-CM

## 2022-11-16 NOTE — Progress Notes (Signed)
Subjective:   Paula Luna is a 89 y.o. female who presents for Medicare Annual (Subsequent) preventive examination.  Review of Systems      Cardiac Risk Factors include: hypertension;advanced age (>73mn, >>71women);sedentary lifestyle     Objective:    Today's Vitals   11/16/22 1431  Weight: 106 lb (48.1 kg)  Height: '4\' 11"'$  (1.499 m)   Body mass index is 21.41 kg/m.     11/16/2022    2:45 PM 04/02/2021    1:22 PM 10/02/2020    6:11 AM 10/01/2020    9:14 AM 04/30/2019    4:20 PM 02/18/2019    9:01 PM 02/13/2018   12:50 PM  Advanced Directives  Does Patient Have a Medical Advance Directive? Yes No No No Yes Yes Yes  Type of [place]of [place] will    Living will;Healthcare Power of [place]  Does patient want to make changes to medical advance directive? No - Patient declined        Copy of HF[place]in Chart? Yes - validated most recent copy scanned in chart (See row information)     No - copy requested No - copy requested  Would patient like information on creating a medical advance directive?  No - Patient declined No - Patient declined No - Patient declined       Current Medications (verified) Outpatient Encounter Medications as of 11/16/2022  Medication Sig   Cholecalciferol (VITAMIN D) 50 MCG (2000 UT) CAPS Take 1 capsule by mouth daily.   docusate sodium (COLACE) 100 MG capsule Take 100 mg by mouth daily.   furosemide (LASIX) 20 MG tablet Take '20mg'$  3 days per week PRN   lisinopril (ZESTRIL) 40 MG tablet Take 1 tablet by mouth once daily   pantoprazole (PROTONIX) 40 MG tablet Take 1 tablet by mouth once daily   Rivaroxaban (XARELTO) 15 MG TABS tablet TAKE 1 TABLET BY MOUTH ONCE DAILY WITH SUPPER   cetirizine (ZYRTEC) 10 MG tablet Take 10 mg by mouth daily. (Patient not taking: Reported on 11/16/2022)   fluticasone (FLONASE) 50 MCG/ACT  nasal spray Place 2 sprays into both nostrils daily. (Patient not taking: Reported on 11/16/2022)   ipratropium (ATROVENT) 0.06 % nasal spray Place 2 sprays into both nostrils 4 (four) times daily. (Patient not taking: Reported on 11/16/2022)   molnupiravir EUA (LAGEVRIO) 200 MG CAPS capsule Take 4 capsules (800 mg total) by mouth in the morning and at bedtime. (Patient not taking: Reported on 11/16/2022)   Multiple Vitamins-Minerals (ICAPS PO) Take 1 capsule by mouth daily. (Patient not taking: Reported on 11/16/2022)   No facility-administered encounter medications on file as of 11/16/2022.    Allergies (verified) Latex, Tetanus toxoid, Tramadol, and Alendronate sodium   History: Past Medical History:  Diagnosis Date   CKD (chronic kidney disease) stage 3, GFR 30-59 ml/min ([place] 10/04/2021   Hypertension 12/25/2013   Ischemic bowel disease ([place]    a. 03/2017 abd pain-->internal herina and ischemic jejunum s/p ex lap and small bowel resection.   Macular degeneration of left eye 01/06/2015   Osteoporosis    Permanent atrial fibrillation ([place]    a. First noted 03/2017;  b. 07/2017 Echo: EF 60-65%, no rwma, mild MR, mildly to mod dil LA; c. 07/2017 48hr Holter: persistent Afib, avg HR of 66 (range 39-140 bpm);  d. CHA2DS2VASc = 4-->Xarelto initiated 07/2017.   Past Surgical History:  Procedure Laterality Date   CLOSED REDUCTION PROXIMAL HUMERUS FRACTURE Left 2017   LAPAROSCOPIC SMALL BOWEL RESECTION     LAPAROTOMY N/A 03/18/2017   Procedure: EXPLORATORY LAPAROTOMY, SMALL BOWEL RESECTION;  Surgeon: Jules Husbands, MD;  Location: [facility] ORS;  Service: General;  Laterality: N/A;   TOTAL ABDOMINAL HYSTERECTOMY     Family History  Problem Relation Age of Onset   Diabetes Mother    Cancer Father        jaw   Social History   Socioeconomic History   Marital status: Widowed    Spouse name: Not on file   Number of children: Not on file   Years of education: Not on file   Highest education level: Not  on file  Occupational History   Occupation: Radiographer, 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    Comment: 3 days a week  Tobacco Use   Smoking status: Former   Smokeless tobacco: Never  Vaping Use   Vaping Use: Never used  Substance and Sexual Activity   Alcohol use: No   Drug use: No   Sexual activity: Not Currently  Other Topics Concern   Not on file  Social History Narrative   Widowed after 47 years   Working some at [employer] in [employer] with daughter   Social Determinants of Health   Financial Resource Strain: [place]  (11/16/2022)   Overall Financial Resource Strain (CARDIA)    Difficulty of Paying Living Expenses: Not hard at all  Food Insecurity: No [place] (11/16/2022)   Hunger Vital Sign    Worried About Running Out of Food in the Last Year: Never true    [place] in the Last Year: Never true  Transportation Needs: No Transportation Needs (11/16/2022)   PRAPARE - Hydrologist (Medical): No    Lack of Transportation (Non-Medical): No  Physical Activity: Inactive (11/16/2022)   Exercise Vital Sign    Days of Exercise per Week: 0 days    Minutes of Exercise per Session: 0 min  Stress: No Stress Concern Present (11/16/2022)   [place]    Feeling of Stress : Not at all  Social Connections: Moderately Isolated (11/16/2022)   Social Connection and Isolation Panel [NHANES]    Frequency of Communication with Friends and Family: More than three times a week    Frequency of Social Gatherings with Friends and Family: Twice a week    Attends Religious Services: 1 to 4 times per year    Active Member of [employer] or Organizations: No    Attends Archivist Meetings: Never    Marital Status: Widowed    Tobacco Counseling Counseling given: Not Answered   Clinical Intake:  Pre-visit preparation completed: Yes  Pain : No/denies pain     Nutritional Risks:  None Diabetes: No  How often do you need to have someone help you when you read instructions, pamphlets, or other written materials from your doctor or pharmacy?: 1 - Never  Diabetic? no  Interpreter Needed?: No  Information entered by :: C.Deano Tomaszewski LPN   Activities of Daily Living    11/16/2022    2:46 PM  In your present state of health, do you have any difficulty performing the following activities:  Hearing? 1  Comment some  Vision? 1  Comment Macular degeration per pt  Difficulty concentrating or making decisions? 0  Walking or  climbing stairs? 1  Comment daughter assists  Dressing or bathing? 1  Comment daughter assists  Doing errands, shopping? 1  Preparing Food and eating ? Y  Comment daughter assists  Using the Toilet? N  In the past six months, have you accidently leaked urine? Y  Comment wears depends  Do you have problems with loss of bowel control? Y  Managing your Medications? Y  Comment daughter assists  Managing your Finances? Y  Housekeeping or managing your Housekeeping? Y    Patient Care Team: Owens Loffler, MD as PCP - General End, Harrell Gave, MD as PCP - Cardiology (Cardiology) Charlton Haws, [facility] as Pharmacist (Pharmacist)  Indicate any recent Medical Services you may have received from other than [facility] providers in the past year (date may be approximate).     Assessment:   This is a routine wellness examination for Ray[place].  Hearing/Vision screen Hearing Screening - Comments:: No aids Vision Screening - Comments:: Glasses - [facility] [place]  Dietary issues and exercise activities discussed: Current Exercise Habits: The patient does not participate in regular exercise at present   Goals Addressed   None    Depression Screen    11/16/2022    2:44 PM 09/29/2021   12:13 PM 03/25/2020   11:34 AM 02/18/2019    8:57 PM 02/13/2018   12:55 PM 01/23/2017    9:03 AM 01/20/2016    3:53 PM  PHQ 2/9 Scores  PHQ - 2 Score 0 0 0 0 0 0  0  PHQ- 9 Score    0 0      Fall Risk    11/16/2022    2:46 PM 09/29/2021   12:12 PM 03/25/2020   11:34 AM 02/18/2019    8:57 PM 02/13/2018   12:55 PM  [place] in the past year? 0 1 0 0 No  Number falls in past yr: 0 1     Injury with Fall? 0 1     Risk for fall due to : No Fall Risks      Follow up Falls prevention discussed;Falls evaluation completed        FALL RISK PREVENTION PERTAINING TO THE HOME:  Any stairs in or around the home? Yes  If so, are there any without handrails? No  Home free of loose throw rugs in walkways, pet beds, electrical cords, etc? Yes  Adequate lighting in your home to reduce risk of falls? Yes   ASSISTIVE DEVICES UTILIZED TO PREVENT FALLS:  Life alert? No  Use of a cane, walker or w/c? Yes  Grab bars in the bathroom? No  Shower chair or bench in shower? Yes  Elevated toilet seat or a handicapped toilet? Yes    Cognitive Function:    02/18/2019    9:00 PM 02/13/2018   12:55 PM 01/23/2017    9:03 AM  MMSE - Mini Mental State Exam  Orientation to time '5 5 5  '$ Orientation to Place '5 5 5  '$ Registration '3 3 3  '$ Attention/ Calculation 0 0 0  Recall '3 3 3  '$ Language- name 2 objects 0 0 0  Language- repeat '1 1 1  '$ Language- follow 3 step command 0 3 3  Language- read & follow direction 0 0 0  Write a sentence 0 0 0  Copy design 0 0 0  Total score '17 20 20        '$ 11/16/2022    2:49 PM  6CIT Screen  What Year? 0  points  What month? 0 points  What time? 0 points  Count back from 20 0 points  Months in reverse 0 points  Repeat phrase 6 points  Total Score 6 points    Immunizations Immunization History  Administered Date(s) Administered   Fluad Quad(high Dose 65+) 06/15/2020, 06/18/2021, 06/15/2022   Influenza Split 07/12/2012, 06/28/2013   Influenza Whole 08/20/2007, 05/10/2010   Influenza, High Dose Seasonal PF 08/14/2014, 07/29/2015, 05/22/2016, 06/10/2017, 06/20/2018   Influenza,inj,Quad PF,6+ Mos 05/07/2019   PFIZER(Purple  Top)SARS-COV-2 Vaccination 11/29/2019, 12/20/2019   Pneumococcal Conjugate-13 12/25/2013   Pneumococcal Polysaccharide-23 05/10/2010   Zoster Recombinat (Shingrix) 03/06/2018   Zoster, Live 10/15/2012    TDAP status: Due, Education has been provided regarding the importance of this vaccine. Advised may receive this vaccine at local pharmacy or Health Dept. Aware to provide a copy of the vaccination record if obtained from local pharmacy or Health Dept. Verbalized acceptance and understanding.  Flu Vaccine status: Up to date  Pneumococcal vaccine status: Up to date  Covid-19 vaccine status: Information provided on how to obtain vaccines.   Qualifies for Shingles Vaccine? No   Zostavax completed Yes   Shingrix Completed?: No.    Education has been provided regarding the importance of this vaccine. Patient has been advised to call insurance company to determine out of pocket expense if they have not yet received this vaccine. Advised may also receive vaccine at local pharmacy or Health Dept. Verbalized acceptance and understanding.  Screening Tests Health Maintenance  Topic Date Due   DTaP/Tdap/Td (1 - Tdap) Never done   Zoster Vaccines- Shingrix (2 of 2) 05/01/2018   COVID-19 Vaccine (3 - Pfizer risk series) 01/17/2020   Medicare Annual Wellness (AWV)  11/16/2023   Pneumonia Vaccine 11+ Years old  Completed   INFLUENZA VACCINE  Completed   DEXA SCAN  Completed   HPV VACCINES  Aged Out    Health Maintenance  Health Maintenance Due  Topic Date Due   DTaP/Tdap/Td (1 - Tdap) Never done   Zoster Vaccines- Shingrix (2 of 2) 05/01/2018   COVID-19 Vaccine (3 - Pfizer risk series) 01/17/2020    Colorectal cancer screening: No longer required.   Mammogram status: No longer required due to age.  Bone Scan- Declined  Lung Cancer Screening: (Low Dose CT Chest recommended if Age 12-80 years, 30 pack-year currently smoking OR have quit w/in 15years.) does not qualify.   Lung Cancer  Screening Referral: no  Additional Screening:  Hepatitis C Screening: does not qualify; Completed no  Vision Screening: Recommended annual ophthalmology exams for early detection of glaucoma and other disorders of the eye. Is the patient up to date with their annual eye exam?  Yes  Who is the provider or what is the name of the office in which the patient attends annual eye exams? [facility] If pt is not established with a provider, would they like to be referred to a provider to establish care? No .   Dental Screening: Recommended annual dental exams for proper oral hygiene  Community Resource Referral / Chronic Care Management: CRR required this visit?  Yes   CCM required this visit?  No      Plan:     I have personally reviewed and noted the following in the patient's chart:   Medical and social history Use of alcohol, tobacco or illicit drugs  Current medications and supplements including opioid prescriptions. Patient is not currently taking opioid prescriptions. Functional ability and status Nutritional status  Physical activity Advanced directives List of other physicians Hospitalizations, surgeries, and ER visits in previous 12 months Vitals Screenings to include cognitive, depression, and falls Referrals and appointments  In addition, I have reviewed and discussed with patient certain preventive protocols, quality metrics, and best practice recommendations. A written personalized care plan for preventive services as well as general preventive health recommendations were provided to patient.     Lebron Conners, LPN   [id number]   Nurse Notes: Community referral placed for food insecurity. Daughter states they have a difficult time affording groceries at the end of the month.

## 2022-11-16 NOTE — Patient Instructions (Signed)
Paula Luna , Thank you for taking time to come for your Medicare Wellness Visit. I appreciate your ongoing commitment to your health goals. Please review the following plan we discussed and let me know if I can assist you in the future.   These are the goals we discussed:  Goals      Patient Stated     Starting 02/18/2019, I will continue to participate in rehab for 45 minutes once weekly and to walk for 3 days per week.         This is a list of the screening recommended for you and due dates:  Health Maintenance  Topic Date Due   DTaP/Tdap/Td vaccine (1 - Tdap) Never done   Zoster (Shingles) Vaccine (2 of 2) 05/01/2018   COVID-19 Vaccine (3 - Pfizer risk series) 01/17/2020   Medicare Annual Wellness Visit  11/16/2023   Pneumonia Vaccine  Completed   Flu Shot  Completed   DEXA scan (bone density measurement)  Completed   HPV Vaccine  Aged Out    Advanced directives: copy on chart.  Conditions/risks identified: Aim for 30 minutes of exercise or brisk walking, 6-8 glasses of water, and 5 servings of fruits and vegetables each day.   Next appointment: Follow up in one year for your annual wellness visit 11/22/2023 @ 1:30 pm via telephone.   Preventive Care 4 Years and Older, Female Preventive care refers to lifestyle choices and visits with your health care provider that can promote health and wellness. What does preventive care include? A yearly physical exam. This is also called an annual well check. Dental exams once or twice a year. Routine eye exams. Ask your health care provider how often you should have your eyes checked. Personal lifestyle choices, including: Daily care of your teeth and gums. Regular physical activity. Eating a healthy diet. Avoiding tobacco and drug use. Limiting alcohol use. Practicing safe sex. Taking low-dose aspirin every day. Taking vitamin and mineral supplements as recommended by your health care provider. What happens during an annual  well check? The services and screenings done by your health care provider during your annual well check will depend on your age, overall health, lifestyle risk factors, and family history of disease. Counseling  Your health care provider may ask you questions about your: Alcohol use. Tobacco use. Drug use. Emotional well-being. Home and relationship well-being. Sexual activity. Eating habits. History of falls. Memory and ability to understand (cognition). Work and work Statistician. Reproductive health. Screening  You may have the following tests or measurements: Height, weight, and BMI. Blood pressure. Lipid and cholesterol levels. These may be checked every 5 years, or more frequently if you are over 96 years old. Skin check. Lung cancer screening. You may have this screening every year starting at age 27 if you have a 30-pack-year history of smoking and currently smoke or have quit within the past 15 years. Fecal occult blood test (FOBT) of the stool. You may have this test every year starting at age 67. Flexible sigmoidoscopy or colonoscopy. You may have a sigmoidoscopy every 5 years or a colonoscopy every 10 years starting at age 21. Hepatitis C blood test. Hepatitis B blood test. Sexually transmitted disease (STD) testing. Diabetes screening. This is done by checking your blood sugar (glucose) after you have not eaten for a while (fasting). You may have this done every 1-3 years. Bone density scan. This is done to screen for osteoporosis. You may have this done starting at age 42. Mammogram.  This may be done every 1-2 years. Talk to your health care provider about how often you should have regular mammograms. Talk with your health care provider about your test results, treatment options, and if necessary, the need for more tests. Vaccines  Your health care provider may recommend certain vaccines, such as: Influenza vaccine. This is recommended every year. Tetanus, diphtheria,  and acellular pertussis (Tdap, Td) vaccine. You may need a Td booster every 10 years. Zoster vaccine. You may need this after age 28. Pneumococcal 13-valent conjugate (PCV13) vaccine. One dose is recommended after age 13. Pneumococcal polysaccharide (PPSV23) vaccine. One dose is recommended after age 52. Talk to your health care provider about which screenings and vaccines you need and how often you need them. This information is not intended to replace advice given to you by your health care provider. Make sure you discuss any questions you have with your health care provider. Document Released: 09/25/2015 Document Revised: 05/18/2016 Document Reviewed: 06/30/2015 Elsevier Interactive Patient Education  2017 [employer] Prevention in the Home Falls can cause injuries. They can happen to people of all ages. There are many things you can do to make your home safe and to help prevent falls. What can I do on the outside of my home? Regularly fix the edges of walkways and driveways and fix any cracks. Remove anything that might make you trip as you walk through a door, such as a raised step or threshold. Trim any bushes or trees on the path to your home. Use bright outdoor lighting. Clear any walking paths of anything that might make someone trip, such as rocks or tools. Regularly check to see if handrails are loose or broken. Make sure that both sides of any steps have handrails. Any raised decks and porches should have guardrails on the edges. Have any leaves, snow, or ice cleared regularly. Use sand or salt on walking paths during winter. Clean up any spills in your garage right away. This includes oil or grease spills. What can I do in the bathroom? Use night lights. Install grab bars by the toilet and in the tub and shower. Do not use towel bars as grab bars. Use non-skid mats or decals in the tub or shower. If you need to sit down in the shower, use a plastic, non-slip  stool. Keep the floor dry. Clean up any water that spills on the floor as soon as it happens. Remove soap buildup in the tub or shower regularly. Attach bath mats securely with double-sided non-slip rug tape. Do not have throw rugs and other things on the floor that can make you trip. What can I do in the bedroom? Use night lights. Make sure that you have a light by your bed that is easy to reach. Do not use any sheets or blankets that are too big for your bed. They should not hang down onto the floor. Have a firm chair that has side arms. You can use this for support while you get dressed. Do not have throw rugs and other things on the floor that can make you trip. What can I do in the kitchen? Clean up any spills right away. Avoid walking on wet floors. Keep items that you use a lot in easy-to-reach places. If you need to reach something above you, use a strong step stool that has a grab bar. Keep electrical cords out of the way. Do not use floor polish or wax that makes floors slippery. If you  must use wax, use non-skid floor wax. Do not have throw rugs and other things on the floor that can make you trip. What can I do with my stairs? Do not leave any items on the stairs. Make sure that there are handrails on both sides of the stairs and use them. Fix handrails that are broken or loose. Make sure that handrails are as long as the stairways. Check any carpeting to make sure that it is firmly attached to the stairs. Fix any carpet that is loose or worn. Avoid having throw rugs at the top or bottom of the stairs. If you do have throw rugs, attach them to the floor with carpet tape. Make sure that you have a light switch at the top of the stairs and the bottom of the stairs. If you do not have them, ask someone to add them for you. What else can I do to help prevent falls? Wear shoes that: Do not have high heels. Have rubber bottoms. Are comfortable and fit you well. Are closed at the  toe. Do not wear sandals. If you use a stepladder: Make sure that it is fully opened. Do not climb a closed stepladder. Make sure that both sides of the stepladder are locked into place. Ask someone to hold it for you, if possible. Clearly mark and make sure that you can see: Any grab bars or handrails. First and last steps. Where the edge of each step is. Use tools that help you move around (mobility aids) if they are needed. These include: Canes. Walkers. Scooters. Crutches. Turn on the lights when you go into a dark area. Replace any light bulbs as soon as they burn out. Set up your furniture so you have a clear path. Avoid moving your furniture around. If any of your floors are uneven, fix them. If there are any pets around you, be aware of where they are. Review your medicines with your doctor. Some medicines can make you feel dizzy. This can increase your chance of falling. Ask your doctor what other things that you can do to help prevent falls. This information is not intended to replace advice given to you by your health care provider. Make sure you discuss any questions you have with your health care provider. Document Released: 06/25/2009 Document Revised: 02/04/2016 Document Reviewed: 10/03/2014 Elsevier Interactive Patient Education  2017 [employer].

## 2022-11-16 NOTE — Telephone Encounter (Signed)
Contacted Paula Luna to schedule their annual wellness visit. Appointment made for 11/16/2022. Appointment changed from in-person to a phone visit.  [place] Direct Dial: [phone]

## 2022-11-17 ENCOUNTER — Telehealth: Payer: Self-pay

## 2022-11-17 NOTE — Telephone Encounter (Signed)
   Telephone encounter was:  Unsuccessful.  11/17/2022 Name: PRESSLEY VALENZANO MRN: [id number] DOB: [birth year]  Unsuccessful outbound call made today to assist with:  Food Insecurity  Outreach Attempt:  1st Attempt  A HIPAA compliant voice message was left requesting a return call.  Instructed patient to call back.   [facility] [phone] [address], [place], [place] [address] Phone: [phone] Email: [email or web address]

## 2022-11-18 ENCOUNTER — Telehealth: Payer: Self-pay

## 2022-11-18 NOTE — Telephone Encounter (Signed)
   Telephone encounter was:  Unsuccessful.  11/18/2022 Name: Paula Luna MRN: [id number] DOB: [birth year]  Unsuccessful outbound call made today to assist with:  Food Insecurity  Outreach Attempt:  2nd Attempt  A HIPAA compliant voice message was left requesting a return call.  Instructed patient to call back     [facility] [phone] [address], [place], [place] [address] Phone: [phone] Email: [email or web address]

## 2022-11-21 ENCOUNTER — Telehealth: Payer: Self-pay

## 2022-11-21 NOTE — Telephone Encounter (Signed)
   Telephone encounter was:  Successful.  11/21/2022 Name: Paula Luna MRN: [id number] DOB: [birth year]  Paula Luna is a 89 y.o. year old female who is a primary care patient of Copland, Frederico Hamman, MD . The community resource team was consulted for assistance with Food Insecurity and Financial Difficulties related to Financial Strain Care guide performed the following interventions: Patient provided with information about care guide support team and interviewed to confirm resource needs.Patients daughter is taking care of her 17 yo mother and she having financial strain paying the bills and cant afford to buy food for the both of them. Daughter stated She dont know how much longer she can work pt due to her age just to make ends meet. I have mailed resources and added referrals to [facility]    Follow Up Plan:  No further follow up planned at this time. The patient has been provided with needed resources.    [facility] [phone] [address], [place], [place] [address] Phone: [phone] Email: [email or web address]

## 2022-11-21 NOTE — Telephone Encounter (Signed)
   Telephone encounter was:  Unsuccessful.  11/21/2022 Name: Paula Luna MRN: [id number] DOB: 28-Aug-1925  Unsuccessful outbound call made today to assist with:  Food Insecurity    A HIPAA compliant voice message was left requesting a return call.  Instructed patient to call back.    [facility] [phone] [address], [place], [place] [address] Phone: [phone] Email: [email or web address]

## 2022-11-22 ENCOUNTER — Telehealth: Payer: Self-pay

## 2022-11-22 NOTE — Telephone Encounter (Signed)
   Telephone encounter was:  Successful.  11/22/2022 Name: Paula Luna MRN: [id number] DOB: 1924/10/30  [name] is a 89 y.o. year old female who is a primary care patient of Copland, Frederico Hamman, MD . The community resource team was consulted for assistance with [place] guide performed the following interventions: Discussed resources to assist with meals .  Follow Up Plan:  Care guide will outreach resources to assist patient with other information requested   [place], [place] [address], [place], [place] [address] Phone: [phone] Email: [email or web address]

## 2022-11-23 ENCOUNTER — Telehealth: Payer: Self-pay

## 2022-11-23 NOTE — Patient Outreach (Signed)
  Care Coordination   In Person Provider Office Visit Note   11/23/2022 Name: Paula Luna MRN: [id number] DOB: 09/27/1924  Paula Luna is a 89 y.o. year old female who sees Copland, Frederico Hamman, MD for primary care.   What matters to the patients health and wellness today?  Food insecurity    Goals Addressed             This Visit's Progress    Find resouces for food       Care Coordination Interventions: Collaborated with Paula Luna, [place] regarding food/nourishment needs. Prepared a bag of Ensure nutritional drinks for patient along with coupons for future purchases. Left at front desk [facility] for patient daughter to pick up.          SDOH assessments and interventions completed:  Yes     Care Coordination Interventions:  Yes, provided   Follow up plan: No further intervention required.   Encounter Outcome:  Pt. Visit Completed

## 2022-11-24 ENCOUNTER — Telehealth: Payer: Self-pay

## 2022-11-24 NOTE — Telephone Encounter (Signed)
   Telephone encounter was:  Successful.  11/24/2022 Name: Paula Luna MRN: [id number] DOB: 29-Jun-1925  [name] is a 89 y.o. year old female who is a primary care patient of Copland, Frederico Hamman, MD . The community resource team was consulted for assistance with [place] guide performed the following interventions: Patient provided with information about care guide support team and interviewed to confirm resource needs. Confirmation for Moms Meals I will mail additional information for in home care   Follow Up Plan:  Care guide will follow up with patient by phone over the next day    [place], [place] [address], [place], [place] [address] Phone: [phone] Email: [email or web address]

## 2022-12-01 ENCOUNTER — Other Ambulatory Visit: Payer: Self-pay | Admitting: Family Medicine

## 2022-12-01 DIAGNOSIS — R739 Hyperglycemia, unspecified: Secondary | ICD-10-CM

## 2022-12-01 DIAGNOSIS — E782 Mixed hyperlipidemia: Secondary | ICD-10-CM

## 2022-12-01 DIAGNOSIS — Z79899 Other long term (current) drug therapy: Secondary | ICD-10-CM

## 2022-12-14 ENCOUNTER — Other Ambulatory Visit (INDEPENDENT_AMBULATORY_CARE_PROVIDER_SITE_OTHER): Payer: PPO

## 2022-12-14 DIAGNOSIS — Z79899 Other long term (current) drug therapy: Secondary | ICD-10-CM

## 2022-12-14 DIAGNOSIS — E782 Mixed hyperlipidemia: Secondary | ICD-10-CM | POA: Diagnosis not present

## 2022-12-14 DIAGNOSIS — R739 Hyperglycemia, unspecified: Secondary | ICD-10-CM | POA: Diagnosis not present

## 2022-12-14 LAB — BASIC METABOLIC PANEL
BUN: 21 mg/dL (ref 6–23)
CO2: 28 mEq/L (ref 19–32)
Calcium: 9.5 mg/dL (ref 8.4–10.5)
Chloride: 104 mEq/L (ref 96–112)
Creatinine, Ser: 0.88 mg/dL (ref 0.40–1.20)
GFR: 55.56 mL/min — ABNORMAL LOW (ref >60.00)
Glucose, Bld: 93 mg/dL (ref 70–99)
Potassium: 4.6 mEq/L (ref 3.5–5.1)
Sodium: 139 mEq/L (ref 135–145)

## 2022-12-14 LAB — CBC WITH DIFFERENTIAL/PLATELET
Basophils Absolute: 0 10*3/uL (ref 0.0–0.1)
Basophils Relative: 0.6 % (ref 0.0–3.0)
Eosinophils Absolute: 0.1 10*3/uL (ref 0.0–0.7)
Eosinophils Relative: 2.1 % (ref 0.0–5.0)
HCT: 39.7 % (ref 36.0–46.0)
Hemoglobin: 12.6 g/dL (ref 12.0–15.0)
Lymphocytes Relative: 31.8 % (ref 12.0–46.0)
Lymphs Abs: 1.7 10*3/uL (ref 0.7–4.0)
MCHC: 31.8 g/dL (ref 30.0–36.0)
MCV: 77.8 fl — ABNORMAL LOW (ref 78.0–100.0)
Monocytes Absolute: 0.5 10*3/uL (ref 0.1–1.0)
Monocytes Relative: 9.7 % (ref 3.0–12.0)
Neutro Abs: 3 10*3/uL (ref 1.4–7.7)
Neutrophils Relative %: 55.8 % (ref 43.0–77.0)
Platelets: 210 10*3/uL (ref 150.0–400.0)
RBC: 5.11 Mil/uL (ref 3.87–5.11)
RDW: 17.2 % — ABNORMAL HIGH (ref 11.5–15.5)
WBC: 5.4 10*3/uL (ref 4.0–10.5)

## 2022-12-14 LAB — TSH: TSH: 2.21 u[IU]/mL (ref 0.35–5.50)

## 2022-12-14 LAB — HEPATIC FUNCTION PANEL
ALT: 11 U/L (ref 0–35)
AST: 21 U/L (ref 0–37)
Albumin: 4.2 g/dL (ref 3.5–5.2)
Alkaline Phosphatase: 88 U/L (ref 39–117)
Bilirubin, Direct: 0.1 mg/dL (ref 0.0–0.3)
Total Bilirubin: 0.6 mg/dL (ref 0.2–1.2)
Total Protein: 6.7 g/dL (ref 6.0–8.3)

## 2022-12-14 LAB — LIPID PANEL
Cholesterol: 186 mg/dL (ref 0–200)
HDL: 85.8 mg/dL (ref >39.00)
LDL Cholesterol: 87 mg/dL (ref 0–99)
NonHDL: 100.54
Total CHOL/HDL Ratio: 2
Triglycerides: 66 mg/dL (ref 0.0–149.0)
VLDL: 13.2 mg/dL (ref 0.0–40.0)

## 2022-12-14 LAB — HEMOGLOBIN A1C: Hgb A1c MFr Bld: 5.7 % (ref 4.6–6.5)

## 2022-12-20 ENCOUNTER — Telehealth: Payer: Self-pay | Admitting: Family Medicine

## 2022-12-20 NOTE — Telephone Encounter (Signed)
I will review tomorrow

## 2022-12-20 NOTE — Telephone Encounter (Signed)
Letter was drop off for PCP to review with update on how pt is doing . Place in PCP folder

## 2022-12-20 NOTE — Telephone Encounter (Signed)
Paula Luna has her CPE scheduled with Dr. Patsy Lager on Wednesday 12/21/2022.  Will place letter in his office in box to review prior to her appointment.

## 2022-12-20 NOTE — Progress Notes (Addendum)
Paula Luna T. Montie Gelardi, MD, CAQ Sports Medicine [facility] [address] [place], [address]  Phone: [phone]  FAX: [phone]  Paula Luna - 89 y.o. female  MRN [id number]  Date of Birth: 1924/12/17  Date: 12/21/2022  PCP: Hannah Beat, MD  Referral: Hannah Beat, MD  Chief Complaint  Patient presents with   Annual Exam    Part 2   Patient Care Team: Hannah Beat, MD as PCP - General End, Cristal Deer, MD as PCP - Cardiology (Cardiology) Kathyrn Sheriff, [facility] as Pharmacist (Pharmacist) Subjective:   Paula Luna is a 89 y.o. pleasant patient who presents with the following:  Health Maintenance Summary Reviewed and updated, unless pt declines services.  Tobacco History Reviewed. Non-smoker Alcohol: No concerns, no excessive use Exercise Habits: minimal STD concerns: none Drug Use: None Lumps or breast concerns: no  Shingrix Covid booster  She is a well-known patient, aged 72 with some macular degeneration and permanent A. Fib.  Vision is not so good any more, macular degeneration.   Eats 3 times a day, cereal in the morning.  She is having some occaisional dysphagia  She is globally only doing so well.  Her daughter actually gives me a roughly 10 page paper of letter with various aches and pains, fatigue, concerns about urine, bowel movements, and multiple other things of this nature.  Nothing is glaringly obvious that is significantly wrong.  Wt Readings from Last 3 Encounters:  01/25/23 109 lb 6.4 oz (49.6 kg)  12/21/22 110 lb 8 oz (50.1 kg)  11/16/22 106 lb (48.1 kg)      Health Maintenance  Topic Date Due   DTaP/Tdap/Td (1 - Tdap) Never done   Zoster Vaccines- Shingrix (2 of 2) 05/01/2018   COVID-19 Vaccine (3 - Pfizer risk series) 01/17/2020   INFLUENZA VACCINE  04/13/2023   Medicare Annual Wellness (AWV)  11/16/2023   Pneumonia Vaccine 16+ Years old  Completed   DEXA SCAN   Completed   HPV VACCINES  Aged Out    Immunization History  Administered Date(s) Administered   Fluad Quad(high Dose 65+) 06/15/2020, 06/18/2021, 06/15/2022   Influenza Split 07/12/2012, 06/28/2013   Influenza Whole 08/20/2007, 05/10/2010   Influenza, High Dose Seasonal PF 08/14/2014, 07/29/2015, 05/22/2016, 06/10/2017, 06/20/2018   Influenza,inj,Quad PF,6+ Mos 05/07/2019   PFIZER(Purple Top)SARS-COV-2 Vaccination 11/29/2019, 12/20/2019   Pneumococcal Conjugate-13 12/25/2013   Pneumococcal Polysaccharide-23 05/10/2010   Zoster Recombinat (Shingrix) 03/06/2018   Zoster, Live 10/15/2012   Patient Active Problem List   Diagnosis Date Noted   Permanent atrial fibrillation (HCC) 04/26/2017    Priority: High   CKD (chronic kidney disease) stage 3, GFR 30-59 ml/min (HCC) 10/04/2021    Priority: Medium    Essential hypertension 12/25/2013    Priority: Medium    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 03/2017 abd pain-->internal herina and ischemic jejunum s/p ex lap and small bowel resection.   Macular degeneration of left eye 01/06/2015   Osteoporosis    Permanent atrial fibrillation (HCC)    a. First noted 03/2017;  b. 07/2017 Echo: EF  60-65%, no rwma, mild MR, mildly to mod dil LA; c. 07/2017 48hr Holter: persistent Afib, avg HR of 66 (range 39-140 bpm);  d. CHA2DS2VASc = 4-->Xarelto initiated 07/2017.    Past Surgical History:  Procedure Laterality Date   CLOSED REDUCTION PROXIMAL HUMERUS FRACTURE Left 2017   LAPAROSCOPIC SMALL BOWEL RESECTION     LAPAROTOMY N/A 03/18/2017   Procedure: EXPLORATORY  LAPAROTOMY, SMALL BOWEL RESECTION;  Surgeon: Leafy Ro, MD;  Location: [facility] ORS;  Service: General;  Laterality: N/A;   TOTAL ABDOMINAL HYSTERECTOMY      Family History  Problem Relation Age of Onset   Diabetes Mother    Cancer Father        jaw    Social History   Social History Narrative   Widowed after 64 years   Working some at [employer] in [employer] with daughter    Past Medical History, Surgical History, Social History, Family History, Problem List, Medications, and Allergies have been reviewed and updated if relevant.  Review of Systems: Pertinent positives are listed above.  Otherwise, a full 14 point review of systems has been done in full and it is negative except where it is noted positive.  Objective:   BP 130/72   Pulse 75   Temp 98.3 F (36.8 C) (Temporal)   Ht 4\' 8"  (1.422 m)   Wt 110 lb 8 oz (50.1 kg)   SpO2 97%   BMI 24.77 kg/m  Ideal Body Weight: Weight in (lb) to have BMI = 25: 111.3 No results found.    11/16/2022    2:44 PM 09/29/2021   12:13 PM 03/25/2020   11:34 AM 02/18/2019    8:57 PM 02/13/2018   12:55 PM  Depression screen PHQ 2/9  Decreased Interest 0 0 0 0 0  Down, Depressed, Hopeless 0 0 0 0 0  PHQ - 2 Score 0 0 0 0 0  Altered sleeping    0 0  Tired, decreased energy    0 0  Change in appetite    0 0  Feeling bad or failure about yourself     0 0  Trouble concentrating    0 0  Moving slowly or fidgety/restless    0 0  Suicidal thoughts    0 0  PHQ-9 Score    0 0  Difficult doing work/chores    Not difficult at all Not difficult at all     GEN: well developed, well nourished, no acute distress Eyes: conjunctiva and lids normal, PERRLA, EOMI ENT: TM clear, nares clear, oral exam WNL Neck: supple, no lymphadenopathy, no thyromegaly, no JVD Pulm: clear to auscultation and percussion, respiratory effort normal CV: Irregularly irregular Chest: no scars, masses, no lumps BREAST: breast exam declined GI: soft,  non-tender; no hepatosplenomegaly, masses; active bowel sounds all quadrants GU: GU exam declined Lymph: no cervical, axillary or inguinal adenopathy MSK: gait normal, muscle tone and strength WNL, no joint swelling, effusions, discoloration, crepitus  SKIN: clear, good turgor, color WNL, no rashes, lesions, or ulcerations Neuro: normal mental status, normal strength, sensation, and motion Psych: alert; oriented to person, place and time, normally interactive and not anxious or depressed in appearance.   All labs reviewed with patient. Results for orders placed or performed in visit on 12/21/22  Urinalysis, Routine w reflex microscopic  Result Value Ref Range   Color, Urine YELLOW Yellow;Lt. Yellow;Straw;Dark Yellow;Amber;Green;Red;Brown   APPearance CLEAR Clear;Turbid;Slightly Cloudy;Cloudy   Specific Gravity, Urine  1.015 1.000 - 1.030   pH 6.0 5.0 - 8.0   Total Protein, Urine NEGATIVE Negative   Urine Glucose NEGATIVE Negative   Ketones, ur NEGATIVE Negative   Bilirubin Urine NEGATIVE Negative   Hgb urine dipstick TRACE-INTACT (A) Negative   Urobilinogen, UA 0.2 0.0 - 1.0   Leukocytes,Ua NEGATIVE Negative   Nitrite NEGATIVE Negative   WBC, UA 0-2/hpf 0-2/hpf   RBC / HPF 0-2/hpf 0-2/hpf   Mucus, UA Presence of (A) None   Squamous Epithelial / HPF Few(5-10/hpf) (A) Rare(0-4/hpf)   Lab Review:     Latest Ref Rng & Units 12/14/2022    9:17 AM 07/27/2022   12:29 PM 09/29/2021    1:05 PM  CBC EXTENDED  WBC 4.0 - 10.5 K/uL 5.4  5.0  5.8   RBC 3.87 - 5.11 Mil/uL 5.11  5.02  3.72   Hemoglobin 12.0 - 15.0 g/dL 09.8  11.9  14.7   HCT 36.0 - 46.0 % 39.7  39.2  33.9   Platelets 150.0 - 400.0 K/uL 210.0  205  176.0   NEUT# 1.4 - 7.7 K/uL 3.0   3.1   Lymph# 0.7 - 4.0 K/uL 1.7   2.1        Latest Ref Rng & Units 12/14/2022    9:17 AM 07/27/2022   12:29 PM 09/29/2021    1:05 PM  BMP  Glucose 70 - 99 mg/dL 93  829  82   BUN 6 - 23 mg/dL 21  15  17    Creatinine 0.40 - 1.20 mg/dL 5.62   1.30  8.65   Sodium 135 - 145 mEq/L 139  142  136   Potassium 3.5 - 5.1 mEq/L 4.6  4.6  4.2   Chloride 96 - 112 mEq/L 104  106  101   CO2 19 - 32 mEq/L 28  27  26    Calcium 8.4 - 10.5 mg/dL 9.5  9.5  9.1        Latest Ref Rng & Units 12/14/2022    9:17 AM 07/27/2022   12:29 PM 09/29/2021    1:05 PM  Hepatic Function  Total Protein 6.0 - 8.3 g/dL 6.7  6.8  6.9   Albumin 3.5 - 5.2 g/dL 4.2  3.7  3.9   AST 0 - 37 U/L 21  19  27    ALT 0 - 35 U/L 11  11  32   Alk Phosphatase 39 - 117 U/L 88  78  103   Total Bilirubin 0.2 - 1.2 mg/dL 0.6  0.8  0.6   Bilirubin, Direct 0.0 - 0.3 mg/dL 0.1   0.1     Lab Results  Component Value Date   CHOL 186 12/14/2022   Lab Results  Component Value Date   HDL 85.80 12/14/2022   Lab Results  Component Value Date   LDLCALC 87 12/14/2022   Lab Results  Component Value Date   TRIG 66.0 12/14/2022   Lab Results  Component Value Date   CHOLHDL 2 12/14/2022   No results for input(s): "PSA" in the last 72 hours. No results found for: "HCVAB" Lab Results  Component Value Date   VD25OH 52.40 03/18/2020   VD25OH 41.08 02/18/2019   VD25OH 19.00 (L) 02/13/2018     Lab Results  Component Value Date   HGBA1C 5.7 12/14/2022   HGBA1C 5.8 02/18/2019   HGBA1C 5.5 03/19/2017   Lab Results  Component Value Date   LDLCALC 87 12/14/2022   CREATININE 0.88 12/14/2022  Assessment and Plan:     ICD-10-CM   1. Healthcare maintenance  Z00.00     2. Dysuria  R30.0 Urinalysis, Routine w reflex microscopic    3. Macular degeneration of both eyes, unspecified type  H35.30 Ambulatory referral to Home Health    4. Mixed stress and urge urinary incontinence  N39.46 Ambulatory referral to Home Health    5. Age-related physical debility  R54 Ambulatory referral to Home Health     Thank that she is having a lot of body wear and tear issues going along with age 54.  She is fatigued, and she is losing her eyesight due to macular degeneration.  Her  daughter thinks his memory is starting to go a little bit, as well.  Labs all look stable and normal.  I did my best to offer counsel.  I think that she is basically pretty stable.  Addendum: 02/28/23 10:24 AM  The patients daughter called in with some concerns.  Continues to have urinary incontinence.  To my knowledge, Medicare will not pay for adult diapers or incontinence supplies.   To to her overall physical debility, macular degeneration, urinary incontinence, daughter asks me to attempt to get a [facility] consult for [facility] aide.   Health Maintenance Exam: The patient's preventative maintenance and recommended screening tests for an annual wellness exam were reviewed in full today. Brought up to date unless services declined.  Counselled on the importance of diet, exercise, and its role in overall health and mortality. The patient's FH and SH was reviewed, including their home life, tobacco status, and drug and alcohol status.  Follow-up in 1 year for physical exam or additional follow-up below.  Disposition: Return in about 6 months (around 06/22/2023) for f/u Dr. Patsy Lager medical check-up, 6 mo.  Future Appointments  Date Time Provider Department Center  03/22/2023  3:00 PM Celso Amy, PA-C [facility] None  03/29/2023  2:15 PM Hyatt, Max T, DPM [facility]  08/02/2023 11:00 AM End, Cristal Deer, MD [facility] None  11/22/2023  1:30 PM [facility] ANNUAL WELLNESS VISIT 1 [facility]    No orders of the defined types were placed in this encounter.  Medications Discontinued During This Encounter  Medication Reason   molnupiravir EUA (LAGEVRIO) 200 MG CAPS capsule Completed Course   fluticasone (FLONASE) 50 MCG/ACT nasal spray Ineffective   ipratropium (ATROVENT) 0.06 % nasal spray Ineffective   cetirizine (ZYRTEC) 10 MG tablet Ineffective   Orders Placed This Encounter  Procedures   Urinalysis, Routine w reflex microscopic   Ambulatory referral to Home Health     Signed,  Karleen Hampshire T. Jeneen Doutt, MD   Allergies as of 12/21/2022       Reactions   Latex Rash   Tetanus Toxoid Anaphylaxis   REACTION: anaphylaxis   Tramadol Other (See Comments)   Caused severe constipation---bowel obstruction   Alendronate Sodium    REACTION: passed out        Medication List        Accurate as of December 21, 2022 11:59 PM. If you have any questions, ask your nurse or doctor.          STOP taking these medications    cetirizine 10 MG tablet Commonly known as: ZYRTEC Stopped by: Hannah Beat, MD   fluticasone 50 MCG/ACT nasal spray Commonly known as: FLONASE Stopped by: Hannah Beat, MD   ipratropium 0.06 % nasal spray Commonly known as: ATROVENT Stopped by: Hannah Beat, MD   molnupiravir EUA 200 MG Caps capsule Commonly  known as: LAGEVRIO Stopped by: Hannah Beat, MD       TAKE these medications    docusate sodium 100 MG capsule Commonly known as: COLACE Take 100 mg by mouth daily.   furosemide 20 MG tablet Commonly known as: LASIX Take 20mg  3 days per week PRN   ICAPS PO Take 1 capsule by mouth daily.   lisinopril 40 MG tablet Commonly known as: ZESTRIL Take 1 tablet by mouth once daily   pantoprazole 40 MG tablet Commonly known as: PROTONIX Take 1 tablet by mouth once daily   Vitamin D 50 MCG (2000 UT) Caps Take 1 capsule by mouth daily.   Xarelto 15 MG Tabs tablet Generic drug: Rivaroxaban TAKE 1 TABLET BY MOUTH ONCE DAILY WITH SUPPER

## 2022-12-21 ENCOUNTER — Encounter: Payer: Self-pay | Admitting: Podiatry

## 2022-12-21 ENCOUNTER — Ambulatory Visit: Payer: PPO | Admitting: Podiatry

## 2022-12-21 ENCOUNTER — Ambulatory Visit (INDEPENDENT_AMBULATORY_CARE_PROVIDER_SITE_OTHER): Payer: PPO | Admitting: Family Medicine

## 2022-12-21 ENCOUNTER — Encounter: Payer: Self-pay | Admitting: Family Medicine

## 2022-12-21 VITALS — BP 130/72 | HR 75 | Temp 98.3°F | Ht <= 58 in | Wt 110.5 lb

## 2022-12-21 DIAGNOSIS — B351 Tinea unguium: Secondary | ICD-10-CM

## 2022-12-21 DIAGNOSIS — Z0000 Encounter for general adult medical examination without abnormal findings: Secondary | ICD-10-CM | POA: Diagnosis not present

## 2022-12-21 DIAGNOSIS — M79676 Pain in unspecified toe(s): Secondary | ICD-10-CM | POA: Diagnosis not present

## 2022-12-21 DIAGNOSIS — N3946 Mixed incontinence: Secondary | ICD-10-CM | POA: Diagnosis not present

## 2022-12-21 DIAGNOSIS — R300 Dysuria: Secondary | ICD-10-CM | POA: Diagnosis not present

## 2022-12-21 DIAGNOSIS — H3530 Unspecified macular degeneration: Secondary | ICD-10-CM | POA: Diagnosis not present

## 2022-12-21 DIAGNOSIS — R54 Age-related physical debility: Secondary | ICD-10-CM | POA: Diagnosis not present

## 2022-12-21 LAB — URINALYSIS, ROUTINE W REFLEX MICROSCOPIC
Bilirubin Urine: NEGATIVE
Ketones, ur: NEGATIVE
Leukocytes,Ua: NEGATIVE
Nitrite: NEGATIVE
Specific Gravity, Urine: 1.015 (ref 1.000–1.030)
Total Protein, Urine: NEGATIVE
Urine Glucose: NEGATIVE
Urobilinogen, UA: 0.2 (ref 0.0–1.0)
pH: 6 (ref 5.0–8.0)

## 2022-12-21 NOTE — Progress Notes (Signed)
She presents today with her daughter chief complaint of painful elongated toenails.  Objective: Toenails are long thick yellow dystrophic with mycotic painful palpation.  Assessment: Pain limb secondary onychomycosis.  Plan: Debridement of toenails 1 through 5 bilateral.

## 2022-12-26 ENCOUNTER — Telehealth: Payer: Self-pay | Admitting: Family Medicine

## 2022-12-26 NOTE — Telephone Encounter (Signed)
Spoke with [name] and provided Department of Social Services office address and phone number to contact about applying for Medicaid for Ms. Hally.

## 2022-12-26 NOTE — Telephone Encounter (Signed)
Pt's daughter, [name], called requesting a call back from [name] or someone else that could help. [name] states she is trying to get the pt on Medicaid. [name] stated the pt already has Medicare. [name] states she needs advice on where to start with the process due to the pt's condition. Call back # [phone]

## 2022-12-29 ENCOUNTER — Telehealth: Payer: Self-pay | Admitting: Family Medicine

## 2022-12-29 NOTE — Telephone Encounter (Signed)
Pt's daughter, [name], called back returning Donna's call. Pt requested a call back after 3 due to her being at work. Call back # [phone]

## 2022-12-29 NOTE — Telephone Encounter (Signed)
Patients daughter called in to let Dr Patsy Lager know that patient had another episode where she was eating dinner and started to burp over and over again,very loudly. She said it seemed as if she was going to vomit,and she did but it was very large white bubbles,but no food. She spoke to Dr Patsy Lager once before about this issue ,but it really scared her ans she would like to know what she can do in the future if it happens again?

## 2022-12-29 NOTE — Telephone Encounter (Signed)
Left message for Paula Luna to return my call

## 2022-12-29 NOTE — Telephone Encounter (Addendum)
I spoke with Paula Luna (DPR signed) and Paula Luna notified as instructed and voiced understanding, Paula Luna is not sure if she wants to have a GI referral or not. Paula Luna wants to discuss with pt and [name]'s son. Paula Luna also will need to ck with insurance to make sure endoscopy is covered if GI would think necessary. Paula Luna will call back after discussing with family and let Dr Patsy Lager know. UC & ED precautions given and Paula Luna voiced understanding. Paula Luna said today pt is doing OK. Sending note to Dr Patsy Lager and [place] pool.

## 2022-12-29 NOTE — Telephone Encounter (Signed)
Please call  I am not sure if there is anything to do to prepare.  We could always have her see GI to see if they think doing an endoscopy would be worthwhile.  Sometimes older people need to have their esophagus stretched if it is too tight.

## 2023-01-01 ENCOUNTER — Other Ambulatory Visit: Payer: Self-pay | Admitting: Internal Medicine

## 2023-01-10 ENCOUNTER — Telehealth: Payer: Self-pay | Admitting: Internal Medicine

## 2023-01-10 NOTE — Telephone Encounter (Signed)
Pt daughter is calling in today.. she states there is some confusion on a $25 charge that was collected incorrectly and would like someone to call her to clarify this issue.   Pt was seen on 07/27/22 and daughter paid $25 however billing this morning told her it was paid incorrectly towards a hospital bill. She states whoever cashed out copay checked the incorrect box.   She spoke to hospital billing and they told her they do not have a record of her paying anything to them either, however her bank cleared the amount.   Pt has asked to speak to manager and billing to figure out where the money went and for someone to fix it. Please advise.

## 2023-01-12 NOTE — Telephone Encounter (Signed)
Patient daughter called again about some confusion on a $25 charge that was collected incorrectly. She spoke to hospital billing and they told her they do not have a record of her paying anything to them either, however her bank cleared the amount.

## 2023-01-13 NOTE — Telephone Encounter (Signed)
Left voicemail message to call back  

## 2023-01-25 ENCOUNTER — Ambulatory Visit: Payer: PPO | Admitting: Internal Medicine

## 2023-01-25 ENCOUNTER — Encounter: Payer: Self-pay | Admitting: Nurse Practitioner

## 2023-01-25 ENCOUNTER — Ambulatory Visit: Payer: PPO | Attending: Internal Medicine | Admitting: Nurse Practitioner

## 2023-01-25 VITALS — BP 168/90 | HR 63 | Ht <= 58 in | Wt 109.4 lb

## 2023-01-25 DIAGNOSIS — I4821 Permanent atrial fibrillation: Secondary | ICD-10-CM

## 2023-01-25 DIAGNOSIS — I878 Other specified disorders of veins: Secondary | ICD-10-CM | POA: Diagnosis not present

## 2023-01-25 DIAGNOSIS — I10 Essential (primary) hypertension: Secondary | ICD-10-CM

## 2023-01-25 DIAGNOSIS — N182 Chronic kidney disease, stage 2 (mild): Secondary | ICD-10-CM | POA: Diagnosis not present

## 2023-01-25 NOTE — Patient Instructions (Signed)
Medication Instructions:  No changes *If you need a refill on your cardiac medications before your next appointment, please call your pharmacy*   Lab Work: None ordered If you have labs (blood work) drawn today and your tests are completely normal, you will receive your results only by: MyChart Message (if you have MyChart) OR A paper copy in the mail If you have any lab test that is abnormal or we need to change your treatment, we will call you to review the results.   Testing/Procedures: None ordered   Follow-Up: At [facility], you and your health needs are our priority.  As part of our continuing mission to provide you with exceptional heart care, we have created designated Provider Care Teams.  These Care Teams include your primary Cardiologist (physician) and Advanced Practice Providers (APPs -  Physician Assistants and Nurse Practitioners) who all work together to provide you with the care you need, when you need it.  We recommend signing up for the patient portal called "MyChart".  Sign up information is provided on this After Visit Summary.  MyChart is used to connect with patients for Virtual Visits (Telemedicine).  Patients are able to view lab/test results, encounter notes, upcoming appointments, etc.  Non-urgent messages can be sent to your provider as well.   To learn more about what you can do with MyChart, go to [email or web address].    Your next appointment:   6 month(s)  Provider:   You may see Yvonne Kendall, MD or one of the following Advanced Practice Providers on your designated Care Team:   Nicolasa Ducking, NP Eula Listen, PA-C Cadence Fransico Michael, PA-C Charlsie Quest, NP    Other Instructions Please check your blood pressure daily.   It is best to check your BP 1-2 hours after taking your medications to see the medications effectiveness on your BP.    Here are some tips that our clinical pharmacists share for home BP monitoring:          Rest 10  minutes before taking your blood pressure.          Don't smoke or drink caffeinated beverages for at least 30 minutes before.          Take your blood pressure before (not after) you eat.          Sit comfortably with your back supported and both feet on the floor (don't cross your legs).          Elevate your arm to heart level on a table or a desk.          Use the proper sized cuff. It should fit smoothly and snugly around your bare upper arm. There should be enough room to slip a fingertip under the cuff. The bottom edge of the cuff should be 1 inch above the crease of the elbow.

## 2023-01-25 NOTE — Progress Notes (Signed)
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  Past Medical History    Past Medical History:  Diagnosis Date   CKD (chronic kidney disease) stage 3, GFR 30-59 ml/min (HCC) 10/04/2021   Hypertension 12/25/2013   Ischemic bowel disease (HCC)    a. 03/2017 abd pain-->internal herina and ischemic jejunum s/p ex lap and small bowel resection.   Macular degeneration of left eye 01/06/2015   Osteoporosis    Permanent atrial fibrillation (HCC)    a. First noted 03/2017;  b. 07/2017 Echo: EF 60-65%, no rwma, mild MR, mildly to mod dil LA; c. 07/2017 48hr Holter: persistent Afib, avg HR of 66 (range 39-140 bpm);  d. CHA2DS2VASc = 4-->Xarelto initiated 07/2017.   Past Surgical History:  Procedure Laterality Date   CLOSED REDUCTION PROXIMAL HUMERUS FRACTURE Left 2017   LAPAROSCOPIC SMALL BOWEL RESECTION     LAPAROTOMY N/A 03/18/2017   Procedure: EXPLORATORY LAPAROTOMY, SMALL BOWEL RESECTION;  Surgeon: Paula Ro, MD;  Location: [facility] ORS;  Service: General;  Laterality: N/A;   TOTAL ABDOMINAL HYSTERECTOMY      Allergies  Allergies  Allergen Reactions   Latex Rash   Tetanus Toxoid Anaphylaxis    REACTION: anaphylaxis   Tramadol Other (See Comments)    Caused severe constipation---bowel obstruction   Alendronate Sodium     REACTION: passed out    History of Present Illness    89 year old female with above past medical history including permanent atrial fibrillation, hypertension (permissive in the setting of history of lightheadedness and syncope), stage III  chronic kidney disease, osteoporosis, anemia, and 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  Atrial fibrillation was diagnosed in July 2018, when she was admitted to [facility] with abdominal pain and found to have an inguinal hernia with ischemic jejunum for which, exploratory laparotomy and small bowel resection was performed.  She was subsequently placed on oral anticoagulation (Xarelto), and has been managed with a rate control strategy.  In the summer 2022, she was hospitalized for small bowel obstruction complicated by syncope.  Subsequent event monitoring showed atrial fibrillation with borderline slow ventricular response, without prolonged pauses.  She developed epistaxis in the fall 2022, for which she saw ENT and underwent electrocautery.  Paula Luna was last seen in cardiology clinic in November 2023, at which time she reported mild leg swelling and discoloration, felt to be secondary to venous insufficiency.  She was encouraged to continue elevating her legs and use Lasix as needed.  ABIs were deferred in the absence of claudication symptoms.  Over the past six months, she has done reasonably well from a cardiac standpoint.  She does have some degree of chronic DOE w/ higher levels of activity, but can usually get around using her walker w/o much limitation.  She does not experience chest pain. She has chronic, mild ankle edema, for which she uses lasix prn (sparingly).  Her dtr notes that a few wks ago, she had an episode of nausea w/ a copious amt of mucoid vomitus.  No recurrence.  She plans to f/u w/ GI as she also has a lot of  belching and there is concern for esophageal stricture.  She denies palpitations, pnd, orthopnea, n, v, dizziness, syncope, weight gain, or early satiety.   Home Medications    Current Outpatient Medications  Medication Sig Dispense Refill   Cholecalciferol (VITAMIN D) 50 MCG (2000 UT) CAPS Take 1 capsule by mouth daily.     docusate sodium (COLACE)  100 MG capsule Take 100 mg by mouth daily.     furosemide (LASIX) 20 MG tablet Take 20mg  3 days per week PRN     lisinopril (ZESTRIL) 40 MG tablet Take 1 tablet by mouth once daily 90 tablet 0   Multiple Vitamins-Minerals (ICAPS PO) Take 1 capsule by mouth daily.     pantoprazole (PROTONIX) 40 MG tablet Take 1 tablet by mouth once daily 90 tablet 3   Rivaroxaban (XARELTO) 15 MG TABS tablet TAKE 1 TABLET BY MOUTH ONCE DAILY WITH SUPPER 90 tablet 0   No current facility-administered medications for this visit.     Review of Systems    Chronic, stable DOE and mild ankle edema.  Episode of nausea w/ plan to f/u w/ GI.  She denies chest pain, palpitations, pnd, orthopnea, n, v, dizziness, syncope, weight gain, or early satiety.   All other systems reviewed and are otherwise negative except as noted above.    Physical Exam    VS:  BP (!) 195/76 (BP Location: Right Arm, Patient Position: Sitting, Cuff Size: Normal)   Pulse 63   Ht 4\' 9"  (1.448 m)   Wt 109 lb 6.4 oz (49.6 kg)   BMI 23.67 kg/m  , BMI Body mass index is 23.67 kg/m.     Vitals:   01/25/23 1324 01/25/23 1412  BP: (!) 195/76 (!) 168/90  Pulse: 63     GEN: Well nourished, well developed, in no acute distress. HEENT: normal. Neck: Supple, no JVD, carotid bruits, or masses. Cardiac: IR, IR, no murmurs, rubs, or gallops. No clubbing, cyanosis, trace bilat lower ext edema w/ discoloration and varicose veins consistent with venous insufficiency.  Radials 2+/PT 1+ and equal bilaterally.  Respiratory:  Respirations regular and unlabored, clear to auscultation bilaterally. GI: Soft, nontender, nondistended, BS + x 4. MS: no deformity or atrophy. Skin: warm and dry, no rash. Neuro:  Strength and sensation are intact. Psych: Normal affect.  Accessory Clinical Findings    ECG personally reviewed by me today - Afib, 63, septal infarct, inf TWI - no acute changes.  Lab Results  Component Value Date   WBC 5.4 12/14/2022   HGB  12.6 12/14/2022   HCT 39.7 12/14/2022   MCV 77.8 (L) 12/14/2022   PLT 210.0 12/14/2022   Lab Results  Component Value Date   CREATININE 0.88 12/14/2022   BUN 21 12/14/2022   NA 139 12/14/2022   K 4.6 12/14/2022   CL 104 12/14/2022   CO2 28 12/14/2022   Lab Results  Component Value Date   ALT 11 12/14/2022   AST 21 12/14/2022   ALKPHOS 88 12/14/2022   BILITOT 0.6 12/14/2022   Lab Results  Component Value Date   CHOL 186 12/14/2022   HDL 85.80 12/14/2022   LDLCALC 87 12/14/2022   LDLDIRECT 101.2 05/20/2009   TRIG 66.0 12/14/2022   CHOLHDL 2 12/14/2022    Lab Results  Component Value Date   HGBA1C 5.7 12/14/2022    Assessment & Plan    1.  Essential HTN: Blood pressure elevated today.  When she arrived, she had a full bladder and pressures  in the 190s.  Following voiding, pressure did come down some but still elevated overall-168/90.  Review of recent outpatient blood pressure measurements shows that she typically trends in the 130s.  They do have a cuff at home.  Continue lisinopril 40 mg daily.  Daughter will take blood pressures daily over the next 2 weeks and notify us of trends.  Typically, we have allowed for permissive hypertension in the setting of orthostasis and prior history of syncope.  If pressures remain markedly elevated, she is a poor candidate for amlodipine due to chronic, mild lower extremity swelling.  She prefers to avoid thiazide/loop diuretic therapy as she already feels as though she urinates too much and only uses as needed Lasix once a week or less.  Relative bradycardia likely prohibitive for use of beta-blockers.  Could consider spironolactone or alpha-blocker.  2.  Permanent Afib: Rate controlled in the absence of AV nodal blocking agent.  Anticoagulated with Xarelto with normal labs in April.  3.  Chronic venous stasis: Only trace edema on exam today.  Overall stable.  She uses Lasix sparingly as needed and keeps her legs elevated throughout the  day.  Not routinely wearing compression.  Careful with salt.  4.  Nausea and belching: Plans to follow-up with GI.  She will not require any cardiovascular evaluation prior to EGD, if this becomes necessary.  5.  Stage II-III chronic kidney disease: Creatinine stable at 0.88 in April.  She is on ACE inhibitor therapy.  6.  Disposition: Follow-up in clinic in 6 months or sooner if necessary.  Daughter will contact us with blood pressure trends over the next 2 weeks.  Nicolasa Ducking, NP 01/25/2023, 1:43 PM

## 2023-01-26 ENCOUNTER — Telehealth: Payer: Self-pay | Admitting: Family Medicine

## 2023-01-26 NOTE — Telephone Encounter (Signed)
Patient's daughter contacted the office regarding an issue on her mother's problem list  CKD (chronic kidney disease) stage 3, GFR 30-59 ml/min (HCC)  states she has some questions/ concerns regarding this and this is the first time she has heard of her mother having this issue. Would like a call back regarding this at 3:30 or after today, please advise daughter [name] [phone] ( okay per DPR)

## 2023-01-26 NOTE — Telephone Encounter (Signed)
Please call  Some chronic kidney disease is normal for anyone above 80.  I may have not specifically said "chronic kidney disease" - I am not sure.  I often use other terms like mild renal impairment or mild kidney impairment, but it is not a new problem, and there is nothing to be overly concerned about.  At her age, it is very unlikely to worsen.

## 2023-01-26 NOTE — Telephone Encounter (Signed)
[name] notified as instructed by telephone.  States understanding.

## 2023-02-07 ENCOUNTER — Other Ambulatory Visit: Payer: Self-pay | Admitting: Internal Medicine

## 2023-02-07 DIAGNOSIS — I4821 Permanent atrial fibrillation: Secondary | ICD-10-CM

## 2023-02-07 NOTE — Telephone Encounter (Signed)
Prescription refill request for Xarelto received.  Indication:afib Last office visit:5/24 Weight:49.6  kg Age:89 Scr:0.8 CrCl:32.21  ml/min  Prescription refilled

## 2023-02-07 NOTE — Telephone Encounter (Signed)
Refill request

## 2023-02-13 ENCOUNTER — Telehealth: Payer: Self-pay | Admitting: Family Medicine

## 2023-02-13 DIAGNOSIS — R1310 Dysphagia, unspecified: Secondary | ICD-10-CM

## 2023-02-13 NOTE — Telephone Encounter (Signed)
Lvm asking pt's daughter, [name] (on dpr), to call back. Need to relay Dr. Cyndie Chime message. Once he places GI referral, they will be contacted to schedule an appt.

## 2023-02-13 NOTE — Telephone Encounter (Signed)
Patient's daughter contacted the office regarding the patient. Says she had another episode where she was burping/ vomiting again. Says the vomit looks like mucus, daughter wants to know if they could speak to Dr. Patsy Lager about a referral to GI if possible. Patient's daughter says she is feeling okay today, wants to know if Dr. Patsy Lager had any suggestions on what to do. Please advise, thank you. [phone]

## 2023-02-13 NOTE — Telephone Encounter (Signed)
Pt's daughter, [name], called returning Lisa's call. Patrica Duel, Dr. Cyndie Chime response. [name] stated she tried giving he pt soft foods such as broth, soft scrabbled eggs, etc, nothing works. [name] stated the pt seemed to be fine yesterday, 6/2. Call back # [phone]

## 2023-02-13 NOTE — Telephone Encounter (Signed)
Since it is a repeating pattern, I think that we might want GI to take a look at her.   For now, I would try to eat softer foods to see if this makes a different.  Relatively bland diet at least over the next few days - avoid spicy and heavy foods.

## 2023-02-14 NOTE — Telephone Encounter (Signed)
Called [name] back. She did not have any further questions. She did not need call back. I do not see where referral was placed was going to give [name] information to call for appointment.

## 2023-02-14 NOTE — Telephone Encounter (Signed)
It looks like [name] might want a call back?  I have placed a GI consultation for them to discuss further.  Other than what I already shared, I do not really have any other suggestions.

## 2023-02-14 NOTE — Addendum Note (Signed)
Addended by: Hannah Beat on: 02/14/2023 06:06 PM   Modules accepted: Orders

## 2023-02-28 ENCOUNTER — Telehealth: Payer: Self-pay | Admitting: Family Medicine

## 2023-02-28 DIAGNOSIS — I10 Essential (primary) hypertension: Secondary | ICD-10-CM

## 2023-02-28 DIAGNOSIS — Z5986 Financial insecurity: Secondary | ICD-10-CM

## 2023-02-28 DIAGNOSIS — N3946 Mixed incontinence: Secondary | ICD-10-CM

## 2023-02-28 DIAGNOSIS — I4821 Permanent atrial fibrillation: Secondary | ICD-10-CM

## 2023-02-28 NOTE — Telephone Encounter (Signed)
Please call back  Medicare policies do not cover adult diapers or any incontinence supplies.  I am sorry.   It is very challenging to get [facility] aides approved for bathing assistance, etc., but I have placed a home health consultation to see if they can assist Paula Luna.  I wish it were easy to make that happen, but it is not.

## 2023-02-28 NOTE — Telephone Encounter (Signed)
Patients daughter would like to know if her mom could receive an order to receive diapers? She would also like to know if she could have orders place for a home health aide to come in and help bathe her and sit with her for a little while? She's not familiar with too many HH agencies,she said that if  Dr Patsy Lager knows a good one that they can use ,that would be fine.

## 2023-02-28 NOTE — Addendum Note (Signed)
Addended by: Hannah Beat on: 02/28/2023 10:24 AM   Modules accepted: Orders

## 2023-02-28 NOTE — Telephone Encounter (Signed)
[name] notified as instructed by telephone.  Ms. Kennel now has Medicaid and she was told that PCP could write order for adult diapers to [facility] and they would be covered.  Also a home health aide to bath and sit with patient would also be covered.  She mentioned 2 companies: [facility] and [facility].  She will call back with 3 name of agency they have also used in the past.    New Medicaid # [id number] L

## 2023-03-01 NOTE — Telephone Encounter (Signed)
Orders for Adult Diapers faxed to [facility] at [phone].

## 2023-03-01 NOTE — Telephone Encounter (Signed)
There was a Typo with name of medical supply company.  It should have been [facility].

## 2023-03-01 NOTE — Addendum Note (Signed)
Addended by: Hannah Beat on: 03/01/2023 09:34 AM   Modules accepted: Orders

## 2023-03-01 NOTE — Telephone Encounter (Signed)
Please call  I have already made a home health consultation.   I am not sure about the adult diapers.  I cannot find the company called "[facility]."  I will supply [name] with a written prescription, so if they can provide a FAX number, then we will send in the prescription.   I am going to get one of the hospital case managers and social workers call Paula Luna and Paula Luna to see what they can do to assist with any medical supplies, medications, and incontinence supplies.

## 2023-03-02 NOTE — Addendum Note (Signed)
Addended by: Damita Lack on: 03/02/2023 02:28 PM   Modules accepted: Orders

## 2023-03-02 NOTE — Addendum Note (Signed)
Addended by: Damita Lack on: 03/02/2023 04:12 PM   Modules accepted: Orders

## 2023-03-02 NOTE — Addendum Note (Signed)
Addended by: Damita Lack on: 03/02/2023 04:15 PM   Modules accepted: Orders

## 2023-03-03 NOTE — Addendum Note (Signed)
Addended by: Damita Lack on: 03/03/2023 10:45 AM   Modules accepted: Orders

## 2023-03-06 NOTE — Telephone Encounter (Signed)
Patients daughter would like a phone call to let her know the medical supply company where the order was sent out for the diapers. And she have a name of an agency that said that they would come out an do an assessment on her. She is requesting a phone after 3pm.

## 2023-03-06 NOTE — Telephone Encounter (Signed)
Spoke with [name].  She would like to use either [facility] or [facility] for the home health aide.  Will forward to our referral coordinator to see they can set that up for patient. [name] also notified that order for adult diapers were faxed to [facility] out of [place]. I also let her know that Dr. Patsy Lager also put in referral for Social Worker to also help to  access patient's needs and help with resources.

## 2023-03-06 NOTE — Telephone Encounter (Signed)
We do not coordinate Home Health Aids through [employer]. I have called the patient family, spoke with [name], and they are aware that they will need to contact [facility] directly to discuss care needed and set up an initial evaluation date. These private agencies discuss directly with the patient/family the out of pocket costs and insurance coverages based on services provided.   [name] states that they have used [facility] in the passed and really like them. I gave her the number for the [facility] location and she is going to contact them directly to discuss services needed. She is going to call the office if there is a need for anything to be faxed to [place] or the other company she is requesting.   The referral for Social Work will be addressed with the patient directly from [facility]/Social Worker - these referrals are internal to that department.  Chrystal Land, LCSW is the Child psychotherapist and George Ina, RN may be able to assist with this as well.

## 2023-03-08 ENCOUNTER — Telehealth: Payer: Self-pay | Admitting: Family Medicine

## 2023-03-08 NOTE — Telephone Encounter (Signed)
Form placed in Dr. Copland's office in box to complete. 

## 2023-03-08 NOTE — Telephone Encounter (Signed)
Completed for faxed to [phone].

## 2023-03-08 NOTE — Telephone Encounter (Signed)
Patient's daughter contacted the office regarding forms for her mom's care. States that she is needing a [id number] form filled out in order for someone from medicaid to come to evaluate and see how many hours patient will need for a home health aide. I printed off copy of forms and placed in Dr. Cyndie Chime box. Once complete please fax to number on front. Thank you

## 2023-03-08 NOTE — Telephone Encounter (Signed)
Done

## 2023-03-09 ENCOUNTER — Telehealth: Payer: Self-pay

## 2023-03-09 NOTE — Progress Notes (Signed)
  Chronic Care Management   Note  03/09/2023 Name: Paula Luna MRN: [id number] DOB: July 16, 1925  Paula Luna is a 89 y.o. year old female who is a primary care patient of Copland, Karleen Hampshire, MD. I reached out to [name] by phone today in response to a referral sent by Paula Luna's PCP.  Paula Luna was given information about Chronic Care Management services today including:  CCM service includes personalized support from designated clinical staff supervised by the physician, including individualized plan of care and coordination with other care providers 24/7 contact phone numbers for assistance for urgent and routine care needs. Service will only be billed when office clinical staff spend 20 minutes or more in a month to coordinate care. Only one practitioner may furnish and bill the service in a calendar month. The patient may stop CCM services at amy time (effective at the end of the month) by phone call to the office staff. The patient will be responsible for cost sharing (co-pay) or up to 20% of the service fee (after annual deductible is met)  Paula Luna  agreedto scheduling an appointment with the CCM RN Case Manager   Follow up plan: Patient agreed to scheduled appointment with RN Case Manager on 03/15/2023(date/time).   Penne Lash, RMA Care Guide [facility]  [place], [place] [address] Direct Dial: [phone] [email or web address]

## 2023-03-10 DIAGNOSIS — I872 Venous insufficiency (chronic) (peripheral): Secondary | ICD-10-CM | POA: Diagnosis not present

## 2023-03-10 DIAGNOSIS — Z7901 Long term (current) use of anticoagulants: Secondary | ICD-10-CM | POA: Diagnosis not present

## 2023-03-10 DIAGNOSIS — D631 Anemia in chronic kidney disease: Secondary | ICD-10-CM | POA: Diagnosis not present

## 2023-03-10 DIAGNOSIS — K589 Irritable bowel syndrome without diarrhea: Secondary | ICD-10-CM | POA: Diagnosis not present

## 2023-03-10 DIAGNOSIS — N183 Chronic kidney disease, stage 3 unspecified: Secondary | ICD-10-CM | POA: Diagnosis not present

## 2023-03-10 DIAGNOSIS — Z9181 History of falling: Secondary | ICD-10-CM | POA: Diagnosis not present

## 2023-03-10 DIAGNOSIS — I4821 Permanent atrial fibrillation: Secondary | ICD-10-CM | POA: Diagnosis not present

## 2023-03-10 DIAGNOSIS — Z90710 Acquired absence of both cervix and uterus: Secondary | ICD-10-CM | POA: Diagnosis not present

## 2023-03-10 DIAGNOSIS — H3530 Unspecified macular degeneration: Secondary | ICD-10-CM | POA: Diagnosis not present

## 2023-03-10 DIAGNOSIS — I129 Hypertensive chronic kidney disease with stage 1 through stage 4 chronic kidney disease, or unspecified chronic kidney disease: Secondary | ICD-10-CM | POA: Diagnosis not present

## 2023-03-10 DIAGNOSIS — M810 Age-related osteoporosis without current pathological fracture: Secondary | ICD-10-CM | POA: Diagnosis not present

## 2023-03-10 DIAGNOSIS — Z556 Problems related to health literacy: Secondary | ICD-10-CM | POA: Diagnosis not present

## 2023-03-13 NOTE — Telephone Encounter (Signed)
Spoke with [employer].  They have the order but it has not been processed yet.  They will notified [place] office and get someone to start working on it.

## 2023-03-13 NOTE — Telephone Encounter (Signed)
No.  Immodium is reasonable.

## 2023-03-13 NOTE — Telephone Encounter (Signed)
Spoke with [name] and let her know that I followed up on the diaper order and they did receive it, but it hasn't been processed yet.  They were going to reach out to [place], [place] location and get someone working on this order. While on the phone,  [name] states her mom had diarrhea this morning and is asking if there is anything she can give her.  I recommend that she try OTC imodium. She does have and appointment with GI next so I mentioned that if the diarrhea continues to address it with the GI next week.  Will forward to Dr. Patsy Lager in case he as any further recommendations.

## 2023-03-13 NOTE — Telephone Encounter (Signed)
Pt's daughter, [name], called checking status of order for pt's diapers? [name] states the pt is going through a lot of diapers on a daily basis & she can no longer afford the cost of diapers. [name] stated a order needs to be sent to [facility] for diapers. [name] stated a LPN from [facility] came to visit the pt last week. [name] states she works between 12pm-4pm, so if someone needs to speak with her, its best to call before or after her work hours. Call back # [phone]

## 2023-03-15 ENCOUNTER — Ambulatory Visit: Payer: PPO

## 2023-03-15 DIAGNOSIS — H353114 Nonexudative age-related macular degeneration, right eye, advanced atrophic with subfoveal involvement: Secondary | ICD-10-CM | POA: Diagnosis not present

## 2023-03-15 DIAGNOSIS — H18523 Epithelial (juvenile) corneal dystrophy, bilateral: Secondary | ICD-10-CM | POA: Diagnosis not present

## 2023-03-15 DIAGNOSIS — K589 Irritable bowel syndrome without diarrhea: Secondary | ICD-10-CM

## 2023-03-15 DIAGNOSIS — I872 Venous insufficiency (chronic) (peripheral): Secondary | ICD-10-CM

## 2023-03-15 DIAGNOSIS — N183 Chronic kidney disease, stage 3 unspecified: Secondary | ICD-10-CM

## 2023-03-15 DIAGNOSIS — I4821 Permanent atrial fibrillation: Secondary | ICD-10-CM

## 2023-03-15 DIAGNOSIS — D631 Anemia in chronic kidney disease: Secondary | ICD-10-CM

## 2023-03-15 DIAGNOSIS — I129 Hypertensive chronic kidney disease with stage 1 through stage 4 chronic kidney disease, or unspecified chronic kidney disease: Secondary | ICD-10-CM

## 2023-03-15 DIAGNOSIS — Z90710 Acquired absence of both cervix and uterus: Secondary | ICD-10-CM

## 2023-03-15 DIAGNOSIS — M810 Age-related osteoporosis without current pathological fracture: Secondary | ICD-10-CM

## 2023-03-15 DIAGNOSIS — Z556 Problems related to health literacy: Secondary | ICD-10-CM

## 2023-03-15 DIAGNOSIS — Z9181 History of falling: Secondary | ICD-10-CM

## 2023-03-15 DIAGNOSIS — H04123 Dry eye syndrome of bilateral lacrimal glands: Secondary | ICD-10-CM | POA: Diagnosis not present

## 2023-03-15 DIAGNOSIS — Z7901 Long term (current) use of anticoagulants: Secondary | ICD-10-CM

## 2023-03-15 DIAGNOSIS — H353124 Nonexudative age-related macular degeneration, left eye, advanced atrophic with subfoveal involvement: Secondary | ICD-10-CM | POA: Diagnosis not present

## 2023-03-15 DIAGNOSIS — H3530 Unspecified macular degeneration: Secondary | ICD-10-CM

## 2023-03-15 DIAGNOSIS — H35363 Drusen (degenerative) of macula, bilateral: Secondary | ICD-10-CM | POA: Diagnosis not present

## 2023-03-20 ENCOUNTER — Telehealth: Payer: Self-pay | Admitting: Family Medicine

## 2023-03-20 NOTE — Telephone Encounter (Signed)
Left message for Paula Luna to return my call.  Needs to know size of pull ups patient wears.

## 2023-03-20 NOTE — Telephone Encounter (Signed)
Marissa from [facility] called over and stated that she with [name] and informed her that [facility] have the supplies she needs and that Medicare/Medicaid will cover them. She stated that she isn't sure if [facility] accepts medicaid or medicare but she will reach out to them and find out. She can be reached at [phone] with any questions. Thank you!

## 2023-03-20 NOTE — Telephone Encounter (Signed)
Spoke with [employer].  Orders/office note and demographics re-faxed to [facility] location at [phone].  They are going to fax over paperwork that is required for Medicaid for [place] to complete and fax back.

## 2023-03-20 NOTE — Telephone Encounter (Signed)
Spoke with Paula Luna and advised there was additional Medicaid paperwork that needed to be filled out for Paula Luna's diapers.  Paula Luna faxed those over today and they were completed and faxed back to Paula Luna at [phone]. As far as the home health aide,  I advised that we submitted the required paperwork to Medicaid and she would just need to call and follow up with the agency she wanted to use to see where things stands as far as an getting an Aide to come to their home.   She also stated that her mom has a dry patch of skin under one of her eyes.  She removed it but now it is back again.  I recommended that she put a little dab of Vaseline on that area at bedtime to see if that helps.  Paula Luna states understanding.    Paula Luna younger sister also recently passed away so she is the only surviving sibling.    FYI to Dr Paula Luna.

## 2023-03-20 NOTE — Telephone Encounter (Signed)
Patient daughter called in and stated that Paula Luna was suppose to have an order for diapers. She stated that they haven't heard or received anything in regards to this. Please advise. Thank you!

## 2023-03-20 NOTE — Telephone Encounter (Signed)
Patient daughter also had questions regarding HH order and when it will kick in.

## 2023-03-21 NOTE — Telephone Encounter (Signed)
Agreed -

## 2023-03-21 NOTE — Telephone Encounter (Signed)
I would think that we should continue with the company that we have already been in contact with and change if they are unable to meet her needs.

## 2023-03-22 ENCOUNTER — Ambulatory Visit: Payer: Self-pay | Admitting: *Deleted

## 2023-03-22 ENCOUNTER — Encounter: Payer: Self-pay | Admitting: Physician Assistant

## 2023-03-22 ENCOUNTER — Ambulatory Visit (INDEPENDENT_AMBULATORY_CARE_PROVIDER_SITE_OTHER): Payer: PPO | Admitting: Physician Assistant

## 2023-03-22 VITALS — BP 189/73 | HR 79 | Temp 97.6°F | Ht <= 58 in | Wt 106.4 lb

## 2023-03-22 DIAGNOSIS — R1310 Dysphagia, unspecified: Secondary | ICD-10-CM | POA: Diagnosis not present

## 2023-03-22 DIAGNOSIS — K219 Gastro-esophageal reflux disease without esophagitis: Secondary | ICD-10-CM | POA: Diagnosis not present

## 2023-03-22 DIAGNOSIS — R1013 Epigastric pain: Secondary | ICD-10-CM | POA: Diagnosis not present

## 2023-03-22 DIAGNOSIS — R142 Eructation: Secondary | ICD-10-CM | POA: Diagnosis not present

## 2023-03-22 NOTE — Patient Outreach (Signed)
  Care Coordination   Initial Visit Note   03/22/2023 Name: Paula Luna MRN: [id number] DOB: [birth year]  Paula Luna is a 89 y.o. year old female who sees Copland, Karleen Hampshire, MD for primary care. I spoke with  Paula Luna 's daughter by phone today.  What matters to the patients health and wellness today?  Care coordination-community resource needs. Patient's daughter is the main caregiver for patient and is interested in resources for assistance.    Goals Addressed             This Visit's Progress    Care Coordination-assist with community resource needs       Interventions Today    Flowsheet Row Most Recent Value  Chronic Disease   Chronic disease during today's visit Other  [caregiver stress]  General Interventions   General Interventions Discussed/Reviewed General Interventions Discussed, [facility], Level of Care  [Daugher confirms being primary caregiver for patient and is need of supportive services. Discussed options for respite care-provided contact for [facility] [phone]  Level of Care Personal Care Services, Adult Daycare, Skilled Nursing Facility  [patient has assessment scheduled for personal care services on 05/08/23  [facility] services and bath aid through [facility] patient's daughter declines resources for Day Programs and Skilled Nursing]  Mental Health Interventions   Mental Health Discussed/Reviewed Mental Health Discussed  [Emotional support provided to patient's daughter, self care encouraged]              SDOH assessments and interventions completed:  Yes  SDOH Interventions Today    Flowsheet Row Most Recent Value  SDOH Interventions   Food Insecurity Interventions Intervention Not Indicated  Transportation Interventions Intervention Not Indicated  Utilities Interventions Intervention Not Indicated        Care Coordination Interventions:  Yes, provided   Follow up plan: Follow up call scheduled for 04/05/23     Encounter Outcome:  Pt. Visit Completed

## 2023-03-22 NOTE — Patient Instructions (Signed)
Upper GI series scheduled 04/05/23 @ [facility] 8:15 am  Medical Mall entrance. Nothing to eat/drink after midnight.

## 2023-03-22 NOTE — Progress Notes (Signed)
Celso Amy, PA-C [address]  [address]  [place], [place] [address]  Main: [phone]  Fax: [phone]   Gastroenterology Consultation  Referring Provider:     Hannah Beat, MD Primary Care Physician:  Hannah Beat, MD Primary Gastroenterologist:  Celso Amy, PA-C / Dr. Wyline Mood   Reason for Consultation:     GERD        HPI:   Paula Luna is a 89 y.o. y/o female referred for consultation & management  by Hannah Beat, MD.    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    July 2018, when she was admitted to [facility] with abdominal pain and found to have an inguinal hernia with ischemic jejunum for which, exploratory laparotomy and small bowel resection was performed. In the summer 2022, she was hospitalized for small bowel obstruction complicated by syncope.   Chronic DOE w/ higher levels of activity, but can usually get around using her walker w/o much limitation. She does not experience chest pain. Her dtr notes she has had 3 - 4 episodes of nausea w/ a copious amt of mucoid vomitus.  She has a lot of belching and there is concern for esophageal stricture.  She does have difficulty swallowing food which feels like it is getting stuck in the epigastrium and sits in her stomach for a while.  She has not had any food getting stuck in her chest.  She has dentures and cannot chew well.  Avoids eating large pieces of meat or bread.  Daughter is cutting her food up into very small pieces.  Pantoprazole helped initially, but is no longer helping.  Past Medical History:  Diagnosis Date   CKD (chronic kidney disease) stage 3, GFR 30-59 ml/min (HCC) 10/04/2021   Hypertension 12/25/2013   Ischemic bowel disease (HCC)    a. 03/2017 abd pain-->internal herina  and ischemic jejunum s/p ex lap and small bowel resection.   Macular degeneration of left eye 01/06/2015   Osteoporosis    Permanent atrial fibrillation (HCC)    a. First noted 03/2017;  b. 07/2017 Echo: EF 60-65%, no rwma, mild MR, mildly to mod dil LA; c. 07/2017 48hr Holter: persistent Afib, avg HR of 66 (range 39-140 bpm);  d. CHA2DS2VASc = 4-->Xarelto initiated 07/2017.    Past Surgical History:  Procedure Laterality Date   CLOSED REDUCTION PROXIMAL HUMERUS FRACTURE Left 2017   LAPAROSCOPIC SMALL BOWEL RESECTION     LAPAROTOMY N/A 03/18/2017   Procedure: EXPLORATORY LAPAROTOMY, SMALL BOWEL RESECTION;  Surgeon: Leafy Ro, MD;  Location: [facility] ORS;  Service: General;  Laterality: N/A;   TOTAL ABDOMINAL HYSTERECTOMY      Prior to Admission medications   Medication Sig Start Date End Date Taking? Authorizing Provider  Cholecalciferol (VITAMIN D) 50 MCG (2000 UT) CAPS Take 1 capsule by mouth daily.   Yes [provider]  docusate sodium (COLACE) 100 MG capsule Take 100 mg by mouth daily.   Yes [provider]  furosemide (LASIX) 20 MG tablet Take 20mg  3 days per week PRN   Yes [provider]  lisinopril (ZESTRIL) 40 MG tablet Take 1 tablet by mouth once daily 01/02/23  Yes End, Cristal Deer, MD  Multiple Vitamins-Minerals (ICAPS PO) Take  1 capsule by mouth daily.   Yes [provider]  pantoprazole (PROTONIX) 40  MG tablet Take 1 tablet by mouth once daily 10/02/22  Yes Copland, Karleen Hampshire, MD  Rivaroxaban (XARELTO) 15 MG TABS tablet TAKE 1 TABLET BY MOUTH ONCE DAILY WITH SUPPER 02/07/23  Yes End, Cristal Deer, MD    Family History  Problem Relation Age of Onset   Diabetes Mother    Cancer Father        jaw     Social History   Tobacco Use   Smoking status: Former   Smokeless tobacco: Never  Vaping Use   Vaping Use: Never used  Substance Use Topics   Alcohol use: No   Drug use: No    Allergies as of 03/22/2023 - Review Complete 03/22/2023   Allergen Reaction Noted   Latex Rash 11/07/2011   Tetanus toxoid Anaphylaxis 05/10/2010   Tramadol Other (See Comments) 12/30/2020   Alendronate sodium  07/12/2010    Review of Systems:    All systems reviewed and negative except where noted in HPI.   Physical Exam:  BP (!) 189/73   Pulse 79   Temp 97.6 F (36.4 C)   Ht 4\' 8"  (1.422 m)   Wt 106 lb 6.4 oz (48.3 kg)   BMI 23.85 kg/m  No LMP recorded. Patient has had a hysterectomy. Psych:  Alert and cooperative. Normal mood and affect. General:   Feeble, elderly, thin, female; Walks with a walker; pleasant and cooperative in NAD; he is not able to get onto the exam table. Head:  Normocephalic and atraumatic. Eyes:  Sclera clear, no icterus.   Conjunctiva pink.  Decreased Vision. Lungs:  Respirations even and unlabored.  Clear throughout to auscultation.   No wheezes, crackles, or rhonchi. No acute distress. Heart:  no murmurs, clicks, rubs, or gallops. Abdomen:  Soft, and non-distended without masses, hepatosplenomegaly or hernias noted.  No Tenderness.    Psych:  Alert and cooperative. Normal mood and affect.  Imaging Studies: No results found.  Assessment and Plan:   Paula Luna is a 89 y.o. y/o female has been referred for acid reflux, dysphagia, epigastric pain, and belching.  She has had increased progressive episodes in the past few months.  Feels like food is sticking in her epigastrium.  Differential includes esophageal dysmotility, hiatal hernia, GERD, esophageal stricture, peptic ulcer/gastritis.  I am ordering upper GI series with barium tablet.  Continue PPI for acid reflux.  1.  GERD  Continue pantoprazole 40 Mg 1 tablet once daily.  Recommend Lifestyle Modifications to prevent Acid Reflux.  Rec. Avoid coffee, sodas, peppermint, citrus fruits, and spicey foods.  Avoid eating 2-3 hours before bedtime.   Stay sitting up right at least 1 to 2 hours after eating.  2.  Dysphagia  Upper GI series with barium  tablet  3.  Epigastric pain  Upper GI series with barium tablet  4.  Belching  Treat GERD  Follow up in 4-6 weeks with TG.  Celso Amy, PA-C

## 2023-03-22 NOTE — Patient Instructions (Signed)
Visit Information  Thank you for taking time to visit with me today. Please don't hesitate to contact me if I can be of assistance to you.   Following are the goals we discussed today:  Please contact [facility]-respite care program  [phone]   Our next appointment is by telephone on 04/05/23 at 1pm  Please call the care guide team at [phone] if you need to cancel or reschedule your appointment.   If you are experiencing a Mental Health or Behavioral Health Crisis or need someone to talk to, please call 911   Patient verbalizes understanding of instructions and care plan provided today and agrees to view in MyChart. Active MyChart status and patient understanding of how to access instructions and care plan via MyChart confirmed with patient.     Telephone follow up appointment with care management team member scheduled for: 04/05/23  Verna Czech, LCSW Clinical Social Worker  [facility] Care Management [phone]

## 2023-03-27 ENCOUNTER — Telehealth: Payer: Self-pay | Admitting: Family Medicine

## 2023-03-27 NOTE — Telephone Encounter (Signed)
FYI: This call has been transferred to Access Nurse. Once the result note has been entered staff can address the message at that time.  Patient called in with the following symptoms:  Red Word:elevated blood pressure Patient daughter called in and stated that Nixon blood pressure has been elevated. She stated that some of her readings have\ been as high as 190 or 200. She stated that she checked it and it was 179/88 and she had been experiencing some dizziness. She stated that she lost her sister recently.   Please advise at Mobile [phone] (mobile)  Message is routed to Provider Pool and [facility] Triage

## 2023-03-27 NOTE — Telephone Encounter (Signed)
I spoke with [name]; offered sooner appt for pt to be seen but [name] prefers pt only see Dr Patsy Lager and appt was scheduled with Dr Patsy Lager on 04/06/23 at 9:40 AM with UC & ED precautions given and [name] voiced understanding. [name] said if necessary she will cb if pt condition changes or worsens. Sending not to  Dr Patsy Lager and [facility] pool.

## 2023-03-27 NOTE — Telephone Encounter (Signed)
I called [name] and she said she was at work and could not talk and I advised her to call [facility] when she is able to talk and schedule appt for pt per access note. Sending note to Dr Patsy Lager who is out of office and [facility] pool

## 2023-03-27 NOTE — Telephone Encounter (Signed)
Pt's daughter, [name], called back returning Rena's call. Told pt Artelia Laroche would return her call shortly. Call back # [phone]

## 2023-03-29 ENCOUNTER — Ambulatory Visit: Payer: PPO | Admitting: Podiatry

## 2023-03-30 ENCOUNTER — Other Ambulatory Visit: Payer: Self-pay | Admitting: Internal Medicine

## 2023-04-04 ENCOUNTER — Telehealth: Payer: Self-pay | Admitting: Family Medicine

## 2023-04-04 NOTE — Telephone Encounter (Signed)
Spoke with Paula Luna.  They confirmed the order has been received.  She will get this processed and give [name] a courtesy call.  [name] notified of this via telephone.

## 2023-04-04 NOTE — Telephone Encounter (Signed)
Orders re-faxed to [facility] once again.  [name] notified of this via telephone.  I will call [facility] later today to confirm they received this fax.

## 2023-04-04 NOTE — Telephone Encounter (Signed)
Pt's daughter, [name], called stating [facility] in [place] stated they haven't received any rx from Dr. Patsy Lager for the pt's diapers. [name] requested a call back from [name] to discuss the outcome of the rx being sent? [facility]'s fax # is [phone]. Call back # [phone]

## 2023-04-04 NOTE — Progress Notes (Unsigned)
    Shaena Parkerson T. Jaizon Deroos, MD, CAQ Sports Medicine [facility] at [facility] [address] [place], [address]  Phone: [phone]  FAX: [phone]  LURIE MULLANE - 89 y.o. female  MRN [id number]  Date of Birth: [birth year]  Date: 04/06/2023  PCP: Hannah Beat, MD  Referral: Hannah Beat, MD  No chief complaint on file.  Subjective:   CRISTABEL BICKNELL is a 89 y.o. very pleasant female patient with There is no height or weight on file to calculate BMI. who presents with the following:  She is here to follow-up about some recent high blood pressures.  She did have some blood pressures in the 170s over 90 range when she was at a specialist last week.  She is here to follow-up with me about this today.    Review of Systems is noted in the HPI, as appropriate  Objective:   There were no vitals taken for this visit.  GEN: No acute distress; alert,appropriate. PULM: Breathing comfortably in no respiratory distress PSYCH: Normally interactive.   Laboratory and Imaging Data:  Assessment and Plan:   ***

## 2023-04-05 ENCOUNTER — Ambulatory Visit: Payer: Self-pay | Admitting: *Deleted

## 2023-04-05 ENCOUNTER — Other Ambulatory Visit: Payer: Self-pay | Admitting: Physician Assistant

## 2023-04-05 ENCOUNTER — Ambulatory Visit
Admission: RE | Admit: 2023-04-05 | Discharge: 2023-04-05 | Disposition: A | Discharge status: Home / Self Care | Payer: PPO | Source: Ambulatory Visit | Attending: Physician Assistant | Admitting: Physician Assistant

## 2023-04-05 DIAGNOSIS — R1013 Epigastric pain: Secondary | ICD-10-CM | POA: Diagnosis not present

## 2023-04-05 DIAGNOSIS — R142 Eructation: Secondary | ICD-10-CM

## 2023-04-05 DIAGNOSIS — K219 Gastro-esophageal reflux disease without esophagitis: Secondary | ICD-10-CM | POA: Diagnosis not present

## 2023-04-05 DIAGNOSIS — K224 Dyskinesia of esophagus: Secondary | ICD-10-CM | POA: Diagnosis not present

## 2023-04-05 DIAGNOSIS — R1310 Dysphagia, unspecified: Secondary | ICD-10-CM

## 2023-04-05 NOTE — Patient Instructions (Signed)
Visit Information  Thank you for taking time to visit with me today. Please don't hesitate to contact me if I can be of assistance to you.   Following are the goals we discussed today:  Please continue to follow up with the Caregiver Support program through [facility] [place] for incontinent supplies and respite care.    If you are experiencing a Mental Health or Behavioral Health Crisis or need someone to talk to, please call 911   Patient verbalizes understanding of instructions and care plan provided today and agrees to view in MyChart. Active MyChart status and patient understanding of how to access instructions and care plan via MyChart confirmed with patient.     No further follow up required: Patient's daughter to call this Child psychotherapist with any additional community resource needds  [place], [place] Clinical Social Worker  [facility] Care Management [phone]

## 2023-04-05 NOTE — Patient Outreach (Signed)
  Care Coordination   Follow Up Visit Note   04/05/2023 Name: LULAR LETSON MRN: [id number] DOB: October 08, 1924  [name] Gattuso is a 89 y.o. year old female who sees Copland, Karleen Hampshire, MD for primary care. I spoke with  [name] by phone today.  What matters to the patients health and wellness today?  Caregiver support and resources   Goals Addressed             This Visit's Progress    Care Coordination-assist with community resource needs       Interventions Today    Flowsheet Row Most Recent Value  Chronic Disease   Chronic disease during today's visit Other  [caregiver stress]  General Interventions   General Interventions Discussed/Reviewed General Interventions Reviewed, [facility], Level of Care  [Pt's daugther states that [facility] services have been extended 3 more weeks including the bath aid. Contact made with the Caregiver support program through [facility], incontinent supplies provided(2 bags) along with gloves, ensure and skin rash cream.]  Level of Care Personal Care Services  [In home assessment for personal care services scheduled for 05/08/23]  Mental Health Interventions   Mental Health Discussed/Reviewed Mental Health Reviewed  [Self care continues to be emphasized to manage caregiver stress]              SDOH assessments and interventions completed:  No     Care Coordination Interventions:  Yes, provided   Follow up plan: No further intervention required.   Encounter Outcome:  Pt. Visit Completed

## 2023-04-05 NOTE — Progress Notes (Signed)
Discuss benefits vs risks of EGD and if willing proceed with EGD since we do not know what's going on in the inside- do explain about increased risk of anesthesia/procedure in view of her age

## 2023-04-05 NOTE — Progress Notes (Signed)
Please call and notify patient barium swallow test shows: 1.  Barium tablet did not pass through the distal esophagus, possibly due to lower esophageal stricture. 2.  Moderate esophageal dysmotility with abnormal esophageal contractions. 3.  No evidence of hiatal hernia or GERD noted. **I discussed her results with Dr. Tobi Bastos.  Continue pantoprazole 40 Mg once daily for acid reflux.  If patient agrees, then go ahead and schedule EGD with possible esophageal dilation with Dr. Tobi Bastos.  Risk of the procedure includes esophageal perforation, bleeding, or risk of sedation.  She would be at increased risk of procedure given her advanced age.  We would need permission to hold Xarelto prior to the EGD procedure.  If patient does not want to schedule the EGD, then schedule follow-up OV with me or Dr. Tobi Bastos to further discuss.

## 2023-04-06 ENCOUNTER — Other Ambulatory Visit: Payer: Self-pay | Admitting: Family Medicine

## 2023-04-06 ENCOUNTER — Ambulatory Visit (INDEPENDENT_AMBULATORY_CARE_PROVIDER_SITE_OTHER): Payer: PPO | Admitting: Family Medicine

## 2023-04-06 ENCOUNTER — Telehealth: Payer: Self-pay

## 2023-04-06 ENCOUNTER — Encounter: Payer: Self-pay | Admitting: Family Medicine

## 2023-04-06 VITALS — BP 166/90 | HR 83 | Temp 98.2°F | Ht <= 58 in | Wt 107.2 lb

## 2023-04-06 DIAGNOSIS — I10 Essential (primary) hypertension: Secondary | ICD-10-CM

## 2023-04-06 DIAGNOSIS — R1310 Dysphagia, unspecified: Secondary | ICD-10-CM | POA: Diagnosis not present

## 2023-04-06 MED ORDER — SPIRONOLACTONE 25 MG PO TABS: 25.0000 mg | ORAL_TABLET | Freq: Every day | ORAL | 1 refills | Status: DC

## 2023-04-06 MED ORDER — AMLODIPINE BESYLATE 5 MG PO TABS: 5.0000 mg | ORAL_TABLET | Freq: Every day | ORAL | 1 refills | Status: DC

## 2023-04-06 NOTE — Telephone Encounter (Signed)
Spoke with patient and discussed all results. Scheduled patient for OV to discuss EGD.   1.  Barium tablet did not pass through the distal esophagus, possibly due to lower esophageal stricture.  2.  Moderate esophageal dysmotility with abnormal esophageal contractions.  3.  No evidence of hiatal hernia or GERD noted.  **I discussed her results with Dr. Tobi Bastos.  Continue pantoprazole 40 Mg once daily for acid reflux.  If patient agrees, then go ahead and schedule EGD with possible esophageal dilation with Dr. Tobi Bastos.  Risk of the procedure includes esophageal perforation, bleeding, or risk of sedation.  She would be at increased risk of procedure given her advanced age.  We would need permission to hold Xarelto prior to the EGD procedure.  If patient does not want to schedule the EGD, then schedule follow-up OV with me or Dr. Tobi Bastos to further discuss.

## 2023-04-06 NOTE — Telephone Encounter (Signed)
  Pharmacy comment: DRUG INTERACTION BETWEEN SPIRONOLACTONE AND LISINOPRIL  (INCREASED RISK OF HYPERKALEMIA). PLEASE ADVISE. THANKS.

## 2023-04-06 NOTE — Telephone Encounter (Signed)
Can you call pharmacy and let them and [name] know that I decided to change the new blood pressure medication to Amlodipine, since there is an interaction with the spironolactone and her other BP med lisinopril.

## 2023-04-06 NOTE — Addendum Note (Signed)
Addended by: Hannah Beat on: 04/06/2023 12:50 PM   Modules accepted: Orders

## 2023-04-06 NOTE — Telephone Encounter (Signed)
Paula notified by telephone that Dr. Patsy Lager changed Paula Luna's medication from the spironolactone to amlodipine 5 mg.  Paula Luna had already contacted her and she has already picked up the amlodipine.

## 2023-04-11 ENCOUNTER — Telehealth: Payer: Self-pay | Admitting: Family Medicine

## 2023-04-11 NOTE — Telephone Encounter (Signed)
Home Health verbal orders Caller Name: stephanie  Agency Name: [facility]   Callback number: [phone], secured    Requesting OT/PT/Skilled nursing/Social Work/Speech: OT   Reason:  Frequency: once a week for 1 week, 2 times a week for 2 weeks, once a week for 1 week   Please forward to [facility] pool or providers CMA

## 2023-04-11 NOTE — Telephone Encounter (Signed)
I think [name] is looking at Pulse and not oxygen level.  Will forward to Dr. Patsy Lager to review BP readings.

## 2023-04-11 NOTE — Telephone Encounter (Signed)
[name] notified as instructed by telephone.  She states understanding.

## 2023-04-11 NOTE — Telephone Encounter (Signed)
Left voicemail for Judeth Cornfield giving verbal orders for OT 1 x week for 1 week, then 2 x week for 2 weeks, then 1 x week for 1 week.

## 2023-04-11 NOTE — Telephone Encounter (Signed)
Patient's daughter called in, wanted to make Dr. Patsy Lager aware of patient's blood pressure since taking amlodipine. [name] states patient's most recent blood pressure readings have been 141/76, 135/72, 131/67. [name] states the oxygen levels with her blood pressure have been concerning, says they have been reading at 58, 66 and 59. [name] says that the patient is not in any distress during the reading, she is not experiencing any SOB or wheezing. [name] says patient seems fine, but she was concerned regarding the oxygen levels. [name] wanted to leave this message for [facility] to keep him informed, says she can be reached at her mobile number anytime after 3

## 2023-04-11 NOTE — Telephone Encounter (Signed)
Please call  Blood pressure looks perfect.  Keep up with current medications.   I suspect that the number she is looking at is probably her pulse.  She has always had normal 02 in the office.   She would be in severe distress if oxygen level was that low, so I think that would be extremely unlikely since she looks normal.

## 2023-04-12 ENCOUNTER — Telehealth: Payer: Self-pay

## 2023-04-12 NOTE — Telephone Encounter (Signed)
Faxed new order for small pull ups to [facility] at [phone].

## 2023-04-12 NOTE — Telephone Encounter (Signed)
Daughter called patient pull ups but it was the wrong ones. Will need new order for small pull ups. The ones that were received were way to large and have tabs to close she is not able to see. She spoke to [facility] and states that they need order that has to sates pull up small.

## 2023-04-12 NOTE — Telephone Encounter (Signed)
Verbal orders giving to [facility] for nursing 1 x week for 3 weeks.

## 2023-04-12 NOTE — Telephone Encounter (Signed)
Home Health verbal orders Caller Name: Lamar Laundry  Agency Name: [facility]   Callback number: [phone], secured  Requesting OT/PT/Skilled nursing/Social Work/Speech: nursing   Reason:  Frequency: once a week for three weeks   Please forward to [facility] pool or providers CMA

## 2023-04-12 NOTE — Telephone Encounter (Signed)
Please correct and send back.

## 2023-04-14 NOTE — Chronic Care Management (AMB) (Signed)
Error Please Disregard

## 2023-04-17 NOTE — Telephone Encounter (Signed)
Daughter [name] called requesting information regarding upcoming appts for pt (mother)... Ok per HIPAA.... answered questions regarding recent Barium swallow and upcoming appts  Nothing further needed at this time

## 2023-04-24 ENCOUNTER — Other Ambulatory Visit: Payer: Self-pay

## 2023-04-24 ENCOUNTER — Ambulatory Visit (INDEPENDENT_AMBULATORY_CARE_PROVIDER_SITE_OTHER): Payer: PPO | Admitting: Podiatry

## 2023-04-24 ENCOUNTER — Encounter: Payer: Self-pay | Admitting: Podiatry

## 2023-04-24 DIAGNOSIS — M79676 Pain in unspecified toe(s): Secondary | ICD-10-CM

## 2023-04-24 DIAGNOSIS — B351 Tinea unguium: Secondary | ICD-10-CM

## 2023-04-24 NOTE — Patient Outreach (Signed)
  Care Management   Visit Note  04/24/2023 Name: JONAS SCARPITTI MRN: [id number] DOB: March 23, 1925  Subjective: COTRINA DANZEY is a 89 y.o. year old female who is a primary care patient of Copland, Karleen Hampshire, MD. The Care Management team was consulted for assistance.       PLAN:  A HIPAA compliant phone message was left for the patient providing contact information and requesting a return call.   [facility]/Care Management [phone]

## 2023-04-24 NOTE — Progress Notes (Signed)
She presents today chief complaint of painful elongated toenails.  Objective: Vital signs stable oriented x 3 pulses are palpable.  Toenails are long thick yellow dystrophic clinically mycotic.  Assessment: Pain limb secondary to onychomycosis.  Plan: Debridement of toenails 1 through 5 bilateral

## 2023-04-25 ENCOUNTER — Telehealth: Payer: Self-pay | Admitting: Family Medicine

## 2023-04-25 NOTE — Telephone Encounter (Signed)
Home Health verbal orders Caller Name:tanya  [facility]  Callback number: [phone]  Requesting OT/PT/Skilled nursing/Social Work/Speech: medical social work evaluation   Reason; help with care giver resources and assistance program     Frequency:1wk 4  Please forward to [facility] pool or providers CMA

## 2023-04-25 NOTE — Telephone Encounter (Signed)
Left message for Paula Luna giving verbal orders for Social Worker Evaluation 1 x week for 4 weeks to help with care giver resources and assistance program.

## 2023-05-02 NOTE — Progress Notes (Unsigned)
Celso Amy, PA-C [address]  [address]  [place], [place] [address]  Main: [phone]  Fax: [phone]   Primary Care Physician: Hannah Beat, MD  Primary Gastroenterologist:  Celso Amy, PA-C / Dr. Wyline Mood    CC: Follow-up GERD, dysphagia, epigastric pain, and belching  HPI: Paula Luna is a 89 y.o. female returns for 5-week follow-up of GERD, dysphagia, gastric pain, and belching.  He has been taking pantoprazole 40 Mg once daily.  Also working on lifestyle modification to prevent acid reflux.  She is here today with her daughter.  Patient is currently feeling better.  She has no more upper abdominal pain.  Has not had any episode of solid food dysphagia since her last appointment.  She is chewing her food well, taking small bites, avoiding large pieces of meat and bread.  Eating softer foods.  She denies heartburn or chest pain.  Still has occasional belching.  Upper GI series 04/05/2023 showed a barium tablet lodged in the distal esophagus.  There was concern for distal esophageal stricture.  Torturous and mildly patulous esophagus.  Moderate esophageal dysmotility.  Medical history significant for A-fib on Xarelto, hypertension, CKD 3, ischemic bowel s/p small bowel resection in 2018.  In 2022 had mild SBO which resolved with conservative treatment.  Lab 12/2022 showed stable improved hemoglobin 12.6.  Normal BMP and LFTs.  Current Outpatient Medications  Medication Sig Dispense Refill   amLODipine (NORVASC) 5 MG tablet Take 1 tablet (5 mg total) by mouth daily. 90 tablet 1   Cholecalciferol (VITAMIN D) 50 MCG (2000 UT) CAPS Take 1 capsule by mouth daily.     docusate sodium (COLACE) 100 MG capsule Take 100 mg by mouth daily.     furosemide (LASIX) 20 MG tablet Take 20mg  3 days per week PRN     lisinopril (ZESTRIL) 40 MG tablet Take 1 tablet by mouth once daily 90 tablet 3   Multiple Vitamins-Minerals (ICAPS PO) Take 1 capsule by mouth daily.      pantoprazole (PROTONIX) 40 MG tablet Take 1 tablet by mouth once daily 90 tablet 3   Rivaroxaban (XARELTO) 15 MG TABS tablet TAKE 1 TABLET BY MOUTH ONCE DAILY WITH SUPPER 90 tablet 1   No current facility-administered medications for this visit.    Allergies as of 05/03/2023 - Review Complete 05/03/2023  Allergen Reaction Noted   Latex Rash 11/07/2011   Tetanus toxoid Anaphylaxis 05/10/2010   Tramadol Other (See Comments) 12/30/2020   Alendronate sodium  07/12/2010    Past Medical History:  Diagnosis Date   CKD (chronic kidney disease) stage 3, GFR 30-59 ml/min (HCC) 10/04/2021   Hypertension 12/25/2013   Ischemic bowel disease (HCC)    a. 03/2017 abd pain-->internal herina and ischemic jejunum s/p ex lap and small bowel resection.   Macular degeneration of left eye 01/06/2015   Osteoporosis    Permanent atrial fibrillation (HCC)    a. First noted 03/2017;  b. 07/2017 Echo: EF 60-65%, no rwma, mild MR, mildly to mod dil LA; c. 07/2017 48hr Holter: persistent Afib, avg HR of 66 (range 39-140 bpm);  d. CHA2DS2VASc = 4-->Xarelto initiated 07/2017.    Past Surgical History:  Procedure Laterality Date   CLOSED REDUCTION PROXIMAL HUMERUS FRACTURE Left 2017   LAPAROSCOPIC SMALL BOWEL RESECTION     LAPAROTOMY N/A 03/18/2017   Procedure: EXPLORATORY LAPAROTOMY, SMALL BOWEL RESECTION;  Surgeon: Leafy Ro, MD;  Location: [facility] ORS;  Service: General;  Laterality: N/A;  TOTAL ABDOMINAL HYSTERECTOMY      Review of Systems:    All systems reviewed and negative except where noted in HPI.   Physical Examination:   BP 128/82   Pulse 67   Temp 97.7 F (36.5 C) (Oral)   Ht 4\' 8"  (1.422 m)   Wt 107 lb 8 oz (48.8 kg)   BMI 24.10 kg/m   General: Well-nourished, well-developed, feeble elderly female, in no acute distress.  Neuro: Alert and oriented x 3.  Grossly intact. Walks with a walker. Psych: Alert and cooperative, normal mood and affect.   Imaging Studies: DG UGI W DOUBLE CM (HD  BA)  Result Date: 04/05/2023 CLINICAL DATA:  Provided history: Gastroesophageal reflux disease, unspecified whether esophagitis present. Abdominal pain, epigastric. GERD. Epigastric pain. Additional history provided: The patient reports dysphagia, epigastric fullness. EXAM: UPPER GI SERIES WITH HIGH DENSITY WITHOUT KUB TECHNIQUE: A combined double and single contrast examination was performed using effervescent crystals, high-density barium and thin liquid barium. Additionally, the patient swallowed a 13 mm barium tablet under fluoroscopy. The exam was performed by Alwyn Ren, NP, and was supervised and interpreted by Dr. Jackey Loge. FLUOROSCOPY: Radiation Exposure Index (as provided by the fluoroscopic device): 36.20 mGy Kerma COMPARISON:  Abdominal radiograph 04/03/2021. CT abdomen/pelvis 04/03/2021. FINDINGS: Limited examination due to the patient's limited ability to reposition on the fluoroscopy table. A swallowed 13 mm barium tablet did not pass beyond the distal esophagus despite a prolonged period of observation and despite the patient drinking additional water, as well as liquid barium contrast. Tortuous and mildly patulous esophagus more proximally. Moderate intermittent esophageal dysmotility with tertiary contractions. No appreciable hiatal hernia. No gastroesophageal reflux observed. Unremarkable appearance of the stomach, duodenal bulb and duodenal sweep. No evidence of ulceration, fold thickening or mass. IMPRESSION: 1. Limited examination due to the patient's limited ability to reposition on the fluoroscopy table. 2. A swallowed 13 mm barium tablet did not pass beyond the level of the distal esophagus despite a prolonged period of observation, and despite the patient drinking additional water as well as liquid barium contrast. A distal esophageal stricture cannot be excluded. Consider endoscopy for further evaluation. 3. Tortuous and mildly patulous esophagus more proximally. 4. Moderate  esophageal dysmotility with tertiary contractions. 5. Otherwise unremarkable upper GI series, as described. Electronically Signed   By: Jackey Loge D.O.   On: 04/05/2023 10:23    Assessment and Plan:   Paula Luna is a 89 y.o. y/o female returns for follow-up of:  1.  Dysphagia I discussed risks of EGD and esophageal dilation with patient and her daughter to include risk of bleeding, perforation, and risk of sedation.  Patient and her daughter expressed understanding.  Patient declines to schedule an EGD at this time because her symptoms have improved.  If she has recurrent solid food dysphagia episode, then she will reconsider scheduling EGD.  2.  Abnormal barium swallow; possible distal esophageal stricture, barium tablet would not pass through lower esophagus  Patient declined to schedule EGD today.  Symptoms currently improved.  Risk and benefits discussed at length.  Continue soft diet.  3.  Esophageal dysmotility  I gave patient education handout from [facility] regarding esophageal dysmotility. Recommend eat small frequent meals.  Eat sitting up.  Try Gaviscon 30 mL after each meal.   4.  GERD  Continue pantoprazole 40 Mg daily and lifestyle modification  5.  Permanent A-fib on Xarelto  If she decides to schedule an EGD with dilation, then  we will need to obtain cardiac clearance and permission to hold Xarelto from her cardiologist Dr. Okey Dupre.   Celso Amy, PA-C  Follow up as needed based on GI symptoms.  She will call back if she decides to schedule EGD or if she has recurrent dysphagia.

## 2023-05-03 ENCOUNTER — Encounter: Payer: Self-pay | Admitting: Physician Assistant

## 2023-05-03 ENCOUNTER — Ambulatory Visit (INDEPENDENT_AMBULATORY_CARE_PROVIDER_SITE_OTHER): Payer: PPO | Admitting: Physician Assistant

## 2023-05-03 VITALS — BP 128/82 | HR 67 | Temp 97.7°F | Ht <= 58 in | Wt 107.5 lb

## 2023-05-03 DIAGNOSIS — I4821 Permanent atrial fibrillation: Secondary | ICD-10-CM | POA: Diagnosis not present

## 2023-05-03 DIAGNOSIS — K224 Dyskinesia of esophagus: Secondary | ICD-10-CM

## 2023-05-03 DIAGNOSIS — K219 Gastro-esophageal reflux disease without esophagitis: Secondary | ICD-10-CM

## 2023-05-03 DIAGNOSIS — K222 Esophageal obstruction: Secondary | ICD-10-CM

## 2023-05-03 DIAGNOSIS — R1310 Dysphagia, unspecified: Secondary | ICD-10-CM | POA: Diagnosis not present

## 2023-05-03 NOTE — Patient Instructions (Signed)
Chew your food well, Eat soft foods, take small bites. Avoid large pieces of meat or bread. Call us back if you have recurrent dysphagia or if you decide you would like to schedule an Upper Endoscopy with esophageal dilation.

## 2023-05-11 ENCOUNTER — Encounter: Payer: Self-pay | Admitting: Family Medicine

## 2023-05-24 ENCOUNTER — Ambulatory Visit: Payer: PPO | Admitting: Physician Assistant

## 2023-06-13 ENCOUNTER — Telehealth: Payer: Self-pay | Admitting: Family Medicine

## 2023-06-13 NOTE — Telephone Encounter (Signed)
Please call [name]  Yes, I think that would be reasonable to transition to the [facility] Plus/Zinc Oxide.  Watch is and make sure that it does not get worse.

## 2023-06-13 NOTE — Telephone Encounter (Signed)
Patient daughter called in and stated that a aide comes in 4 days a week. She stated that the aide informed her that she has lost a lot of weight and her stomach is laying over her bottom. She stated that she has a big red mark under the skin now and the skin is peeling. She stated that they have been putting [facility] on it and they received some Selan Plus (ZINC OXIDE). She was wanting to know if they should use that once the [facility] runs out or continue using corn starch. Please advise. Thank you!

## 2023-06-21 ENCOUNTER — Telehealth: Payer: Self-pay | Admitting: Family Medicine

## 2023-06-21 NOTE — Telephone Encounter (Signed)
You would need to see patient in office or virtually to fill out right?

## 2023-06-21 NOTE — Telephone Encounter (Signed)
Called patient daughter she is not sure if mom would like now. She would like hold off on form until they come in for flu shot at end of month. Have placed PPW in yellow folder at [employer].

## 2023-06-21 NOTE — Telephone Encounter (Signed)
Patients daughter wanted to make Dr Patsy Lager aware that according to the Home health nurse her mom is down to 91 pounds. She would like to know how would she go about receiving a DNR sign to put above her bed? Please advise.

## 2023-06-21 NOTE — Telephone Encounter (Signed)
I have completed the patient's DNR Paula Luna sheet, and we are sending it to [place].  Updating DNR status in Epic.

## 2023-06-26 ENCOUNTER — Other Ambulatory Visit: Payer: Self-pay | Admitting: Family

## 2023-07-12 ENCOUNTER — Ambulatory Visit: Payer: 59 | Admitting: Family Medicine

## 2023-07-12 ENCOUNTER — Ambulatory Visit: Payer: 59

## 2023-07-14 ENCOUNTER — Ambulatory Visit (INDEPENDENT_AMBULATORY_CARE_PROVIDER_SITE_OTHER): Payer: 59

## 2023-07-14 DIAGNOSIS — Z23 Encounter for immunization: Secondary | ICD-10-CM | POA: Diagnosis not present

## 2023-07-17 ENCOUNTER — Telehealth: Payer: Self-pay | Admitting: Family Medicine

## 2023-07-17 NOTE — Telephone Encounter (Signed)
Patient daughter Paula Luna called in and stated that Medicaid will be faxing over a form that needs to be filled out. She stated that it is regarding an order for diapers. Thank you!

## 2023-07-17 NOTE — Telephone Encounter (Signed)
Noted  

## 2023-07-26 ENCOUNTER — Ambulatory Visit (INDEPENDENT_AMBULATORY_CARE_PROVIDER_SITE_OTHER): Payer: 59 | Admitting: Podiatry

## 2023-07-26 ENCOUNTER — Encounter: Payer: Self-pay | Admitting: Podiatry

## 2023-07-26 DIAGNOSIS — M79676 Pain in unspecified toe(s): Secondary | ICD-10-CM | POA: Diagnosis not present

## 2023-07-26 DIAGNOSIS — B351 Tinea unguium: Secondary | ICD-10-CM

## 2023-07-26 NOTE — Progress Notes (Signed)
She presents today chief complaint of painful elongated toenails.  Objective: Vital signs are stable oriented x 3 pulses are palpable mild edema about the foot.  Toenails are slightly elongated sharply incurvated thickened painful palpation as well as debridement.  Assessment: Pain limb secondary to onychomycosis.  Plan: Debridement of toenails 1 through 5 bilateral.

## 2023-08-02 ENCOUNTER — Ambulatory Visit: Payer: 59 | Attending: Internal Medicine | Admitting: Internal Medicine

## 2023-08-02 ENCOUNTER — Encounter: Payer: Self-pay | Admitting: Internal Medicine

## 2023-08-02 VITALS — BP 142/80 | HR 63 | Ht <= 58 in | Wt 107.8 lb

## 2023-08-02 DIAGNOSIS — Z79899 Other long term (current) drug therapy: Secondary | ICD-10-CM

## 2023-08-02 DIAGNOSIS — R600 Localized edema: Secondary | ICD-10-CM | POA: Diagnosis not present

## 2023-08-02 DIAGNOSIS — I10 Essential (primary) hypertension: Secondary | ICD-10-CM | POA: Diagnosis not present

## 2023-08-02 DIAGNOSIS — I4821 Permanent atrial fibrillation: Secondary | ICD-10-CM | POA: Diagnosis not present

## 2023-08-02 DIAGNOSIS — R1319 Other dysphagia: Secondary | ICD-10-CM | POA: Diagnosis not present

## 2023-08-02 NOTE — Patient Instructions (Signed)
Medication Instructions:  Your physician recommends that you continue on your current medications as directed. Please refer to the Current Medication list given to you today.   *If you need a refill on your cardiac medications before your next appointment, please call your pharmacy*   Lab Work: Your provider would like for you to have following labs drawn today (CBC, BMP).     Testing/Procedures: No test ordered today    Follow-Up: At [facility], you and your health needs are our priority.  As part of our continuing mission to provide you with exceptional heart care, we have created designated Provider Care Teams.  These Care Teams include your primary Cardiologist (physician) and Advanced Practice Providers (APPs -  Physician Assistants and Nurse Practitioners) who all work together to provide you with the care you need, when you need it.  We recommend signing up for the patient portal called "MyChart".  Sign up information is provided on this After Visit Summary.  MyChart is used to connect with patients for Virtual Visits (Telemedicine).  Patients are able to view lab/test results, encounter notes, upcoming appointments, etc.  Non-urgent messages can be sent to your provider as well.   To learn more about what you can do with MyChart, go to [email or web address].    Your next appointment:   6 month(s)  Provider:   You may see Yvonne Kendall, MD or one of the following Advanced Practice Providers on your designated Care Team:   Nicolasa Ducking, NP Eula Listen, PA-C Cadence Fransico Michael, PA-C Charlsie Quest, NP Carlos Levering, NP

## 2023-08-02 NOTE — Progress Notes (Signed)
Cardiology Office Note:  .   Date:  08/02/2023  ID:  Paula Luna, DOB Sep 07, 1925, MRN [id number] PCP: Paula Beat, MD  [facility] Providers Cardiologist:  Paula Kendall, MD     History of Present Illness: .   Paula Luna is a 89 y.o. female  with history of permanent atrial fibrillation, hypertension, ischemic bowel status post bowel resection with subsequent hospitalization for SBO, osteoporosis, and anemia, who presents for follow-up of permanent atrial fibrillation and hypertension.  She was last seen in our office in May by Paula Givens, NP,   Dizzy ehwn first wakes up.  Feels lightheaded with it.  Wonders if could be due to eyes.  Has macular degeneration.  One eye already not good.  No palpitations.  No CP.  SOB once in a while if tries to wlk too far.  Doesn't walk too much.  Occ LE edema.  Sometimes foot looks purple.  No sores.  No abdominal issues.  Having esophageal problems.  May need dilation.  Reluctant to proceed.  Only couple episodes of dysphagia since then.  Couple of times regurgitated.  Once had food, other times only clear stuff.  At that time, she complained of nausea/vomiting with copious amounts of mucoid emesis.  She subsequently underwent barium swallow and was noted to have patulous esophagus with moderate dysmotility and failure of barium tablet to pass the distal esophagus suspicious for stricture.  She was offered EGD with possible esophageal dilation by GI but elected to defer.  Today, Paula Luna reports that she continues to have some dysphagia and mucoid emesis, though the frequency has decreased considerably over the last few months.  She denies chest pain, shortness of breath, and palpitations.  She has intermittent orthostatic lightheadedness but has not passed out or fallen.  She denies bleeding but bruises easily.  She notes mild ankle edema and intermittent purplish discoloration of her calves/feet, similar to prior visits.  She remains  compliant with her medications, including rivaroxaban.  ROS: See HPI  Studies Reviewed: Marland Kitchen   EKG Interpretation Date/Time:  Wednesday August 02 2023 11:13:58 EST Ventricular Rate:  63 PR Interval:    QRS Duration:  68 QT Interval:  398 QTC Calculation: 407 R Axis:   -14  Text Interpretation: Atrial fibrillation Septal infarct (cited on or before 02-Apr-2021) When compared with ECG of 25-Jan-2023 No significant change was found Confirmed by Paula Luna [phone]) on 08/02/2023 1:25:54 PM    Risk Assessment/Calculations:    CHA2DS2-VASc Score = 4   This indicates a 4.8% annual risk of stroke. The patient's score is based upon: CHF History: 0 HTN History: 1 Diabetes History: 0 Stroke History: 0 Vascular Disease History: 0 Age Score: 2 Gender Score: 1     Physical Exam:   VS:  BP (!) 142/80 (BP Location: Left Arm, Patient Position: Sitting, Cuff Size: Normal)   Pulse 63   Ht 4\' 10"  (1.473 m)   Wt 107 lb 12.8 oz (48.9 kg)   BMI 22.53 kg/m    Wt Readings from Last 3 Encounters:  08/02/23 107 lb 12.8 oz (48.9 kg)  05/03/23 107 lb 8 oz (48.8 kg)  04/06/23 107 lb 4 oz (48.6 kg)    General:  NAD.  Accompanied by her daughter Neck: No JVD or HJR. Lungs: Clear to auscultation bilaterally without wheezes or crackles. Heart: Irregularly irregular rhythm without murmurs. Abdomen: Soft, nontender, nondistended. Extremities: Trace ankle edema bilaterally.  ASSESSMENT AND PLAN: .    Permanent  atrial fibrillation: Ventricular rates remain appropriate in the office off AV-nodal blocking agents.  Continue rivaroxaban 15 mg daily.  I will check a CBC and BMP to ensure stable blood counts and renal function with long-term anticoagulation.  Hypertension: Blood pressure mildly elevated in the office today and somewhat labile at home (though typically upper normal to mildly elevated).  In the setting of orthostatic lightheadedness and advanced age, we have agreed to continue mild  permissive hypertension and defer changes to her current regimen of amlodipine and lisinopril.  I will check a BMP today to ensure stable renal function and electrolytes.  Dysphagia: Barium swallow this summer suspicious for esophageal stricture and superimposed dysmotility.  If regurgitation/vomiting increases in frequency severity, Paula Luna should reach out to GI to discuss moving forward with EGD/esophageal dilation.  Leg edema: This has been a chronic issue and appears stable.  I suspect venous insufficiency is contributing.  Paula Luna should continue with leg elevation when possible and prn furosemide use.    Dispo: Return to clinic in 6 months.  Signed, Paula Kendall, MD

## 2023-08-03 ENCOUNTER — Telehealth: Payer: Self-pay | Admitting: Internal Medicine

## 2023-08-03 ENCOUNTER — Other Ambulatory Visit: Payer: Self-pay

## 2023-08-03 DIAGNOSIS — Z79899 Other long term (current) drug therapy: Secondary | ICD-10-CM

## 2023-08-03 LAB — BASIC METABOLIC PANEL
BUN/Creatinine Ratio: 19 (ref 12–28)
BUN: 17 mg/dL (ref 10–36)
CO2: 24 mmol/L (ref 20–29)
Calcium: 9.9 mg/dL (ref 8.7–10.3)
Chloride: 106 mmol/L (ref 96–106)
Creatinine, Ser: 0.91 mg/dL (ref 0.57–1.00)
Glucose: 97 mg/dL (ref 70–99)
Potassium: 5.4 mmol/L — ABNORMAL HIGH (ref 3.5–5.2)
Sodium: 145 mmol/L — ABNORMAL HIGH (ref 134–144)
eGFR: 58 mL/min/{1.73_m2} — ABNORMAL LOW (ref >59)

## 2023-08-03 LAB — CBC
Hematocrit: 40.5 % (ref 34.0–46.6)
Hemoglobin: 12.6 g/dL (ref 11.1–15.9)
MCH: 24.7 pg — ABNORMAL LOW (ref 26.6–33.0)
MCHC: 31.1 g/dL — ABNORMAL LOW (ref 31.5–35.7)
MCV: 79 fL (ref 79–97)
Platelets: 193 10*3/uL (ref 150–450)
RBC: 5.1 x10E6/uL (ref 3.77–5.28)
RDW: 14.7 % (ref 11.7–15.4)
WBC: 6 10*3/uL (ref 3.4–10.8)

## 2023-08-03 NOTE — Telephone Encounter (Signed)
The patient's daughter has been notified of the result along with recommendations. Pt's daughter verbalized understanding. All questions (if any) were answered    Repeat lab order placed      Yvonne Kendall, MD 08/03/2023  7:24 AM EST     Please let Paula Luna know that her labs are fairly stable though her potassium is mildly elevated.  I recommend that she try to avoid consuming lots of potassium rich foods such as bananas, avocados, potatoes, spinach, and oranges/orange juice.  She should continue her current medications.  We should plan to repeat a basic metabolic panel in about 2 weeks to ensure that her potassium has not risen further.  She should continue her current medications

## 2023-08-03 NOTE — Telephone Encounter (Signed)
Patient was calling back about results

## 2023-08-04 ENCOUNTER — Telehealth: Payer: Self-pay | Admitting: Family Medicine

## 2023-08-04 ENCOUNTER — Other Ambulatory Visit: Payer: Self-pay | Admitting: Family Medicine

## 2023-08-04 DIAGNOSIS — E875 Hyperkalemia: Secondary | ICD-10-CM

## 2023-08-04 NOTE — Telephone Encounter (Signed)
Terri,    Is this okay with you?

## 2023-08-04 NOTE — Telephone Encounter (Signed)
I went ahead and placed a future order.  Can you help set her up with a lab appointment?  Thanks!

## 2023-08-04 NOTE — Telephone Encounter (Signed)
Patients cardiologist would like for her to get a BMP done.Patient would like to come to our office to have done since it is closer to her home.Is it okay for her to come here and have done?Dr End has already placed the order.

## 2023-08-05 ENCOUNTER — Other Ambulatory Visit: Payer: Self-pay | Admitting: Internal Medicine

## 2023-08-05 DIAGNOSIS — I4821 Permanent atrial fibrillation: Secondary | ICD-10-CM

## 2023-08-07 ENCOUNTER — Telehealth: Payer: Self-pay | Admitting: Family Medicine

## 2023-08-07 NOTE — Telephone Encounter (Signed)
Will await patient's daughter's response.

## 2023-08-07 NOTE — Telephone Encounter (Signed)
Patient's daughter contacted the office, states she is needing a form completed for patient called a change of status medical form, she would like to extend the skilled nursing services that her mom is getting. Patient is getting these services from [facility]. Agency is needing this form completed so they can re evaluate patient.

## 2023-08-07 NOTE — Telephone Encounter (Signed)
We do not have this type of form in the office.  She will need to provide or HH will need to send to [place].

## 2023-08-07 NOTE — Telephone Encounter (Signed)
Is daughter dropping off form for [place] to complete?

## 2023-08-07 NOTE — Telephone Encounter (Signed)
Prescription refill request for Xarelto received.  Indication:AFIB Last office visit:11/24 Weight:48.9  kg Age:89 Scr:0.91  11/24 CrCl:27.91  ml/min  Prescription refilled

## 2023-08-07 NOTE — Telephone Encounter (Signed)
Called patient's daughter [name], says she is going to reach out and see if she is able to get this form from [facility] agency. Will call back with a response. Do we have this form in the office?

## 2023-08-15 NOTE — Telephone Encounter (Signed)
Spoke with patient daughter [name] and she stated that she will try to get all this completed after the first of the year.

## 2023-08-23 ENCOUNTER — Other Ambulatory Visit: Payer: 59

## 2023-08-28 ENCOUNTER — Emergency Department
Admission: EM | Admit: 2023-08-28 | Discharge: 2023-08-28 | Disposition: A | Discharge status: Home / Self Care | Payer: 59 | Attending: Emergency Medicine | Admitting: Emergency Medicine

## 2023-08-28 ENCOUNTER — Encounter: Payer: Self-pay | Admitting: Emergency Medicine

## 2023-08-28 ENCOUNTER — Emergency Department: Payer: 59

## 2023-08-28 ENCOUNTER — Telehealth: Payer: Self-pay | Admitting: Family Medicine

## 2023-08-28 ENCOUNTER — Other Ambulatory Visit: Payer: Self-pay

## 2023-08-28 DIAGNOSIS — S6992XA Unspecified injury of left wrist, hand and finger(s), initial encounter: Secondary | ICD-10-CM | POA: Diagnosis present

## 2023-08-28 DIAGNOSIS — N189 Chronic kidney disease, unspecified: Secondary | ICD-10-CM | POA: Insufficient documentation

## 2023-08-28 DIAGNOSIS — T148XXA Other injury of unspecified body region, initial encounter: Secondary | ICD-10-CM

## 2023-08-28 DIAGNOSIS — S60222A Contusion of left hand, initial encounter: Secondary | ICD-10-CM | POA: Diagnosis not present

## 2023-08-28 DIAGNOSIS — I129 Hypertensive chronic kidney disease with stage 1 through stage 4 chronic kidney disease, or unspecified chronic kidney disease: Secondary | ICD-10-CM | POA: Insufficient documentation

## 2023-08-28 DIAGNOSIS — X58XXXA Exposure to other specified factors, initial encounter: Secondary | ICD-10-CM | POA: Diagnosis not present

## 2023-08-28 DIAGNOSIS — Z7901 Long term (current) use of anticoagulants: Secondary | ICD-10-CM | POA: Insufficient documentation

## 2023-08-28 NOTE — ED Provider Notes (Signed)
[facility] Provider Note    Event Date/Time   First MD Initiated Contact with Patient 08/28/23 1621     (approximate)   History   Hand Problem   HPI Paula Luna is a 89 y.o. female with history of A-fib, hypertension, CKD, osteoporosis presents emergency department with bruising around the left ring finger.  Patient's daughter states that he was called by the aide that sits with her who thought the hand was turning purple.  However the hand is not purple at this time.  She is just worried about the bruising around the finger.  The patient states that she was rubbing her hands when the incident happened.  Patient is on Eliquis for A-fib      Physical Exam   Triage Vital Signs: ED Triage Vitals  Encounter Vitals Group     BP 08/28/23 1422 (!) 144/81     Systolic BP Percentile --      Diastolic BP Percentile --      Pulse Rate 08/28/23 1422 77     Resp 08/28/23 1422 18     Temp 08/28/23 1422 98.2 F (36.8 C)     Temp Source 08/28/23 1422 Oral     SpO2 08/28/23 1422 100 %     Weight 08/28/23 1423 105 lb 13.1 oz (48 kg)     Height 08/28/23 1423 4\' 10"  (1.473 m)     Head Circumference --      Peak Flow --      Pain Score 08/28/23 1423 0     Pain Loc --      Pain Education --      Exclude from Growth Chart --     Most recent vital signs: Vitals:   08/28/23 1422  BP: (!) 144/81  Pulse: 77  Resp: 18  Temp: 98.2 F (36.8 C)  SpO2: 100%     General: Awake, no distress.   CV:  Good peripheral perfusion. regular rate and  rhythm Resp:  Normal effort. Abd:  No distention.   Other:  Left hand with bruising noted around the cuticles of the left ring finger, the remainder the hand is pink and warm, no necrosis noted   ED Results / Procedures / Treatments   Labs (all labs ordered are listed, but only abnormal results are displayed) Labs Reviewed - No data to display   EKG     RADIOLOGY X-ray of the left  hand    PROCEDURES:   Procedures   MEDICATIONS ORDERED IN ED: Medications - No data to display   IMPRESSION / MDM / ASSESSMENT AND PLAN / ED COURSE  I reviewed the triage vital signs and the nursing notes.                              Differential diagnosis includes, but is not limited to, bruising, cellulitis, fracture  Patient's presentation is most consistent with acute complicated illness / injury requiring diagnostic workup.   X-ray of the left hand   X-ray left hand independently reviewed interpreted by me as being negative for acute abnormality, pending radiology interpretation  I did explain these findings to the patient and her daughter.  Feel it is just from the Eliquis that she has some bruising.  Do not feel there is any necrosis.  They are to follow-up with her regular doctor as needed.  Return if worsening.  Discharged stable condition.  FINAL CLINICAL IMPRESSION(S) / ED DIAGNOSES   Final diagnoses:  Bruising     Rx / DC Orders   ED Discharge Orders     None        Note:  This document was prepared using Dragon voice recognition software and may include unintentional dictation errors.    Faythe Ghee, PA-C 08/28/23 1713    Sharyn Creamer, MD 09/01/23 [phone]

## 2023-08-28 NOTE — ED Provider Triage Note (Signed)
Emergency Medicine Provider Triage Evaluation Note  Paula Luna , a 89 y.o. female  was evaluated in triage.  Pt complains of skin discoloration. Went to UC and was sent here. Concern about hypoxia, SpO2 normal here.  Review of Systems  Positive:  Negative:   Physical Exam  There were no vitals taken for this visit. Gen:   Awake, no distress   Resp:  Normal effort  MSK:   Moves extremities without difficulty  Other:  Possible bruising on left ring fingertip, capillary refill appropriate, radial pulse 2+ and regular  Medical Decision Making  Medically screening exam initiated at 2:20 PM.  Appropriate orders placed.  Paula Luna was informed that the remainder of the evaluation will be completed by another provider, this initial triage assessment does not replace that evaluation, and the importance of remaining in the ED until their evaluation is complete.     Cameron Ali, PA-C 08/28/23 1423

## 2023-08-28 NOTE — Telephone Encounter (Signed)
Okay--I will assess her in the morning if she isn't seen in the ER tonight

## 2023-08-28 NOTE — Telephone Encounter (Signed)
Pt's daughter, [name], called stating the pt's [facility] aid sent a picture to her of the pt's left set of fingers & the pt's fingers are turning black. [name] states when she left the pt this morning around 9:30am, the pt was fine, fingers were normal. [name] states it'll take about 15 mins before she'll get home to pt. Spoke to [place], Rena states [name] would need to call office back with more info, once she is around pt. [name] asked if our office could call the pt's aid, Percell Belt? [name] provided Ariana's number, [phone] & their house number, [phone]. Percell Belt is not on pt's DPR. Please advise. Call back # [phone]

## 2023-08-28 NOTE — Telephone Encounter (Signed)
I spoke with [facility] said ED is extremely busy; [name] is going to cafeteria to get pt food ands coffee. [name] said ED told her if pt left the ED could not be responsible.[name] said the lt fingers are not as dark now but one finger has a black ring around it. [name] said she does not know what to do.I spoke with Dr Patsy Lager and he said the ED is extremely busy and no available appts at [facility] or [facility] this afternoon and if [name] needs to leave ED with pt to schedule appt with a provider at [facility] on 08/29/23 with UC & ED precautions and [name] voiced understanding. [name] scheduled appt with Dr Alphonsus Sias on 08/29/23 at 9:45 and said if they decide to stay at ED [name] will cb and cancel appt with Dr Alphonsus Sias on 08/29/23. Sending FYI note to Dr Patsy Lager and Dr Alphonsus Sias.

## 2023-08-28 NOTE — Telephone Encounter (Signed)
I spoke with [name]; pt has no complaints; no CP,SOB,dizziness, or SOB. Pt has had no injury. All fingers on lt hand are black and when pt relaxes hand fingers appear darker. Rt hand is warm with no discoloration at this time.lt hand is warm but fingers are cold and black no available appt at [facility] or [facility]. Pt is being dressed now and [name] will take pt to [employer]. UC& ED precautions given and [name] voiced understanding. Sending note to Dr Patsy Lager as Lorain Childes./.

## 2023-08-28 NOTE — Telephone Encounter (Signed)
Pt's daughter, [name], called stting the took the pt to the UC & was told they couldn't take pt's vitals, visit a ED. [name] states they have been in the ED for about 2 hours now, others have been there for 5+hrs. [name] asked if they should leave the ED due the long wait? [name] mentioned the pt hasn't ate lunch or anything & is worried about the long wait for the pt to be seen. Transferred pt to [facility] Call back # [phone]

## 2023-08-28 NOTE — Telephone Encounter (Signed)
I think that is reasonable.

## 2023-08-28 NOTE — ED Triage Notes (Signed)
Patient to ED via POV for discoloration to fingers in left hand. Aid at home noticed it this AM. Small area that darker that other near nail on left ring finger. Good cap refill in all fingers.

## 2023-08-29 ENCOUNTER — Ambulatory Visit: Payer: 59 | Admitting: Internal Medicine

## 2023-08-30 ENCOUNTER — Other Ambulatory Visit (INDEPENDENT_AMBULATORY_CARE_PROVIDER_SITE_OTHER): Payer: 59

## 2023-08-30 DIAGNOSIS — E875 Hyperkalemia: Secondary | ICD-10-CM

## 2023-08-30 LAB — BASIC METABOLIC PANEL
BUN: 19 mg/dL (ref 6–23)
CO2: 28 meq/L (ref 19–32)
Calcium: 9.4 mg/dL (ref 8.4–10.5)
Chloride: 105 meq/L (ref 96–112)
Creatinine, Ser: 0.91 mg/dL (ref 0.40–1.20)
GFR: 53.11 mL/min — ABNORMAL LOW (ref >60.00)
Glucose, Bld: 88 mg/dL (ref 70–99)
Potassium: 4.2 meq/L (ref 3.5–5.1)
Sodium: 140 meq/L (ref 135–145)

## 2023-09-08 ENCOUNTER — Telehealth: Payer: Self-pay | Admitting: Internal Medicine

## 2023-09-08 NOTE — Telephone Encounter (Signed)
Patient's daughter was returning phone call

## 2023-09-08 NOTE — Telephone Encounter (Signed)
Spoke to pt's daughter and provided her with lab results along with MD's recommendations.  The daughter wanted to inform Dr. Okey Dupre that last week she received a call from her mother's caregiver reporting discoloration in the patient's left hand. She stated that the back of the patient's hand was black and the palm was gray. The daughter mentioned having a very frustrating experience, as she took her mother to two locations, including urgent care, but was turned away. She stated that the patient was finally seen in the ER and was informed that the discoloration could have been caused by Xarelto. The daughter also noted that the patient does not remember hitting her hand, but the symptoms have since improved.  Will forward to MD for recommendations

## 2023-09-19 ENCOUNTER — Other Ambulatory Visit: Payer: Self-pay | Admitting: Family Medicine

## 2023-09-20 ENCOUNTER — Other Ambulatory Visit: Payer: Self-pay | Admitting: Family Medicine

## 2023-10-25 ENCOUNTER — Telehealth: Payer: Self-pay | Admitting: Family Medicine

## 2023-10-25 NOTE — Telephone Encounter (Signed)
Spoke with [name] and advised we ordered pull ups and gloves for Ms. Ferrara from [facility].  She states she is looking for who we got the barrier cream Selan Plus Zinc Oxide Cream 16 oz jar from.   I advised I am do not know where the cream came from.  I have never ordered a barrier cream for patient.  The only thing I can figure is we received an order at one point for [facility] for incontinence supplies and maybe they provided the cream.  I told her I would be happy to send in a Rx for the Zinc Oxide if she would like but [name] wants to do more research to try and figure out where this came from.  Nothing further is needed at this time.

## 2023-10-25 NOTE — Telephone Encounter (Signed)
Patient daughter is needing a call back regarding medical supplies she is needing to know where the order for the medical supplies where sent in this past she is needing more supplies order but she need's to know where the cream and gloves where ordered from

## 2023-11-02 ENCOUNTER — Other Ambulatory Visit: Payer: Self-pay | Admitting: Family Medicine

## 2023-11-02 ENCOUNTER — Telehealth: Payer: Self-pay | Admitting: *Deleted

## 2023-11-02 NOTE — Telephone Encounter (Signed)
 Copied from CRM [id number]. Topic: Clinical - Medication Question >> Nov 02, 2023  1:20 PM Alcus Dad wrote: Reason for CRM: Daughter of patient called in stating that patient needs a medication that I do not see on her list. (Selan PLUS) cream for her open wounds. She stated that she has some a few months ago but didn't know who prescribed it.

## 2023-11-02 NOTE — Telephone Encounter (Signed)
 Copied from CRM [id number]. Topic: Clinical - Medication Refill >> Nov 02, 2023  1:17 PM Alcus Dad wrote: Most Recent Primary Care Visit:  Provider: [facility]  Department: [facility]  Visit Type: LAB  Date: 08/30/2023  Medication: Selan PLUS  Has the patient contacted their pharmacy? Yes (Agent: If no, request that the patient contact the pharmacy for the refill. If patient does not wish to contact the pharmacy document the reason why and proceed with request.) (Agent: If yes, when and what did the pharmacy advise?)  Is this the correct pharmacy for this prescription? Yes If no, delete pharmacy and type the correct one.  This is the patient's preferred pharmacy:  [facility] [address] [place] - [address] [place] [address] Phone: [phone] Fax: [phone]  [facility] [phone] - [address], [place] - [address] [place] [address] Phone: [phone] Fax: [phone]   Has the prescription been filled recently? No  Is the patient out of the medication? Yes  Has the patient been seen for an appointment in the last year OR does the patient have an upcoming appointment? Yes  Can we respond through MyChart? No  Agent: Please be advised that Rx refills may take up to 3 business days. We ask that you follow-up with your pharmacy.

## 2023-11-03 NOTE — Telephone Encounter (Signed)
 [name] notified as instructed by telephone.  I advised her to take the tube to the pharmacy with her and they can help her find it.

## 2023-11-15 ENCOUNTER — Ambulatory Visit (INDEPENDENT_AMBULATORY_CARE_PROVIDER_SITE_OTHER): Payer: 59 | Admitting: Podiatry

## 2023-11-15 ENCOUNTER — Encounter: Payer: Self-pay | Admitting: Podiatry

## 2023-11-15 ENCOUNTER — Ambulatory Visit: Payer: 59 | Admitting: Podiatry

## 2023-11-15 DIAGNOSIS — M79676 Pain in unspecified toe(s): Secondary | ICD-10-CM | POA: Diagnosis not present

## 2023-11-15 DIAGNOSIS — D689 Coagulation defect, unspecified: Secondary | ICD-10-CM

## 2023-11-15 DIAGNOSIS — B351 Tinea unguium: Secondary | ICD-10-CM

## 2023-11-15 NOTE — Progress Notes (Signed)
 Presents today with her daughter for chief complaint of painful elongated toenails bilaterally.  She states that the left hallux hurts her.  Objective: Pulses are stable alert oriented 3 left hallux nail plate is thicker and longer than the rest appear to be pushed down aspect of the nailbed.  There is no erythema cellulitis drainage or odor but tender on palpation.  Otherwise all nails are long thick dystrophic with mycotic and incurvated.  Assessment: Pain limited onychomycosis 1 through 5 bilateral.  Plan: Debridement toenails 1 through 5.

## 2023-11-22 ENCOUNTER — Ambulatory Visit (INDEPENDENT_AMBULATORY_CARE_PROVIDER_SITE_OTHER): Payer: 59

## 2023-11-22 VITALS — Ht <= 58 in | Wt 105.0 lb

## 2023-11-22 DIAGNOSIS — Z0000 Encounter for general adult medical examination without abnormal findings: Secondary | ICD-10-CM

## 2023-11-22 NOTE — Progress Notes (Signed)
 Subjective:   Paula Luna is a 89 y.o. who presents for a Medicare Wellness preventive visit.  Visit Complete: Virtual I connected with  Paula Luna on 11/22/23 by a audio enabled telemedicine application and verified that I am speaking with the correct person using two identifiers.  Patient Location: Home  Provider Location: Office/Clinic  I discussed the limitations of evaluation and management by telemedicine. The patient expressed understanding and agreed to proceed.  Vital Signs: Because this visit was a virtual/telehealth visit, some criteria may be missing or patient reported. Any vitals not documented were not able to be obtained and vitals that have been documented are patient reported.  VideoDeclined- This patient declined Librarian, academic. Therefore the visit was completed with audio only.  AWV Questionnaire: No: Patient Medicare AWV questionnaire was not completed prior to this visit.  Cardiac Risk Factors include: advanced age (>58men, >109 women);hypertension;sedentary lifestyle     Objective:    Today's Vitals   11/22/23 1345  Weight: 105 lb (47.6 kg)  Height: 4\' 10"  (1.473 m)   Body mass index is 21.95 kg/m.     11/22/2023    2:13 PM 08/28/2023    2:23 PM 11/16/2022    2:45 PM 04/02/2021    1:22 PM 10/02/2020    6:11 AM 10/01/2020    9:14 AM 04/30/2019    4:20 PM  Advanced Directives  Does Patient Have a Medical Advance Directive? Yes Yes Yes No No No Yes  Type of Estate agent of [place];Living will Healthcare Power of [place];Living will Healthcare Power of [place];Living will    Living will;Healthcare Power of Attorney  Does patient want to make changes to medical advance directive?   No - Patient declined      Copy of Healthcare Power of Attorney in Chart? Yes - validated most recent copy scanned in chart (See row information)  Yes - validated most recent copy scanned in chart (See row  information)      Would patient like information on creating a medical advance directive?    No - Patient declined No - Patient declined No - Patient declined     Current Medications (verified) Outpatient Encounter Medications as of 11/22/2023  Medication Sig   amLODipine (NORVASC) 5 MG tablet Take 1 tablet by mouth once daily   Cholecalciferol (VITAMIN D) 50 MCG (2000 UT) CAPS Take 1 capsule by mouth daily.   docusate sodium (COLACE) 100 MG capsule Take 100 mg by mouth daily.   furosemide (LASIX) 20 MG tablet TAKE 1 TABLET BY MOUTH THREE TIMES A WEEK (Patient taking differently: prn)   lisinopril (ZESTRIL) 40 MG tablet Take 1 tablet by mouth once daily   Multiple Vitamins-Minerals (ICAPS PO) Take 1 capsule by mouth daily.   pantoprazole (PROTONIX) 40 MG tablet Take 1 tablet by mouth once daily   Rivaroxaban (XARELTO) 15 MG TABS tablet TAKE 1 TABLET BY MOUTH ONCE DAILY WITH SUPPER   No facility-administered encounter medications on file as of 11/22/2023.    Allergies (verified) Latex, Tetanus toxoid, Tramadol, and Alendronate sodium   History: Past Medical History:  Diagnosis Date   CKD (chronic kidney disease) stage 3, GFR 30-59 ml/min (HCC) 10/04/2021   Hypertension 12/25/2013   Ischemic bowel disease (HCC)    a. 03/2017 abd pain-->internal herina and ischemic jejunum s/p ex lap and small bowel resection.   Macular degeneration of left eye 01/06/2015   Osteoporosis    Permanent atrial fibrillation (HCC)  a. First noted 03/2017;  b. 07/2017 Echo: EF 60-65%, no rwma, mild MR, mildly to mod dil LA; c. 07/2017 48hr Holter: persistent Afib, avg HR of 66 (range 39-140 bpm);  d. CHA2DS2VASc = 4-->Xarelto initiated 07/2017.   Past Surgical History:  Procedure Laterality Date   CLOSED REDUCTION PROXIMAL HUMERUS FRACTURE Left 2017   LAPAROSCOPIC SMALL BOWEL RESECTION     LAPAROTOMY N/A 03/18/2017   Procedure: EXPLORATORY LAPAROTOMY, SMALL BOWEL RESECTION;  Surgeon: Leafy Ro, MD;   Location: [facility] ORS;  Service: General;  Laterality: N/A;   TOTAL ABDOMINAL HYSTERECTOMY     Family History  Problem Relation Age of Onset   Diabetes Mother    Cancer Father        jaw   Social History   Socioeconomic History   Marital status: Widowed    Spouse name: Not on file   Number of children: Not on file   Years of education: Not on file   Highest education level: Not on file  Occupational History   Occupation: Musician: [id number]    Comment: 3 days a week  Tobacco Use   Smoking status: Former   Smokeless tobacco: Never  Vaping Use   Vaping status: Never Used  Substance and Sexual Activity   Alcohol use: No   Drug use: No   Sexual activity: Not Currently  Other Topics Concern   Not on file  Social History Narrative   Widowed after 64 years   Working some at [employer] in [employer] with daughter   Social Drivers of Health   Financial Resource Strain: Low Risk  (11/22/2023)   Overall Financial Resource Strain (CARDIA)    Difficulty of Paying Living Expenses: Not hard at all  Food Insecurity: No Food Insecurity (11/22/2023)   Hunger Vital Sign    Worried About Running Out of Food in the Last Year: Never true    Ran Out of Food in the Last Year: Never true  Transportation Needs: No Transportation Needs (11/22/2023)   PRAPARE - Administrator, Civil Service (Medical): No    Lack of Transportation (Non-Medical): No  Physical Activity: Inactive (11/22/2023)   Exercise Vital Sign    Days of Exercise per Week: 0 days    Minutes of Exercise per Session: 0 min  Stress: No Stress Concern Present (11/22/2023)   [employer] of Occupational Health - Occupational Stress Questionnaire    Feeling of Stress : Not at all  Social Connections: Moderately Isolated (11/22/2023)   Social Connection and Isolation Panel [NHANES]    Frequency of Communication with Friends and Family: Twice a week    Frequency of Social Gatherings with  Friends and Family: Once a week    Attends Religious Services: 1 to 4 times per year    Active Member of [employer] or Organizations: No    Attends Banker Meetings: Never    Marital Status: Widowed    Tobacco Counseling Counseling given: Not Answered    Clinical Intake:  Pre-visit preparation completed: Yes  Pain : No/denies pain     BMI - recorded: 21.95 Nutritional Status: BMI of 19-24  Normal Nutritional Risks: None Diabetes: No  How often do you need to have someone help you when you read instructions, pamphlets, or other written materials from your doctor or pharmacy?: 1 - Never  Interpreter Needed?: No  Comments: lives with daughter Paula Luna Information entered by :: .Tawnia Schirm,LPN  Activities of Daily Living     11/22/2023    2:10 PM  In your present state of health, do you have any difficulty performing the following activities:  Hearing? 1  Vision? 1  Difficulty concentrating or making decisions? 1  Walking or climbing stairs? 1  Dressing or bathing? 1  Doing errands, shopping? 1  Preparing Food and eating ? Y  Using the Toilet? N  In the past six months, have you accidently leaked urine? Y  Do you have problems with loss of bowel control? N  Managing your Medications? Y  Managing your Finances? Y  Housekeeping or managing your Housekeeping? Y    Patient Care Team: Hannah Beat, MD as PCP - General End, Cristal Deer, MD as PCP - Cardiology (Cardiology) [place], Georganna Skeans, MD as Referring Physician (Ophthalmology)  Indicate any recent Medical Services you may have received from other than [facility] providers in the past year (date may be approximate).     Assessment:   This is a routine wellness examination for [place].  Hearing/Vision screen Hearing Screening - Comments:: Pt says her hearing is not as good as use to be Vision Screening - Comments:: Pt says her vision is not good [place]-appts evey   Goals Addressed    None    Depression Screen     11/22/2023    2:02 PM 11/16/2022    2:44 PM 09/29/2021   12:13 PM 03/25/2020   11:34 AM 02/18/2019    8:57 PM 02/13/2018   12:55 PM 01/23/2017    9:03 AM  PHQ 2/9 Scores  PHQ - 2 Score 0 0 0 0 0 0 0  PHQ- 9 Score     0 0     Fall Risk     11/22/2023    1:53 PM 11/16/2022    2:46 PM 09/29/2021   12:12 PM 03/25/2020   11:34 AM 02/18/2019    8:57 PM  Fall Risk   Falls in the past year? 1 0 1 0 0  Number falls in past yr: 0 0 1    Injury with Fall? 0 0 1    Risk for fall due to : History of fall(s);Impaired balance/gait;Impaired mobility;Impaired vision No Fall Risks     Follow up Education provided;Falls prevention discussed Falls prevention discussed;Falls evaluation completed       MEDICARE RISK AT HOME:  Medicare Risk at Home Any stairs in or around the home?: Yes If so, are there any without handrails?: Yes Home free of loose throw rugs in walkways, pet beds, electrical cords, etc?: Yes Adequate lighting in your home to reduce risk of falls?: Yes Life alert?: No Use of a cane, walker or w/c?: Yes Grab bars in the bathroom?: No Shower chair or bench in shower?: Yes Elevated toilet seat or a handicapped toilet?: Yes  TIMED UP AND GO:  Was the test performed?  No  Cognitive Function: 6CIT completed    02/18/2019    9:00 PM 02/13/2018   12:55 PM 01/23/2017    9:03 AM  MMSE - Mini Mental State Exam  Orientation to time 5 5 5   Orientation to Place 5 5 5   Registration 3 3 3   Attention/ Calculation 0 0 0  Recall 3 3 3   Language- name 2 objects 0 0 0  Language- repeat 1 1 1   Language- follow 3 step command 0 3 3  Language- read & follow direction 0 0 0  Write a sentence 0 0 0  Copy design 0 0 0  Total score 17 20 20         11/22/2023    2:11 PM 11/16/2022    2:49 PM  6CIT Screen  What Year? 4 points 0 points  What month? 0 points 0 points  What time? 0 points 0 points  Count back from 20 2 points 0 points  Months in reverse 2 points 0  points  Repeat phrase 0 points 6 points  Total Score 8 points 6 points    Immunizations Immunization History  Administered Date(s) Administered   Fluad Quad(high Dose 65+) 06/15/2020, 06/18/2021, 06/15/2022   Fluad Trivalent(High Dose 65+) 07/14/2023   Influenza Split 07/12/2012, 06/28/2013   Influenza Whole 08/20/2007, 05/10/2010   Influenza, High Dose Seasonal PF 08/14/2014, 07/29/2015, 05/22/2016, 06/10/2017, 06/20/2018   Influenza,inj,Quad PF,6+ Mos 05/07/2019   PFIZER(Purple Top)SARS-COV-2 Vaccination 11/29/2019, 12/20/2019   Pneumococcal Conjugate-13 12/25/2013   Pneumococcal Polysaccharide-23 05/10/2010   Zoster Recombinant(Shingrix) 03/06/2018   Zoster, Live 10/15/2012    Screening Tests Health Maintenance  Topic Date Due   DTaP/Tdap/Td (1 - Tdap) Never done   Zoster Vaccines- Shingrix (2 of 2) 05/01/2018   COVID-19 Vaccine (3 - Pfizer risk series) 01/17/2020   Medicare Annual Wellness (AWV)  11/21/2024   Pneumonia Vaccine 42+ Years old  Completed   INFLUENZA VACCINE  Completed   DEXA SCAN  Completed   HPV VACCINES  Aged Out    Health Maintenance  Health Maintenance Due  Topic Date Due   DTaP/Tdap/Td (1 - Tdap) Never done   Zoster Vaccines- Shingrix (2 of 2) 05/01/2018   COVID-19 Vaccine (3 - Pfizer risk series) 01/17/2020   Health Maintenance Items Addressed: None needed  Additional Screening:  Vision Screening: Recommended annual ophthalmology exams for early detection of glaucoma and other disorders of the eye.  Dental Screening: Recommended annual dental exams for proper oral hygiene  Community Resource Referral / Chronic Care Management: CRR required this visit?  No   CCM required this visit?  Appt scheduled with PCP    Plan:     I have personally reviewed and noted the following in the patient's chart:   Medical and social history Use of alcohol, tobacco or illicit drugs  Current medications and supplements including opioid prescriptions.  Patient is not currently taking opioid prescriptions. Functional ability and status Nutritional status Physical activity Advanced directives List of other physicians Hospitalizations, surgeries, and ER visits in previous 12 months Vitals Screenings to include cognitive, depression, and falls Referrals and appointments  In addition, I have reviewed and discussed with patient certain preventive protocols, quality metrics, and best practice recommendations. A written personalized care plan for preventive services as well as general preventive health recommendations were provided to patient.    Sue Lush, LPN   1/61/0960   After Visit Summary: (MyChart) Due to this being a telephonic visit, the after visit summary with patients personalized plan was offered to patient via MyChart   Notes:  I spoke with pt and daughter Paula Luna who saying she has a concern about her tongue turning black. I made PCP appt for such (first available 11/29/23) w/PCP. They declined visit with another provider.

## 2023-11-22 NOTE — Patient Instructions (Signed)
 Paula Luna , Thank you for taking time to come for your Medicare Wellness Visit. I appreciate your ongoing commitment to your health goals. Please review the following plan we discussed and let me know if I can assist you in the future.   Referrals/Orders/Follow-Ups/Clinician Recommendations: none  This is a list of the screening recommended for you and due dates:  Health Maintenance  Topic Date Due   DTaP/Tdap/Td vaccine (1 - Tdap) Never done   Zoster (Shingles) Vaccine (2 of 2) 05/01/2018   COVID-19 Vaccine (3 - Pfizer risk series) 01/17/2020   Medicare Annual Wellness Visit  11/21/2024   Pneumonia Vaccine  Completed   Flu Shot  Completed   DEXA scan (bone density measurement)  Completed   HPV Vaccine  Aged Out    Advanced directives: (In Chart) A copy of your advanced directives are scanned into your chart should your provider ever need it.  Next Medicare Annual Wellness Visit scheduled for next year: Yes 11/22/24 @ 1:40pm televisit

## 2023-11-28 NOTE — Progress Notes (Unsigned)
   Paula Mangrum T. Trenda Corliss, MD, CAQ Sports Medicine [facility] [address] [place], [address]  Phone: [phone]  FAX: [phone]  Paula Luna - 89 y.o. female  MRN [id number]  Date of Birth: [birth year]  Date: 11/29/2023  PCP: Hannah Beat, MD  Referral: Hannah Beat, MD  No chief complaint on file.  Subjective:   Paula Luna is a 89 y.o. very pleasant female patient with There is no height or weight on file to calculate BMI. who presents with the following:  She has a well-known 89 year old patient and she presents today for evaluation of some tongue concerns.    Review of Systems is noted in the HPI, as appropriate  Objective:   There were no vitals taken for this visit.  GEN: No acute distress; alert,appropriate. PULM: Breathing comfortably in no respiratory distress PSYCH: Normally interactive.   Laboratory and Imaging Data:  Assessment and Plan:   ***

## 2023-11-29 ENCOUNTER — Encounter: Payer: Self-pay | Admitting: Family Medicine

## 2023-11-29 ENCOUNTER — Ambulatory Visit (INDEPENDENT_AMBULATORY_CARE_PROVIDER_SITE_OTHER): Admitting: Family Medicine

## 2023-11-29 VITALS — BP 120/60 | HR 61 | Temp 98.0°F | Ht <= 58 in | Wt 110.2 lb

## 2023-11-29 DIAGNOSIS — Q383 Other congenital malformations of tongue: Secondary | ICD-10-CM | POA: Diagnosis not present

## 2023-11-29 DIAGNOSIS — L918 Other hypertrophic disorders of the skin: Secondary | ICD-10-CM

## 2023-12-06 ENCOUNTER — Ambulatory Visit: Payer: 59 | Admitting: Podiatry

## 2023-12-16 ENCOUNTER — Other Ambulatory Visit: Payer: Self-pay | Admitting: Family Medicine

## 2023-12-18 ENCOUNTER — Telehealth: Payer: Self-pay

## 2023-12-18 NOTE — Telephone Encounter (Signed)
 Please schedule CPE with fasting labs prior with Dr. Patsy Lager.  She has already had her MWV.

## 2023-12-18 NOTE — Telephone Encounter (Signed)
 Paula Luna

## 2023-12-18 NOTE — Telephone Encounter (Signed)
 Spoke to pt's daughter, scheduled pt for 01/03/24

## 2023-12-18 NOTE — Telephone Encounter (Signed)
 Marland Kitchen

## 2023-12-18 NOTE — Telephone Encounter (Signed)
 I spoke with [name] pt stopped vomiting and diarrhea. [name] said pt does not need an appt  at this time. [name] will cb if needed for appt. Sending note to Dr Patsy Lager.

## 2024-01-02 NOTE — Progress Notes (Unsigned)
 Grier Vu T. Astou Lada, MD, CAQ Sports Medicine [facility] [address] [place], [address]  Phone: [phone]  FAX: [phone]  Paula Luna - 89 y.o. female  MRN [id number]  Date of Birth: 05-05-1925  Date: 01/03/2024  PCP: Scherrie Curt, MD  Referral: Scherrie Curt, MD  No chief complaint on file.  Patient Care Team: Scherrie Curt, MD as PCP - General End, Veryl Gottron, MD as PCP - Cardiology (Cardiology) Tsamis, Ollie Bhat, MD as Referring Physician (Ophthalmology) Subjective:   Paula Luna is a 89 y.o. pleasant patient who presents with the following:  Health Maintenance Summary Reviewed and updated, unless pt declines services.  Tobacco History Reviewed. Non-smoker Alcohol: No concerns, no excessive use Exercise Habits: Some activity, rec at least 30 mins 5 times a week STD concerns: none Drug Use: None Lumps or breast concerns: no  Primary issues with her have been her continued worsening side of macular degeneration, and she also has atrial fibrillation.    Health Maintenance  Topic Date Due   DTaP/Tdap/Td (1 - Tdap) Never done   Zoster Vaccines- Shingrix (2 of 2) 05/01/2018   COVID-19 Vaccine (3 - Pfizer risk series) 01/17/2020   INFLUENZA VACCINE  04/12/2024   Medicare Annual Wellness (AWV)  11/21/2024   Pneumonia Vaccine 57+ Years old  Completed   DEXA SCAN  Completed   HPV VACCINES  Aged Out   Meningococcal B Vaccine  Aged Out    Immunization History  Administered Date(s) Administered   Fluad Quad(high Dose 65+) 06/15/2020, 06/18/2021, 06/15/2022   Fluad Trivalent(High Dose 65+) 07/14/2023   Influenza Split 07/12/2012, 06/28/2013   Influenza Whole 08/20/2007, 05/10/2010   Influenza, High Dose Seasonal PF 08/14/2014, 07/29/2015, 05/22/2016, 06/10/2017, 06/20/2018   Influenza,inj,Quad PF,6+ Mos 05/07/2019   PFIZER(Purple Top)SARS-COV-2 Vaccination 11/29/2019, 12/20/2019    Pneumococcal Conjugate-13 12/25/2013   Pneumococcal Polysaccharide-23 05/10/2010   Zoster Recombinant(Shingrix) 03/06/2018   Zoster, Live 10/15/2012   Patient Active Problem List   Diagnosis Date Noted   Permanent atrial fibrillation (HCC) 04/26/2017    Priority: High   CKD (chronic kidney disease) stage 3, GFR 30-59 ml/min (HCC) 10/04/2021    Priority: Medium    Essential hypertension 12/25/2013    Priority: Medium    Esophageal dysphagia 08/02/2023   Leg edema 08/02/2023   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 03/2017 abd pain-->internal herina and ischemic jejunum s/p ex lap and small bowel resection.   Macular degeneration of left eye 01/06/2015   Osteoporosis    Permanent atrial fibrillation (HCC)    a. First noted 03/2017;  b. 07/2017 Echo: EF 60-65%, no rwma, mild MR, mildly to mod dil LA; c. 07/2017 48hr Holter: persistent Afib, avg HR of 66 (range 39-140 bpm);  d. CHA2DS2VASc = 4-->Xarelto  initiated 07/2017.    Past Surgical History:  Procedure Laterality Date   CLOSED REDUCTION PROXIMAL HUMERUS FRACTURE Left 2017   LAPAROSCOPIC SMALL BOWEL RESECTION     LAPAROTOMY N/A 03/18/2017   Procedure: EXPLORATORY LAPAROTOMY, SMALL BOWEL RESECTION;  Surgeon: Alben Alma, MD;  Location: [facility] ORS;  Service:  General;  Laterality: N/A;   TOTAL ABDOMINAL HYSTERECTOMY      Family History  Problem Relation Age of Onset   Diabetes Mother    Cancer Father        jaw    Social History   Social History Narrative   Widowed after 64 years   Working some at [employer] in [employer] with daughter    Past Medical History, Surgical History, Social History, Family History, Problem List, Medications, and Allergies have been reviewed and updated if relevant.  Review of Systems: Pertinent positives are listed above.  Otherwise, a full 14 point review of systems has been done in full and it is negative except where it is noted positive.  Objective:   There were no vitals taken for this visit. Ideal Body Weight:   No results found.    11/22/2023    2:02 PM 11/16/2022    2:44 PM 09/29/2021   12:13 PM 03/25/2020   11:34 AM 02/18/2019    8:57 PM  Depression screen PHQ 2/9  Decreased Interest 0 0 0 0 0  Down, Depressed, Hopeless 0 0 0 0 0  PHQ - 2 Score 0 0 0 0 0  Altered sleeping     0  Tired, decreased energy     0  Change in appetite     0  Feeling bad or failure about yourself      0  Trouble concentrating     0  Moving slowly or fidgety/restless     0  Suicidal thoughts     0  PHQ-9 Score     0  Difficult doing work/chores     Not difficult at all     GEN: well developed, well nourished, no acute distress Eyes: conjunctiva and lids normal, PERRLA, EOMI ENT: TM clear, nares clear, oral exam WNL Neck: supple, no lymphadenopathy, no thyromegaly, no JVD Pulm: clear to auscultation and percussion, respiratory effort normal CV: regular rate and rhythm, S1-S2, no murmur, rub or gallop, no bruits Chest: no scars, masses, no lumps BREAST: breast exam declined GI: soft, non-tender; no hepatosplenomegaly, masses; active bowel sounds all quadrants GU: GU exam declined Lymph: no cervical, axillary or inguinal adenopathy MSK: gait normal, muscle tone and strength WNL, no joint swelling, effusions, discoloration, crepitus  SKIN: clear, good turgor, color WNL, no rashes, lesions, or ulcerations Neuro: normal mental status, normal strength, sensation, and motion Psych: alert; oriented to person, place and time, normally interactive and not anxious or depressed in  appearance.   All labs reviewed with patient. Results for orders placed or performed in visit on 08/30/23  Basic metabolic panel   Collection Time: 08/30/23  9:53 AM  Result Value Ref Range   Sodium 140 135 - 145 mEq/L   Potassium 4.2 3.5 - 5.1 mEq/L   Chloride 105 96 - 112 mEq/L   CO2 28 19 - 32 mEq/L   Glucose, Bld 88 70 - 99 mg/dL   BUN 19 6 - 23 mg/dL   Creatinine, Ser 1.61 0.40 - 1.20 mg/dL   GFR 09.60 (L) >45.40 mL/min   Calcium 9.4 8.4 - 10.5 mg/dL   No results found.  Assessment and Plan:     ICD-10-CM   1. Healthcare maintenance  Z00.00       Health Maintenance Exam: The patient's preventative maintenance and recommended screening tests for an annual wellness exam were reviewed in full today. Brought up to date unless  services declined.  Counselled on the importance of diet, exercise, and its role in overall health and mortality. The patient's FH and SH was reviewed, including their home life, tobacco status, and drug and alcohol status.  Follow-up in 1 year for physical exam or additional follow-up below.  Disposition: No follow-ups on file.  Future Appointments  Date Time Provider Department Center  01/03/2024  2:00 PM Scherrie Curt, MD [facility] [place]  01/17/2024 11:20 AM Florette Hurry, NP [facility] None  11/22/2024  1:40 PM [facility] ANNUAL WELLNESS VISIT 1 [facility]    No orders of the defined types were placed in this encounter.  There are no discontinued medications. No orders of the defined types were placed in this encounter.   Signed,  Ranny Bye. Koltan Portocarrero, MD   Allergies as of 01/03/2024       Reactions   Latex Rash   Tetanus Toxoid Anaphylaxis   REACTION: anaphylaxis   Tramadol  Other (See Comments)   Caused severe constipation---bowel obstruction   Alendronate Sodium    REACTION: passed out        Medication List        Accurate as of January 02, 2024  3:04 PM. If you have any questions, ask your nurse or doctor.           amLODipine  5 MG tablet Commonly known as: NORVASC  Take 1 tablet by mouth once daily   docusate sodium  100 MG capsule Commonly known as: COLACE Take 100 mg by mouth daily.   furosemide  20 MG tablet Commonly known as: LASIX  Take 20 mg by mouth daily as needed for edema.   ICAPS PO Take 1 capsule by mouth daily.   lisinopril  40 MG tablet Commonly known as: ZESTRIL  Take 1 tablet by mouth once daily   pantoprazole  40 MG tablet Commonly known as: PROTONIX  Take 1 tablet by mouth once daily   Vitamin D  50 MCG (2000 UT) Caps Take 1 capsule by mouth daily.   Xarelto  15 MG Tabs tablet Generic drug: Rivaroxaban  TAKE 1 TABLET BY MOUTH ONCE DAILY WITH SUPPER

## 2024-01-03 ENCOUNTER — Encounter: Payer: Self-pay | Admitting: Family Medicine

## 2024-01-03 ENCOUNTER — Ambulatory Visit (INDEPENDENT_AMBULATORY_CARE_PROVIDER_SITE_OTHER): Admitting: Family Medicine

## 2024-01-03 VITALS — BP 90/60 | HR 71 | Temp 97.8°F | Ht <= 58 in | Wt 109.0 lb

## 2024-01-03 DIAGNOSIS — R739 Hyperglycemia, unspecified: Secondary | ICD-10-CM | POA: Diagnosis not present

## 2024-01-03 DIAGNOSIS — Z79899 Other long term (current) drug therapy: Secondary | ICD-10-CM

## 2024-01-03 DIAGNOSIS — E782 Mixed hyperlipidemia: Secondary | ICD-10-CM

## 2024-01-03 DIAGNOSIS — N1831 Chronic kidney disease, stage 3a: Secondary | ICD-10-CM

## 2024-01-03 DIAGNOSIS — Z0000 Encounter for general adult medical examination without abnormal findings: Secondary | ICD-10-CM | POA: Diagnosis not present

## 2024-01-04 ENCOUNTER — Encounter: Payer: Self-pay | Admitting: *Deleted

## 2024-01-04 LAB — CBC WITH DIFFERENTIAL/PLATELET
Basophils Absolute: 0 10*3/uL (ref 0.0–0.1)
Basophils Relative: 0.5 % (ref 0.0–3.0)
Eosinophils Absolute: 0.1 10*3/uL (ref 0.0–0.7)
Eosinophils Relative: 2.9 % (ref 0.0–5.0)
HCT: 40.7 % (ref 36.0–46.0)
Hemoglobin: 13.3 g/dL (ref 12.0–15.0)
Lymphocytes Relative: 40.8 % (ref 12.0–46.0)
Lymphs Abs: 2 10*3/uL (ref 0.7–4.0)
MCHC: 32.7 g/dL (ref 30.0–36.0)
MCV: 77.8 fl — ABNORMAL LOW (ref 78.0–100.0)
Monocytes Absolute: 0.6 10*3/uL (ref 0.1–1.0)
Monocytes Relative: 12.9 % — ABNORMAL HIGH (ref 3.0–12.0)
Neutro Abs: 2.1 10*3/uL (ref 1.4–7.7)
Neutrophils Relative %: 42.9 % — ABNORMAL LOW (ref 43.0–77.0)
Platelets: 196 10*3/uL (ref 150.0–400.0)
RBC: 5.23 Mil/uL — ABNORMAL HIGH (ref 3.87–5.11)
RDW: 16.8 % — ABNORMAL HIGH (ref 11.5–15.5)
WBC: 4.8 10*3/uL (ref 4.0–10.5)

## 2024-01-04 LAB — BASIC METABOLIC PANEL WITH GFR
BUN: 20 mg/dL (ref 6–23)
CO2: 29 meq/L (ref 19–32)
Calcium: 9.5 mg/dL (ref 8.4–10.5)
Chloride: 102 meq/L (ref 96–112)
Creatinine, Ser: 0.98 mg/dL (ref 0.40–1.20)
GFR: 48.47 mL/min — ABNORMAL LOW (ref >60.00)
Glucose, Bld: 85 mg/dL (ref 70–99)
Potassium: 4.3 meq/L (ref 3.5–5.1)
Sodium: 141 meq/L (ref 135–145)

## 2024-01-04 LAB — HEMOGLOBIN A1C: Hgb A1c MFr Bld: 5.8 % (ref 4.6–6.5)

## 2024-01-04 LAB — HEPATIC FUNCTION PANEL
ALT: 11 U/L (ref 0–35)
AST: 19 U/L (ref 0–37)
Albumin: 4.2 g/dL (ref 3.5–5.2)
Alkaline Phosphatase: 96 U/L (ref 39–117)
Bilirubin, Direct: 0.1 mg/dL (ref 0.0–0.3)
Total Bilirubin: 0.5 mg/dL (ref 0.2–1.2)
Total Protein: 7.2 g/dL (ref 6.0–8.3)

## 2024-01-04 LAB — TSH: TSH: 2.47 u[IU]/mL (ref 0.35–5.50)

## 2024-01-17 ENCOUNTER — Encounter: Payer: Self-pay | Admitting: Nurse Practitioner

## 2024-01-17 ENCOUNTER — Ambulatory Visit (INDEPENDENT_AMBULATORY_CARE_PROVIDER_SITE_OTHER)
Admission: RE | Admit: 2024-01-17 | Discharge: 2024-01-17 | Disposition: A | Discharge status: Home / Self Care | Source: Ambulatory Visit | Attending: General Practice | Admitting: General Practice

## 2024-01-17 ENCOUNTER — Ambulatory Visit: Attending: Nurse Practitioner | Admitting: Nurse Practitioner

## 2024-01-17 ENCOUNTER — Encounter: Payer: Self-pay | Admitting: General Practice

## 2024-01-17 ENCOUNTER — Telehealth: Payer: Self-pay

## 2024-01-17 ENCOUNTER — Ambulatory Visit: Admitting: General Practice

## 2024-01-17 VITALS — BP 132/72 | HR 69 | Resp 17 | Ht <= 58 in | Wt 108.4 lb

## 2024-01-17 VITALS — BP 120/82 | HR 61 | Temp 98.7°F | Ht <= 58 in | Wt 108.0 lb

## 2024-01-17 DIAGNOSIS — M19041 Primary osteoarthritis, right hand: Secondary | ICD-10-CM | POA: Diagnosis not present

## 2024-01-17 DIAGNOSIS — M545 Low back pain, unspecified: Secondary | ICD-10-CM

## 2024-01-17 DIAGNOSIS — R1319 Other dysphagia: Secondary | ICD-10-CM | POA: Diagnosis not present

## 2024-01-17 DIAGNOSIS — I4821 Permanent atrial fibrillation: Secondary | ICD-10-CM | POA: Diagnosis not present

## 2024-01-17 DIAGNOSIS — I10 Essential (primary) hypertension: Secondary | ICD-10-CM | POA: Diagnosis not present

## 2024-01-17 DIAGNOSIS — M79642 Pain in left hand: Secondary | ICD-10-CM | POA: Diagnosis not present

## 2024-01-17 DIAGNOSIS — M810 Age-related osteoporosis without current pathological fracture: Secondary | ICD-10-CM | POA: Diagnosis not present

## 2024-01-17 DIAGNOSIS — M546 Pain in thoracic spine: Secondary | ICD-10-CM

## 2024-01-17 DIAGNOSIS — W19XXXA Unspecified fall, initial encounter: Secondary | ICD-10-CM | POA: Insufficient documentation

## 2024-01-17 DIAGNOSIS — M85841 Other specified disorders of bone density and structure, right hand: Secondary | ICD-10-CM | POA: Diagnosis not present

## 2024-01-17 DIAGNOSIS — M79641 Pain in right hand: Secondary | ICD-10-CM | POA: Diagnosis not present

## 2024-01-17 DIAGNOSIS — N182 Chronic kidney disease, stage 2 (mild): Secondary | ICD-10-CM | POA: Diagnosis not present

## 2024-01-17 DIAGNOSIS — Z043 Encounter for examination and observation following other accident: Secondary | ICD-10-CM | POA: Diagnosis not present

## 2024-01-17 DIAGNOSIS — S6992XA Unspecified injury of left wrist, hand and finger(s), initial encounter: Secondary | ICD-10-CM | POA: Diagnosis not present

## 2024-01-17 NOTE — Telephone Encounter (Signed)
 I spoke with Paula Luna (DPR signed) On 01/16/24 pt fell backwards and landed on buttocks and pt tried to brace herself with rt hand during fall. Today pt has some soreness in rt hand and lower back hurts, pt has appt this morning with cardiology and [name] scheduled appt with B Kaur NP 01/17/24 at 3 pm. Pt is able to walk with walker as usual.[name] does not want to go to UC. UC & ED precautions given and Paula Luna voiced understanding.to Loreli Rogue NP.

## 2024-01-17 NOTE — Progress Notes (Signed)
 Office Visit    Patient Name: Paula Luna Date of Encounter: 01/17/2024  Primary Care Provider:  Scherrie Curt, MD Primary Cardiologist:  Sammy Crisp, MD  Chief Complaint    89 y.o. female with a history of permanent atrial fibrillation, hypertension (permissive in the setting of lightheadedness and syncope), stage III chronic kidney disease, osteoporosis, anemia, and ischemic bowel status post bowel resection, who presents for hypertension follow-up.  Past Medical History  Subjective   Past Medical History:  Diagnosis Date   CKD (chronic kidney disease) stage 3, GFR 30-59 ml/min (HCC) 10/04/2021   Hypertension 12/25/2013   Ischemic bowel disease (HCC)    a. 03/2017 abd pain-->internal herina and ischemic jejunum s/p ex lap and small bowel resection.   Macular degeneration of left eye 01/06/2015   Osteoporosis    Permanent atrial fibrillation (HCC)    a. First noted 03/2017;  b. 07/2017 Echo: EF 60-65%, no rwma, mild MR, mildly to mod dil LA; c. 07/2017 48hr Holter: persistent Afib, avg HR of 66 (range 39-140 bpm);  d. CHA2DS2VASc = 4-->Xarelto  initiated 07/2017.   Past Surgical History:  Procedure Laterality Date   CLOSED REDUCTION PROXIMAL HUMERUS FRACTURE Left 2017   LAPAROSCOPIC SMALL BOWEL RESECTION     LAPAROTOMY N/A 03/18/2017   Procedure: EXPLORATORY LAPAROTOMY, SMALL BOWEL RESECTION;  Surgeon: Alben Alma, MD;  Location: [facility] ORS;  Service: General;  Laterality: N/A;   TOTAL ABDOMINAL HYSTERECTOMY      Allergies  Allergies  Allergen Reactions   Latex Rash   Tetanus Toxoid Anaphylaxis    REACTION: anaphylaxis   Tramadol  Other (See Comments)    Caused severe constipation---bowel obstruction   Alendronate Sodium     REACTION: passed out      History of Present Illness      89 y.o. y/o female with above past medical history including permanent atrial fibrillation, hypertension (permissive in the setting of history of lightheadedness and syncope),  stage III chronic kidney disease, osteoporosis, anemia, and 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 Atrial fibrillation was diagnosed in July 2018, when she was admitted to [facility] with abdominal pain and found to have an inguinal hernia with ischemic jejunum for which, exploratory laparotomy and small bowel resection was performed. She was subsequently placed on oral anticoagulation (Xarelto ), and has been managed with a rate control strategy. In the summer 2022, she was hospitalized for small bowel obstruction complicated by syncope. Subsequent event monitoring showed atrial fibrillation with borderline slow ventricular response, without prolonged pauses. She developed epistaxis in the fall 2022, for which she saw ENT and underwent electrocautery.   Over the course of late 2024, Paula Luna complained of dysphagia with intermittent nausea and vomiting of copious amounts of mucoid emesis.  She was seen by GI with barium swallow showing patulous esophagus with moderate dysmotility and failure of barium tablet to pass the distal esophagus suspicious for stricture.  She was offered EGD but deferred.   Paula Luna was last seen in cardiology clinic in November 2020 for at which time she noted reduced degree of dysphagia and nausea/vomiting.  She continued to have intermittent orthostatic lightheadedness.  Since then, both sets of symptoms have continued.  Dysphagia episodes occur at least once a week, often with her a.m. meal.  She does not experience chest pain or dyspnea.  She has not noted any significant worsening in orthostasis over the past several months.  On the evening of May 6, after getting ready for bed,  she walked into her kitchen to put something in the garbage and after doing so, she stepped backwards, lost her balance, and fell, striking her low to mid back on the floor.  She notes that she maintained consciousness throughout the fall and was careful to ensure that her neck was  flexed and that her head did not hit the floor.  She has since had some pain across her left mid back, though her daughter says there has not been any bruising.  Patient is scheduled see her primary care provider this afternoon.  She denies palpitations, PND, orthopnea, syncope, edema, or early satiety. Objective  Home Medications    Current Outpatient Medications  Medication Sig Dispense Refill   amLODipine  (NORVASC ) 5 MG tablet Take 1 tablet by mouth once daily 90 tablet 1   Cholecalciferol  (VITAMIN D ) 50 MCG (2000 UT) CAPS Take 1 capsule by mouth daily.     docusate sodium  (COLACE) 100 MG capsule Take 100 mg by mouth daily.     furosemide  (LASIX ) 20 MG tablet Take 20 mg by mouth daily as needed for edema.     lisinopril  (ZESTRIL ) 40 MG tablet Take 1 tablet by mouth once daily 90 tablet 3   Multiple Vitamins-Minerals (ICAPS PO) Take 1 capsule by mouth daily.     pantoprazole  (PROTONIX ) 40 MG tablet Take 1 tablet by mouth once daily 90 tablet 0   Rivaroxaban  (XARELTO ) 15 MG TABS tablet TAKE 1 TABLET BY MOUTH ONCE DAILY WITH SUPPER 90 tablet 1   No current facility-administered medications for this visit.     Physical Exam    VS:  BP 132/72   Pulse 69   Resp 17   Ht 4\' 10"  (1.473 m)   Wt 108 lb 6.4 oz (49.2 kg)   SpO2 (!) 86% Comment: w/ Pulse Index Error  BMI 22.66 kg/m  , BMI Body mass index is 22.66 kg/m.     Vitals:   01/17/24 1141 01/17/24 1250  BP: (!) 183/64 132/72  Pulse: 69   Resp: 17   SpO2: (!) 86%       GEN: Well nourished, well developed, in no acute distress. HEENT: normal. Neck: Supple, no JVD, carotid bruits, or masses. Cardiac: Irregularly, irregular, no murmurs, rubs, or gallops. No clubbing, cyanosis, edema.  Radials 2+/PT 1+ and equal bilaterally.  Respiratory:  Respirations regular and unlabored, clear to auscultation bilaterally. GI: Soft, nontender, nondistended, BS + x 4. MS: no deformity or atrophy. Skin: warm and dry, no rash.  Evaluation of her  back shows no evidence of bruising or swelling.  She is mildly tender to palpation along the left mid back.  No obvious deformities. Neuro:  Strength and sensation are intact. Psych: Normal affect.  Accessory Clinical Findings    ECG personally reviewed by me today - EKG Interpretation Date/Time:  Wednesday Jan 17 2024 11:48:04 EDT Ventricular Rate:  69 PR Interval:    QRS Duration:  66 QT Interval:  402 QTC Calculation: 430 R Axis:   -14  Text Interpretation: Atrial fibrillation Septal infarct (cited on or before 02-Apr-2021) Confirmed by Laneta Pintos [phone]) on 01/17/2024 12:48:57 PM  - no acute changes.  Lab Results  Component Value Date   WBC 4.8 01/03/2024   HGB 13.3 01/03/2024   HCT 40.7 01/03/2024   MCV 77.8 (L) 01/03/2024   PLT 196.0 01/03/2024   Lab Results  Component Value Date   CREATININE 0.98 01/03/2024   BUN 20 01/03/2024   NA 141 01/03/2024  K 4.3 01/03/2024   CL 102 01/03/2024   CO2 29 01/03/2024   Lab Results  Component Value Date   ALT 11 01/03/2024   AST 19 01/03/2024   ALKPHOS 96 01/03/2024   BILITOT 0.5 01/03/2024   Lab Results  Component Value Date   CHOL 186 12/14/2022   HDL 85.80 12/14/2022   LDLCALC 87 12/14/2022   LDLDIRECT 101.2 05/20/2009   TRIG 66.0 12/14/2022   CHOLHDL 2 12/14/2022    Lab Results  Component Value Date   HGBA1C 5.8 01/03/2024   Lab Results  Component Value Date   TSH 2.47 01/03/2024       Assessment & Plan    1.  Primary hypertension/chronic orthostasis: Overall, symptoms of orthostasis have been stable.  Historically we have allowed for permissive hypertension.  Her daughter notes that her blood pressure is quite variable at home, sometimes in the 120s and other times in the 170s.  Rarely issue above 140.  Today, she was initially 183/64, though on follow-up, she was 132/72.  She remains on amlodipine  and lisinopril .  No changes today.  2.  Permanent atrial fibrillation: Rate controlled in the absence  of AV nodal blocking agent.  She is anticoagulated with renally dosed Xarelto -15 mg.  Stable lab work in April.  3.  Dysphagia: Ongoing since last year.  Relatively stable, occurring at least once per week, typically with the morning meal.  Previously evaluated by GI with recommendation for EGD and possibly esophageal dilation, though patient is reluctant due to concern related to sedation.  She is on a PPI.  4.  Stage II-III chronic kidney disease: Creatinine 0.98 in April.  She remains on ACE inhibitor therapy.  5.  Fall: Patient lost her balance last night resulting in a fall backwards where she struck her lower/mid back.  She maintained consciousness throughout and does not believe that the fall was associated with orthostasis or lightheadedness.  She has had some discomfort in her left lower/mid back.  There is no bruising.  She is mildly tender on examination.  She is scheduled for primary care follow-up this afternoon and expects imaging.  6.  Chronic venous stasis: Stable.  Uses Lasix  sparingly.  7.  Disposition: Follow-up in cardiology clinic in 6 months or sooner if necessary.  Laneta Pintos, NP 01/17/2024, 12:51 PM

## 2024-01-17 NOTE — Patient Instructions (Signed)
 Complete xray(s) prior to leaving today. I will notify you of your results once received.   Please rest, use ice and tylenol  as needed for pain.   I will be in touch once I get the results.   It was a pleasure meeting you!

## 2024-01-17 NOTE — Telephone Encounter (Signed)
 Copied from CRM [id number]. Topic: General - Other >> Jan 17, 2024  9:17 AM Dorisann Garre T wrote: Reason for CRM: patient had a fall last night and is needing to received a call back on what she should do her palms of her hands are hurting from the fall good call back numbers  [phone]   [phone]      [phone]

## 2024-01-17 NOTE — Progress Notes (Signed)
 Established Patient Office Visit  Subjective   Patient ID: Paula Luna, female    DOB: [birth year]  Age: 89 y.o. MRN: [id number]  Chief Complaint  Patient presents with   Back Pain    Patient had a fall last night. Lower back hurts and palm of hand from where patient tried to brace her fall.     Back Pain Pertinent negatives include no abdominal pain, chest pain, dysuria, fever or headaches.    Paula Luna is a 89 year old female, patient of Dr. Geralyn Knee, with past medical history of HTN, permament atrial fibrillation, ischemic bowel disease, ckd stage 3, presents today for an acute visit after a fall.   Her daughter, Rexene Catching, is also present today.   Fall: patient had a fall on 01/16/24, after getting ready for bed, she walked into her kitchen to put something in the garbage and after doing so, she stepped backwards, lost her balance, and fell, striking her low to mid back on the floor. She maintained consciousness throughout the fall. Her neck was flexed and her head did not hit the floor. She tried to brace herself with her right hand during the fall which has caused some soreness in right hand. She also has discomfort in her lower mid back. She describes her pain as sharp and shooting. Pain is worse with movement. Pain is not radiating. Denies any urinary or stool incontinence.   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 03/2017 abd pain-->internal herina and ischemic jejunum s/p ex lap and small bowel resection.   Macular degeneration of left eye 01/06/2015   Osteoporosis    Permanent atrial fibrillation (HCC)    a. First noted 03/2017;  b. 07/2017 Echo: EF 60-65%, no rwma, mild MR, mildly to mod dil LA; c. 07/2017 48hr Holter: persistent Afib, avg HR of 66 (range 39-140 bpm);  d. CHA2DS2VASc = 4-->Xarelto  initiated 07/2017.   Past Surgical History:  Procedure Laterality Date   CLOSED REDUCTION PROXIMAL HUMERUS FRACTURE Left 2017   LAPAROSCOPIC SMALL BOWEL RESECTION     LAPAROTOMY N/A 03/18/2017   Procedure: EXPLORATORY LAPAROTOMY, SMALL BOWEL RESECTION;  Surgeon: Alben Alma, MD;  Location: [facility] ORS;  Service: General;  Laterality: N/A;   TOTAL ABDOMINAL HYSTERECTOMY     Allergies  Allergen Reactions   Latex Rash   Tetanus Toxoid Anaphylaxis    REACTION: anaphylaxis   Tramadol  Other (See Comments)    Caused severe constipation---bowel obstruction   Alendronate Sodium     REACTION: passed out         11/22/2023    2:02 PM 11/16/2022    2:44 PM 09/29/2021   12:13 PM  Depression screen PHQ 2/9  Decreased Interest 0 0 0  Down, Depressed, Hopeless 0 0 0  PHQ - 2 Score 0 0 0  No data to display            Review of Systems  Constitutional:  Negative for chills and fever.  Respiratory:  Negative for shortness of breath.   Cardiovascular:  Negative for chest pain.  Gastrointestinal:  Negative for abdominal pain, constipation, diarrhea, heartburn, nausea and vomiting.  Genitourinary:  Negative for dysuria, frequency and urgency.  Musculoskeletal:  Positive for back pain.       Bilateral hand pain.  Neurological:  Negative for dizziness and headaches.  Endo/Heme/Allergies:  Negative for polydipsia.  Psychiatric/Behavioral:  Negative for depression and suicidal ideas. The  patient is not nervous/anxious.       Objective:     BP 120/82 (BP Location: Left Arm, Patient Position: Sitting, Cuff Size: Normal)   Pulse 61   Temp 98.7 F (37.1 C) (Oral)   Ht 4\' 10"  (1.473 m)   Wt 108 lb (49 kg)   SpO2 94%   BMI 22.57 kg/m  BP Readings from Last 3 Encounters:  01/17/24 120/82  01/17/24 132/72  01/03/24 90/60   Wt Readings from Last 3 Encounters:  01/17/24 108 lb (49 kg)  01/17/24 108 lb 6.4 oz (49.2 kg)  01/03/24 109 lb (49.4 kg)      Physical Exam Vitals and nursing note reviewed.  Constitutional:      Appearance: Normal appearance.  Cardiovascular:     Rate and Rhythm: Normal rate and regular rhythm.     Pulses: Normal pulses.     Heart sounds: Normal heart sounds.  Pulmonary:     Effort: Pulmonary effort is normal.     Breath sounds: Normal breath sounds.  Musculoskeletal:     Right hand: Tenderness and bony tenderness present. No swelling. Normal range of motion.     Left hand: Tenderness and bony tenderness present. No swelling. Normal range of motion.     Thoracic back: Bony tenderness present.     Lumbar back: Bony tenderness present.  Neurological:     Mental Status: She is alert and oriented to person, place, and time.  Psychiatric:        Mood and Affect: Mood normal.        Behavior: Behavior normal.        Thought Content: Thought content normal.        Judgment: Judgment normal.      No results found for any visits on 01/17/24.     The ASCVD Risk score (Arnett DK, et al., 2019) failed to calculate for the following reasons:   The 2019 ASCVD risk score is only valid for ages 51 to 54    Assessment & Plan:  Fall, initial encounter Assessment & Plan: Tenderness present in both hands on exam.  Tenderness present down thoracic and lumbar spine on palpation.   Reviewed cardiology note from this morning. Neuro exam stable.  Patient stable for outpatient treatment.  STAT X-rays pending.   Recommend ice, rest and  tylenol  as needed for pain.   Await results.  Orders: -     DG Hand Complete Left -     DG Hand Complete Right -     DG Lumbar Spine Complete -     DG Thoracic Spine 2 View    Return if symptoms worsen or fail to improve.    Jolanda Nation, NP

## 2024-01-17 NOTE — Telephone Encounter (Signed)
 Noted. I will be glad to see her.

## 2024-01-17 NOTE — Assessment & Plan Note (Addendum)
 Tenderness present in both hands on exam.  Tenderness present down thoracic and lumbar spine on palpation.   Reviewed cardiology note from this morning. Neuro exam stable.  Patient stable for outpatient treatment.  STAT X-rays pending.   Recommend ice, rest and tylenol  as needed for pain.   Await results.

## 2024-01-17 NOTE — Patient Instructions (Signed)
 Medication Instructions:  Your Physician recommend you continue on your current medication as directed.    *If you need a refill on your cardiac medications before your next appointment, please call your pharmacy*   Follow-Up: At [facility], you and your health needs are our priority.  As part of our continuing mission to provide you with exceptional heart care, our providers are all part of one team.  This team includes your primary Cardiologist (physician) and Advanced Practice Providers or APPs (Physician Assistants and Nurse Practitioners) who all work together to provide you with the care you need, when you need it.  Your next appointment:   6 month(s)  Provider:   You may see Sammy Crisp, MD or one of the following Advanced Practice Providers on your designated Care Team:   Laneta Pintos, NP Gildardo Labrador, PA-C Varney Gentleman, PA-C Cadence Dutch Neck, PA-C Ronald Cockayne, NP Morey Ar, NP    We recommend signing up for the patient portal called "MyChart".  Sign up information is provided on this After Visit Summary.  MyChart is used to connect with patients for Virtual Visits (Telemedicine).  Patients are able to view lab/test results, encounter notes, upcoming appointments, etc.  Non-urgent messages can be sent to your provider as well.   To learn more about what you can do with MyChart, go to [email or web address].

## 2024-01-18 ENCOUNTER — Telehealth: Payer: Self-pay | Admitting: Family Medicine

## 2024-01-18 NOTE — Telephone Encounter (Signed)
 Copied from CRM [id number]. Topic: Clinical - Lab/Test Results >> Jan 18, 2024  4:13 PM Clyde Darling P wrote: Reason for CRM: I advise PCP would have to review Xray but did advise per note there was no fracture nor dislocation. Pt daughter Lorelee Roger would like to be contacted at [phone] or [phone].

## 2024-01-23 NOTE — Telephone Encounter (Deleted)
 Copied from CRM [id number]. Topic: General - Other >> Jan 23, 2024  3:58 PM Howard Macho wrote: Reason for CRM: patient daughter called stating the patient had a fall last week and she saw NP Deborra Falter. Patient daughter states there is break in her skin on her butt cheek and they have been using Desitin for it. Patient daughter does not know if the her skin is bothering her or her bone. Patient daughter want to know if there is another type of cream to use  CB [phone]

## 2024-01-23 NOTE — Telephone Encounter (Signed)
 Copied from CRM [id number]. Topic: General - Other >> Jan 23, 2024  3:58 PM Howard Macho wrote: Reason for CRM: patient daughter called stating the patient had a fall last week and she saw NP Deborra Falter. Patient daughter states there is break in her skin on her butt cheek and they have been using Desitin for it. Patient daughter does not know if the her skin is bothering her or her bone. Patient daughter want to know if there is another type of cream to use  CB [phone]

## 2024-01-24 ENCOUNTER — Encounter: Payer: Self-pay | Admitting: *Deleted

## 2024-01-24 NOTE — Telephone Encounter (Signed)
 Please return call  Desitin is ok to use in that case.  If it gets worse, then I would use a hydrocolloid dressing, but I think desitin is ok acutely.

## 2024-01-24 NOTE — Telephone Encounter (Signed)
 [name] notified as instructed by telephone.  Patient states understanding.

## 2024-02-03 ENCOUNTER — Other Ambulatory Visit: Payer: Self-pay | Admitting: Internal Medicine

## 2024-02-03 DIAGNOSIS — I4821 Permanent atrial fibrillation: Secondary | ICD-10-CM

## 2024-02-06 NOTE — Telephone Encounter (Signed)
 Prescription refill request for Xarelto  received.  Indication:Afib  Last office visit: 01/17/24 Paula Luna)  Weight: 49kg Age: 89 Scr: 0.98 (01/03/24)  CrCl: 25.27ml/min  Appropriate dose.Refill sent.

## 2024-02-07 ENCOUNTER — Telehealth: Payer: Self-pay | Admitting: *Deleted

## 2024-02-07 NOTE — Telephone Encounter (Signed)
 Spoke with Paula Luna.  She states they received a letter in the mail for [facility] stating Paula Luna needed to choice a PCP and the letter recommended someone in [place].  She states she call [facility] and told them Paula Luna wanted to stay with her current PCP and was advised that Dr. Jolena Nay Copland was no longer contracted with [facility].  Patient has [employer] Dual Complete Medicare.  She states she felt like the phone was a little strange and finally hung up on the gentlemen she was speaking with.  She is concerned it was some type of scam.  I let her know that I would send this message to our office manager to verify that Dr. Geralyn Knee and [facility] is still in network with [facility] Dual Complete Medicare.

## 2024-02-07 NOTE — Telephone Encounter (Signed)
 Copied from CRM [id number]. Topic: General - Other >> Feb 07, 2024  3:21 PM Albertha Alosa wrote: Reason for CRM: Daugther would like for Dr.Copland or his nurses to give her a call regarding a letter that she has recieved from insurance regarding changing patients pcp , would like for Dr.Copland or his nurses to give her a callback because she is confused and doesn't know what's going on regarding this letter   [phone]

## 2024-03-16 ENCOUNTER — Other Ambulatory Visit: Payer: Self-pay | Admitting: Family Medicine

## 2024-03-27 DIAGNOSIS — H3554 Dystrophies primarily involving the retinal pigment epithelium: Secondary | ICD-10-CM | POA: Diagnosis not present

## 2024-03-27 DIAGNOSIS — H353134 Nonexudative age-related macular degeneration, bilateral, advanced atrophic with subfoveal involvement: Secondary | ICD-10-CM | POA: Diagnosis not present

## 2024-03-27 DIAGNOSIS — H35363 Drusen (degenerative) of macula, bilateral: Secondary | ICD-10-CM | POA: Diagnosis not present

## 2024-03-27 DIAGNOSIS — H04123 Dry eye syndrome of bilateral lacrimal glands: Secondary | ICD-10-CM | POA: Diagnosis not present

## 2024-03-29 ENCOUNTER — Other Ambulatory Visit: Payer: Self-pay | Admitting: Internal Medicine

## 2024-04-20 ENCOUNTER — Other Ambulatory Visit: Payer: Self-pay

## 2024-04-20 ENCOUNTER — Emergency Department: Admission: EM | Admit: 2024-04-20 | Discharge: 2024-04-21 | Disposition: A | Discharge status: Home / Self Care

## 2024-04-20 ENCOUNTER — Emergency Department

## 2024-04-20 DIAGNOSIS — K224 Dyskinesia of esophagus: Secondary | ICD-10-CM | POA: Diagnosis not present

## 2024-04-20 DIAGNOSIS — R9389 Abnormal findings on diagnostic imaging of other specified body structures: Secondary | ICD-10-CM | POA: Diagnosis not present

## 2024-04-20 DIAGNOSIS — I700 Atherosclerosis of aorta: Secondary | ICD-10-CM | POA: Diagnosis not present

## 2024-04-20 DIAGNOSIS — Z7901 Long term (current) use of anticoagulants: Secondary | ICD-10-CM | POA: Insufficient documentation

## 2024-04-20 DIAGNOSIS — K219 Gastro-esophageal reflux disease without esophagitis: Secondary | ICD-10-CM | POA: Insufficient documentation

## 2024-04-20 DIAGNOSIS — N189 Chronic kidney disease, unspecified: Secondary | ICD-10-CM | POA: Diagnosis not present

## 2024-04-20 DIAGNOSIS — R1110 Vomiting, unspecified: Secondary | ICD-10-CM

## 2024-04-20 DIAGNOSIS — I129 Hypertensive chronic kidney disease with stage 1 through stage 4 chronic kidney disease, or unspecified chronic kidney disease: Secondary | ICD-10-CM | POA: Insufficient documentation

## 2024-04-20 DIAGNOSIS — R1310 Dysphagia, unspecified: Secondary | ICD-10-CM | POA: Diagnosis present

## 2024-04-20 DIAGNOSIS — M47812 Spondylosis without myelopathy or radiculopathy, cervical region: Secondary | ICD-10-CM | POA: Diagnosis not present

## 2024-04-20 DIAGNOSIS — I4891 Unspecified atrial fibrillation: Secondary | ICD-10-CM | POA: Insufficient documentation

## 2024-04-20 NOTE — ED Triage Notes (Signed)
 Pt presents to ER from home accompanied by daughter who reports they were eating dinner when pt started to burp. Per daughter pt started to drool and had a lot of saliva in her mouth, pt was able to get a piece of pasta, per daughter this has happened in the past, has seen specialist who informed them pt's esophagus needs to be stretched but pt does not want procedure. Pt in triage is calm, no drooling noted, able to talk in complete sentences.

## 2024-04-20 NOTE — ED Provider Notes (Signed)
 [facility] Provider Note    Event Date/Time   First MD Initiated Contact with Patient 04/20/24 2212     (approximate)   History   Aspiration  Pt presents to ER from home accompanied by daughter who reports they were eating dinner when pt started to burp. Per daughter pt started to drool and had a lot of saliva in her mouth, pt was able to get a piece of pasta, per daughter this has happened in the past, has seen specialist who informed them pt's esophagus needs to be stretched but pt does not want procedure. Pt in triage is calm, no drooling noted, able to talk in complete sentences.     HPI Paula Luna is a 89 y.o. female PMH esophageal dysphagia, A-fib on Xarelto , hypertension, CKD presents for evaluation of dysphagia/regurgitation -Patient not infrequently has episodes of regurgitation after attempting to eat.  She was eating dinner this evening, felt a gurgling sensation down in her lower throat/upper chest, and then regurgitated her food. -Daughter notes that patient has been told she has a narrow esophagus in the past and may benefit from a dilatation which patient declined. -Patient is asymptomatic, currently feels well.  Has tolerated with no water since events. -No chest pain or abdominal pain or shortness of breath.  No difficulty breathing since event, no cough.     Physical Exam   Triage Vital Signs: ED Triage Vitals  Encounter Vitals Group     BP 04/20/24 2043 (!) 129/47     Girls Systolic BP Percentile --      Girls Diastolic BP Percentile --      Boys Systolic BP Percentile --      Boys Diastolic BP Percentile --      Pulse Rate 04/20/24 2043 78     Resp 04/20/24 2043 16     Temp 04/20/24 2043 97.7 F (36.5 C)     Temp Source 04/20/24 2043 Oral     SpO2 04/20/24 2043 95 %     Weight 04/20/24 2048 105 lb (47.6 kg)     Height 04/20/24 2048 4' 9 (1.448 m)     Head Circumference --      Peak Flow --      Pain Score 04/20/24 2048  0     Pain Loc --      Pain Education --      Exclude from Growth Chart --     Most recent vital signs: Vitals:   04/20/24 2043  BP: (!) 129/47  Pulse: 78  Resp: 16  Temp: 97.7 F (36.5 C)  SpO2: 95%     General: Awake, no distress.  CV:  Good peripheral perfusion. RRR, RP 2+ Resp:  Normal effort. CTAB Abd:  No distention. Nontender to deep palpation throughout    ED Results / Procedures / Treatments   Labs (all labs ordered are listed, but only abnormal results are displayed) Labs Reviewed - No data to display   EKG  N/a   RADIOLOGY Radiology interpreted by myself and radiology report reviewed.  No evidence of aspiration nor radiopaque foreign body in neck.    PROCEDURES:  Critical Care performed: No  Procedures   MEDICATIONS ORDERED IN ED: Medications - No data to display   IMPRESSION / MDM / ASSESSMENT AND PLAN / ED COURSE  I reviewed the triage vital signs and the nursing notes.  DDX/MDM/AP: Differential diagnosis includes, but is not limited to, likely symptomatic esophageal stricture, considered but doubt esophageal foreign body at this time given patient is very well-appearing and asymptomatic, no drooling, appears very comfortable here with no globus or foreign body sensation.  No clinical concern for other etiology including intra-abdominal pathology, cardiac etiology at this time.  Plan: - Screening x-rays of chest and soft tissue neck performed at triage, unremarkable - Will p.o. trial here, if fails can consider Zofran  and glucagon and if that fails in addition can consider GI consultation  Patient's presentation is most consistent with exacerbation of chronic illness.    ED course below.  Signed out to oncoming ED provider pending p.o. challenge.  If tolerates, stable for outpatient follow-up with her primary care provider and gastroenterologist.      FINAL CLINICAL IMPRESSION(S) / ED DIAGNOSES    Final diagnoses:  Regurgitation of food  Esophageal dysmotility     Rx / DC Orders   ED Discharge Orders     None        Note:  This document was prepared using Dragon voice recognition software and may include unintentional dictation errors.   [name] LABOR, MD 04/21/24 [name]

## 2024-04-21 NOTE — ED Notes (Signed)
 Pt tolerated water well. Pt is now eating apple sauce with daughter at bedside.

## 2024-04-21 NOTE — ED Notes (Signed)
 Pt repositioned in bed at this time. Pt provided with water, instruced to sip on water. Pt's daughter instructed to notify staff if patient starts choking or coughing with drinking water, informed patient's daughter will bring back apple sauce shortly if patient tolerates water without difficulty.

## 2024-04-21 NOTE — Discharge Instructions (Signed)
 Call your GI doctor for an appointment

## 2024-04-21 NOTE — ED Notes (Signed)
Patient tolerated applesauce.

## 2024-04-21 NOTE — ED Provider Notes (Signed)
 Tolerated fluids and applesauce.  Discharge.   Cyrena Mylar, MD 04/21/24 [phone]

## 2024-04-22 ENCOUNTER — Ambulatory Visit: Payer: Self-pay

## 2024-04-22 NOTE — Telephone Encounter (Signed)
 I think Arlean wants a call  It sounds like it is likely the esophagus again.  We have talked about seeing GI and possible EGD for stricture dilation.  Is that something they would do?  As long as she feels ok now, probably nothing urgent to do.

## 2024-04-22 NOTE — Telephone Encounter (Signed)
 FYI Only or Action Required?: FYI only for provider.  Patient was last seen in primary care on 01/17/2024 by Vincente Shivers, NP.  Called Nurse Triage reporting Vomiting.  Symptoms began several days ago.  Interventions attempted: Rest, hydration, or home remedies.  Symptoms are: completely resolved.  Triage Disposition: See PCP Within 2 Weeks  Patient/caregiver understands and will follow disposition?: Yes, will follow disposition  Patient was recently discharge from hospital. She has ongoing vomiting and her daughter would like pcp to know. Daughter states she is in a rush and won't know when she will be available to assist her mother. She would like for someone to contact after 3pm [phone]   Reason for Disposition  [1] Choking has occurred 3 or more times in the last year AND [2] the cause is not known  Answer Assessment - Initial Assessment Questions 1. SUBSTANCE: What did you (the patient) choke on?  (e.g., food)     Food 3 days ago, went to ED, was discharged home.  2. SIZE: If the object was solid, ask: How big was it?      Pasta and meatball, all was cut into very small pcs per daughter 3. ONSET: When did it begin? (e.g., minutes ago)      3 days ago, resolved 4. RESPIRATORY DISTRESS: Describe the patient's breathing., Are they able to talk?     denies  Protocols used: Choking - Inhaled Foreign Body-A-AH

## 2024-04-22 NOTE — Telephone Encounter (Signed)
 Paula Luna has an appointment with you on Wednesday 04/24/24 at 11:00 am.  Can discuss this with Arlean at that visit.

## 2024-04-23 NOTE — Progress Notes (Signed)
 Paula Delone T. Kennie Karapetian, MD, CAQ Sports Medicine [facility] [address] [place], [address]  Phone: [phone]  FAX: [phone]  Paula Luna - 89 y.o. female  MRN [id number]  Date of Birth: 21-Aug-1925  Date: 04/24/2024  PCP: Watt Mirza, MD  Referral: Watt Mirza, MD  Chief Complaint  Patient presents with   Follow-up    Vomiting-ER follow up from 04/20/24   Subjective:   Paula Luna is a 89 y.o. very pleasant female patient with Body mass index is 21.84 kg/m. who presents with the following:  She is a pleasant patient Known for many years, she presents with some ongoing follow-up of esophageal dysphagia and vomiting, she ended up in the emergency room a few days ago.  We have talked about this in the past, and while I have suggested EGD, patient wanted to hold off on this.  Discussed the use of AI scribe software for clinical note transcription with the patient, who gave verbal consent to proceed.  History of Present Illness Paula Luna is a 89 year old female with a history of swallowing difficulties who presents with episodes of prolonged vomiting and concerns about possible aspiration.  She experienced a significant episode of vomiting that began with coughing and gurgling sounds while at the dinner table. This episode involved continuous expulsion of saliva-like fluid for approximately 30 minutes, which was longer than any previous occurrences. Her daughter noted that this was the most severe episode to date, prompting her to seek emergency medical assistance.  During the episode, her daughter attempted to manage the situation by using paper towels to absorb the fluid and removing her dentures as requested. Despite these efforts, the vomiting persisted intermittently, leading to a call for emergency services. A paramedic arrived after a delay of 15-20 minutes.  She was taken to the hospital by her family,  where she underwent chest and neck X-rays. She was able to keep down water and applesauce during her hospital stay and was discharged later that night. Her daughter expressed concern about the delay in receiving medical attention and the potential for future episodes.  She has a history of swallowing difficulties, and her family has been making efforts to modify her diet by cutting food into smaller pieces and providing softer foods like soup and scrambled eggs. Despite these modifications, she occasionally enjoys a [place] sandwich, which her family ensures is prepared with small pieces of mal[place].  Her current medications include Xarelto , a blood thinner, which she takes at night.     Review of Systems is noted in the HPI, as appropriate  Objective:   Pulse 65   Temp 98.2 F (36.8 C) (Temporal)   Ht 4' 10 (1.473 m)   Wt 104 lb 8 oz (47.4 kg)   SpO2 100%   BMI 21.84 kg/m   GEN: No acute distress; alert,appropriate. CV: irreg, irreg, no m/g/r  PULM: Normal respiratory rate, no accessory muscle use. No wheezes, crackles or rhonchi  PSYCH: Normally interactive.   Physical Exam CHEST: Lungs clear to auscultation bilaterally.  Laboratory and Imaging Data:  Assessment and Plan:     ICD-10-CM   1. Esophageal dysphagia  R13.19 Ambulatory referral to Gastroenterology    2. Vomiting without nausea, unspecified vomiting type  R11.11 Ambulatory referral to Gastroenterology      Assessment and Plan Assessment & Plan Esophageal dysphagia with suspected esophageal stricture and recurrent vomiting Suspected esophageal stricture causing dysphagia and vomiting.  Discussed risks of aspiration pneumonia. She is hesitant about endoscopy due to anesthesia concerns but reassured about sedation. Temporary cessation of Xarelto  discussed if endoscopy is pursued. GI practice at [place] is in flux; alternative referral to [facility] may be necessary. - Discuss potential endoscopy with GI  specialist to evaluate for esophageal stricture. - Continue dietary modifications with small, easily swallowable food items.  Orders placed today for conditions managed today: Orders Placed This Encounter  Procedures   Ambulatory referral to Gastroenterology    Disposition: No follow-ups on file.  [employer] One speech-to-text software was used for transcription in this dictation.  Possible transcriptional errors can occur using Animal nutritionist.   Signed,  Jacques DASEN. Caidon Foti, MD   Outpatient Encounter Medications as of 04/24/2024  Medication Sig   amLODipine  (NORVASC ) 5 MG tablet Take 1 tablet by mouth once daily   Cholecalciferol  (VITAMIN D ) 50 MCG (2000 UT) CAPS Take 1 capsule by mouth daily.   docusate sodium  (COLACE) 100 MG capsule Take 100 mg by mouth daily.   furosemide  (LASIX ) 20 MG tablet Take 20 mg by mouth daily as needed for edema.   lisinopril  (ZESTRIL ) 40 MG tablet Take 1 tablet by mouth once daily   Multiple Vitamins-Minerals (ICAPS PO) Take 1 capsule by mouth daily.   pantoprazole  (PROTONIX ) 40 MG tablet Take 1 tablet by mouth once daily   Rivaroxaban  (XARELTO ) 15 MG TABS tablet TAKE 1 TABLET BY MOUTH ONCE DAILY WITH SUPPER   No facility-administered encounter medications on file as of 04/24/2024.

## 2024-04-24 ENCOUNTER — Encounter: Payer: Self-pay | Admitting: Family Medicine

## 2024-04-24 ENCOUNTER — Ambulatory Visit (INDEPENDENT_AMBULATORY_CARE_PROVIDER_SITE_OTHER): Admitting: Family Medicine

## 2024-04-24 VITALS — HR 65 | Temp 98.2°F | Ht <= 58 in | Wt 104.5 lb

## 2024-04-24 DIAGNOSIS — R1319 Other dysphagia: Secondary | ICD-10-CM | POA: Diagnosis not present

## 2024-04-24 DIAGNOSIS — R1111 Vomiting without nausea: Secondary | ICD-10-CM

## 2024-04-24 NOTE — Patient Instructions (Signed)
                        Contains text generated by Abridge.                                 Contains text generated by Abridge.

## 2024-05-14 ENCOUNTER — Telehealth: Payer: Self-pay | Admitting: Family Medicine

## 2024-05-14 NOTE — Telephone Encounter (Signed)
 Spoke with Jenkins and advised that referrals had sent them a letter about Paula Luna's GI referral and that is probably the message she got.  Phone number and address provided for [facility] for her to call and schedule.

## 2024-05-14 NOTE — Telephone Encounter (Signed)
 Copied from CRM [id number]. Topic: General - Other >> May 14, 2024  3:50 PM Franky GRADE wrote: Reason for CRM: Patient's daughter is calling because she received a Mychart message but they are unable to log into the app and she would like to know what the message was regarding and if Dr.Copland sent it. No message on file.

## 2024-06-18 ENCOUNTER — Telehealth: Payer: Self-pay | Admitting: *Deleted

## 2024-06-18 NOTE — Telephone Encounter (Unsigned)
 Copied from CRM [id number]. Topic: Clinical - Medical Advice >> Jun 18, 2024  3:48 PM Suzen RAMAN wrote: Reason for CRM: Reason for CRM: Patient daughter would like to know if the provider recommends the Flu Shot for patient. Patient currently scheduled for 06/26/24 at 3:15 pm.    CB#[phone] (M)

## 2024-06-19 NOTE — Telephone Encounter (Signed)
 [name] notified as instructed by telephone.  They will keep appointment as scheduled.

## 2024-06-19 NOTE — Telephone Encounter (Signed)
 Yes.  Anyone 88 yo should get a flu shot.

## 2024-06-26 ENCOUNTER — Ambulatory Visit (INDEPENDENT_AMBULATORY_CARE_PROVIDER_SITE_OTHER)

## 2024-06-26 DIAGNOSIS — Z23 Encounter for immunization: Secondary | ICD-10-CM | POA: Diagnosis not present

## 2024-06-29 ENCOUNTER — Other Ambulatory Visit: Payer: Self-pay | Admitting: Internal Medicine

## 2024-07-02 ENCOUNTER — Telehealth: Payer: Self-pay | Admitting: Internal Medicine

## 2024-07-02 MED ORDER — LISINOPRIL 40 MG PO TABS: 40.0000 mg | ORAL_TABLET | Freq: Every day | ORAL | 1 refills | Status: AC

## 2024-07-02 NOTE — Telephone Encounter (Signed)
 RX sent in

## 2024-07-02 NOTE — Telephone Encounter (Signed)
*  STAT* If patient is at the pharmacy, call can be transferred to refill team.   1. Which medications need to be refilled? (please list name of each medication and dose if known)   lisinopril  (ZESTRIL ) 40 MG tablet   2. Would you like to learn more about the convenience, safety, & potential cost savings by using the [facility]?   3. Are you open to using the [facility] (Type [facility]. ).  4. Which pharmacy/location (including street and city if local pharmacy) is medication to be sent to?  [facility] [address], [place] - [address]   5. Do they need a 30 day or 90 day supply? 90 day   Daughter Gerard) stated patient is almost out of this medication.

## 2024-07-03 ENCOUNTER — Telehealth: Payer: Self-pay | Admitting: *Deleted

## 2024-07-03 NOTE — Telephone Encounter (Signed)
 Please call and triage.

## 2024-07-03 NOTE — Telephone Encounter (Signed)
 Copied from CRM [id number]. Topic: Clinical - Medical Advice >> Jul 03, 2024  3:10 PM Shereese L wrote: Reason for CRM: Patient daughter is requesting a call back from the nurse asap because she's showing signs of change Please call phone#[phone]

## 2024-07-03 NOTE — Telephone Encounter (Signed)
 Yes, I know them both very well.  We will talk next week at her appointment.

## 2024-07-03 NOTE — Telephone Encounter (Signed)
 Called daughter on [place]. She wanted to let Dr. Watt know she has had some changes. She has been having memory issues and last night she woke her up crying that she couldn't go to sleep. Daughter states that she has been forgetting a lot of stuff. She has noticed that she is constantly scratching her neck a lot. There are several other little changes and saying things that is not normal for her. She is very concerned with decreased vision. She is concerned that she will not be able to take care of her if she continues to declined. She has noticed increased memory issues in the last few weeks. She is not sure if she is having any urinary symptoms. I have advised that she make follow up with PCP to review all of her concerns. She is concerned that her mom will feel like she is trying get rid of her. She would rather not talk about her concerns with mother in the room. So she would like to make sure that provider has reviewed before visit.

## 2024-07-09 NOTE — Progress Notes (Unsigned)
     Paula Qazi T. Makinsey Pepitone, MD, CAQ Sports Medicine [facility] [address] [place], [address]  Phone: [phone]  FAX: [phone]  Paula Luna - 89 y.o. female  MRN [id number]  Date of Birth: 1925-05-08  Date: 07/11/2024  PCP: Watt Mirza, MD  Referral: Watt Mirza, MD  No chief complaint on file.  Subjective:   Paula Luna is a 89 y.o. very pleasant female patient with There is no height or weight on file to calculate BMI. who presents with the following:  Discussed the use of AI scribe software for clinical note transcription with the patient, who gave verbal consent to proceed.  Patient is here with her daughter and.  She has come concerns about age-related debility and memory changes.  Patient also has some severe slight limitations.   History of Present Illness     Review of Systems is noted in the HPI, as appropriate  Objective:   There were no vitals taken for this visit.  GEN: No acute distress; alert,appropriate. PULM: Breathing comfortably in no respiratory distress PSYCH: Normally interactive.   Laboratory and Imaging Data:  Assessment and Plan:   No diagnosis found. Assessment & Plan   Medication Management during today's office visit: No orders of the defined types were placed in this encounter.  There are no discontinued medications.  Orders placed today for conditions managed today: No orders of the defined types were placed in this encounter.   Disposition: No follow-ups on file.  Dr[employer] One speech-to-text software was used for transcription in this dictation.  Possible transcriptional errors can occur using Animal nutritionist.   Signed,  Mirza DASEN. Lavon Bothwell, MD   Outpatient Encounter Medications as of 07/11/2024  Medication Sig   amLODipine  (NORVASC ) 5 MG tablet Take 1 tablet by mouth once daily   Cholecalciferol  (VITAMIN D ) 50 MCG (2000 UT) CAPS Take 1 capsule by  mouth daily.   docusate sodium  (COLACE) 100 MG capsule Take 100 mg by mouth daily.   furosemide  (LASIX ) 20 MG tablet Take 20 mg by mouth daily as needed for edema.   lisinopril  (ZESTRIL ) 40 MG tablet Take 1 tablet (40 mg total) by mouth daily.   Multiple Vitamins-Minerals (ICAPS PO) Take 1 capsule by mouth daily.   pantoprazole  (PROTONIX ) 40 MG tablet Take 1 tablet by mouth once daily   Rivaroxaban  (XARELTO ) 15 MG TABS tablet TAKE 1 TABLET BY MOUTH ONCE DAILY WITH SUPPER   No facility-administered encounter medications on file as of 07/11/2024.

## 2024-07-10 ENCOUNTER — Telehealth: Payer: Self-pay | Admitting: *Deleted

## 2024-07-10 NOTE — Telephone Encounter (Signed)
[name] notified as instructed by telephone. 

## 2024-07-10 NOTE — Telephone Encounter (Signed)
 Copied from CRM [id number]. Topic: Clinical - Medical Advice >> Jul 10, 2024 10:42 AM Adelita E wrote: Reason for CRM: Patient's daughter, Jenkins, called in stating that the patient has an appointment tomorrow and questioning if labs will get drawn then. If they do, Jenkins would like to stay back and discuss some concerns with patient's PCP, as she does not want her mom to know that she is bringing these concerns to attention. Please call Jenkins and let her know if labs will get drawn and if they can privately speak to PCP about her mother.

## 2024-07-10 NOTE — Telephone Encounter (Signed)
 Yes.  We can talk privately.

## 2024-07-11 ENCOUNTER — Encounter: Payer: Self-pay | Admitting: Family Medicine

## 2024-07-11 ENCOUNTER — Ambulatory Visit (INDEPENDENT_AMBULATORY_CARE_PROVIDER_SITE_OTHER): Admitting: Family Medicine

## 2024-07-11 VITALS — BP 120/76 | HR 56 | Temp 98.0°F | Ht <= 58 in | Wt 105.1 lb

## 2024-07-11 DIAGNOSIS — Z9181 History of falling: Secondary | ICD-10-CM | POA: Diagnosis not present

## 2024-07-11 DIAGNOSIS — H3530 Unspecified macular degeneration: Secondary | ICD-10-CM

## 2024-07-11 DIAGNOSIS — Z6379 Other stressful life events affecting family and household: Secondary | ICD-10-CM

## 2024-07-11 DIAGNOSIS — R54 Age-related physical debility: Secondary | ICD-10-CM | POA: Diagnosis not present

## 2024-07-11 LAB — POC URINALSYSI DIPSTICK (AUTOMATED)
Bilirubin, UA: NEGATIVE
Glucose, UA: NEGATIVE
Ketones, UA: NEGATIVE
Leukocytes, UA: NEGATIVE
Nitrite, UA: NEGATIVE
Protein, UA: NEGATIVE
Spec Grav, UA: 1.01 (ref 1.010–1.025)
Urobilinogen, UA: 0.2 U/dL
pH, UA: 6 (ref 5.0–8.0)

## 2024-07-12 ENCOUNTER — Encounter: Payer: Self-pay | Admitting: Family Medicine

## 2024-07-16 ENCOUNTER — Telehealth: Payer: Self-pay | Admitting: Family Medicine

## 2024-07-16 NOTE — Telephone Encounter (Signed)
 Copied from CRM [id number]. Topic: General - Call Back - No Documentation >> Jul 16, 2024  1:43 PM Rea C wrote: Reason for CRM: [facility] [employer] called to verify if faxes were received on 10/24 and 10/29 for forms for incontinence renewals.  Phone Number: [phone] Fax number: [phone]; alternate fax [phone]

## 2024-07-17 ENCOUNTER — Telehealth: Payer: Self-pay

## 2024-07-17 NOTE — Telephone Encounter (Signed)
 Copied from CRM #[id number]. Topic: General - Other >> Jul 17, 2024 11:21 AM Rea ORN wrote: Reason for CRM: Pt daughter Arlean called to follow up call from [employer]. [employer]  [employer] call yesterday and spoke with PAS Leah C regarding fax that was sent on 10/24, 10/29 and again sent on 11/4. Leah C sent a CRM to clinic to follow up. Arlean stated [employer]  [employer] reached out to her and said it is terrible that the clinic has not responded to them. This company supplies the pt's incontinence supplies. [employer]  [facility] stated the order will expire soon, 08/05/24.  Please call Arlean [phone] back to advise if fax was received by clinic.

## 2024-07-17 NOTE — Progress Notes (Signed)
 Complex Care Management Note  Care Guide Note 07/17/2024 Name: KRISTINIA LEAVY MRN: [id number] DOB: September 13, 1924  Hampton JONETTA Giles is a 89 y.o. year old female who sees Copland, Jacques, MD for primary care. I reached out to Hampton JONETTA Rodriguez by phone today to offer complex care management services.  Ms. Gatchel was given information about Complex Care Management services today including:   The Complex Care Management services include support from the care team which includes your Nurse Care Manager, Clinical Social Worker, or Pharmacist.  The Complex Care Management team is here to help remove barriers to the health concerns and goals most important to you. Complex Care Management services are voluntary, and the patient may decline or stop services at any time by request to their care team member.   Complex Care Management Consent Status: Patient agreed to services and verbal consent obtained.   Follow up plan:  Telephone appointment with complex care management team member scheduled for:  08/14/24 at 1:00 p.m.   Encounter Outcome:  Patient Scheduled  Dream[facility]  [facility], [facility] VBCI Assistant Direct Dial: [phone]  Fax: [phone]

## 2024-07-23 ENCOUNTER — Telehealth: Payer: Self-pay | Admitting: Family Medicine

## 2024-07-23 NOTE — Telephone Encounter (Signed)
 CMM form received and placed in Dr. Eleanore office in box to complete questions 19-29.

## 2024-07-23 NOTE — Telephone Encounter (Signed)
 Left message for Paula Luna to fax back CMM form to [phone] so we can complete this missing information.

## 2024-07-23 NOTE — Telephone Encounter (Signed)
 Copied from CRM [id number]. Topic: General - Call Back - No Documentation >> Jul 23, 2024  9:49 AM Alfonso ORN wrote: Reason for CRM: Adrien from [employer] called and stated incontinence renewals faxed on 10/24 for pt but  CMM imcomplete and needs questions 19-29 answered and signed  callback:[phone] fax :[phone]  alt fax: [phone]   forms expire on 11/24

## 2024-07-23 NOTE — Telephone Encounter (Unsigned)
 Copied from CRM [id number]. Topic: General - Other >> Jul 23, 2024  4:12 PM Aisha D wrote: Reason for CRM: Pt's daughter Jenkins is calling to get the status update on the forms sent from [employer]  [facility]. Jenkins stated that the forms expire on 11/24 and wants to know if they were faxed back to [facility]. Jenkins would like a callback with an update once the forms have been faxed to [employer]  [facility].

## 2024-07-24 NOTE — Telephone Encounter (Signed)
 Updated CMM form faxed back to [employer]  [facility] at [phone]. Left message for Jenkins that forms have been completed and faxed back to [facility].

## 2024-07-24 NOTE — Telephone Encounter (Signed)
Completed and placed in your box.

## 2024-08-06 ENCOUNTER — Other Ambulatory Visit: Payer: Self-pay | Admitting: Internal Medicine

## 2024-08-06 DIAGNOSIS — I4821 Permanent atrial fibrillation: Secondary | ICD-10-CM

## 2024-08-06 NOTE — Telephone Encounter (Signed)
 Prescription refill request for Xarelto  received.  Indication:afib Last office visit:5/25 Weight:47.7  kg Age:89 Scr:0.98  4/25 CrCl:24.71  ml/min  Prescription refilled

## 2024-08-14 ENCOUNTER — Other Ambulatory Visit: Payer: Self-pay | Admitting: *Deleted

## 2024-08-15 NOTE — Patient Outreach (Signed)
 Complex Care Management   Visit Note  08/15/2024  Name:  Paula Luna MRN: [id number] DOB: [birth year]  Situation: Referral received for Complex Care Management related to caregiver stress I obtained verbal consent from Caregiver.  Visit completed with Caregiver  on the phone  Background:   Past Medical History:  Diagnosis Date   CKD (chronic kidney disease) stage 3, GFR 30-59 ml/min (HCC) 10/04/2021   Hypertension 12/25/2013   Ischemic bowel disease    a. 03/2017 abd pain-->internal herina and ischemic jejunum s/p ex lap and small bowel resection.   Macular degeneration of left eye 01/06/2015   Osteoporosis    Permanent atrial fibrillation (HCC)    a. First noted 03/2017;  b. 07/2017 Echo: EF 60-65%, no rwma, mild MR, mildly to mod dil LA; c. 07/2017 48hr Holter: persistent Afib, avg HR of 66 (range 39-140 bpm);  d. CHA2DS2VASc = 4-->Xarelto  initiated 07/2017.    Assessment: Patient Reported Symptoms:  Cognitive Cognitive Status: No symptoms reported   Health Maintenance Behaviors: Annual physical exam Healing Pattern: Average Health Facilitated by: Rest  Neurological Neurological Review of Symptoms: No symptoms reported    HEENT HEENT Symptoms Reported: Sudden change or loss of vision (macular degeneration, dry eyes) HEENT Management Strategies: Routine screening HEENT Self-Management Outcome: 3 (uncertain)    Cardiovascular Cardiovascular Symptoms Reported: No symptoms reported    Respiratory Respiratory Symptoms Reported: No symptoms reported    Endocrine Endocrine Symptoms Reported: No symptoms reported Is patient diabetic?: No    Gastrointestinal Gastrointestinal Symptoms Reported: Constipation Additional Gastrointestinal Details: trouble swallowing, saw gastrologist recommended esophagus needs to be strectched but does not want to do this-uses stool softners to manage constipation      Genitourinary Genitourinary Symptoms Reported: Incontinence Genitourinary  Management Strategies: Incontinence garment/pad  Integumentary Integumentary Symptoms Reported: No symptoms reported    Musculoskeletal Musculoskelatal Symptoms Reviewed: Limited mobility Additional Musculoskeletal Details: uses a walker t        Psychosocial Psychosocial Symptoms Reported: No symptoms reported Additional Psychological Details: per daughter, patient frequently wipes and scratches skin-patients provider aware-confirmed that patient has a in home care aid for 72 hours per month, also has friends and family that will come to check on patient, daughter is patient's primary care provider, declines senior center activities/day program/facility care Behavioral Management Strategies: Coping strategies   Quality of Family Relationships: involved, supportive, stressful Do you feel physically threatened by others?: No    08/15/2024    PHQ2-9 Depression Screening   Little interest or pleasure in doing things Not at all  Feeling down, depressed, or hopeless Not at all  PHQ-2 - Total Score 0  Trouble falling or staying asleep, or sleeping too much    Feeling tired or having little energy    Poor appetite or overeating     Feeling bad about yourself - or that you are a failure or have let yourself or your family down    Trouble concentrating on things, such as reading the newspaper or watching television    Moving or speaking so slowly that other people could have noticed.  Or the opposite - being so fidgety or restless that you have been moving around a lot more than usual    Thoughts that you would be better off dead, or hurting yourself in some way    PHQ2-9 Total Score    If you checked off any problems, how difficult have these problems made it for you to do your work, take care of things at  home, or get along with other people    Depression Interventions/Treatment      There were no vitals filed for this visit.    Medications Reviewed Today     Reviewed by Ermalinda Lenn HERO, LCSW (Social Worker) on 08/14/24 at 1426  Med List Status: <None>   Medication Order Taking? Sig Documenting Provider Last Dose Status Informant  amLODipine  (NORVASC ) 5 MG tablet [id number] Yes Take 1 tablet by mouth once daily Copland, Spencer, MD  Active   Cholecalciferol  (VITAMIN D ) 50 MCG (2000 UT) CAPS [id number] Yes Take 1 capsule by mouth daily. [provider]  Active Child  docusate sodium  (COLACE) 100 MG capsule [id number] Yes Take 100 mg by mouth daily. [provider]  Active Child  furosemide  (LASIX ) 20 MG tablet [id number] Yes Take 20 mg by mouth daily as needed for edema. [provider]  Active   lisinopril  (ZESTRIL ) 40 MG tablet [id number] Yes Take 1 tablet (40 mg total) by mouth daily. End, Lonni, MD  Active   Multiple Vitamins-Minerals (ICAPS PO) [id number] Yes Take 1 capsule by mouth daily. [provider]  Active   pantoprazole  (PROTONIX ) 40 MG tablet [id number] Yes Take 1 tablet by mouth once daily Copland, Spencer, MD  Active   Rivaroxaban  (XARELTO ) 15 MG TABS tablet [id number] Yes TAKE 1 TABLET BY MOUTH ONCE DAILY WITH SUPPER End, Lonni, MD  Active             Recommendation:   PCP Follow-up Specialty provider follow-up as scheduled [facility] referral for enhanced in home care  Follow Up Plan:   Telephone follow up appointment date/time:  09/10/24 1pm  Jasyn Mey, LCSW [place] [facility]  [facility], [facility] Licensed Clinical Social Worker  Direct Dial: [phone]

## 2024-08-15 NOTE — Patient Instructions (Signed)
 Visit Information  Thank you for taking time to visit with me today. Please don't hesitate to contact me if I can be of assistance to you before our next scheduled appointment.  Our next appointment is by telephone on 09/10/24 at 1pm  Please call the care guide team at [phone] if you need to cancel or reschedule your appointment.   Following is a copy of your care plan   VBCI Social Work Care Plan       Problems:   Respite care and enhanced in home care Commumity   CSW Clinical Goal(s):   Over the next 90 days the Caregiver will work with the Department of Social Services [facility]  to address needs related to need for enhanced in home care support needs. Over the next 30 days patient's daughter will complete consent form to start process for CAP services  Over the next 30 days patient's daughter will complete referral for respite care through [facility]  Interventions:  Mental Health:  Evaluation of current treatment plan related to caregiver stress Active listening / Reflection utilized Caregiver stress acknowledged : Consideration of in-home help encouraged : options discussed related to CAP services(enhanced case management), application process discussed, referral completed Emotional Support Provided Motivational Interviewing employed Solution-Focused Strategies employed:emphasized self care and boundary setting  Patient Goals/Self-Care Activities:  Continue taking your medication as prescribed.   Complete consent forms for the [facility] CAP  Patient's daughter will complete referral for respite care through [facility] Plan:   Telephone follow up appointment with care management team member scheduled for:  09/10/24 1pm         Please call the Suicide and Crisis Lifeline: 988 call the [place]  National Suicide Prevention Lifeline: (319)570-9736 or TTY: (724)175-6899 TTY (713)758-2426) to  talk to a trained counselor call 1-800-273-TALK (toll free, 24 hour hotline) call 911 if you are experiencing a Mental Health or Behavioral Health Crisis or need someone to talk to.  Daughter verbalized understanding of Care plan and visit instructions communicated this visit  Clemie General, LCSW [place]  [facility], [facility] Licensed Clinical Social Worker  Direct Dial: [phone]

## 2024-08-19 ENCOUNTER — Telehealth: Payer: Self-pay | Admitting: *Deleted

## 2024-08-19 NOTE — Patient Outreach (Signed)
 Phone call from patient's daughter, stating that she has contacted the [employer] and has left a message regarding their respite care program. Caregiver stress continues to be acknowledged. Discussed referral; placed for CAP services. Explored possible options for additional assistance through family and friends. Also discussed placement options. Self care emphasized. Follow up appointment scheduled for 09/10/24 at 1pm.   Zarra Geffert, LCSW [place]  [facility], [facility] Licensed Clinical Social Worker  Direct Dial: [phone]

## 2024-09-09 ENCOUNTER — Telehealth: Payer: Self-pay

## 2024-09-09 NOTE — Telephone Encounter (Signed)
 Copied from CRM #[id number]. Topic: General - Call Back - No Documentation >> Sep 09, 2024  9:28 AM Olam RAMAN wrote: Reason for RMF:[id number], ann  would like to speak to nurse about pt health and is upset about having so much to do and is exhausted and would like some assistance

## 2024-09-09 NOTE — Telephone Encounter (Signed)
 FL-2 completed and placed on your desk.

## 2024-09-09 NOTE — Telephone Encounter (Signed)
 Spoke to pt's daughter, Jenkins, per [place]. She is getting home health aide through Medicaid on the days she is working. She is seeing active mental decline. Forgetting things she has said or already done. She is going to start looking for skilled nursing facilities that may have space. In the meantime, can a FL2 go ahead and be started so it can be ready if she finds a place. I discussed that she will probably need a PPD or Quantiferon-Gold test done, but we can work on that when needed. We could go ahead and order the lab in future orders and they can make a lab visit. I am printing out a FL2 for Dr Watt and putting it in his in box in his office. She is aware they are only good for 30 days.

## 2024-09-09 NOTE — Telephone Encounter (Addendum)
 Will hold on to FL-2 form until Jenkins has decided on a facility.

## 2024-09-10 ENCOUNTER — Telehealth: Payer: Self-pay | Admitting: *Deleted

## 2024-09-10 ENCOUNTER — Other Ambulatory Visit: Payer: Self-pay | Admitting: Family Medicine

## 2024-09-13 ENCOUNTER — Other Ambulatory Visit: Payer: Self-pay | Admitting: Family Medicine

## 2024-09-13 ENCOUNTER — Telehealth: Payer: Self-pay

## 2024-09-13 MED ORDER — IPRATROPIUM BROMIDE 0.06 % NA SOLN: 2.0000 | Freq: Four times a day (QID) | NASAL | 3 refills | Status: AC

## 2024-09-13 NOTE — Addendum Note (Signed)
 Addended by: WENDELL ARLAND RAMAN on: 09/13/2024 05:07 PM   Modules accepted: Orders

## 2024-09-13 NOTE — Telephone Encounter (Signed)
 Copied from CRM [id number]. Topic: Clinical - Medication Refill >> Sep 13, 2024  2:00 PM Victoria A wrote: Medication: Atrovent   Has the patient contacted their pharmacy? No (Agent: If no, request that the patient contact the pharmacy for the refill. If patient does not wish to contact the pharmacy document the reason why and proceed with request.) (Agent: If yes, when and what did the pharmacy advise?)  This is the patient's preferred pharmacy:   [phone] [place] - [address] [place] [address] Phone: [phone] Fax: [phone]  Is this the correct pharmacy for this prescription? Yes If no, delete pharmacy and type the correct one.   Has the prescription been filled recently? No  Is the patient out of the medication? Yes  Has the patient been seen for an appointment in the last year OR does the patient have an upcoming appointment? Yes  Can we respond through MyChart? Yes  Agent: Please be advised that Rx refills may take up to 3 business days. We ask that you follow-up with your pharmacy.

## 2024-09-13 NOTE — Telephone Encounter (Signed)
 Spoke with Jenkins.  She would like to go ahead and get a refill on the Atrovent  Nasal Spray.  Rx sent to [facility] on [address]  [name] did state that she called [facility] [employer] today about possible placement for Ms. Calvillo. She is waiting on them to call her back.  I did let her know that we have the FL-2 form ready for when she needs it.

## 2024-09-13 NOTE — Telephone Encounter (Signed)
 Left message for Jenkins with below comments from Dr. Watt.  I ask her to call be back if they need a refill on the Atrovent  nasal spray.

## 2024-09-13 NOTE — Telephone Encounter (Signed)
 Copied from CRM #[id number]. Topic: Clinical - Medical Advice >> Sep 13, 2024 10:19 AM Victoria A wrote: Reason for CRM: Patient's daughter Jenkins called and said that she wants to know if there can be a prescription to help patient with runny nose that is constant. Jenkins said patient is not sick but nose is always running. Jenkins would like for [id number],  - [address] [place] [address] Phone: [phone] Fax: [phone] Hours: Not open 24 hours

## 2024-09-16 NOTE — Telephone Encounter (Signed)
 Refill was sent on 09/13/2024.

## 2024-09-18 ENCOUNTER — Ambulatory Visit: Admitting: Podiatry

## 2024-09-18 DIAGNOSIS — B351 Tinea unguium: Secondary | ICD-10-CM | POA: Diagnosis not present

## 2024-09-18 DIAGNOSIS — D689 Coagulation defect, unspecified: Secondary | ICD-10-CM

## 2024-09-18 DIAGNOSIS — M79671 Pain in right foot: Secondary | ICD-10-CM | POA: Diagnosis not present

## 2024-09-18 DIAGNOSIS — M79672 Pain in left foot: Secondary | ICD-10-CM | POA: Diagnosis not present

## 2024-09-18 NOTE — Progress Notes (Signed)
 Presents today with her daughter for chief complaint of painful elongated toenails bilaterally.  She states that the left hallux hurts her.  Objective: Pulses are stable alert oriented 3 left hallux nail plate is thicker and longer than the rest appear to be pushed down aspect of the nailbed.  There is no erythema cellulitis drainage or odor but tender on palpation.  Otherwise all nails are long thick dystrophic with mycotic and incurvated.  Assessment: Pain limited onychomycosis 1 through 5 bilateral.  Plan: Debridement toenails 1 through 5.

## 2024-09-26 ENCOUNTER — Telehealth: Payer: Self-pay | Admitting: Family Medicine

## 2024-09-26 NOTE — Telephone Encounter (Unsigned)
 Copied from CRM [id number]. Topic: Appointments - Scheduling Inquiry for Clinic >> Sep 25, 2024  4:21 PM Thersia BROCKS wrote: Reason for CRM: Patient daughter called in regarding getting rescheduled annual medicare was unable to get her scheduled would like a callback

## 2024-10-16 ENCOUNTER — Encounter: Payer: Self-pay | Admitting: Internal Medicine

## 2024-10-16 ENCOUNTER — Ambulatory Visit: Admitting: Internal Medicine

## 2024-10-16 VITALS — BP 130/80 | HR 87 | Ht <= 58 in | Wt 107.0 lb

## 2024-10-16 DIAGNOSIS — I10 Essential (primary) hypertension: Secondary | ICD-10-CM

## 2024-10-16 DIAGNOSIS — I4821 Permanent atrial fibrillation: Secondary | ICD-10-CM

## 2024-10-16 DIAGNOSIS — R600 Localized edema: Secondary | ICD-10-CM | POA: Diagnosis not present

## 2024-10-16 DIAGNOSIS — Z79899 Other long term (current) drug therapy: Secondary | ICD-10-CM

## 2024-10-16 NOTE — Progress Notes (Unsigned)
 " Cardiology Office Note:  .   Date:  10/17/2024  ID:  Paula Luna, DOB 10/11/1924, MRN [id number] PCP: Watt Mirza, MD  [facility] Providers Cardiologist:  Lonni Hanson, MD     History of Present Illness: .   Paula Luna is a 89 y.o. female with history of permanent atrial fibrillation, hypertension, ischemic bowel status post resection with subsequent hospitalization for small bowel obstruction, osteoporosis, and anemia, who presents for follow-up of atrial fibrillation and hypertension.  She was last seen in our office in 01/2024 by Medford Meager, NP, at which time she reported continued intermittent orthostatic lightheadedness as well as dysphagia.  She had a mechanical fall in early May during which she struck her back and had some residual back pain.  No medication changes or additional testing were pursued.  Today, Paula Luna reports that she is feeling okay though her daughter is concerned about increased leg swelling and redness.  This has made it more difficult for her to get her socks on.  She only uses her furosemide  once or twice a month, having last taken it 2 to 3 days ago.  She denies chest pain.  She only gets out of breath when she is nervous.  She has intermittent palpitations, which have been present for years.  She notes quite a few headaches for which she takes aspirin for relief.  She remains compliant with rivaroxaban .  She has quite a bit of clear rhinorrhea as well that only responds to ipratropium nasal spray.  ROS: See HPI  Studies Reviewed: SABRA   EKG Interpretation Date/Time:  Wednesday October 16 2024 13:29:25 EST Ventricular Rate:  87 PR Interval:    QRS Duration:  66 QT Interval:  358 QTC Calculation: 430 R Axis:   -13  Text Interpretation: Atrial fibrillation Septal infarct (cited on or before 02-Apr-2021) Abnormal ECG When compared with ECG of 17-Jan-2024 11:48, No significant change was found Confirmed by Rina Adney, Lonni [phone]) on  10/17/2024 2:08:38 PM    Risk Assessment/Calculations:    CHA2DS2-VASc Score = 5   This indicates a 7.2% annual risk of stroke. The patient's score is based upon: CHF History: 0 HTN History: 1 Diabetes History: 0 Stroke History: 0 Vascular Disease History: 1 Age Score: 2 Gender Score: 1            Physical Exam:   VS:  BP 130/80 (BP Location: Left Arm, Patient Position: Sitting, Cuff Size: Small)   Pulse 87 Comment: 52 oximeter  Ht 4' 10 (1.473 m)   Wt 107 lb (48.5 kg)   SpO2 97%   BMI 22.36 kg/m    Wt Readings from Last 3 Encounters:  10/16/24 107 lb (48.5 kg)  07/11/24 105 lb 2 oz (47.7 kg)  04/24/24 104 lb 8 oz (47.4 kg)    General:  NAD. Neck: No JVD or HJR. Lungs: Clear to auscultation bilaterally without wheezes or crackles. Heart: Irregularly irregular rhythm with 1/6 systolic murmur. Abdomen: Soft, nontender, nondistended. Extremities: 1+ pretibial edema bilaterally.  ASSESSMENT AND PLAN: .    Permanent atrial fibrillation: Paula Luna is minimally symptomatic with adequate rate control off AV nodal blocking agents.  Continue rivaroxaban  15 mg daily based on creatinine clearance less than 50.  I will check a CBC and BMP today.  Lower extremity edema: This has been chronic but seems a little worse than baseline.  I have encouraged Paula Luna to take furosemide  20 mg for 3 to 4 days, during which time  her edema will hopefully resolve.  I also encouraged compression stocking use and leg elevation when possible.  We will check a BMP today.  Hypertension: Blood pressure upper normal today.  Continue lisinopril  and amlodipine .  If leg swelling persists, we will need to consider de-escalation or discontinuation of amlodipine .    Dispo: Return to clinic in 3 months.  Signed, Lonni Hanson, MD  "

## 2024-10-16 NOTE — Patient Instructions (Addendum)
 Medication Instructions:  Your physician recommends the following medication changes.  INCREASE:  Lasix  20 mg by mouth daily for 3-4 days, then resume taking it as needed.  *If you need a refill on your cardiac medications before your next appointment, please call your pharmacy*  Lab Work: Your provider would like for you to have following labs drawn today CBC, CMP.     Testing/Procedures: No test ordered today   Follow-Up: At [facility], you and your health needs are our priority.  As part of our continuing mission to provide you with exceptional heart care, our providers are all part of one team.  This team includes your primary Cardiologist (physician) and Advanced Practice Providers or APPs (Physician Assistants and Nurse Practitioners) who all work together to provide you with the care you need, when you need it.  Your next appointment:   3 month(s)  Provider:   You may see Lonni Hanson, MD or one of the following Advanced Practice Providers on your designated Care Team:   Lonni Meager, NP Lesley Maffucci, PA-C Bernardino Bring, PA-C Cadence Avon, PA-C Tylene Lunch, NP Barnie Hila, NP

## 2024-10-17 ENCOUNTER — Encounter: Payer: Self-pay | Admitting: Internal Medicine

## 2024-10-17 ENCOUNTER — Ambulatory Visit: Payer: Self-pay | Admitting: Internal Medicine

## 2024-10-17 ENCOUNTER — Telehealth: Payer: Self-pay | Admitting: *Deleted

## 2024-10-17 LAB — BASIC METABOLIC PANEL WITH GFR
BUN/Creatinine Ratio: 14 (ref 12–28)
BUN: 15 mg/dL (ref 10–36)
CO2: 18 mmol/L — ABNORMAL LOW (ref 20–29)
Calcium: 9.9 mg/dL (ref 8.7–10.3)
Chloride: 105 mmol/L (ref 96–106)
Creatinine, Ser: 1.04 mg/dL — ABNORMAL HIGH (ref 0.57–1.00)
Glucose: 76 mg/dL (ref 70–99)
Potassium: 5.2 mmol/L (ref 3.5–5.2)
Sodium: 145 mmol/L — ABNORMAL HIGH (ref 134–144)
eGFR: 49 mL/min/{1.73_m2} — ABNORMAL LOW

## 2024-10-17 LAB — CBC
Hematocrit: 39.2 % (ref 34.0–46.6)
Hemoglobin: 12.8 g/dL (ref 11.1–15.9)
MCH: 25.8 pg — ABNORMAL LOW (ref 26.6–33.0)
MCHC: 32.7 g/dL (ref 31.5–35.7)
MCV: 79 fL (ref 79–97)
Platelets: 179 10*3/uL (ref 150–450)
RBC: 4.96 x10E6/uL (ref 3.77–5.28)
RDW: 15.4 % (ref 11.7–15.4)
WBC: 7 10*3/uL (ref 3.4–10.8)

## 2024-10-17 NOTE — Telephone Encounter (Signed)
 See result note.  Lonni Hanson, MD [facility]

## 2024-10-17 NOTE — Telephone Encounter (Signed)
 Labs were done by Dr. Mady, Cardiologist.  Will forward message to him.

## 2024-10-17 NOTE — Telephone Encounter (Signed)
 Copied from CRM [id number]. Topic: Clinical - Lab/Test Results >> Oct 17, 2024  2:27 PM Charolett L wrote: Reason for CRM: Patients daughter called in to request a call back to go over lab results  CB# [phone]

## 2024-10-17 NOTE — Telephone Encounter (Signed)
 The patient's daughter has been notified of the result along with recommendations. Pt's daughter verbalized understanding. All questions (if any) were answered

## 2024-11-22 ENCOUNTER — Ambulatory Visit

## 2024-11-27 ENCOUNTER — Encounter: Admitting: Family Medicine

## 2024-12-18 ENCOUNTER — Ambulatory Visit: Admitting: Podiatry

## 2025-01-29 ENCOUNTER — Ambulatory Visit: Admitting: Internal Medicine

## 2025-02-19 ENCOUNTER — Encounter: Admitting: Family Medicine

## 2025-02-19 ENCOUNTER — Ambulatory Visit
